# Patient Record
Sex: Male | Born: 1940 | Race: White | Hispanic: No | Marital: Married | Smoking: Former smoker
Health system: Southern US, Community
[De-identification: ages and names within clinical notes are randomized; demographics above are authoritative.]

## PROBLEM LIST (undated history)

## (undated) DIAGNOSIS — K222 Esophageal obstruction: Secondary | ICD-10-CM

## (undated) DIAGNOSIS — R7303 Prediabetes: Secondary | ICD-10-CM

## (undated) DIAGNOSIS — K449 Diaphragmatic hernia without obstruction or gangrene: Secondary | ICD-10-CM

## (undated) DIAGNOSIS — E039 Hypothyroidism, unspecified: Secondary | ICD-10-CM

## (undated) DIAGNOSIS — D649 Anemia, unspecified: Secondary | ICD-10-CM

## (undated) DIAGNOSIS — E559 Vitamin D deficiency, unspecified: Secondary | ICD-10-CM

## (undated) DIAGNOSIS — L97309 Non-pressure chronic ulcer of unspecified ankle with unspecified severity: Secondary | ICD-10-CM

## (undated) DIAGNOSIS — E538 Deficiency of other specified B group vitamins: Secondary | ICD-10-CM

## (undated) DIAGNOSIS — J449 Chronic obstructive pulmonary disease, unspecified: Secondary | ICD-10-CM

## (undated) DIAGNOSIS — M1990 Unspecified osteoarthritis, unspecified site: Secondary | ICD-10-CM

## (undated) DIAGNOSIS — K900 Celiac disease: Secondary | ICD-10-CM

## (undated) DIAGNOSIS — Z8719 Personal history of other diseases of the digestive system: Secondary | ICD-10-CM

## (undated) DIAGNOSIS — K219 Gastro-esophageal reflux disease without esophagitis: Secondary | ICD-10-CM

## (undated) DIAGNOSIS — R0602 Shortness of breath: Secondary | ICD-10-CM

## (undated) DIAGNOSIS — I10 Essential (primary) hypertension: Secondary | ICD-10-CM

## (undated) HISTORY — DX: Essential (primary) hypertension: I10

## (undated) HISTORY — DX: Prediabetes: R73.03

## (undated) HISTORY — PX: EYE SURGERY: SHX253

## (undated) HISTORY — DX: Anemia, unspecified: D64.9

## (undated) HISTORY — DX: Esophageal obstruction: K22.2

## (undated) HISTORY — DX: Diaphragmatic hernia without obstruction or gangrene: K44.9

## (undated) HISTORY — PX: APPENDECTOMY: SHX54

## (undated) HISTORY — DX: Unspecified osteoarthritis, unspecified site: M19.90

## (undated) HISTORY — DX: Deficiency of other specified B group vitamins: E53.8

## (undated) HISTORY — PX: BACK SURGERY: SHX140

## (undated) HISTORY — DX: Vitamin D deficiency, unspecified: E55.9

## (undated) HISTORY — PX: HIATAL HERNIA REPAIR: SHX195

## (undated) HISTORY — PX: TONSILLECTOMY: SUR1361

## (undated) SURGERY — Surgical Case
Anesthesia: *Unknown

---
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
## 1997-12-18 ENCOUNTER — Ambulatory Visit (HOSPITAL_COMMUNITY): Admission: RE | Admit: 1997-12-18 | Discharge: 1997-12-18 | Payer: Self-pay | Admitting: Internal Medicine

## 2002-01-21 ENCOUNTER — Encounter: Payer: Self-pay | Admitting: Orthopaedic Surgery

## 2002-01-21 ENCOUNTER — Encounter: Admission: RE | Admit: 2002-01-21 | Discharge: 2002-01-21 | Payer: Self-pay | Admitting: Orthopaedic Surgery

## 2002-01-24 ENCOUNTER — Encounter: Payer: Self-pay | Admitting: Orthopaedic Surgery

## 2002-01-29 ENCOUNTER — Encounter (INDEPENDENT_AMBULATORY_CARE_PROVIDER_SITE_OTHER): Payer: Self-pay | Admitting: *Deleted

## 2002-01-29 ENCOUNTER — Encounter: Payer: Self-pay | Admitting: Orthopaedic Surgery

## 2002-01-29 ENCOUNTER — Ambulatory Visit (HOSPITAL_COMMUNITY): Admission: RE | Admit: 2002-01-29 | Discharge: 2002-01-30 | Payer: Self-pay | Admitting: Orthopaedic Surgery

## 2003-11-11 ENCOUNTER — Ambulatory Visit (HOSPITAL_COMMUNITY): Admission: RE | Admit: 2003-11-11 | Discharge: 2003-11-11 | Payer: Self-pay | Admitting: Anesthesiology

## 2004-01-06 ENCOUNTER — Ambulatory Visit (HOSPITAL_COMMUNITY): Admission: RE | Admit: 2004-01-06 | Discharge: 2004-01-07 | Payer: Self-pay | Admitting: Orthopaedic Surgery

## 2004-01-06 ENCOUNTER — Encounter (INDEPENDENT_AMBULATORY_CARE_PROVIDER_SITE_OTHER): Payer: Self-pay | Admitting: *Deleted

## 2005-08-29 ENCOUNTER — Ambulatory Visit: Admission: RE | Admit: 2005-08-29 | Discharge: 2005-08-29 | Payer: Self-pay | Admitting: Orthopaedic Surgery

## 2006-02-09 ENCOUNTER — Ambulatory Visit (HOSPITAL_COMMUNITY): Admission: RE | Admit: 2006-02-09 | Discharge: 2006-02-09 | Payer: Self-pay | Admitting: Orthopaedic Surgery

## 2006-02-15 ENCOUNTER — Inpatient Hospital Stay (HOSPITAL_COMMUNITY): Admission: RE | Admit: 2006-02-15 | Discharge: 2006-02-16 | Payer: Self-pay | Admitting: Orthopaedic Surgery

## 2006-02-15 ENCOUNTER — Encounter (INDEPENDENT_AMBULATORY_CARE_PROVIDER_SITE_OTHER): Payer: Self-pay | Admitting: Specialist

## 2007-02-22 ENCOUNTER — Ambulatory Visit: Payer: Self-pay | Admitting: Vascular Surgery

## 2007-02-22 ENCOUNTER — Ambulatory Visit: Admission: RE | Admit: 2007-02-22 | Discharge: 2007-02-22 | Payer: Self-pay | Admitting: Orthopaedic Surgery

## 2007-02-22 ENCOUNTER — Encounter (INDEPENDENT_AMBULATORY_CARE_PROVIDER_SITE_OTHER): Payer: Self-pay | Admitting: Orthopaedic Surgery

## 2010-04-12 HISTORY — PX: JOINT REPLACEMENT: SHX530

## 2010-05-10 ENCOUNTER — Inpatient Hospital Stay (HOSPITAL_COMMUNITY): Admission: RE | Admit: 2010-05-10 | Discharge: 2010-05-13 | Payer: Self-pay | Admitting: Orthopaedic Surgery

## 2010-08-22 LAB — BASIC METABOLIC PANEL
BUN: 9 mg/dL (ref 6–23)
CO2: 29 mEq/L (ref 19–32)
CO2: 30 mEq/L (ref 19–32)
Calcium: 8 mg/dL — ABNORMAL LOW (ref 8.4–10.5)
Chloride: 95 mEq/L — ABNORMAL LOW (ref 96–112)
Chloride: 96 mEq/L (ref 96–112)
Creatinine, Ser: 0.95 mg/dL (ref 0.4–1.5)
Creatinine, Ser: 1.02 mg/dL (ref 0.4–1.5)
GFR calc Af Amer: >60 mL/min (ref >60)
GFR calc non Af Amer: >60 mL/min (ref >60)
Glucose, Bld: 128 mg/dL — ABNORMAL HIGH (ref 70–99)
Potassium: 2.8 mEq/L — ABNORMAL LOW (ref 3.5–5.1)
Potassium: 3.6 mEq/L (ref 3.5–5.1)
Sodium: 132 mEq/L — ABNORMAL LOW (ref 135–145)

## 2010-08-22 LAB — CBC
HCT: 26.4 % — ABNORMAL LOW (ref 39.0–52.0)
Hemoglobin: 8.6 g/dL — ABNORMAL LOW (ref 13.0–17.0)
MCHC: 34.5 g/dL (ref 30.0–36.0)
MCV: 87.1 fL (ref 78.0–100.0)
RDW: 15.4 % (ref 11.5–15.5)
RDW: 16 % — ABNORMAL HIGH (ref 11.5–15.5)
WBC: 5.5 10*3/uL (ref 4.0–10.5)
WBC: 5.8 10*3/uL (ref 4.0–10.5)

## 2010-08-22 LAB — PROTIME-INR: INR: 1.57 — ABNORMAL HIGH (ref 0.00–1.49)

## 2010-08-23 LAB — CBC
HCT: 34.2 % — ABNORMAL LOW (ref 39.0–52.0)
Hemoglobin: 11.8 g/dL — ABNORMAL LOW (ref 13.0–17.0)
Hemoglobin: 9.8 g/dL — ABNORMAL LOW (ref 13.0–17.0)
MCV: 88.4 fL (ref 78.0–100.0)
MCV: 89.4 fL (ref 78.0–100.0)
Platelets: 172 10*3/uL (ref 150–400)
RDW: 15.9 % — ABNORMAL HIGH (ref 11.5–15.5)
WBC: 6.1 10*3/uL (ref 4.0–10.5)

## 2010-08-23 LAB — BASIC METABOLIC PANEL
BUN: 8 mg/dL (ref 6–23)
CO2: 32 mEq/L (ref 19–32)
Calcium: 8.4 mg/dL (ref 8.4–10.5)
Calcium: 8.6 mg/dL (ref 8.4–10.5)
Chloride: 99 mEq/L (ref 96–112)
GFR calc Af Amer: >60 mL/min (ref >60)
GFR calc Af Amer: >60 mL/min (ref >60)
GFR calc non Af Amer: >60 mL/min (ref >60)
GFR calc non Af Amer: >60 mL/min (ref >60)
Glucose, Bld: 104 mg/dL — ABNORMAL HIGH (ref 70–99)
Glucose, Bld: 95 mg/dL (ref 70–99)
Potassium: 3.3 mEq/L — ABNORMAL LOW (ref 3.5–5.1)
Sodium: 138 mEq/L (ref 135–145)

## 2010-08-23 LAB — PROTIME-INR: Prothrombin Time: 15.6 seconds — ABNORMAL HIGH (ref 11.6–15.2)

## 2010-10-28 NOTE — H&P (Signed)
NAME:  [name] Fernandez, [name] Fernandez NO.:  [id number]   MEDICAL RECORD NO.:  [id number]                   PATIENT TYPE:  OIB   LOCATION:  NA                                   FACILITY:  [facility]   PHYSICIAN:  Rennis Harding, M.D.                     DATE OF BIRTH:  28-Oct-1940   DATE OF ADMISSION:  DATE OF DISCHARGE:                                HISTORY & PHYSICAL   CHIEF COMPLAINT:  Pain in my back.   HISTORY OF PRESENT ILLNESS:  This 70 year old white male was cutting his  grass at home when he had a coughing and sneezing spell. During one of these  spells, he had immediate pain into his upper mid back. This was quite  unrelenting and took his breath away.  A little later, he had a similar  episode with increased pain. Seen by Dr. Patrice Paradise who had high suspicion of  another compression fracture of his thoracic spine. He had a fracture of T10  done over the last year from a fall. A kyphoplasty procedure was performed  at that time. Since then, he has had continuing pain into his back from that  T10 fracture and now is being seen by Dr. Hardin Negus for pain control. X-rays  show wedging of a compression fracture of the T8 level. No retropulsion of  bone is seen on MRI.   PAST MEDICAL HISTORY:  The gentleman has been in relatively good health. Dr.  Melford Aase is his family physician and has cleared him for surgery.   ALLERGIES:  Allergy to SURGICAL TAPE.   CURRENT MEDICATIONS:  1. Nexium 40 mg one q.d.  2. OxyContin 40 mg q.8h.  3. Morphine 30 mg tablets every four hours p.r.n. breakthrough.   PAST SURGICAL HISTORY:  1. Bilateral herniorrhaphies in 1950.  2. Repeat hiatal hernia in 1996, 1998, 1980, and 1988 repair.  3. Kyphoplasty T10 January 29, 2002.   SOCIAL HISTORY:  The patient is married, retired. He has no intake of  alcohol or tobacco problems. He has two children.   FAMILY HISTORY:  Noncontributory.   REVIEW OF SYSTEMS:  CENTRAL NERVOUS SYSTEM:  No seizure  ___________  paralysis, numbness, or double vision. RESPIRATORY:  No productive cough. No  hemoptysis. No shortness of breath. CARDIOVASCULAR:  No chest pain. No  angina. No orthopnea. GASTROINTESTINAL:  No nausea, vomiting, melena, or  bloody stools. GENITOURINARY:  No discharge, dysuria, or hematuria.  MUSCULOSKELETAL:  Primarily in presence illness.   PHYSICAL EXAMINATION:  GENERAL:  Alert, cooperative, friendly 70 year old  white male who vital signs are:  Blood pressure 120/62, pulse 72,  respirations 12.  HEENT:  Normocephalic, atraumatic. PERRLA. EOM intact. Oropharynx clear.  CHEST:  Clear to auscultation. No rhonchi. No rales; however, he has pain  with inspiration.  HEART:  Regular rate and rhythm. No murmurs are heard.  ABDOMEN:  Soft, nontender,  liver and spleen not felt.  GENITOURINARY/RECTAL:  Not done, not pertinent to present illness.  EXTREMITIES:  No deformities and no sensory deficit.   ADMISSION DIAGNOSES:  1. Compression fracture, T8, acute.  2. TAPE ALLERGY.  3. Hiatal hernia.   PLAN:  The patient will undergo kyphoplasty of T8. Should have any medical  problems during this short hospital stay, we will certainly ask Dr. Melford Aase  to follow along with his during this patient's hospitalization.      Dooley L. Vanita Ingles.                 Rennis Harding, M.D.    DLU/MEDQ  D:  12/31/2003  T:  12/31/2003  Job:  [id number]   cc:   Unk Pinto, M.D.  [address] [place]  [place], [place] [address]  Fax: [phone]

## 2010-10-28 NOTE — Op Note (Signed)
NAME:  [name] Fernandez, [name] Fernandez                          ACCOUNT NO.:  [id number]   MEDICAL RECORD NO.:  [id number]                   PATIENT TYPE:  OIB   LOCATION:  5014                                 FACILITY:  [facility]   PHYSICIAN:  Rennis Harding, M.D.                     DATE OF BIRTH:  22-Dec-1940   DATE OF PROCEDURE:  01/06/2004  DATE OF DISCHARGE:                                 OPERATIVE REPORT   PREOPERATIVE DIAGNOSIS:  Thoracic 8 fracture.   POSTOPERATIVE DIAGNOSIS:   OPERATION/PROCEDURE:  1. Thoracic 8 kyphoplasty.  2. Deep bone biopsy, thoracic 8.  3. Fluoroscopic imaging used for kyphoplasty, thoracic 8.   SURGEON:  Rennis Harding, M.D.   ASSISTANT:  Colleen Mahar, P. A.   ANESTHESIA:  General endotracheal.   COMPLICATIONS:  None.   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  He has been in  pain management.  Recently he has developed worsening mid thoracic back  pain.  His pain management physician, Dr. Hardin Negus, order an MRI scan of the  thoracic spine, which demonstrated a pathologic fracture at T8.  Because of  his severe pain unresponsive to all conservative care he has elected to  undergo biopsy and kyphoplasty at T8 in hopes of improving his symptoms and  obtaining tissue for definitive diagnosis.   DESCRIPTION OF OPERATION:  The patient was properly identified in the  holding area and was brought to the operating room.  He underwent general  endotracheal anesthesia without difficulty.  _______ prophylactic IV in two  pockets.  He was carefully turned prone onto the [facility] table.  Bolsters  were placed under his hips and pelvis to elicit postural reduction of the  kyphosis.  Back was prepped and draped in the usual sterile fashion.  AP and  lateral fluoroscopic images were obtained of the T8 vertebral body.  Jamshidi needle was used to cannulate the left T8 pedicle with a small stab  incision.  This was done under live AP and lateral fluoroscopy.  A guidewire  was placed through the Jamshidi needle and a working cannula established  into the T8 vertebral body.  Bone biopsy was obtained and sent to pathology.  We then placed the intervertebral bone tamp through the working cannula.  The balloon was inflated up to 3 mL.  The pressure was 250 psi and ________.  The balloon tamp was removed.   We then pushed a partially hard methyl methacrylate mix through the working  cannula into the void created by the balloon tamp.  We used a total of 3 mL  of cement.  We then removed the working cannula.  Final AP and lateral  images were saved.  A single nylon suture was used to approximate the skin  edges.  Sterile dressing applied.   The patient was  turned supine, extubated without difficulty and transferred  to the recovery room in stable condition.  He was moving his upper and lower  extremities.                                               Rennis Harding, M.D.    MC/MEDQ  D:  01/06/2004  T:  01/07/2004  Job:  [id number]

## 2010-10-28 NOTE — Op Note (Signed)
NAME:  [name] Fernandez, [name] Fernandez NO.:  [id number]   MEDICAL RECORD NO.:  [id number]          PATIENT TYPE:  INP   LOCATION:  5041                         FACILITY:  [facility]   PHYSICIAN:  Rennis Harding, M.D.        DATE OF BIRTH:  1940-07-23   DATE OF PROCEDURE:  02/15/2006  DATE OF DISCHARGE:                                 OPERATIVE REPORT   DIAGNOSIS:  T11 compression fracture.   PROCEDURES:  1. T11 kyphoplasty using fluoroscopy.  2. Bone biopsy, T11 vertebral body.   SURGEON:  Rennis Harding, MD   ASSISTANT:  None.   ANESTHESIA:  General endotracheal.   ESTIMATED BLOOD LOSS:  None.   COMPLICATIONS:  None.   INDICATIONS:  The patient is a pleasant 70 year old male with severe  thoracolumbar back pain.  He has had an MRI scan, which shows a subacute  compression fracture of T11.  He has a history of previous compression  fractures, treated with kyphoplasty.  He has chronic pain and is managed on  narcotics.  Because of the acute severe nature of his pain, which has not  responded to progressive narcotics and routine conservative measures  including bracing, he now presents for kyphoplasty and biopsy to rule out  pathologic fracture.  Risk and benefits were reviewed.   PROCEDURE:  The patient was identified in the holding area after informed  consent and taken to the operating room.  He underwent general endotracheal  anesthesia without difficulty, given prophylactic IV antibiotics.  Carefully  turned prone with bolsters placed under his hips and chest to bring about  postural reduction of the kyphosis.  The back was prepped and draped in the  usual sterile fashion.  Biplanar fluoroscopy was brought into the field,  true AP and lateral views of the T11 vertebral body obtained.  A Jamshidi  needle was used to cannulate the left T11 pedicle.  A guidewire was placed  through the Jamshidi needle and a working cannula established into the bone.  We then took a bone biopsy and  sent this to pathology.  Next we inserted an  intervertebral bone tamp into the vertebral body.  The tamp was inflated to  6 mL.  The pressure remained around 100 PSI.  We observed a partial  reduction of the vertebral endplates.  We then removed the bone tamp and  pushed partially-hardened methyl methacrylate cement into the vertebral body  under live biplanar fluoroscopy.  The cement fill was stopped as the fill  moved posteriorly.  We used a total of 5 mL of cement.  Final AP and lateral  images were saved.  The working  cannula was removed.  A simple nylon suture was used on the stab incision.  Marcaine 0.5% with epinephrine 3 mL injected for postoperative analgesia.  Sterile dressing applied.  The patient was turned supine, extubated without  difficulty, transferred to recovery in stable condition able to move his  upper and lower extremities.      Rennis Harding, M.D.  Electronically Signed     MC/MEDQ  D:  02/15/2006  T:  02/16/2006  Job:  [id number]

## 2010-10-28 NOTE — Op Note (Signed)
NAME:  SHRIYAN, ARAKAWA                          ACCOUNT NO.:  [id number]   MEDICAL RECORD NO.:  [id number]                   PATIENT TYPE:  OIB   LOCATION:  2899                                 FACILITY:  [address]   PHYSICIAN:  Starling Manns, M.D.                  DATE OF BIRTH:  03/10/1941   DATE OF PROCEDURE:  01/29/2002  DATE OF DISCHARGE:                                 OPERATIVE REPORT   PREOPERATIVE DIAGNOSIS:  T10 compression fracture and chronic pain.   PROCEDURES:  1. Closed reduction of T10 fracture.  2. Deep bone biopsy, T10 vertebral body.  3. Thoracic percutaneous vertebroplasty, T10.  4. Radiologic interpretation of fluoroscopic images.   SURGEON:  Starling Manns, M.D.   ASSISTANT:  Karenann Cai, P.A.   COMPLICATIONS:  None.   ANESTHESIA:  General endotracheal.   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  He  has had a long and appropriate course of conservative therapy, including  multiple braces, pain management, muscle relaxers, and physical therapy.  Plain radiographs show a wedge of a compression deformity at T10 with local  kyphosis and loss of anterior vertebral body height.  He has had a recent  MRI scan, which showed some continued edema within the T10 body.  The risks,  benefits, and alternatives of kyphoplasty for stabilization and pain relief  and possibly reduction of the deformity were discussed extensively with the  patient, and he elected to proceed.   DESCRIPTION OF PROCEDURE:  The patient was properly identified in the  holding area and taken to the operating room.  He underwent general  endotracheal anesthesia without difficulty.  He was given prophylactic IV  antibiotics.  He was turned prone with bolsters placed horizontally across  his chest and at the level of the anterior iliac crest.  Pillows were placed  under the patient's legs to hyperextend  his hips and help reduce the  deformity.  Fluoroscopy confirmed that the patient was well-positioned and  there was partial reduction of the kyphosis.  At this point the back was  prepped and draped in the usual sterile fashion.  Biplanar fluoroscopy was  brought in and draped.  After obtaining true AP and lateral images of the  T10 vertebral body, a Jamshidi needle was utilized to cannulate the pedicle  bilaterally.  This was done under constant AP and lateral fluoroscopic  images.  We then established our working cannula into the pedicles.  At this  point a bone biopsy trocar was placed through the working cannula, and a  core of bone was biopsied and sent to the lab.  We then placed  intervertebral bone tamps through the working cannulas and inflated the tamp  on the right to a total volume of 3 cc and a pressure of 250  PSI.  On the  left we were only able to inflate the bone tamp to 1.5 cc, and at this  volume we reached a pressure of 350.  We then mixed our cement with barium,  removed the balloon tamps, and allowed the cement to partially harden before  pushing it through the working cannula into the space created with the bone  tamps.  We used live AP and lateral and fluoroscopic images to confirm good  position of the cement.  We then removed the working cannulas.  The wound  was irrigated and closed with simple #1 nylon sutures.  A sterile dressing  was applied.  The patient was then turned supine, extubated without  difficulty, able to move his upper and lower extremities, and he was  transferred to the recovery room in stable condition.                                               Starling Manns, M.D.    MWC/MEDQ  D:  01/29/2002  T:  02/01/2002  Job:  [id number]

## 2011-01-27 ENCOUNTER — Ambulatory Visit (HOSPITAL_COMMUNITY)
Admission: RE | Admit: 2011-01-27 | Discharge: 2011-01-27 | Disposition: A | Discharge status: Home / Self Care | Payer: Worker's Compensation | Source: Ambulatory Visit | Attending: Orthopedic Surgery | Admitting: Orthopedic Surgery

## 2011-01-27 DIAGNOSIS — M79609 Pain in unspecified limb: Secondary | ICD-10-CM | POA: Insufficient documentation

## 2011-01-27 DIAGNOSIS — M7989 Other specified soft tissue disorders: Secondary | ICD-10-CM | POA: Insufficient documentation

## 2011-07-20 ENCOUNTER — Other Ambulatory Visit (HOSPITAL_COMMUNITY): Payer: Self-pay | Admitting: Orthopaedic Surgery

## 2011-07-20 DIAGNOSIS — M25561 Pain in right knee: Secondary | ICD-10-CM

## 2011-07-26 ENCOUNTER — Encounter (HOSPITAL_COMMUNITY)
Admission: RE | Admit: 2011-07-26 | Discharge: 2011-07-26 | Disposition: A | Discharge status: Home / Self Care | Payer: Worker's Compensation | Source: Ambulatory Visit | Attending: Orthopaedic Surgery | Admitting: Orthopaedic Surgery

## 2011-07-26 DIAGNOSIS — R948 Abnormal results of function studies of other organs and systems: Secondary | ICD-10-CM | POA: Insufficient documentation

## 2011-07-26 DIAGNOSIS — Z1389 Encounter for screening for other disorder: Secondary | ICD-10-CM | POA: Insufficient documentation

## 2011-07-26 DIAGNOSIS — M25561 Pain in right knee: Secondary | ICD-10-CM

## 2011-07-26 MED ORDER — TECHNETIUM TC 99M MEDRONATE IV KIT
25.0000 | PACK | Freq: Once | INTRAVENOUS | Status: AC | PRN
  Administered 2011-07-26: 27 via INTRAVENOUS

## 2011-11-09 ENCOUNTER — Encounter (HOSPITAL_COMMUNITY): Payer: Self-pay | Admitting: Pharmacist

## 2011-11-14 ENCOUNTER — Encounter (HOSPITAL_COMMUNITY)
Admission: RE | Admit: 2011-11-14 | Discharge: 2011-11-14 | Disposition: A | Discharge status: Home / Self Care | Payer: Worker's Compensation | Source: Ambulatory Visit | Attending: Orthopedic Surgery | Admitting: Orthopedic Surgery

## 2011-11-14 ENCOUNTER — Encounter (HOSPITAL_COMMUNITY): Payer: Self-pay

## 2011-11-14 HISTORY — DX: Shortness of breath: R06.02

## 2011-11-14 HISTORY — DX: Hypothyroidism, unspecified: E03.9

## 2011-11-14 HISTORY — DX: Personal history of other diseases of the digestive system: Z87.19

## 2011-11-14 LAB — DIFFERENTIAL
Basophils Absolute: 0 10*3/uL (ref 0.0–0.1)
Lymphocytes Relative: 40 % (ref 12–46)
Lymphs Abs: 2.4 10*3/uL (ref 0.7–4.0)
Monocytes Absolute: 0.5 10*3/uL (ref 0.1–1.0)
Neutro Abs: 3 10*3/uL (ref 1.7–7.7)

## 2011-11-14 LAB — CBC
HCT: 32.7 % — ABNORMAL LOW (ref 39.0–52.0)
RBC: 3.96 MIL/uL — ABNORMAL LOW (ref 4.22–5.81)
RDW: 18.9 % — ABNORMAL HIGH (ref 11.5–15.5)
WBC: 6.1 10*3/uL (ref 4.0–10.5)

## 2011-11-14 LAB — URINALYSIS, ROUTINE W REFLEX MICROSCOPIC
Bilirubin Urine: NEGATIVE
Hgb urine dipstick: NEGATIVE
Ketones, ur: NEGATIVE mg/dL
Specific Gravity, Urine: 1.013 (ref 1.005–1.030)
Urobilinogen, UA: 0.2 mg/dL (ref 0.0–1.0)

## 2011-11-14 LAB — BASIC METABOLIC PANEL
CO2: 33 mEq/L — ABNORMAL HIGH (ref 19–32)
Chloride: 97 mEq/L (ref 96–112)
Creatinine, Ser: 1.11 mg/dL (ref 0.50–1.35)
GFR calc Af Amer: 76 mL/min — ABNORMAL LOW (ref >90)
Potassium: 4.2 mEq/L (ref 3.5–5.1)
Sodium: 137 mEq/L (ref 135–145)

## 2011-11-14 LAB — APTT: aPTT: 31 seconds (ref 24–37)

## 2011-11-14 LAB — PROTIME-INR: Prothrombin Time: 14.1 seconds (ref 11.6–15.2)

## 2011-11-14 LAB — SURGICAL PCR SCREEN: Staphylococcus aureus: NEGATIVE

## 2011-11-14 NOTE — Patient Instructions (Signed)
[place]  11/14/2011   Your procedure is scheduled on:  Monday 11/20/2011 at 1240pm  Report to [facility] at 1015 AM.  Call this number if you have problems the morning of surgery: [phone]   Remember:   Do not eat food:After Midnight.  May have clear liquids:until Midnight .  Marland Kitchen  Take these medicines the morning of surgery with A SIP OF WATER: Prilosec, Synthroid,Oxycodone if needed   Do not wear jewelry,   Do not wear lotions, or .powders,   Do not shave 48 hours prior to surgery. Men may shave face and neck.  Do not bring valuables to the hospital.  Contacts, dentures or bridgework may not be worn into surgery.  Leave suitcase in the car. After surgery it may be brought to your room.  For patients admitted to the hospital, checkout time is 11:00 AM the day of discharge.       Special Instructions: CHG Shower Use Special Wash: 1/2 bottle night before surgery and 1/2 bottle morning of surgery.   Please read over the following fact sheets that you were given: MRSA Information, Sleep apnea sheet, Incentive Spirometry sheet, Blood fact sheet              Any questions please call me Charmaine Downs.Corinne Goucher,RN,BSN at [phone]

## 2011-11-15 NOTE — Progress Notes (Signed)
H&P performed 11/15/11 Dictation # [id number]

## 2011-11-16 NOTE — H&P (Signed)
NAME:  [name] Fernandez, [name] Fernandez NO.:  MEDICAL RECORD NO.:  [id number]  LOCATION:                                 FACILITY:  PHYSICIAN:  Judith Part. Jennika Ringgold, P.A.DATE OF BIRTH:  04-28-1941  DATE OF ADMISSION: DATE OF DISCHARGE:                             HISTORY & PHYSICAL   DATE OF SURGERY:  November 20, 2011.  ADMITTING DIAGNOSIS:  Loose total knee arthroplasty, right knee.  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  After failure of conservative treatment and labs that do not indicate any infection the patient is now scheduled for revision total knee arthroplasty of his right knee.  The surgery, risks, benefits and aftercare were discussed in detail with the patient, questions invited and answered.  Surgery to go ahead as scheduled.  Note that he is a candidate for trans-cinnamic acid and will receive that as well as dexamethasone preop.  He plans on going home after the surgery.  He is given his home medications of aspirin, iron, Robaxin MiraLAX and Colace.  Note that his medical doctor is Dr. Unk Pinto and his chronic pain physician is Dr. Nicholaus Bloom for chronic back pain.  PAST MEDICAL HISTORY:  Drug allergies none.  CURRENT MEDICATIONS:  Oxycodone 30 mg 1 q. 4-6h. p.r.n. pain, Prilosec 20 mg 1 daily, furosemide 40 mg 1 daily, levothyroxine 75 mcg 1 every other day and Androgel hormone cream 2 mL once a day.  SERIOUS MEDICAL ILLNESSES:  Chronic pain syndrome, reflux, hiatal hernia, hypothyroidism and chronic low back pain.  PREVIOUS SURGERIES:  Right total knee arthroplasty; back and neck surgery; hiatal hernia surgery x4.  FAMILY HISTORY:  Positive for cancer.  SOCIAL HISTORY:  Patient is married.  He is retired, he worked.  He lives at home.  He does not smoke or drink.  He again plans on  going home after surgery.  REVIEW OF SYSTEMS:  CENTRAL NERVOUS SYSTEM:  Negative for headache, blurred vision, or dizziness. PULMONARY:  Negative for shortness breath, PND, orthopnea. CARDIOVASCULAR:  Negative for chest pain, palpitation. GI:  Negative for ulcers, hepatitis. GU:  Negative for urinary tract difficulty. MUSCULOSKELETAL:  Positive as in HPI.  PHYSICAL EXAMINATION:  VITAL SIGNS:  Blood pressure 143/73, pulse 76 and regular, respirations 14.  HEENT:  Head normocephalic.  Nose patent. Ears patent.  Pupils equal, round, reactive to light.  Throat without injection. NECK:  Supple without adenopathy.  Carotids 2+ without bruit. CHEST:  Clear to auscultation.  No rales or rhonchi.  Respirations 14. HEART:  Regular rate and rhythm at 76 beats per minute without murmur. ABDOMEN:  Soft, active bowel sounds, no masses or organomegaly. NEUROLOGIC:  Patient alert, oriented to time, place and person.  Cranial nerves 2-12 grossly intact. EXTREMITIES:  Shows the right knee to have some moderate soft tissue swelling.  He has full extension, further flexion to 120 degrees.  Mild medial lateral instability.  Sensation and circulation are intact.  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  PLAN:  Revision total knee arthroplasty on the right.     Judith Part. Amado Coe.     SJC/MEDQ  D:  11/15/2011  T:  11/16/2011  Job:  [id number]

## 2011-11-20 ENCOUNTER — Encounter (HOSPITAL_COMMUNITY): Payer: Self-pay | Admitting: Anesthesiology

## 2011-11-20 ENCOUNTER — Inpatient Hospital Stay (HOSPITAL_COMMUNITY)
Admission: RE | Admit: 2011-11-20 | Discharge: 2011-11-22 | DRG: 468 | Disposition: A | Discharge status: Home Health | Payer: Worker's Compensation | Source: Ambulatory Visit | Attending: Orthopedic Surgery | Admitting: Orthopedic Surgery

## 2011-11-20 ENCOUNTER — Encounter (HOSPITAL_COMMUNITY): Payer: Self-pay | Admitting: *Deleted

## 2011-11-20 ENCOUNTER — Encounter (HOSPITAL_COMMUNITY)
Admission: RE | Disposition: A | Discharge status: Home Health | Payer: Self-pay | Source: Ambulatory Visit | Attending: Orthopedic Surgery

## 2011-11-20 ENCOUNTER — Ambulatory Visit (HOSPITAL_COMMUNITY): Payer: Worker's Compensation | Admitting: Anesthesiology

## 2011-11-20 DIAGNOSIS — E039 Hypothyroidism, unspecified: Secondary | ICD-10-CM | POA: Diagnosis present

## 2011-11-20 DIAGNOSIS — G8929 Other chronic pain: Secondary | ICD-10-CM | POA: Diagnosis present

## 2011-11-20 DIAGNOSIS — T84039A Mechanical loosening of unspecified internal prosthetic joint, initial encounter: Principal | ICD-10-CM | POA: Diagnosis present

## 2011-11-20 DIAGNOSIS — K219 Gastro-esophageal reflux disease without esophagitis: Secondary | ICD-10-CM | POA: Diagnosis present

## 2011-11-20 DIAGNOSIS — Y831 Surgical operation with implant of artificial internal device as the cause of abnormal reaction of the patient, or of later complication, without mention of misadventure at the time of the procedure: Secondary | ICD-10-CM | POA: Diagnosis present

## 2011-11-20 DIAGNOSIS — M545 Low back pain, unspecified: Secondary | ICD-10-CM | POA: Diagnosis present

## 2011-11-20 DIAGNOSIS — Z96659 Presence of unspecified artificial knee joint: Secondary | ICD-10-CM

## 2011-11-20 HISTORY — PX: TOTAL KNEE REVISION: SHX996

## 2011-11-20 LAB — TYPE AND SCREEN: Antibody Screen: NEGATIVE

## 2011-11-20 SURGERY — TOTAL KNEE REVISION
Anesthesia: General | Site: Knee | Laterality: Right | Wound class: Clean

## 2011-11-20 MED ORDER — LACTATED RINGERS IV SOLN
INTRAVENOUS | Status: DC
  Administered 2011-11-20: 1000 mL via INTRAVENOUS
  Administered 2011-11-20 (×2): via INTRAVENOUS

## 2011-11-20 MED ORDER — METHOCARBAMOL 500 MG PO TABS
500.0000 mg | ORAL_TABLET | Freq: Four times a day (QID) | ORAL | Status: DC | PRN
  Administered 2011-11-21 – 2011-11-22 (×3): 500 mg via ORAL
  Filled 2011-11-20 (×3): qty 1

## 2011-11-20 MED ORDER — ONDANSETRON HCL 4 MG/2ML IJ SOLN: 4.0000 mg | Freq: Four times a day (QID) | INTRAMUSCULAR | Status: DC | PRN

## 2011-11-20 MED ORDER — ALUM & MAG HYDROXIDE-SIMETH 200-200-20 MG/5ML PO SUSP
30.0000 mL | ORAL | Status: DC | PRN
  Administered 2011-11-21 (×2): 30 mL via ORAL
  Filled 2011-11-20 (×2): qty 30

## 2011-11-20 MED ORDER — FUROSEMIDE 40 MG PO TABS
40.0000 mg | ORAL_TABLET | Freq: Every day | ORAL | Status: DC
  Administered 2011-11-20 – 2011-11-22 (×3): 40 mg via ORAL
  Filled 2011-11-20 (×3): qty 1

## 2011-11-20 MED ORDER — DOCUSATE SODIUM 100 MG PO CAPS
100.0000 mg | ORAL_CAPSULE | Freq: Two times a day (BID) | ORAL | Status: DC
  Administered 2011-11-20 – 2011-11-22 (×4): 100 mg via ORAL

## 2011-11-20 MED ORDER — HYDROMORPHONE HCL PF 1 MG/ML IJ SOLN
INTRAMUSCULAR | Status: AC
  Filled 2011-11-20: qty 1

## 2011-11-20 MED ORDER — HYDROMORPHONE HCL PF 1 MG/ML IJ SOLN
0.5000 mg | INTRAMUSCULAR | Status: DC | PRN
  Administered 2011-11-20 (×2): 2 mg via INTRAVENOUS
  Administered 2011-11-21: 1 mg via INTRAVENOUS
  Filled 2011-11-20: qty 2
  Filled 2011-11-20: qty 1
  Filled 2011-11-20: qty 2

## 2011-11-20 MED ORDER — MENTHOL 3 MG MT LOZG
1.0000 | LOZENGE | OROMUCOSAL | Status: DC | PRN
  Filled 2011-11-20: qty 9

## 2011-11-20 MED ORDER — FERROUS SULFATE 325 (65 FE) MG PO TABS
325.0000 mg | ORAL_TABLET | Freq: Three times a day (TID) | ORAL | Status: DC
  Administered 2011-11-21 – 2011-11-22 (×5): 325 mg via ORAL
  Filled 2011-11-20 (×8): qty 1

## 2011-11-20 MED ORDER — FLEET ENEMA 7-19 GM/118ML RE ENEM: 1.0000 | ENEMA | Freq: Once | RECTAL | Status: AC | PRN

## 2011-11-20 MED ORDER — BUPIVACAINE-EPINEPHRINE PF 0.25-1:200000 % IJ SOLN
INTRAMUSCULAR | Status: AC
  Filled 2011-11-20: qty 60

## 2011-11-20 MED ORDER — CEFAZOLIN SODIUM-DEXTROSE 2-3 GM-% IV SOLR
INTRAVENOUS | Status: AC
  Filled 2011-11-20: qty 50

## 2011-11-20 MED ORDER — SODIUM CHLORIDE 0.9 % IV SOLN
INTRAVENOUS | Status: AC
  Administered 2011-11-20 – 2011-11-21 (×2): via INTRAVENOUS
  Filled 2011-11-20 (×4): qty 1000

## 2011-11-20 MED ORDER — HYDROMORPHONE HCL PF 1 MG/ML IJ SOLN
INTRAMUSCULAR | Status: DC | PRN
  Administered 2011-11-20 (×4): 0.5 mg via INTRAVENOUS

## 2011-11-20 MED ORDER — FENTANYL CITRATE 0.05 MG/ML IJ SOLN
INTRAMUSCULAR | Status: AC
  Filled 2011-11-20: qty 2

## 2011-11-20 MED ORDER — CEFAZOLIN SODIUM-DEXTROSE 2-3 GM-% IV SOLR
2.0000 g | INTRAVENOUS | Status: AC
  Administered 2011-11-20: 2 g via INTRAVENOUS

## 2011-11-20 MED ORDER — ROPIVACAINE HCL 5 MG/ML IJ SOLN
INTRAMUSCULAR | Status: DC | PRN
  Administered 2011-11-20: 30 mL

## 2011-11-20 MED ORDER — ONDANSETRON HCL 4 MG PO TABS: 4.0000 mg | ORAL_TABLET | Freq: Four times a day (QID) | ORAL | Status: DC | PRN

## 2011-11-20 MED ORDER — ONDANSETRON HCL 4 MG/2ML IJ SOLN
INTRAMUSCULAR | Status: DC | PRN
  Administered 2011-11-20: 4 mg via INTRAVENOUS

## 2011-11-20 MED ORDER — OXYCODONE HCL 5 MG PO TABS
20.0000 mg | ORAL_TABLET | ORAL | Status: DC
  Administered 2011-11-20 – 2011-11-22 (×12): 20 mg via ORAL
  Filled 2011-11-20 (×12): qty 4

## 2011-11-20 MED ORDER — BUPIVACAINE-EPINEPHRINE PF 0.25-1:200000 % IJ SOLN
INTRAMUSCULAR | Status: AC
  Filled 2011-11-20: qty 30

## 2011-11-20 MED ORDER — ZOLPIDEM TARTRATE 5 MG PO TABS: 5.0000 mg | ORAL_TABLET | Freq: Every evening | ORAL | Status: DC | PRN

## 2011-11-20 MED ORDER — NEOSTIGMINE METHYLSULFATE 1 MG/ML IJ SOLN
INTRAMUSCULAR | Status: DC | PRN
  Administered 2011-11-20: 3 mg via INTRAVENOUS

## 2011-11-20 MED ORDER — DIPHENHYDRAMINE HCL 25 MG PO CAPS: 25.0000 mg | ORAL_CAPSULE | Freq: Four times a day (QID) | ORAL | Status: DC | PRN

## 2011-11-20 MED ORDER — ROPIVACAINE HCL 5 MG/ML IJ SOLN
INTRAMUSCULAR | Status: AC
  Filled 2011-11-20: qty 30

## 2011-11-20 MED ORDER — METOCLOPRAMIDE HCL 5 MG/ML IJ SOLN: 5.0000 mg | Freq: Three times a day (TID) | INTRAMUSCULAR | Status: DC | PRN

## 2011-11-20 MED ORDER — METHOCARBAMOL 100 MG/ML IJ SOLN
500.0000 mg | Freq: Four times a day (QID) | INTRAVENOUS | Status: DC | PRN
  Administered 2011-11-20 – 2011-11-21 (×2): 500 mg via INTRAVENOUS
  Filled 2011-11-20 (×2): qty 5

## 2011-11-20 MED ORDER — MIDAZOLAM HCL 5 MG/5ML IJ SOLN
INTRAMUSCULAR | Status: DC | PRN
  Administered 2011-11-20 (×2): 1 mg via INTRAVENOUS

## 2011-11-20 MED ORDER — PROPOFOL 10 MG/ML IV BOLUS
INTRAVENOUS | Status: DC | PRN
  Administered 2011-11-20 (×2): 10 mg via INTRAVENOUS
  Administered 2011-11-20: 170 mg via INTRAVENOUS
  Administered 2011-11-20: 10 mg via INTRAVENOUS

## 2011-11-20 MED ORDER — KETOROLAC TROMETHAMINE 15 MG/ML IJ SOLN
15.0000 mg | Freq: Four times a day (QID) | INTRAMUSCULAR | Status: AC
  Administered 2011-11-20 – 2011-11-21 (×4): 15 mg via INTRAVENOUS
  Filled 2011-11-20 (×5): qty 1

## 2011-11-20 MED ORDER — LEVOTHYROXINE SODIUM 50 MCG PO TABS
50.0000 ug | ORAL_TABLET | Freq: Every day | ORAL | Status: DC
  Filled 2011-11-20 (×2): qty 1

## 2011-11-20 MED ORDER — TRANEXAMIC ACID 100 MG/ML IV SOLN
15.0000 mg/kg | Freq: Once | INTRAVENOUS | Status: AC
  Administered 2011-11-20: 1200 mg via INTRAVENOUS
  Filled 2011-11-20: qty 12

## 2011-11-20 MED ORDER — ROCURONIUM BROMIDE 100 MG/10ML IV SOLN
INTRAVENOUS | Status: DC | PRN
  Administered 2011-11-20: 50 mg via INTRAVENOUS

## 2011-11-20 MED ORDER — SODIUM CHLORIDE 0.9 % IR SOLN
Status: DC | PRN
  Administered 2011-11-20: 3000 mL

## 2011-11-20 MED ORDER — PANTOPRAZOLE SODIUM 40 MG PO TBEC
40.0000 mg | DELAYED_RELEASE_TABLET | Freq: Every day | ORAL | Status: DC
  Administered 2011-11-21: 40 mg via ORAL
  Filled 2011-11-20 (×2): qty 1

## 2011-11-20 MED ORDER — PHENOL 1.4 % MT LIQD: 1.0000 | OROMUCOSAL | Status: DC | PRN

## 2011-11-20 MED ORDER — DEXAMETHASONE SODIUM PHOSPHATE 10 MG/ML IJ SOLN
10.0000 mg | Freq: Once | INTRAMUSCULAR | Status: AC
  Administered 2011-11-21: 10 mg via INTRAVENOUS
  Filled 2011-11-20: qty 1

## 2011-11-20 MED ORDER — RIVAROXABAN 10 MG PO TABS
10.0000 mg | ORAL_TABLET | ORAL | Status: DC
  Administered 2011-11-21 – 2011-11-22 (×2): 10 mg via ORAL
  Filled 2011-11-20 (×4): qty 1

## 2011-11-20 MED ORDER — KETAMINE HCL 10 MG/ML IJ SOLN
INTRAMUSCULAR | Status: DC | PRN
  Administered 2011-11-20: 20 mg via INTRAVENOUS
  Administered 2011-11-20 (×2): 10 mg via INTRAVENOUS

## 2011-11-20 MED ORDER — KETOROLAC TROMETHAMINE 30 MG/ML IJ SOLN
INTRAMUSCULAR | Status: AC
  Filled 2011-11-20: qty 1

## 2011-11-20 MED ORDER — ACETAMINOPHEN 10 MG/ML IV SOLN
1000.0000 mg | Freq: Four times a day (QID) | INTRAVENOUS | Status: AC
  Administered 2011-11-20 – 2011-11-21 (×4): 1000 mg via INTRAVENOUS
  Filled 2011-11-20 (×6): qty 100

## 2011-11-20 MED ORDER — OXYCODONE HCL 15 MG PO TB12
30.0000 mg | ORAL_TABLET | Freq: Two times a day (BID) | ORAL | Status: DC
  Administered 2011-11-20 – 2011-11-22 (×4): 30 mg via ORAL
  Filled 2011-11-20 (×4): qty 2

## 2011-11-20 MED ORDER — BISACODYL 5 MG PO TBEC: 5.0000 mg | DELAYED_RELEASE_TABLET | Freq: Every day | ORAL | Status: DC | PRN

## 2011-11-20 MED ORDER — ACETAMINOPHEN 10 MG/ML IV SOLN
INTRAVENOUS | Status: DC | PRN
  Administered 2011-11-20: 1000 mg via INTRAVENOUS

## 2011-11-20 MED ORDER — PROMETHAZINE HCL 25 MG/ML IJ SOLN: 6.2500 mg | INTRAMUSCULAR | Status: DC | PRN

## 2011-11-20 MED ORDER — POLYETHYLENE GLYCOL 3350 17 G PO PACK
17.0000 g | PACK | Freq: Two times a day (BID) | ORAL | Status: DC
  Administered 2011-11-20 – 2011-11-22 (×3): 17 g via ORAL

## 2011-11-20 MED ORDER — CEFAZOLIN SODIUM-DEXTROSE 2-3 GM-% IV SOLR
2.0000 g | Freq: Four times a day (QID) | INTRAVENOUS | Status: AC
  Administered 2011-11-20 (×2): 2 g via INTRAVENOUS
  Filled 2011-11-20 (×2): qty 50

## 2011-11-20 MED ORDER — ACETAMINOPHEN 10 MG/ML IV SOLN
INTRAVENOUS | Status: AC
  Filled 2011-11-20: qty 100

## 2011-11-20 MED ORDER — HYDROMORPHONE HCL PF 1 MG/ML IJ SOLN
0.2500 mg | INTRAMUSCULAR | Status: DC | PRN
  Administered 2011-11-20 (×4): 0.5 mg via INTRAVENOUS

## 2011-11-20 MED ORDER — FENTANYL CITRATE 0.05 MG/ML IJ SOLN
25.0000 ug | INTRAMUSCULAR | Status: DC | PRN
  Administered 2011-11-20 (×2): 25 ug via INTRAVENOUS

## 2011-11-20 MED ORDER — LIDOCAINE HCL (CARDIAC) 20 MG/ML IV SOLN
INTRAVENOUS | Status: DC | PRN
  Administered 2011-11-20: 75 mg via INTRAVENOUS

## 2011-11-20 MED ORDER — GLYCOPYRROLATE 0.2 MG/ML IJ SOLN
INTRAMUSCULAR | Status: DC | PRN
  Administered 2011-11-20: 0.4 mg via INTRAVENOUS

## 2011-11-20 MED ORDER — 0.9 % SODIUM CHLORIDE (POUR BTL) OPTIME
TOPICAL | Status: DC | PRN
  Administered 2011-11-20: 1000 mL

## 2011-11-20 MED ORDER — METOCLOPRAMIDE HCL 10 MG PO TABS: 5.0000 mg | ORAL_TABLET | Freq: Three times a day (TID) | ORAL | Status: DC | PRN

## 2011-11-20 MED ORDER — FENTANYL CITRATE 0.05 MG/ML IJ SOLN
INTRAMUSCULAR | Status: DC | PRN
  Administered 2011-11-20: 50 ug via INTRAVENOUS
  Administered 2011-11-20: 100 ug via INTRAVENOUS
  Administered 2011-11-20 (×6): 50 ug via INTRAVENOUS

## 2011-11-20 SURGICAL SUPPLY — 85 items
ADAPTER BOLT FEMORAL +2/-2 (Knees) ×1 IMPLANT
ADH SKN CLS APL DERMABOND .7 (GAUZE/BANDAGES/DRESSINGS) ×1
ADPR FEM +2/-2 OFST BOLT (Knees) ×1 IMPLANT
ADPR FEM 5D STRL KN PFC SGM (Orthopedic Implant) ×1 IMPLANT
AUG FEM SZ5 4 CMB POST STRL LF (Orthopedic Implant) ×2 IMPLANT
AUG TIB SZ5 5 REV STP WDG STRL (Knees) ×1 IMPLANT
AUGMENT FMRL POST PFC 4MM SZ5 (Orthopedic Implant) IMPLANT
BAG SPEC THK2 15X12 ZIP CLS (MISCELLANEOUS) ×1
BAG ZIPLOCK 12X15 (MISCELLANEOUS) ×2 IMPLANT
BANDAGE ELASTIC 6 VELCRO ST LF (GAUZE/BANDAGES/DRESSINGS) ×2 IMPLANT
BANDAGE ESMARK 6X9 LF (GAUZE/BANDAGES/DRESSINGS) ×1 IMPLANT
BIT DRILL 2.8X128 (BIT) ×1 IMPLANT
BLADE SAW SGTL 13.0X1.19X90.0M (BLADE) ×2 IMPLANT
BLADE SAW SGTL 81X20 HD (BLADE) ×2 IMPLANT
BNDG CMPR 9X6 STRL LF SNTH (GAUZE/BANDAGES/DRESSINGS) ×1
BNDG ESMARK 6X9 LF (GAUZE/BANDAGES/DRESSINGS) ×2
BONE CEMENT GENTAMICIN (Cement) ×6 IMPLANT
BOWL SMART MIX CTS (DISPOSABLE) ×1 IMPLANT
CEMENT BONE GENTAMICIN 40 (Cement) IMPLANT
CEMENT RESTRICTOR DEPUY SZ 4 (Cement) ×1 IMPLANT
CLOTH BEACON ORANGE TIMEOUT ST (SAFETY) ×2 IMPLANT
CUFF TOURN SGL QUICK 34 (TOURNIQUET CUFF) ×2
CUFF TRNQT CYL 34X4X40X1 (TOURNIQUET CUFF) ×1 IMPLANT
DERMABOND ADVANCED (GAUZE/BANDAGES/DRESSINGS) ×1
DERMABOND ADVANCED .7 DNX12 (GAUZE/BANDAGES/DRESSINGS) ×1 IMPLANT
DRAPE EXTREMITY T 121X128X90 (DRAPE) ×2 IMPLANT
DRAPE POUCH INSTRU U-SHP 10X18 (DRAPES) ×2 IMPLANT
DRAPE U-SHAPE 47X51 STRL (DRAPES) ×2 IMPLANT
DRSG ADAPTIC 3X8 NADH LF (GAUZE/BANDAGES/DRESSINGS) ×2 IMPLANT
DRSG AQUACEL AG ADV 3.5X10 (GAUZE/BANDAGES/DRESSINGS) ×2 IMPLANT
DRSG AQUACEL AG ADV 3.5X14 (GAUZE/BANDAGES/DRESSINGS) ×1 IMPLANT
DRSG PAD ABDOMINAL 8X10 ST (GAUZE/BANDAGES/DRESSINGS) ×2 IMPLANT
DRSG TEGADERM 4X4.75 (GAUZE/BANDAGES/DRESSINGS) ×2 IMPLANT
DURAPREP 26ML APPLICATOR (WOUND CARE) ×2 IMPLANT
ELECT REM PT RETURN 9FT ADLT (ELECTROSURGICAL) ×2
ELECTRODE REM PT RTRN 9FT ADLT (ELECTROSURGICAL) ×1 IMPLANT
EVACUATOR 1/8 PVC DRAIN (DRAIN) ×2 IMPLANT
FACESHIELD LNG OPTICON STERILE (SAFETY) ×10 IMPLANT
FEM POST AUG PFC 4MM SZ5 (Orthopedic Implant) ×4 IMPLANT
FEM TC3 PFC SIGMA SZ5 (Orthopedic Implant) ×2 IMPLANT
FEMORAL ADAPTER (Orthopedic Implant) ×1 IMPLANT
FEMORAL TC3 PFC SIGMA SZ5 (Orthopedic Implant) IMPLANT
GAUZE SPONGE 2X2 8PLY STRL LF (GAUZE/BANDAGES/DRESSINGS) ×1 IMPLANT
GLOVE BIOGEL PI IND STRL 7.5 (GLOVE) ×1 IMPLANT
GLOVE BIOGEL PI IND STRL 8 (GLOVE) ×2 IMPLANT
GLOVE BIOGEL PI INDICATOR 7.5 (GLOVE) ×1
GLOVE BIOGEL PI INDICATOR 8 (GLOVE) ×2
GLOVE ORTHO TXT STRL SZ7.5 (GLOVE) ×4 IMPLANT
GLOVE SURG ORTHO 8.0 STRL STRW (GLOVE) ×2 IMPLANT
GOWN BRE IMP PREV XXLGXLNG (GOWN DISPOSABLE) ×4 IMPLANT
GOWN STRL NON-REIN LRG LVL3 (GOWN DISPOSABLE) ×3 IMPLANT
HANDPIECE INTERPULSE COAX TIP (DISPOSABLE) ×2
IMMOBILIZER KNEE 20 (SOFTGOODS) ×2
IMMOBILIZER KNEE 20 THIGH 36 (SOFTGOODS) IMPLANT
KIT BASIN OR (CUSTOM PROCEDURE TRAY) ×2 IMPLANT
MANIFOLD NEPTUNE II (INSTRUMENTS) ×2 IMPLANT
NDL SAFETY ECLIPSE 18X1.5 (NEEDLE) ×1 IMPLANT
NEEDLE HYPO 18GX1.5 SHARP (NEEDLE) ×2
NS IRRIG 1000ML POUR BTL (IV SOLUTION) ×2 IMPLANT
PACK TOTAL JOINT (CUSTOM PROCEDURE TRAY) ×2 IMPLANT
PADDING CAST COTTON 6X4 STRL (CAST SUPPLIES) ×3 IMPLANT
PLATE ROT INSERT 12.5MM (Plate) ×1 IMPLANT
POSITIONER SURGICAL ARM (MISCELLANEOUS) ×2 IMPLANT
RESTRICTOR CEMENT SZ 5 C-STEM (Cement) ×2 IMPLANT
SET HNDPC FAN SPRY TIP SCT (DISPOSABLE) ×1 IMPLANT
SET PAD KNEE POSITIONER (MISCELLANEOUS) ×2 IMPLANT
SPONGE GAUZE 2X2 STER 10/PKG (GAUZE/BANDAGES/DRESSINGS) ×1
SPONGE GAUZE 4X4 12PLY (GAUZE/BANDAGES/DRESSINGS) ×3 IMPLANT
SPONGE LAP 18X18 X RAY DECT (DISPOSABLE) ×2 IMPLANT
STAPLER VISISTAT 35W (STAPLE) IMPLANT
STEM TIBIA PFC 13X30MM (Stem) ×2 IMPLANT
SUCTION FRAZIER 12FR DISP (SUCTIONS) ×2 IMPLANT
SUT VIC AB 1 CT1 36 (SUTURE) ×6 IMPLANT
SUT VIC AB 2-0 CT1 27 (SUTURE) ×6
SUT VIC AB 2-0 CT1 TAPERPNT 27 (SUTURE) ×3 IMPLANT
SUT VLOC 180 0 24IN GS25 (SUTURE) ×1 IMPLANT
SYR 50ML LL SCALE MARK (SYRINGE) ×2 IMPLANT
TOWEL OR 17X26 10 PK STRL BLUE (TOWEL DISPOSABLE) ×4 IMPLANT
TOWER CARTRIDGE SMART MIX (DISPOSABLE) ×2 IMPLANT
TRAY FOLEY CATH 14FRSI W/METER (CATHETERS) ×2 IMPLANT
TRAY SLEEVE CEM ML (Knees) ×1 IMPLANT
TRAY TIB SZ 5 REVISION (Knees) ×1 IMPLANT
WATER STERILE IRR 1500ML POUR (IV SOLUTION) ×2 IMPLANT
WEDGE SZ 5 5MM (Knees) ×1 IMPLANT
WRAP KNEE MAXI GEL POST OP (GAUZE/BANDAGES/DRESSINGS) ×2 IMPLANT

## 2011-11-20 NOTE — Brief Op Note (Signed)
11/20/2011  [facility]  MRN:  [id number] CSN: [id number]  2:49 PM  PATIENT:  [name] Fernandez  71 y.o. male  PRE-OPERATIVE DIAGNOSIS:  Aseptic Failure Right Total Knee  POST-OPERATIVE DIAGNOSIS:  Aseptic Failure Right Total Knee  PROCEDURE:  Procedure(s) (LRB): RIGHT TOTAL KNEE REVISION (Right)  SURGEON:  Surgeon(s) and Role:    * Mauri Pole, MD - Primary  PHYSICIAN ASSISTANT: Danae Orleans, PA-C   ANESTHESIA:   regional (FNB) and general  EBL:  Total I/O In: 2000 [I.V.:2000] Out: 450 [Urine:250; Blood:200]  BLOOD ADMINISTERED:none  DRAINS: (1 Medium) Hemovact drain(s) in the right knee with  Suction Open   LOCAL MEDICATIONS USED:  NONE  SPECIMEN:  No Specimen  DISPOSITION OF SPECIMEN:  N/A  COUNTS:  YES  TOURNIQUET:   Total Tourniquet Time Documented: Thigh (Right) - 83 minutes  DICTATION: .Other Dictation: Dictation Number [id number]  PLAN OF CARE: Admit to inpatient   PATIENT DISPOSITION:  PACU - hemodynamically stable.   Delay start of Pharmacological VTE agent (>24hrs) due to surgical blood loss or risk of bleeding: no

## 2011-11-20 NOTE — Anesthesia Preprocedure Evaluation (Addendum)
Anesthesia Evaluation  Patient identified by MRN, date of birth, ID band Patient awake    Reviewed: Allergy & Precautions, H&P , NPO status , Patient's Chart, lab work & pertinent test results  Airway Mallampati: II TM Distance: >3 FB Neck ROM: Full    Dental No notable dental hx.    Pulmonary shortness of breath,  breath sounds clear to auscultation  Pulmonary exam normal       Cardiovascular negative cardio ROS  Rhythm:Regular Rate:Normal     Neuro/Psych negative neurological ROS  negative psych ROS   GI/Hepatic Neg liver ROS, hiatal hernia,   Endo/Other  Hypothyroidism   Renal/GU negative Renal ROS  negative genitourinary   Musculoskeletal negative musculoskeletal ROS (+)   Abdominal   Peds negative pediatric ROS (+)  Hematology negative hematology ROS (+)   Anesthesia Other Findings   Reproductive/Obstetrics negative OB ROS                           Anesthesia Physical Anesthesia Plan  ASA: II  Anesthesia Plan: General   Post-op Pain Management:    Induction: Intravenous  Airway Management Planned:   Additional Equipment:   Intra-op Plan:   Post-operative Plan:   Informed Consent: I have reviewed the patients History and Physical, chart, labs and discussed the procedure including the risks, benefits and alternatives for the proposed anesthesia with the patient or authorized representative who has indicated his/her understanding and acceptance.   Dental advisory given  Plan Discussed with: CRNA  Anesthesia Plan Comments: (Discussed risks/benefits of general with femoral nerve block versus spinal. Patient prefers general with FNB which he had for original knee arthroplasty.Discussed risks of femoral nerve block including failure, bleeding, infection, nerve damage.  Femoral nerve block does not usually prevent all pain. Specifically, it treats the anterior, but often not the  posterior knee. Questions answered.  Patient consents to block. )       Anesthesia Quick Evaluation

## 2011-11-20 NOTE — Interval H&P Note (Signed)
History and Physical Interval Note:  11/20/2011 10:50 AM  [name] Fernandez  has presented today for surgery, with the diagnosis of Aseptic Failure Right Total Knee  The various methods of treatment have been discussed with the patient and family. After consideration of risks, benefits and other options for treatment, the patient has consented to  Procedure(s) (LRB):  RIGHT  TOTAL KNEE REVISION (Right) as a surgical intervention .  The patients' history has been reviewed, patient examined, no change in status, stable for surgery.  I have reviewed the patients' chart and labs.  Questions were answered to the patient's satisfaction.     Mauri Pole

## 2011-11-20 NOTE — Transfer of Care (Signed)
Immediate Anesthesia Transfer of Care Note  Patient: [name] Fernandez  Procedure(s) Performed: Procedure(s) (LRB): TOTAL KNEE REVISION (Right)  Patient Location: PACU  Anesthesia Type: GA combined with regional for post-op pain  Level of Consciousness: awake, alert , oriented and patient cooperative  Airway & Oxygen Therapy: Patient Spontanous Breathing and Patient connected to face mask oxygen  Post-op Assessment: Report given to PACU RN, Post -op Vital signs reviewed and stable and Patient moving all extremities  Post vital signs: Reviewed and stable  Complications: No apparent anesthesia complications

## 2011-11-20 NOTE — Anesthesia Procedure Notes (Signed)
Anesthesia Regional Block:  Femoral nerve block  Pre-Anesthetic Checklist: ,, timeout performed, Correct Patient, Correct Site, Correct Laterality, Correct Procedure, Correct Position, site marked, Risks and benefits discussed,  Surgical consent,  Pre-op evaluation,  At surgeon's request and post-op pain management  Laterality: Right and Lower  Prep: chloraprep       Needles:  Injection technique: Single-shot  Needle Type: Stimulator Needle - 80      Needle Gauge: 22 and 22 G    Additional Needles:  Procedures: ultrasound guided and nerve stimulator Femoral nerve block  Nerve Stimulator or Paresthesia:  Response: quadriceps, 0.6 mA,   Additional Responses:   Narrative:   Performed by: Personally  Anesthesiologist: Paxton Binns  Additional Notes: No pain on injection. No increased resistance to injection. Motor intact immediately after block. Loss of quadriceps strength at 20 minutes.

## 2011-11-21 LAB — CBC
Hemoglobin: 8.1 g/dL — ABNORMAL LOW (ref 13.0–17.0)
MCH: 26.6 pg (ref 26.0–34.0)
MCHC: 32.5 g/dL (ref 30.0–36.0)
MCV: 81.9 fL (ref 78.0–100.0)
Platelets: 357 10*3/uL (ref 150–400)

## 2011-11-21 LAB — BASIC METABOLIC PANEL
Calcium: 8.2 mg/dL — ABNORMAL LOW (ref 8.4–10.5)
Creatinine, Ser: 1.19 mg/dL (ref 0.50–1.35)
GFR calc non Af Amer: 60 mL/min — ABNORMAL LOW (ref >90)
Glucose, Bld: 109 mg/dL — ABNORMAL HIGH (ref 70–99)
Sodium: 135 mEq/L (ref 135–145)

## 2011-11-21 MED ORDER — LEVOTHYROXINE SODIUM 75 MCG PO TABS
75.0000 ug | ORAL_TABLET | Freq: Every day | ORAL | Status: DC
  Filled 2011-11-21: qty 1

## 2011-11-21 MED ORDER — POLYETHYLENE GLYCOL 3350 17 G PO PACK: 17.0000 g | PACK | Freq: Two times a day (BID) | ORAL | Status: AC

## 2011-11-21 MED ORDER — LEVOTHYROXINE SODIUM 75 MCG PO TABS
75.0000 ug | ORAL_TABLET | Freq: Every day | ORAL | Status: DC
  Administered 2011-11-21 – 2011-11-22 (×2): 75 ug via ORAL
  Filled 2011-11-21 (×4): qty 1

## 2011-11-21 MED ORDER — DSS 100 MG PO CAPS: 100.0000 mg | ORAL_CAPSULE | Freq: Two times a day (BID) | ORAL | Status: AC

## 2011-11-21 MED ORDER — ASPIRIN EC 325 MG PO TBEC: 325.0000 mg | DELAYED_RELEASE_TABLET | Freq: Two times a day (BID) | ORAL | Status: AC

## 2011-11-21 MED ORDER — METHOCARBAMOL 500 MG PO TABS: 500.0000 mg | ORAL_TABLET | Freq: Four times a day (QID) | ORAL | Status: AC | PRN

## 2011-11-21 MED ORDER — OMEPRAZOLE 20 MG PO CPDR
20.0000 mg | DELAYED_RELEASE_CAPSULE | Freq: Every day | ORAL | Status: DC
  Administered 2011-11-21 – 2011-11-22 (×2): 20 mg via ORAL
  Filled 2011-11-21 (×2): qty 1

## 2011-11-21 MED ORDER — FERROUS SULFATE 325 (65 FE) MG PO TABS: 325.0000 mg | ORAL_TABLET | Freq: Three times a day (TID) | ORAL | Status: DC

## 2011-11-21 MED ORDER — DIPHENHYDRAMINE HCL 25 MG PO CAPS: 25.0000 mg | ORAL_CAPSULE | Freq: Four times a day (QID) | ORAL | Status: DC | PRN

## 2011-11-21 NOTE — Discharge Summary (Signed)
Physician Discharge Summary  Patient ID: [name] Fernandez MRN: [id number] DOB/AGE: [birth year] 71 y.o.  Admit date: 11/20/2011 Discharge date: 11/22/2011   Procedures:  Procedure(s) (LRB): TOTAL KNEE REVISION (Right)  Attending Physician:  Dr. 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 After failure of conservative treatment and labs that do not indicate any infection the patient is now scheduled for revision total knee arthroplasty of his right knee. The surgery, risks, benefits and aftercare were discussed in detail with the patient, questions invited and answered. Surgery to go ahead as scheduled. Note that he is a candidate for tranexamic acid and will receive that as well as dexamethasone preop. He plans on going home after the surgery. He is given his home medications of  aspirin, iron, Robaxin MiraLAX and Colace. Note that his medical doctor is Dr. Unk Pinto and his chronic pain physician is Dr. Nicholaus Bloom for chronic back pain.    PCP: No primary provider on file.   Discharged Condition: good  Hospital Course:  Patient underwent the above stated procedure on 11/20/2011. Patient tolerated the procedure well and brought to the recovery room in good condition and subsequently to the floor.  POD #1 BP: 105/59 ; Pulse: 64 ; Temp: 98.2 F (36.8 C) ; Resp: 16  Pt's foley was removed, as well as the hemovac drain removed. IV was changed to a saline lock. Patient reports pain as mild, pain well controlled. No events throughout the night.  Neurovascular intact, dorsiflexion/plantar flexion intact, incision: dressing C/D/I, no  cellulitis present and compartment soft.   LABS  Basename  11/21/11 0415   HGB  8.1  HCT  24.9   POD #2  BP: 113/61 ; Pulse: 75 ; Temp: 97.7 F (36.5 C) ; Resp: 16 Patient reports pain as mild, well controlled with medication. No events throughout the night. Asymptomatic with expected blood loss anemia. Ready for discharge home.  Neurovascular intact, dorsiflexion/plantar flexion intact, incision: dressing C/D/I, no cellulitis present and compartment soft.   LABS  Basename  11/22/11 0415   HGB  7.7  HCT  23.8    Discharge Exam: General appearance: alert, cooperative and no distress Extremities: Homans sign is negative, no sign of DVT, no edema, redness or tenderness in the calves or thighs and no ulcers, gangrene or trophic changes  Disposition:  Home with follow up in 2 weeks  Follow-up Information    Follow up with OLIN,Thera Basden D in 2 weeks.   Contact information:   [facility] [address] [place] [phone]          Discharge Orders    Future Orders Please Complete By Expires   Diet - low sodium heart healthy      Call MD / Call 911      Comments:   If you experience chest pain or shortness of breath, CALL 911 and be transported to the hospital emergency room.  If you develope a fever above 101 F, pus (white drainage) or increased drainage or redness at the wound, or calf pain, call your surgeon's office.   Discharge instructions      Comments:  Maintain surgical dressing for 8 days, then replace with gauze and tape. Keep the area dry and clean until follow up. Follow up in 2 weeks at [facility]. Call with any questions or concerns.     Constipation Prevention      Comments:   Drink plenty of fluids.  Prune juice may be helpful.  You may use a stool softener, such as Colace (over the counter) 100 mg twice a day.  Use MiraLax (over the counter) for constipation as needed.   Increase activity  slowly as tolerated      Driving restrictions      Comments:   No driving for 4 weeks   TED hose      Comments:   Use stockings (TED hose) for 2 weeks on both leg(s).  You may remove them at night for sleeping.   Change dressing      Comments:   Maintain surgical dressing for 8 days, then change the dressing daily with sterile 4 x 4 inch gauze dressing and tape. Keep the area dry and clean.      Current Discharge Medication List    START taking these medications   Details  aspirin EC 325 MG tablet Take 1 tablet (325 mg total) by mouth 2 (two) times daily. X 4 weeks Qty: 60 tablet, Refills: 0    diphenhydrAMINE (BENADRYL) 25 mg capsule Take 1 capsule (25 mg total) by mouth every 6 (six) hours as needed for itching, allergies or sleep.    docusate sodium 100 MG CAPS Take 100 mg by mouth 2 (two) times daily.    ferrous sulfate 325 (65 FE) MG tablet Take 1 tablet (325 mg total) by mouth 3 (three) times daily after meals.    methocarbamol (ROBAXIN) 500 MG tablet Take 1 tablet (500 mg total) by mouth every 6 (six) hours as needed (muscle spasms).    oxyCODONE (OXYCONTIN) 30 MG TB12 Take 1 tablet (30 mg total) by mouth every 12 (twelve) hours. Qty: 60 tablet, Refills: 0    polyethylene glycol (MIRALAX / GLYCOLAX) packet Take 17 g by mouth 2 (two) times daily.      CONTINUE these medications which have CHANGED   Details  oxyCODONE 10 MG TABS Take 1-2 tablets (10-20 mg total) by mouth every 4 (four) hours as needed for pain. Qty: 100 tablet, Refills: 0      CONTINUE these medications which have NOT CHANGED   Details  Cholecalciferol 5000 UNITS capsule Take 10,000 Units by mouth daily.    furosemide (LASIX) 40 MG tablet Take 40 mg by mouth daily.    Kelp TABS Take 1 tablet by mouth daily.    levothyroxine (SYNTHROID, LEVOTHROID) 75 MCG tablet Take 75 mcg by mouth daily before breakfast.    MILK THISTLE PO Take 1 capsule by mouth daily. 233m each.    Multiple Vitamin  (MULITIVITAMIN WITH MINERALS) TABS Take 1 tablet by mouth daily.    NONFORMULARY OR COMPOUNDED ITEM Apply 2 each topically daily. Testosterone cream 10%. Applies 272mdaily.    omeprazole (PRILOSEC) 20 MG capsule Take 20 mg by mouth daily.    !! OVER THE COUNTER MEDICATION Take 1 tablet by mouth daily. Mega Magnesium -  Multiple magnesium ingredients.-(magnesium from amino acids chelate,citrate and malate 40428g, malic acid 22768g    !! OVER THE COUNTER MEDICATION Take 1 tablet by mouth daily. "Skeletal Strength" herbal supplement    Red Yeast Rice 600 MG CAPS Take 1 capsule by mouth  daily.    sennosides-docusate sodium (SENOKOT-S) 8.6-50 MG tablet Take 1 tablet by mouth at bedtime. Takes 4-8 per day depending on symptoms of constipation     !! - Potential duplicate medications found. Please discuss with provider.       Signed:  [place] Pugh. Tuesday Terlecki   PAC  11/22/2011, 8:26 AM

## 2011-11-21 NOTE — Evaluation (Signed)
Physical Therapy Evaluation Patient Details Name: [name] Fernandez MRN: [id number] DOB: 08/10/1940 Today's Date: 11/21/2011 Time: 2103-1281 PT Time Calculation (min): 32 min  PT Assessment / Plan / Recommendation Clinical Impression  Pt with R TKR revision presents with decreased R LE strength/ROM and limitations in functional mobility    PT Assessment  Patient needs continued PT services    Follow Up Recommendations  Home health PT    Barriers to Discharge        lEquipment Recommendations  None recommended by PT    Recommendations for Other Services OT consult   Frequency 7X/week    Precautions / Restrictions Precautions Precautions: Knee Required Braces or Orthoses: Knee Immobilizer - Right Knee Immobilizer - Right: Discontinue once straight leg raise with < 10 degree lag Restrictions Weight Bearing Restrictions: No Other Position/Activity Restrictions: WBAT   Pertinent Vitals/Pain 5/10; premedicated, cold packs provided     Mobility  Bed Mobility Bed Mobility: Supine to Sit Supine to Sit: 4: Min assist;With rails Details for Bed Mobility Assistance: cues for use of L LE to self assist Transfers Transfers: Sit to Stand;Stand to Sit Sit to Stand: 4: Min assist Stand to Sit: 4: Min assist Details for Transfer Assistance: cues for LE management and use of UEs to self assist Ambulation/Gait Ambulation/Gait Assistance: 4: Min Wellsite geologist (Feet): 23 Feet Assistive device: Rolling walker Ambulation/Gait Assistance Details: cues for sequence, posture and position from RW Gait Pattern: Step-to pattern    Exercises Total Joint Exercises Ankle Circles/Pumps: AROM;10 reps;Supine;Both Quad Sets: AROM;10 reps;Supine;Both Heel Slides: AAROM;10 reps;Right;Supine Straight Leg Raises: AAROM;Right;10 reps;Supine   PT Diagnosis: Difficulty walking  PT Problem List: Decreased strength;Decreased range of motion;Decreased activity tolerance;Decreased  mobility;Decreased knowledge of use of DME;Pain PT Treatment Interventions: DME instruction;Gait training;Stair training;Functional mobility training;Therapeutic activities;Therapeutic exercise;Patient/family education   PT Goals Acute Rehab PT Goals PT Goal Formulation: With patient Time For Goal Achievement: 11/27/11 Potential to Achieve Goals: Good Pt will go Supine/Side to Sit: with supervision PT Goal: Supine/Side to Sit - Progress: Goal set today Pt will go Sit to Supine/Side: with supervision PT Goal: Sit to Supine/Side - Progress: Goal set today Pt will go Sit to Stand: with supervision PT Goal: Sit to Stand - Progress: Goal set today Pt will go Stand to Sit: with supervision PT Goal: Stand to Sit - Progress: Goal set today Pt will Ambulate: 51 - 150 feet;with supervision;with rolling walker PT Goal: Ambulate - Progress: Goal set today Pt will Go Up / Down Stairs: 1-2 stairs;with min assist;with rolling walker PT Goal: Up/Down Stairs - Progress: Goal set today  Visit Information  Last PT Received On: 11/21/11 Assistance Needed: +1    Subjective Data  Subjective: I had a block in my leg so I can't lift it Patient Stated Goal: Resume previous lifestyle with decreased pain   Prior Functioning  Home Living Lives With: Spouse Available Help at Discharge: Family Type of Home: House Home Access: Ramped entrance;Stairs to enter Technical brewer of Steps: 1 Entrance Stairs-Rails: None Home Layout: One level Home Adaptive Equipment: Walker - rolling Prior Function Level of Independence: Independent Able to Take Stairs?: Yes Driving: Yes Communication Communication: No difficulties    Cognition  Overall Cognitive Status: Appears within functional limits for tasks assessed/performed Arousal/Alertness: Awake/alert Orientation Level: Appears intact for tasks assessed Behavior During Session: [facility] for tasks performed    Extremity/Trunk Assessment Right Upper  Extremity Assessment RUE ROM/Strength/Tone: [facility] for tasks assessed Left Upper Extremity Assessment LUE ROM/Strength/Tone: [facility]  for tasks assessed Right Lower Extremity Assessment RLE ROM/Strength/Tone: Deficits RLE ROM/Strength/Tone Deficits: -10 - 40 AAROM at knee; 2/5 quad strength Left Lower Extremity Assessment LLE ROM/Strength/Tone: WFL for tasks assessed   Balance    End of Session PT - End of Session Equipment Utilized During Treatment: Right knee immobilizer Activity Tolerance: Patient tolerated treatment well Patient left: in chair;with call bell/phone within reach Nurse Communication: Mobility status   [name] 11/21/2011, 12:27 PM

## 2011-11-21 NOTE — Op Note (Signed)
NAMEJAISON, PETRAGLIA NO.:  [id number]  MEDICAL RECORD NO.:  [id number]  LOCATION:  4431                         FACILITY:  [facility]  PHYSICIAN:  Pietro Cassis. Alvan Dame, M.D.  DATE OF BIRTH:  03-08-1941  DATE OF PROCEDURE:  11/20/2011 DATE OF DISCHARGE:                              OPERATIVE REPORT   PREOPERATIVE DIAGNOSIS:  Persistently painful right total knee replacement in 2011.  Concern for loosening based on workup with no signs of infection.  POSTOPERATIVE DIAGNOSIS:  Persistently painful right total knee replacement in 2011.  Concern for loosening based on workup with no signs of infection.  PROCEDURE:  Revision right total knee replacement utilizing DePuy rotating platform, revision component size 5 TC3 femoral component with a 13 x 30 mm cemented stem, a 5 degree bolt with a +2 adapter and 4 mm posterior medial and lateral augments.  The tibia side was a size 5 MBT revision tray utilizing a size 29 mm cemented sleeve, a size 5 mm medial step wedge and a 13 x 30 mm cemented stem.  SURGEON:  Pietro Cassis. Alvan Dame, M.D.  ASSISTANT:  Danae Orleans, PAC.  Please note that Mr. Guinevere Scarlet was present for the entirety of the case, critical for management of the extremity retractors and general facilitation of the case and primary wound closure.  ANESTHESIA:  Preoperative regional femoral nerve block plus a general anesthetic.  DRAINS:  One medium Hemovac.  SPECIMENS:  None, as the joint fluid was clear.  TOURNIQUET TIME:  82 minutes at 250 mmHg.  BLOOD LOSS:  Minimal, probably less  than 100 mL after tourniquet was let down.  COMPLICATION:  None apparent.  INDICATION FOR PROCEDURE:  Mr. Shimabukuro is a 71 year old gentleman with a history of right total knee replacement approximately 2 years ago.  He was seen and evaluated for a second opinion for persistent pain and swelling in his knee.  Workup including aspiration was negative for infection.  Bone scan was  positive for a concern for loosening.  Given the persistence of his pain and that findings and workup were negative for infection he wished at this point to proceed with surgery with an underlying diagnosis of aseptic failure of knee replacement.  Cannot explain to him the source of his pain at this point, however given his persistent pain, he wished to proceed.  Risks of recurrent loosening, infection, DVT, component failure and need for future visits were all discussed and reviewed.  Consent was obtained for benefit of pain relief.  PROCEDURE:  The patient was brought to the operative theater.  Adequate anesthesia, preoperative antibiotics with Ancef were administered.  The patient was positioned supine with the right thigh tourniquet placed.  The right lower extremity was then prepped and draped in a sterile fashion.  The right foot was placed in a DeMayo leg holder. Time-out was performed identifying the patient, planned procedure, and extremity.  The leg was exsanguinated, tourniquet elevated to 250 mmHg.  His prior incision was utilized and a portion of it excised and extended.  Soft tissue plane was created, a median arthrotomy then made encountering clear synovial fluid.  Following initial exposure of the medial, lateral gutters  and suprapatellar pouch area, retractors were placed and attention was first directed to the tibia.  The proximal tibia was exposed with a proximal medial peel.  I then used an osteotome and removed the old polyethylene insert.  A small oscillating ACL type saw blade was utilized to undermine the cement component interface.  I then used an osteotome and was able to elevate the tibial component without much difficulty.  I kept it in place as we now attended to the femur. The medial and lateral aspects of the femur were debrided of synovium and soft tissue.  The same ACL saw blade was used to undermine this cement bone interface again.  Once this was  done medially and laterally with simultaneous osteotome impaction of the distal aspect of the component, removed the femoral component without difficulty and with minimal bone loss at all.  At this point, I removed the remaining cement off the distal femur, which did not require much.  At this point, the tibia was subluxated anteriorly. With adequate exposure, I was able to elevate the tibial component and remove it without difficulty. At this point, we removed all the cement off the proximal tibia including down into the canal.  I reamed both the femur and the tibia to 15 mm.  The tibial component previously placed had a little bit of varus.  I thus decided to remove a little bit of bone off the medial proximal tibia, and used a 5 mm augment.  I confirmed the orientation of this using an alignment rod.  I broached for a 29 mm cemented sleeve.  A trial MBT revision tray was then placed, a 5 mm step wedge and the 29 sleeve.  This was oriented perpendicular to the tibia.  At this point, I sized the femur to be a size 5, an intramedullary rod was passed the distal cut, recognized that the previous component was not of good extension,  there was minimal bone removed, but at least reoriented the position for the femoral component.  The size 5 rotation block was then pinned into position with a +2 bolt, set based on the orientation of the proximal tibial cut and tray.  The anterior-posterior and chamfer cuts were then made  with minimal bone required to be removed.  I decided at this point I would use +4 augments medially and laterally posteriorly in order to keep the femur more along the anterior shaft of the femur as opposed to the anterior lines.  A final box cut was made for TC3 component to help with cement fixation.  At this point, trial reduction was now carried out with a 5 TC3 femur with 13 x 30 stem with a +2 adapter and 5 degree bolt.  The tibial tray was already inserted, the  keel was placed and trial reduction with a 12.5 insert.  Finally, the knee came to full extension, was stable from extension to flexion with good tension on the ligaments medially, which were stable.  Given all these findings, the trial component was removed.  We placed cement restrictors, size 5 in both the proximal tibia and distal femur. I irrigated the wound in and out with approximately 2 L normal saline solution and cement was mixed.  We mixed 3 batches of gentamicin impregnated cement and final components were cemented into place.  The final components were then opened and confirmed and configured on the back table as were the trials.  Again, a size 5 MBT revision tray with a 5  mm  medial step augment and a 13 x 30 stem with a 29 cemented sleeve.  The femoral component was a size 5 TC3 femoral component with a +2 adapter and 5 degree bolt, 13 x 30 cemented stem, and 4 mm posterior medial and lateral augments.  The final components were cemented into place.  The knee was brought to extension again with a 12.5 insert.  Extruded cement was removed.  Then the side of the patella was stable after evaluating it grossly.  Once the cement had fully cured, excessive cement was removed throughout the knee.  The final 12.5 insert, posterior stabilized, was chosen based on the intact medial and lateral ligaments.  The final component was placed into the empty MBT tray and the knee was re-irrigated with normal saline solution.  The tourniquet had been let down at 82 minutes.  A medium Hemovac drain was placed deep.  The extensor mechanism was then reapproximated with the knee in flexion using 1 Vicryl.  The remainder of the wound was closed with 2-0 Vicryl, and staples on the skin.  The skin was cleaned and dressed sterilely using a bulky sterile wrap.  He was then extubated and brought to the recovery room in stable condition tolerating the procedure well.     Pietro Cassis Alvan Dame,  M.D.     MDO/MEDQ  D:  11/20/2011  T:  11/21/2011  Job:  [id number]

## 2011-11-21 NOTE — Progress Notes (Signed)
Physical Therapy Treatment Patient Details Name: [name] Fernandez MRN: [id number] DOB: 1940/12/10 Today's Date: 11/21/2011 Time: 5885-0277 PT Time Calculation (min): 23 min  PT Assessment / Plan / Recommendation Comments on Treatment Session  Pt motivated but limited by pain.  RN aware and providing Muscle Relaxer.  Ice packs provided.    Follow Up Recommendations  Home health PT    Barriers to Discharge        Equipment Recommendations  None recommended by PT    Recommendations for Other Services OT consult  Frequency 7X/week   Plan Discharge plan remains appropriate    Precautions / Restrictions Precautions Precautions: Knee Required Braces or Orthoses: Knee Immobilizer - Right Knee Immobilizer - Right: Discontinue once straight leg raise with < 10 degree lag Restrictions Weight Bearing Restrictions: No Other Position/Activity Restrictions: WBAT   Pertinent Vitals/Pain 8/10    Mobility  Bed Mobility Bed Mobility: Supine to Sit;Sit to Supine Supine to Sit: 4: Min assist Sit to Supine: 4: Min assist Details for Bed Mobility Assistance: cues for use of L LE to self assist Transfers Transfers: Sit to Stand;Stand to Sit Sit to Stand: 4: Min assist Stand to Sit: 4: Min assist Details for Transfer Assistance: cues for LE management and use of UEs to self assist Ambulation/Gait Ambulation/Gait Assistance: 4: Min assist Ambulation Distance (Feet): 48 Feet Assistive device: Rolling walker Ambulation/Gait Assistance Details: cues for stride length, posture and position from RW : multiple standing rests to complete task 2* discomfort Gait Pattern: Step-to pattern    Exercises     PT Diagnosis:    PT Problem List:   PT Treatment Interventions:     PT Goals Acute Rehab PT Goals PT Goal Formulation: With patient Time For Goal Achievement: 11/27/11 Potential to Achieve Goals: Good Pt will go Supine/Side to Sit: with supervision PT Goal: Supine/Side to Sit - Progress:  Progressing toward goal Pt will go Sit to Supine/Side: with supervision PT Goal: Sit to Supine/Side - Progress: Progressing toward goal Pt will go Sit to Stand: with supervision PT Goal: Sit to Stand - Progress: Progressing toward goal Pt will go Stand to Sit: with supervision PT Goal: Stand to Sit - Progress: Progressing toward goal Pt will Ambulate: 51 - 150 feet;with supervision;with rolling walker PT Goal: Ambulate - Progress: Progressing toward goal Pt will Go Up / Down Stairs: 1-2 stairs;with min assist;with rolling walker PT Goal: Up/Down Stairs - Progress: Goal set today  Visit Information  Last PT Received On: 11/21/11 Assistance Needed: +1    Subjective Data  Subjective: I had my pain medicine but it sure hurts a lot to walk on it Patient Stated Goal: Resume previous lifestyle with decreased pain   Cognition  Overall Cognitive Status: Appears within functional limits for tasks assessed/performed Arousal/Alertness: Awake/alert Orientation Level: Appears intact for tasks assessed Behavior During Session: [facility] for tasks performed    Balance     End of Session PT - End of Session Equipment Utilized During Treatment: Right knee immobilizer Activity Tolerance: Patient limited by pain Patient left: in bed;with call bell/phone within reach;with family/visitor present Nurse Communication: Mobility status    [name] Fernandez 11/21/2011, 3:48 PM

## 2011-11-21 NOTE — Progress Notes (Signed)
  Subjective: 1 Day Post-Op Procedure(s) (LRB): TOTAL KNEE REVISION (Right)   Patient reports pain as mild, pain well controlled. No events throughout the night.   Objective:   VITALS:   Filed Vitals:   11/21/11 0556  BP: 105/59  Pulse: 64  Temp: 98.2 F (36.8 C)  Resp: 16    Neurovascular intact Dorsiflexion/Plantar flexion intact Incision: dressing C/D/I No cellulitis present Compartment soft  LABS  Basename 11/21/11 0415  HGB 8.1*  HCT 24.9*  WBC 8.8  PLT 357     Basename 11/21/11 0415  NA 135  K 3.3*  BUN 16  CREATININE 1.19  GLUCOSE 109*     Assessment/Plan: 1 Day Post-Op Procedure(s) (LRB): TOTAL KNEE REVISION (Right)   HV drain d/c'ed Foley cath d/c'ed Advance diet Up with therapy D/C IV fluids Discharge home with home health when ready, possibly [place]. Punam Broussard   PAC  11/21/2011, 9:50 AM

## 2011-11-22 ENCOUNTER — Encounter (HOSPITAL_COMMUNITY): Payer: Self-pay | Admitting: Orthopedic Surgery

## 2011-11-22 LAB — CBC
MCH: 26.3 pg (ref 26.0–34.0)
MCV: 81.2 fL (ref 78.0–100.0)
Platelets: 331 10*3/uL (ref 150–400)
RBC: 2.93 MIL/uL — ABNORMAL LOW (ref 4.22–5.81)
RDW: 19 % — ABNORMAL HIGH (ref 11.5–15.5)

## 2011-11-22 LAB — BASIC METABOLIC PANEL
CO2: 28 mEq/L (ref 19–32)
Calcium: 8.4 mg/dL (ref 8.4–10.5)
Creatinine, Ser: 0.97 mg/dL (ref 0.50–1.35)
GFR calc Af Amer: >90 mL/min (ref >90)
GFR calc non Af Amer: 82 mL/min — ABNORMAL LOW (ref >90)
Sodium: 133 mEq/L — ABNORMAL LOW (ref 135–145)

## 2011-11-22 MED ORDER — OXYCODONE HCL 10 MG PO TABS: 10.0000 mg | ORAL_TABLET | ORAL | Status: AC | PRN

## 2011-11-22 MED ORDER — OXYCODONE HCL 30 MG PO TB12: 30.0000 mg | ORAL_TABLET | Freq: Two times a day (BID) | ORAL | Status: DC

## 2011-11-22 NOTE — Progress Notes (Signed)
Subjective: 2 Days Post-Op Procedure(s) (LRB): TOTAL KNEE REVISION (Right)   Patient reports pain as mild, well controlled with medication. No events throughout the night. Asymptomatic with lower H&H. Ready for discharge home.   Objective:   VITALS:   Filed Vitals:   11/22/11 0559  BP: 113/61  Pulse: 75  Temp: 97.7 F (36.5 C)  Resp: 16    Neurovascular intact Dorsiflexion/Plantar flexion intact Incision: dressing C/D/I No cellulitis present Compartment soft  LABS  Basename 11/22/11 0415 11/21/11 0415  HGB 7.7* 8.1*  HCT 23.8* 24.9*  WBC 9.8 8.8  PLT 331 357     Basename 11/22/11 0415 11/21/11 0415  NA 133* 135  K 3.5 3.3*  BUN 13 16  CREATININE 0.97 1.19  GLUCOSE 150* 109*     Assessment/Plan: 2 Days Post-Op Procedure(s) (LRB): TOTAL KNEE REVISION (Right)   Up with therapy Discharge home with home health Follow up in 2 weeks at [facility].  Follow-up Information    Follow up with OLIN,Yutaka Holberg D in 2 weeks.   Contact information:   [facility] [address], [address] Zelie Asbill   PAC  11/22/2011, 8:06 AM

## 2011-11-22 NOTE — Care Management Note (Signed)
    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 Plans are for patient to return to his home in [place] where  spouse will be caregiver .Pt already has rw and states CPM was delivered to his home prior to his admission. Will need HHPT and 3n1   Anticipated DC Date:  11/21/2011   Anticipated DC Plan:  [place]  In-house referral  NA      DC Planning Services  CM consult      [facility] Choice  HOME HEALTH  DURABLE MEDICAL EQUIPMENT   Choice offered to / List presented to:  C-1 Patient   DME arranged  3-N-1      DME agency  OTHER - SEE NOTE     HH arranged  HH-2 PT      [place] agency  [facility]   Status of service:  Completed, signed off Medicare Important Message given?  NO (If response is "NO", the following Medicare IM given date fields will be blank) Date Medicare IM given:   Date Additional Medicare IM given:    Discharge Disposition:  [name]  Per UR Regulation:  Reviewed for med. necessity/level of care/duration of stay  If discussed at [place] of Stay Meetings, dates discussed:    Comments:  11/22/2011 [place] [phone] Plans are for discharge today with [facility] services in place. [facility] notified. Patient state CPM has already been deliverd to his home. Was ordered prior to admission. Pt is awaiting delivery of 3n1 at home. HHpt will start tomorrow. tct latoya at [facility] regarding when delivery of 3n1 anticipated to patient's home.   11/21/2011 [place] BSN CCM [phone] Tct to Worker's Comp vendor  for [facility] and DME-talked to [employer] who is requesting [facility] orders for HHpt, DME-3N1, face sheet, op note, H&P, PT notes. INfo faxed to [phone] with confirmation. Was advised by Lendell Caprice that [facility] services have already been set up with [facility] and will start day after patient return to home

## 2011-11-22 NOTE — Evaluation (Signed)
Occupational Therapy Evaluation Patient Details Name: [name] Fernandez MRN: [id number] DOB: [birth year] Today's Date: 11/22/2011 Time: 8338-2505 OT Time Calculation (min): 28 min  OT Assessment / Plan / Recommendation Clinical Impression  Pt currently limited by increased pain (8/10) but will have assist at discharge. Initially he didnt feel he needed to practice ADL but then states wife is anxious about pt coming home and explained benefit of practicing tasks her to increase independence and confidence. Pt agreeable to practice with OT. Feel pt will progress with functional transfers with PT. All education completed. willl sign off for OT.    OT Assessment  Patient does not need any further OT services    Follow Up Recommendations  No OT follow up    Barriers to Discharge      Equipment Recommendations  3 in 1 bedside comode;None recommended by PT    Recommendations for Other Services    Frequency       Precautions / Restrictions Precautions Precautions: Knee Required Braces or Orthoses: Knee Immobilizer - Right Knee Immobilizer - Right: Discontinue once straight leg raise with < 10 degree lag;Other (comment) (performed SLR independently) Restrictions Weight Bearing Restrictions: No        ADL  Eating/Feeding: Simulated;Independent Where Assessed - Eating/Feeding: Bed level Grooming: Simulated;Wash/dry hands;Set up Where Assessed - Grooming: Unsupported sitting Upper Body Bathing: Simulated;Chest;Right arm;Left arm;Abdomen;Set up Where Assessed - Upper Body Bathing: Unsupported sitting Lower Body Bathing: Simulated;Minimal assistance Where Assessed - Lower Body Bathing: Supported sit to stand Upper Body Dressing: Simulated;Set up Where Assessed - Upper Body Dressing: Unsupported sitting Lower Body Dressing: Simulated;Moderate assistance Where Assessed - Lower Body Dressing: Sopported sit to stand Toilet Transfer: Performed;Min guard Toilet Transfer Method: Other (comment)  (ambulating and min verbal cues) Toilet Transfer Equipment: Raised toilet seat with arms (or 3-in-1 over toilet) Toileting - Clothing Manipulation and Hygiene: Simulated;Min guard Where Assessed - Camera operator Manipulation and Hygiene: Standing Tub/Shower Transfer: Simulated;Min guard Tub/Shower Transfer Method: Other (comment) (step back over ledge simulated) Equipment Used: Rolling walker ADL Comments: Pt tried to let go of RW before fully backing up to 3in1. Explained need to use RW untill all the way up to the surface he will be sitting on and then let go of RW. Also explained to use 3in1 armrests not grab bar as he will be using 3in1 at home and doesnt have grab bar at home. Pt states it was difficult to put socks on even after last surgery up until this surgery. Showed adn demonstrated sock aid and coverage as an option if needed.     OT Diagnosis:    OT Problem List:   OT Treatment Interventions:     OT Goals    Visit Information  Last OT Received On: 11/22/11 Assistance Needed: +1    Subjective Data  Subjective: I can practice. (referring to ADL) Patient Stated Goal: decrease pain   Prior Functioning  Home Living Lives With: Spouse Available Help at Discharge: Family Type of Home: House Home Access: Ramped entrance;Stairs to enter Technical brewer of Steps: 1 Entrance Stairs-Rails: None Home Layout: One level Bathroom Shower/Tub: Multimedia programmer: Standard Home Adaptive Equipment: Environmental consultant - rolling Prior Function Level of Independence: Independent Able to Take Stairs?: Yes Driving: Yes Communication Communication: No difficulties    Cognition  Overall Cognitive Status: Appears within functional limits for tasks assessed/performed Arousal/Alertness: Awake/alert Orientation Level: Appears intact for tasks assessed Behavior During Session: [facility] for tasks performed    Extremity/Trunk Assessment Right  Upper Extremity Assessment RUE  ROM/Strength/Tone: Within functional levels Left Upper Extremity Assessment LUE ROM/Strength/Tone: Within functional levels   Mobility Bed Mobility Bed Mobility: Supine to Sit Supine to Sit: 5: Supervision;HOB elevated Transfers Transfers: Stand to Sit;Sit to Stand Sit to Stand: 4: Min guard;With upper extremity assist;From bed;From chair/3-in-1 Stand to Sit: 4: Min guard;With upper extremity assist;To chair/3-in-1 Details for Transfer Assistance: min verbal cues for hand placement   Exercise    Balance    End of Session OT - End of Session Activity Tolerance: Patient tolerated treatment well Patient left: in chair;with call bell/phone within reach   Jules Schick [phone] 11/22/2011, 12:42 PM

## 2011-11-22 NOTE — Anesthesia Postprocedure Evaluation (Signed)
  Anesthesia Post-op Note  Patient: [name] Fernandez  Procedure(s) Performed: Procedure(s) (LRB): TOTAL KNEE REVISION (Right)  Patient Location: PACU  Anesthesia Type: GA combined with regional for post-op pain  Level of Consciousness: awake and alert   Airway and Oxygen Therapy: Patient Spontanous Breathing  Post-op Pain: mild  Post-op Assessment: Post-op Vital signs reviewed, Patient's Cardiovascular Status Stable, Respiratory Function Stable, Patent Airway and No signs of Nausea or vomiting  Post-op Vital Signs: stable  Complications: No apparent anesthesia complications

## 2012-04-24 ENCOUNTER — Encounter: Payer: Self-pay | Admitting: Internal Medicine

## 2012-04-29 ENCOUNTER — Encounter: Payer: Self-pay | Admitting: Internal Medicine

## 2012-04-30 ENCOUNTER — Ambulatory Visit (INDEPENDENT_AMBULATORY_CARE_PROVIDER_SITE_OTHER): Payer: Medicare Other | Admitting: Internal Medicine

## 2012-04-30 ENCOUNTER — Encounter: Payer: Self-pay | Admitting: Internal Medicine

## 2012-04-30 ENCOUNTER — Ambulatory Visit: Payer: Self-pay | Admitting: Internal Medicine

## 2012-04-30 VITALS — BP 124/58 | HR 71 | Ht 66.5 in | Wt 174.0 lb

## 2012-04-30 DIAGNOSIS — K219 Gastro-esophageal reflux disease without esophagitis: Secondary | ICD-10-CM | POA: Insufficient documentation

## 2012-04-30 DIAGNOSIS — E039 Hypothyroidism, unspecified: Secondary | ICD-10-CM | POA: Insufficient documentation

## 2012-04-30 DIAGNOSIS — Z9889 Other specified postprocedural states: Secondary | ICD-10-CM

## 2012-04-30 DIAGNOSIS — Z09 Encounter for follow-up examination after completed treatment for conditions other than malignant neoplasm: Secondary | ICD-10-CM

## 2012-04-30 DIAGNOSIS — Z1211 Encounter for screening for malignant neoplasm of colon: Secondary | ICD-10-CM

## 2012-04-30 DIAGNOSIS — D509 Iron deficiency anemia, unspecified: Secondary | ICD-10-CM

## 2012-04-30 MED ORDER — PEG-KCL-NACL-NASULF-NA ASC-C 100 G PO SOLR: 1.0000 | Freq: Once | ORAL | Status: DC

## 2012-04-30 MED ORDER — FAMOTIDINE 20 MG PO TABS: 20.0000 mg | ORAL_TABLET | Freq: Two times a day (BID) | ORAL | Status: DC

## 2012-04-30 MED ORDER — OMEPRAZOLE 20 MG PO CPDR: 20.0000 mg | DELAYED_RELEASE_CAPSULE | Freq: Every day | ORAL | Status: DC

## 2012-04-30 NOTE — Progress Notes (Signed)
SUBJECTIVE: HPI [name] Fernandez is a 71 year old male with a past medical history of GERD status post Nissen fundoplication and repair of Nissen fundoplication [id number], hypothyroidism who seen in consultation at the request of Dr. Melford Aase for evaluation of anemia. The patient is alone today. Unfortunately, we do not have the recent laboratory data in clinic notes from his PCP, but there is report of iron deficiency anemia. The patient reports he was told his blood counts were low and needed investigation. He does have heartburn and burning epigastric discomfort when not taking Prilosec. He is currently taking Prilosec 20 mg and for the most part his heartburn is controlled. Here estimates 1-2 days a week he has breakthrough heartburn at night. He uses TUMS for this.  He denies dysphagia and odynophagia. No rectal bleeding, hematochezia or melena. He denies abdominal pain, nausea and vomiting. No fever or chills. Bowel movements are normal, but he is using Senokot 4-8 tablets per day.  He denies diarrhea.  He has never had a colonoscopy.  Review of Systems  As per history of present illness, otherwise negative   Past Medical History  Diagnosis Date  . Hypothyroidism   . Shortness of breath     from oxycodone at times  . H/O hiatal hernia   . Hiatal hernia   . Esophageal stricture     Current Outpatient Prescriptions  Medication Sig Dispense Refill  . Cholecalciferol 5000 UNITS capsule Take 10,000 Units by mouth daily.      . furosemide (LASIX) 40 MG tablet Take 40 mg by mouth daily.      Marland Kitchen levothyroxine (SYNTHROID, LEVOTHROID) 75 MCG tablet Take 75 mcg by mouth daily before breakfast.      . MILK THISTLE PO Take 1 capsule by mouth daily. 253m each.      . Multiple Vitamin (MULITIVITAMIN WITH MINERALS) TABS Take 1 tablet by mouth daily.      . NONFORMULARY OR COMPOUNDED ITEM Apply 2 each topically daily. Testosterone cream 10%. Applies 235mdaily.      . Marland KitchenVER THE COUNTER MEDICATION Take 1  tablet by mouth daily. Mega Magnesium -  Multiple magnesium ingredients.-(magnesium from amino acids chelate,citrate and malate 40485g, malic acid 22462g      . OVER THE COUNTER MEDICATION Take 1 tablet by mouth daily. "Skeletal Strength" herbal supplement      . OxyCODONE HCl ER 30 MG T12A Take 1 capsule by mouth every 4 (four) hours.      . Red Yeast Rice 600 MG CAPS Take 2 capsules by mouth daily.       . sennosides-docusate sodium (SENOKOT-S) 8.6-50 MG tablet Take 1 tablet by mouth at bedtime. Takes 4-8 per day depending on symptoms of constipation      . [DISCONTINUED] omeprazole (PRILOSEC) 20 MG capsule Take 20 mg by mouth daily.      . diphenhydrAMINE (BENADRYL) 25 mg capsule Take 1 capsule (25 mg total) by mouth every 6 (six) hours as needed for itching, allergies or sleep.      . famotidine (PEPCID) 20 MG tablet Take 1 tablet (20 mg total) by mouth 2 (two) times daily.  30 tablet  1  . omeprazole (PRILOSEC) 20 MG capsule Take 1 capsule (20 mg total) by mouth daily.  90 capsule  3  . peg 3350 powder (MOVIPREP) 100 G SOLR Take 1 kit (100 g total) by mouth once.  1 kit  0    No Known Allergies  Family History  Problem Relation  Age of Onset  . Esophageal cancer Father     History  Substance Use Topics  . Smoking status: Former Smoker -- 1.0 packs/day for 20 years    Types: Cigarettes  . Smokeless tobacco: Never Used  . Alcohol Use: No    OBJECTIVE: BP 124/58  Pulse 71  Ht 5' 6.5" (1.689 m)  Wt 174 lb (78.926 kg)  BMI 27.66 kg/m2 Constitutional: Well-developed and well-nourished. No distress. HEENT: Normocephalic and atraumatic. Oropharynx is clear and moist. No oropharyngeal exudate. Conjunctivae are normal. No scleral icterus. Neck: Neck supple. Trachea midline. Cardiovascular: Normal rate, regular rhythm and intact distal pulses. No M/R/G Pulmonary/chest: Effort normal and breath sounds normal. No wheezing, rales or rhonchi. Abdominal: Soft, nontender, nondistended.  Well-healed abdominal scars Bowel sounds active throughout.  No hepatosplenomegaly. Extremities: no clubbing, cyanosis, or edema Neurological: Alert and oriented to person place and time. Skin: Skin is warm and dry. No rashes noted. Psychiatric: Normal mood and affect. Behavior is normal.  Labs  Outside lab data waiting to be received  ASSESSMENT AND PLAN: 71 year old male with a past medical history of GERD status post Nissen fundoplication and repair of Nissen fundoplication [id number], hypothyroidism who seen in consultation at the request of Dr. Melford Aase for evaluation of anemia  1.  Anemia, possible iron deficiency -- given the patient's likely iron deficiency anemia I recommended EGD and colonoscopy. He has never had colonoscopy and this will suffice for colorectal cancer screening as well. He may require iron replacement, but I would like to review his iron studies and perform the endoscopies first. We discussed the test including the risks and benefits and he is agreeable to proceed.  2.  GERD -- the patient is having breakthrough symptoms several days per week, and I have recommended the addition of famotidine 20 mg each bedtime when necessary breakthrough heartburn. If this is not helpful we will likely increase his omeprazole to 40 mg daily. For now he is given a refill of omeprazole 20 mg daily #90 with 3 refills

## 2012-04-30 NOTE — Patient Instructions (Addendum)
You have been scheduled for a colonoscopy with propofol. Please follow written instructions given to you at your visit today.  Please pick up your prep kit at the pharmacy within the next 1-3 days. If you use inhalers (even only as needed) or a CPAP machine, please bring them with you on the day of your procedure.   We have sent the following medications to your pharmacy for you to pick up at your convenience: Omeprazole 49m take pepcid 20 mg for breakthrough heartburn only

## 2012-05-21 ENCOUNTER — Encounter: Payer: Self-pay | Admitting: Internal Medicine

## 2012-05-24 ENCOUNTER — Ambulatory Visit: Payer: Self-pay | Admitting: Internal Medicine

## 2012-05-24 ENCOUNTER — Telehealth: Payer: Self-pay | Admitting: Internal Medicine

## 2012-05-24 NOTE — Telephone Encounter (Signed)
Pt wanted to know if he could take his pain medication (oxycodone) the day of his procedure. Discussed with pt that he could take his medications that morning as per his instructions. Pt verbalized understanding.

## 2012-05-27 ENCOUNTER — Encounter: Payer: Self-pay | Admitting: Internal Medicine

## 2012-05-27 ENCOUNTER — Ambulatory Visit (AMBULATORY_SURGERY_CENTER): Payer: Medicare Other | Admitting: Internal Medicine

## 2012-05-27 VITALS — BP 121/71 | HR 73 | Resp 21

## 2012-05-27 DIAGNOSIS — D509 Iron deficiency anemia, unspecified: Secondary | ICD-10-CM

## 2012-05-27 DIAGNOSIS — Z538 Procedure and treatment not carried out for other reasons: Secondary | ICD-10-CM

## 2012-05-27 DIAGNOSIS — E611 Iron deficiency: Secondary | ICD-10-CM

## 2012-05-27 DIAGNOSIS — Z1211 Encounter for screening for malignant neoplasm of colon: Secondary | ICD-10-CM

## 2012-05-27 DIAGNOSIS — K6389 Other specified diseases of intestine: Secondary | ICD-10-CM

## 2012-05-27 DIAGNOSIS — K219 Gastro-esophageal reflux disease without esophagitis: Secondary | ICD-10-CM

## 2012-05-27 MED ORDER — SODIUM CHLORIDE 0.9 % IV SOLN: 500.0000 mL | INTRAVENOUS | Status: DC

## 2012-05-27 NOTE — Op Note (Signed)
[place]  [address]. [place], [address]   ENDOSCOPY PROCEDURE REPORT  PATIENT: [name] Fernandez, [name] Fernandez  MR#: [id number] BIRTHDATE: 04/11/1941 , 64  yrs. old GENDER: Male ENDOSCOPIST: Jerene Bears, MD REFERRED BY:  Unk Pinto PROCEDURE DATE:  05/27/2012 PROCEDURE:  EGD w/ biopsy ASA CLASS:     Class III INDICATIONS:  history of GERD.   Iron deficiency anemia. MEDICATIONS: MAC sedation, administered by CRNA, Propofol (Diprivan) 230 mg IV, and Robinul 0.2 mg IV TOPICAL ANESTHETIC: Cetacaine Spray  DESCRIPTION OF PROCEDURE: After the risks benefits and alternatives of the procedure were thoroughly explained, informed consent was obtained.  The LB GIF-H180 [id number] endoscope was introduced through the mouth and advanced to the second portion of the duodenum. Without limitations.  The instrument was slowly withdrawn as the mucosa was fully examined.    ESOPHAGUS: The esophagus was tortuous.   The esophagus was otherwise normal.  STOMACH: Evidence of prior fundoplication was found in the cardia characterized as healthy in appearance.   The mucosa of the stomach appeared normal.  DUODENUM: Mucosal changes suspicious for celiac disease were seen in the duodenal bulb and 2nd part of the duodenum characterized by decreased folds, scalloping and flattening.  Multiple random biopsies were performed using cold forceps. Retroflexed views revealed as above.     The scope was then withdrawn from the patient and the procedure completed.  COMPLICATIONS: There were no complications. ENDOSCOPIC IMPRESSION: 1.   The esophagus was tortuous in the distal esophagus 2.   The esophagus was otherwise normal. 3.   Evidence of prior fundoplication was found in the cardia 4.   The mucosa of the stomach appeared normal 5.   Mucosal changes suspicious for celiac disease were seen in the duodenal bulb and 2nd part of the duodenum; multiple random biopsies  RECOMMENDATIONS: 1.   Await pathology results 2.  Continue current medications  eSigned:  Jerene Bears, MD 05/27/2012 3:58 PM       IH:[id number] Melford Aase, MD and The Patient

## 2012-05-27 NOTE — Op Note (Signed)
[place]  [address] [place], [address]   COLONOSCOPY PROCEDURE REPORT  PATIENT: [name] Fernandez, [name] Fernandez  MR#: [id number] BIRTHDATE: 30-May-1941 , 71  yrs. old GENDER: Male ENDOSCOPIST: Jerene Bears, MD REFERRED [id number], Gwyndolyn Saxon PROCEDURE DATE:  05/27/2012 PROCEDURE:   Colonoscopy, incomplete ASA CLASS:   Class III INDICATIONS:average risk screening and Iron Deficiency Anemia. MEDICATIONS: MAC sedation, administered by CRNA and Propofol (Diprivan) 80 mg IV  DESCRIPTION OF PROCEDURE:   After the risks benefits and alternatives of the procedure were thoroughly explained, informed consent was obtained.  A digital rectal exam revealed no rectal mass.   The LB CF-H180AL [id number] and LB PCF-H180AL [id number] endoscope was introduced through the anus and advanced to the sigmoid colon. No adverse events experienced.   The quality of the prep was poor, using MoviPrep  The instrument was then slowly withdrawn as the colon was fully examined.     COLON FINDINGS: A significant amount of stool was present in the rectum and sigmoid colon preventing adequate screening.  Procedure terminated with scope only reaching the distal sigmoid. Retroflexion was not performed.        The scope was withdrawn and the procedure completed.  COMPLICATIONS: There were no complications.  ENDOSCOPIC IMPRESSION: Significant amount of stool was present in the rectum and sigmoid colon  RECOMMENDATIONS: Schedule repeat colonoscopy with 2 day bowel preparation   eSigned:  Jerene Bears, MD 05/27/2012 4:01 PM   cc: Unk Pinto, MD and The Patient

## 2012-05-27 NOTE — Patient Instructions (Signed)
Biopsies taken today. Continue current medications. Call us back to schedule repeat colonoscopy. Call us with any questions or concerns. Thank you!  YOU HAD AN ENDOSCOPIC PROCEDURE TODAY AT [facility]: Refer to the procedure report that was given to you for any specific questions about what was found during the examination.  If the procedure report does not answer your questions, please call your gastroenterologist to clarify.  If you requested that your care partner not be given the details of your procedure findings, then the procedure report has been included in a sealed envelope for you to review at your convenience later.  YOU SHOULD EXPECT: Some feelings of bloating in the abdomen. Passage of more gas than usual.  Walking can help get rid of the air that was put into your GI tract during the procedure and reduce the bloating. If you had a lower endoscopy (such as a colonoscopy or flexible sigmoidoscopy) you may notice spotting of blood in your stool or on the toilet paper. If you underwent a bowel prep for your procedure, then you may not have a normal bowel movement for a few days.  DIET: Your first meal following the procedure should be a light meal and then it is ok to progress to your normal diet.  A half-sandwich or bowl of soup is an example of a good first meal.  Heavy or fried foods are harder to digest and may make you feel nauseous or bloated.  Likewise meals heavy in dairy and vegetables can cause extra gas to form and this can also increase the bloating.  Drink plenty of fluids but you should avoid alcoholic beverages for 24 hours.  ACTIVITY: Your care partner should take you home directly after the procedure.  You should plan to take it easy, moving slowly for the rest of the day.  You can resume normal activity the day after the procedure however you should NOT DRIVE or use heavy machinery for 24 hours (because of the sedation medicines used during the test).     SYMPTOMS TO REPORT IMMEDIATELY: A gastroenterologist can be reached at any hour.  During normal business hours, 8:30 AM to 5:00 PM Monday through Friday, call [phone].  After hours and on weekends, please call the GI answering service at [phone] who will take a message and have the physician on call contact you.   Following lower endoscopy (colonoscopy or flexible sigmoidoscopy):  Excessive amounts of blood in the stool  Significant tenderness or worsening of abdominal pains  Swelling of the abdomen that is new, acute  Fever of 100F or higher  Following upper endoscopy (EGD)  Vomiting of blood or coffee ground material  New chest pain or pain under the shoulder blades  Painful or persistently difficult swallowing  New shortness of breath  Fever of 100F or higher  Black, tarry-looking stools  FOLLOW UP: If any biopsies were taken you will be contacted by phone or by letter within the next 1-3 weeks.  Call your gastroenterologist if you have not heard about the biopsies in 3 weeks.  Our staff will call the home number listed on your records the next business day following your procedure to check on you and address any questions or concerns that you may have at that time regarding the information given to you following your procedure. This is a courtesy call and so if there is no answer at the home number and we have not heard from you through the emergency  physician on call, we will assume that you have returned to your regular daily activities without incident.  SIGNATURES/CONFIDENTIALITY: You and/or your care partner have signed paperwork which will be entered into your electronic medical record.  These signatures attest to the fact that that the information above on your After Visit Summary has been reviewed and is understood.  Full responsibility of the confidentiality of this discharge information lies with you and/or your care-partner.

## 2012-05-27 NOTE — Progress Notes (Signed)
Patient did not want me to make his repeat colonoscopy at this time. Wife states she will have to talk him into it and they will call us back for that appointment. Encouraged patient to make this very important appointment.

## 2012-05-27 NOTE — Progress Notes (Signed)
Called to room to assist during endoscopic procedure.  Patient ID and intended procedure confirmed with present staff. Received instructions for my participation in the procedure from the performing physician. ewm 

## 2012-05-27 NOTE — Progress Notes (Signed)
Patient did not experience any of the following events: a burn prior to discharge; a fall within the facility; wrong site/side/patient/procedure/implant event; or a hospital transfer or hospital admission upon discharge from the facility. (G8907) Patient did not have preoperative order for IV antibiotic SSI prophylaxis. (G8918)  

## 2012-05-28 ENCOUNTER — Telehealth: Payer: Self-pay

## 2012-05-28 NOTE — Telephone Encounter (Signed)
  Follow up Call-  Call back number 05/27/2012  Post procedure Call Back phone  # [phone]  Permission to leave phone message Yes     Patient questions:  Do you have a fever, pain , or abdominal swelling? no Pain Score  0 *  Have you tolerated food without any problems? yes  Have you been able to return to your normal activities? yes  Do you have any questions about your discharge instructions: Diet   no Medications  no Follow up visit  no  Do you have questions or concerns about your Care? no  Actions: * If pain score is 4 or above: No action needed, pain <4.  Per the pt he was hoarse from the upper endoscopy, but no pain.  I advised him to call back if sx do not improve. Maw

## 2012-06-04 ENCOUNTER — Other Ambulatory Visit: Payer: Self-pay | Admitting: Internal Medicine

## 2012-06-04 DIAGNOSIS — K900 Celiac disease: Secondary | ICD-10-CM

## 2012-06-13 ENCOUNTER — Encounter: Payer: Self-pay | Admitting: Internal Medicine

## 2012-06-18 ENCOUNTER — Ambulatory Visit (INDEPENDENT_AMBULATORY_CARE_PROVIDER_SITE_OTHER): Payer: Medicare Other | Admitting: Internal Medicine

## 2012-06-18 ENCOUNTER — Other Ambulatory Visit (INDEPENDENT_AMBULATORY_CARE_PROVIDER_SITE_OTHER): Payer: Medicare Other

## 2012-06-18 ENCOUNTER — Encounter: Payer: Self-pay | Admitting: Internal Medicine

## 2012-06-18 VITALS — BP 118/62 | HR 74 | Ht 66.0 in | Wt 175.4 lb

## 2012-06-18 DIAGNOSIS — Z1211 Encounter for screening for malignant neoplasm of colon: Secondary | ICD-10-CM

## 2012-06-18 DIAGNOSIS — E611 Iron deficiency: Secondary | ICD-10-CM

## 2012-06-18 DIAGNOSIS — K900 Celiac disease: Secondary | ICD-10-CM

## 2012-06-18 DIAGNOSIS — D509 Iron deficiency anemia, unspecified: Secondary | ICD-10-CM

## 2012-06-18 LAB — CBC
HCT: 30 % — ABNORMAL LOW (ref 39.0–52.0)
MCV: 77.4 fl — ABNORMAL LOW (ref 78.0–100.0)
Platelets: 418 10*3/uL — ABNORMAL HIGH (ref 150.0–400.0)
RBC: 3.87 Mil/uL — ABNORMAL LOW (ref 4.22–5.81)

## 2012-06-18 NOTE — Patient Instructions (Addendum)
Your physician has requested that you go to the basement for  lab work before leaving today.  Follow up with Dr. Hilarie Fredrickson in 3 months

## 2012-06-19 ENCOUNTER — Encounter: Payer: Self-pay | Admitting: Internal Medicine

## 2012-06-19 ENCOUNTER — Telehealth: Payer: Self-pay | Admitting: *Deleted

## 2012-06-19 ENCOUNTER — Other Ambulatory Visit: Payer: Self-pay | Admitting: *Deleted

## 2012-06-19 DIAGNOSIS — D509 Iron deficiency anemia, unspecified: Secondary | ICD-10-CM

## 2012-06-19 DIAGNOSIS — K900 Celiac disease: Secondary | ICD-10-CM | POA: Insufficient documentation

## 2012-06-19 LAB — CERULOPLASMIN: Ceruloplasmin: 24 mg/dL (ref 20–60)

## 2012-06-19 NOTE — Telephone Encounter (Signed)
Per Dr Hilarie Fredrickson, schedule pt for IV Iron. Informed pt of 1st infusion on 06/25/12 at 10:30am at [facility]. Gave pt instructions and directions.

## 2012-06-19 NOTE — Progress Notes (Signed)
Patient ID: [name] Fernandez, male   DOB: 1940/10/23, 72 y.o.   MRN: [id number]  SUBJECTIVE: HPI [name] Fernandez is a 72 year old male with a past medical history of GERD status post Nissen fundoplication and repair of Nissen fundoplication [id number], hypothyroidism, and iron deficiency anemia who seen in followup. The patient was initially seen in November 2013 and the decision was made to proceed with EGD and colonoscopy. EGD was performed on 05/27/2012 and revealed tortuous distal esophagus with evidence of prior fundoplication, normal-appearing stomach, and mucosal changes in the small bowel suspicious for celiac disease including decreased fold, scalloping and flattening of the duodenal mucosa. Pathology showed variable to near total villous blunting with intraepithelial lymphocytes consistent with celiac disease.  Colonoscopy was attempted on the same day about aborted due to poor preparation.  He returns today for followup. He is otherwise feeling well. He denies abdominal pain, nausea, vomiting, bloating. Bowel habits have remained normal and he is still using Senokot to help keep his bowels regular. No rectal bleeding or melena no fevers or chills. No rashes. He does have itching bilateral lower calves.  No new joint pains continues to be troubled by his right knee previously replaced. He does continue to report fatigue and decreased stamina  Review of Systems  As per history of present illness, otherwise negative   Past Medical History  Diagnosis Date  . Hypothyroidism   . Shortness of breath     from oxycodone at times  . H/O hiatal hernia   . Hiatal hernia   . Esophageal stricture     Current Outpatient Prescriptions  Medication Sig Dispense Refill  . Cholecalciferol 5000 UNITS capsule Take 10,000 Units by mouth daily.      . cyanocobalamin 1000 MCG tablet Take 100 mcg by mouth daily.      . furosemide (LASIX) 40 MG tablet Take 40 mg by mouth daily.      Marland Kitchen levothyroxine (SYNTHROID,  LEVOTHROID) 75 MCG tablet Take 75 mcg by mouth daily before breakfast.      . MILK THISTLE PO Take 1 capsule by mouth daily. 295m each.      . Multiple Vitamin (MULITIVITAMIN WITH MINERALS) TABS Take 1 tablet by mouth daily.      . NONFORMULARY OR COMPOUNDED ITEM Apply 2 each topically daily. Testosterone cream 10%. Applies 234mdaily.      . Marland Kitchenmeprazole (PRILOSEC) 20 MG capsule Take 1 capsule (20 mg total) by mouth daily.  90 capsule  3  . OVER THE COUNTER MEDICATION Take 1 tablet by mouth daily. Mega Magnesium -  Multiple magnesium ingredients.-(magnesium from amino acids chelate,citrate and malate 40726g, malic acid 22203g      . OVER THE COUNTER MEDICATION Take 1 tablet by mouth daily. "Skeletal Strength" herbal supplement      . OxyCODONE HCl ER 30 MG T12A Take 1 capsule by mouth every 4 (four) hours.      . Red Yeast Rice 600 MG CAPS Take 2 capsules by mouth daily.       . sennosides-docusate sodium (SENOKOT-S) 8.6-50 MG tablet Take 1 tablet by mouth at bedtime. Takes 4-8 per day depending on symptoms of constipation      . diphenhydrAMINE (BENADRYL) 25 mg capsule Take 1 capsule (25 mg total) by mouth every 6 (six) hours as needed for itching, allergies or sleep.        No Known Allergies  Family History  Problem Relation Age of Onset  . Esophageal cancer Father  History  Substance Use Topics  . Smoking status: Former Smoker -- 1.0 packs/day for 20 years    Types: Cigarettes    Quit date: 06/27/1980  . Smokeless tobacco: Never Used  . Alcohol Use: No    OBJECTIVE: BP 118/62  Pulse 74  Ht 5' 6"  (1.676 m)  Wt 175 lb 6.4 oz (79.561 kg)  BMI 28.31 kg/m2 Constitutional: Well-developed and well-nourished. No distress.  HEENT: Normocephalic and atraumatic.  No scleral icterus.  Neurological: Alert and oriented to person place and time.  Skin: Skin is warm and dry. No rashes noted.  Psychiatric: Normal mood and affect. Behavior is normal.  Labs and Imaging -- Celiac panel  pending along with other Vitamin testing, repeat iron studies pending.  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  Celiac disease/iron deficiency anemia -- celiac disease help explain his iron deficiency anemia and we have spent a great deal of time discussing celiac disease and implications of this disease. He has some printed out material from the [facility] website and we discussed the importance of a strict gluten-free diet in order for his small bowel to heal. He remains skeptical that he will be able to adhere to a gluten-free diet. He also points out that he has very few GI complaints at this time, making it more difficult to make a major lifestyle/dietary change.  I've recommended a nutrition consult but he declines for now despite the insistence of his wife. I have written down the http://[employer]/ website and encouraged him to read more. I would like to check a tissue transglutaminase antibody today, along with other vitamin and mineral testing and repeat iron stores.  I have recommended IV iron for replacement and he is agreeable to this.  2.  Colon cancer screening -- he had a incomplete colonoscopy due to poor prep in is due for colon cancer screening. We discussed rescheduling this today, and he says he cannot do this right now. He understands my recommendation that this test to be done, and we can discuss this more at followup  I will see him back in 3 months time

## 2012-06-20 ENCOUNTER — Other Ambulatory Visit (INDEPENDENT_AMBULATORY_CARE_PROVIDER_SITE_OTHER): Payer: Medicare Other

## 2012-06-20 ENCOUNTER — Telehealth: Payer: Self-pay | Admitting: Gastroenterology

## 2012-06-20 DIAGNOSIS — K900 Celiac disease: Secondary | ICD-10-CM

## 2012-06-20 NOTE — Telephone Encounter (Signed)
Spoke to pt. Told him for some reason the lab did not draw his Celiac panel that Dr. Hilarie Fredrickson ordered, asked him if he could come in to get them drawn, he said it would be no problem he will stop up within the hour.

## 2012-06-21 LAB — VITAMIN A: Vitamin A (Retinoic Acid): 43 ug/dL (ref 38–98)

## 2012-06-21 LAB — VITAMIN E: Vitamin E (Alpha Tocopherol): 10.2 mg/L (ref 5.7–19.9)

## 2012-06-21 LAB — TISSUE TRANSGLUTAMINASE, IGA: Tissue Transglutaminase Ab, IgA: 105 U/mL — ABNORMAL HIGH (ref <20)

## 2012-06-21 LAB — CAROTENE, SERUM: Carotene, Total-Serum: 8 ug/dL (ref 4–51)

## 2012-06-25 ENCOUNTER — Encounter (HOSPITAL_COMMUNITY)
Admission: RE | Admit: 2012-06-25 | Discharge: 2012-06-25 | Disposition: A | Discharge status: Home / Self Care | Payer: Medicare Other | Source: Ambulatory Visit | Attending: Internal Medicine | Admitting: Internal Medicine

## 2012-06-25 ENCOUNTER — Telehealth: Payer: Self-pay | Admitting: *Deleted

## 2012-06-25 ENCOUNTER — Encounter (HOSPITAL_COMMUNITY): Payer: Self-pay

## 2012-06-25 VITALS — BP 132/64 | HR 68 | Temp 97.1°F | Resp 18 | Ht 66.5 in | Wt 175.4 lb

## 2012-06-25 DIAGNOSIS — D509 Iron deficiency anemia, unspecified: Secondary | ICD-10-CM

## 2012-06-25 HISTORY — DX: Celiac disease: K90.0

## 2012-06-25 MED ORDER — DIPHENHYDRAMINE HCL 25 MG PO TABS
25.0000 mg | ORAL_TABLET | Freq: Once | ORAL | Status: AC
  Administered 2012-06-25: 25 mg via ORAL
  Filled 2012-06-25: qty 1

## 2012-06-25 MED ORDER — FERUMOXYTOL INJECTION 510 MG/17 ML
510.0000 mg | Freq: Once | INTRAVENOUS | Status: AC
  Administered 2012-06-25: 510 mg via INTRAVENOUS
  Filled 2012-06-25: qty 17

## 2012-06-25 MED ORDER — DIPHENHYDRAMINE HCL 25 MG PO CAPS
ORAL_CAPSULE | ORAL | Status: AC
  Administered 2012-06-25: 25 mg via ORAL
  Filled 2012-06-25: qty 1

## 2012-06-25 MED ORDER — SODIUM CHLORIDE 0.9 % IV SOLN
INTRAVENOUS | Status: AC
  Administered 2012-06-25: 11:00:00 via INTRAVENOUS

## 2012-06-25 NOTE — Telephone Encounter (Signed)
Phoned pt to check on his hoarseness. Pt sounded "froggy" to me since I spoke with him yesterday. E stated he was doing great except his voice remains hoarse. He denies any problems with breathing, throat closure, lips swelling and trouble swallowing. He took a Benadryl when he left Short Stay and it finally kicked in. He states he's just lying around, dozing.  Instructed pt to call 911 for any of the problems listed above, day or night. I will call him in the am. Pt stated understanding.

## 2012-06-25 NOTE — Progress Notes (Addendum)
Pt her efor 1st Feraheme infusion. At the end of the 30 minute observation period post infusion pt states "I am a little hoarse in my voice" and my face feels flushed denies any trouble swallowing or tightness of throat. VSS

## 2012-06-25 NOTE — Telephone Encounter (Signed)
Premedication with Benadryl will likely help if he is willing to undergo the second dose

## 2012-06-25 NOTE — Progress Notes (Addendum)
After another 15 minutes pt states the "hoarseness and flushed feeling of face was going away" . Pt denies any other c/o or unusual feelings. As pt was walking out of the department he says he still is having some hoarseness but no other problems.Reported this to Caren Griffins RN at Dr Garth Schlatter office and pt is to take 25 mg of Benadryl at home and call office if any other problems. Pt lives "20 minutes away" so orders received for po 25 mg Benadryl prior to discharge

## 2012-06-25 NOTE — Telephone Encounter (Signed)
Marcie Bal, RN in [place] [facility], reports pt had his 1st dose of IV Iron, Feraheme this am and did well. Patients are kept for 30 minutes post infusion and as his IV was removed, he c/o hoarseness and facial flushing, flushing was not evident to [place]. Marcie Bal kept him a little llonger while she call us. Vital Signs were stable, no facial flushing, no difficulty swallowing. She called back and asked if it's OK to give pt a dose of Benadryl to take with him and I stated yes. Pt states he takes Benadryl at home because one of his meds causes him to itch. Do you want to pre treat the pt with Benadryl prior to the 2nd dose of Feraheme next week? Thanks.

## 2012-06-26 NOTE — Telephone Encounter (Signed)
I would not re-dose with feraheme due this reaction. Other option IN-FED is reasonable.  Would premedicate. Thanks

## 2012-06-26 NOTE — Telephone Encounter (Signed)
This am, pt remains hoarse and states he feels as though he has a hangover from the Benadryl. He states his eyes feel scratchy and his lips were swollen this am although, they have gone down now. He has no problems swallowing and his throat is not sore, in fact he is drinking coffee and eating GLUTEN FREE cookies as we spoke. Mentioned he could have another type of IRON and we can pre medicate him. Please advise. Thanks.

## 2012-06-26 NOTE — Telephone Encounter (Signed)
Spoke with pt about scheduling an Iron Infusion and he is a little "Gun Shy" at the moment and wants to wait. Suggested we wait a while, recheck his iron levels and then decide. I will call in a month or 2 and recheck his levels if OK with Dr Hilarie Fredrickson. Pt stated understanding. Dr Hilarie Fredrickson, Ok to recheck iron studies in 2 months? Thanks.

## 2012-06-27 NOTE — Telephone Encounter (Signed)
Yes, okay to recheck iron studies

## 2012-07-02 ENCOUNTER — Encounter (HOSPITAL_COMMUNITY): Payer: Self-pay

## 2012-08-12 ENCOUNTER — Telehealth: Payer: Self-pay | Admitting: *Deleted

## 2012-08-12 DIAGNOSIS — D509 Iron deficiency anemia, unspecified: Secondary | ICD-10-CM

## 2012-08-12 DIAGNOSIS — K900 Celiac disease: Secondary | ICD-10-CM

## 2012-08-12 NOTE — Telephone Encounter (Signed)
Message copied by Lance Morin on Mon Aug 12, 2012  8:22 AM ------      Message from: Lance Morin      Created: Thu Jun 27, 2012 10:33 AM       Recheck iron studies after 1 infusion of feraheme ------

## 2012-08-26 NOTE — Telephone Encounter (Signed)
Informed pt of the need to repeat his Iron studies. Pt reminded me he only got 1 dose of the Feraheme d/t a reaction. He stated it took him a long time to get over the reaction; his lips cracked and he doesn't think he will do the IV Infed. Pt will come in for repeat labs.

## 2012-09-04 ENCOUNTER — Telehealth: Payer: Self-pay | Admitting: *Deleted

## 2012-09-04 ENCOUNTER — Other Ambulatory Visit (INDEPENDENT_AMBULATORY_CARE_PROVIDER_SITE_OTHER): Payer: Medicare Other

## 2012-09-04 DIAGNOSIS — D509 Iron deficiency anemia, unspecified: Secondary | ICD-10-CM

## 2012-09-04 DIAGNOSIS — K900 Celiac disease: Secondary | ICD-10-CM

## 2012-09-04 LAB — FOLATE: Folate: 14.5 ng/mL (ref >5.9)

## 2012-09-04 LAB — IBC PANEL
Iron: 31 ug/dL — ABNORMAL LOW (ref 42–165)
Transferrin: 305.3 mg/dL (ref 212.0–360.0)

## 2012-09-04 NOTE — Telephone Encounter (Signed)
Agree.  Await lab testing today including iron studies. If still iron deficient we'll refer to hematology for iron infusion Ultimately he needs strict gluten-free diet for small bowel mucosal healing which is the most likely source of his iron deficiency He also needs colonoscopy repeated due to poor prep

## 2012-09-04 NOTE — Telephone Encounter (Signed)
Pt came in to lobby after having his labs done. He reports he's very tired, has no energy. He asked about the all day iron - INFED- and he is interested, but his wife is very reluctant and afraid for him to have it. Informed him Dr Hilarie Fredrickson will never order IV Iron again since his reaction. He will refer him to a hematologist and they will administer and probably pre medicate him. Pt states he will see what the labs show, but he may want a referral.

## 2012-09-05 LAB — CBC WITH DIFFERENTIAL/PLATELET
Lymphocytes Relative: 36 % (ref 12.0–46.0)
MCHC: 31.8 g/dL (ref 30.0–36.0)
MCV: 84.3 fl (ref 78.0–100.0)
Neutrophils Relative %: 50 % (ref 43.0–77.0)
RBC: 4.18 Mil/uL — ABNORMAL LOW (ref 4.22–5.81)
RDW: 26.4 % — ABNORMAL HIGH (ref 11.5–14.6)

## 2012-09-12 ENCOUNTER — Telehealth: Payer: Self-pay | Admitting: Internal Medicine

## 2012-09-12 ENCOUNTER — Telehealth: Payer: Self-pay | Admitting: *Deleted

## 2012-09-12 DIAGNOSIS — D649 Anemia, unspecified: Secondary | ICD-10-CM

## 2012-09-12 DIAGNOSIS — K900 Celiac disease: Secondary | ICD-10-CM

## 2012-09-12 NOTE — Telephone Encounter (Signed)
Left pt vm in ref to np appt.

## 2012-09-12 NOTE — Telephone Encounter (Signed)
Hematology referral for hopefully IV Infed infusion. Pt had a reaction to Feraheme in Medical Day. Pt has Celiac Disease, but is so weak he is willing to thy the Infed. He did have improvement in HGB and less fatigue with the Feraheme. Spoke with pt and informed him of Dr Vena Rua findings and recommendations. He would like to try the [facility] hoping he could feel better and we discussed Celiac Disease and absorption problems. He is willing to follow the diet more intensely; wants to feel better. Pt also wants to make sure this will be pain for by his insurance and I informed him most of the time visits and infusions are pre approved or certified.

## 2012-09-13 ENCOUNTER — Telehealth: Payer: Self-pay | Admitting: Internal Medicine

## 2012-09-13 NOTE — Telephone Encounter (Signed)
C/D 09/13/12 for appt. 10/15/12

## 2012-09-13 NOTE — Telephone Encounter (Signed)
S/W PT IN REF TO NP APPT. ON 10/15/12@1 :71 REFERRING DR Dellia Cloud MAILED NP PACKET

## 2012-09-25 ENCOUNTER — Telehealth: Payer: Self-pay | Admitting: Internal Medicine

## 2012-09-25 NOTE — Telephone Encounter (Signed)
Pt called back questioning his upcoming hematology visit; he is concerned about having to see another md about the same problem. Explained a hematologist  Works on iron problems and the [facility] infusion nurses are used to giving IB Iron to pts and are better equipped for reactions and doctors are across the hall from the infusion station. Also, the pharmacist is in the area also. I do not want to pressure him into having the Iron, but the one dose of Feraheme improved his H&H and pt stated he did see improvement in his fatigue. He needs to have a knee revision and we discussed the blood loss with knee surgery and he will probably do better after the surgery as far as his strength. Pt wants to make sure insurance will cover the infusion. He will call and I suggested he ask about INFED or Iron Dextran; pt stated understanding.

## 2012-10-15 ENCOUNTER — Encounter: Payer: Self-pay | Admitting: Internal Medicine

## 2012-10-15 ENCOUNTER — Other Ambulatory Visit (HOSPITAL_BASED_OUTPATIENT_CLINIC_OR_DEPARTMENT_OTHER): Payer: Medicare Other | Admitting: Lab

## 2012-10-15 ENCOUNTER — Ambulatory Visit: Payer: Medicare Other

## 2012-10-15 ENCOUNTER — Telehealth: Payer: Self-pay | Admitting: Internal Medicine

## 2012-10-15 ENCOUNTER — Ambulatory Visit (HOSPITAL_BASED_OUTPATIENT_CLINIC_OR_DEPARTMENT_OTHER): Payer: Medicare Other | Admitting: Internal Medicine

## 2012-10-15 VITALS — BP 158/71 | HR 72 | Temp 98.1°F | Resp 17 | Ht 66.0 in | Wt 174.4 lb

## 2012-10-15 DIAGNOSIS — K900 Celiac disease: Secondary | ICD-10-CM

## 2012-10-15 DIAGNOSIS — D539 Nutritional anemia, unspecified: Secondary | ICD-10-CM

## 2012-10-15 LAB — CBC & DIFF AND RETIC
Basophils Absolute: 0 10*3/uL (ref 0.0–0.1)
EOS%: 2.4 % (ref 0.0–7.0)
HCT: 35.5 % — ABNORMAL LOW (ref 38.4–49.9)
HGB: 11.5 g/dL — ABNORMAL LOW (ref 13.0–17.1)
MCH: 27.5 pg (ref 27.2–33.4)
MCV: 84.9 fL (ref 79.3–98.0)
MONO%: 7.2 % (ref 0.0–14.0)
NEUT%: 48.7 % (ref 39.0–75.0)
Platelets: 363 10*3/uL (ref 140–400)

## 2012-10-15 LAB — COMPREHENSIVE METABOLIC PANEL (CC13)
ALT: 43 U/L (ref 0–55)
AST: 45 U/L — ABNORMAL HIGH (ref 5–34)
Alkaline Phosphatase: 110 U/L (ref 40–150)
Creatinine: 1.2 mg/dL (ref 0.7–1.3)
Total Bilirubin: 0.65 mg/dL (ref 0.20–1.20)

## 2012-10-15 NOTE — Progress Notes (Signed)
[phone]   [phone]  CONSULT NOTE  REFERRING PHYSICIAN: Dr. Ulice Dash Pyrtle  REASON FOR CONSULTATION:  72 years old white male with Iron deficiency anemia  HPI [name] Fernandez is a 72 y.o. male was past medical history significant for hiatal hernia, esophageal stricture, celiac disease as well as chronic back pain status post surgery. The patient has been complaining of increasing fatigue and weakness over the last 2 years. He was seen by his primary care physician Dr. Melford Aase and CBC on 11/21/2011 showed low hemoglobin of 8.1 and hematocrit 24.9%. The patient was referred to Dr. Hilarie Fredrickson for gastrointestinal evaluation. He had upper endoscopy as well as colonoscopy on 05/27/2012 which showed mucosal changes suspicious for celiac disease in the duodenal bulb and second portion of the duodenum. I study on 06/18/2012 showed iron level was low at 23 with iron saturation of 4.7%, ferritin level was 9.3. IgA was elevated at 474 tissue transglutaminase antibody, IgA was elevated at 105.0, consistent with celiac disease. The patient was given one dose of Feraheme infusion by Dr. Hilarie Fredrickson at the short stay at [facility]. This was complicated by lip swelling as well as hoarseness of his voice and blisters in his mouth for almost a month after the infusion. The second dose of this treatment was discontinued at that time. He has improvement in his hemoglobin and hematocrit after the iron infusion. On 09/04/2012 his hemoglobin was 11.1 hematocrit 35.2%.  He was referred to me today for further evaluation and recommendation regarding iron infusion again with a different formulation. The patient is feeling fine today except for mild fatigue and shortness breath with exertion. 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 He also has chronic back pain and currently on treatment with oxycodone and followed  by the pain clinic. Has a history of weight loss of around 30 pounds over the last few months. Family history significant for her father who died from esophageal cancer at age 30 and a mother who died at age 59 during childbirth. The patient is married and has 2 daughters. He is currently retired Customer service manager. He has a history of smoking but quit in 1984. He has no history of alcohol drug abuse. @SFHPI @  Past Medical History  Diagnosis Date  . Hypothyroidism   . Shortness of breath     from oxycodone at times  . H/O hiatal hernia   . Hiatal hernia   . Esophageal stricture   . Celiac disease     diagnoses 06/24/12    Past Surgical History  Procedure Laterality Date  . 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 Back surgery      3 diff surgeries for vertebrea broken  . Tonsillectomy      as child  . Eye surgery      bilateral cataract surgery  . Hiatal hernia repair    . Hernia repair  1950's    bilateral groin as child  . Total knee revision  11/20/2011    Procedure: TOTAL KNEE REVISION;  Surgeon: Mauri Pole, MD;  Location: [facility];  Service: Orthopedics;  Laterality: Right;  Right Total Knee Revision    Family History  Problem Relation Age of Onset  . Esophageal cancer Father     Social History History  Substance Use Topics  . Smoking status: Former Smoker -- 1.00 packs/day for 20 years  Types: Cigarettes    Quit date: 06/27/1980  . Smokeless tobacco: Never Used  . Alcohol Use: No    Allergies  Allergen Reactions  . Feraheme (Ferumoxytol) Other (See Comments)    Swelling of lips and tongue, could not talk 30 minutes after the infusion.    Current Outpatient Prescriptions  Medication Sig Dispense Refill  . Cholecalciferol 5000 UNITS capsule Take 10,000 Units by mouth daily.      . cyanocobalamin 1000 MCG tablet Take 100 mcg by mouth daily.      . diphenhydrAMINE (BENADRYL) 25 mg capsule Take 1 capsule (25 mg total) by mouth every 6 (six) hours  as needed for itching, allergies or sleep.      . furosemide (LASIX) 40 MG tablet Take 40 mg by mouth daily.      Marland Kitchen levothyroxine (SYNTHROID, LEVOTHROID) 75 MCG tablet Take 75 mcg by mouth daily before breakfast.      . MILK THISTLE PO Take 1 capsule by mouth daily. 223m each.      . Multiple Vitamin (MULITIVITAMIN WITH MINERALS) TABS Take 1 tablet by mouth daily.      . NONFORMULARY OR COMPOUNDED ITEM Apply 2 each topically daily. Testosterone cream 10%. Applies 258mdaily.      . Marland Kitchenmeprazole (PRILOSEC) 20 MG capsule Take 1 capsule (20 mg total) by mouth daily.  90 capsule  3  . OVER THE COUNTER MEDICATION Take 1 tablet by mouth daily. Mega Magnesium -  Multiple magnesium ingredients.-(magnesium from amino acids chelate,citrate and malate 40875g, malic acid 22643g      . OVER THE COUNTER MEDICATION Take 1 tablet by mouth daily. "Skeletal Strength" herbal supplement      . OxyCODONE HCl ER 30 MG T12A Take 1 capsule by mouth every 4 (four) hours.      . Red Yeast Rice 600 MG CAPS Take 2 capsules by mouth daily.       . sennosides-docusate sodium (SENOKOT-S) 8.6-50 MG tablet Take 1 tablet by mouth at bedtime. Takes 4-8 per day depending on symptoms of constipation       No current facility-administered medications for this visit.    Review of Systems  A comprehensive review of systems was negative except for: Constitutional: positive for fatigue and weight loss Respiratory: positive for dyspnea on exertion Musculoskeletal: positive for back pain and bone pain  Physical Exam  RAPIR:JJOAChealthy, no distress, well nourished and well developed SKIN: skin color, texture, turgor are normal HEAD: Normocephalic, No masses, lesions, tenderness or abnormalities EYES: normal EARS: External ears normal OROPHARYNX:no exudate and no erythema  NECK: supple, no adenopathy LYMPH:  no palpable lymphadenopathy, no hepatosplenomegaly LUNGS: clear to auscultation  HEART: regular rate & rhythm and no  murmurs ABDOMEN:abdomen soft, non-tender, normal bowel sounds and no masses or organomegaly BACK: Back symmetric, no curvature. EXTREMITIES:no joint deformities, effusion, or inflammation, no edema, no skin discoloration  NEURO: alert & oriented x 3 with fluent speech, no focal motor/sensory deficits  PERFORMANCE STATUS: ECOG 1  LABORATORY DATA: Lab Results  Component Value Date   WBC 5.9 10/15/2012   HGB 11.5* 10/15/2012   HCT 35.5* 10/15/2012   MCV 84.9 10/15/2012   PLT 363 10/15/2012      Chemistry      Component Value Date/Time   NA 133* 11/22/2011 0415   K 3.5 11/22/2011 0415   CL 97 11/22/2011 0415   CO2 28 11/22/2011 0415   BUN 13 11/22/2011 0415   CREATININE 0.97 11/22/2011  8346      Component Value Date/Time   CALCIUM 8.4 11/22/2011 0415       RADIOGRAPHIC STUDIES: No results found.  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 The patient has improvement in his hemoglobin and hematocrit after 1 dose of Feraheme infusion but unfortunately he has allergic reaction to this infusion.   PLAN: I have a lengthy discussion with the patient today about his condition and treatment options. He has mild anemia at this point but unfortunately he is symptomatic from his anemia. I discussed his condition with several of my partner as well as the [facility] pharmacists. The recommendation is to consider the patient for intravenous iron dextran infusion. He would be tried on a test dose first on 10/18/2012 and as directed we will proceed with the full dose of the infusion the following week. I discussed this recommendation with the patient and he understands that there is a high risk further infusion reaction especially with his previous experience with Feraheme infusion. He would like to proceed with treatment as planned. If the patient is able to tolerate the treatment as planned, I would  see him back for followup visit in 2 months with repeat CBC and iron study. He was advised to call immediately if he has any concerning symptoms in the interval. All questions were answered. The patient knows to call the clinic with any problems, questions or concerns. We can certainly see the patient much sooner if necessary.  Thank you so much for allowing me to participate in the care of [name] Fernandez. I will continue to follow up the patient with you and assist in his care.  I spent 30 minutes counseling the patient face to face. The total time spent in the appointment was 50 minutes.   Herby Amick K. 10/15/2012, 2:15 PM

## 2012-10-15 NOTE — Progress Notes (Signed)
Checked in new pt with no financial concerns. °

## 2012-10-15 NOTE — Patient Instructions (Signed)
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 We discussed treatment with iron dextran. We will start with a test dose this week and if tolerated we'll proceed with the full dose next week. Followup visit in 2 months with repeat iron study

## 2012-10-16 ENCOUNTER — Telehealth: Payer: Self-pay | Admitting: Internal Medicine

## 2012-10-16 NOTE — Telephone Encounter (Signed)
s.w. pt and advised on July 15 appt...pt ok and awae

## 2012-10-16 NOTE — Telephone Encounter (Signed)
pt called This pt called today and advised that he did not wish to have the iron on Friday.  The pt was still concerned about the possible rxn he may have to the tx.  Would you like for me to cancel? I emailed Dr. Julien Nordmann this info.

## 2012-10-17 ENCOUNTER — Telehealth: Payer: Self-pay | Admitting: Internal Medicine

## 2012-10-17 LAB — SPEP & IFE WITH QIG
Albumin ELP: 50.9 % — ABNORMAL LOW (ref 55.8–66.1)
Alpha-1-Globulin: 7.1 % — ABNORMAL HIGH (ref 2.9–4.9)
Beta 2: 5.4 % (ref 3.2–6.5)
IgM, Serum: 53 mg/dL (ref 41–251)
Total Protein, Serum Electrophoresis: 7.4 g/dL (ref 6.0–8.3)

## 2012-10-17 LAB — FERRITIN: Ferritin: 15 ng/mL — ABNORMAL LOW (ref 22–322)

## 2012-10-17 LAB — FOLATE: Folate: 8.9 ng/mL

## 2012-10-17 LAB — IRON AND TIBC
TIBC: 420 ug/dL (ref 215–435)
UIBC: 364 ug/dL (ref 125–400)

## 2012-10-17 NOTE — Telephone Encounter (Signed)
This pt called to day and advised that he ddid not wish to have the iron on Friday he was concerned with the possible rxn he may have to the tx...per Dr. Julien Nordmann cancel

## 2012-10-18 ENCOUNTER — Ambulatory Visit: Payer: Self-pay

## 2012-12-20 ENCOUNTER — Telehealth: Payer: Self-pay | Admitting: Internal Medicine

## 2012-12-20 NOTE — Telephone Encounter (Signed)
pt called and advised that he did not want to come back and to cancel his appts.Marland KitchenMarland KitchenMarland KitchenDone

## 2012-12-24 ENCOUNTER — Ambulatory Visit: Payer: Self-pay | Admitting: Internal Medicine

## 2012-12-24 ENCOUNTER — Other Ambulatory Visit: Payer: Self-pay | Admitting: Lab

## 2013-01-14 ENCOUNTER — Ambulatory Visit (INDEPENDENT_AMBULATORY_CARE_PROVIDER_SITE_OTHER): Payer: Medicare Other | Admitting: Emergency Medicine

## 2013-01-14 ENCOUNTER — Ambulatory Visit: Payer: Medicare Other

## 2013-01-14 VITALS — BP 108/58 | HR 63 | Temp 97.9°F | Resp 18 | Ht 65.5 in | Wt 173.0 lb

## 2013-01-14 DIAGNOSIS — M5432 Sciatica, left side: Secondary | ICD-10-CM

## 2013-01-14 DIAGNOSIS — M5430 Sciatica, unspecified side: Secondary | ICD-10-CM

## 2013-01-14 DIAGNOSIS — M79672 Pain in left foot: Secondary | ICD-10-CM

## 2013-01-14 MED ORDER — MELOXICAM 15 MG PO TABS: 15.0000 mg | ORAL_TABLET | Freq: Every day | ORAL | Status: DC

## 2013-01-14 NOTE — Patient Instructions (Addendum)
Radicular Pain Radicular pain in either the arm or leg is usually from a bulging or herniated disk in the spine. A piece of the herniated disk may press against the nerves as the nerves exit the spine. This causes pain which is felt at the tips of the nerves down the arm or leg. Other causes of radicular pain may include:  Fractures.  Heart disease.  Cancer.  An abnormal and usually degenerative state of the nervous system or nerves (neuropathy). Diagnosis may require CT or MRI scanning to determine the primary cause.  Nerves that start at the neck (nerve roots) may cause radicular pain in the outer shoulder and arm. It can spread down to the thumb and fingers. The symptoms vary depending on which nerve root has been affected. In most cases radicular pain improves with conservative treatment. Neck problems may require physical therapy, a neck collar, or cervical traction. Treatment may take many weeks, and surgery may be considered if the symptoms do not improve.  Conservative treatment is also recommended for sciatica. Sciatica causes pain to radiate from the lower back or buttock area down the leg into the foot. Often there is a history of back problems. Most patients with sciatica are better after 2 to 4 weeks of rest and other supportive care. Short term bed rest can reduce the disk pressure considerably. Sitting, however, is not a good position since this increases the pressure on the disk. You should avoid bending, lifting, and all other activities which make the problem worse. Traction can be used in severe cases. Surgery is usually reserved for patients who do not improve within the first months of treatment. Only take over-the-counter or prescription medicines for pain, discomfort, or fever as directed by your caregiver. Narcotics and muscle relaxants may help by relieving more severe pain and spasm and by providing mild sedation. Cold or massage can give significant relief. Spinal manipulation  is not recommended. It can increase the degree of disc protrusion. Epidural steroid injections are often effective treatment for radicular pain. These injections deliver medicine to the spinal nerve in the space between the protective covering of the spinal cord and back bones (vertebrae). Your caregiver can give you more information about steroid injections. These injections are most effective when given within two weeks of the onset of pain.  You should see your caregiver for follow up care as recommended. A program for neck and back injury rehabilitation with stretching and strengthening exercises is an important part of management.  SEEK IMMEDIATE MEDICAL CARE IF:  You develop increased pain, weakness, or numbness in your arm or leg.  You develop difficulty with bladder or bowel control.  You develop abdominal pain. Document Released: 07/06/2004 Document Revised: 08/21/2011 Document Reviewed: 09/21/2008 [facility] Patient Information 2014 [place], [place].

## 2013-01-14 NOTE — Progress Notes (Signed)
[facility] [address] [place] [address] [phone]  Date:  01/14/2013   Name:  [name] Fernandez   DOB:  1940-11-14   MRN:  [id number]  PCP:  Alesia Richards, MD    Chief Complaint: Foot Pain   History of Present Illness:  [name] Fernandez is a 72 y.o. very pleasant male patient who presents with the following:  Three week history of throbbing burning pain in left heel.  No history of injury or overuse.  Has history of back injury and surgery.  Pain is constant and not related to activity and not relieved by anything.  No relief with pain medication.  No improvement with over the counter medications or other home remedies. Denies other complaint or health concern today.   Patient Active Problem List   Diagnosis Date Noted  . Celiac disease 06/19/2012  . GERD (gastroesophageal reflux disease) 04/30/2012  . 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 Hypothyroidism 04/30/2012  . 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 Hypothyroidism   . Shortness of breath     from oxycodone at times  . H/O hiatal hernia   . Hiatal hernia   . Esophageal stricture   . Celiac disease     diagnoses 06/24/12    Past Surgical History  Procedure Laterality Date  . 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 Back surgery      3 diff surgeries for vertebrea broken  . Tonsillectomy      as child  . Eye surgery      bilateral cataract surgery  . Hiatal hernia repair    . Hernia repair  1950's    bilateral groin as child  . 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 Smoking status: Former Smoker -- 1.00 packs/day for 20 years    Types: Cigarettes    Quit date: 06/27/1980  . Smokeless tobacco: Never  Used  . Alcohol Use: No    Family History  Problem Relation Age of Onset  . Esophageal cancer Father     Allergies  Allergen Reactions  . Feraheme (Ferumoxytol) Other (See Comments)    Swelling of lips and tongue, could not talk 30 minutes after the infusion.    Medication list has been reviewed and updated.  Current Outpatient Prescriptions on File Prior to Visit  Medication Sig Dispense Refill  . Cholecalciferol 5000 UNITS capsule Take 5,000 Units by mouth daily.       . cyanocobalamin 1000 MCG tablet Take 100 mcg by mouth daily.      . furosemide (LASIX) 40 MG tablet Take 40 mg by mouth daily.      . IODINE, KELP, PO Take 1 tablet by mouth daily.      . Multiple Vitamin (MULITIVITAMIN WITH MINERALS) TABS Take 1 tablet by mouth daily.      Marland Kitchen omeprazole (PRILOSEC) 20 MG capsule Take 1 capsule (20 mg total) by mouth daily.  90 capsule  3  . OVER THE COUNTER MEDICATION Take 1 tablet by mouth daily. "Skeletal Strength" herbal supplement      . Oxycodone HCl 20 MG TABS Take by mouth every 4 (four) hours as needed.      Marland Kitchen  sennosides-docusate sodium (SENOKOT-S) 8.6-50 MG tablet Take 1 tablet by mouth at bedtime. Takes 4-8 per day depending on symptoms of constipation      . vitamin C (ASCORBIC ACID) 500 MG tablet Take 500 mg by mouth daily.      . vitamin E 400 UNIT capsule Take 400 Units by mouth daily.      . Zinc 30 MG CAPS Take 1 capsule by mouth daily.      . NONFORMULARY OR COMPOUNDED ITEM Apply 2 each topically daily. Testosterone cream 10%. Applies 39m daily.      . Red Yeast Rice 600 MG CAPS Take 2 capsules by mouth daily.        No current facility-administered medications on file prior to visit.    Review of Systems:  As per HPI, otherwise negative.    Physical Examination: Filed Vitals:   01/14/13 1448  BP: 108/58  Pulse: 63  Temp: 97.9 F (36.6 C)  Resp: 18   Filed Vitals:   01/14/13 1448  Height: 5' 5.5" (1.664 m)  Weight: 173 lb (78.472 kg)   Body  mass index is 28.34 kg/(m^2). Ideal Body Weight: Weight in (lb) to have BMI = 25: 152.2   GEN: WDWN, NAD, Non-toxic, Alert & Oriented x 3 HEENT: Atraumatic, Normocephalic.  Ears and Nose: No external deformity. EXTR: No clubbing/cyanosis/edema.  No tenderness or ecchymosis heel. NEURO: antalgic assisted gait.  PSYCH: Normally interactive. Conversant. Not depressed or anxious appearing.  Calm demeanor.  BACK:  Not tender.  SLR positive on left.   Assessment and Plan: Sciatic neuritis MRI mobic  Signed,  JEllison Carwin MD  [facility] reading (PRIMARY) by  Dr. AOuida Sills  Heel negative.

## 2013-01-21 ENCOUNTER — Telehealth: Payer: Self-pay

## 2013-01-21 MED ORDER — LORAZEPAM 1 MG PO TABS: 2.0000 mg | ORAL_TABLET | Freq: Once | ORAL | Status: DC

## 2013-01-21 NOTE — Telephone Encounter (Signed)
Pended please advise.  

## 2013-01-21 NOTE — Telephone Encounter (Signed)
Please call him in ativan 2 mg to take 30 minutes prior to scan.  Someone must drive him home

## 2013-01-21 NOTE — Telephone Encounter (Signed)
Phoned in for him. Patient advised

## 2013-01-21 NOTE — Telephone Encounter (Signed)
PT STATES HE IS TO HAVE AN MRI DONE ON Friday, BUT NEED TO HAVE A SEDATIVE SINCE IT IS IN A SMALL SPACE PLEASE CALL [phone]    [facility] ON [address]

## 2013-01-24 ENCOUNTER — Ambulatory Visit
Admission: RE | Admit: 2013-01-24 | Discharge: 2013-01-24 | Disposition: A | Discharge status: Home / Self Care | Payer: Medicare Other | Source: Ambulatory Visit | Attending: Emergency Medicine | Admitting: Emergency Medicine

## 2013-01-24 DIAGNOSIS — M5432 Sciatica, left side: Secondary | ICD-10-CM

## 2013-01-27 ENCOUNTER — Telehealth: Payer: Self-pay

## 2013-01-27 NOTE — Telephone Encounter (Signed)
Patient was calling to see if the results were in for his mri please call him at [phone]

## 2013-01-28 NOTE — Telephone Encounter (Signed)
Called pt, advised his MRI results. Since it was negative what is the next step? He is still in a lot of pain. Please advise

## 2013-01-28 NOTE — Telephone Encounter (Signed)
Needs to come back in

## 2013-01-29 NOTE — Telephone Encounter (Signed)
Spoke with male she asked to call back.

## 2013-01-30 NOTE — Telephone Encounter (Signed)
Spoke with pt, advised to RTC. He will come in soon.

## 2013-05-14 ENCOUNTER — Other Ambulatory Visit: Payer: Self-pay | Admitting: Internal Medicine

## 2013-05-19 ENCOUNTER — Encounter: Payer: Self-pay | Admitting: Internal Medicine

## 2013-05-19 DIAGNOSIS — I10 Essential (primary) hypertension: Secondary | ICD-10-CM | POA: Insufficient documentation

## 2013-05-19 DIAGNOSIS — R7309 Other abnormal glucose: Secondary | ICD-10-CM | POA: Insufficient documentation

## 2013-05-19 DIAGNOSIS — E538 Deficiency of other specified B group vitamins: Secondary | ICD-10-CM | POA: Insufficient documentation

## 2013-05-19 DIAGNOSIS — E559 Vitamin D deficiency, unspecified: Secondary | ICD-10-CM | POA: Insufficient documentation

## 2013-05-19 DIAGNOSIS — M1990 Unspecified osteoarthritis, unspecified site: Secondary | ICD-10-CM | POA: Insufficient documentation

## 2013-05-20 ENCOUNTER — Encounter: Payer: Self-pay | Admitting: Physician Assistant

## 2013-05-20 ENCOUNTER — Ambulatory Visit (INDEPENDENT_AMBULATORY_CARE_PROVIDER_SITE_OTHER): Payer: Medicare Other | Admitting: Physician Assistant

## 2013-05-20 VITALS — BP 128/62 | HR 68 | Temp 98.3°F | Resp 16 | Ht 66.0 in | Wt 181.0 lb

## 2013-05-20 DIAGNOSIS — E538 Deficiency of other specified B group vitamins: Secondary | ICD-10-CM

## 2013-05-20 DIAGNOSIS — E785 Hyperlipidemia, unspecified: Secondary | ICD-10-CM

## 2013-05-20 DIAGNOSIS — Z79899 Other long term (current) drug therapy: Secondary | ICD-10-CM

## 2013-05-20 DIAGNOSIS — R7303 Prediabetes: Secondary | ICD-10-CM

## 2013-05-20 DIAGNOSIS — E039 Hypothyroidism, unspecified: Secondary | ICD-10-CM

## 2013-05-20 DIAGNOSIS — D649 Anemia, unspecified: Secondary | ICD-10-CM

## 2013-05-20 DIAGNOSIS — E559 Vitamin D deficiency, unspecified: Secondary | ICD-10-CM

## 2013-05-20 DIAGNOSIS — Z0000 Encounter for general adult medical examination without abnormal findings: Secondary | ICD-10-CM

## 2013-05-20 DIAGNOSIS — Z125 Encounter for screening for malignant neoplasm of prostate: Secondary | ICD-10-CM

## 2013-05-20 DIAGNOSIS — I10 Essential (primary) hypertension: Secondary | ICD-10-CM

## 2013-05-20 LAB — BASIC METABOLIC PANEL WITH GFR
BUN: 10 mg/dL (ref 6–23)
CO2: 32 mEq/L (ref 19–32)
Calcium: 9.3 mg/dL (ref 8.4–10.5)
GFR, Est African American: 83 mL/min
Glucose, Bld: 95 mg/dL (ref 70–99)
Potassium: 4.4 mEq/L (ref 3.5–5.3)

## 2013-05-20 LAB — MAGNESIUM: Magnesium: 2 mg/dL (ref 1.5–2.5)

## 2013-05-20 LAB — CBC WITH DIFFERENTIAL/PLATELET
Basophils Relative: 0 % (ref 0–1)
Eosinophils Absolute: 0.2 10*3/uL (ref 0.0–0.7)
HCT: 34.7 % — ABNORMAL LOW (ref 39.0–52.0)
Hemoglobin: 11.4 g/dL — ABNORMAL LOW (ref 13.0–17.0)
Lymphs Abs: 2.5 10*3/uL (ref 0.7–4.0)
MCH: 28.4 pg (ref 26.0–34.0)
MCHC: 32.9 g/dL (ref 30.0–36.0)
Monocytes Absolute: 0.8 10*3/uL (ref 0.1–1.0)
Monocytes Relative: 12 % (ref 3–12)

## 2013-05-20 LAB — LIPID PANEL
LDL Cholesterol: 110 mg/dL — ABNORMAL HIGH (ref 0–99)
Triglycerides: 126 mg/dL (ref <150)
VLDL: 25 mg/dL (ref 0–40)

## 2013-05-20 LAB — IRON AND TIBC
Iron: 36 ug/dL — ABNORMAL LOW (ref 42–165)
TIBC: 396 ug/dL (ref 215–435)
UIBC: 360 ug/dL (ref 125–400)

## 2013-05-20 LAB — HEPATIC FUNCTION PANEL
ALT: 22 U/L (ref 0–53)
AST: 29 U/L (ref 0–37)
Indirect Bilirubin: 0.6 mg/dL (ref 0.0–0.9)
Total Protein: 7.4 g/dL (ref 6.0–8.3)

## 2013-05-20 LAB — URIC ACID: Uric Acid, Serum: 6.8 mg/dL (ref 4.0–7.8)

## 2013-05-20 LAB — HEMOGLOBIN A1C
Hgb A1c MFr Bld: 6.4 % — ABNORMAL HIGH (ref <5.7)
Mean Plasma Glucose: 137 mg/dL — ABNORMAL HIGH (ref <117)

## 2013-05-20 LAB — TSH: TSH: 5.715 u[IU]/mL — ABNORMAL HIGH (ref 0.350–4.500)

## 2013-05-20 LAB — VITAMIN B12: Vitamin B-12: 1982 pg/mL — ABNORMAL HIGH (ref 211–911)

## 2013-05-20 MED ORDER — METOLAZONE 5 MG PO TABS: ORAL_TABLET | ORAL | Status: DC

## 2013-05-20 MED ORDER — CLOTRIMAZOLE-BETAMETHASONE 1-0.05 % EX CREA: 1.0000 "application " | TOPICAL_CREAM | Freq: Two times a day (BID) | CUTANEOUS | Status: DC

## 2013-05-20 MED ORDER — PREDNISONE 20 MG PO TABS: ORAL_TABLET | ORAL | Status: DC

## 2013-05-20 NOTE — Progress Notes (Signed)
Complete Physical HPI Patient presents for complete physical.   Patient's blood pressure has been controlled at home. Patient denies chest pain, shortness of breath, dizziness. BP: 128/62 mmHg  He has bilateral leg swelling, with itching. He tried to decrease his oxycodone to decrease the swelling however this did not help and he states that now he is itching all over the place, denies rash.  Patient's cholesterol is diet controlled. They are on RYRE and denies myalgias. The patient's cholesterol last visit was LDL was 130, HDL was 31.  The patient has been working on diet and exercise for prediabetes, denies changes in vision, polys.. Last A1C in office was 6.1. Patient has recently been seen by Dr. Rogue Jury for iron def and had an iron IV on Jan 2014, he also recently had a colonoscopy and EGD by Dr. Hilarie Fredrickson that shows celiac disease. He continues to have fatigue and states that he does not feel much better. I have explained the disease process of celiac and it is a systemic inflammatory response.  Patient complains of right knee pain, he has had it replaced twice. He saw his ortho doctor, Dr. Alvan Dame, in Oct and he states that he can not do anymore for him. He states that it is very swollen and tight in his right knee. He sees his pain doctor for his back.   Current Medications:  Current Outpatient Prescriptions on File Prior to Visit  Medication Sig Dispense Refill  . Cholecalciferol 5000 UNITS capsule Take 5,000 Units by mouth daily.       . cyanocobalamin 1000 MCG tablet Take 100 mcg by mouth daily.      . furosemide (LASIX) 40 MG tablet Take 40 mg by mouth daily.      . IODINE, KELP, PO Take 1 tablet by mouth daily.      Marland Kitchen MAGNESIUM PO Take by mouth.      . NONFORMULARY OR COMPOUNDED ITEM Apply 2 each topically daily. Testosterone cream 10%. Applies 20m daily.      .Marland Kitchenomeprazole (PRILOSEC) 20 MG capsule Take 1 capsule (20 mg total) by mouth daily.  90 capsule  3  . OVER THE COUNTER MEDICATION  Take 1 tablet by mouth daily. "Skeletal Strength" herbal supplement      . Oxycodone HCl 20 MG TABS Take by mouth every 4 (four) hours as needed.      . Red Yeast Rice 600 MG CAPS Take 2 capsules by mouth daily.       . sennosides-docusate sodium (SENOKOT-S) 8.6-50 MG tablet Take 1 tablet by mouth at bedtime. Takes 4-8 per day depending on symptoms of constipation      . vitamin E 400 UNIT capsule Take 400 Units by mouth daily.       No current facility-administered medications on file prior to visit.   Health Maintenance:  Tetanus: 2012 Pneumovax: 2009 Flu vaccine: 04/2013 Zostavax: declines Colonoscopy: 05/2012  Dr. PHilarie FredricksonEGD: 05/2012 Allergies:  Allergies  Allergen Reactions  . Feraheme [Ferumoxytol] Other (See Comments)    Swelling of lips and tongue, could not talk 30 minutes after the infusion.  . Naprosyn [Naproxen]    Medical History:  Past Medical History  Diagnosis Date  . Hypothyroidism   . Shortness of breath     from oxycodone at times  . H/O hiatal hernia   . Hiatal hernia   . Esophageal stricture   . Celiac disease     diagnoses 06/24/12  . Hypertension   . Anemia   .  Prediabetes   . Vitamin D deficiency   . Arthritis   . Vitamin B12 deficiency    Surgical History:  Past Surgical History  Procedure Laterality Date  . 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 Back surgery      3 diff surgeries for vertebrea broken  . Tonsillectomy      as child  . Eye surgery      bilateral cataract surgery  . Hiatal hernia repair    . Hernia repair  1950's    bilateral groin as child  . Total knee revision  11/20/2011    Procedure: TOTAL KNEE REVISION;  Surgeon: Mauri Pole, MD;  Location: [facility];  Service: Orthopedics;  Laterality: Right;  Right Total Knee Revision   Family History:  Family History  Problem Relation Age of Onset  . Esophageal cancer Father    Social History:  History   Social History  . Marital Status: Married     Spouse Name: N/A    Number of Children: 2  . Years of Education: N/A   Occupational History  . Retired [employer]   Social History Main Topics  . Smoking status: Former Smoker -- 1.00 packs/day for 20 years    Types: Cigarettes    Quit date: 06/27/1980  . Smokeless tobacco: Never Used  . Alcohol Use: No  . Drug Use: No  . Sexual Activity: Not on file   Other Topics Concern  . Not on file   Social History Narrative  . No narrative on file   ROS Constitutional: Denies weight loss/gain, headaches, insomnia, fatigue, night sweats, and change in appetite. Eyes: DEE normal Oct 2014 Denies redness, blurred vision, diplopia, discharge, itchy, watery eyes.  ENT: Denies discharge, congestion, post nasal drip, sore throat, earache, hearing loss, dental pain, Tinnitus, Vertigo, Sinus pain, snoring.  Cardio:+ edema Denies chest pain, palpitations, irregular heartbeat, dyspnea, diaphoresis, orthopnea, PND, claudication Respiratory: denies cough, dyspnea, pleurisy, hoarseness, wheezing.  Gastrointestinal: Denies dysphagia, heartburn, pain, cramps, nausea, vomiting, bloating, diarrhea, constipation, hematemesis, melena, hematochezia, hemorrhoids Genitourinary: + hesitancy and Denies dysuria, frequency, urgency, nocturia, hesitancy, discharge, hematuria, flank pain Musculoskeletal: Denies arthralgia, myalgia, stiffness, Jt. Swelling, pain, Skin: + rash and dryness around his mouth and corners for 2-3 months. And + pruritis Denies hives,  acne, eczema, changing in skin lesion Neuro: +  paresthesia, Denies Weakness, tremor, incoordination, spasms,pain Psychiatric: Denies confusion, memory loss, sensory loss Endocrine: + paresthesia,Denies change in weight, skin, hair change, nocturia, and  Diabetic Denies Polys, visual blurring, hyper /hypo glycemic episodes.  Heme/Lymph: Denies Excessive bleeding, bruising, enlarged lymph nodes  Physical Exam: Estimated body mass index is 29.23  kg/(m^2) as calculated from the following:   Height as of this encounter: 5' 6"  (1.676 m).   Weight as of this encounter: 181 lb (82.101 kg). Filed Vitals:   05/20/13 1005  BP: 128/62  Pulse: 68  Temp: 98.3 F (36.8 C)  Resp: 16   General Appearance: Well nourished, in no apparent distress. Eyes: PERRLA, EOMs, conjunctiva no swelling or erythema, normal fundi and vessels. Sinuses: No Frontal/maxillary tenderness ENT/Mouth: Ext aud canals clear, normal light reflex with TMs without erythema, bulging.  Good dentition. No erythema, swelling, or exudate on post pharynx. Tonsils not swollen or erythematous. Hearing normal.  Neck: Supple, thyroid normal. No bruits Respiratory: Respiratory effort normal, BS equal bilaterally without rales, rhonchi, wheezing or stridor. Cardio: Heart sounds normal, regular rate and rhythm without murmurs, rubs or  gallops. Peripheral pulses brisk and equal bilaterally, with 2-3+ edema bilateral legs  Chest: symmetric, with normal excursions and percussion. Breasts: Symmetric, without lumps, nipple discharge, retractions, or fibrocystic changes.  Abdomen: Flat, soft, with bowl sounds. Non tender, no guarding, rebound, hernias, masses, or organomegaly. .  Lymphatics: Non tender without lymphadenopathy.  Genitourinary: defer Musculoskeletal: His right knee is 2 x the size of his left knee, he has swelling, mild warmth. He has decrease ROM of his leg, and his gait is affected by his back and right knee.  Skin: Warm, dry without rashes, lesions, ecchymosis.  Neuro: Cranial nerves intact, reflexes equal bilaterally. Normal muscle tone, no cerebellar symptoms. Decreased sensation Psych: Awake and oriented X 3, normal affect, Insight and Judgment appropriate.   EKG: defer had in the hospital  Assessment and Plan: Hypothyroidism- check TSH  Hiatal hernia- continue to monitor  Esophageal stricture- continue to monitor  Celiac disease- discussed disease and gluten free  diet  Hypertension- continue diet/exercise  Anemia- check levels and send to Dr. Inda Merlin   Prediabetes- check A1C  Vitamin D deficiency- Check vitamin d  Arthritis- continue to see Pain managment  Vitamin B12 deficiency- check level  Edema- continue lasix and try zaroxolyn 39m QOD and follow up in one month Pruitiis- likely secondary to the pain medications- try prednisone which may help his knee/back as well- he was warned about his sugars.   CVicie Mutters10:29 AM

## 2013-05-20 NOTE — Patient Instructions (Signed)
Varicose Veins Varicose veins are veins that have become enlarged and twisted. CAUSES This condition is the result of valves in the veins not working properly. Valves in the veins help return blood from the leg to the heart. When your calf muscles squeeze, the blood moves up your leg then the valves close and this continues until the blood gets back to your heart.  If these valves are damaged, blood flows backwards and backs up into the veins in the leg near the skin OR if your are sitting/standing for a long time without using your calf muscles the blood will back up into the veins in your legs. This causes the veins to become larger. People who are on their feet a lot, sit a lot without walking (like on a plane, at a desk, or in a car), who are pregnant, or who are overweight are more likely to develop varicose veins. SYMPTOMS   Bulging, twisted-appearing, bluish veins, most commonly found on the legs.  Leg pain or a feeling of heaviness. These symptoms may be worse at the end of the day.  Leg swelling.  Skin color changes. DIAGNOSIS  Varicose veins can usually be diagnosed with an exam of your legs by your caregiver. He or she may recommend an ultrasound of your leg veins. TREATMENT  Most varicose veins can be treated at home.However, other treatments are available for people who have persistent symptoms or who want to treat the cosmetic appearance of the varicose veins. But this is only cosmetic and they will return if not properly treated. These include:  Laser treatment of very small varicose veins.  Medicine that is shot (injected) into the vein. This medicine hardens the walls of the vein and closes off the vein. This treatment is called sclerotherapy. Afterwards, you may need to wear clothing or bandages that apply pressure.  Surgery. HOME CARE INSTRUCTIONS   Do not stand or sit in one position for long periods of time. Do not sit with your legs crossed. Rest with your legs raised  during the day.  Your legs have to be higher than your heart so that gravity will force the valves to open, so please really elevate your legs.   Wear elastic stockings or support hose. Do not wear other tight, encircling garments around the legs, pelvis, or waist.  [employer]  has a wide variety of well priced compression stockings. [address], [place] [phone]  Walk as much as possible to increase blood flow.  Raise the foot of your bed at night with 2-inch blocks.  If you get a cut in the skin over the vein and the vein bleeds, lie down with your leg raised and press on it with a clean cloth until the bleeding stops. Then place a bandage (dressing) on the cut. See your caregiver if it continues to bleed or needs stitches. SEEK MEDICAL CARE IF:   The skin around your ankle starts to break down.  You have pain, redness, tenderness, or hard swelling developing in your leg over a vein.  You are uncomfortable due to leg pain. Document Released: 03/08/2005 Document Revised: 08/21/2011 Document Reviewed: 07/25/2010 [facility] Patient Information 2014 [place].

## 2013-05-21 LAB — URINALYSIS, ROUTINE W REFLEX MICROSCOPIC
Bilirubin Urine: NEGATIVE
Glucose, UA: NEGATIVE mg/dL
Hgb urine dipstick: NEGATIVE
Ketones, ur: NEGATIVE mg/dL
Protein, ur: NEGATIVE mg/dL
Urobilinogen, UA: 0.2 mg/dL (ref 0.0–1.0)

## 2013-05-21 LAB — PSA: PSA: 0.91 ng/mL (ref <=4.00)

## 2013-05-21 LAB — INSULIN, FASTING: Insulin fasting, serum: 12 u[IU]/mL (ref 3–28)

## 2013-05-21 MED ORDER — LEVOTHYROXINE SODIUM 50 MCG PO TABS: 50.0000 ug | ORAL_TABLET | Freq: Every day | ORAL | Status: DC

## 2013-05-21 NOTE — Addendum Note (Signed)
Addended by: Vicie Mutters R on: 05/21/2013 08:21 AM   Modules accepted: Orders

## 2013-06-09 ENCOUNTER — Other Ambulatory Visit: Payer: Self-pay | Admitting: Physician Assistant

## 2013-06-09 DIAGNOSIS — D649 Anemia, unspecified: Secondary | ICD-10-CM

## 2013-06-09 DIAGNOSIS — E039 Hypothyroidism, unspecified: Secondary | ICD-10-CM

## 2013-06-09 DIAGNOSIS — I10 Essential (primary) hypertension: Secondary | ICD-10-CM

## 2013-06-10 ENCOUNTER — Other Ambulatory Visit: Payer: Medicare Other

## 2013-06-10 DIAGNOSIS — I10 Essential (primary) hypertension: Secondary | ICD-10-CM

## 2013-06-10 DIAGNOSIS — D649 Anemia, unspecified: Secondary | ICD-10-CM

## 2013-06-10 DIAGNOSIS — E039 Hypothyroidism, unspecified: Secondary | ICD-10-CM

## 2013-06-10 LAB — CBC WITH DIFFERENTIAL/PLATELET
Eosinophils Absolute: 0.1 10*3/uL (ref 0.0–0.7)
Lymphocytes Relative: 26 % (ref 12–46)
Lymphs Abs: 2.1 10*3/uL (ref 0.7–4.0)
MCH: 29.3 pg (ref 26.0–34.0)
Neutrophils Relative %: 67 % (ref 43–77)
Platelets: 341 10*3/uL (ref 150–400)

## 2013-06-10 LAB — TSH: TSH: 4.218 u[IU]/mL (ref 0.350–4.500)

## 2013-06-10 LAB — BASIC METABOLIC PANEL WITH GFR
CO2: 31 mEq/L (ref 19–32)
Calcium: 9.3 mg/dL (ref 8.4–10.5)
GFR, Est Non African American: 67 mL/min
Sodium: 137 mEq/L (ref 135–145)

## 2013-07-16 ENCOUNTER — Other Ambulatory Visit: Payer: Medicare Other

## 2013-07-16 ENCOUNTER — Other Ambulatory Visit: Payer: Self-pay | Admitting: Physician Assistant

## 2013-07-16 DIAGNOSIS — E039 Hypothyroidism, unspecified: Secondary | ICD-10-CM

## 2013-07-16 LAB — TSH: TSH: 3.987 u[IU]/mL (ref 0.350–4.500)

## 2013-07-17 MED ORDER — LEVOTHYROXINE SODIUM 75 MCG PO TABS
75.0000 ug | ORAL_TABLET | Freq: Every day | ORAL | Status: DC
Start: 2013-07-17 — End: 2013-08-20

## 2013-08-01 ENCOUNTER — Encounter (HOSPITAL_COMMUNITY): Payer: Self-pay | Admitting: Emergency Medicine

## 2013-08-01 ENCOUNTER — Emergency Department (HOSPITAL_COMMUNITY)
Admission: EM | Admit: 2013-08-01 | Discharge: 2013-08-01 | Disposition: A | Discharge status: Home / Self Care | Payer: Medicare Other | Attending: Emergency Medicine | Admitting: Emergency Medicine

## 2013-08-01 DIAGNOSIS — Z87891 Personal history of nicotine dependence: Secondary | ICD-10-CM | POA: Insufficient documentation

## 2013-08-01 DIAGNOSIS — E039 Hypothyroidism, unspecified: Secondary | ICD-10-CM | POA: Insufficient documentation

## 2013-08-01 DIAGNOSIS — T381X5A Adverse effect of thyroid hormones and substitutes, initial encounter: Secondary | ICD-10-CM | POA: Insufficient documentation

## 2013-08-01 DIAGNOSIS — R209 Unspecified disturbances of skin sensation: Secondary | ICD-10-CM | POA: Insufficient documentation

## 2013-08-01 DIAGNOSIS — R221 Localized swelling, mass and lump, neck: Principal | ICD-10-CM

## 2013-08-01 DIAGNOSIS — Z8739 Personal history of other diseases of the musculoskeletal system and connective tissue: Secondary | ICD-10-CM | POA: Insufficient documentation

## 2013-08-01 DIAGNOSIS — I10 Essential (primary) hypertension: Secondary | ICD-10-CM | POA: Insufficient documentation

## 2013-08-01 DIAGNOSIS — R220 Localized swelling, mass and lump, head: Secondary | ICD-10-CM | POA: Insufficient documentation

## 2013-08-01 DIAGNOSIS — Z862 Personal history of diseases of the blood and blood-forming organs and certain disorders involving the immune mechanism: Secondary | ICD-10-CM | POA: Insufficient documentation

## 2013-08-01 DIAGNOSIS — Z79899 Other long term (current) drug therapy: Secondary | ICD-10-CM | POA: Insufficient documentation

## 2013-08-01 DIAGNOSIS — Z8719 Personal history of other diseases of the digestive system: Secondary | ICD-10-CM | POA: Insufficient documentation

## 2013-08-01 MED ORDER — DIPHENHYDRAMINE HCL 25 MG PO CAPS
25.0000 mg | ORAL_CAPSULE | Freq: Once | ORAL | Status: AC
  Administered 2013-08-01: 25 mg via ORAL
  Filled 2013-08-01: qty 1

## 2013-08-01 NOTE — ED Notes (Signed)
PT comfortable with d/c and f/u instructions. No prescriptions.

## 2013-08-01 NOTE — ED Notes (Signed)
Pt was taking 50 mcg levothyroxine and had tingling in his lips and had his dose increased to 75 mcg about a week ago. Has been having swelling to his lips but this morning it was worse. His MD didn't want him to take any benadryl just come in and be seen.

## 2013-08-01 NOTE — Discharge Instructions (Signed)
Take benadryl 25 mg every 6 hours as need.  Benadryl causes drowsiness, no driving for the next 4 hours or when taking benadryl.  Contact your doctor in the next day to discuss current medication, and whether or not they want to make any change in your thyroid medication.  Return to ER if worse, increase lip or throat swelling, trouble breathing or swallowing, other concern.       Angioedema Angioedema is a sudden swelling of tissues, often of the skin. It can occur on the face or genitals or in the abdomen or other body parts. The swelling usually develops over a short period and gets better in 24 to 48 hours. It often begins during the night and is found when the person wakes up. The person may also get red, itchy patches of skin (hives). Angioedema can be dangerous if it involves swelling of the air passages.  Depending on the cause, episodes of angioedema may only happen once, come back in unpredictable patterns, or repeat for several years and then gradually fade away.  CAUSES  Angioedema can be caused by an allergic reaction to various triggers. It can also result from nonallergic causes, including reactions to drugs, immune system disorders, viral infections, or an abnormal gene that is passed to you from your parents (hereditary). For some people with angioedema, the cause is unknown.  Some things that can trigger angioedema include:   Foods.   Medicines, such as ACE inhibitors, ARBs, nonsteroidal anti-inflammatory agents, or estrogen.   Latex.   Animal saliva.   Insect stings.   Dyes used in X-rays.   Mild injury.   Dental work.  Surgery.  Stress.   Sudden changes in temperature.   Exercise. SIGNS AND SYMPTOMS   Swelling of the skin.  Hives. If these are present, there is also intense itching.  Redness in the affected area.   Pain in the affected area.  Swollen lips or tongue.  Breathing problems. This may happen if the air passages  swell.  Wheezing. If internal organs are involved, there may be:   Nausea.   Abdominal pain.   Vomiting.   Difficulty swallowing.   Difficulty passing urine. DIAGNOSIS   Your health care provider will examine the affected area and take a medical and family history.  Various tests may be done to help determine the cause. Tests may include:  Allergy skin tests to see if the problem is an allergic reaction.   Blood tests to check for hereditary angioedema.   Tests to check for underlying diseases that could cause the condition.   A review of your medicines, including over the counter medicines, may be done. TREATMENT  Treatment will depend on the cause of the angioedema. Possible treatments include:   Removal of anything that triggered the condition (such as stopping certain medicines).   Medicines to treat symptoms or prevent attacks. Medicines given may include:   Antihistamines.   Epinephrine injection.   Steroids.   Hospitalization may be required for severe attacks. If the air passages are affected, it can be an emergency. Tubes may need to be placed to keep the airway open. HOME CARE INSTRUCTIONS   Only take over-the-counter or prescription medicines as directed by your health care provider.  If you were given medicines for emergency allergy treatment, always carry them with you.  Wear a medical bracelet as directed by your health care provider.   Avoid known triggers. SEEK MEDICAL CARE IF:   You have repeat attacks of  angioedema.   Your attacks are more frequent or more severe despite preventive measures.   You have hereditary angioedema and are considering having children. It is important to discuss the risks of passing the condition on to your children with your health care provider. SEEK IMMEDIATE MEDICAL CARE IF:   You have severe swelling of the mouth, tongue, or lips.  You have difficulty breathing.   You have difficulty  swallowing.   You faint. MAKE SURE YOU:  Understand these instructions.  Will watch your condition.  Will get help right away if you are not doing well or get worse. Document Released: 08/07/2001 Document Revised: 03/19/2013 Document Reviewed: 01/20/2013 [facility] Patient Information 2014 [place], [place].

## 2013-08-01 NOTE — ED Notes (Signed)
Dr. Steinl at bedside 

## 2013-08-01 NOTE — ED Provider Notes (Signed)
CSN: [id number]     Arrival date & time 08/01/13  1403 History   First MD Initiated Contact with Patient 08/01/13 1628     Chief Complaint  Patient presents with  . Allergic Reaction     (Consider location/radiation/quality/duration/timing/severity/associated sxs/prior Treatment) Patient is a 73 y.o. male presenting with allergic reaction. The history is provided by the patient.  Allergic Reaction Presenting symptoms: no rash   pt c/o feeling as though lips were swollen earlier today, and notes mild burning sensation to lips in past few weeks. States he thinks it is due to taking his synthroid which he takes at approximately mn each night. States he will notice lip swelling in morning that resolves through day. Pt also states he increased his synthroid dose approx 1 week ago, and feels that is what made it worse earlier today. No trouble breathing or swallowing. No rash/itching/hives. States otherwise health feels at baseline. No other recent new meds. No change in home or personal products. No change in diet or new foods. No recent febrile/viral illness.  Denies any bp med use.   Past Medical History  Diagnosis Date  . Hypothyroidism   . Shortness of breath     from oxycodone at times  . H/O hiatal hernia   . Hiatal hernia   . Esophageal stricture   . Celiac disease     diagnoses 06/24/12  . Hypertension   . Anemia   . Prediabetes   . Vitamin D deficiency   . Arthritis   . Vitamin B12 deficiency    Past Surgical History  Procedure Laterality Date  . 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 Back surgery      3 diff surgeries for vertebrea broken  . Tonsillectomy      as child  . Eye surgery      bilateral cataract surgery  . Hiatal hernia repair    . Hernia repair  1950's    bilateral groin as child  . 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 Esophageal cancer Father    History  Substance Use Topics  . Smoking status: Former Smoker -- 1.00 packs/day for 20 years    Types: Cigarettes    Quit date: 06/27/1980  . Smokeless tobacco: Never Used  . Alcohol Use: No    Review of Systems  Constitutional: Negative for fever.  HENT: Negative for sore throat.   Eyes: Negative for redness.  Respiratory: Negative for shortness of breath.   Cardiovascular: Negative for chest pain.  Gastrointestinal: Negative for abdominal pain.  Genitourinary: Negative for flank pain.  Musculoskeletal: Negative for myalgias.  Skin: Negative for rash.  Neurological: Negative for headaches.  Hematological: Does not bruise/bleed easily.  Psychiatric/Behavioral: Negative for confusion.      Allergies  Feraheme and Naprosyn  Home Medications   Current Outpatient Rx  Name  Route  Sig  Dispense  Refill  . levothyroxine (SYNTHROID, LEVOTHROID) 75 MCG tablet   Oral   Take 1 tablet (75 mcg total) by mouth daily before breakfast.   90 tablet   1   . metolazone (ZAROXOLYN) 5 MG tablet   Oral   Take 5 mg by mouth daily.         Marland Kitchen omeprazole (  PRILOSEC) 20 MG capsule   Oral   Take 1 capsule (20 mg total) by mouth daily.   90 capsule   3   . Oxycodone HCl 20 MG TABS   Oral   Take 20 mg by mouth every 4 (four) hours.          . Testosterone 10 MG/ACT (2%) GEL   Transdermal   Place 2 mg onto the skin daily.          BP 143/74  Pulse 67  Temp(Src) 98.1 F (36.7 C) (Oral)  Resp 18  Wt 175 lb (79.379 kg)  SpO2 98% Physical Exam  Nursing note and vitals reviewed. Constitutional: He is oriented to person, place, and time. He appears well-developed and well-nourished. No distress.  HENT:  Nose: Nose normal.  Mouth/Throat: Oropharynx is clear and moist.  No apparent lip or throat swelling.   Eyes: Conjunctivae are normal. Pupils are equal, round, and  reactive to light. No scleral icterus.  Neck: Neck supple. No tracheal deviation present.  Cardiovascular: Normal rate.   Pulmonary/Chest: Effort normal and breath sounds normal. No accessory muscle usage. No respiratory distress.  Musculoskeletal: Normal range of motion. He exhibits no edema and no tenderness.  Lymphadenopathy:    He has no cervical adenopathy.  Neurological: He is alert and oriented to person, place, and time.  Skin: Skin is warm and dry. No rash noted. He is not diaphoretic.  Psychiatric: He has a normal mood and affect.    ED Course  Procedures (including critical care time)   MDM  Pt states he wanted to take benadryl at home, but pcp office just told him to go to ED.  Benadryl 25 mg po.  Reviewed nursing notes and prior charts for additional history.   Currently no visible lip or throat swelling. No trouble breathing or swallowing.   ?possibility mild angioedema type rxn earlier.  Will have f/u w pcp in next day for recheck, discuss meds, possible med adjustment/alternate rx.   Pt appears stable for d/c.     Mirna Mires, MD 08/01/13 [id number]

## 2013-08-10 ENCOUNTER — Other Ambulatory Visit: Payer: Self-pay | Admitting: Internal Medicine

## 2013-08-20 ENCOUNTER — Encounter: Payer: Self-pay | Admitting: Physician Assistant

## 2013-08-20 ENCOUNTER — Ambulatory Visit (INDEPENDENT_AMBULATORY_CARE_PROVIDER_SITE_OTHER): Payer: Medicare Other | Admitting: Physician Assistant

## 2013-08-20 VITALS — BP 130/72 | HR 68 | Temp 98.2°F | Resp 16 | Ht 66.0 in | Wt 171.0 lb

## 2013-08-20 DIAGNOSIS — Z1331 Encounter for screening for depression: Secondary | ICD-10-CM

## 2013-08-20 DIAGNOSIS — E039 Hypothyroidism, unspecified: Secondary | ICD-10-CM

## 2013-08-20 DIAGNOSIS — E559 Vitamin D deficiency, unspecified: Secondary | ICD-10-CM

## 2013-08-20 DIAGNOSIS — E785 Hyperlipidemia, unspecified: Secondary | ICD-10-CM | POA: Insufficient documentation

## 2013-08-20 DIAGNOSIS — D649 Anemia, unspecified: Secondary | ICD-10-CM

## 2013-08-20 DIAGNOSIS — Z79899 Other long term (current) drug therapy: Secondary | ICD-10-CM

## 2013-08-20 DIAGNOSIS — R7303 Prediabetes: Secondary | ICD-10-CM

## 2013-08-20 DIAGNOSIS — I10 Essential (primary) hypertension: Secondary | ICD-10-CM

## 2013-08-20 DIAGNOSIS — Z0000 Encounter for general adult medical examination without abnormal findings: Secondary | ICD-10-CM

## 2013-08-20 DIAGNOSIS — E538 Deficiency of other specified B group vitamins: Secondary | ICD-10-CM

## 2013-08-20 LAB — CBC WITH DIFFERENTIAL/PLATELET
Basophils Absolute: 0 10*3/uL (ref 0.0–0.1)
Basophils Relative: 0 % (ref 0–1)
EOS ABS: 0.2 10*3/uL (ref 0.0–0.7)
EOS PCT: 3 % (ref 0–5)
HEMATOCRIT: 37.1 % — AB (ref 39.0–52.0)
Hemoglobin: 12 g/dL — ABNORMAL LOW (ref 13.0–17.0)
LYMPHS ABS: 2.2 10*3/uL (ref 0.7–4.0)
LYMPHS PCT: 29 % (ref 12–46)
MCH: 26.4 pg (ref 26.0–34.0)
MCHC: 32.3 g/dL (ref 30.0–36.0)
MCV: 81.5 fL (ref 78.0–100.0)
MONO ABS: 0.8 10*3/uL (ref 0.1–1.0)
Monocytes Relative: 10 % (ref 3–12)
Neutro Abs: 4.5 10*3/uL (ref 1.7–7.7)
Neutrophils Relative %: 58 % (ref 43–77)
Platelets: 451 10*3/uL — ABNORMAL HIGH (ref 150–400)
RBC: 4.55 MIL/uL (ref 4.22–5.81)
RDW: 17.8 % — ABNORMAL HIGH (ref 11.5–15.5)
WBC: 7.7 10*3/uL (ref 4.0–10.5)

## 2013-08-20 LAB — BASIC METABOLIC PANEL WITH GFR
BUN: 11 mg/dL (ref 6–23)
CHLORIDE: 97 meq/L (ref 96–112)
CO2: 31 meq/L (ref 19–32)
CREATININE: 1.11 mg/dL (ref 0.50–1.35)
Calcium: 9.4 mg/dL (ref 8.4–10.5)
GFR, Est African American: 76 mL/min
GFR, Est Non African American: 66 mL/min
GLUCOSE: 93 mg/dL (ref 70–99)
Potassium: 4.2 mEq/L (ref 3.5–5.3)
Sodium: 137 mEq/L (ref 135–145)

## 2013-08-20 LAB — MAGNESIUM: MAGNESIUM: 1.9 mg/dL (ref 1.5–2.5)

## 2013-08-20 LAB — HEPATIC FUNCTION PANEL
ALBUMIN: 4.2 g/dL (ref 3.5–5.2)
ALK PHOS: 76 U/L (ref 39–117)
ALT: 28 U/L (ref 0–53)
AST: 32 U/L (ref 0–37)
BILIRUBIN TOTAL: 0.6 mg/dL (ref 0.2–1.2)
Bilirubin, Direct: 0.1 mg/dL (ref 0.0–0.3)
Indirect Bilirubin: 0.5 mg/dL (ref 0.2–1.2)
TOTAL PROTEIN: 7.3 g/dL (ref 6.0–8.3)

## 2013-08-20 LAB — LIPID PANEL
CHOL/HDL RATIO: 6.2 ratio
Cholesterol: 187 mg/dL (ref 0–200)
HDL: 30 mg/dL — ABNORMAL LOW (ref >39)
LDL Cholesterol: 136 mg/dL — ABNORMAL HIGH (ref 0–99)
Triglycerides: 106 mg/dL (ref <150)
VLDL: 21 mg/dL (ref 0–40)

## 2013-08-20 LAB — HEMOGLOBIN A1C
HEMOGLOBIN A1C: 6.4 % — AB (ref <5.7)
MEAN PLASMA GLUCOSE: 137 mg/dL — AB (ref <117)

## 2013-08-20 NOTE — Patient Instructions (Signed)
Preventative Care for Adults, Male       REGULAR HEALTH EXAMS:  A routine yearly physical is a good way to check in with your primary care provider about your health and preventive screening. It is also an opportunity to share updates about your health and any concerns you have, and receive a thorough all-over exam.   Most health insurance companies pay for at least some preventative services.  Check with your health plan for specific coverages.  WHAT PREVENTATIVE SERVICES DO MEN NEED?  Adult men should have their weight and blood pressure checked regularly.   Men age 35 and older should have their cholesterol levels checked regularly.  Beginning at age 50 and continuing to age 75, men should be screened for colorectal cancer.  Certain people should may need continued testing until age 85.  Other cancer screening may include exams for testicular and prostate cancer.  Updating vaccinations is part of preventative care.  Vaccinations help protect against diseases such as the flu.  Lab tests are generally done as part of preventative care to screen for anemia and blood disorders, to screen for problems with the kidneys and liver, to screen for bladder problems, to check blood sugar, and to check your cholesterol level.  Preventative services generally include counseling about diet, exercise, avoiding tobacco, drugs, excessive alcohol consumption, and sexually transmitted infections.    GENERAL RECOMMENDATIONS FOR GOOD HEALTH:  Healthy diet:  Eat a variety of foods, including fruit, vegetables, animal or vegetable protein, such as meat, fish, chicken, and eggs, or beans, lentils, tofu, and grains, such as rice.  Drink plenty of water daily.  Decrease saturated fat in the diet, avoid lots of red meat, processed foods, sweets, fast foods, and fried foods.  Exercise:  Aerobic exercise helps maintain good heart health. At least 30-40 minutes of moderate-intensity exercise is recommended.  For example, a brisk walk that increases your heart rate and breathing. This should be done on most days of the week.   Find a type of exercise or a variety of exercises that you enjoy so that it becomes a part of your daily life.  Examples are running, walking, swimming, water aerobics, and biking.  For motivation and support, explore group exercise such as aerobic class, spin class, Zumba, Yoga,or  martial arts, etc.    Set exercise goals for yourself, such as a certain weight goal, walk or run in a race such as a 5k walk/run.  Speak to your primary care provider about exercise goals.  Disease prevention:  If you smoke or chew tobacco, find out from your caregiver how to quit. It can literally save your life, no matter how long you have been a tobacco user. If you do not use tobacco, never begin.   Maintain a healthy diet and normal weight. Increased weight leads to problems with blood pressure and diabetes.   The Body Mass Index or BMI is a way of measuring how much of your body is fat. Having a BMI above 27 increases the risk of heart disease, diabetes, hypertension, stroke and other problems related to obesity. Your caregiver can help determine your BMI and based on it develop an exercise and dietary program to help you achieve or maintain this important measurement at a healthful level.  High blood pressure causes heart and blood vessel problems.  Persistent high blood pressure should be treated with medicine if weight loss and exercise do not work.   Fat and cholesterol leaves deposits in your arteries   that can block them. This causes heart disease and vessel disease elsewhere in your body.  If your cholesterol is found to be high, or if you have heart disease or certain other medical conditions, then you may need to have your cholesterol monitored frequently and be treated with medication.   Ask if you should have a stress test if your history suggests this. A stress test is a test done on  a treadmill that looks for heart disease. This test can find disease prior to there being a problem.  Avoid drinking alcohol in excess (more than two drinks per day).  Avoid use of street drugs. Do not share needles with anyone. Ask for professional help if you need assistance or instructions on stopping the use of alcohol, cigarettes, and/or drugs.  Brush your teeth twice a day with fluoride toothpaste, and floss once a day. Good oral hygiene prevents tooth decay and gum disease. The problems can be painful, unattractive, and can cause other health problems. Visit your dentist for a routine oral and dental check up and preventive care every 6-12 months.   Look at your skin regularly.  Use a mirror to look at your back. Notify your caregivers of changes in moles, especially if there are changes in shapes, colors, a size larger than a pencil eraser, an irregular border, or development of new moles.  Safety:  Use seatbelts 100% of the time, whether driving or as a passenger.  Use safety devices such as hearing protection if you work in environments with loud noise or significant background noise.  Use safety glasses when doing any work that could send debris in to the eyes.  Use a helmet if you ride a bike or motorcycle.  Use appropriate safety gear for contact sports.  Talk to your caregiver about gun safety.  Use sunscreen with a SPF (or skin protection factor) of 15 or greater.  Lighter skinned people are at a greater risk of skin cancer. Don't forget to also wear sunglasses in order to protect your eyes from too much damaging sunlight. Damaging sunlight can accelerate cataract formation.   Practice safe sex. Use condoms. Condoms are used for birth control and to help reduce the spread of sexually transmitted infections (or STIs).  Some of the STIs are gonorrhea (the clap), chlamydia, syphilis, trichomonas, herpes, HPV (human papilloma virus) and HIV (human immunodeficiency virus) which causes AIDS.  The herpes, HIV and HPV are viral illnesses that have no cure. These can result in disability, cancer and death.   Keep carbon monoxide and smoke detectors in your home functioning at all times. Change the batteries every 6 months or use a model that plugs into the wall.   Vaccinations:  Stay up to date with your tetanus shots and other required immunizations. You should have a booster for tetanus every 10 years. Be sure to get your flu shot every year, since 5%-20% of the [place] population comes down with the flu. The flu vaccine changes each year, so being vaccinated once is not enough. Get your shot in the fall, before the flu season peaks.   Other vaccines to consider:  Pneumococcal vaccine to protect against certain types of pneumonia.  This is normally recommended for adults age 65 or older.  However, adults younger than 73 years old with certain underlying conditions such as diabetes, heart or lung disease should also receive the vaccine.  Shingles vaccine to protect against Varicella Zoster if you are older than age 60, or younger   than 73 years old with certain underlying illness.  Hepatitis A vaccine to protect against a form of infection of the liver by a virus acquired from food.  Hepatitis B vaccine to protect against a form of infection of the liver by a virus acquired from blood or body fluids, particularly if you work in health care.  If you plan to travel internationally, check with your local health department for specific vaccination recommendations.  Cancer Screening:  Most routine colon cancer screening begins at the age of 50. On a yearly basis, doctors may provide special easy to use take-home tests to check for hidden blood in the stool. Sigmoidoscopy or colonoscopy can detect the earliest forms of colon cancer and is life saving. These tests use a small camera at the end of a tube to directly examine the colon. Speak to your caregiver about this at age 50, when routine  screening begins (and is repeated every 5 years unless early forms of pre-cancerous polyps or small growths are found).   At the age of 50 men usually start screening for prostate cancer every year. Screening may begin at a younger age for those with higher risk. Those at higher risk include African-Americans or having a family history of prostate cancer. There are two types of tests for prostate cancer:   Prostate-specific antigen (PSA) testing. Recent studies raise questions about prostate cancer using PSA and you should discuss this with your caregiver.   Digital rectal exam (in which your doctor's lubricated and gloved finger feels for enlargement of the prostate through the anus).   Screening for testicular cancer.  Do a monthly exam of your testicles. Gently roll each testicle between your thumb and fingers, feeling for any abnormal lumps. The best time to do this is after a hot shower or bath when the tissues are looser. Notify your caregivers of any lumps, tenderness or changes in size or shape immediately.     

## 2013-08-20 NOTE — Progress Notes (Signed)
Subjective:  [name] Fernandez is a 73 y.o. male who presents for Medicare Annual Wellness Visit and 3 month follow up for HTN, hyperlipidemia, prediabetes, and vitamin D Def.  Date of last medicare wellness visit was is unknown.  His blood pressure has been controlled at home, today their BP is BP: 130/72 mmHg He does not workout due to back pain and right knee pain from 2nd replacement  He denies chest pain, shortness of breath, dizziness.  He is not on cholesterol medication and denies myalgias. His cholesterol is not at goal. The cholesterol last visit was:   Lab Results  Component Value Date   CHOL 162 05/20/2013   HDL 27* 05/20/2013   LDLCALC 110* 05/20/2013   TRIG 126 05/20/2013   CHOLHDL 6.0 05/20/2013   He has been working on diet and exercise for prediabetes, and denies foot ulcerations, paresthesia of the feet, polydipsia and polyuria. Last A1C in the office was:  Lab Results  Component Value Date   HGBA1C 6.4* 05/20/2013   Patient is on Vitamin D supplement.   He has edema, was on lasix 70m and take zaroxyln 51mQD, and he has compression stockings/copper socks.  He continues to have fatigue, he was unable to take the thyroid pill due to lip swelling and went to ER, had a bad experience there.  He is on sublingual B12 but does not know if it is as good as the shots, which he once had.   Lab Results  Component Value Date   TSH 3.987 07/16/2013    Names of Other Physician/Practitioners you currently use: 1. [facility] here for primary care 2. Dr. ThLindalou Hoseeye doctor, last visit 06/2013 3. *Dr. RoMelven Sartoriusdentist, last visit 04/2012 4. [facility] 5. Dr. PyHilarie Fredrickson Medication Review: Current Outpatient Prescriptions on File Prior to Visit  Medication Sig Dispense Refill  . furosemide (LASIX) 40 MG tablet TAKE 1 TABLET BY MOUTH 1-2 TIMES DAILY FOR FLUID  90 tablet  2  . metolazone (ZAROXOLYN) 5 MG tablet Take 5 mg by mouth daily.       . Marland Kitchenmeprazole (PRILOSEC) 20 MG capsule Take 1 capsule (20 mg total) by mouth daily.  90 capsule  3  . Oxycodone HCl 20 MG TABS Take 20 mg by mouth every 4 (four) hours.       . Testosterone 10 MG/ACT (2%) GEL Place 2 mg onto the skin daily.       No current facility-administered medications on file prior to visit.    Current Problems (verified) Patient Active Problem List   Diagnosis Date Noted  . Hypertension   . Anemia   . Prediabetes   . Vitamin D deficiency   . Arthritis   . Vitamin B12 deficiency   . Celiac disease 06/19/2012  . GERD (gastroesophageal reflux disease) 04/30/2012  . 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 Hypothyroidism 04/30/2012  . 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 DT 01/12/2011  . Influenza-Unspecified 04/30/2013  . Pneumococcal-Unspecified 05/20/2008    Screening Tests Health Maintenance  Topic Date Due  . Tetanus/tdap  12/[birth year]/1959  . Zostavax  12/24/2000  . Influenza Vaccine  01/10/2014  . Colonoscopy  05/27/2022  . Pneumococcal Polysaccharide Vaccine Age 761nd Over  Completed    Preventative care: Tetanus: 2012  Pneumovax: 2009  Flu vaccine: 04/2013  Zostavax: declines  Colonoscopy: 05/2012 Dr. PyHilarie Fredrickson  EGD: 05/2012   History reviewed: allergies, current medications, past family history, past medical history, past social history, past surgical history and problem list   Risk Factors: Tobacco History  Smoking status  . Former Smoker -- 1.00 packs/day for 20 years  . Types: Cigarettes  . Quit date: 06/27/1980  Smokeless tobacco  . Never Used   He does not smoke.  Patient is a former smoker. Are there smokers in your home (other than you)?  No  Alcohol Current alcohol use: none  Caffeine Current caffeine use: coffee 1-2 /day  Exercise Current exercise habits: Exercise is limited by orthopedic condition(s): back and knee.   Current exercise: none  Nutrition/Diet Current diet: in general, a "healthy" diet    Cardiac risk factors: advanced age (older than 61 for men, 26 for women), dyslipidemia, family history of premature cardiovascular disease, hypertension, male gender and sedentary lifestyle.  Depression Screen Nurse depression screen reviewed.  (Note: if answer to either of the following is "Yes", a more complete depression screening is indicated)   Q1: Over the past two weeks, have you felt down, depressed or hopeless? No  Q2: Over the past two weeks, have you felt little interest or pleasure in doing things? No  Have you lost interest or pleasure in daily life? No  Do you often feel hopeless? No  Do you cry easily over simple problems? No  Activities of Daily Living Nurse ADLs screen reviewed.  In your present state of health, do you have any difficulty performing the following activities?:  Driving? No Managing money?  No Feeding yourself? No Getting from bed to chair? No Climbing a flight of stairs? Yes Preparing food and eating?: No Bathing or showering? No Getting dressed: No Getting to the toilet? No Using the toilet:No Moving around from place to place: Yes In the past year have you fallen or had a near fall?:No   Are you sexually active?  No  Do you have more than one partner?  No  Vision Difficulties: No  Hearing Difficulties: No Do you often ask people to speak up or repeat themselves? No Do you experience ringing or noises in your ears? Yes Do you have difficulty understanding soft or whispered voices? No  Cognition  Do you feel that you have a problem with memory? No  Do you often misplace items? No  Do you feel safe at home?  Yes  Advanced directives Does patient have a [place]? Yes Does patient have a Living Will? Yes   Objective:     Vision and hearing screens reviewed.   Blood pressure 130/72, pulse 68, temperature 98.2 F (36.8 C),  resp. rate 16, height 5' 6"  (1.676 m), weight 171 lb (77.565 kg). Body mass index is 27.61 kg/(m^2).  General appearance: alert, no distress, WD/WN, male Cognitive Testing  Alert? Yes  Normal Appearance?Yes  Oriented to person? Yes  Place? Yes   Time? Yes  Recall of three objects?  Yes  Can perform simple calculations? Yes  Displays appropriate judgment?Yes  Can read the correct time from a watch face?Yes  HEENT: normocephalic, sclerae anicteric, TMs pearly, nares patent, no discharge or erythema, pharynx normal Oral cavity: MMM, no lesions Neck: supple, no lymphadenopathy, no thyromegaly, no masses Heart: RRR, normal S1, S2, no murmurs Lungs: CTA bilaterally, no wheezes, rhonchi, or rales Abdomen: +bs, soft, non tender, non distended, no masses, no hepatomegaly, no splenomegaly Musculoskeletal: nontender, no swelling, no obvious deformity Extremities: no edema, no cyanosis,  no clubbing Pulses: 2+ symmetric, upper and lower extremities, normal cap refill Neurological: alert, oriented x 3, CN2-12 intact, strength normal upper extremities and lower extremities, sensation normal throughout, DTRs 2+ throughout, no cerebellar signs, gait normal Psychiatric: normal affect, behavior normal, pleasant   Assessment:   1. Hypertension at goal With EDEMA- on several diuretics, will check BMP, potassium, Mag - CBC with Differential - BASIC METABOLIC PANEL WITH GFR - Hepatic function panel  2. Vitamin B12 deficiency On sublingual, if not at goal will go back to shots - Vitamin B12  3. Hypothyroidism Allergy to levothyroxine, will check level and pending that will discuss if want medication, he does have fatigue.  - TSH  4. Prediabetes Discussed general issues about diabetes pathophysiology and management., Educational material distributed., Suggested low cholesterol diet., Encouraged aerobic exercise., Discussed foot care., Reminded to get yearly retinal exam. Patient only predm but will  monitor closely - Hemoglobin A1c - Insulin, fasting  5. Anemia Check CBC  6. Vitamin D deficiency - Vit D  25 hydroxy (rtn osteoporosis monitoring)  7. Other and unspecified hyperlipidemia - Lipid panel  8. Encounter for long-term (current) use of other medications - Magnesium  9. Screening for depression Negative   Plan:   During the course of the visit the patient was educated and counseled about appropriate screening and preventive services including:    Screening electrocardiogram  Colorectal cancer screening  Diabetes screening  Glaucoma screening  Nutrition counseling   Shingles vaccine  Screening recommendations, referrals: Vaccinations: Tdap vaccine not done  Influenza vaccine not done Pneumococcal vaccine not done Shingles vaccine yes, will see if cheaper at pharmacy Hep B vaccine not done  Nutrition assessed and recommended  Colonoscopy yes when due Recommended yearly ophthalmology/optometry visit for glaucoma screening and checkup Recommended yearly dental visit for hygiene and checkup Advanced directives - no- has done  Conditions/risks identified: BMI: Discussed weight loss, diet, and increase physical activity.  Increase physical activity: AHA recommends 150 minutes of physical activity a week.  Medications reviewed Prediabetes is at goal, ACE/ARB therapy: No, Reason not on Ace Inhibitor/ARB therapy:  prediabetes, will continue to monitor Urinary Incontinence is not an issue: discussed non pharmacology and pharmacology options.  Fall risk: mod-high- discussed PT, home fall assessment, medications, especially since on several narcotics for pain.    Medicare Attestation I have personally reviewed: The patient's medical and social history Their use of alcohol, tobacco or illicit drugs Their current medications and supplements The patient's functional ability including ADLs,fall risks, home safety risks, cognitive, and hearing and visual  impairment Diet and physical activities Evidence for depression or mood disorders  The patient's weight, height, BMI, and visual acuity have been recorded in the chart.  I have made referrals, counseling, and provided education to the patient based on review of the above and I have provided the patient with a written personalized care plan for preventive services.     Vicie Mutters, PA-C   08/20/2013

## 2013-08-21 ENCOUNTER — Encounter: Payer: Self-pay | Admitting: Physician Assistant

## 2013-08-21 ENCOUNTER — Ambulatory Visit: Payer: Self-pay | Admitting: Physician Assistant

## 2013-08-21 LAB — INSULIN, FASTING: Insulin fasting, serum: 11 u[IU]/mL (ref 3–28)

## 2013-08-21 LAB — TSH: TSH: 4.46 u[IU]/mL (ref 0.350–4.500)

## 2013-08-21 LAB — VITAMIN D 25 HYDROXY (VIT D DEFICIENCY, FRACTURES): Vit D, 25-Hydroxy: 54 ng/mL (ref 30–89)

## 2013-08-21 LAB — VITAMIN B12: VITAMIN B 12: 1994 pg/mL — AB (ref 211–911)

## 2013-09-11 ENCOUNTER — Other Ambulatory Visit: Payer: Self-pay | Admitting: Physician Assistant

## 2013-11-21 ENCOUNTER — Ambulatory Visit: Payer: Self-pay | Admitting: Physician Assistant

## 2013-12-08 ENCOUNTER — Ambulatory Visit (INDEPENDENT_AMBULATORY_CARE_PROVIDER_SITE_OTHER): Payer: Medicare Other | Admitting: Physician Assistant

## 2013-12-08 ENCOUNTER — Encounter: Payer: Self-pay | Admitting: Physician Assistant

## 2013-12-08 ENCOUNTER — Other Ambulatory Visit: Payer: Self-pay | Admitting: Physician Assistant

## 2013-12-08 VITALS — BP 118/60 | HR 60 | Temp 99.0°F | Resp 16 | Wt 181.0 lb

## 2013-12-08 DIAGNOSIS — I10 Essential (primary) hypertension: Secondary | ICD-10-CM

## 2013-12-08 DIAGNOSIS — Z79899 Other long term (current) drug therapy: Secondary | ICD-10-CM

## 2013-12-08 DIAGNOSIS — E039 Hypothyroidism, unspecified: Secondary | ICD-10-CM

## 2013-12-08 DIAGNOSIS — R7303 Prediabetes: Secondary | ICD-10-CM

## 2013-12-08 DIAGNOSIS — E559 Vitamin D deficiency, unspecified: Secondary | ICD-10-CM

## 2013-12-08 DIAGNOSIS — E785 Hyperlipidemia, unspecified: Secondary | ICD-10-CM

## 2013-12-08 DIAGNOSIS — E538 Deficiency of other specified B group vitamins: Secondary | ICD-10-CM

## 2013-12-08 DIAGNOSIS — R7309 Other abnormal glucose: Secondary | ICD-10-CM

## 2013-12-08 LAB — CBC WITH DIFFERENTIAL/PLATELET
BASOS PCT: 1 % (ref 0–1)
Basophils Absolute: 0.1 10*3/uL (ref 0.0–0.1)
EOS ABS: 0.2 10*3/uL (ref 0.0–0.7)
Eosinophils Relative: 4 % (ref 0–5)
HEMATOCRIT: 33 % — AB (ref 39.0–52.0)
Hemoglobin: 11.1 g/dL — ABNORMAL LOW (ref 13.0–17.0)
Lymphocytes Relative: 36 % (ref 12–46)
Lymphs Abs: 2.2 10*3/uL (ref 0.7–4.0)
MCH: 28.6 pg (ref 26.0–34.0)
MCHC: 33.6 g/dL (ref 30.0–36.0)
MCV: 85.1 fL (ref 78.0–100.0)
MONO ABS: 0.7 10*3/uL (ref 0.1–1.0)
Monocytes Relative: 11 % (ref 3–12)
NEUTROS PCT: 48 % (ref 43–77)
Neutro Abs: 2.9 10*3/uL (ref 1.7–7.7)
Platelets: 401 10*3/uL — ABNORMAL HIGH (ref 150–400)
RBC: 3.88 MIL/uL — ABNORMAL LOW (ref 4.22–5.81)
RDW: 18.1 % — AB (ref 11.5–15.5)
WBC: 6 10*3/uL (ref 4.0–10.5)

## 2013-12-08 MED ORDER — ALPHA-LIPOIC ACID 200 MG PO TABS: ORAL_TABLET | ORAL | Status: DC

## 2013-12-08 MED ORDER — ROPINIROLE HCL 1 MG PO TABS: 1.0000 mg | ORAL_TABLET | Freq: Three times a day (TID) | ORAL | Status: DC

## 2013-12-08 NOTE — Patient Instructions (Addendum)
Start 1/2 of the requip 1-2 hours before bed for 3-5 days, then go up to 1 mg. You can also add 1/2-1 pill at lunch or at dinner. Please call if you have questions.   Restless Legs Syndrome Restless legs syndrome is a movement disorder. It may also be called a sensori-motor disorder.  CAUSES  No one knows what specifically causes restless legs syndrome, but it tends to run in families. It is also more common in people with low iron, in pregnancy, in people who need dialysis, and those with nerve damage (neuropathy).Some medications may make restless legs syndrome worse.Those medications include drugs to treat high blood pressure, some heart conditions, nausea, colds, allergies, and depression. SYMPTOMS Symptoms include uncomfortable sensations in the legs. These leg sensations are worse during periods of inactivity or rest. They are also worse while sitting or lying down. Individuals that have the disorder describe sensations in the legs that feel like:  Pulling.  Drawing.  Crawling.  Worming.  Boring.  Tingling.  Pins and needles.  Prickling.  Pain. The sensations are usually accompanied by an overwhelming urge to move the legs. Sudden muscle jerks may also occur. Movement provides temporary relief from the discomfort. In rare cases, the arms may also be affected. Symptoms may interfere with going to sleep (sleep onset insomnia). Restless legs syndrome may also be related to periodic limb movement disorder (PLMD). PLMD is another more common motor disorder. It also causes interrupted sleep. The symptoms from PLMD usually occur most often when you are awake. TREATMENT  Treatment for restless legs syndrome is symptomatic. This means that the symptoms are treated.   Massage and cold compresses may provide temporary relief.  Walk, stretch, or take a cold or hot bath.  Get regular exercise and a good night's sleep.  Avoid caffeine, alcohol, nicotine, and medications that can make it  worse.  Do activities that provide mental stimulation like discussions, needlework, and video games. These may be helpful if you are not able to walk or stretch. Some medications are effective in relieving the symptoms. However, many of these medications have side effects. Ask your caregiver about medications that may help your symptoms. Correcting iron deficiency may improve symptoms for some patients. Document Released: 05/19/2002 Document Revised: 08/21/2011 Document Reviewed: 08/25/2010 [facility] Patient Information 2015 [place], [place]. This information is not intended to replace advice given to you by your health care provider. Make sure you discuss any questions you have with your health care provider.  Ropinirole tablets What is this medicine?  It is also used for the treatment of Restless Legs Syndrome. This medicine may be used for other purposes; ask your health care provider or pharmacist if you have questions. COMMON BRAND NAME(S): Requip What should I tell my health care provider before I take this medicine? They need to know if you have any of these conditions: -dizzy or fainting spells -heart disease -high blood pressure -kidney disease -liver disease -low blood pressure -sleeping problems -an unusual or allergic reaction to ropinirole, other medicines, foods, dyes, or preservatives -pregnant or trying to get pregnant -breast-feeding How should I use this medicine? Take this medicine by mouth with a glass of water. Follow the directions on the prescription label. You can take it with or without food. If it upsets your stomach, take it with food. Take your doses at regular intervals. Do not take your medicine more often than directed. Do not stop taking this medicine except on your doctor's advice. Talk to your pediatrician  regarding the use of this medicine in children. Special care may be needed. Overdosage: If you think you have taken too much of this medicine contact a  poison control center or emergency room at once. NOTE: This medicine is only for you. Do not share this medicine with others. What if I miss a dose? If you miss a dose, take it as soon as you can. If it is almost time for your next dose, take only that dose. Do not take double or extra doses. What may interact with this medicine? -ciprofloxacin -male hormones, like estrogens and birth control pills -medicines for depression, anxiety, or psychotic disturbances -metoclopramide -mexiletine -norfloxacin -omeprazole This list may not describe all possible interactions. Give your health care provider a list of all the medicines, herbs, non-prescription drugs, or dietary supplements you use. Also tell them if you smoke, drink alcohol, or use illegal drugs. Some items may interact with your medicine. What should I watch for while using this medicine? Visit your doctor or health care professional for regular checks on your progress. It may be several weeks or months before you feel the full effect of this medicine. You may get drowsy or dizzy. Do not drive, use machinery, or do anything that needs mental alertness until you know how this drug affects you. Do not stand or sit up quickly, especially if you are an older patient. This reduces the risk of dizzy or fainting spells. Alcohol can increase possible dizziness. Avoid alcoholic drinks. If you find that you have sudden feelings of wanting to sleep during normal activities, like cooking, watching television, or while driving or riding in a car, you should contact your health care professional. Your mouth may get dry. Chewing sugarless gum or sucking hard candy, and drinking plenty of water may help. Contact your doctor if the problem does not go away or is severe. There have been reports of increased sexual urges or other strong urges such as gambling while taking some medicines for Parkinson's disease. If you experience any of these urges while taking  this medicine, you should report it to your health care provider as soon as possible. You should check your skin often for changes to moles and new growths while taking this medicine. Call your doctor if you notice any of these changes. What side effects may I notice from receiving this medicine? Side effects that you should report to your doctor or health care professional as soon as possible: -changes in vision -chest pain -confusion -falling asleep during normal activities like driving -fast, irregular heartbeat -feeling faint or lightheaded, falls -hallucination, loss of contact with reality -increase or decrease in blood pressure -joint or muscle pain -loss of bladder control -numbness, tingling, or prickly sensations -shortness of breath, troubled breathing, tightness in chest, or wheezing -suicidal thoughts or other mood changes -uncontrollable head, mouth, neck, arm, or leg movements -vomiting Side effects that usually do not require medical attention (report to your doctor or health care professional if they continue or are bothersome): -clumsiness, feeling unsteady, or dizziness, especially early in treatment -flushing -headache -increased sweating -nausea -tremor -yawning This list may not describe all possible side effects. Call your doctor for medical advice about side effects. You may report side effects to FDA at 1-800-FDA-1088. Where should I keep my medicine? Keep out of the reach of children. Store at room temperature between 20 and 25 degrees C (68 and 77 degrees F). Protect from light and moisture. Keep container tightly closed. Throw away any unused  medicine after the expiration date. NOTE: This sheet is a summary. It may not cover all possible information. If you have questions about this medicine, talk to your doctor, pharmacist, or health care provider.  2015, [employer]. (2008-09-15 20:56:15)     Bad carbs also include fruit juice, alcohol,  and sweet tea. These are empty calories that do not signal to your brain that you are full.   Please remember the good carbs are still carbs which convert into sugar. So please measure them out no more than 1/2-1 cup of rice, oatmeal, pasta, and beans.  Veggies are however free foods! Pile them on.   I like lean protein at every meal such as chicken, [place], pork chops, cottage cheese, etc. Just do not fry these meats and please center your meal around vegetable, the meats should be a side dish.   No all fruit is created equal. Please see the list below, the fruit at the bottom is higher in sugars than the fruit at the top

## 2013-12-08 NOTE — Progress Notes (Addendum)
Assessment and Plan:  Hypertension: Continue medication, monitor blood pressure at home. Continue DASH diet. Cholesterol: Continue diet and exercise. Check cholesterol.  Pre-diabetes-Continue diet and exercise. Check A1C Vitamin D Def- check level and continue medications.  ?RLS- will try requip, and will send labs/last two notes to Dr. Hardin Negus, pain management Edema- elevate legs TID, increase activity, increase water, decrease sodium intake.  Wear compression socks more routinely if available. Return to the office if no change with symptoms. Check labs.   Continue diet and meds as discussed. Further disposition pending results of labs.  HPI 73 y.o. male  presents for 3 month follow up with hypertension, hyperlipidemia, prediabetes and vitamin D. His blood pressure has been controlled at home, today their BP is BP: 118/60 mmHg He does not workout. He denies chest pain, shortness of breath, dizziness.  He is not on cholesterol medication and denies myalgias. His cholesterol is at goal. The cholesterol last visit was:   Lab Results  Component Value Date   CHOL 187 08/20/2013   HDL 30* 08/20/2013   LDLCALC 136* 08/20/2013   TRIG 106 08/20/2013   CHOLHDL 6.2 08/20/2013   He has been working on diet and exercise for prediabetes, and denies polydipsia and polyuria. Last A1C in the office was:  Lab Results  Component Value Date   HGBA1C 6.4* 08/20/2013   Patient is on Vitamin D supplement.   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 Hardin Negus for pain management but he has been reducing his oxycodone due to swelling. He complains of pain in his legs, worse at night and worse if air blows on it. Feels better if he is moving his legs. He has tried lyrica in the past.  He has bilateral dependent edema, he takes lasix daily and takes the metalozone PRN for swelling.   He had a recent left sided root canal and is still on Amoxicillin.     Current Medications:  Current Outpatient Prescriptions on File Prior to Visit  Medication Sig Dispense Refill  . Cholecalciferol (VITAMIN D PO) Take 5,000 Int'l Units by mouth daily.      . clotrimazole-betamethasone (LOTRISONE) cream APPLY 1 APPLICATION TOPICALLY 2 (TWO) TIMES DAILY.  15 g  2  . furosemide (LASIX) 40 MG tablet TAKE 1 TABLET BY MOUTH 1-2 TIMES DAILY FOR FLUID  90 tablet  2  . MAGNESIUM PO Take by mouth daily.      . metolazone (ZAROXOLYN) 5 MG tablet Take 5 mg by mouth daily.      Marland Kitchen MILK THISTLE PO Take 240 mg by mouth daily.      . Omega-3 Fatty Acids (OMEGA-3 FISH OIL PO) Take by mouth daily.      Marland Kitchen omeprazole (PRILOSEC) 20 MG capsule Take 1 capsule (20 mg total) by mouth daily.  90 capsule  3  . Oxycodone HCl 20 MG TABS Take 20 mg by mouth every 4 (four) hours.       . Red Yeast Rice 600 MG CAPS Take 600 mg by mouth 3 (three) times daily.      . Sennosides (SENOKOT PO) Take by mouth as needed.      . Testosterone 10 MG/ACT (2%) GEL Place 2 mg onto the skin daily.       No current facility-administered medications on file prior to visit.   Medical History:  Past Medical History  Diagnosis Date  . Hypothyroidism   . Shortness of breath     from oxycodone at times  . H/O hiatal hernia   . Hiatal hernia   . Esophageal stricture   . Celiac disease     diagnoses 06/24/12  . Hypertension   . Anemia   . Prediabetes   . Vitamin D deficiency   . Arthritis   . Vitamin B12 deficiency    Allergies:  Allergies  Allergen Reactions  . Feraheme [Ferumoxytol] Other (See Comments)    Swelling of lips and tongue, could not talk 30 minutes after the infusion.  . Naprosyn [Naproxen] Swelling    Unsure if naprosyn caused this reaction  . Levothyroxine     Lip swelling     Review of Systems: [X]  = complains of  [ ]  = denies  General: Fatigue [ ]  Fever [ ]  Chills [ ]  Weakness [ ]   Insomnia Valu.Nieves  ] Eyes: Redness [ ]  Blurred vision [ ]  Diplopia [ ]   ENT: Congestion [ ]  Sinus Pain [ ]  Post Nasal Drip [ ]  Sore Throat [ ]  Earache [ ]   Cardiac: Chest pain/pressure [ ]  SOB [ ]  Orthopnea [ ]   Palpitations [ ]   Paroxysmal nocturnal dyspnea[ ]  Claudication [ ]  Edema [ ]   Pulmonary: Cough [ ]  Wheezing[ ]   SOB [ ]   Snoring [ ]   GI: Nausea [ ]  Vomiting[ ]  Dysphagia[ ]  Heartburn[ ]  Abdominal pain Valu.Nieves ] Constipation [ ] ; Diarrhea [ ] ; BRBPR [ ]  Melena[ ]  GU: Hematuria[ ]  Dysuria [ ]  Nocturia[ ]  Urgency [ ]   Hesitancy [ ]  Discharge [ ]  Neuro: Headaches[ ]  Vertigo[ ]  Paresthesias[ ]  Spasm [ ]  Speech changes [ ]  Incoordination [ ]   Ortho: Arthritis [ ]  Joint pain [ ]  Muscle pain Valu.Nieves ] Joint swelling [ ]  Back Pain Valu.Nieves ] Skin:  Rash [ ]   Pruritis [ ]  Change in skin lesion [ ]   Psych: Depression[ ]  Anxiety[ ]  Confusion [ ]  Memory loss [ ]   Heme/Lypmh: Bleeding [ ]  Bruising [ ]  Enlarged lymph nodes [ ]   Endocrine: Visual blurring [ ]  Paresthesia [ ]  Polyuria [ ]  Polydypsea [ ]    Heat/cold intolerance [ ]  Hypoglycemia [ ]   Family history- Review and unchanged Social history- Review and unchanged Physical Exam: BP 118/60  Pulse 60  Temp(Src) 99 F (37.2 C)  Resp 16  Wt 181 lb (82.101 kg) Wt Readings from Last 3 Encounters:  12/08/13 181 lb (82.101 kg)  08/20/13 171 lb (77.565 kg)  08/01/13 175 lb (79.379 kg)   General Appearance: Well nourished, in no apparent distress. Eyes: PERRLA, EOMs, conjunctiva no swelling or erythema Sinuses: No Frontal/maxillary tenderness ENT/Mouth: Ext aud canals clear, cerumen impaction right ear with manual removal of ear wax, tolerated well, TMs without erythema, bulging. No erythema, swelling, or exudate on post pharynx.  Tonsils not swollen or erythematous. Hearing normal.  Neck: Supple, thyroid normal.  Respiratory: Respiratory effort normal, BS equal bilaterally without rales, rhonchi, wheezing or stridor.  Cardio: RRR with no MRGs. Brisk peripheral pulses with 2-3  + edema.  Abdomen: Soft, + BS.  Mild epigastric tenderness with very small ventral hernias, no guarding, rebound,masses. Lymphatics: Non tender without lymphadenopathy.  Musculoskeletal: Full ROM, 5/5 strength, normal gait.  Skin: Warm, dry without rashes, lesions, ecchymosis.  Neuro: Cranial nerves intact. Normal muscle tone, no cerebellar symptoms. Sensation intact.  Psych: Awake and oriented X 3, normal affect, Insight and Judgment appropriate.  Vicie Mutters 1:57 PM  Error: should be cosigned by Dr. Melford Aase

## 2013-12-08 NOTE — Progress Notes (Deleted)
Please find out why this needs  My signature

## 2013-12-09 ENCOUNTER — Other Ambulatory Visit: Payer: Self-pay | Admitting: Physician Assistant

## 2013-12-09 LAB — BASIC METABOLIC PANEL WITH GFR
BUN: 12 mg/dL (ref 6–23)
CO2: 29 mEq/L (ref 19–32)
Calcium: 8.7 mg/dL (ref 8.4–10.5)
Chloride: 101 mEq/L (ref 96–112)
Creat: 1.07 mg/dL (ref 0.50–1.35)
GFR, EST AFRICAN AMERICAN: 80 mL/min
GFR, EST NON AFRICAN AMERICAN: 69 mL/min
Glucose, Bld: 79 mg/dL (ref 70–99)
Potassium: 4.8 mEq/L (ref 3.5–5.3)
SODIUM: 138 meq/L (ref 135–145)

## 2013-12-09 LAB — IRON AND TIBC
%SAT: 10 % — ABNORMAL LOW (ref 20–55)
Iron: 39 ug/dL — ABNORMAL LOW (ref 42–165)
TIBC: 393 ug/dL (ref 215–435)
UIBC: 354 ug/dL (ref 125–400)

## 2013-12-09 LAB — LIPID PANEL
Cholesterol: 161 mg/dL (ref 0–200)
HDL: 24 mg/dL — ABNORMAL LOW (ref >39)
LDL Cholesterol: 116 mg/dL — ABNORMAL HIGH (ref 0–99)
TRIGLYCERIDES: 106 mg/dL (ref <150)
Total CHOL/HDL Ratio: 6.7 Ratio
VLDL: 21 mg/dL (ref 0–40)

## 2013-12-09 LAB — VITAMIN D 25 HYDROXY (VIT D DEFICIENCY, FRACTURES): Vit D, 25-Hydroxy: 74 ng/mL (ref 30–89)

## 2013-12-09 LAB — HEPATIC FUNCTION PANEL
ALBUMIN: 3.8 g/dL (ref 3.5–5.2)
ALT: 22 U/L (ref 0–53)
AST: 28 U/L (ref 0–37)
Alkaline Phosphatase: 84 U/L (ref 39–117)
Bilirubin, Direct: 0.1 mg/dL (ref 0.0–0.3)
Indirect Bilirubin: 0.4 mg/dL (ref 0.2–1.2)
Total Bilirubin: 0.5 mg/dL (ref 0.2–1.2)
Total Protein: 6.5 g/dL (ref 6.0–8.3)

## 2013-12-09 LAB — TSH: TSH: 4.787 u[IU]/mL — ABNORMAL HIGH (ref 0.350–4.500)

## 2013-12-09 LAB — HEMOGLOBIN A1C
HEMOGLOBIN A1C: 6.1 % — AB (ref <5.7)
Mean Plasma Glucose: 128 mg/dL — ABNORMAL HIGH (ref <117)

## 2013-12-09 LAB — MAGNESIUM: MAGNESIUM: 2.1 mg/dL (ref 1.5–2.5)

## 2013-12-09 LAB — FERRITIN: FERRITIN: 13 ng/mL — AB (ref 22–322)

## 2013-12-09 LAB — VITAMIN B12: Vitamin B-12: >2000 pg/mL — ABNORMAL HIGH (ref 211–911)

## 2013-12-31 ENCOUNTER — Other Ambulatory Visit: Payer: Self-pay | Admitting: Physician Assistant

## 2013-12-31 MED ORDER — TESTOSTERONE 10 MG/ACT (2%) TD GEL: TRANSDERMAL | Status: DC

## 2014-01-24 ENCOUNTER — Other Ambulatory Visit: Payer: Self-pay | Admitting: Physician Assistant

## 2014-02-23 ENCOUNTER — Other Ambulatory Visit: Payer: Self-pay | Admitting: *Deleted

## 2014-02-23 ENCOUNTER — Other Ambulatory Visit: Payer: Self-pay

## 2014-02-23 MED ORDER — FUROSEMIDE 40 MG PO TABS: 40.0000 mg | ORAL_TABLET | Freq: Every day | ORAL | Status: DC

## 2014-04-19 ENCOUNTER — Other Ambulatory Visit: Payer: Self-pay | Admitting: Physician Assistant

## 2014-05-19 ENCOUNTER — Other Ambulatory Visit: Payer: Self-pay | Admitting: Physician Assistant

## 2014-05-20 ENCOUNTER — Ambulatory Visit (INDEPENDENT_AMBULATORY_CARE_PROVIDER_SITE_OTHER): Payer: Medicare Other | Admitting: Physician Assistant

## 2014-05-20 VITALS — BP 140/82 | HR 64 | Temp 97.5°F | Resp 16 | Ht 66.0 in | Wt 179.0 lb

## 2014-05-20 DIAGNOSIS — K2100 Gastro-esophageal reflux disease with esophagitis, without bleeding: Secondary | ICD-10-CM

## 2014-05-20 DIAGNOSIS — Z125 Encounter for screening for malignant neoplasm of prostate: Secondary | ICD-10-CM

## 2014-05-20 DIAGNOSIS — E291 Testicular hypofunction: Secondary | ICD-10-CM

## 2014-05-20 DIAGNOSIS — Z23 Encounter for immunization: Secondary | ICD-10-CM

## 2014-05-20 DIAGNOSIS — D649 Anemia, unspecified: Secondary | ICD-10-CM

## 2014-05-20 DIAGNOSIS — M25579 Pain in unspecified ankle and joints of unspecified foot: Secondary | ICD-10-CM

## 2014-05-20 DIAGNOSIS — R6889 Other general symptoms and signs: Secondary | ICD-10-CM

## 2014-05-20 DIAGNOSIS — E039 Hypothyroidism, unspecified: Secondary | ICD-10-CM

## 2014-05-20 DIAGNOSIS — E559 Vitamin D deficiency, unspecified: Secondary | ICD-10-CM

## 2014-05-20 DIAGNOSIS — R7303 Prediabetes: Secondary | ICD-10-CM

## 2014-05-20 DIAGNOSIS — E538 Deficiency of other specified B group vitamins: Secondary | ICD-10-CM

## 2014-05-20 DIAGNOSIS — Z79899 Other long term (current) drug therapy: Secondary | ICD-10-CM

## 2014-05-20 DIAGNOSIS — Z0001 Encounter for general adult medical examination with abnormal findings: Secondary | ICD-10-CM

## 2014-05-20 DIAGNOSIS — M1990 Unspecified osteoarthritis, unspecified site: Secondary | ICD-10-CM

## 2014-05-20 DIAGNOSIS — K900 Celiac disease: Secondary | ICD-10-CM

## 2014-05-20 DIAGNOSIS — E785 Hyperlipidemia, unspecified: Secondary | ICD-10-CM

## 2014-05-20 DIAGNOSIS — R0602 Shortness of breath: Secondary | ICD-10-CM

## 2014-05-20 DIAGNOSIS — I10 Essential (primary) hypertension: Secondary | ICD-10-CM

## 2014-05-20 LAB — CBC WITH DIFFERENTIAL/PLATELET
BASOS PCT: 1 % (ref 0–1)
Basophils Absolute: 0.1 10*3/uL (ref 0.0–0.1)
EOS PCT: 4 % (ref 0–5)
Eosinophils Absolute: 0.3 10*3/uL (ref 0.0–0.7)
HCT: 35.9 % — ABNORMAL LOW (ref 39.0–52.0)
Hemoglobin: 12.1 g/dL — ABNORMAL LOW (ref 13.0–17.0)
Lymphocytes Relative: 35 % (ref 12–46)
Lymphs Abs: 2.4 10*3/uL (ref 0.7–4.0)
MCH: 28.8 pg (ref 26.0–34.0)
MCHC: 33.7 g/dL (ref 30.0–36.0)
MCV: 85.5 fL (ref 78.0–100.0)
MONO ABS: 0.6 10*3/uL (ref 0.1–1.0)
MONOS PCT: 9 % (ref 3–12)
MPV: 9.4 fL (ref 9.4–12.4)
NEUTROS ABS: 3.5 10*3/uL (ref 1.7–7.7)
Neutrophils Relative %: 51 % (ref 43–77)
Platelets: 435 10*3/uL — ABNORMAL HIGH (ref 150–400)
RBC: 4.2 MIL/uL — ABNORMAL LOW (ref 4.22–5.81)
RDW: 18.8 % — AB (ref 11.5–15.5)
WBC: 6.8 10*3/uL (ref 4.0–10.5)

## 2014-05-20 NOTE — Patient Instructions (Signed)
Wear compression stockings for the itching/swelling.  Will get prevnar next visit- new pneumonia shot  Can try the requip but if it does not help your legs STOP it, we can try something else if you would like and here is some info below on restless legs  Restless Legs Syndrome Restless legs syndrome is a movement disorder. It may also be called a sensorimotor disorder.  CAUSES  No one knows what specifically causes restless legs syndrome, but it tends to run in families. It is also more common in people with low iron, in pregnancy, in people who need dialysis, and those with nerve damage (neuropathy).Some medications may make restless legs syndrome worse.Those medications include drugs to treat high blood pressure, some heart conditions, nausea, colds, allergies, and depression. SYMPTOMS Symptoms include uncomfortable sensations in the legs. These leg sensations are worse during periods of inactivity or rest. They are also worse while sitting or lying down. Individuals that have the disorder describe sensations in the legs that feel like: 1. Pulling. 2. Drawing. 3. Crawling. 4. Worming. 5. Boring. 6. Tingling. 7. Pins and needles. 8. Prickling. 9. Pain. The sensations are usually accompanied by an overwhelming urge to move the legs. Sudden muscle jerks may also occur. Movement provides temporary relief from the discomfort. In rare cases, the arms may also be affected. Symptoms may interfere with going to sleep (sleep onset insomnia). Restless legs syndrome may also be related to periodic limb movement disorder (PLMD). PLMD is another more common motor disorder. It also causes interrupted sleep. The symptoms from PLMD usually occur most often when you are awake. TREATMENT  Treatment for restless legs syndrome is symptomatic. This means that the symptoms are treated.   Massage and cold compresses may provide temporary relief.  Walk, stretch, or take a cold or hot bath.  Get regular  exercise and a good night's sleep.  Avoid caffeine, alcohol, nicotine, and medications that can make it worse.  Do activities that provide mental stimulation like discussions, needlework, and video games. These may be helpful if you are not able to walk or stretch. Some medications are effective in relieving the symptoms. However, many of these medications have side effects. Ask your caregiver about medications that may help your symptoms. Correcting iron deficiency may improve symptoms for some patients. Document Released: 05/19/2002 Document Revised: 10/13/2013 Document Reviewed: 08/25/2010 [facility] Patient Information 2015 [place], [place]. This information is not intended to replace advice given to you by your health care provider. Make sure you discuss any questions you have with your health care provider.     Bad carbs also include fruit juice, alcohol, and sweet tea. These are empty calories that do not signal to your brain that you are full.   Please remember the good carbs are still carbs which convert into sugar. So please measure them out no more than 1/2-1 cup of rice, oatmeal, pasta, and beans  Veggies are however free foods! Pile them on.   Not all fruit is created equal. Please see the list below, the fruit at the bottom is higher in sugars than the fruit at the top. Please avoid all dried fruits.

## 2014-05-20 NOTE — Progress Notes (Addendum)
Complete Physical  Assessment and Plan: 1. Essential hypertension - continue medications, DASH diet, exercise and monitor at home. Call if greater than 130/80. - CBC with Differential - BASIC METABOLIC PANEL WITH GFR - Hepatic function panel - Urinalysis, Routine w reflex microscopic - Microalbumin / creatinine urine ratio - EKG 12-Lead - [place], RETROPERITNL ABD,  LTD  2. Gastroesophageal reflux disease with esophagitis Continue PPI/H2 blocker, diet discussed  3. Celiac disease Avoid gluten  4. Vitamin B12 deficiency Continue sublingual B12  5. Hypothyroidism, unspecified hypothyroidism type If still hypothyroidism, will try T3 medication, cytomel 50MCG sent in for him to start 1/2 daily.  Follow up 1 month - TSH  6. Prediabetes Discussed general issues about diabetes pathophysiology and management., Educational material distributed., Suggested low cholesterol diet., Encouraged aerobic exercise., Discussed foot care., Reminded to get yearly retinal exam. - Hemoglobin A1c - Insulin, fasting - HM DIABETES FOOT EXAM  7. Arthritis Continue Dr. Hardin Negus  8. Anemia, unspecified anemia type Check CBC  9. Vitamin D deficiency - Vit D  25 hydroxy (rtn osteoporosis monitoring)  10. Hyperlipidemia -continue medications, check lipids, decrease fatty foods, increase activity.  - Lipid panel  11. Medication management - Magnesium  12. Screening for prostate cancer - PSA  13. Shortness of breath - DG Chest 2 View; Future  14. Need for prophylactic vaccination and inoculation against influenza - Flu vaccine HIGH DOSE PF  15. Need for prevnar vaccine   16. Pain in joint, ankle and foot, unspecified laterality Continue follow up with Dr. Hardin Negus. - Uric acid  17. Hypogonadism in male - Testosterone  Discussed med's effects and SE's. Screening labs and tests as requested with regular follow-up as recommended. Will get shingles vaccine next time  HPI Patient presents  for a complete physical.    His blood pressure has been controlled at home, today their BP is BP: 140/82 mmHg He does not workout due to back pain and right knee pain. He denies chest pain, shortness of breath, dizziness.  He is not on cholesterol medication and denies myalgias. His cholesterol is not at goal. The cholesterol last visit was:   Lab Results  Component Value Date   CHOL 161 12/08/2013   HDL 24* 12/08/2013   LDLCALC 116* 12/08/2013   TRIG 106 12/08/2013   CHOLHDL 6.7 12/08/2013    He has been working on diet and exercise for prediabetes, and denies polydipsia, polyuria and visual disturbances. Last A1C in the office was:  Lab Results  Component Value Date   HGBA1C 6.1* 12/08/2013   Patient is on Vitamin D supplement.   Lab Results  Component Value Date   VD25OH 74 12/08/2013     Last PSA was: Lab Results  Component Value Date   PSA 0.91 05/20/2013   He has edema, on lasix 34m and takes zaroxyln 565m1/2 a pill QD, has compression stockings. He follows with Dr. PhHardin Negust the pain clinic, he has cut back on his pain medications from 30 to 107m times a day. He is not on thyroid medication due to tongue swelling and mouth sores.  Lab Results  Component Value Date   TSH 4.787* 12/08/2013  .  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    Current Medications:  Current Outpatient Prescriptions on File Prior to Visit  Medication Sig Dispense Refill  . Alpha-Lipoic Acid 200 MG TABS Once daily    . Cholecalciferol (VITAMIN D PO)  Take 5,000 Int'l Units by mouth daily.    . clotrimazole-betamethasone (LOTRISONE) cream APPLY 1 APPLICATION TOPICALLY 2 (TWO) TIMES DAILY. 15 g 2  . clotrimazole-betamethasone (LOTRISONE) cream APPLY 1 APPLICATION TOPICALLY 2 (TWO) TIMES DAILY. 15 g 2  . furosemide (LASIX) 40 MG tablet Take 1 tablet (40 mg total) by mouth daily. 90 tablet 0  . MAGNESIUM PO Take by mouth daily.    . metolazone  (ZAROXOLYN) 5 MG tablet TAKE 1/2 TO 1 TABLET BY MOUTH EVERY OTHER DAY AS DIRECTED 30 tablet 0  . MILK THISTLE PO Take 240 mg by mouth daily.    . Omega-3 Fatty Acids (OMEGA-3 FISH OIL PO) Take by mouth daily.    Marland Kitchen omeprazole (PRILOSEC) 20 MG capsule Take 1 capsule (20 mg total) by mouth daily. 90 capsule 3  . Oxycodone HCl 20 MG TABS Take 20 mg by mouth every 4 (four) hours.     . Red Yeast Rice 600 MG CAPS Take 600 mg by mouth 3 (three) times daily.    Marland Kitchen rOPINIRole (REQUIP) 1 MG tablet Take 1 tablet (1 mg total) by mouth 3 (three) times daily. 90 tablet 0  . Sennosides (SENOKOT PO) Take by mouth as needed.    . Testosterone 10 MG/ACT (2%) GEL Place 2 ML/mg topically each day 120 g 1   No current facility-administered medications on file prior to visit.   Health Maintenance:  Immunization History  Administered Date(s) Administered  . DT 01/12/2011  . Influenza-Unspecified 04/30/2013  . Pneumococcal-Unspecified 05/20/2008   Tetanus: 2012  Pneumovax: 2009  Prevnar 13: DUE Flu vaccine: 2015 today Zostavax: will getTODAY Colonoscopy: 05/2012 Dr. Hilarie Fredrickson  EGD: 05/2012 DEE Therman 04/03/2014  Allergies:  Allergies  Allergen Reactions  . Feraheme [Ferumoxytol] Other (See Comments)    Swelling of lips and tongue, could not talk 30 minutes after the infusion.  . Naprosyn [Naproxen] Swelling    Unsure if naprosyn caused this reaction  . Levothyroxine     Lip swelling   Medical History:  Past Medical History  Diagnosis Date  . Hypothyroidism   . Shortness of breath     from oxycodone at times  . H/O hiatal hernia   . Hiatal hernia   . Esophageal stricture   . Celiac disease     diagnoses 06/24/12  . Hypertension   . Anemia   . Prediabetes   . Vitamin D deficiency   . Arthritis   . Vitamin B12 deficiency    Surgical History:  Past Surgical History  Procedure Laterality Date  . 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 Back surgery      3 diff  surgeries for vertebrea broken  . Tonsillectomy      as child  . Eye surgery      bilateral cataract surgery  . Hiatal hernia repair    . Hernia repair  1950's    bilateral groin as child  . Total knee revision  11/20/2011    Procedure: TOTAL KNEE REVISION;  Surgeon: Mauri Pole, MD;  Location: [facility];  Service: Orthopedics;  Laterality: Right;  Right Total Knee Revision   Family History:  Family History  Problem Relation Age of Onset  . Esophageal cancer Father    Social History:   History  Substance Use Topics  . Smoking status: Former Smoker -- 1.00 packs/day for 20 years    Types: Cigarettes    Quit date: 06/27/1980  . Smokeless tobacco: Never  Used  . Alcohol Use: No    Review of Systems:  Review of Systems  Constitutional: Negative.   HENT: Positive for tinnitus. Negative for congestion, ear discharge, ear pain, hearing loss, nosebleeds and sore throat.   Eyes: Negative.   Respiratory: Positive for shortness of breath (with exertion, has had normal stress test, declines accompaniments. ). Negative for cough, hemoptysis, sputum production, wheezing and stridor.   Cardiovascular: Positive for leg swelling. Negative for chest pain, palpitations, orthopnea, claudication and PND.  Gastrointestinal: Positive for heartburn and constipation. Negative for nausea, vomiting, abdominal pain, diarrhea, blood in stool and melena.  Genitourinary: Negative for dysuria, urgency, frequency, hematuria and flank pain.  Musculoskeletal: Positive for myalgias, back pain and joint pain. Negative for falls and neck pain.  Skin: Positive for itching (bilateral legs, told from pain meds). Negative for rash.  Neurological: Positive for tingling (bilateral legs). Negative for dizziness, tremors, sensory change, speech change, focal weakness, seizures, loss of consciousness and headaches.  Endo/Heme/Allergies: Negative.   Psychiatric/Behavioral: Negative.     Physical Exam: Estimated body mass  index is 28.91 kg/(m^2) as calculated from the following:   Height as of this encounter: 5' 6"  (1.676 m).   Weight as of this encounter: 179 lb (81.194 kg). BP 140/82 mmHg  Pulse 64  Temp(Src) 97.5 F (36.4 C)  Resp 16  Ht 5' 6"  (1.676 m)  Wt 179 lb (81.194 kg)  BMI 28.91 kg/m2 General Appearance: Well nourished, in no apparent distress.  Eyes: PERRLA, EOMs, conjunctiva no swelling or erythema, normal fundi and vessels.  Sinuses: No Frontal/maxillary tenderness  ENT/Mouth: Ext aud canals clear after manual removal of cerumen from right ear, normal light reflex with TMs without erythema, bulging. Good dentition. No erythema, swelling, or exudate on post pharynx. Tonsils not swollen or erythematous. Hearing normal.  Neck: Supple, thyroid normal. No bruits  Respiratory: Respiratory effort normal, BS equal bilaterally without rales, rhonchi, wheezing or stridor.  Cardio: RRR without murmurs, rubs or gallops. Brisk peripheral pulses with 2+ edema.  Chest: symmetric, with normal excursions and percussion.  Abdomen: Soft, nontender, healing vertical scar with ventral hernia no guarding, rebound,  masses, or organomegaly. .  Lymphatics: Non tender without lymphadenopathy.  Genitourinary: defer Musculoskeletal: Full ROM all peripheral extremities,4/5 strength, and antalgic gait.  Skin: Warm, dry without rashes, lesions, ecchymosis. Right chest with birth mark Neuro: Cranial nerves intact, reflexes equal bilaterally. Normal muscle tone, no cerebellar symptoms. Sensation intact.  Psych: Awake and oriented X 3, normal affect, Insight and Judgment appropriate.   EKG: WNL no changes. AORTA SCAN: WNL  Vicie Mutters 10:21 AM [facility]

## 2014-05-21 LAB — LIPID PANEL
Cholesterol: 212 mg/dL — ABNORMAL HIGH (ref 0–200)
HDL: 38 mg/dL — ABNORMAL LOW (ref >39)
LDL CALC: 152 mg/dL — AB (ref 0–99)
Total CHOL/HDL Ratio: 5.6 Ratio
Triglycerides: 109 mg/dL (ref <150)
VLDL: 22 mg/dL (ref 0–40)

## 2014-05-21 LAB — URINALYSIS, ROUTINE W REFLEX MICROSCOPIC
Bilirubin Urine: NEGATIVE
Glucose, UA: NEGATIVE mg/dL
HGB URINE DIPSTICK: NEGATIVE
KETONES UR: NEGATIVE mg/dL
Leukocytes, UA: NEGATIVE
Nitrite: NEGATIVE
PROTEIN: NEGATIVE mg/dL
Specific Gravity, Urine: 1.008 (ref 1.005–1.030)
UROBILINOGEN UA: 0.2 mg/dL (ref 0.0–1.0)
pH: 7 (ref 5.0–8.0)

## 2014-05-21 LAB — HEMOGLOBIN A1C
Hgb A1c MFr Bld: 6.5 % — ABNORMAL HIGH (ref <5.7)
Mean Plasma Glucose: 140 mg/dL — ABNORMAL HIGH (ref <117)

## 2014-05-21 LAB — MICROALBUMIN / CREATININE URINE RATIO
CREATININE, URINE: 27.4 mg/dL
Microalb, Ur: <0.2 mg/dL (ref <2.0)

## 2014-05-21 LAB — BASIC METABOLIC PANEL WITH GFR
BUN: 16 mg/dL (ref 6–23)
CO2: 33 mEq/L — ABNORMAL HIGH (ref 19–32)
CREATININE: 0.97 mg/dL (ref 0.50–1.35)
Calcium: 10.2 mg/dL (ref 8.4–10.5)
Chloride: 97 mEq/L (ref 96–112)
GFR, EST AFRICAN AMERICAN: 89 mL/min
GFR, Est Non African American: 77 mL/min
GLUCOSE: 100 mg/dL — AB (ref 70–99)
POTASSIUM: 3.9 meq/L (ref 3.5–5.3)
Sodium: 140 mEq/L (ref 135–145)

## 2014-05-21 LAB — VITAMIN D 25 HYDROXY (VIT D DEFICIENCY, FRACTURES): Vit D, 25-Hydroxy: 51 ng/mL (ref 30–100)

## 2014-05-21 LAB — INSULIN, FASTING: INSULIN FASTING, SERUM: 4.7 u[IU]/mL (ref 2.0–19.6)

## 2014-05-21 LAB — MAGNESIUM: Magnesium: 2 mg/dL (ref 1.5–2.5)

## 2014-05-21 LAB — HEPATIC FUNCTION PANEL
ALT: 34 U/L (ref 0–53)
AST: 36 U/L (ref 0–37)
Albumin: 4.3 g/dL (ref 3.5–5.2)
Alkaline Phosphatase: 74 U/L (ref 39–117)
BILIRUBIN INDIRECT: 0.4 mg/dL (ref 0.2–1.2)
Bilirubin, Direct: 0.1 mg/dL (ref 0.0–0.3)
Total Bilirubin: 0.5 mg/dL (ref 0.2–1.2)
Total Protein: 7.9 g/dL (ref 6.0–8.3)

## 2014-05-21 LAB — PSA: PSA: 0.8 ng/mL (ref <=4.00)

## 2014-05-21 LAB — TSH: TSH: 5.345 u[IU]/mL — AB (ref 0.350–4.500)

## 2014-05-21 LAB — TESTOSTERONE: Testosterone: 236 ng/dL — ABNORMAL LOW (ref 300–890)

## 2014-05-21 LAB — URIC ACID: URIC ACID, SERUM: 7.8 mg/dL (ref 4.0–7.8)

## 2014-05-25 ENCOUNTER — Other Ambulatory Visit: Payer: Self-pay

## 2014-05-25 MED ORDER — LIOTHYRONINE SODIUM 50 MCG PO TABS: ORAL_TABLET | ORAL | Status: DC

## 2014-05-25 NOTE — Addendum Note (Signed)
Addended by: Vicie Mutters R on: 05/25/2014 12:00 PM   Modules accepted: Orders, SmartSet

## 2014-06-18 ENCOUNTER — Ambulatory Visit (INDEPENDENT_AMBULATORY_CARE_PROVIDER_SITE_OTHER): Payer: Medicare Other

## 2014-06-18 DIAGNOSIS — E039 Hypothyroidism, unspecified: Secondary | ICD-10-CM

## 2014-06-18 LAB — TSH: TSH: 0.147 u[IU]/mL — AB (ref 0.350–4.500)

## 2014-06-18 NOTE — Progress Notes (Signed)
Patient ID: [name] Fernandez, male   DOB: 04/29/1941, 74 y.o.   MRN: [id number] Patient presents for 1 month recheck TSH. Patient is taking Liothyronine 31mg 1 tablet daily. Patient states he has decreased energy. Denies his mouth breaking out.

## 2014-06-21 ENCOUNTER — Other Ambulatory Visit: Payer: Self-pay | Admitting: Physician Assistant

## 2014-06-21 ENCOUNTER — Telehealth: Payer: Self-pay | Admitting: Physician Assistant

## 2014-06-21 MED ORDER — LIOTHYRONINE SODIUM 50 MCG PO TABS: ORAL_TABLET | ORAL | Status: DC

## 2014-06-21 NOTE — Telephone Encounter (Signed)
Will make an appointment for patient to recheck TSH.

## 2014-06-22 ENCOUNTER — Other Ambulatory Visit: Payer: Self-pay | Admitting: Physician Assistant

## 2014-07-15 ENCOUNTER — Ambulatory Visit: Payer: Self-pay

## 2014-07-15 ENCOUNTER — Ambulatory Visit (INDEPENDENT_AMBULATORY_CARE_PROVIDER_SITE_OTHER): Payer: Medicare Other | Admitting: *Deleted

## 2014-07-15 DIAGNOSIS — Z23 Encounter for immunization: Secondary | ICD-10-CM

## 2014-07-28 ENCOUNTER — Other Ambulatory Visit: Payer: Self-pay | Admitting: Physician Assistant

## 2014-07-28 ENCOUNTER — Other Ambulatory Visit: Payer: Self-pay | Admitting: *Deleted

## 2014-07-28 MED ORDER — TESTOSTERONE 10 MG/ACT (2%) TD GEL: TRANSDERMAL | Status: DC

## 2014-08-06 ENCOUNTER — Encounter: Payer: Self-pay | Admitting: Internal Medicine

## 2014-08-06 ENCOUNTER — Ambulatory Visit (INDEPENDENT_AMBULATORY_CARE_PROVIDER_SITE_OTHER): Payer: Medicare Other | Admitting: Internal Medicine

## 2014-08-06 VITALS — BP 114/60 | HR 68 | Temp 98.8°F | Resp 16 | Ht 67.0 in | Wt 178.0 lb

## 2014-08-06 DIAGNOSIS — E039 Hypothyroidism, unspecified: Secondary | ICD-10-CM

## 2014-08-06 DIAGNOSIS — M5440 Lumbago with sciatica, unspecified side: Secondary | ICD-10-CM | POA: Insufficient documentation

## 2014-08-06 DIAGNOSIS — M5442 Lumbago with sciatica, left side: Secondary | ICD-10-CM

## 2014-08-06 NOTE — Patient Instructions (Signed)
   Try Lyrica 75 mg samples -   take 1 to 2 capsules at bedtime

## 2014-08-07 LAB — TSH: TSH: 1.683 u[IU]/mL (ref 0.350–4.500)

## 2014-08-09 NOTE — Progress Notes (Deleted)
Patient ID: [name] Fernandez, male   DOB: [birth year], 74 y.o.   MRN: [id number]

## 2014-08-09 NOTE — Progress Notes (Signed)
Subjective:    Patient ID: [name] Fernandez, male    DOB: [birth year], 74 y.o.   MRN: [id number]  HPI Very nice 74 yo MWM with Multiplre co-morbidities incl HTN, HLD, Hypothyroid, Vit's B12 & D def, GERD returns for recheck after thyroid med adjustment 6 weeks ago - Patient has ongoing disabling LBP and sciatica and if followed at a pain Mgmt clinic. Tennis Must does relate that he has significantly decreased his opiate dosage - Also does not recall ever being tried on Lyrica/Neurontin. Medication Sig  . Cholecalciferol (VITAMIN D PO) Take 5,000 Int'l Units by mouth daily.  . clotrimazole-betamethasone (LOTRISONE) cream APPLY 1 APPLICATION TOPICALLY 2 (TWO) TIMES DAILY.  . furosemide (LASIX) 40 MG tablet Take 1 tablet (40 mg total) by mouth daily.  Marland Kitchen liothyronine (CYTOMEL) 50 MCG tablet 1/2-1 pill daily for thyroid  . MAGNESIUM PO Take by mouth daily.  . metolazone (ZAROXOLYN) 5 MG tablet TAKE 1/2 TO 1 TABLET BY MOUTH EVERY OTHER DAY AS DIRECTED  . MILK THISTLE PO Take 240 mg by mouth daily.  . Omega-3 Fatty Acids (OMEGA-3 FISH OIL PO) Take by mouth daily.  Marland Kitchen omeprazole (PRILOSEC) 20 MG capsule Take 1 capsule (20 mg total) by mouth daily.  . Red Yeast Rice 600 MG CAPS Take 600 mg by mouth 3 (three) times daily.  Marland Kitchen rOPINIRole (REQUIP) 1 MG tablet Take 1 tablet (1 mg total) by mouth 3 (three) times daily.  . Sennosides (SENOKOT PO) Take by mouth as needed.  . Testosterone 10 MG/ACT (2%) GEL Place 2 ML/mg topically each day  . Alpha-Lipoic Acid 200 MG TABS Once daily  . clotrimazole-betamethasone (LOTRISONE) cream APPLY 1 APPLICATION TOPICALLY 2 (TWO) TIMES DAILY.  Marland Kitchen Oxycodone HCl 20 MG TABS Take 20 mg by mouth every 4 (four) hours.    Allergies  Allergen Reactions  . Feraheme [Ferumoxytol] Other (See Comments)    Swelling of lips and tongue, could not talk 30 minutes after the infusion.  . Naprosyn [Naproxen] Swelling    Unsure if naprosyn caused this reaction  . Levothyroxine     Lip swelling    Past Medical History  Diagnosis Date  . Hypothyroidism   . Shortness of breath     from oxycodone at times  . H/O hiatal hernia   . Hiatal hernia   . Esophageal stricture   . Celiac disease     diagnoses 06/24/12  . Hypertension   . Anemia   . Prediabetes   . Vitamin D deficiency   . Arthritis   . Vitamin B12 deficiency    Past Surgical History  Procedure Laterality Date  . 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 Back surgery      3 diff surgeries for vertebrea broken  . Tonsillectomy      as child  . Eye surgery      bilateral cataract surgery  . Hiatal hernia repair    . Hernia repair  1950's    bilateral groin as child  . Total knee revision  11/20/2011    Procedure: TOTAL KNEE REVISION;  Surgeon: Mauri Pole, MD;  Location: [facility];  Service: Orthopedics;  Laterality: Right;  Right Total Knee Revision   Review of Systems 10 pt review negative except as above.     Objective:   Physical Exam  BP 114/60   P 68  T 98.8 F (37.1 C)  R 16  Ht 5' 7"  ( Wt 178 lb  BMI 27.87  HEENT - Eac's patent. TM's Nl. EOM's full. PERRLA. NasoOroPharynx clear. Neck - supple. Nl Thyroid. Carotids 2+ & No bruits, nodes, JVD Chest - Clear equal BS w/o Rales, rhonchi, wheezes. Cor - Nl HS. RRR w/o sig MGR. PP 1(+). No edema. MS- FROM w/o deformities. Muscle power, tone and bulk Nl. Gait Nl. Neuro - No obvious Cr N abnormalities. Sensory, motor and Cerebellar functions appear Nl w/o focal abnormalities. Psyche - Mental status normal & appropriate.  No delusions, ideations or obvious mood abnormalities.    Assessment & Plan:   1. Left-sided low back pain with sciatica, sciatica laterality unspecified  - Sx's Lyrica to try for Chronic LBP  2. Hypothyroidism, unspecified hypothyroidism type  - TSH

## 2014-08-24 ENCOUNTER — Other Ambulatory Visit: Payer: Self-pay | Admitting: Physician Assistant

## 2014-09-11 ENCOUNTER — Telehealth: Payer: Self-pay | Admitting: *Deleted

## 2014-09-11 MED ORDER — PREGABALIN 50 MG PO CAPS: 50.0000 mg | ORAL_CAPSULE | Freq: Three times a day (TID) | ORAL | Status: DC

## 2014-09-11 NOTE — Telephone Encounter (Signed)
Patient called and left message stating he tried samples of Lyrica 75 mg and noticed positive improvement with symptoms.  However, patient is requesting a lower dose be called into [facility].  Per Dr. Melford Aase Lyrica 50 mg 1 TID #90 called into pharmacy for patient.

## 2014-11-14 ENCOUNTER — Other Ambulatory Visit: Payer: Self-pay | Admitting: Internal Medicine

## 2014-11-18 ENCOUNTER — Ambulatory Visit: Payer: Self-pay | Admitting: Physician Assistant

## 2015-02-08 ENCOUNTER — Other Ambulatory Visit: Payer: Self-pay | Admitting: Internal Medicine

## 2015-04-08 ENCOUNTER — Other Ambulatory Visit: Payer: Self-pay | Admitting: Physician Assistant

## 2015-04-08 MED ORDER — TESTOSTERONE 10 MG/ACT (2%) TD GEL: TRANSDERMAL | Status: DC

## 2015-04-08 NOTE — Progress Notes (Signed)
Rx called into [facility].

## 2015-05-07 ENCOUNTER — Other Ambulatory Visit: Payer: Self-pay | Admitting: Physician Assistant

## 2015-05-26 ENCOUNTER — Ambulatory Visit (INDEPENDENT_AMBULATORY_CARE_PROVIDER_SITE_OTHER): Payer: Medicare Other | Admitting: Internal Medicine

## 2015-05-26 ENCOUNTER — Encounter: Payer: Self-pay | Admitting: Internal Medicine

## 2015-05-26 VITALS — BP 134/80 | HR 72 | Temp 97.9°F | Resp 16 | Ht 66.25 in | Wt 179.8 lb

## 2015-05-26 DIAGNOSIS — E538 Deficiency of other specified B group vitamins: Secondary | ICD-10-CM | POA: Diagnosis not present

## 2015-05-26 DIAGNOSIS — Z1212 Encounter for screening for malignant neoplasm of rectum: Secondary | ICD-10-CM

## 2015-05-26 DIAGNOSIS — N320 Bladder-neck obstruction: Secondary | ICD-10-CM

## 2015-05-26 DIAGNOSIS — K219 Gastro-esophageal reflux disease without esophagitis: Secondary | ICD-10-CM | POA: Diagnosis not present

## 2015-05-26 DIAGNOSIS — E291 Testicular hypofunction: Secondary | ICD-10-CM

## 2015-05-26 DIAGNOSIS — Z23 Encounter for immunization: Secondary | ICD-10-CM

## 2015-05-26 DIAGNOSIS — M5136 Other intervertebral disc degeneration, lumbar region: Secondary | ICD-10-CM

## 2015-05-26 DIAGNOSIS — E785 Hyperlipidemia, unspecified: Secondary | ICD-10-CM | POA: Diagnosis not present

## 2015-05-26 DIAGNOSIS — I10 Essential (primary) hypertension: Secondary | ICD-10-CM | POA: Diagnosis not present

## 2015-05-26 DIAGNOSIS — G894 Chronic pain syndrome: Secondary | ICD-10-CM

## 2015-05-26 DIAGNOSIS — E559 Vitamin D deficiency, unspecified: Secondary | ICD-10-CM | POA: Diagnosis not present

## 2015-05-26 DIAGNOSIS — Z0001 Encounter for general adult medical examination with abnormal findings: Secondary | ICD-10-CM

## 2015-05-26 DIAGNOSIS — E039 Hypothyroidism, unspecified: Secondary | ICD-10-CM

## 2015-05-26 DIAGNOSIS — Z125 Encounter for screening for malignant neoplasm of prostate: Secondary | ICD-10-CM

## 2015-05-26 DIAGNOSIS — E119 Type 2 diabetes mellitus without complications: Secondary | ICD-10-CM | POA: Diagnosis not present

## 2015-05-26 DIAGNOSIS — Z0000 Encounter for general adult medical examination without abnormal findings: Secondary | ICD-10-CM

## 2015-05-26 DIAGNOSIS — R6889 Other general symptoms and signs: Secondary | ICD-10-CM

## 2015-05-26 DIAGNOSIS — E349 Endocrine disorder, unspecified: Secondary | ICD-10-CM

## 2015-05-26 DIAGNOSIS — Z79899 Other long term (current) drug therapy: Secondary | ICD-10-CM | POA: Diagnosis not present

## 2015-05-26 NOTE — Patient Instructions (Signed)
Recommend Adult Low Dose Aspirin or   coated  Aspirin 81 mg daily   To reduce risk of Colon Cancer 20 %,   Skin Cancer 26 % ,   Melanoma 46%   and   Pancreatic cancer 60%   ++++++++++++++++++++++++++++++++++++++++++++++++++++++  Vitamin D goal   is between 70-100.   Please make sure that you are taking your Vitamin D as directed.   It is very important as a natural anti-inflammatory   helping hair, skin, and nails, as well as reducing stroke and heart attack risk.   It helps your bones and helps with mood.  It also decreases numerous cancer risks so please take it as directed.   Low Vit D is associated with a 200-300% higher risk for CANCER   and 200-300% higher risk for HEART   ATTACK  &  STROKE.   .....................................Marland Kitchen  It is also associated with higher death rate at younger ages,   autoimmune diseases like Rheumatoid arthritis, Lupus, Multiple Sclerosis.     Also many other serious conditions, like depression, Alzheimer's  Dementia, infertility, muscle aches, fatigue, fibromyalgia - just to name a few.  ++++++++++++++++++++++++++++++++++++++++++++++++  Recommend the book "The END of DIETING" by Dr Excell Seltzer   & the book "The END of DIABETES " by Dr Excell Seltzer  At [email or web address] - get book & Audio CD's     Being diabetic has a  300% increased risk for heart attack, stroke, cancer, and alzheimer- type vascular dementia. It is very important that you work harder with diet by avoiding all foods that are white. Avoid white rice (brown & wild rice is OK), white potatoes (sweetpotatoes in moderation is OK), White bread or wheat bread or anything made out of white flour like bagels, donuts, rolls, buns, biscuits, cakes, pastries, cookies, pizza crust, and pasta (made from white flour & egg whites) - vegetarian pasta or spinach or wheat pasta is OK. Multigrain breads like Arnold's or Pepperidge [employer], or multigrain sandwich thins or flatbreads.  Diet,  exercise and weight loss can reverse and cure diabetes in the early stages.  Diet, exercise and weight loss is very important in the control and prevention of complications of diabetes which affects every system in your body, ie. Brain - dementia/stroke, eyes - glaucoma/blindness, heart - heart attack/heart failure, kidneys - dialysis, stomach - gastric paralysis, intestines - malabsorption, nerves - severe painful neuritis, circulation - gangrene & loss of a leg(s), and finally cancer and Alzheimers.    I recommend avoid fried & greasy foods,  sweets/candy, white rice (brown or wild rice or Quinoa is OK), white potatoes (sweet potatoes are OK) - anything made from white flour - bagels, doughnuts, rolls, buns, biscuits,white and wheat breads, pizza crust and traditional pasta made of white flour & egg white(vegetarian pasta or spinach or wheat pasta is OK).  Multi-grain bread is OK - like multi-grain flat bread or sandwich thins. Avoid alcohol in excess. Exercise is also important.    Eat all the vegetables you want - avoid meat, especially red meat and dairy - especially cheese.  Cheese is the most concentrated form of trans-fats which is the worst thing to clog up our arteries. Veggie cheese is OK which can be found in the fresh produce section at [employer] or [employer] or [employer]  ++++++++++++++++++++++++++++++++++++++++++++++++++ DASH Eating Plan  DASH stands for "Dietary Approaches to Stop Hypertension."   The DASH eating plan is a healthy eating plan that has been shown to reduce high  blood pressure (hypertension). Additional health benefits may include reducing the risk of type 2 diabetes mellitus, heart disease, and stroke. The DASH eating plan may also help with weight loss.  WHAT DO I NEED TO KNOW ABOUT THE DASH EATING PLAN? For the DASH eating plan, you will follow these general guidelines:  Choose foods with a percent daily value for sodium of less than 5% (as listed on the food  label).  Use salt-free seasonings or herbs instead of table salt or sea salt.  Check with your health care provider or pharmacist before using salt substitutes.  Eat lower-sodium products, often labeled as "lower sodium" or "no salt added."  Eat fresh foods.  Eat more vegetables, fruits, and low-fat dairy products.    Choose whole grains. Look for the word "whole" as the first word in the ingredient list.  Choose fish   Limit sweets, desserts, sugars, and sugary drinks.  Choose heart-healthy fats.  Eat veggie cheese   Eat more home-cooked food and less restaurant, buffet, and fast food.  Limit fried foods.  Cook foods using methods other than frying.  Limit canned vegetables. If you do use them, rinse them well to decrease the sodium.  When eating at a restaurant, ask that your food be prepared with less salt, or no salt if possible.                      WHAT FOODS CAN I EAT?  Seek help from a dietitian for individual calorie needs.  Grains Whole grain or whole wheat bread. Brown rice. Whole grain or whole wheat pasta. Quinoa, bulgur, and whole grain cereals. Low-sodium cereals. Corn or whole wheat flour tortillas. Whole grain cornbread. Whole grain crackers. Low-sodium crackers.  Vegetables Fresh or frozen vegetables (raw, steamed, roasted, or grilled). Low-sodium or reduced-sodium tomato and vegetable juices. Low-sodium or reduced-sodium tomato sauce and paste. Low-sodium or reduced-sodium canned vegetables.   Fruits All fresh, canned (in natural juice), or frozen fruits.  Protein Products  All fish and seafood.  Dried beans, peas, or lentils. Unsalted nuts and seeds. Unsalted canned beans.  Dairy Low-fat dairy products, such as skim or 1% milk, 2% or reduced-fat cheeses, low-fat ricotta or cottage cheese, or plain low-fat yogurt. Low-sodium or reduced-sodium cheeses.  Fats and Oils Tub margarines without trans fats. Light or reduced-fat mayonnaise and salad  dressings (reduced sodium). Avocado. Safflower, olive, or canola oils. Natural peanut or almond butter.  Other Unsalted popcorn and pretzels. The items listed above may not be a complete list of recommended foods or beverages. Contact your dietitian for more options.  +++++++++++++++++++++++++++++++++++++++++++  WHAT FOODS ARE NOT RECOMMENDED?  Grains/ White flour or wheat flour  White bread. White pasta. White rice. Refined cornbread. Bagels and croissants. Crackers that contain trans fat.  Vegetables  Creamed or fried vegetables. Vegetables in a . Regular canned vegetables. Regular canned tomato sauce and paste. Regular tomato and vegetable juices.  Fruits Dried fruits. Canned fruit in light or heavy syrup. Fruit juice.  Meat and Other Protein Products Meat in general. Fatty cuts of meat. Ribs, chicken wings, bacon, sausage, bologna, salami, chitterlings, fatback, hot dogs, bratwurst, and packaged luncheon meats.  Dairy Whole or 2% milk, cream, half-and-half, and cream cheese. Whole-fat or sweetened yogurt. Full-fat cheeses or blue cheese. Nondairy creamers and whipped toppings. Processed cheese, cheese spreads, or cheese curds.  Condiments Onion and garlic salt, seasoned salt, table salt, and sea salt. Canned and packaged gravies. Worcestershire sauce. Tartar sauce.  Barbecue sauce. Teriyaki sauce. Soy sauce, including reduced sodium. Steak sauce. Fish sauce. Oyster sauce. Cocktail sauce. Horseradish. Ketchup and mustard. Meat flavorings and tenderizers. Bouillon cubes. Hot sauce. Tabasco sauce. Marinades. Taco seasonings. Relishes.  Fats and Oils Butter, stick margarine, lard, shortening, ghee, and bacon fat. Coconut, palm kernel, or palm oils. Regular salad dressings.  Pickles and olives. Salted popcorn and pretzels. The items listed above may not be a complete list of foods and beverages to avoid.   Preventive Care for Adults  A healthy lifestyle and preventive care can  promote health and wellness. Preventive health guidelines for men include the following key practices:  A routine yearly physical is a good way to check with your health care provider about your health and preventative screening. It is a chance to share any concerns and updates on your health and to receive a thorough exam.  Visit your dentist for a routine exam and preventative care every 6 months. Brush your teeth twice a day and floss once a day. Good oral hygiene prevents tooth decay and gum disease.  The frequency of eye exams is based on your age, health, family medical history, use of contact lenses, and other factors. Follow your health care provider's recommendations for frequency of eye exams.  Eat a healthy diet. Foods such as vegetables, fruits, whole grains, low-fat dairy products, and lean protein foods contain the nutrients you need without too many calories. Decrease your intake of foods high in solid fats, added sugars, and salt. Eat the right amount of calories for you.Get information about a proper diet from your health care provider, if necessary.  Regular physical exercise is one of the most important things you can do for your health. Most adults should get at least 150 minutes of moderate-intensity exercise (any activity that increases your heart rate and causes you to sweat) each week. In addition, most adults need muscle-strengthening exercises on 2 or more days a week.  Maintain a healthy weight. The body mass index (BMI) is a screening tool to identify possible weight problems. It provides an estimate of body fat based on height and weight. Your health care provider can find your BMI and can help you achieve or maintain a healthy weight.For adults 20 years and older:  A BMI below 18.5 is considered underweight.  A BMI of 18.5 to 24.9 is normal.  A BMI of 25 to 29.9 is considered overweight.  A BMI of 30 and above is considered obese.  Maintain normal blood lipids  and cholesterol levels by exercising and minimizing your intake of saturated fat. Eat a balanced diet with plenty of fruit and vegetables. Blood tests for lipids and cholesterol should begin at age 44 and be repeated every 5 years. If your lipid or cholesterol levels are high, you are over 50, or you are at high risk for heart disease, you may need your cholesterol levels checked more frequently.Ongoing high lipid and cholesterol levels should be treated with medicines if diet and exercise are not working.  If you smoke, find out from your health care provider how to quit. If you do not use tobacco, do not start.  Lung cancer screening is recommended for adults aged 75-80 years who are at high risk for developing lung cancer because of a history of smoking. A yearly low-dose CT scan of the lungs is recommended for people who have at least a 30-pack-year history of smoking and are a current smoker or have quit within the past 15  years. A pack year of smoking is smoking an average of 1 pack of cigarettes a day for 1 year (for example: 1 pack a day for 30 years or 2 packs a day for 15 years). Yearly screening should continue until the smoker has stopped smoking for at least 15 years. Yearly screening should be stopped for people who develop a health problem that would prevent them from having lung cancer treatment.  If you choose to drink alcohol, do not have more than 2 drinks per day. One drink is considered to be 12 ounces (355 mL) of beer, 5 ounces (148 mL) of wine, or 1.5 ounces (44 mL) of liquor.  Avoid use of street drugs. Do not share needles with anyone. Ask for help if you need support or instructions about stopping the use of drugs.  High blood pressure causes heart disease and increases the risk of stroke. Your blood pressure should be checked at least every 1-2 years. Ongoing high blood pressure should be treated with medicines, if weight loss and exercise are not effective.  If you are 94-46  years old, ask your health care provider if you should take aspirin to prevent heart disease.  Diabetes screening involves taking a blood sample to check your fasting blood sugar level. Testing should be considered at a younger age or be carried out more frequently if you are overweight and have at least 1 risk factor for diabetes.  Colorectal cancer can be detected and often prevented. Most routine colorectal cancer screening begins at the age of 58 and continues through age 73. However, your health care provider may recommend screening at an earlier age if you have risk factors for colon cancer. On a yearly basis, your health care provider may provide home test kits to check for hidden blood in the stool. Use of a small camera at the end of a tube to directly examine the colon (sigmoidoscopy or colonoscopy) can detect the earliest forms of colorectal cancer. Talk to your health care provider about this at age 68, when routine screening begins. Direct exam of the colon should be repeated every 5-10 years through age 68, unless early forms of precancerous polyps or small growths are found.  Hepatitis C blood testing is recommended for all people born from [birth year] through 1965 and any individual with known risks for hepatitis C.  Screening for abdominal aortic aneurysm (AAA)  by ultrasound is recommended for people who have history of high blood pressure or who are current or former smokers.  Healthy men should  receive prostate-specific antigen (PSA) blood tests as part of routine cancer screening. Talk with your health care provider about prostate cancer screening.  Testicular cancer screening is  recommended for adult males. Screening includes self-exam, a health care provider exam, and other screening tests. Consult with your health care provider about any symptoms you have or any concerns you have about testicular cancer.  Use sunscreen. Apply sunscreen liberally and repeatedly throughout the day. You  should seek shade when your shadow is shorter than you. Protect yourself by wearing long sleeves, pants, a wide-brimmed hat, and sunglasses year round, whenever you are outdoors.  Once a month, do a whole-body skin exam, using a mirror to look at the skin on your back. Tell your health care provider about new moles, moles that have irregular borders, moles that are larger than a pencil eraser, or moles that have changed in shape or color.  Stay current with required vaccines (immunizations).  Influenza vaccine.  All adults should be immunized every year.  Tetanus, diphtheria, and acellular pertussis (Td, Tdap) vaccine. An adult who has not previously received Tdap or who does not know his vaccine status should receive 1 dose of Tdap. This initial dose should be followed by tetanus and diphtheria toxoids (Td) booster doses every 10 years. Adults with an unknown or incomplete history of completing a 3-dose immunization series with Td-containing vaccines should begin or complete a primary immunization series including a Tdap dose. Adults should receive a Td booster every 10 years.  Zoster vaccine. One dose is recommended for adults aged 21 years or older unless certain conditions are present.    PREVNAR - Pneumococcal 13-valent conjugate (PCV13) vaccine. When indicated, a person who is uncertain of his immunization history and has no record of immunization should receive the PCV13 vaccine. An adult aged 51 years or older who has certain medical conditions and has not been previously immunized should receive 1 dose of PCV13 vaccine. This PCV13 should be followed with a dose of pneumococcal polysaccharide (PPSV23) vaccine. The PPSV23 vaccine dose should be obtained at least 8 weeks after the dose of PCV13 vaccine. An adult aged 15 years or older who has certain medical conditions and previously received 1 or more doses of PPSV23 vaccine should receive 1 dose of PCV13. The PCV13 vaccine dose should be  obtained 1 or more years after the last PPSV23 vaccine dose.    PNEUMOVAX - Pneumococcal polysaccharide (PPSV23) vaccine. When PCV13 is also indicated, PCV13 should be obtained first. All adults aged 83 years and older should be immunized. An adult younger than age 85 years who has certain medical conditions should be immunized. Any person who resides in a nursing home or long-term care facility should be immunized. An adult smoker should be immunized. People with an immunocompromised condition and certain other conditions should receive both PCV13 and PPSV23 vaccines. People with human immunodeficiency virus (HIV) infection should be immunized as soon as possible after diagnosis. Immunization during chemotherapy or radiation therapy should be avoided. Routine use of PPSV23 vaccine is not recommended for American Indians, [place] Natives, or people younger than 65 years unless there are medical conditions that require PPSV23 vaccine. When indicated, people who have unknown immunization and have no record of immunization should receive PPSV23 vaccine. One-time revaccination 5 years after the first dose of PPSV23 is recommended for people aged 19-64 years who have chronic kidney failure, nephrotic syndrome, asplenia, or immunocompromised conditions. People who received 1-2 doses of PPSV23 before age 28 years should receive another dose of PPSV23 vaccine at age 65 years or later if at least 5 years have passed since the previous dose. Doses of PPSV23 are not needed for people immunized with PPSV23 at or after age 71 years.    Hepatitis A vaccine. Adults who wish to be protected from this disease, have certain high-risk conditions, work with hepatitis A-infected animals, work in hepatitis A research labs, or travel to or work in countries with a high rate of hepatitis A should be immunized. Adults who were previously unvaccinated and who anticipate close contact with an international adoptee during the first 60  days after arrival in the [place] from a country with a high rate of hepatitis A should be immunized.    Hepatitis B vaccine. Adults should be immunized if they wish to be protected from this disease, have certain high-risk conditions, may be exposed to blood or other infectious body fluids, are household contacts or sex partners  of hepatitis B positive people, are clients or workers in certain care facilities, or travel to or work in countries with a high rate of hepatitis B.   Preventive Service / Frequency   Ages 71 and over  Blood pressure check.  Lipid and cholesterol check.  Lung cancer screening. / Every year if you are aged 11-80 years and have a 30-pack-year history of smoking and currently smoke or have quit within the past 15 years. Yearly screening is stopped once you have quit smoking for at least 15 years or develop a health problem that would prevent you from having lung cancer treatment.  Fecal occult blood test (FOBT) of stool. You may not have to do this test if you get a colonoscopy every 10 years.  Flexible sigmoidoscopy** or colonoscopy.** / Every 5 years for a flexible sigmoidoscopy or every 10 years for a colonoscopy beginning at age 64 and continuing until age 83.  Hepatitis C blood test.** / For all people born from [birth year] through 1965 and any individual with known risks for hepatitis C.  Abdominal aortic aneurysm (AAA) screening./ Screening current or former smokers or have Hypertension.  Skin self-exam. / Monthly.  Influenza vaccine. / Every year.  Tetanus, diphtheria, and acellular pertussis (Tdap/Td) vaccine.** / 1 dose of Td every 10 years.   Zoster vaccine.** / 1 dose for adults aged 51 years or older.         Pneumococcal 13-valent conjugate (PCV13) vaccine.    Pneumococcal polysaccharide (PPSV23) vaccine.     Hepatitis A vaccine.** / Consult your health care provider.  Hepatitis B vaccine.** / Consult your health care  provider. Screening for abdominal aortic aneurysm (AAA)  by ultrasound is recommended for people who have history of high blood pressure or who are current or former smokers.

## 2015-05-26 NOTE — Progress Notes (Addendum)
Patient ID: RHYDIAN BALDI, male   DOB: Apr 17, 1941, 74 y.o.   MRN: [id number]  Medicare Annual Wellness Visit and  Comprehensive Evaluation & Examination   Assessment:   1. Essential hypertension  - Microalbumin / creatinine urine ratio - EKG 12-Lead - [place], RETROPERITNL ABD,  LTD - TSH  2. Hyperlipidemia  - Lipid panel - TSH  3. Prediabetes  - Hemoglobin A1c - Insulin, random  4. Vitamin D deficiency  - VITAMIN D 25 Hydroxy  5. Hypothyroidism   6. Gastroesophageal reflux disease   7. Celiac disease   8. Testosterone deficiency  - Testosterone  9. Vitamin B12 deficiency  - Vitamin B12  10. Medication management  - Urinalysis, Routine w reflex microscopic  - CBC with Differential/Platelet - BASIC METABOLIC PANEL WITH GFR - Hepatic function panel - Magnesium  11. DDD (degenerative disc disease), lumbar   12. Chronic pain syndrome   13. Screening for rectal cancer  - POC Hemoccult Bld/Stl   14. Prostate cancer screening  - PSA  15. Encounter for Medicare annual wellness exam   16. Encounter for general adult medical examination with abnormal findings   17. Need for prophylactic vaccination and inoculation against influenza  - Flu vaccine HIGH DOSE PF (Fluzone High dose)  Plan:   During the course of the visit the patient was educated and counseled about appropriate screening and preventive services including:    Pneumococcal vaccine   Influenza vaccine  Td vaccine  Screening electrocardiogram  Bone densitometry screening  Colorectal cancer screening  Diabetes screening  Glaucoma screening  Nutrition counseling   Advanced directives: requested  Screening recommendations, referrals: Vaccinations: Immunization History  Administered Date(s) Administered  . DT 01/12/2011  . Influenza, High Dose Seasonal PF 05/20/2014  . Influenza-Unspecified 04/30/2013  . Pneumococcal Conjugate-13 05/20/2014  . Pneumococcal-Unspecified  05/20/2008  . Zoster 07/15/2014  Hep B vaccine not indicated  Nutrition assessed and recommended  Colonoscopy 05/27/2012 Recommended yearly ophthalmology/optometry visit for glaucoma screening and checkup Recommended yearly dental visit for hygiene and checkup Advanced directives - Yes  Conditions/risks identified: BMI: Discussed weight loss, diet, and increase physical activity.  Increase physical activity: AHA recommends 150 minutes of physical activity a week.  Medications reviewed Diabetes is not at goal, ACE/ARB therapy: None Urinary Incontinence is not an issue: discussed non pharmacology and pharmacology options.  Fall risk: low- discussed PT, home fall assessment, medications.   Subjective:    DANZELL BIRKY  presents for [facility] Annual Wellness Visit and presents for a comprehensive evaluation, examination and management of multiple medical co-morbidities.  Date of last medicare wellness visit was 08/20/2013. This very nice 74 y.o. MWM presents also forfollow up with Hypertension, Hyperlipidemia, Pre-Diabetes and Vitamin D Deficiency. Other problems include chronic pain due to Lumbar DDD. Patient also has hx/o low B12 in the past. Patient has GERD which is controlled on current meds & diet.    Patient is treated for HTN since 2000 & BP has been controlled at home. Today's BP: 134/80 mmHg. Patient has had no complaints of any cardiac type chest pain, palpitations, dyspnea/orthopnea/PND, dizziness, claudication, or dependent edema.   Hyperlipidemia is not controlled with diet. Last Lipids were not at goal with  Chol 212, HDL 38, Trig 109 and elevated LDL 152.   Also, the patient has history of T2_NIDDM which he is attempting to control with diet and has had no symptoms of reactive hypoglycemia, diabetic polys, paresthesias or visual blurring.  Last A1c was 6.5% in Dec 2015.  Patient has been on thyroid replacement since 2011, but recently has stopped the Rx meds and is taking a  thyroid supplement that his wife procures at a nutrition store. In addition , patient has Low T & is on replacement. Further, the patient also has history of Vitamin D Deficiency of 26 in 2009 and supplements vitamin D without any suspected side-effects. Last vitamin D was 51 in Dec 2015.   Names of Other Physician/Practitioners you currently use: 1. [facility] here for primary care 2. Dr Corinna Capra, eye doctor, last visit 2016 & annually 3. Dr Jobe Gibbon, dentist, last visit this month and anticipating  full upper extraction with implants.   Patient Care Team: Unk Pinto, MD as PCP - General (Internal Medicine) Nicholaus Bloom, MD as Consulting Physician (Anesthesiology) Jerene Bears, MD as Consulting Physician (Gastroenterology)  Medication Review: Medication Sig  . Cholecalciferol (VITAMIN D PO) Take 5,000 Int'l Units by mouth daily.  . clotrimazole-betamethasone (LOTRISONE) cream APPLY 1 APPLICATION TOPICALLY 2 (TWO) TIMES DAILY.  . furosemide (LASIX) 40 MG tablet TAKE 1 TABLET (40 MG TOTAL) BY MOUTH DAILY.  Marland Kitchen liothyronine (CYTOMEL) 50 MCG tablet 1/2-1 pill daily for thyroid  . MAGNESIUM PO Take by mouth daily.  . metolazone (ZAROXOLYN) 5 MG tablet TAKE 1/2 TO 1 TABLET BY MOUTH EVERY OTHER DAY AS DIRECTED  . MILK THISTLE PO Take 240 mg by mouth daily.  . Omega-3 Fatty Acids (OMEGA-3 FISH OIL PO) Take by mouth daily.  Marland Kitchen omeprazole (PRILOSEC) 20 MG capsule Take 1 capsule (20 mg total) by mouth daily.  . Oxycodone HCl 10 MG TABS Take 10 mg by mouth. Takes every 4 hours  . pregabalin (LYRICA) 50 MG capsule Take 1 capsule (50 mg total) by mouth 3 (three) times daily.  . Red Yeast Rice 600 MG CAPS Take 600 mg by mouth 3 (three) times daily.  Marland Kitchen rOPINIRole (REQUIP) 1 MG tablet Take 1 tablet (1 mg total) by mouth 3 (three) times daily.  . Sennosides (SENOKOT PO) Take by mouth as needed.  . Testosterone 10 MG/ACT (2%) GEL Place 2 ML/mg topically  each day   Allergies  Allergen Reactions  . Feraheme [Ferumoxytol] Other (See Comments)    Swelling of lips and tongue, could not talk 30 minutes after the infusion.  . Naprosyn [Naproxen] Swelling    Unsure if naprosyn caused this reaction  . Levothyroxine     Lip swelling   Current Problems (verified) Patient Active Problem List   Diagnosis Date Noted  . Chronic pain syndrome 05/26/2015  . DDD (degenerative disc disease), lumbar 05/26/2015  . Medication management 05/26/2015  . 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 Lumbago with sciatica 08/06/2014  . Hyperlipidemia 08/20/2013  . Hypertension   . Prediabetes   . Vitamin D deficiency   . Arthritis   . Vitamin B12 deficiency   . Celiac disease 06/19/2012  . GERD (gastroesophageal reflux disease) 04/30/2012  . 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 Hypothyroidism 04/30/2012  . 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 INFLUENZA VACCINE  01/11/2015  . TETANUS/TDAP  01/11/2021  . COLONOSCOPY  05/27/2022  . ZOSTAVAX  Completed  . PNA vac Low Risk Adult  Completed   Immunization History  Administered Date(s) Administered  . DT 01/12/2011  . Influenza, High Dose Seasonal PF 05/20/2014, 05/26/2015  . Influenza-Unspecified 04/30/2013  . Pneumococcal Conjugate-13 05/20/2014  . Pneumococcal-Unspecified 05/20/2008  .  Zoster 07/15/2014   Preventative care: Last colonoscopy: 05/27/2012  Past Medical History  Diagnosis Date  . Hypothyroidism   . Shortness of breath     from oxycodone at times  . H/O hiatal hernia   . Hiatal hernia   . Esophageal stricture   . Celiac disease     diagnoses 06/24/12  . Hypertension   . Anemia   . Prediabetes   . Vitamin D deficiency   . Arthritis   . Vitamin B12 deficiency    Past Surgical History  Procedure Laterality Date  . 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 Back surgery       3 diff surgeries for vertebrea broken  . Tonsillectomy      as child  . Eye surgery      bilateral cataract surgery  . Hiatal hernia repair    . Hernia repair  1950's    bilateral groin as child  . Total knee revision  11/20/2011    Procedure: TOTAL KNEE REVISION;  Surgeon: Mauri Pole, MD;  Location: [facility];  Service: Orthopedics;  Laterality: Right;  Right Total Knee Revision   Risk Factors: Tobacco Social History  Substance Use Topics  . Smoking status: Former Smoker -- 1.00 packs/day for 20 years    Types: Cigarettes    Quit date: 06/27/1980  . Smokeless tobacco: Never Used  . Alcohol Use: No   He does not smoke.  Patient is a former smoker. Are there smokers in your home (other than you)?  No  Alcohol Current alcohol use: none  Caffeine Current caffeine use: coffee 2-3 cups /day  Exercise Current exercise: walks occasionally  Nutrition/Diet Current diet: in general, a "healthy" diet    Cardiac risk factors: advanced age (older than 85 for men, 39 for women), diabetes mellitus, dyslipidemia, hypertension, male gender, sedentary lifestyle and smoking/ tobacco exposure.  Depression Screen (Note: if answer to either of the following is "Yes", a more complete depression screening is indicated)   Q1: Over the past two weeks, have you felt down, depressed or hopeless? No  Q2: Over the past two weeks, have you felt little interest or pleasure in doing things? No  Have you lost interest or pleasure in daily life? No  Do you often feel hopeless? No  Do you cry easily over simple problems? No  Activities of Daily Living In your present state of health, do you have any difficulty performing the following activities?:  Driving? No Managing money?  No Feeding yourself? No Getting from bed to chair? No Climbing a flight of stairs? No Preparing food and eating?: No Bathing or showering? No Getting dressed: No Getting to the toilet? No Using the toilet:No Moving  around from place to place: No In the past year have you fallen or had a near fall?:No   Are you sexually active?  Yes  Do you have more than one partner?  No  Vision Difficulties: No  Hearing Difficulties: No Do you often ask people to speak up or repeat themselves? No Do you experience ringing or noises in your ears? No Do you have difficulty understanding soft or whispered voices? No  Cognition  Do you feel that you have a problem with memory?No  Do you often misplace items? No  Do you feel safe at home?  Yes  Advanced directives Does patient have a [place]? Yes Does patient have a Living Will? Yes  ROS: Constitutional:  Denies fever, chills, weight loss/gain, headaches, insomnia, fatigue, night sweats or change in appetite. Eyes: Denies redness, blurred vision, diplopia, discharge, itchy or watery eyes.  ENT: Denies discharge, congestion, post nasal drip, epistaxis, sore throat, earache, hearing loss, dental pain, Tinnitus, Vertigo, Sinus pain or snoring.  Cardio: Denies chest pain, palpitations, irregular heartbeat, syncope, dyspnea, diaphoresis, orthopnea, PND, claudication or edema Respiratory: denies cough, dyspnea, DOE, pleurisy, hoarseness, laryngitis or wheezing.  Gastrointestinal: Denies dysphagia, heartburn, reflux, water brash, pain, cramps, nausea, vomiting, bloating, diarrhea, constipation, hematemesis, melena, hematochezia, jaundice or hemorrhoids Genitourinary: Denies dysuria, frequency, urgency, nocturia, hesitancy, discharge, hematuria or flank pain Musculoskeletal: Has chronic LBP w/o sciatica and is followed by [facility]. Denies Falls/Fx's. Skin: Denies puritis, rash, hives, warts, acne, eczema or change in skin lesion Neuro: No weakness, tremor, incoordination, spasms, paresthesia or pain Psychiatric: Denies confusion, memory loss or sensory loss. Denies Depression. Endocrine: Denies change in weight, skin, hair change,  nocturia, and paresthesia, diabetic polys, visual blurring or hyper / hypo glycemic episodes.  Heme/Lymph: No excessive bleeding, bruising or enlarged lymph nodes.  Objective:     BP 134/80 mmHg  Pulse 72  Temp(Src) 97.9 F (36.6 C)  Resp 16  Ht 5' 6.25" (1.683 m)  Wt 179 lb 12.8 oz (81.557 kg)  BMI 28.79 kg/m2  General Appearance:  Alert  WD/WN, male  in no apparent distress. Eyes: PERRLA, EOMs nl, conjunctiva normal, normal fundi and vessels. Sinuses: No frontal/maxillary tenderness ENT/Mouth: EACs patent / TMs  nl. Nares clear without erythema, swelling, mucoid exudates. Oral hygiene is good. No erythema, swelling, or exudate. Tongue normal, non-obstructing. Tonsils not swollen or erythematous. Hearing normal.  Neck: Supple, thyroid normal. No bruits, nodes or JVD. Respiratory: Respiratory effort normal.  BS equal and clear bilateral without rales, rhonci, wheezing or stridor. Cardio: Heart sounds are normal with regular rate and rhythm and no murmurs, rubs or gallops. Peripheral pulses are normal and equal bilaterally without edema. No aortic or femoral bruits. Chest: symmetric with normal excursions and percussion.  Abdomen: Flat, soft with nl bowel sounds. Nontender, no guarding, rebound, hernias, masses, or organomegaly.  Lymphatics: Non tender without lymphadenopathy.  Genitourinary: No hernias.Testes nl. DRE - prostate nl for age - smooth & firm w/o nodules. Musculoskeletal: Full ROM all peripheral extremities, joint stability, 5/5 strength, and normal gait. Skin: Warm and dry without rashes, lesions, cyanosis, clubbing or  ecchymosis.  Neuro: Cranial nerves intact, reflexes equal bilaterally. Normal muscle tone, no cerebellar symptoms. Sensation intact to vibratory and monofilament testing to the toes bilaterally.  Pysch: Alert and oriented X 3 with normal affect, insight and judgment appropriate.   Cognitive Testing  Alert? Yes  Normal Appearance? Yes  Oriented to person?  Yes  Place? Yes   Time? Yes  Recall of three objects?  Yes  Can perform simple calculations? Yes  Displays appropriate judgment? Yes  Can read the correct time from a watch/clock? Yes  Medicare Attestation I have personally reviewed: The patient's medical and social history Their use of alcohol, tobacco or illicit drugs Their current medications and supplements The patient's functional ability including ADLs,fall risks, home safety risks, cognitive, and hearing and visual impairment Diet and physical activities Evidence for depression or mood disorders  The patient's weight, height, BMI, and visual acuity have been recorded in the chart.  I have made referrals, counseling, and provided education to the patient based on review of the above and I have provided the patient with a written personalized care  plan for preventive services.  Over 40 minutes of exam, counseling, chart review was performed.  Jawana Reagor DAVID, MD   05/26/2015

## 2015-05-27 LAB — HEPATIC FUNCTION PANEL
ALT: 48 U/L — ABNORMAL HIGH (ref 9–46)
AST: 46 U/L — ABNORMAL HIGH (ref 10–35)
Albumin: 4.2 g/dL (ref 3.6–5.1)
Alkaline Phosphatase: 60 U/L (ref 40–115)
Bilirubin, Direct: 0.2 mg/dL
Indirect Bilirubin: 0.7 mg/dL (ref 0.2–1.2)
Total Bilirubin: 0.9 mg/dL (ref 0.2–1.2)
Total Protein: 7.4 g/dL (ref 6.1–8.1)

## 2015-05-27 LAB — TSH: TSH: 5.62 u[IU]/mL — ABNORMAL HIGH (ref 0.350–4.500)

## 2015-05-27 LAB — URINALYSIS, ROUTINE W REFLEX MICROSCOPIC
Bilirubin Urine: NEGATIVE
Glucose, UA: NEGATIVE
Hgb urine dipstick: NEGATIVE
Ketones, ur: NEGATIVE
Leukocytes, UA: NEGATIVE
Nitrite: NEGATIVE
Protein, ur: NEGATIVE
Specific Gravity, Urine: 1.008 (ref 1.001–1.035)
pH: 7.5 (ref 5.0–8.0)

## 2015-05-27 LAB — CBC WITH DIFFERENTIAL/PLATELET
Basophils Absolute: 0 K/uL (ref 0.0–0.1)
Basophils Relative: 0 % (ref 0–1)
Eosinophils Absolute: 0.2 K/uL (ref 0.0–0.7)
Eosinophils Relative: 3 % (ref 0–5)
HCT: 36.6 % — ABNORMAL LOW (ref 39.0–52.0)
Hemoglobin: 11.4 g/dL — ABNORMAL LOW (ref 13.0–17.0)
Lymphocytes Relative: 40 % (ref 12–46)
Lymphs Abs: 3.1 K/uL (ref 0.7–4.0)
MCH: 26.6 pg (ref 26.0–34.0)
MCHC: 31.1 g/dL (ref 30.0–36.0)
MCV: 85.5 fL (ref 78.0–100.0)
MPV: 9.1 fL (ref 8.6–12.4)
Monocytes Absolute: 0.7 K/uL (ref 0.1–1.0)
Monocytes Relative: 9 % (ref 3–12)
Neutro Abs: 3.7 K/uL (ref 1.7–7.7)
Neutrophils Relative %: 48 % (ref 43–77)
Platelets: 440 K/uL — ABNORMAL HIGH (ref 150–400)
RBC: 4.28 MIL/uL (ref 4.22–5.81)
RDW: 16.7 % — ABNORMAL HIGH (ref 11.5–15.5)
WBC: 7.8 K/uL (ref 4.0–10.5)

## 2015-05-27 LAB — BASIC METABOLIC PANEL WITHOUT GFR
BUN: 15 mg/dL (ref 7–25)
CO2: 30 mmol/L (ref 20–31)
Calcium: 9.8 mg/dL (ref 8.6–10.3)
Chloride: 97 mmol/L — ABNORMAL LOW (ref 98–110)
Creat: 1.09 mg/dL (ref 0.70–1.18)
GFR, Est African American: 77 mL/min
GFR, Est Non African American: 67 mL/min
Glucose, Bld: 76 mg/dL (ref 65–99)
Potassium: 3.4 mmol/L — ABNORMAL LOW (ref 3.5–5.3)
Sodium: 139 mmol/L (ref 135–146)

## 2015-05-27 LAB — MAGNESIUM: Magnesium: 2 mg/dL (ref 1.5–2.5)

## 2015-05-27 LAB — MICROALBUMIN / CREATININE URINE RATIO
Creatinine, Urine: 28 mg/dL (ref 20–370)
Microalb, Ur: <0.2 mg/dL

## 2015-05-27 LAB — LIPID PANEL
Cholesterol: 185 mg/dL (ref 125–200)
HDL: 35 mg/dL — AB (ref >=40)
LDL CALC: 132 mg/dL — AB (ref <130)
TRIGLYCERIDES: 92 mg/dL (ref <150)
Total CHOL/HDL Ratio: 5.3 Ratio — ABNORMAL HIGH (ref <=5.0)
VLDL: 18 mg/dL (ref <30)

## 2015-05-27 LAB — VITAMIN D 25 HYDROXY (VIT D DEFICIENCY, FRACTURES): Vit D, 25-Hydroxy: 74 ng/mL (ref 30–100)

## 2015-05-27 LAB — INSULIN, RANDOM: Insulin: 5.8 u[IU]/mL (ref 2.0–19.6)

## 2015-05-27 LAB — HEMOGLOBIN A1C
Hgb A1c MFr Bld: 6.4 % — ABNORMAL HIGH
Mean Plasma Glucose: 137 mg/dL — ABNORMAL HIGH

## 2015-05-27 LAB — VITAMIN B12: Vitamin B-12: >2000 pg/mL — ABNORMAL HIGH (ref 211–911)

## 2015-05-27 LAB — PSA: PSA: 0.02 ng/mL

## 2015-05-27 LAB — TESTOSTERONE: Testosterone: 375 ng/dL (ref 300–890)

## 2015-07-06 DIAGNOSIS — H26053 Posterior subcapsular polar infantile and juvenile cataract, bilateral: Secondary | ICD-10-CM | POA: Diagnosis not present

## 2015-07-06 DIAGNOSIS — H35363 Drusen (degenerative) of macula, bilateral: Secondary | ICD-10-CM | POA: Diagnosis not present

## 2015-07-06 DIAGNOSIS — H524 Presbyopia: Secondary | ICD-10-CM | POA: Diagnosis not present

## 2015-07-06 DIAGNOSIS — H40023 Open angle with borderline findings, high risk, bilateral: Secondary | ICD-10-CM | POA: Diagnosis not present

## 2015-07-16 DIAGNOSIS — M546 Pain in thoracic spine: Secondary | ICD-10-CM | POA: Diagnosis not present

## 2015-07-16 DIAGNOSIS — G894 Chronic pain syndrome: Secondary | ICD-10-CM | POA: Diagnosis not present

## 2015-07-16 DIAGNOSIS — M40204 Unspecified kyphosis, thoracic region: Secondary | ICD-10-CM | POA: Diagnosis not present

## 2015-07-16 DIAGNOSIS — Z79891 Long term (current) use of opiate analgesic: Secondary | ICD-10-CM | POA: Diagnosis not present

## 2015-07-28 ENCOUNTER — Other Ambulatory Visit: Payer: Self-pay | Admitting: Internal Medicine

## 2015-07-28 ENCOUNTER — Other Ambulatory Visit: Payer: Self-pay | Admitting: Physician Assistant

## 2015-08-04 ENCOUNTER — Other Ambulatory Visit: Payer: Self-pay | Admitting: Internal Medicine

## 2015-08-26 ENCOUNTER — Other Ambulatory Visit: Payer: Self-pay | Admitting: Internal Medicine

## 2015-08-26 ENCOUNTER — Ambulatory Visit: Payer: Self-pay | Admitting: Internal Medicine

## 2015-09-10 DIAGNOSIS — G894 Chronic pain syndrome: Secondary | ICD-10-CM | POA: Diagnosis not present

## 2015-09-10 DIAGNOSIS — M546 Pain in thoracic spine: Secondary | ICD-10-CM | POA: Diagnosis not present

## 2015-09-10 DIAGNOSIS — Z79891 Long term (current) use of opiate analgesic: Secondary | ICD-10-CM | POA: Diagnosis not present

## 2015-09-10 DIAGNOSIS — M40204 Unspecified kyphosis, thoracic region: Secondary | ICD-10-CM | POA: Diagnosis not present

## 2015-09-21 ENCOUNTER — Other Ambulatory Visit: Payer: Self-pay | Admitting: Physician Assistant

## 2015-09-30 ENCOUNTER — Other Ambulatory Visit: Payer: Self-pay | Admitting: Physician Assistant

## 2015-10-31 ENCOUNTER — Other Ambulatory Visit: Payer: Self-pay | Admitting: Internal Medicine

## 2015-11-05 DIAGNOSIS — M40204 Unspecified kyphosis, thoracic region: Secondary | ICD-10-CM | POA: Diagnosis not present

## 2015-11-05 DIAGNOSIS — Z79891 Long term (current) use of opiate analgesic: Secondary | ICD-10-CM | POA: Diagnosis not present

## 2015-11-05 DIAGNOSIS — G894 Chronic pain syndrome: Secondary | ICD-10-CM | POA: Diagnosis not present

## 2015-11-05 DIAGNOSIS — M546 Pain in thoracic spine: Secondary | ICD-10-CM | POA: Diagnosis not present

## 2015-12-08 ENCOUNTER — Ambulatory Visit (INDEPENDENT_AMBULATORY_CARE_PROVIDER_SITE_OTHER): Payer: Medicare Other | Admitting: Physician Assistant

## 2015-12-08 ENCOUNTER — Ambulatory Visit: Payer: Self-pay | Admitting: Internal Medicine

## 2015-12-08 ENCOUNTER — Encounter: Payer: Self-pay | Admitting: Physician Assistant

## 2015-12-08 VITALS — BP 130/70 | HR 81 | Temp 97.9°F | Resp 14 | Ht 66.25 in | Wt 178.0 lb

## 2015-12-08 DIAGNOSIS — Z79899 Other long term (current) drug therapy: Secondary | ICD-10-CM | POA: Diagnosis not present

## 2015-12-08 DIAGNOSIS — E559 Vitamin D deficiency, unspecified: Secondary | ICD-10-CM | POA: Diagnosis not present

## 2015-12-08 DIAGNOSIS — E039 Hypothyroidism, unspecified: Secondary | ICD-10-CM | POA: Diagnosis not present

## 2015-12-08 DIAGNOSIS — R7309 Other abnormal glucose: Secondary | ICD-10-CM

## 2015-12-08 DIAGNOSIS — E785 Hyperlipidemia, unspecified: Secondary | ICD-10-CM | POA: Diagnosis not present

## 2015-12-08 DIAGNOSIS — N182 Chronic kidney disease, stage 2 (mild): Secondary | ICD-10-CM | POA: Insufficient documentation

## 2015-12-08 DIAGNOSIS — J449 Chronic obstructive pulmonary disease, unspecified: Secondary | ICD-10-CM | POA: Insufficient documentation

## 2015-12-08 DIAGNOSIS — I10 Essential (primary) hypertension: Secondary | ICD-10-CM

## 2015-12-08 DIAGNOSIS — G894 Chronic pain syndrome: Secondary | ICD-10-CM | POA: Diagnosis not present

## 2015-12-08 LAB — CBC WITH DIFFERENTIAL/PLATELET
Basophils Absolute: 66 cells/uL (ref 0–200)
Basophils Relative: 1 %
EOS PCT: 5 %
Eosinophils Absolute: 330 cells/uL (ref 15–500)
HEMATOCRIT: 34.4 % — AB (ref 38.5–50.0)
HEMOGLOBIN: 11.2 g/dL — AB (ref 13.2–17.1)
LYMPHS ABS: 2376 {cells}/uL (ref 850–3900)
Lymphocytes Relative: 36 %
MCH: 26.7 pg — ABNORMAL LOW (ref 27.0–33.0)
MCHC: 32.6 g/dL (ref 32.0–36.0)
MCV: 81.9 fL (ref 80.0–100.0)
MONO ABS: 462 {cells}/uL (ref 200–950)
MPV: 9.4 fL (ref 7.5–12.5)
Monocytes Relative: 7 %
NEUTROS ABS: 3366 {cells}/uL (ref 1500–7800)
Neutrophils Relative %: 51 %
Platelets: 440 10*3/uL — ABNORMAL HIGH (ref 140–400)
RBC: 4.2 MIL/uL (ref 4.20–5.80)
RDW: 18.1 % — ABNORMAL HIGH (ref 11.0–15.0)
WBC: 6.6 10*3/uL (ref 3.8–10.8)

## 2015-12-08 LAB — BASIC METABOLIC PANEL WITH GFR
BUN: 16 mg/dL (ref 7–25)
CO2: 30 mmol/L (ref 20–31)
Calcium: 9.5 mg/dL (ref 8.6–10.3)
Chloride: 99 mmol/L (ref 98–110)
Creat: 1.06 mg/dL (ref 0.70–1.18)
GFR, Est African American: 80 mL/min (ref >=60)
GFR, Est Non African American: 69 mL/min (ref >=60)
Glucose, Bld: 100 mg/dL — ABNORMAL HIGH (ref 65–99)
Potassium: 4.2 mmol/L (ref 3.5–5.3)
Sodium: 139 mmol/L (ref 135–146)

## 2015-12-08 LAB — HEPATIC FUNCTION PANEL
ALK PHOS: 65 U/L (ref 40–115)
ALT: 39 U/L (ref 9–46)
AST: 39 U/L — AB (ref 10–35)
Albumin: 4.2 g/dL (ref 3.6–5.1)
BILIRUBIN DIRECT: 0.1 mg/dL (ref <=0.2)
BILIRUBIN INDIRECT: 0.7 mg/dL (ref 0.2–1.2)
Total Bilirubin: 0.8 mg/dL (ref 0.2–1.2)
Total Protein: 7.2 g/dL (ref 6.1–8.1)

## 2015-12-08 LAB — LIPID PANEL
Cholesterol: 183 mg/dL (ref 125–200)
HDL: 33 mg/dL — ABNORMAL LOW (ref >=40)
LDL Cholesterol: 115 mg/dL (ref <130)
Total CHOL/HDL Ratio: 5.5 Ratio — ABNORMAL HIGH (ref <=5.0)
Triglycerides: 176 mg/dL — ABNORMAL HIGH (ref <150)
VLDL: 35 mg/dL — ABNORMAL HIGH (ref <30)

## 2015-12-08 LAB — MAGNESIUM: Magnesium: 1.9 mg/dL (ref 1.5–2.5)

## 2015-12-08 LAB — TSH: TSH: 4.11 mIU/L (ref 0.40–4.50)

## 2015-12-08 MED ORDER — TOPIRAMATE 50 MG PO TABS: 50.0000 mg | ORAL_TABLET | Freq: Two times a day (BID) | ORAL | Status: DC

## 2015-12-08 MED ORDER — TERBINAFINE HCL 1 % EX CREA: 1.0000 "application " | TOPICAL_CREAM | Freq: Every day | CUTANEOUS | Status: DC

## 2015-12-08 NOTE — Patient Instructions (Signed)
Going to start you on topamax, start on 1/2 pill for 3-5 nights, can increase to a whole pill for 1-2 weeks, and can go up to 38m at night (2 pills). Let me know how you are doing with this.   If this does not help or if you have side effects like forgetting, dizziness then we will try cymbalta 380m

## 2015-12-08 NOTE — Progress Notes (Signed)
Assessment and Plan:   1. Essential hypertension - continue medications, DASH diet, exercise and monitor at home. Call if greater than 130/80.  - CBC with Differential/Platelet - BASIC METABOLIC PANEL WITH GFR - Hepatic function panel  2. Abnormal glucose - Hemoglobin A1c  3. Hypothyroidism, unspecified hypothyroidism type Hypothyroidism-check TSH level, continue medications the same, reminded to take on an empty stomach 30-30mns before food.  - TSH  4. Hyperlipidemia -continue medications, check lipids, decrease fatty foods, increase activity.  - Lipid panel  5. COPD No exacerbation, monitor breathing  6. Vitamin D deficiency Continue supplement  7. 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 Chronic pain syndrome Has does a great job of decreasing medications on his own Will try topamax to see if this help decrease need of pain meds, if this does not help discussed trying cymbalta with the patient.  - topiramate (TOPAMAX) 50 MG tablet; Take 1 tablet (50 mg total) by mouth 2 (two) times daily.  Dispense: 60 tablet; Refill: 2   Continue diet and meds as discussed. Further disposition pending results of labs. Future Appointments Date Time Provider [place] 06/16/2016 9:00 AM WUnk Pinto MD [facility] None    HPI 75y.o. male  presents for 3 month follow up with hypertension, hyperlipidemia, prediabetes and vitamin D. His blood pressure has been controlled at home, today their BP is BP: 130/70 mmHg He does not workout. He denies chest pain, shortness of breath, dizziness.  He is not on cholesterol medication and denies myalgias. His cholesterol is at goal. The cholesterol last visit was:   Lab Results  Component Value Date   CHOL 185 05/26/2015   HDL 35* 05/26/2015   LDLCALC 132* 05/26/2015   TRIG 92 05/26/2015   CHOLHDL 5.3* 05/26/2015   He has been working on  diet and exercise for prediabetes, and denies polydipsia and polyuria. Last A1C in the office was:  Lab Results  Component Value Date   HGBA1C 6.4* 05/26/2015   Lab Results  Component Value Date   GFRNONAA 67 05/26/2015   Patient is on Vitamin D supplement.   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 PHardin Negusfor pain management but he has been reducing his oxycodone due to swelling, he is on 538mdaily.  He is on thyroid medication. His medication was changed last visit, 5051mdaily.   Lab Results  Component Value Date   TSH 5.620* 05/26/2015  .  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 He states that the testosterone helps with his energy, libido, muscle mass. Lab Results  Component Value Date   TESTOSTERONE 375 05/26/2015   BMI is Body mass index is 28.5 kg/(m^2)., he is working on diet and exercise. Wt Readings from Last 3 Encounters:  12/08/15 178 lb (80.74 kg)  05/26/15 179 lb 12.8 oz (81.557 kg)  08/06/14 178 lb (80.74 kg)    Current Medications:  Current Outpatient Prescriptions on File Prior to Visit  Medication Sig Dispense Refill  . Calcium Carbonate-Vit D-Min (CALCIUM 1200 PO) Take by mouth daily.    . Cholecalciferol (VITAMIN D PO) Take 5,000 Int'l Units by mouth daily.    . clotrimazole-betamethasone (LOTRISONE) cream APPLY TWICE A DAY 15 g 2  .  Cyanocobalamin (VITAMIN B 12 PO) Take 2,500 mcg by mouth 2 (two) times daily.    . furosemide (LASIX) 40 MG tablet TAKE 1 TABLET BY MOUTH EVERY DAY 90 tablet 0  . MAGNESIUM PO Take 500 mg by mouth 2 (two) times daily.     . metolazone (ZAROXOLYN) 5 MG tablet TAKE 1/2 TO 1 TABLET BY MOUTH EVERY OTHER DAY AS DIRECTED 90 tablet 1  . MILK THISTLE PO Take 240 mg by mouth daily.    . Omega-3 Fatty Acids (OMEGA-3 FISH OIL PO) Take by mouth daily.    Marland Kitchen  omeprazole (PRILOSEC) 20 MG capsule Take 1 capsule (20 mg total) by mouth daily. 90 capsule 3  . OVER THE COUNTER MEDICATION Thyroid complex 2 tabs daily.    Marland Kitchen OVER THE COUNTER MEDICATION Kelp with Iodine 1 daily    . OVER THE COUNTER MEDICATION Tumeric Curcumin 500 mg 2 times daily    . oxyCODONE (OXY IR/ROXICODONE) 5 MG immediate release tablet TAKE 1 TABLET EVERY 4 HOURS AS NEEDED  0  . Oxycodone HCl 10 MG TABS Take 10 mg by mouth. Reported on 12/08/2015    . Red Yeast Rice 600 MG CAPS Take 600 mg by mouth 3 (three) times daily.    . Sennosides (SENOKOT PO) Take by mouth as needed.    . tamsulosin (FLOMAX) 0.4 MG CAPS capsule Take 0.4 mg by mouth daily.  0  . Testosterone 10 MG/ACT (2%) GEL Place 2 ML/mg topically each day 120 g 1   No current facility-administered medications on file prior to visit.   Medical History:  Past Medical History  Diagnosis Date  . Hypothyroidism   . Shortness of breath     from oxycodone at times  . H/O hiatal hernia   . Hiatal hernia   . Esophageal stricture   . Celiac disease     diagnoses 06/24/12  . Hypertension   . Anemia   . Prediabetes   . Vitamin D deficiency   . Arthritis   . Vitamin B12 deficiency    Allergies:  Allergies  Allergen Reactions  . Feraheme [Ferumoxytol] Other (See Comments)    Swelling of lips and tongue, could not talk 30 minutes after the infusion.  . Naprosyn [Naproxen] Swelling    Unsure if naprosyn caused this reaction  . Levothyroxine     Lip swelling   Review of Systems  Constitutional: Negative.   HENT: Negative for congestion, ear discharge, ear pain, hearing loss, nosebleeds, sore throat and tinnitus.   Eyes: Negative.   Respiratory: Negative for cough, hemoptysis, sputum production, shortness of breath, wheezing and stridor.   Cardiovascular: Positive for leg swelling. Negative for chest pain, palpitations, orthopnea, claudication and PND.  Gastrointestinal: Positive for constipation. Negative for  heartburn, nausea, vomiting, abdominal pain, diarrhea, blood in stool and melena.  Genitourinary: Negative for dysuria, urgency, frequency, hematuria and flank pain.  Musculoskeletal: Positive for myalgias, back pain and joint pain. Negative for falls and neck pain.  Skin: Positive for itching. Negative for rash.  Neurological: Negative for dizziness, tingling, tremors, sensory change, speech change, focal weakness, seizures, loss of consciousness and headaches.  Endo/Heme/Allergies: Negative.   Psychiatric/Behavioral: Negative.      Family history- Review and unchanged Social history- Review and unchanged Physical Exam: BP 130/70 mmHg  Pulse 81  Temp(Src) 97.9 F (36.6 C) (Temporal)  Resp 14  Ht 5' 6.25" (1.683 m)  Wt 178 lb (80.74 kg)  BMI 28.50 kg/m2  SpO2 95% Wt  Readings from Last 3 Encounters:  12/08/15 178 lb (80.74 kg)  05/26/15 179 lb 12.8 oz (81.557 kg)  08/06/14 178 lb (80.74 kg)   General Appearance: Well nourished, in no apparent distress. Eyes: PERRLA, EOMs, conjunctiva no swelling or erythema Sinuses: No Frontal/maxillary tenderness ENT/Mouth: Ext aud canals clear, cerumen impaction right ear with manual removal of ear wax, tolerated well, TMs without erythema, bulging. No erythema, swelling, or exudate on post pharynx.  Tonsils not swollen or erythematous. Hearing normal.  Neck: Supple, thyroid normal.  Respiratory: Respiratory effort normal, BS equal bilaterally without rales, rhonchi, wheezing or stridor.  Cardio: RRR with no MRGs. Brisk peripheral pulses with 1+ edema.  Abdomen: Soft, + BS.  Mild epigastric tenderness with very small ventral hernias, no guarding, rebound,masses. Lymphatics: Non tender without lymphadenopathy.  Musculoskeletal: Full ROM, 5/5 strength, normal gait.  Skin: Warm, dry without rashes, lesions, ecchymosis.  Neuro: Cranial nerves intact. Normal muscle tone, no cerebellar symptoms. Sensation intact.  Psych: Awake and oriented X 3,  normal affect, Insight and Judgment appropriate.    Vicie Mutters 11:20 AM  Error: should be cosigned by Dr. Melford Aase

## 2015-12-09 LAB — HEMOGLOBIN A1C
HEMOGLOBIN A1C: 6.3 % — AB (ref <5.7)
Mean Plasma Glucose: 134 mg/dL

## 2015-12-16 ENCOUNTER — Encounter: Payer: Self-pay | Admitting: Physician Assistant

## 2015-12-31 DIAGNOSIS — G894 Chronic pain syndrome: Secondary | ICD-10-CM | POA: Diagnosis not present

## 2015-12-31 DIAGNOSIS — Z79891 Long term (current) use of opiate analgesic: Secondary | ICD-10-CM | POA: Diagnosis not present

## 2015-12-31 DIAGNOSIS — M546 Pain in thoracic spine: Secondary | ICD-10-CM | POA: Diagnosis not present

## 2015-12-31 DIAGNOSIS — M40204 Unspecified kyphosis, thoracic region: Secondary | ICD-10-CM | POA: Diagnosis not present

## 2016-01-07 ENCOUNTER — Other Ambulatory Visit: Payer: Self-pay | Admitting: Physician Assistant

## 2016-01-28 ENCOUNTER — Other Ambulatory Visit: Payer: Self-pay | Admitting: Physician Assistant

## 2016-02-11 ENCOUNTER — Other Ambulatory Visit: Payer: Self-pay | Admitting: Physician Assistant

## 2016-02-25 DIAGNOSIS — Z79891 Long term (current) use of opiate analgesic: Secondary | ICD-10-CM | POA: Diagnosis not present

## 2016-02-25 DIAGNOSIS — G894 Chronic pain syndrome: Secondary | ICD-10-CM | POA: Diagnosis not present

## 2016-02-25 DIAGNOSIS — M546 Pain in thoracic spine: Secondary | ICD-10-CM | POA: Diagnosis not present

## 2016-02-25 DIAGNOSIS — M40204 Unspecified kyphosis, thoracic region: Secondary | ICD-10-CM | POA: Diagnosis not present

## 2016-03-29 DIAGNOSIS — Z23 Encounter for immunization: Secondary | ICD-10-CM | POA: Diagnosis not present

## 2016-04-20 DIAGNOSIS — M150 Primary generalized (osteo)arthritis: Secondary | ICD-10-CM | POA: Diagnosis not present

## 2016-04-20 DIAGNOSIS — M546 Pain in thoracic spine: Secondary | ICD-10-CM | POA: Diagnosis not present

## 2016-04-20 DIAGNOSIS — M40204 Unspecified kyphosis, thoracic region: Secondary | ICD-10-CM | POA: Diagnosis not present

## 2016-04-20 DIAGNOSIS — Z79891 Long term (current) use of opiate analgesic: Secondary | ICD-10-CM | POA: Diagnosis not present

## 2016-04-20 DIAGNOSIS — L97922 Non-pressure chronic ulcer of unspecified part of left lower leg with fat layer exposed: Secondary | ICD-10-CM | POA: Diagnosis not present

## 2016-04-20 DIAGNOSIS — M25561 Pain in right knee: Secondary | ICD-10-CM | POA: Diagnosis not present

## 2016-04-21 DIAGNOSIS — M40204 Unspecified kyphosis, thoracic region: Secondary | ICD-10-CM | POA: Diagnosis not present

## 2016-04-21 DIAGNOSIS — G894 Chronic pain syndrome: Secondary | ICD-10-CM | POA: Diagnosis not present

## 2016-04-21 DIAGNOSIS — M546 Pain in thoracic spine: Secondary | ICD-10-CM | POA: Diagnosis not present

## 2016-04-21 DIAGNOSIS — Z79891 Long term (current) use of opiate analgesic: Secondary | ICD-10-CM | POA: Diagnosis not present

## 2016-04-23 ENCOUNTER — Other Ambulatory Visit: Payer: Self-pay | Admitting: Internal Medicine

## 2016-04-23 DIAGNOSIS — I10 Essential (primary) hypertension: Secondary | ICD-10-CM

## 2016-06-14 ENCOUNTER — Other Ambulatory Visit: Payer: Self-pay | Admitting: Physician Assistant

## 2016-06-16 ENCOUNTER — Encounter: Payer: Self-pay | Admitting: Internal Medicine

## 2016-06-16 ENCOUNTER — Ambulatory Visit (INDEPENDENT_AMBULATORY_CARE_PROVIDER_SITE_OTHER): Payer: Medicare Other | Admitting: Internal Medicine

## 2016-06-16 VITALS — BP 134/72 | HR 76 | Temp 97.7°F | Resp 16 | Ht 66.0 in | Wt 181.8 lb

## 2016-06-16 DIAGNOSIS — I10 Essential (primary) hypertension: Secondary | ICD-10-CM | POA: Diagnosis not present

## 2016-06-16 DIAGNOSIS — Z136 Encounter for screening for cardiovascular disorders: Secondary | ICD-10-CM | POA: Diagnosis not present

## 2016-06-16 DIAGNOSIS — J449 Chronic obstructive pulmonary disease, unspecified: Secondary | ICD-10-CM

## 2016-06-16 DIAGNOSIS — N182 Chronic kidney disease, stage 2 (mild): Secondary | ICD-10-CM

## 2016-06-16 DIAGNOSIS — Z1212 Encounter for screening for malignant neoplasm of rectum: Secondary | ICD-10-CM

## 2016-06-16 DIAGNOSIS — E119 Type 2 diabetes mellitus without complications: Secondary | ICD-10-CM

## 2016-06-16 DIAGNOSIS — Z125 Encounter for screening for malignant neoplasm of prostate: Secondary | ICD-10-CM | POA: Diagnosis not present

## 2016-06-16 DIAGNOSIS — N320 Bladder-neck obstruction: Secondary | ICD-10-CM | POA: Diagnosis not present

## 2016-06-16 DIAGNOSIS — E782 Mixed hyperlipidemia: Secondary | ICD-10-CM

## 2016-06-16 DIAGNOSIS — E349 Endocrine disorder, unspecified: Secondary | ICD-10-CM

## 2016-06-16 DIAGNOSIS — E559 Vitamin D deficiency, unspecified: Secondary | ICD-10-CM

## 2016-06-16 DIAGNOSIS — Z79899 Other long term (current) drug therapy: Secondary | ICD-10-CM | POA: Diagnosis not present

## 2016-06-16 DIAGNOSIS — K219 Gastro-esophageal reflux disease without esophagitis: Secondary | ICD-10-CM

## 2016-06-16 DIAGNOSIS — G894 Chronic pain syndrome: Secondary | ICD-10-CM

## 2016-06-16 LAB — TSH: TSH: 9.63 m[IU]/L — AB (ref 0.40–4.50)

## 2016-06-16 LAB — MICROALBUMIN / CREATININE URINE RATIO
Creatinine, Urine: 39 mg/dL (ref 20–370)
MICROALB UR: 0.2 mg/dL
MICROALB/CREAT RATIO: 5 ug/mg{creat} (ref <30)

## 2016-06-16 LAB — CBC WITH DIFFERENTIAL/PLATELET
BASOS PCT: 1 %
Basophils Absolute: 68 cells/uL (ref 0–200)
EOS ABS: 272 {cells}/uL (ref 15–500)
Eosinophils Relative: 4 %
HEMATOCRIT: 35.9 % — AB (ref 38.5–50.0)
HEMOGLOBIN: 11.4 g/dL — AB (ref 13.2–17.1)
LYMPHS ABS: 2924 {cells}/uL (ref 850–3900)
LYMPHS PCT: 43 %
MCH: 26.5 pg — ABNORMAL LOW (ref 27.0–33.0)
MCHC: 31.8 g/dL — ABNORMAL LOW (ref 32.0–36.0)
MCV: 83.3 fL (ref 80.0–100.0)
MONO ABS: 544 {cells}/uL (ref 200–950)
MPV: 9.3 fL (ref 7.5–12.5)
Monocytes Relative: 8 %
Neutro Abs: 2992 cells/uL (ref 1500–7800)
Neutrophils Relative %: 44 %
Platelets: 494 10*3/uL — ABNORMAL HIGH (ref 140–400)
RBC: 4.31 MIL/uL (ref 4.20–5.80)
RDW: 17.8 % — ABNORMAL HIGH (ref 11.0–15.0)
WBC: 6.8 10*3/uL (ref 3.8–10.8)

## 2016-06-16 LAB — BASIC METABOLIC PANEL WITH GFR
BUN: 19 mg/dL (ref 7–25)
CHLORIDE: 95 mmol/L — AB (ref 98–110)
CO2: 32 mmol/L — AB (ref 20–31)
Calcium: 10 mg/dL (ref 8.6–10.3)
Creat: 1.16 mg/dL (ref 0.70–1.18)
GFR, EST AFRICAN AMERICAN: 71 mL/min (ref >=60)
GFR, EST NON AFRICAN AMERICAN: 61 mL/min (ref >=60)
GLUCOSE: 104 mg/dL — AB (ref 65–99)
POTASSIUM: 3.9 mmol/L (ref 3.5–5.3)
Sodium: 135 mmol/L (ref 135–146)

## 2016-06-16 LAB — URINALYSIS, ROUTINE W REFLEX MICROSCOPIC
BILIRUBIN URINE: NEGATIVE
GLUCOSE, UA: NEGATIVE
Hgb urine dipstick: NEGATIVE
Ketones, ur: NEGATIVE
LEUKOCYTES UA: NEGATIVE
Nitrite: NEGATIVE
PROTEIN: NEGATIVE
Specific Gravity, Urine: 1.008 (ref 1.001–1.035)
pH: 7 (ref 5.0–8.0)

## 2016-06-16 LAB — HEPATIC FUNCTION PANEL
ALBUMIN: 4.4 g/dL (ref 3.6–5.1)
ALK PHOS: 57 U/L (ref 40–115)
ALT: 35 U/L (ref 9–46)
AST: 38 U/L — AB (ref 10–35)
BILIRUBIN INDIRECT: 0.7 mg/dL (ref 0.2–1.2)
Bilirubin, Direct: 0.1 mg/dL (ref <=0.2)
TOTAL PROTEIN: 7.8 g/dL (ref 6.1–8.1)
Total Bilirubin: 0.8 mg/dL (ref 0.2–1.2)

## 2016-06-16 LAB — PSA: PSA: 0.5 ng/mL (ref <=4.0)

## 2016-06-16 LAB — HEMOGLOBIN A1C
HEMOGLOBIN A1C: 6 % — AB (ref <5.7)
Mean Plasma Glucose: 126 mg/dL

## 2016-06-16 LAB — LIPID PANEL
CHOL/HDL RATIO: 5.8 ratio — AB (ref <5.0)
Cholesterol: 186 mg/dL (ref <200)
HDL: 32 mg/dL — ABNORMAL LOW (ref >40)
LDL CALC: 123 mg/dL — AB (ref <100)
Triglycerides: 153 mg/dL — ABNORMAL HIGH (ref <150)
VLDL: 31 mg/dL — AB (ref <30)

## 2016-06-16 LAB — MAGNESIUM: Magnesium: 2.1 mg/dL (ref 1.5–2.5)

## 2016-06-16 LAB — TESTOSTERONE: Testosterone: 336 ng/dL (ref 250–827)

## 2016-06-16 NOTE — Progress Notes (Signed)
[facility]   Unk Pinto, M.D.    Uvaldo Bristle. Silverio Lay, P.A.-C      Starlyn Skeans, P.A.-C  [facility]                [address] [place], [place] [address] Telephone [phone] Telefax [phone] Comprehensive Evaluation & Examination     This very nice 76 y.o.  MWMpresents for a  comprehensive evaluation and management of multiple medical co-morbidities.  Patient has been followed for HTN, T2_NIDDM, Hyperlipidemia and Vitamin D Deficiency.      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 Patient also has hx/o low B12 in the past. Patient has GERD which is quiescent on current meds & diet.      HTN predates circa 2000. Patient's BP has been controlled at home.  Today's BP is at goal - 134/72. Patient denies any cardiac symptoms as chest pain, palpitations, shortness of breath, dizziness, but patient does have hx/o chronic ankle swelling - improved since he has tapered his opioid dosing.     Patient's hyperlipidemia is not controlled with diet and supplements (RYRE). Patient denies myalgias or other medication SE's. Last lipids were not at goal: Lab Results  Component Value Date   CHOL 186 06/16/2016   HDL 32 (L) 06/16/2016   LDLCALC 123 (H) 06/16/2016   TRIG 153 (H) 06/16/2016   CHOLHDL 5.8 (H) 06/16/2016      Patient has T2_NIDDM  with A1c 6.5% in Dec 2015 and he is attempting control with diet and patient denies reactive hypoglycemic symptoms, visual blurring, diabetic polys or paresthesias. Last A1c was not at goal: Lab Results  Component Value Date   HGBA1C 6.3 (H) 12/08/2015       In 2011, he has started on Thyroid replacement. Patient also has low Testosterone and finally, patient has history of Vitamin D Deficiency in 2009 of "26"  and last vitamin D  was at goal: Lab Results  Component Value Date   VD25OH 74 05/26/2015   Current Outpatient Prescriptions on File Prior to Visit  Medication Sig  . CALCIUM 1200  Take by mouth daily.  Marland Kitchen VITAMIN D  Take 5,000 Int'l Units by mouth daily.  Marland Kitchen VITAMIN B 12 tab Take 2,500 mcg by mouth 2 (two) times daily.  . Furosemide 40 MG  TAKE 1 TABLET BY MOUTH EVERY DAY  . MAGNESIUM PO Take 500 mg by mouth 2 (two) times daily.   . metolazone  5 MG  Take 1/2 to 1 tablet daily or as directed for fluid & swelling  . MILK THISTLE  Take 240 mg by mouth daily.  . Omega-3  FISH OIL  Take by mouth daily.  . Omeprazole 20 MG  Take 1 capsule (20 mg total) by mouth daily.  . Thyroid complex  2 tabs daily.  Marland Kitchen Kelp with Iodine  1 daily  . Tumeric Curcumin 500 mg  2 times daily  . oxyCODONE  IR 5 MG  TAKE 1 TABLET EVERY 4 HOURS AS NEEDED  . Oxycodone HCl 10 MG TABS Take 10 mg by mouth. Reported on 12/08/2015  . Red Yeast Rice 600 MG CAPS Take 600 mg by mouth 3 (three) times daily.  Donavan Burnet  Take by mouth  as needed.  . tamsulosin  0.4 MG CAPS Take 0.4 mg by mouth daily.  Marland Kitchen LAMISIL 1 % cream Apply 1 application topically daily. For 4 weeks  . Testosterone 10 MG/ (2%) GEL Place 2 ML/mg topically each day  . topiramate  50 MG tablet Take 1 tablet (50 mg total) by mouth 2 (two) times daily.   Allergies  Allergen Reactions  . Feraheme [Ferumoxytol] Other (See Comments)    Swelling of lips and tongue, could not talk 30 minutes after the infusion.  . Naprosyn [Naproxen] Swelling    Unsure if naprosyn caused this reaction  . Levothyroxine     Lip swelling   Past Medical History:  Diagnosis Date  . Anemia   . Arthritis   . Celiac disease    diagnoses 06/24/12  . Esophageal stricture   . H/O hiatal hernia   . Hiatal hernia   . Hypertension   . Hypothyroidism   . Prediabetes   . Shortness of breath    from oxycodone at times  . Vitamin B12 deficiency   . Vitamin D deficiency    Health Maintenance  Topic  Date Due  . OPHTHALMOLOGY EXAM  12/25/1950  . FOOT EXAM  05/21/2015  . URINE MICROALBUMIN  05/25/2016  . HEMOGLOBIN A1C  06/08/2016  . TETANUS/TDAP  01/11/2021  . COLONOSCOPY  05/27/2022  . INFLUENZA VACCINE  Completed  . ZOSTAVAX  Completed  . PNA vac Low Risk Adult  Completed   Immunization History  Administered Date(s) Administered  . DT 01/12/2011  . Influenza, High Dose Seasonal PF 05/20/2014, 05/26/2015  . Influenza-Unspecified 04/30/2013, 03/29/2016  . Pneumococcal Conjugate-13 05/20/2014  . Pneumococcal Polysaccharide-23 03/29/2016  . Pneumococcal-Unspecified 05/20/2008  . Zoster 07/15/2014   Past Surgical History:  Procedure Laterality Date  . BACK SURGERY     3 diff surgeries for vertebrea broken  . EYE SURGERY     bilateral cataract surgery  . HERNIA REPAIR  1950's   bilateral groin as child  . HIATAL HERNIA REPAIR    . JOINT REPLACEMENT  04/2010   right knee replacement  . TONSILLECTOMY     as child  . TOTAL KNEE REVISION  11/20/2011   Procedure: TOTAL KNEE REVISION;  Surgeon: Mauri Pole, MD;  Location: [facility];  Service: Orthopedics;  Laterality: Right;  Right Total Knee Revision   Family History  Problem Relation Age of Onset  . Esophageal cancer Father    Social History   Social History  . Marital status: Married    Spouse name: N/A  . Number of children: 2  . Years of education: N/A   Occupational History  . Retired Civil Service [employer]   Social History Main Topics  . Smoking status: Former Smoker    Packs/day: 1.00    Years: 20.00    Types: Cigarettes    Quit date: 06/27/1980  . Smokeless tobacco: Never Used  . Alcohol use No  . Drug use: No    ROS Constitutional: Denies fever, chills, weight loss/gain, headaches, insomnia,  night sweats or change in appetite. Does c/o fatigue. Eyes: Denies redness, blurred vision, diplopia, discharge, itchy or watery eyes.  ENT: Denies discharge, congestion, post nasal drip, epistaxis, sore  throat, earache, hearing loss, dental pain, Tinnitus, Vertigo, Sinus pain or snoring.  Cardio: Denies chest pain, palpitations, irregular heartbeat, syncope, dyspnea, diaphoresis, orthopnea, PND, claudication or edema Respiratory: denies cough, dyspnea, DOE, pleurisy, hoarseness, laryngitis or wheezing.  Gastrointestinal: Denies dysphagia, heartburn, reflux, water  brash, pain, cramps, nausea, vomiting, bloating, diarrhea, constipation, hematemesis, melena, hematochezia, jaundice or hemorrhoids Genitourinary: Denies dysuria, frequency, urgency, hesitancy, discharge, hematuria or flank pain. Has nocturia x 3 and slow stream.  Musculoskeletal:  Denies Falls. Skin: Denies puritis, rash, hives, warts, acne, eczema or change in skin lesion Neuro: No weakness, tremor, incoordination, spasms, paresthesia or pain Psychiatric: Denies confusion, memory loss or sensory loss. Denies Depression. Endocrine: Denies change in weight, skin, hair change, nocturia, and paresthesia, diabetic polys, visual blurring or hyper / hypo glycemic episodes.  Heme/Lymph: No excessive bleeding, bruising or enlarged lymph nodes.  Physical Exam  BP 134/72   Pulse 76   Temp 97.7 F (36.5 C)   Resp 16   Ht 5' 6"  (1.676 m)   Wt 181 lb 12.8 oz (82.5 kg)   BMI 29.34 kg/m   General Appearance: Well nourished, in no apparent distress.  Eyes: PERRLA, EOMs, conjunctiva no swelling or erythema, normal fundi and vessels. Sinuses: No frontal/maxillary tenderness ENT/Mouth: EACs patent / TMs  nl. Nares clear without erythema, swelling, mucoid exudates. Oral hygiene is good. No erythema, swelling, or exudate. Tongue normal, non-obstructing. Tonsils not swollen or erythematous. Hearing normal.  Neck: Supple, thyroid normal. No bruits, nodes or JVD. Respiratory: Barrel chested with increased AP diameter. Respiratory effort normal.  BS distant, equal and clear bilateral without rales, rhonci, wheezing or stridor. Cardio: Heart sounds  are normal with regular rate and rhythm and no murmurs, rubs or gallops. Peripheral pulses are normal and equal bilaterally with 1(+) ankle edema. No aortic or femoral bruits. Chest: symmetric with normal excursions and percussion.  Abdomen: Soft, with Nl bowel sounds. Nontender, no guarding, rebound, hernias, masses, or organomegaly.  Lymphatics: Non tender without lymphadenopathy.  Genitourinary: No hernias.Testes nl. DRE - prostate nl for age - smooth & firm w/o nodules. Musculoskeletal: Full ROM all peripheral extremities, joint stability, 5/5 strength, and normal gait. Skin: Warm and dry without rashes, lesions, cyanosis, clubbing or  ecchymosis.  Neuro: Cranial nerves intact, reflexes equal bilaterally. Normal muscle tone, no cerebellar symptoms. Sensation intact to touch, Vibratory and Monofilament testing to the toes bilaterally.  Pysch: Alert and oriented X 3 with normal affect, insight and judgment appropriate.   Assessment and Plan  1. Essential hypertension  - Microalbumin / creatinine urine ratio - EKG 12-Lead - [place], RETROPERITNL ABD,  LTD - Urinalysis, Routine w reflex microscopic - CBC with Differential/Platelet - BASIC METABOLIC PANEL WITH GFR - TSH  2. Mixed hyperlipidemia  - EKG 12-Lead - [place], RETROPERITNL ABD,  LTD - Hepatic function panel - Lipid panel - TSH  3. Diabetes mellitus without complication ([place])  - EKG 12-Lead - [place], RETROPERITNL ABD,  LTD - Hemoglobin A1c - Insulin, random  4. Vitamin D deficiency  - VITAMIN D 25 Hydroxy   5. 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 Chronic obstructive pulmonary disease  ([place])   8. Chronic pain syndrome   9. Testosterone deficiency  - Testosterone  10. Screening for rectal cancer  - POC Hemoccult Bld/Stl   11. Prostate cancer screening  - PSA  12. Screening for ischemic heart disease  - EKG 12-Lead  13. Screening for AAA (aortic  abdominal aneurysm)  - [place], RETROPERITNL ABD,  LTD   14. Medication management  - Urinalysis, Routine w reflex microscopic - CBC with Differential/Platelet - BASIC METABOLIC PANEL WITH GFR - Hepatic function panel - Magnesium  15. Bladder neck obstruction  -  PSA       Continue prudent diet as discussed, weight control, BP monitoring, regular exercise, and medications as discussed.  Discussed med effects and SE's. Routine screening labs and tests as requested with regular follow-up as recommended. Over 40 minutes of exam, counseling, chart review and high complex critical decision making was performed

## 2016-06-16 NOTE — Patient Instructions (Signed)
Preventive Care for Adults A healthy lifestyle and preventive care can promote health and wellness. Preventive health guidelines for men include the following key practices:  A routine yearly physical is a good way to check with your health care provider about your health and preventative screening. It is a chance to share any concerns and updates on your health and to receive a thorough exam.  Visit your dentist for a routine exam and preventative care every 6 months. Brush your teeth twice a day and floss once a day. Good oral hygiene prevents tooth decay and gum disease.  The frequency of eye exams is based on your age, health, family medical history, use of contact lenses, and other factors. Follow your health care provider's recommendations for frequency of eye exams.  Eat a healthy diet. Foods such as vegetables, fruits, whole grains, low-fat dairy products, and lean protein foods contain the nutrients you need without too many calories. Decrease your intake of foods high in solid fats, added sugars, and salt. Eat the right amount of calories for you.Get information about a proper diet from your health care provider, if necessary.  Regular physical exercise is one of the most important things you can do for your health. Most adults should get at least 150 minutes of moderate-intensity exercise (any activity that increases your heart rate and causes you to sweat) each week. In addition, most adults need muscle-strengthening exercises on 2 or more days a week.  Maintain a healthy weight. The body mass index (BMI) is a screening tool to identify possible weight problems. It provides an estimate of body fat based on height and weight. Your health care provider can find your BMI and can help you achieve or maintain a healthy weight.For adults 20 years and older:  A BMI below 18.5 is considered underweight.  A BMI of 18.5 to 24.9 is normal.  A BMI of 25 to 29.9 is considered overweight.  A BMI  of 30 and above is considered obese.  Maintain normal blood lipids and cholesterol levels by exercising and minimizing your intake of saturated fat. Eat a balanced diet with plenty of fruit and vegetables. Blood tests for lipids and cholesterol should begin at age 50 and be repeated every 5 years. If your lipid or cholesterol levels are high, you are over 50, or you are at high risk for heart disease, you may need your cholesterol levels checked more frequently.Ongoing high lipid and cholesterol levels should be treated with medicines if diet and exercise are not working.  If you smoke, find out from your health care provider how to quit. If you do not use tobacco, do not start.  Lung cancer screening is recommended for adults aged 73-80 years who are at high risk for developing lung cancer because of a history of smoking. A yearly low-dose CT scan of the lungs is recommended for people who have at least a 30-pack-year history of smoking and are a current smoker or have quit within the past 15 years. A pack year of smoking is smoking an average of 1 pack of cigarettes a day for 1 year (for example: 1 pack a day for 30 years or 2 packs a day for 15 years). Yearly screening should continue until the smoker has stopped smoking for at least 15 years. Yearly screening should be stopped for people who develop a health problem that would prevent them from having lung cancer treatment.  If you choose to drink alcohol, do not have more than  2 drinks per day. One drink is considered to be 12 ounces (355 mL) of beer, 5 ounces (148 mL) of wine, or 1.5 ounces (44 mL) of liquor.  Avoid use of street drugs. Do not share needles with anyone. Ask for help if you need support or instructions about stopping the use of drugs.  High blood pressure causes heart disease and increases the risk of stroke. Your blood pressure should be checked at least every 1-2 years. Ongoing high blood pressure should be treated with  medicines, if weight loss and exercise are not effective.  If you are 45-79 years old, ask your health care provider if you should take aspirin to prevent heart disease.  Diabetes screening involves taking a blood sample to check your fasting blood sugar level. This should be done once every 3 years, after age 45, if you are within normal weight and without risk factors for diabetes. Testing should be considered at a younger age or be carried out more frequently if you are overweight and have at least 1 risk factor for diabetes.  Colorectal cancer can be detected and often prevented. Most routine colorectal cancer screening begins at the age of 50 and continues through age 75. However, your health care provider may recommend screening at an earlier age if you have risk factors for colon cancer. On a yearly basis, your health care provider may provide home test kits to check for hidden blood in the stool. Use of a small camera at the end of a tube to directly examine the colon (sigmoidoscopy or colonoscopy) can detect the earliest forms of colorectal cancer. Talk to your health care provider about this at age 50, when routine screening begins. Direct exam of the colon should be repeated every 5-10 years through age 75, unless early forms of precancerous polyps or small growths are found.  People who are at an increased risk for hepatitis B should be screened for this virus. You are considered at high risk for hepatitis B if:  You were born in a country where hepatitis B occurs often. Talk with your health care provider about which countries are considered high risk.  Your parents were born in a high-risk country and you have not received a shot to protect against hepatitis B (hepatitis B vaccine).  You have HIV or AIDS.  You use needles to inject street drugs.  You live with, or have sex with, someone who has hepatitis B.  You are a man who has sex with other men (MSM).  You get hemodialysis  treatment.  You take certain medicines for conditions such as cancer, organ transplantation, and autoimmune conditions.  Hepatitis C blood testing is recommended for all people born from 1945 through 1965 and any individual with known risks for hepatitis C.  Practice safe sex. Use condoms and avoid high-risk sexual practices to reduce the spread of sexually transmitted infections (STIs). STIs include gonorrhea, chlamydia, syphilis, trichomonas, herpes, HPV, and human immunodeficiency virus (HIV). Herpes, HIV, and HPV are viral illnesses that have no cure. They can result in disability, cancer, and death.  If you are at risk of being infected with HIV, it is recommended that you take a prescription medicine daily to prevent HIV infection. This is called preexposure prophylaxis (PrEP). You are considered at risk if:  You are a man who has sex with other men (MSM) and have other risk factors.  You are a heterosexual man, are sexually active, and are at increased risk for HIV infection.    You take drugs by injection.  You are sexually active with a partner who has HIV.  Talk with your health care provider about whether you are at high risk of being infected with HIV. If you choose to begin PrEP, you should first be tested for HIV. You should then be tested every 3 months for as long as you are taking PrEP.  A one-time screening for abdominal aortic aneurysm (AAA) and surgical repair of large AAAs by ultrasound are recommended for men ages 32 to 67 years who are current or former smokers.  Healthy men should no longer receive prostate-specific antigen (PSA) blood tests as part of routine cancer screening. Talk with your health care provider about prostate cancer screening.  Testicular cancer screening is not recommended for adult males who have no symptoms. Screening includes self-exam, a health care provider exam, and other screening tests. Consult with your health care provider about any symptoms  you have or any concerns you have about testicular cancer.  Use sunscreen. Apply sunscreen liberally and repeatedly throughout the day. You should seek shade when your shadow is shorter than you. Protect yourself by wearing long sleeves, pants, a wide-brimmed hat, and sunglasses year round, whenever you are outdoors.  Once a month, do a whole-body skin exam, using a mirror to look at the skin on your back. Tell your health care provider about new moles, moles that have irregular borders, moles that are larger than a pencil eraser, or moles that have changed in shape or color.  Stay current with required vaccines (immunizations).  Influenza vaccine. All adults should be immunized every year.  Tetanus, diphtheria, and acellular pertussis (Td, Tdap) vaccine. An adult who has not previously received Tdap or who does not know his vaccine status should receive 1 dose of Tdap. This initial dose should be followed by tetanus and diphtheria toxoids (Td) booster doses every 10 years. Adults with an unknown or incomplete history of completing a 3-dose immunization series with Td-containing vaccines should begin or complete a primary immunization series including a Tdap dose. Adults should receive a Td booster every 10 years.  Varicella vaccine. An adult without evidence of immunity to varicella should receive 2 doses or a second dose if he has previously received 1 dose.  Human papillomavirus (HPV) vaccine. Males aged 68-21 years who have not received the vaccine previously should receive the 3-dose series. Males aged 22-26 years may be immunized. Immunization is recommended through the age of 6 years for any male who has sex with males and did not get any or all doses earlier. Immunization is recommended for any person with an immunocompromised condition through the age of 49 years if he did not get any or all doses earlier. During the 3-dose series, the second dose should be obtained 4-8 weeks after the first  dose. The third dose should be obtained 24 weeks after the first dose and 16 weeks after the second dose.  Zoster vaccine. One dose is recommended for adults aged 50 years or older unless certain conditions are present.  Measles, mumps, and rubella (MMR) vaccine. Adults born before [birth year] generally are considered immune to measles and mumps. Adults born in [birth year] or later should have 1 or more doses of MMR vaccine unless there is a contraindication to the vaccine or there is laboratory evidence of immunity to each of the three diseases. A routine second dose of MMR vaccine should be obtained at least 28 days after the first dose for students attending postsecondary  schools, health care workers, or international travelers. People who received inactivated measles vaccine or an unknown type of measles vaccine during 1963-1967 should receive 2 doses of MMR vaccine. People who received inactivated mumps vaccine or an unknown type of mumps vaccine before 1979 and are at high risk for mumps infection should consider immunization with 2 doses of MMR vaccine. Unvaccinated health care workers born before 1957 who lack laboratory evidence of measles, mumps, or rubella immunity or laboratory confirmation of disease should consider measles and mumps immunization with 2 doses of MMR vaccine or rubella immunization with 1 dose of MMR vaccine.  Pneumococcal 13-valent conjugate (PCV13) vaccine. When indicated, a person who is uncertain of his immunization history and has no record of immunization should receive the PCV13 vaccine. An adult aged 19 years or older who has certain medical conditions and has not been previously immunized should receive 1 dose of PCV13 vaccine. This PCV13 should be followed with a dose of pneumococcal polysaccharide (PPSV23) vaccine. The PPSV23 vaccine dose should be obtained at least 8 weeks after the dose of PCV13 vaccine. An adult aged 19 years or older who has certain medical conditions and  previously received 1 or more doses of PPSV23 vaccine should receive 1 dose of PCV13. The PCV13 vaccine dose should be obtained 1 or more years after the last PPSV23 vaccine dose.  Pneumococcal polysaccharide (PPSV23) vaccine. When PCV13 is also indicated, PCV13 should be obtained first. All adults aged 65 years and older should be immunized. An adult younger than age 65 years who has certain medical conditions should be immunized. Any person who resides in a nursing home or long-term care facility should be immunized. An adult smoker should be immunized. People with an immunocompromised condition and certain other conditions should receive both PCV13 and PPSV23 vaccines. People with human immunodeficiency virus (HIV) infection should be immunized as soon as possible after diagnosis. Immunization during chemotherapy or radiation therapy should be avoided. Routine use of PPSV23 vaccine is not recommended for American Indians, [place] Natives, or people younger than 65 years unless there are medical conditions that require PPSV23 vaccine. When indicated, people who have unknown immunization and have no record of immunization should receive PPSV23 vaccine. One-time revaccination 5 years after the first dose of PPSV23 is recommended for people aged 19-64 years who have chronic kidney failure, nephrotic syndrome, asplenia, or immunocompromised conditions. People who received 1-2 doses of PPSV23 before age 65 years should receive another dose of PPSV23 vaccine at age 65 years or later if at least 5 years have passed since the previous dose. Doses of PPSV23 are not needed for people immunized with PPSV23 at or after age 65 years.  Meningococcal vaccine. Adults with asplenia or persistent complement component deficiencies should receive 2 doses of quadrivalent meningococcal conjugate (MenACWY-D) vaccine. The doses should be obtained at least 2 months apart. Microbiologists working with certain meningococcal bacteria,  military recruits, people at risk during an outbreak, and people who travel to or live in countries with a high rate of meningitis should be immunized. A first-year college student up through age 21 years who is living in a residence hall should receive a dose if he did not receive a dose on or after his 16th birthday. Adults who have certain high-risk conditions should receive one or more doses of vaccine.  Hepatitis A vaccine. Adults who wish to be protected from this disease, have certain high-risk conditions, work with hepatitis A-infected animals, work in hepatitis A research labs, or   travel to or work in countries with a high rate of hepatitis A should be immunized. Adults who were previously unvaccinated and who anticipate close contact with an international adoptee during the first 60 days after arrival in the [place] from a country with a high rate of hepatitis A should be immunized.  Hepatitis B vaccine. Adults should be immunized if they wish to be protected from this disease, have certain high-risk conditions, may be exposed to blood or other infectious body fluids, are household contacts or sex partners of hepatitis B positive people, are clients or workers in certain care facilities, or travel to or work in countries with a high rate of hepatitis B.  Haemophilus influenzae type b (Hib) vaccine. A previously unvaccinated person with asplenia or sickle cell disease or having a scheduled splenectomy should receive 1 dose of Hib vaccine. Regardless of previous immunization, a recipient of a hematopoietic stem cell transplant should receive a 3-dose series 6-12 months after his successful transplant. Hib vaccine is not recommended for adults with HIV infection. Preventive Service / Frequency Ages 52 to 17  Blood pressure check.** / Every 1 to 2 years.  Lipid and cholesterol check.** / Every 5 years beginning at age 69.  Hepatitis C blood test.** / For any individual with known risks for  hepatitis C.  Skin self-exam. / Monthly.  Influenza vaccine. / Every year.  Tetanus, diphtheria, and acellular pertussis (Tdap, Td) vaccine.** / Consult your health care provider. 1 dose of Td every 10 years.  Varicella vaccine.** / Consult your health care provider.  HPV vaccine. / 3 doses over 6 months, if 72 or younger.  Measles, mumps, rubella (MMR) vaccine.** / You need at least 1 dose of MMR if you were born in 1957 or later. You may also need a second dose.  Pneumococcal 13-valent conjugate (PCV13) vaccine.** / Consult your health care provider.  Pneumococcal polysaccharide (PPSV23) vaccine.** / 1 to 2 doses if you smoke cigarettes or if you have certain conditions.  Meningococcal vaccine.** / 1 dose if you are age 35 to 60 years and a Market researcher living in a residence hall, or have one of several medical conditions. You may also need additional booster doses.  Hepatitis A vaccine.** / Consult your health care provider.  Hepatitis B vaccine.** / Consult your health care provider.  Haemophilus influenzae type b (Hib) vaccine.** / Consult your health care provider. Ages 35 to 8  Blood pressure check.** / Every 1 to 2 years.  Lipid and cholesterol check.** / Every 5 years beginning at age 57.  Lung cancer screening. / Every year if you are aged 44-80 years and have a 30-pack-year history of smoking and currently smoke or have quit within the past 15 years. Yearly screening is stopped once you have quit smoking for at least 15 years or develop a health problem that would prevent you from having lung cancer treatment.  Fecal occult blood test (FOBT) of stool. / Every year beginning at age 55 and continuing until age 73. You may not have to do this test if you get a colonoscopy every 10 years.  Flexible sigmoidoscopy** or colonoscopy.** / Every 5 years for a flexible sigmoidoscopy or every 10 years for a colonoscopy beginning at age 28 and continuing until age  1.  Hepatitis C blood test.** / For all people born from [birth year] through 1965 and any individual with known risks for hepatitis C.  Skin self-exam. / Monthly.  Influenza vaccine. / Every  year.  Tetanus, diphtheria, and acellular pertussis (Tdap/Td) vaccine.** / Consult your health care provider. 1 dose of Td every 10 years.  Varicella vaccine.** / Consult your health care provider.  Zoster vaccine.** / 1 dose for adults aged 60 years or older.  Measles, mumps, rubella (MMR) vaccine.** / You need at least 1 dose of MMR if you were born in 1957 or later. You may also need a second dose.  Pneumococcal 13-valent conjugate (PCV13) vaccine.** / Consult your health care provider.  Pneumococcal polysaccharide (PPSV23) vaccine.** / 1 to 2 doses if you smoke cigarettes or if you have certain conditions.  Meningococcal vaccine.** / Consult your health care provider.  Hepatitis A vaccine.** / Consult your health care provider.  Hepatitis B vaccine.** / Consult your health care provider.  Haemophilus influenzae type b (Hib) vaccine.** / Consult your health care provider. Ages 65 and over  Blood pressure check.** / Every 1 to 2 years.  Lipid and cholesterol check.**/ Every 5 years beginning at age 20.  Lung cancer screening. / Every year if you are aged 55-80 years and have a 30-pack-year history of smoking and currently smoke or have quit within the past 15 years. Yearly screening is stopped once you have quit smoking for at least 15 years or develop a health problem that would prevent you from having lung cancer treatment.  Fecal occult blood test (FOBT) of stool. / Every year beginning at age 50 and continuing until age 75. You may not have to do this test if you get a colonoscopy every 10 years.  Flexible sigmoidoscopy** or colonoscopy.** / Every 5 years for a flexible sigmoidoscopy or every 10 years for a colonoscopy beginning at age 50 and continuing until age 75.  Hepatitis C blood  test.** / For all people born from 1945 through 1965 and any individual with known risks for hepatitis C.  Abdominal aortic aneurysm (AAA) screening./ Screening current or former smokers or have Hypertension.  Skin self-exam. / Monthly.  Influenza vaccine. / Every year.  Tetanus, diphtheria, and acellular pertussis (Tdap/Td) vaccine.** / 1 dose of Td every 10 years.  Varicella vaccine.** / Consult your health care provider.  Zoster vaccine.** / 1 dose for adults aged 60 years or older.  Pneumococcal 13-valent conjugate (PCV13) vaccine.** / Consult your health care provider.  Pneumococcal polysaccharide (PPSV23) vaccine.** / 1 dose for all adults aged 65 years and older.  Meningococcal vaccine.** / Consult your health care provider.  Hepatitis A vaccine.** / Consult your health care provider.  Hepatitis B vaccine.** / Consult your health care provider.  Haemophilus influenzae type b (Hib) vaccine.** / Consult your health care provider.  Health Maintenance A healthy lifestyle and preventative care can promote health and wellness. Maintain regular health, dental, and eye exams. Eat a healthy diet. Foods like vegetables, fruits, whole grains, low-fat dairy products, and lean protein foods contain the nutrients you need and are low in calories. Decrease your intake of foods high in solid fats, added sugars, and salt. Get information about a proper diet from your health care provider, if necessary. Regular physical exercise is one of the most important things you can do for your health. Most adults should get at least 150 minutes of moderate-intensity exercise (any activity that increases your heart rate and causes you to sweat) each week. In addition, most adults need muscle-strengthening exercises on 2 or more days a week.  Maintain a healthy weight. The body mass index (BMI) is a screening tool to identify   possible weight problems. It provides an estimate of body fat based on height and  weight. Your health care provider can find your BMI and can help you achieve or maintain a healthy weight. For males 20 years and older: A BMI below 18.5 is considered underweight. A BMI of 18.5 to 24.9 is normal. A BMI of 25 to 29.9 is considered overweight. A BMI of 30 and above is considered obese. Maintain normal blood lipids and cholesterol by exercising and minimizing your intake of saturated fat. Eat a balanced diet with plenty of fruits and vegetables. Blood tests for lipids and cholesterol should begin at age 20 and be repeated every 5 years. If your lipid or cholesterol levels are high, you are over age 50, or you are at high risk for heart disease, you may need your cholesterol levels checked more frequently.Ongoing high lipid and cholesterol levels should be treated with medicines if diet and exercise are not working. If you smoke, find out from your health care provider how to quit. If you do not use tobacco, do not start. Lung cancer screening is recommended for adults aged 55-80 years who are at high risk for developing lung cancer because of a history of smoking. A yearly low-dose CT scan of the lungs is recommended for people who have at least a 30-pack-year history of smoking and are current smokers or have quit within the past 15 years. A pack year of smoking is smoking an average of 1 pack of cigarettes a day for 1 year (for example, a 30-pack-year history of smoking could mean smoking 1 pack a day for 30 years or 2 packs a day for 15 years). Yearly screening should continue until the smoker has stopped smoking for at least 15 years. Yearly screening should be stopped for people who develop a health problem that would prevent them from having lung cancer treatment. If you choose to drink alcohol, do not have more than 2 drinks per day. One drink is considered to be 12 oz (360 mL) of beer, 5 oz (150 mL) of wine, or 1.5 oz (45 mL) of liquor. Avoid the use of street drugs. Do not share  needles with anyone. Ask for help if you need support or instructions about stopping the use of drugs. High blood pressure causes heart disease and increases the risk of stroke. Blood pressure should be checked at least every 1-2 years. Ongoing high blood pressure should be treated with medicines if weight loss and exercise are not effective. If you are 45-79 years old, ask your health care provider if you should take aspirin to prevent heart disease. Diabetes screening involves taking a blood sample to check your fasting blood sugar level. This should be done once every 3 years after age 45 if you are at a normal weight and without risk factors for diabetes. Testing should be considered at a younger age or be carried out more frequently if you are overweight and have at least 1 risk factor for diabetes. Colorectal cancer can be detected and often prevented. Most routine colorectal cancer screening begins at the age of 50 and continues through age 75. However, your health care provider may recommend screening at an earlier age if you have risk factors for colon cancer. On a yearly basis, your health care provider may provide home test kits to check for hidden blood in the stool. A small camera at the end of a tube may be used to directly examine the colon (sigmoidoscopy or colonoscopy)   to detect the earliest forms of colorectal cancer. Talk to your health care provider about this at age 50 when routine screening begins. A direct exam of the colon should be repeated every 5-10 years through age 75, unless early forms of precancerous polyps or small growths are found. People who are at an increased risk for hepatitis B should be screened for this virus. You are considered at high risk for hepatitis B if: You were born in a country where hepatitis B occurs often. Talk with your health care provider about which countries are considered high risk. Your parents were born in a high-risk country and you have not  received a shot to protect against hepatitis B (hepatitis B vaccine). You have HIV or AIDS. You use needles to inject street drugs. You live with, or have sex with, someone who has hepatitis B. You are a man who has sex with other men (MSM). You get hemodialysis treatment. You take certain medicines for conditions like cancer, organ transplantation, and autoimmune conditions. Hepatitis C blood testing is recommended for all people born from 1945 through 1965 and any individual with known risk factors for hepatitis C. Healthy men should no longer receive prostate-specific antigen (PSA) blood tests as part of routine cancer screening. Talk to your health care provider about prostate cancer screening. Testicular cancer screening is not recommended for adolescents or adult males who have no symptoms. Screening includes self-exam, a health care provider exam, and other screening tests. Consult with your health care provider about any symptoms you have or any concerns you have about testicular cancer. Practice safe sex. Use condoms and avoid high-risk sexual practices to reduce the spread of sexually transmitted infections (STIs). You should be screened for STIs, including gonorrhea and chlamydia if: You are sexually active and are younger than 24 years. You are older than 24 years, and your health care provider tells you that you are at risk for this type of infection. Your sexual activity has changed since you were last screened, and you are at an increased risk for chlamydia or gonorrhea. Ask your health care provider if you are at risk. If you are at risk of being infected with HIV, it is recommended that you take a prescription medicine daily to prevent HIV infection. This is called pre-exposure prophylaxis (PrEP). You are considered at risk if: You are a man who has sex with other men (MSM). You are a heterosexual man who is sexually active with multiple partners. You take drugs by injection. You  are sexually active with a partner who has HIV. Talk with your health care provider about whether you are at high risk of being infected with HIV. If you choose to begin PrEP, you should first be tested for HIV. You should then be tested every 3 months for as long as you are taking PrEP. Use sunscreen. Apply sunscreen liberally and repeatedly throughout the day. You should seek shade when your shadow is shorter than you. Protect yourself by wearing long sleeves, pants, a wide-brimmed hat, and sunglasses year round whenever you are outdoors. Tell your health care provider of new moles or changes in moles, especially if there is a change in shape or color. Also, tell your health care provider if a mole is larger than the size of a pencil eraser. Stay current with your vaccines (immunizations).   

## 2016-06-17 ENCOUNTER — Other Ambulatory Visit: Payer: Self-pay | Admitting: Internal Medicine

## 2016-06-17 DIAGNOSIS — E039 Hypothyroidism, unspecified: Secondary | ICD-10-CM

## 2016-06-17 LAB — INSULIN, RANDOM: Insulin: 13.1 u[IU]/mL (ref 2.0–19.6)

## 2016-06-17 LAB — VITAMIN D 25 HYDROXY (VIT D DEFICIENCY, FRACTURES): Vit D, 25-Hydroxy: 72 ng/mL (ref 30–100)

## 2016-06-23 DIAGNOSIS — Z79891 Long term (current) use of opiate analgesic: Secondary | ICD-10-CM | POA: Diagnosis not present

## 2016-06-23 DIAGNOSIS — G894 Chronic pain syndrome: Secondary | ICD-10-CM | POA: Diagnosis not present

## 2016-06-23 DIAGNOSIS — M40204 Unspecified kyphosis, thoracic region: Secondary | ICD-10-CM | POA: Diagnosis not present

## 2016-06-23 DIAGNOSIS — M546 Pain in thoracic spine: Secondary | ICD-10-CM | POA: Diagnosis not present

## 2016-07-10 ENCOUNTER — Other Ambulatory Visit: Payer: Self-pay | Admitting: Physician Assistant

## 2016-07-10 MED ORDER — TESTOSTERONE 10 MG/ACT (2%) TD GEL: TRANSDERMAL | 1 refills | Status: DC

## 2016-07-10 NOTE — Progress Notes (Signed)
Testosterone was called into pharmacy

## 2016-07-11 DIAGNOSIS — H26053 Posterior subcapsular polar infantile and juvenile cataract, bilateral: Secondary | ICD-10-CM | POA: Diagnosis not present

## 2016-07-11 DIAGNOSIS — H5203 Hypermetropia, bilateral: Secondary | ICD-10-CM | POA: Diagnosis not present

## 2016-07-11 DIAGNOSIS — H35363 Drusen (degenerative) of macula, bilateral: Secondary | ICD-10-CM | POA: Diagnosis not present

## 2016-07-11 LAB — HM DIABETES EYE EXAM

## 2016-08-18 DIAGNOSIS — Z79891 Long term (current) use of opiate analgesic: Secondary | ICD-10-CM | POA: Diagnosis not present

## 2016-08-18 DIAGNOSIS — M546 Pain in thoracic spine: Secondary | ICD-10-CM | POA: Diagnosis not present

## 2016-08-18 DIAGNOSIS — G894 Chronic pain syndrome: Secondary | ICD-10-CM | POA: Diagnosis not present

## 2016-08-18 DIAGNOSIS — M40204 Unspecified kyphosis, thoracic region: Secondary | ICD-10-CM | POA: Diagnosis not present

## 2016-09-06 ENCOUNTER — Encounter (HOSPITAL_BASED_OUTPATIENT_CLINIC_OR_DEPARTMENT_OTHER): Payer: Medicare Other | Attending: Surgery

## 2016-09-06 DIAGNOSIS — I890 Lymphedema, not elsewhere classified: Secondary | ICD-10-CM | POA: Diagnosis not present

## 2016-09-06 DIAGNOSIS — I872 Venous insufficiency (chronic) (peripheral): Secondary | ICD-10-CM | POA: Diagnosis not present

## 2016-09-06 DIAGNOSIS — Z96651 Presence of right artificial knee joint: Secondary | ICD-10-CM | POA: Insufficient documentation

## 2016-09-06 DIAGNOSIS — L97321 Non-pressure chronic ulcer of left ankle limited to breakdown of skin: Secondary | ICD-10-CM | POA: Diagnosis not present

## 2016-09-06 DIAGNOSIS — Z87891 Personal history of nicotine dependence: Secondary | ICD-10-CM | POA: Diagnosis not present

## 2016-09-06 DIAGNOSIS — I10 Essential (primary) hypertension: Secondary | ICD-10-CM | POA: Insufficient documentation

## 2016-09-06 DIAGNOSIS — E119 Type 2 diabetes mellitus without complications: Secondary | ICD-10-CM | POA: Diagnosis not present

## 2016-09-12 ENCOUNTER — Ambulatory Visit (HOSPITAL_COMMUNITY)
Admission: RE | Admit: 2016-09-12 | Discharge: 2016-09-12 | Disposition: A | Discharge status: Home / Self Care | Payer: Medicare Other | Source: Ambulatory Visit | Attending: Vascular Surgery | Admitting: Vascular Surgery

## 2016-09-12 ENCOUNTER — Other Ambulatory Visit: Payer: Self-pay | Admitting: Surgery

## 2016-09-12 DIAGNOSIS — L97321 Non-pressure chronic ulcer of left ankle limited to breakdown of skin: Secondary | ICD-10-CM | POA: Diagnosis not present

## 2016-09-13 ENCOUNTER — Encounter (HOSPITAL_BASED_OUTPATIENT_CLINIC_OR_DEPARTMENT_OTHER): Payer: Medicare Other | Attending: Surgery

## 2016-09-13 ENCOUNTER — Telehealth: Payer: Self-pay | Admitting: Vascular Surgery

## 2016-09-13 DIAGNOSIS — L97322 Non-pressure chronic ulcer of left ankle with fat layer exposed: Secondary | ICD-10-CM | POA: Diagnosis not present

## 2016-09-13 DIAGNOSIS — I872 Venous insufficiency (chronic) (peripheral): Secondary | ICD-10-CM | POA: Diagnosis not present

## 2016-09-13 DIAGNOSIS — E11622 Type 2 diabetes mellitus with other skin ulcer: Secondary | ICD-10-CM | POA: Insufficient documentation

## 2016-09-13 DIAGNOSIS — I10 Essential (primary) hypertension: Secondary | ICD-10-CM | POA: Diagnosis not present

## 2016-09-13 DIAGNOSIS — Z87891 Personal history of nicotine dependence: Secondary | ICD-10-CM | POA: Diagnosis not present

## 2016-09-13 DIAGNOSIS — I890 Lymphedema, not elsewhere classified: Secondary | ICD-10-CM | POA: Diagnosis not present

## 2016-09-13 NOTE — Telephone Encounter (Signed)
Per Dr.Early's note on vascular imaging report from 09/12/16 he indicated this patient needs a consult new patient appointment. I scheduled this patient to see Dr.Early on 10/10/16 at 3:15pm. I left a VM message for the patient and also mailed new patient paperwork. awt

## 2016-09-20 DIAGNOSIS — Z87891 Personal history of nicotine dependence: Secondary | ICD-10-CM | POA: Diagnosis not present

## 2016-09-20 DIAGNOSIS — L97322 Non-pressure chronic ulcer of left ankle with fat layer exposed: Secondary | ICD-10-CM | POA: Diagnosis not present

## 2016-09-20 DIAGNOSIS — E11622 Type 2 diabetes mellitus with other skin ulcer: Secondary | ICD-10-CM | POA: Diagnosis not present

## 2016-09-20 DIAGNOSIS — I10 Essential (primary) hypertension: Secondary | ICD-10-CM | POA: Diagnosis not present

## 2016-09-20 DIAGNOSIS — I890 Lymphedema, not elsewhere classified: Secondary | ICD-10-CM | POA: Diagnosis not present

## 2016-09-20 DIAGNOSIS — I872 Venous insufficiency (chronic) (peripheral): Secondary | ICD-10-CM | POA: Diagnosis not present

## 2016-09-24 NOTE — Progress Notes (Signed)
MEDICARE ANNUAL WELLNESS VISIT AND CPE  Assessment:    Essential hypertension - continue medications, DASH diet, exercise and monitor at home. Call if greater than 130/80.  -     CBC with Differential/Platelet -     Hepatic function panel -     BASIC METABOLIC PANEL WITH GFR  Chronic obstructive pulmonary disease, unspecified COPD type ([place]) Avoid triggers, symptoms stable at this time  Gastroesophageal reflux disease, esophagitis presence not specified Continue PPI/H2 blocker, diet discussed  Celiac disease Continue GI followup -     Iron and TIBC -     Ferritin  Hypothyroidism, unspecified type Has been on OTC medication that is not working, has been having  -     TSH  DDD (degenerative disc disease), lumbar RICE, NSAIDS, exercises given, if not better get xray and PT referral or ortho referral.   Arthritis RICE, NSAIDS, exercises given, if not better get xray and PT referral or ortho referral.   CKD (chronic kidney disease) stage 2, GFR 60-89 ml/min Increase fluids, avoid NSAIDS, monitor sugars, will monitor -     Hepatic function panel  Abnormal glucose Discussed general issues about diabetes pathophysiology and management., Educational material distributed., Suggested low cholesterol diet., Encouraged aerobic exercise., Discussed foot care., Reminded to get yearly retinal exam. -     Hemoglobin A1c  Vitamin D deficiency Continue supplement  Vitamin B12 deficiency Check B12, wants to go back to shots  Mixed hyperlipidemia -continue medications, check lipids, decrease fatty foods, increase activity.  -     Lipid panel  Left-sided low back pain with sciatica, sciatica laterality unspecified, unspecified chronicity Continue pain managament  Chronic pain syndrome Continue pain managament  Medication management -     Magnesium  Testosterone deficiency Hypogonadism- continue replacement therapy, check testosterone levels as needed.   Venous ulcer of  ankle, left ([place]) Has follow up with Early, continue wound care  Encounter for Medicare annual wellness exam  Over 40 minutes of exam, counseling, chart review and critical decision making was performed  Future Appointments Date Time Provider [place]  10/10/2016 3:15 PM Rosetta Posner, MD [facility]  12/27/2016 2:30 PM Unk Pinto, MD [facility] None  07/18/2017 9:00 AM Unk Pinto, MD [facility] None     Plan:   During the course of the visit the patient was educated and counseled about appropriate screening and preventive services including:    Pneumococcal vaccine  Prevnar 13   Influenza vaccine  Td vaccine  Screening electrocardiogram  Bone densitometry screening  Colorectal cancer screening  Diabetes screening  Glaucoma screening  Nutrition counseling   Advanced directives: requested   Subjective:  [name] Fernandez is a 76 y.o. male who presents for Medicare Annual Wellness Visit and follow up   His blood pressure has been controlled at home, today their BP is BP: 138/66  He does not workout due to pain/swelling in his legs. He denies chest pain, shortness of breath, dizziness.  He is not on cholesterol medication and denies myalgias. His cholesterol is at goal. The cholesterol last visit was:   Lab Results  Component Value Date   CHOL 186 06/16/2016   HDL 32 (L) 06/16/2016   LDLCALC 123 (H) 06/16/2016   TRIG 153 (H) 06/16/2016   CHOLHDL 5.8 (H) 06/16/2016    He has been working on diet and exercise for prediabetes, and denies paresthesia of the feet, polydipsia, polyuria and visual disturbances. Last A1C in the office was:  Lab Results  Component Value Date   HGBA1C 6.0 (H) 06/16/2016   Patient is on Vitamin D supplement.   Lab Results  Component Value Date   VD25OH 72 06/16/2016     BMI is Body mass index is 30.96 kg/m., he is working on diet and exercise. 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 Hardin Negus for pain management but he has been reducing his oxycodone due to swelling, he is on 60m daily. Has appointment to see Dr. EDonnetta Hutching Has had medial left nonhealing ulcer, has been getting wraps for swelling that is doing better. Following with wound center. He is on thyroid medication. His medication was changed last visit, was taken off LTyrosine x 3 months .   Lab Results  Component Value Date   TSH 9.63 (H) 06/16/2016  .  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 He states that the testosterone helps with his energy, libido, muscle mass. Lab Results  Component Value Date   TESTOSTERONE 336 06/16/2016     Medication Review: Current Outpatient Prescriptions on File Prior to Visit  Medication Sig Dispense Refill  . Calcium Carbonate-Vit D-Min (CALCIUM 1200 PO) Take by mouth daily.    . Cholecalciferol (VITAMIN D PO) Take 5,000 Int'l Units by mouth daily.    . Cyanocobalamin (VITAMIN B 12 PO) Take 2,500 mcg by mouth 2 (two) times daily.    .Marland KitchenFLUZONE HIGH-DOSE 0.5 ML SUSY TO BE ADMINISTERED BY PHARMACIST FOR IMMUNIZATION  0  . furosemide (LASIX) 40 MG tablet TAKE 1 TABLET BY MOUTH EVERY DAY 90 tablet 0  . MAGNESIUM PO Take 500 mg by mouth 2 (two) times daily.     . metolazone (ZAROXOLYN) 5 MG tablet Take 1/2 to 1 tablet daily or as directed for fluid & swelling 90 tablet 1  . MILK THISTLE PO Take 240 mg by mouth daily.    . Omega-3 Fatty Acids (OMEGA-3 FISH OIL PO) Take by mouth daily.    .Marland Kitchenomeprazole (PRILOSEC) 20 MG capsule Take 1 capsule (20 mg total) by mouth daily. 90 capsule 3  . OVER THE COUNTER MEDICATION Thyroid complex 2 tabs daily.    .Marland KitchenOVER THE COUNTER MEDICATION Kelp with Iodine 1 daily    . OVER THE COUNTER MEDICATION Tumeric Curcumin 500 mg 2 times  daily    . oxyCODONE (OXY IR/ROXICODONE) 5 MG immediate release tablet TAKE 1 TABLET EVERY 4 HOURS AS NEEDED  0  . PNEUMOVAX 23 25 MCG/0.5ML injection TO BE ADMINISTERED BY PHARMACIST FOR IMMUNIZATION  0  . Red Yeast Rice 600 MG CAPS Take 600 mg by mouth 3 (three) times daily.    . Sennosides (SENOKOT PO) Take by mouth as needed.    . terbinafine (LAMISIL) 1 % cream Apply 1 application topically daily. For 4 weeks 42 g 3  . Testosterone 10 MG/ACT (2%) GEL Place 2 ML/mg topically each day 120 g 1   No current facility-administered medications on file prior to visit.     Allergies: Allergies  Allergen Reactions  . Feraheme [Ferumoxytol] Other (See Comments)    Swelling of lips and tongue, could not talk 30 minutes after the infusion.  . Naprosyn [Naproxen] Swelling    Unsure if naprosyn caused this  reaction  . Levothyroxine     Lip swelling    Current Problems (verified) Patient Active Problem List   Diagnosis Date Noted  . CKD (chronic kidney disease) stage 2, 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/2015  . COPD (chronic obstructive pulmonary disease) ([place]) 12/08/2015  . Chronic pain syndrome 05/26/2015  . DDD (degenerative disc disease), lumbar 05/26/2015  . Medication management 05/26/2015  . 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 Lumbago with sciatica 08/06/2014  . Hyperlipidemia 08/20/2013  . Hypertension   . Abnormal glucose   . Vitamin D deficiency   . Arthritis   . Vitamin B12 deficiency   . Celiac disease 06/19/2012  . GERD (gastroesophageal reflux disease) 04/30/2012  . 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 Hypothyroidism 04/30/2012  . 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 DT 01/12/2011  . Influenza, High Dose Seasonal PF 05/20/2014, 05/26/2015  . Influenza-Unspecified 04/30/2013, 03/29/2016  . Pneumococcal Conjugate-13 05/20/2014  . Pneumococcal Polysaccharide-23  03/29/2016  . Pneumococcal-Unspecified 05/20/2008  . Zoster 07/15/2014   Tetanus: 2012  Pneumovax: 2017 Prevnar 13: 2015 Flu vaccine: 2017 Zostavax: 2016  Colonoscopy: 05/2012 Dr. Hilarie Fredrickson  EGD: 05/2012  Names of Other Physician/Practitioners you currently use: 1. [facility] here for primary care 2. Lindalou Hose, eye doctor, last visit 07/2016 Patient Care Team: Unk Pinto, MD as PCP - General (Internal Medicine) Nicholaus Bloom, MD as Consulting Physician (Anesthesiology) Jerene Bears, MD as Consulting Physician (Gastroenterology)  Surgical:  has a past surgical history that includes Joint replacement (04/2010); Back surgery; Tonsillectomy; Eye surgery; Hiatal hernia repair; Hernia repair (1950's); and Total knee revision (11/20/2011). Family His family history includes Esophageal cancer in his father. Social history  He reports that he quit smoking about 36 years ago. His smoking use included Cigarettes. He has a 20.00 pack-year smoking history. He has never used smokeless tobacco. He reports that he does not drink alcohol or use drugs.  MEDICARE WELLNESS OBJECTIVES: Physical activity: Current Exercise Habits: The patient does not participate in regular exercise at present, Exercise limited by: orthopedic condition(s) Cardiac risk factors: Cardiac Risk Factors include: advanced age (>32mn, >>67women);dyslipidemia;hypertension;male gender;sedentary lifestyle;obesity (BMI >30kg/m2) Depression/mood screen:   [facility]  Decreased Interest 0  Down, Depressed, Hopeless 0  PHQ - 2 Score 0  Altered sleeping -  Tired, decreased energy -  Change in appetite -  Feeling bad or failure about yourself  -  Trouble concentrating -  Moving slowly or fidgety/restless -  Suicidal thoughts -  PHQ-9 Score -    ADLs:  In your present state of health, do you have any difficulty performing the following activities: 09/25/2016  06/16/2016  Hearing? N N  Vision? N N  Difficulty concentrating or making decisions? N N  Walking or climbing stairs? N N  Dressing or bathing? N N  Doing errands, shopping? N N  Preparing Food and eating ? N -  Using the Toilet? N -  In the past six months, have you accidently leaked urine? N -  Do you have problems with loss of bowel control? N -  Managing your Medications? N -  Managing your Finances? N -  Housekeeping or managing your Housekeeping? N -  Some recent data might be hidden    Cognitive Testing  Alert? Yes  Normal Appearance?Yes  Oriented to person? Yes  Place? Yes   Time? Yes  Recall of three objects?  2/3  Can perform simple calculations? Yes  Displays appropriate judgment?Yes  Can read the correct time from a watch face?Yes  EOL planning: Does Patient Have a Medical Advance Directive?: Yes Type of Advance Directive: Healthcare Power of Attorney, Living will Does patient want to make changes to medical advance directive?: No - Patient declined  Review of Systems  Constitutional: Negative.   HENT: Negative for congestion, ear discharge, ear pain, hearing loss, nosebleeds, sore throat and tinnitus.   Eyes: Negative.   Respiratory: Negative for cough, hemoptysis, sputum production, shortness of breath, wheezing and stridor.   Cardiovascular: Positive for leg swelling. Negative for chest pain, palpitations, orthopnea, claudication and PND.  Gastrointestinal: Negative for abdominal pain, blood in stool, constipation, diarrhea, heartburn, melena, nausea and vomiting.  Genitourinary: Negative for dysuria, flank pain, frequency, hematuria and urgency.  Musculoskeletal: Positive for back pain, joint pain and myalgias. Negative for falls and neck pain.  Skin: Negative for itching and rash.  Neurological: Negative for dizziness, tingling, tremors, sensory change, speech change, focal weakness, seizures, loss of consciousness and headaches.  Endo/Heme/Allergies: Negative.    Psychiatric/Behavioral: Negative.      Objective:     Today's Vitals   09/25/16 1430  BP: 138/66  Pulse: 66  Resp: 14  Temp: 98.1 F (36.7 C)  SpO2: 98%  Weight: 191 lb 12.8 oz (87 kg)  Height: 5' 6"  (1.676 m)  PainSc: 4   PainLoc: Leg   Body mass index is 30.96 kg/m.  General appearance: alert, no distress, WD/WN, male HEENT: normocephalic, sclerae anicteric, TMs pearly, nares patent, no discharge or erythema, pharynx normal Oral cavity: MMM, no lesions Neck: supple, no lymphadenopathy, no thyromegaly, no masses Heart: RRR, normal S1, S2, no murmurs Lungs: CTA bilaterally, no wheezes, rhonchi, or rales Abdomen: +bs, soft, non tender, non distended, no masses, no hepatomegaly, no splenomegaly Musculoskeletal: nontender, no swelling, no obvious deformity Extremities: no edema, no cyanosis, no clubbing Pulses: 2+ symmetric, upper and lower extremities, normal cap refill Neurological: alert, oriented x 3, CN2-12 intact, strength normal upper extremities and lower extremities, sensation not tested due to legs being wrapped, DTRs 2+ throughout, no cerebellar signs, gait Normal and Antalgic Psychiatric: normal affect, behavior normal, pleasant   Medicare Attestation I have personally reviewed: The patient's medical and social history Their use of alcohol, tobacco or illicit drugs Their current medications and supplements The patient's functional ability including ADLs,fall risks, home safety risks, cognitive, and hearing and visual impairment Diet and physical activities Evidence for depression or mood disorders  The patient's weight, height, BMI, and visual acuity have been recorded in the chart.  I have made referrals, counseling, and provided education to the patient based on review of the above and I have provided the patient with a written personalized care plan for preventive services.     Vicie Mutters, PA-C   09/25/2016

## 2016-09-25 ENCOUNTER — Encounter: Payer: Self-pay | Admitting: Physician Assistant

## 2016-09-25 ENCOUNTER — Ambulatory Visit (INDEPENDENT_AMBULATORY_CARE_PROVIDER_SITE_OTHER): Payer: Medicare Other | Admitting: Physician Assistant

## 2016-09-25 VITALS — BP 138/66 | HR 66 | Temp 98.1°F | Resp 14 | Ht 66.0 in | Wt 191.8 lb

## 2016-09-25 DIAGNOSIS — M1990 Unspecified osteoarthritis, unspecified site: Secondary | ICD-10-CM | POA: Diagnosis not present

## 2016-09-25 DIAGNOSIS — K900 Celiac disease: Secondary | ICD-10-CM

## 2016-09-25 DIAGNOSIS — E782 Mixed hyperlipidemia: Secondary | ICD-10-CM | POA: Diagnosis not present

## 2016-09-25 DIAGNOSIS — K219 Gastro-esophageal reflux disease without esophagitis: Secondary | ICD-10-CM | POA: Diagnosis not present

## 2016-09-25 DIAGNOSIS — I10 Essential (primary) hypertension: Secondary | ICD-10-CM | POA: Diagnosis not present

## 2016-09-25 DIAGNOSIS — Z0001 Encounter for general adult medical examination with abnormal findings: Secondary | ICD-10-CM

## 2016-09-25 DIAGNOSIS — I83023 Varicose veins of left lower extremity with ulcer of ankle: Secondary | ICD-10-CM

## 2016-09-25 DIAGNOSIS — N182 Chronic kidney disease, stage 2 (mild): Secondary | ICD-10-CM

## 2016-09-25 DIAGNOSIS — G894 Chronic pain syndrome: Secondary | ICD-10-CM

## 2016-09-25 DIAGNOSIS — R7309 Other abnormal glucose: Secondary | ICD-10-CM

## 2016-09-25 DIAGNOSIS — Z79899 Other long term (current) drug therapy: Secondary | ICD-10-CM | POA: Diagnosis not present

## 2016-09-25 DIAGNOSIS — M5136 Other intervertebral disc degeneration, lumbar region: Secondary | ICD-10-CM | POA: Diagnosis not present

## 2016-09-25 DIAGNOSIS — E538 Deficiency of other specified B group vitamins: Secondary | ICD-10-CM

## 2016-09-25 DIAGNOSIS — L97329 Non-pressure chronic ulcer of left ankle with unspecified severity: Secondary | ICD-10-CM

## 2016-09-25 DIAGNOSIS — E039 Hypothyroidism, unspecified: Secondary | ICD-10-CM | POA: Diagnosis not present

## 2016-09-25 DIAGNOSIS — E559 Vitamin D deficiency, unspecified: Secondary | ICD-10-CM | POA: Diagnosis not present

## 2016-09-25 DIAGNOSIS — J449 Chronic obstructive pulmonary disease, unspecified: Secondary | ICD-10-CM | POA: Diagnosis not present

## 2016-09-25 DIAGNOSIS — M5442 Lumbago with sciatica, left side: Secondary | ICD-10-CM

## 2016-09-25 DIAGNOSIS — R6889 Other general symptoms and signs: Secondary | ICD-10-CM

## 2016-09-25 DIAGNOSIS — E349 Endocrine disorder, unspecified: Secondary | ICD-10-CM

## 2016-09-25 DIAGNOSIS — Z0000 Encounter for general adult medical examination without abnormal findings: Secondary | ICD-10-CM

## 2016-09-25 LAB — CBC WITH DIFFERENTIAL/PLATELET
BASOS ABS: 67 {cells}/uL (ref 0–200)
Basophils Relative: 1 %
EOS ABS: 268 {cells}/uL (ref 15–500)
Eosinophils Relative: 4 %
HEMATOCRIT: 34.2 % — AB (ref 38.5–50.0)
Hemoglobin: 10.7 g/dL — ABNORMAL LOW (ref 13.2–17.1)
Lymphocytes Relative: 37 %
Lymphs Abs: 2479 cells/uL (ref 850–3900)
MCH: 26 pg — AB (ref 27.0–33.0)
MCHC: 31.3 g/dL — ABNORMAL LOW (ref 32.0–36.0)
MCV: 83.2 fL (ref 80.0–100.0)
MONOS PCT: 9 %
MPV: 9.5 fL (ref 7.5–12.5)
Monocytes Absolute: 603 cells/uL (ref 200–950)
NEUTROS ABS: 3283 {cells}/uL (ref 1500–7800)
Neutrophils Relative %: 49 %
PLATELETS: 424 10*3/uL — AB (ref 140–400)
RBC: 4.11 MIL/uL — ABNORMAL LOW (ref 4.20–5.80)
RDW: 19 % — ABNORMAL HIGH (ref 11.0–15.0)
WBC: 6.7 10*3/uL (ref 3.8–10.8)

## 2016-09-25 LAB — HEPATIC FUNCTION PANEL
ALBUMIN: 4 g/dL (ref 3.6–5.1)
ALK PHOS: 58 U/L (ref 40–115)
ALT: 26 U/L (ref 9–46)
AST: 28 U/L (ref 10–35)
Bilirubin, Direct: 0.2 mg/dL (ref <=0.2)
Indirect Bilirubin: 0.7 mg/dL (ref 0.2–1.2)
TOTAL PROTEIN: 7.4 g/dL (ref 6.1–8.1)
Total Bilirubin: 0.9 mg/dL (ref 0.2–1.2)

## 2016-09-25 LAB — BASIC METABOLIC PANEL WITH GFR
BUN: 13 mg/dL (ref 7–25)
CALCIUM: 9.5 mg/dL (ref 8.6–10.3)
CO2: 28 mmol/L (ref 20–31)
CREATININE: 0.96 mg/dL (ref 0.70–1.18)
Chloride: 103 mmol/L (ref 98–110)
GFR, EST AFRICAN AMERICAN: 89 mL/min (ref >=60)
GFR, Est Non African American: 77 mL/min (ref >=60)
Glucose, Bld: 86 mg/dL (ref 65–99)
POTASSIUM: 4.7 mmol/L (ref 3.5–5.3)
Sodium: 141 mmol/L (ref 135–146)

## 2016-09-25 LAB — LIPID PANEL
CHOLESTEROL: 203 mg/dL — AB (ref <200)
HDL: 27 mg/dL — AB (ref >40)
LDL CALC: 140 mg/dL — AB (ref <100)
TRIGLYCERIDES: 179 mg/dL — AB (ref <150)
Total CHOL/HDL Ratio: 7.5 Ratio — ABNORMAL HIGH (ref <5.0)
VLDL: 36 mg/dL — ABNORMAL HIGH (ref <30)

## 2016-09-25 LAB — IRON AND TIBC
%SAT: 13 % — AB (ref 15–60)
IRON: 60 ug/dL (ref 50–180)
TIBC: 463 ug/dL — ABNORMAL HIGH (ref 250–425)
UIBC: 403 ug/dL — AB (ref 125–400)

## 2016-09-25 LAB — TSH: TSH: 3.81 m[IU]/L (ref 0.40–4.50)

## 2016-09-25 NOTE — Patient Instructions (Signed)
Fatigue Fatigue is feeling tired all of the time, a lack of energy, or a lack of motivation. Occasional or mild fatigue is often a normal response to activity or life in general. However, long-lasting (chronic) or extreme fatigue may indicate an underlying medical condition. Follow these instructions at home: Watch your fatigue for any changes. The following actions may help to lessen any discomfort you are feeling:  Talk to your health care provider about how much sleep you need each night. Try to get the required amount every night.  Take medicines only as directed by your health care provider.  Eat a healthy and nutritious diet. Ask your health care provider if you need help changing your diet.  Drink enough fluid to keep your urine clear or pale yellow.  Practice ways of relaxing, such as yoga, meditation, massage therapy, or acupuncture.  Exercise regularly.  Change situations that cause you stress. Try to keep your work and personal routine reasonable.  Do not abuse illegal drugs.  Limit alcohol intake to no more than 1 drink per day for nonpregnant women and 2 drinks per day for men. One drink equals 12 ounces of beer, 5 ounces of wine, or 1 ounces of hard liquor.  Take a multivitamin, if directed by your health care provider. Contact a health care provider if:  Your fatigue does not get better.  You have a fever.  You have unintentional weight loss or gain.  You have headaches.  You have difficulty:  Falling asleep.  Sleeping throughout the night.  You feel angry, guilty, anxious, or sad.  You are unable to have a bowel movement (constipation).  You skin is dry.  Your legs or another part of your body is swollen. Get help right away if:  You feel confused.  Your vision is blurry.  You feel faint or pass out.  You have a severe headache.  You have severe abdominal, pelvic, or back pain.  You have chest pain, shortness of breath, or an irregular or  fast heartbeat.  You are unable to urinate or you urinate less than normal.  You develop abnormal bleeding, such as bleeding from the rectum, vagina, nose, lungs, or nipples.  You vomit blood.  You have thoughts about harming yourself or committing suicide.  You are worried that you might harm someone else. This information is not intended to replace advice given to you by your health care provider. Make sure you discuss any questions you have with your health care provider. Document Released: 03/26/2007 Document Revised: 11/04/2015 Document Reviewed: 09/30/2013 Elsevier Interactive Patient Education  2017 [employer].

## 2016-09-26 LAB — FERRITIN: FERRITIN: 13 ng/mL — AB (ref 20–380)

## 2016-09-26 LAB — MAGNESIUM: MAGNESIUM: 2 mg/dL (ref 1.5–2.5)

## 2016-09-26 LAB — HEMOGLOBIN A1C
Hgb A1c MFr Bld: 6 % — ABNORMAL HIGH (ref <5.7)
Mean Plasma Glucose: 126 mg/dL

## 2016-09-26 LAB — VITAMIN B12: Vitamin B-12: 1209 pg/mL — ABNORMAL HIGH (ref 200–1100)

## 2016-09-27 DIAGNOSIS — I872 Venous insufficiency (chronic) (peripheral): Secondary | ICD-10-CM | POA: Diagnosis not present

## 2016-09-27 DIAGNOSIS — I10 Essential (primary) hypertension: Secondary | ICD-10-CM | POA: Diagnosis not present

## 2016-09-27 DIAGNOSIS — L97322 Non-pressure chronic ulcer of left ankle with fat layer exposed: Secondary | ICD-10-CM | POA: Diagnosis not present

## 2016-09-27 DIAGNOSIS — Z87891 Personal history of nicotine dependence: Secondary | ICD-10-CM | POA: Diagnosis not present

## 2016-09-27 DIAGNOSIS — E11622 Type 2 diabetes mellitus with other skin ulcer: Secondary | ICD-10-CM | POA: Diagnosis not present

## 2016-09-27 DIAGNOSIS — I890 Lymphedema, not elsewhere classified: Secondary | ICD-10-CM | POA: Diagnosis not present

## 2016-10-03 ENCOUNTER — Encounter: Payer: Self-pay | Admitting: Vascular Surgery

## 2016-10-03 DIAGNOSIS — L97322 Non-pressure chronic ulcer of left ankle with fat layer exposed: Secondary | ICD-10-CM | POA: Diagnosis not present

## 2016-10-03 DIAGNOSIS — E11622 Type 2 diabetes mellitus with other skin ulcer: Secondary | ICD-10-CM | POA: Diagnosis not present

## 2016-10-03 DIAGNOSIS — L97321 Non-pressure chronic ulcer of left ankle limited to breakdown of skin: Secondary | ICD-10-CM | POA: Diagnosis not present

## 2016-10-03 DIAGNOSIS — I10 Essential (primary) hypertension: Secondary | ICD-10-CM | POA: Diagnosis not present

## 2016-10-03 DIAGNOSIS — I872 Venous insufficiency (chronic) (peripheral): Secondary | ICD-10-CM | POA: Diagnosis not present

## 2016-10-03 DIAGNOSIS — Z87891 Personal history of nicotine dependence: Secondary | ICD-10-CM | POA: Diagnosis not present

## 2016-10-03 DIAGNOSIS — I890 Lymphedema, not elsewhere classified: Secondary | ICD-10-CM | POA: Diagnosis not present

## 2016-10-10 ENCOUNTER — Ambulatory Visit (INDEPENDENT_AMBULATORY_CARE_PROVIDER_SITE_OTHER): Payer: Medicare Other | Admitting: Vascular Surgery

## 2016-10-10 ENCOUNTER — Encounter: Payer: Self-pay | Admitting: Vascular Surgery

## 2016-10-10 VITALS — BP 120/70 | HR 72 | Temp 98.6°F | Resp 98 | Ht 66.0 in | Wt 188.0 lb

## 2016-10-10 DIAGNOSIS — L97329 Non-pressure chronic ulcer of left ankle with unspecified severity: Secondary | ICD-10-CM

## 2016-10-10 DIAGNOSIS — I83023 Varicose veins of left lower extremity with ulcer of ankle: Secondary | ICD-10-CM

## 2016-10-10 NOTE — Progress Notes (Signed)
Vascular and Vein Specialist of [place]  Patient name: [name] Fernandez MRN: [id number] DOB: 28-Feb-1941 Sex: male  REASON FOR CONSULT: Venous ulceration left leg  HPI: [name] Fernandez is a 76 y.o. 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 He is a very pleasant gentleman who said the venous ulcer for approximate 6 months. He has had intensive treatment at the wound center for several months and has had very slow healing of this. He had an outpatient vascular lab in our office showing substantial venous hypertension that may be correctable to allow healing of this. He does not have any history of arterial insufficiency. No history of DVT and no history of bleeding from his veins. 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  Past Medical History:  Diagnosis Date  . Anemia   . Arthritis   . Celiac disease    diagnoses 06/24/12  . Esophageal stricture   . H/O hiatal hernia   . Hiatal hernia   . Hypertension   . Hypothyroidism   . Prediabetes   . Shortness of breath    from oxycodone at times  . Vitamin B12 deficiency   . Vitamin D deficiency     Family History  Problem Relation Age of Onset  . Esophageal cancer Father     SOCIAL HISTORY: Social History   Social History  . Marital status: Married    Spouse name: N/A  . Number of children: 2  . Years of education: N/A   Occupational History  . Retired [employer] Service [employer]   Social History Main Topics  . Smoking status: Former Smoker    Packs/day: 1.00    Years: 20.00    Types: Cigarettes    Quit date: 06/27/1980  . Smokeless tobacco: Never Used  . Alcohol use No  . Drug use: No  . Sexual activity: Not on file   Other Topics Concern  . Not on file   Social History Narrative  . No narrative on file    Allergies  Allergen Reactions  . Feraheme [Ferumoxytol] Other (See Comments)    Swelling of lips and  tongue, could not talk 30 minutes after the infusion.  . Naprosyn [Naproxen] Swelling    Unsure if naprosyn caused this reaction  . Latex   . Levothyroxine     Lip swelling    Current Outpatient Prescriptions  Medication Sig Dispense Refill  . Calcium Carbonate-Vit D-Min (CALCIUM 1200 PO) Take by mouth daily.    . Cholecalciferol (VITAMIN D PO) Take 5,000 Int'l Units by mouth daily.    . Cyanocobalamin (VITAMIN B 12 PO) Take 2,500 mcg by mouth 2 (two) times daily.    Marland Kitchen FLUZONE HIGH-DOSE 0.5 ML SUSY TO BE ADMINISTERED BY PHARMACIST FOR IMMUNIZATION  0  . furosemide (LASIX) 40 MG tablet TAKE 1 TABLET BY MOUTH EVERY DAY 90 tablet 0  . MAGNESIUM PO Take 500 mg by mouth 2 (two) times daily.     . metolazone (ZAROXOLYN) 5 MG tablet Take 1/2 to 1 tablet daily or as directed for fluid & swelling 90 tablet 1  . MILK THISTLE PO Take 240 mg by mouth daily.    . Omega-3 Fatty Acids (OMEGA-3 FISH OIL PO) Take by mouth daily.    Marland Kitchen omeprazole (PRILOSEC) 20 MG capsule Take 1 capsule (20 mg total) by mouth daily. 90 capsule 3  . OVER THE COUNTER MEDICATION Thyroid  complex 2 tabs daily.    Marland Kitchen OVER THE COUNTER MEDICATION Kelp with Iodine 1 daily    . OVER THE COUNTER MEDICATION Tumeric Curcumin 500 mg 2 times daily    . oxyCODONE (OXY IR/ROXICODONE) 5 MG immediate release tablet TAKE 1 TABLET EVERY 4 HOURS AS NEEDED  0  . PNEUMOVAX 23 25 MCG/0.5ML injection TO BE ADMINISTERED BY PHARMACIST FOR IMMUNIZATION  0  . Red Yeast Rice 600 MG CAPS Take 600 mg by mouth 3 (three) times daily.    . Sennosides (SENOKOT PO) Take by mouth as needed.    . terbinafine (LAMISIL) 1 % cream Apply 1 application topically daily. For 4 weeks 42 g 3  . Testosterone 10 MG/ACT (2%) GEL Place 2 ML/mg topically each day 120 g 1   No current facility-administered medications for this visit.     REVIEW OF SYSTEMS:  [X]  denotes positive finding, [ ]  denotes negative finding Cardiac  Comments:  Chest pain or chest pressure:      Shortness of breath upon exertion:    Short of breath when lying flat:    Irregular heart rhythm:        Vascular    Pain in calf, thigh, or hip brought on by ambulation:    Pain in feet at night that wakes you up from your sleep:     Blood clot in your veins:    Leg swelling:  x       Pulmonary    Oxygen at home:    Productive cough:     Wheezing:         Neurologic    Sudden weakness in arms or legs:     Sudden numbness in arms or legs:     Sudden onset of difficulty speaking or slurred speech:    Temporary loss of vision in one eye:     Problems with dizziness:         Gastrointestinal    Blood in stool:     Vomited blood:         Genitourinary    Burning when urinating:     Blood in urine:        Psychiatric    Major depression:         Hematologic    Bleeding problems:    Problems with blood clotting too easily:        Skin    Rashes or ulcers: x       Constitutional    Fever or chills:      PHYSICAL EXAM: Vitals:   10/10/16 1507  BP: 120/70  Pulse: 72  Resp: (!) 98  Temp: 98.6 F (37 C)  TempSrc: Oral  Weight: 188 lb (85.3 kg)  Height: 5' 6"  (1.676 m)    GENERAL: The patient is a well-nourished male, in no acute distress. The vital signs are documented above. CARDIOVASCULAR: 2+ radial and 2+ dorsalis pedis pulses bilaterally PULMONARY: There is good air exchange  ABDOMEN: Soft and non-tender  MUSCULOSKELETAL: There are no major deformities or cyanosis. NEUROLOGIC: No focal weakness or paresthesias are detected. SKIN: Marked changes of venous hypertension around his ankle only left with a 3 cm venous ulcer below the level of the medial malleolus. PSYCHIATRIC: The patient has a normal affect.  DATA:  Extensive superficial and deep venous reflux in the left leg with marked dilatation of his great and small saphenous vein with reflux on the left  MEDICAL ISSUES: Had long discussion with  the patient and his wife present. Have recommended staged  laser ablation of his great and then small saphenous vein for reduction of his venous hypertension. I did image his right leg with SonoSite he does have dilatation of the saphenous vein and the right as well. He does have some varicosities which are not bothering him and no skin changes I would recommend observation only of this. He understands and wished to proceed as soon as possible. We'll continue local wound care at the wound center until his wound is completely healed   Rosetta Posner, MD [facility] Vascular and Vein Specialists of [facility] [phone] Pager [phone]

## 2016-10-11 ENCOUNTER — Other Ambulatory Visit: Payer: Self-pay | Admitting: *Deleted

## 2016-10-11 ENCOUNTER — Encounter (HOSPITAL_BASED_OUTPATIENT_CLINIC_OR_DEPARTMENT_OTHER): Payer: Medicare Other | Attending: Surgery

## 2016-10-11 DIAGNOSIS — M545 Low back pain: Secondary | ICD-10-CM | POA: Insufficient documentation

## 2016-10-11 DIAGNOSIS — I872 Venous insufficiency (chronic) (peripheral): Secondary | ICD-10-CM | POA: Diagnosis not present

## 2016-10-11 DIAGNOSIS — L97322 Non-pressure chronic ulcer of left ankle with fat layer exposed: Secondary | ICD-10-CM | POA: Diagnosis not present

## 2016-10-11 DIAGNOSIS — G8929 Other chronic pain: Secondary | ICD-10-CM | POA: Diagnosis not present

## 2016-10-11 DIAGNOSIS — Z87891 Personal history of nicotine dependence: Secondary | ICD-10-CM | POA: Insufficient documentation

## 2016-10-11 DIAGNOSIS — E11622 Type 2 diabetes mellitus with other skin ulcer: Secondary | ICD-10-CM | POA: Insufficient documentation

## 2016-10-11 DIAGNOSIS — L97309 Non-pressure chronic ulcer of unspecified ankle with unspecified severity: Principal | ICD-10-CM

## 2016-10-11 DIAGNOSIS — L97329 Non-pressure chronic ulcer of left ankle with unspecified severity: Secondary | ICD-10-CM

## 2016-10-11 DIAGNOSIS — I10 Essential (primary) hypertension: Secondary | ICD-10-CM | POA: Insufficient documentation

## 2016-10-11 DIAGNOSIS — I890 Lymphedema, not elsewhere classified: Secondary | ICD-10-CM | POA: Insufficient documentation

## 2016-10-11 DIAGNOSIS — I83023 Varicose veins of left lower extremity with ulcer of ankle: Secondary | ICD-10-CM

## 2016-10-11 DIAGNOSIS — E785 Hyperlipidemia, unspecified: Secondary | ICD-10-CM | POA: Insufficient documentation

## 2016-10-13 DIAGNOSIS — L97922 Non-pressure chronic ulcer of unspecified part of left lower leg with fat layer exposed: Secondary | ICD-10-CM | POA: Diagnosis not present

## 2016-10-13 DIAGNOSIS — M40204 Unspecified kyphosis, thoracic region: Secondary | ICD-10-CM | POA: Diagnosis not present

## 2016-10-13 DIAGNOSIS — G894 Chronic pain syndrome: Secondary | ICD-10-CM | POA: Diagnosis not present

## 2016-10-13 DIAGNOSIS — Z79891 Long term (current) use of opiate analgesic: Secondary | ICD-10-CM | POA: Diagnosis not present

## 2016-10-13 DIAGNOSIS — M546 Pain in thoracic spine: Secondary | ICD-10-CM | POA: Diagnosis not present

## 2016-10-16 ENCOUNTER — Other Ambulatory Visit: Payer: Self-pay | Admitting: Internal Medicine

## 2016-10-18 DIAGNOSIS — I87312 Chronic venous hypertension (idiopathic) with ulcer of left lower extremity: Secondary | ICD-10-CM | POA: Diagnosis not present

## 2016-10-18 DIAGNOSIS — E785 Hyperlipidemia, unspecified: Secondary | ICD-10-CM | POA: Diagnosis not present

## 2016-10-18 DIAGNOSIS — L97321 Non-pressure chronic ulcer of left ankle limited to breakdown of skin: Secondary | ICD-10-CM | POA: Diagnosis not present

## 2016-10-18 DIAGNOSIS — E11622 Type 2 diabetes mellitus with other skin ulcer: Secondary | ICD-10-CM | POA: Diagnosis not present

## 2016-10-18 DIAGNOSIS — L97322 Non-pressure chronic ulcer of left ankle with fat layer exposed: Secondary | ICD-10-CM | POA: Diagnosis not present

## 2016-10-18 DIAGNOSIS — Z87891 Personal history of nicotine dependence: Secondary | ICD-10-CM | POA: Diagnosis not present

## 2016-10-18 DIAGNOSIS — I890 Lymphedema, not elsewhere classified: Secondary | ICD-10-CM | POA: Diagnosis not present

## 2016-10-18 DIAGNOSIS — I872 Venous insufficiency (chronic) (peripheral): Secondary | ICD-10-CM | POA: Diagnosis not present

## 2016-10-25 DIAGNOSIS — L97322 Non-pressure chronic ulcer of left ankle with fat layer exposed: Secondary | ICD-10-CM | POA: Diagnosis not present

## 2016-10-25 DIAGNOSIS — I872 Venous insufficiency (chronic) (peripheral): Secondary | ICD-10-CM | POA: Diagnosis not present

## 2016-10-25 DIAGNOSIS — Z87891 Personal history of nicotine dependence: Secondary | ICD-10-CM | POA: Diagnosis not present

## 2016-10-25 DIAGNOSIS — E785 Hyperlipidemia, unspecified: Secondary | ICD-10-CM | POA: Diagnosis not present

## 2016-10-25 DIAGNOSIS — E11622 Type 2 diabetes mellitus with other skin ulcer: Secondary | ICD-10-CM | POA: Diagnosis not present

## 2016-10-25 DIAGNOSIS — I890 Lymphedema, not elsewhere classified: Secondary | ICD-10-CM | POA: Diagnosis not present

## 2016-10-27 ENCOUNTER — Encounter: Payer: Self-pay | Admitting: Vascular Surgery

## 2016-11-01 DIAGNOSIS — E785 Hyperlipidemia, unspecified: Secondary | ICD-10-CM | POA: Diagnosis not present

## 2016-11-01 DIAGNOSIS — I890 Lymphedema, not elsewhere classified: Secondary | ICD-10-CM | POA: Diagnosis not present

## 2016-11-01 DIAGNOSIS — L97321 Non-pressure chronic ulcer of left ankle limited to breakdown of skin: Secondary | ICD-10-CM | POA: Diagnosis not present

## 2016-11-01 DIAGNOSIS — Z87891 Personal history of nicotine dependence: Secondary | ICD-10-CM | POA: Diagnosis not present

## 2016-11-01 DIAGNOSIS — L97322 Non-pressure chronic ulcer of left ankle with fat layer exposed: Secondary | ICD-10-CM | POA: Diagnosis not present

## 2016-11-01 DIAGNOSIS — I872 Venous insufficiency (chronic) (peripheral): Secondary | ICD-10-CM | POA: Diagnosis not present

## 2016-11-01 DIAGNOSIS — E11622 Type 2 diabetes mellitus with other skin ulcer: Secondary | ICD-10-CM | POA: Diagnosis not present

## 2016-11-02 ENCOUNTER — Encounter: Payer: Self-pay | Admitting: Vascular Surgery

## 2016-11-02 ENCOUNTER — Ambulatory Visit (INDEPENDENT_AMBULATORY_CARE_PROVIDER_SITE_OTHER): Payer: Medicare Other | Admitting: Vascular Surgery

## 2016-11-02 VITALS — BP 131/76 | HR 72 | Temp 97.6°F | Resp 16 | Ht 66.5 in | Wt 175.0 lb

## 2016-11-02 DIAGNOSIS — L97329 Non-pressure chronic ulcer of left ankle with unspecified severity: Secondary | ICD-10-CM

## 2016-11-02 DIAGNOSIS — I868 Varicose veins of other specified sites: Secondary | ICD-10-CM

## 2016-11-02 DIAGNOSIS — I83023 Varicose veins of left lower extremity with ulcer of ankle: Secondary | ICD-10-CM | POA: Diagnosis not present

## 2016-11-02 NOTE — Progress Notes (Signed)
     Laser Ablation Procedure    Date: 11/02/2016   [name] Fernandez DOB:Dec 20, 1940  Consent signed: Yes    Surgeon:  Dr. Sherren Mocha Lachanda Buczek  Procedure: Laser Ablation: left Greater Saphenous Vein  BP 131/76   Pulse 72   Temp 97.6 F (36.4 C)   Resp 16   Ht 5' 6.5" (1.689 m)   Wt 175 lb (79.4 kg)   SpO2 100%   BMI 27.82 kg/m   Tumescent Anesthesia: 480 cc 0.9% NaCl with 50 cc Lidocaine HCL with 1% Epi and 15 cc 8.4% NaHCO3  Local Anesthesia: 3 cc Lidocaine HCL and NaHCO3 (ratio 2:1)  15 watts continuous mode        Total energy: 2610   Total time: 2:54    Patient tolerated procedure well  Notes:   Description of Procedure:  After marking the course of the secondary varicosities, the patient was placed on the operating table in the supine position, and the left leg was prepped and draped in sterile fashion.   Local anesthetic was administered and under ultrasound guidance the saphenous vein was accessed with a micro needle and guide wire; then the mirco puncture sheath was placed.  A guide wire was inserted saphenofemoral junction , followed by a 5 french sheath.  The position of the sheath and then the laser fiber below the junction was confirmed using the ultrasound.  Tumescent anesthesia was administered along the course of the saphenous vein using ultrasound guidance. The patient was placed in Trendelenburg position and protective laser glasses were placed on patient and staff, and the laser was fired at 15 watts continuous mode advancing 1-51m/second for a total of 2610 joules.     Steri strips were applied to the stab wounds and ABD pads and thigh high compression stockings were applied.  Ace wrap bandages were applied over the phlebectomy sites and at the top of the saphenofemoral junction. Blood loss was less than 15 cc.  The patient ambulated out of the operating room having tolerated the procedure well.  Uneventful ablation from mid calf to just below saphenofemoral  junction. Will return next week for ultrasound follow-up. Then will undergo small saphenous ablation in several weeks

## 2016-11-03 ENCOUNTER — Encounter: Payer: Self-pay | Admitting: Vascular Surgery

## 2016-11-08 DIAGNOSIS — E11622 Type 2 diabetes mellitus with other skin ulcer: Secondary | ICD-10-CM | POA: Diagnosis not present

## 2016-11-08 DIAGNOSIS — I872 Venous insufficiency (chronic) (peripheral): Secondary | ICD-10-CM | POA: Diagnosis not present

## 2016-11-08 DIAGNOSIS — Z87891 Personal history of nicotine dependence: Secondary | ICD-10-CM | POA: Diagnosis not present

## 2016-11-08 DIAGNOSIS — L97322 Non-pressure chronic ulcer of left ankle with fat layer exposed: Secondary | ICD-10-CM | POA: Diagnosis not present

## 2016-11-08 DIAGNOSIS — I890 Lymphedema, not elsewhere classified: Secondary | ICD-10-CM | POA: Diagnosis not present

## 2016-11-08 DIAGNOSIS — L97321 Non-pressure chronic ulcer of left ankle limited to breakdown of skin: Secondary | ICD-10-CM | POA: Diagnosis not present

## 2016-11-08 DIAGNOSIS — E785 Hyperlipidemia, unspecified: Secondary | ICD-10-CM | POA: Diagnosis not present

## 2016-11-09 ENCOUNTER — Encounter: Payer: Self-pay | Admitting: Vascular Surgery

## 2016-11-09 ENCOUNTER — Ambulatory Visit (INDEPENDENT_AMBULATORY_CARE_PROVIDER_SITE_OTHER): Payer: Medicare Other | Admitting: Vascular Surgery

## 2016-11-09 ENCOUNTER — Ambulatory Visit (HOSPITAL_COMMUNITY)
Admission: RE | Admit: 2016-11-09 | Discharge: 2016-11-09 | Disposition: A | Discharge status: Home / Self Care | Payer: Medicare Other | Source: Ambulatory Visit | Attending: Vascular Surgery | Admitting: Vascular Surgery

## 2016-11-09 VITALS — BP 135/73 | HR 61 | Temp 97.5°F | Resp 20 | Ht 66.5 in | Wt 182.9 lb

## 2016-11-09 DIAGNOSIS — L97309 Non-pressure chronic ulcer of unspecified ankle with unspecified severity: Secondary | ICD-10-CM | POA: Diagnosis not present

## 2016-11-09 DIAGNOSIS — L97329 Non-pressure chronic ulcer of left ankle with unspecified severity: Secondary | ICD-10-CM | POA: Diagnosis not present

## 2016-11-09 DIAGNOSIS — I83023 Varicose veins of left lower extremity with ulcer of ankle: Secondary | ICD-10-CM

## 2016-11-09 DIAGNOSIS — I824Z2 Acute embolism and thrombosis of unspecified deep veins of left distal lower extremity: Secondary | ICD-10-CM | POA: Diagnosis not present

## 2016-11-09 NOTE — Progress Notes (Signed)
Vascular and Vein Specialist of [place]  Patient name: [name] Fernandez MRN: [id number] DOB: [birth year] Sex: male  REASON FOR VISIT: One-week follow-up left great saphenous vein ablation  HPI: [name] Fernandez is a 76 y.o. male here for follow-up. He reports that he had no discomfort associated with the ablation site. I spent this is unusual and using there is some erythema but fortunately has had no discomfort. Interestingly he reports that after the tumescent anesthesia for the ablation he had relief of the discomfort that he had in his medial ankle ulcer. After the lidocaine effect had dissipated the pain returned in his ankle.  Past Medical History:  Diagnosis Date  . Anemia   . Arthritis   . Celiac disease    diagnoses 06/24/12  . Esophageal stricture   . H/O hiatal hernia   . Hiatal hernia   . Hypertension   . Hypothyroidism   . Prediabetes   . Shortness of breath    from oxycodone at times  . Vitamin B12 deficiency   . Vitamin D deficiency     Family History  Problem Relation Age of Onset  . Esophageal cancer Father     SOCIAL HISTORY: Social History  Substance Use Topics  . Smoking status: Former Smoker    Packs/day: 1.00    Years: 20.00    Types: Cigarettes    Quit date: 06/27/1980  . Smokeless tobacco: Never Used  . Alcohol use No    Allergies  Allergen Reactions  . Feraheme [Ferumoxytol] Other (See Comments)    Swelling of lips and tongue, could not talk 30 minutes after the infusion.  . Naprosyn [Naproxen] Swelling    Unsure if naprosyn caused this reaction  . Latex   . Levothyroxine     Lip swelling    Current Outpatient Prescriptions  Medication Sig Dispense Refill  . Calcium Carbonate-Vit D-Min (CALCIUM 1200 PO) Take by mouth daily.    . Cholecalciferol (VITAMIN D PO) Take 5,000 Int'l Units by mouth daily.    . Cyanocobalamin (VITAMIN B 12 PO) Take 2,500 mcg by mouth 2 (two) times daily.    Marland Kitchen FLUZONE  HIGH-DOSE 0.5 ML SUSY TO BE ADMINISTERED BY PHARMACIST FOR IMMUNIZATION  0  . furosemide (LASIX) 40 MG tablet TAKE 1 TABLET BY MOUTH EVERY DAY 90 tablet 0  . MAGNESIUM PO Take 500 mg by mouth 2 (two) times daily.     . metolazone (ZAROXOLYN) 5 MG tablet Take 1/2 to 1 tablet daily or as directed for fluid & swelling 90 tablet 1  . MILK THISTLE PO Take 240 mg by mouth daily.    . Omega-3 Fatty Acids (OMEGA-3 FISH OIL PO) Take by mouth daily.    Marland Kitchen omeprazole (PRILOSEC) 20 MG capsule Take 1 capsule (20 mg total) by mouth daily. 90 capsule 3  . OVER THE COUNTER MEDICATION Thyroid complex 2 tabs daily.    Marland Kitchen OVER THE COUNTER MEDICATION Kelp with Iodine 1 daily    . OVER THE COUNTER MEDICATION Tumeric Curcumin 500 mg 2 times daily    . oxyCODONE (OXY IR/ROXICODONE) 5 MG immediate release tablet TAKE 1 TABLET EVERY 4 HOURS AS NEEDED  0  . PNEUMOVAX 23 25 MCG/0.5ML injection TO BE ADMINISTERED BY PHARMACIST FOR IMMUNIZATION  0  . Red Yeast Rice 600 MG CAPS Take 600 mg by mouth 3 (three) times daily.    . Sennosides (SENOKOT PO) Take by mouth as needed.    . terbinafine (LAMISIL) 1 %  cream Apply 1 application topically daily. For 4 weeks 42 g 3  . Testosterone 10 MG/ACT (2%) GEL Place 2 ML/mg topically each day 120 g 1   No current facility-administered medications for this visit.     REVIEW OF SYSTEMS:  [X]  denotes positive finding, [ ]  denotes negative finding Cardiac  Comments:  Chest pain or chest pressure:    Shortness of breath upon exertion:    Short of breath when lying flat:    Irregular heart rhythm:        Vascular    Pain in calf, thigh, or hip brought on by ambulation:    Pain in feet at night that wakes you up from your sleep:     Blood clot in your veins:    Leg swelling:  x         PHYSICAL EXAM: Vitals:   11/09/16 1011  BP: 135/73  Pulse: 61  Resp: 20  Temp: 97.5 F (36.4 C)  TempSrc: Oral  SpO2: 98%  Weight: 182 lb 14.4 oz (83 kg)  Height: 5' 6.5" (1.689 m)     GENERAL: The patient is a well-nourished male, in no acute distress. The vital signs are documented above. PULMONARY: There is good air exchange  MUSCULOSKELETAL: There are no major deformities or cyanosis. NEUROLOGIC: No focal weakness or paresthesias are detected. SKIN: There are no ulcers or rashes noted.He does have an ulceration over the medial left heel and the dressing was not removed PSYCHIATRIC: The patient has a normal affect.  DATA:  Duplex shows closure of his left great saphenous vein from the mid calf to 1-1/2 cm from the saphenofemoral junction  MEDICAL ISSUES: Excellent Phil Michels result from great saphenous vein ablation. He does have dilatation and reflux in his small saphenous as well. Is scheduled for a left small saphenous vein ablation in one week.    Rosetta Posner, MD FACS Vascular and Vein Specialists of [facility] Tel [phone] Pager [phone]

## 2016-11-15 ENCOUNTER — Encounter (HOSPITAL_BASED_OUTPATIENT_CLINIC_OR_DEPARTMENT_OTHER): Payer: Medicare Other | Attending: Surgery

## 2016-11-15 DIAGNOSIS — Z87891 Personal history of nicotine dependence: Secondary | ICD-10-CM | POA: Diagnosis not present

## 2016-11-15 DIAGNOSIS — E11622 Type 2 diabetes mellitus with other skin ulcer: Secondary | ICD-10-CM | POA: Diagnosis not present

## 2016-11-15 DIAGNOSIS — I872 Venous insufficiency (chronic) (peripheral): Secondary | ICD-10-CM | POA: Insufficient documentation

## 2016-11-15 DIAGNOSIS — I890 Lymphedema, not elsewhere classified: Secondary | ICD-10-CM | POA: Diagnosis not present

## 2016-11-15 DIAGNOSIS — L97321 Non-pressure chronic ulcer of left ankle limited to breakdown of skin: Secondary | ICD-10-CM | POA: Insufficient documentation

## 2016-11-16 ENCOUNTER — Encounter: Payer: Self-pay | Admitting: Vascular Surgery

## 2016-11-20 ENCOUNTER — Encounter: Payer: Self-pay | Admitting: Vascular Surgery

## 2016-11-22 DIAGNOSIS — Z87891 Personal history of nicotine dependence: Secondary | ICD-10-CM | POA: Diagnosis not present

## 2016-11-22 DIAGNOSIS — E11622 Type 2 diabetes mellitus with other skin ulcer: Secondary | ICD-10-CM | POA: Diagnosis not present

## 2016-11-22 DIAGNOSIS — L97321 Non-pressure chronic ulcer of left ankle limited to breakdown of skin: Secondary | ICD-10-CM | POA: Diagnosis not present

## 2016-11-22 DIAGNOSIS — I890 Lymphedema, not elsewhere classified: Secondary | ICD-10-CM | POA: Diagnosis not present

## 2016-11-22 DIAGNOSIS — I872 Venous insufficiency (chronic) (peripheral): Secondary | ICD-10-CM | POA: Diagnosis not present

## 2016-11-23 ENCOUNTER — Ambulatory Visit (INDEPENDENT_AMBULATORY_CARE_PROVIDER_SITE_OTHER): Payer: Medicare Other | Admitting: Vascular Surgery

## 2016-11-23 ENCOUNTER — Encounter: Payer: Self-pay | Admitting: Vascular Surgery

## 2016-11-23 ENCOUNTER — Other Ambulatory Visit: Payer: Medicare Other | Admitting: Vascular Surgery

## 2016-11-23 VITALS — BP 133/85 | HR 67 | Temp 97.4°F | Resp 16 | Ht 66.5 in | Wt 175.0 lb

## 2016-11-23 DIAGNOSIS — I83023 Varicose veins of left lower extremity with ulcer of ankle: Secondary | ICD-10-CM

## 2016-11-23 DIAGNOSIS — L97329 Non-pressure chronic ulcer of left ankle with unspecified severity: Secondary | ICD-10-CM | POA: Diagnosis not present

## 2016-11-23 NOTE — Progress Notes (Signed)
     Laser Ablation Procedure    Date: 11/23/2016   [name] DOB:[birth year]  Consent signed: Yes    Surgeon:  Dr. Sherren Mocha Early  Procedure: Laser Ablation: left Small Saphenous Vein  BP 133/85   Pulse 67   Temp 97.4 F (36.3 C)   Resp 16   Ht 5' 6.5" (1.689 m)   Wt 175 lb (79.4 kg)   SpO2 98%   BMI 27.82 kg/m   Tumescent Anesthesia: 350 cc 0.9% NaCl with 50 cc Lidocaine HCL with 1% Epi and 15 cc 8.4% NaHCO3  Local Anesthesia: 1 cc Lidocaine HCL and NaHCO3 (ratio 2:1)  15 watts continuous mode        Total energy: 1278   Total time: 1:25    Patient tolerated procedure well  Notes:   Description of Procedure:  After marking the course of the secondary varicosities, the patient was placed on the operating table in the prone position, and the left leg was prepped and draped in sterile fashion.   Local anesthetic was administered and under ultrasound guidance the saphenous vein was accessed with a micro needle and guide wire; then the mirco puncture sheath was placed.  A guide wire was inserted saphenopopliteal junction , followed by a 5 french sheath.  The position of the sheath and then the laser fiber below the junction was confirmed using the ultrasound.  Tumescent anesthesia was administered along the course of the saphenous vein using ultrasound guidance. The patient was placed in Trendelenburg position and protective laser glasses were placed on patient and staff, and the laser was fired at 15 watts continuous mode advancing 1-62m/second for a total of 1278 joules.     Steri strips were applied to the stab wounds and ABD pads and thigh high compression stockings were applied.  Ace wrap bandages were applied over the phlebectomy sites and at the top of the saphenopopliteal junction. Blood loss was less than 15 cc.  The patient ambulated out of the operating room having tolerated the procedure well.  Uneventful ablation of small saphenous vein from mid calf to distal  thigh. Will be seen again in one week with follow-up duplex

## 2016-11-27 ENCOUNTER — Encounter: Payer: Self-pay | Admitting: Vascular Surgery

## 2016-11-30 ENCOUNTER — Encounter: Payer: Self-pay | Admitting: Vascular Surgery

## 2016-11-30 ENCOUNTER — Ambulatory Visit (INDEPENDENT_AMBULATORY_CARE_PROVIDER_SITE_OTHER): Payer: Medicare Other | Admitting: Vascular Surgery

## 2016-11-30 ENCOUNTER — Ambulatory Visit (HOSPITAL_COMMUNITY)
Admission: RE | Admit: 2016-11-30 | Discharge: 2016-11-30 | Disposition: A | Discharge status: Home / Self Care | Payer: Medicare Other | Source: Ambulatory Visit | Attending: Vascular Surgery | Admitting: Vascular Surgery

## 2016-11-30 VITALS — BP 144/89 | HR 61 | Temp 98.3°F | Resp 16 | Ht 66.5 in | Wt 175.0 lb

## 2016-11-30 DIAGNOSIS — L97329 Non-pressure chronic ulcer of left ankle with unspecified severity: Secondary | ICD-10-CM | POA: Insufficient documentation

## 2016-11-30 DIAGNOSIS — I83023 Varicose veins of left lower extremity with ulcer of ankle: Secondary | ICD-10-CM

## 2016-11-30 DIAGNOSIS — L97309 Non-pressure chronic ulcer of unspecified ankle with unspecified severity: Secondary | ICD-10-CM | POA: Diagnosis not present

## 2016-11-30 NOTE — Progress Notes (Signed)
Vitals:   11/30/16 1014  BP: (!) 146/70  Pulse: 60  Resp: 16  Temp: 98.3 F (36.8 C)  SpO2: 97%  Weight: 175 lb (79.4 kg)  Height: 5' 6.5" (1.689 m)

## 2016-11-30 NOTE — Progress Notes (Signed)
Vascular and Vein Specialist of [place]  Patient name: [name] Fernandez MRN: [id number] DOB: 01-10-1941 Sex: male  REASON FOR VISIT: Follow-up staged left great and left small saphenous vein ablation  HPI: [name] Fernandez is a 76 y.o. male here today for one-week follow-up of ablation of his left small saphenous vein. Underwent left great saphenous vein ablation on 11/02/2016. He has been extremely compliant with his compression garment and continues to do well. He is still having discomfort on the medial aspect of his left ankle ulcer. Does have persistent swelling in his left leg as well.  Past Medical History:  Diagnosis Date  . Anemia   . Arthritis   . Celiac disease    diagnoses 06/24/12  . Esophageal stricture   . H/O hiatal hernia   . Hiatal hernia   . Hypertension   . Hypothyroidism   . Prediabetes   . Shortness of breath    from oxycodone at times  . Vitamin B12 deficiency   . Vitamin D deficiency     Family History  Problem Relation Age of Onset  . Esophageal cancer Father     SOCIAL HISTORY: Social History  Substance Use Topics  . Smoking status: Former Smoker    Packs/day: 1.00    Years: 20.00    Types: Cigarettes    Quit date: 06/27/1980  . Smokeless tobacco: Never Used  . Alcohol use No    Allergies  Allergen Reactions  . Feraheme [Ferumoxytol] Other (See Comments)    Swelling of lips and tongue, could not talk 30 minutes after the infusion.  . Naprosyn [Naproxen] Swelling    Unsure if naprosyn caused this reaction  . Latex   . Levothyroxine     Lip swelling    Current Outpatient Prescriptions  Medication Sig Dispense Refill  . Calcium Carbonate-Vit D-Min (CALCIUM 1200 PO) Take by mouth daily.    . Cholecalciferol (VITAMIN D PO) Take 5,000 Int'l Units by mouth daily.    . Cyanocobalamin (VITAMIN B 12 PO) Take 2,500 mcg by mouth 2 (two) times daily.    . furosemide (LASIX) 40 MG tablet TAKE 1 TABLET BY MOUTH  EVERY DAY 90 tablet 0  . MAGNESIUM PO Take 500 mg by mouth 2 (two) times daily.     . metolazone (ZAROXOLYN) 5 MG tablet Take 1/2 to 1 tablet daily or as directed for fluid & swelling 90 tablet 1  . MILK THISTLE PO Take 240 mg by mouth daily.    . Omega-3 Fatty Acids (OMEGA-3 FISH OIL PO) Take by mouth daily.    Marland Kitchen omeprazole (PRILOSEC) 20 MG capsule Take 1 capsule (20 mg total) by mouth daily. 90 capsule 3  . OVER THE COUNTER MEDICATION Thyroid complex 2 tabs daily.    Marland Kitchen OVER THE COUNTER MEDICATION Kelp with Iodine 1 daily    . OVER THE COUNTER MEDICATION Tumeric Curcumin 500 mg 2 times daily    . oxyCODONE (OXY IR/ROXICODONE) 5 MG immediate release tablet TAKE 1 TABLET EVERY 4 HOURS AS NEEDED  0  . Red Yeast Rice 600 MG CAPS Take 600 mg by mouth 3 (three) times daily.    . Sennosides (SENOKOT PO) Take by mouth as needed.    . terbinafine (LAMISIL) 1 % cream Apply 1 application topically daily. For 4 weeks 42 g 3  . Testosterone 10 MG/ACT (2%) GEL Place 2 ML/mg topically each day 120 g 1  . FLUZONE HIGH-DOSE 0.5 ML SUSY TO BE ADMINISTERED  BY PHARMACIST FOR IMMUNIZATION  0  . PNEUMOVAX 23 25 MCG/0.5ML injection TO BE ADMINISTERED BY PHARMACIST FOR IMMUNIZATION  0   No current facility-administered medications for this visit.     REVIEW OF SYSTEMS:  [X]  denotes positive finding, [ ]  denotes negative finding Cardiac  Comments:  Chest pain or chest pressure:    Shortness of breath upon exertion:    Short of breath when lying flat:    Irregular heart rhythm:        Vascular    Pain in calf, thigh, or hip brought on by ambulation:    Pain in feet at night that wakes you up from your sleep:     Blood clot in your veins:    Leg swelling:  x         PHYSICAL EXAM: Vitals:   11/30/16 1014 11/30/16 1015  BP: (!) 146/70 (!) 144/89  Pulse: 60 61  Resp: 16   Temp: 98.3 F (36.8 C)   SpO2: 97%   Weight: 175 lb (79.4 kg)   Height: 5' 6.5" (1.689 m)     GENERAL: The patient is a  well-nourished male, in no acute distress. The vital signs are documented above. CARDIOVASCULAR: Mild bruising and no evidence of thrombophlebitis in his left calf PULMONARY: There is good air exchange  MUSCULOSKELETAL: There are no major deformities or cyanosis. NEUROLOGIC: No focal weakness or paresthesias are detected. SKIN: There are no ulcers or rashes noted. PSYCHIATRIC: The patient has a normal affect.  DATA:  Duplex shows closure of his small saphenous vein to within 1/2 cm of his saphenous popliteal junction. No DVT  MEDICAL ISSUES: Stable follow-up after closure of his great and small saphenous vein for correction of the superficial venous hypertension continue his follow-up in the wound center. I will continue with elevation and compression and see [place] again on as-needed basis    Rosetta Posner, MD [facility] Vascular and Vein Specialists of [facility] Tel [phone] Pager [phone]

## 2016-12-06 DIAGNOSIS — E11622 Type 2 diabetes mellitus with other skin ulcer: Secondary | ICD-10-CM | POA: Diagnosis not present

## 2016-12-06 DIAGNOSIS — Z87891 Personal history of nicotine dependence: Secondary | ICD-10-CM | POA: Diagnosis not present

## 2016-12-06 DIAGNOSIS — L97321 Non-pressure chronic ulcer of left ankle limited to breakdown of skin: Secondary | ICD-10-CM | POA: Diagnosis not present

## 2016-12-06 DIAGNOSIS — I872 Venous insufficiency (chronic) (peripheral): Secondary | ICD-10-CM | POA: Diagnosis not present

## 2016-12-06 DIAGNOSIS — I890 Lymphedema, not elsewhere classified: Secondary | ICD-10-CM | POA: Diagnosis not present

## 2016-12-08 DIAGNOSIS — M40204 Unspecified kyphosis, thoracic region: Secondary | ICD-10-CM | POA: Diagnosis not present

## 2016-12-08 DIAGNOSIS — M546 Pain in thoracic spine: Secondary | ICD-10-CM | POA: Diagnosis not present

## 2016-12-08 DIAGNOSIS — L97922 Non-pressure chronic ulcer of unspecified part of left lower leg with fat layer exposed: Secondary | ICD-10-CM | POA: Diagnosis not present

## 2016-12-08 DIAGNOSIS — G894 Chronic pain syndrome: Secondary | ICD-10-CM | POA: Diagnosis not present

## 2016-12-19 ENCOUNTER — Encounter (HOSPITAL_BASED_OUTPATIENT_CLINIC_OR_DEPARTMENT_OTHER): Payer: Medicare Other | Attending: Surgery

## 2016-12-19 DIAGNOSIS — Z09 Encounter for follow-up examination after completed treatment for conditions other than malignant neoplasm: Secondary | ICD-10-CM | POA: Diagnosis not present

## 2016-12-19 DIAGNOSIS — Z872 Personal history of diseases of the skin and subcutaneous tissue: Secondary | ICD-10-CM | POA: Insufficient documentation

## 2016-12-19 DIAGNOSIS — E119 Type 2 diabetes mellitus without complications: Secondary | ICD-10-CM | POA: Diagnosis not present

## 2016-12-19 DIAGNOSIS — L97329 Non-pressure chronic ulcer of left ankle with unspecified severity: Secondary | ICD-10-CM | POA: Diagnosis not present

## 2016-12-26 NOTE — Progress Notes (Deleted)
Assessment and Plan:   Essential hypertension - continue medications, DASH diet, exercise and monitor at home. Call if greater than 130/80.  - CBC with Differential/Platelet - BASIC METABOLIC PANEL WITH GFR - Hepatic function panel  Abnormal glucose - Hemoglobin A1c  Hypothyroidism, unspecified hypothyroidism type Hypothyroidism-check TSH level, continue medications the same, reminded to take on an empty stomach 30-33mns before food.  - TSH  Hyperlipidemia -continue medications, check lipids, decrease fatty foods, increase activity.  - Lipid panel   COPD No exacerbation, monitor breathing   Medication management - Magnesium  Chronic pain syndrome Has does a great job of decreasing medications on his own Will try topamax to see if this help decrease need of pain meds, if this does not help discussed trying cymbalta with the patient.  - topiramate (TOPAMAX) 50 MG tablet; Take 1 tablet (50 mg total) by mouth 2 (two) times daily.  Dispense: 60 tablet; Refill: 2   Continue diet and meds as discussed. Further disposition pending results of labs. Future Appointments Date Time Provider [place] 12/27/2016 2:30 PM CVicie Mutters PA-C [facility] None  07/18/2017 9:00 AM MUnk Pinto MD [facility] None    HPI 76y.o. male  presents for 3 month follow up with hypertension, hyperlipidemia, prediabetes and vitamin D. 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 PHardin Negusfor pain management but he has been reducing his oxycodone due to swelling, he is on 549mdaily. . Marland Kitchene denies chest pain, shortness of breath, dizziness.  He is not on cholesterol medication and denies myalgias. His cholesterol is at goal. The cholesterol last visit was:   Lab Results  Component Value Date   CHOL 203 (H) 09/25/2016   HDL 27 (L)  09/25/2016   LDLCALC 140 (H) 09/25/2016   TRIG 179 (H) 09/25/2016   CHOLHDL 7.5 (H) 09/25/2016   He has been working on diet and exercise for prediabetes, and denies polydipsia and polyuria. Last A1C in the office was:  Lab Results  Component Value Date   HGBA1C 6.0 (H) 09/25/2016   Lab Results  Component Value Date   GFRNONAA 77 09/25/2016   Patient is on Vitamin D supplement.   Lab Results  Component Value Date   VD25OH 72 06/16/2016   He is on thyroid medication. His medication was changed last visit, 5015mdaily.   Lab Results  Component Value Date   TSH 3.81 09/25/2016  .  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 He states that the testosterone helps with his energy, libido, muscle mass. Lab Results  Component Value Date   TESTOSTERONE 336 06/16/2016   BMI is There is no height or weight on file to calculate BMI., he is working on diet and exercise. Wt Readings from Last 3 Encounters:  11/30/16 175 lb (79.4 kg)  11/23/16 175 lb (79.4 kg)  11/09/16 182 lb 14.4 oz (83 kg)    Current Medications:  Current Outpatient Prescriptions on File Prior to Visit  Medication Sig  . Calcium Carbonate-Vit D-Min (CALCIUM 1200 PO) Take by mouth daily.  . Cholecalciferol (VITAMIN D PO) Take 5,000 Int'l Units by mouth daily.  . Cyanocobalamin (VITAMIN B 12 PO) Take 2,500 mcg by mouth 2 (two) times daily.  . FMarland KitchenUZONE HIGH-DOSE 0.5 ML SUSY TO BE ADMINISTERED BY PHARMACIST FOR  IMMUNIZATION  . furosemide (LASIX) 40 MG tablet TAKE 1 TABLET BY MOUTH EVERY DAY  . MAGNESIUM PO Take 500 mg by mouth 2 (two) times daily.   . metolazone (ZAROXOLYN) 5 MG tablet Take 1/2 to 1 tablet daily or as directed for fluid & swelling  . MILK THISTLE PO Take 240 mg by mouth daily.  . Omega-3 Fatty Acids (OMEGA-3 FISH OIL PO) Take by mouth daily.  Marland Kitchen omeprazole (PRILOSEC) 20 MG capsule Take 1 capsule (20 mg total) by mouth daily.  Marland Kitchen OVER THE COUNTER MEDICATION Thyroid  complex 2 tabs daily.  Marland Kitchen OVER THE COUNTER MEDICATION Kelp with Iodine 1 daily  . OVER THE COUNTER MEDICATION Tumeric Curcumin 500 mg 2 times daily  . oxyCODONE (OXY IR/ROXICODONE) 5 MG immediate release tablet TAKE 1 TABLET EVERY 4 HOURS AS NEEDED  . PNEUMOVAX 23 25 MCG/0.5ML injection TO BE ADMINISTERED BY PHARMACIST FOR IMMUNIZATION  . Red Yeast Rice 600 MG CAPS Take 600 mg by mouth 3 (three) times daily.  . Sennosides (SENOKOT PO) Take by mouth as needed.  . terbinafine (LAMISIL) 1 % cream Apply 1 application topically daily. For 4 weeks  . Testosterone 10 MG/ACT (2%) GEL Place 2 ML/mg topically each day   No current facility-administered medications on file prior to visit.    Medical History:  Past Medical History:  Diagnosis Date  . Anemia   . Arthritis   . Celiac disease    diagnoses 06/24/12  . Esophageal stricture   . H/O hiatal hernia   . Hiatal hernia   . Hypertension   . Hypothyroidism   . Prediabetes   . Shortness of breath    from oxycodone at times  . Vitamin B12 deficiency   . Vitamin D deficiency    Allergies:  Allergies  Allergen Reactions  . Feraheme [Ferumoxytol] Other (See Comments)    Swelling of lips and tongue, could not talk 30 minutes after the infusion.  . Naprosyn [Naproxen] Swelling    Unsure if naprosyn caused this reaction  . Latex   . Levothyroxine     Lip swelling   Review of Systems  Constitutional: Negative.   HENT: Negative for congestion, ear discharge, ear pain, hearing loss, nosebleeds, sore throat and tinnitus.   Eyes: Negative.   Respiratory: Negative for cough, hemoptysis, sputum production, shortness of breath, wheezing and stridor.   Cardiovascular: Positive for leg swelling. Negative for chest pain, palpitations, orthopnea, claudication and PND.  Gastrointestinal: Positive for constipation. Negative for abdominal pain, blood in stool, diarrhea, heartburn, melena, nausea and vomiting.  Genitourinary: Negative for dysuria, flank  pain, frequency, hematuria and urgency.  Musculoskeletal: Positive for back pain, joint pain and myalgias. Negative for falls and neck pain.  Skin: Positive for itching. Negative for rash.  Neurological: Negative for dizziness, tingling, tremors, sensory change, speech change, focal weakness, seizures, loss of consciousness and headaches.  Endo/Heme/Allergies: Negative.   Psychiatric/Behavioral: Negative.      Family history- Review and unchanged Social history- Review and unchanged Physical Exam: There were no vitals taken for this visit. Wt Readings from Last 3 Encounters:  11/30/16 175 lb (79.4 kg)  11/23/16 175 lb (79.4 kg)  11/09/16 182 lb 14.4 oz (83 kg)   General Appearance: Well nourished, in no apparent distress. Eyes: PERRLA, EOMs, conjunctiva no swelling or erythema Sinuses: No Frontal/maxillary tenderness ENT/Mouth: Ext aud canals clear, cerumen impaction right ear with manual removal of ear wax, tolerated well, TMs without erythema, bulging. No erythema, swelling, or  exudate on post pharynx.  Tonsils not swollen or erythematous. Hearing normal.  Neck: Supple, thyroid normal.  Respiratory: Respiratory effort normal, BS equal bilaterally without rales, rhonchi, wheezing or stridor.  Cardio: RRR with no MRGs. Brisk peripheral pulses with 1+ edema.  Abdomen: Soft, + BS.  Mild epigastric tenderness with very small ventral hernias, no guarding, rebound,masses. Lymphatics: Non tender without lymphadenopathy.  Musculoskeletal: Full ROM, 5/5 strength, normal gait.  Skin: Warm, dry without rashes, lesions, ecchymosis.  Neuro: Cranial nerves intact. Normal muscle tone, no cerebellar symptoms. Sensation intact.  Psych: Awake and oriented X 3, normal affect, Insight and Judgment appropriate.    Vicie Mutters 9:38 AM  Error: should be cosigned by Dr. Melford Aase

## 2016-12-27 ENCOUNTER — Ambulatory Visit: Payer: Self-pay | Admitting: Physician Assistant

## 2016-12-27 ENCOUNTER — Ambulatory Visit: Payer: Self-pay | Admitting: Internal Medicine

## 2016-12-30 ENCOUNTER — Other Ambulatory Visit: Payer: Self-pay | Admitting: Internal Medicine

## 2017-02-02 DIAGNOSIS — M546 Pain in thoracic spine: Secondary | ICD-10-CM | POA: Diagnosis not present

## 2017-02-02 DIAGNOSIS — G894 Chronic pain syndrome: Secondary | ICD-10-CM | POA: Diagnosis not present

## 2017-02-02 DIAGNOSIS — L97922 Non-pressure chronic ulcer of unspecified part of left lower leg with fat layer exposed: Secondary | ICD-10-CM | POA: Diagnosis not present

## 2017-02-02 DIAGNOSIS — M40204 Unspecified kyphosis, thoracic region: Secondary | ICD-10-CM | POA: Diagnosis not present

## 2017-03-02 DIAGNOSIS — Z23 Encounter for immunization: Secondary | ICD-10-CM | POA: Diagnosis not present

## 2017-03-30 ENCOUNTER — Other Ambulatory Visit: Payer: Self-pay | Admitting: Physician Assistant

## 2017-04-10 DIAGNOSIS — M40204 Unspecified kyphosis, thoracic region: Secondary | ICD-10-CM | POA: Diagnosis not present

## 2017-04-10 DIAGNOSIS — M546 Pain in thoracic spine: Secondary | ICD-10-CM | POA: Diagnosis not present

## 2017-04-10 DIAGNOSIS — G894 Chronic pain syndrome: Secondary | ICD-10-CM | POA: Diagnosis not present

## 2017-04-10 DIAGNOSIS — Z79891 Long term (current) use of opiate analgesic: Secondary | ICD-10-CM | POA: Diagnosis not present

## 2017-04-11 ENCOUNTER — Encounter: Payer: Self-pay | Admitting: Internal Medicine

## 2017-06-07 DIAGNOSIS — M546 Pain in thoracic spine: Secondary | ICD-10-CM | POA: Diagnosis not present

## 2017-06-07 DIAGNOSIS — Z79891 Long term (current) use of opiate analgesic: Secondary | ICD-10-CM | POA: Diagnosis not present

## 2017-06-07 DIAGNOSIS — G894 Chronic pain syndrome: Secondary | ICD-10-CM | POA: Diagnosis not present

## 2017-06-07 DIAGNOSIS — M40204 Unspecified kyphosis, thoracic region: Secondary | ICD-10-CM | POA: Diagnosis not present

## 2017-07-18 ENCOUNTER — Encounter: Payer: Self-pay | Admitting: Internal Medicine

## 2017-07-24 DIAGNOSIS — H40023 Open angle with borderline findings, high risk, bilateral: Secondary | ICD-10-CM | POA: Diagnosis not present

## 2017-07-24 DIAGNOSIS — H35363 Drusen (degenerative) of macula, bilateral: Secondary | ICD-10-CM | POA: Diagnosis not present

## 2017-07-31 ENCOUNTER — Encounter: Payer: Self-pay | Admitting: Internal Medicine

## 2017-07-31 ENCOUNTER — Ambulatory Visit (INDEPENDENT_AMBULATORY_CARE_PROVIDER_SITE_OTHER): Payer: Medicare Other | Admitting: Internal Medicine

## 2017-07-31 VITALS — BP 138/86 | HR 68 | Temp 97.3°F | Resp 16 | Ht 66.0 in | Wt 186.0 lb

## 2017-07-31 DIAGNOSIS — E349 Endocrine disorder, unspecified: Secondary | ICD-10-CM

## 2017-07-31 DIAGNOSIS — Z79899 Other long term (current) drug therapy: Secondary | ICD-10-CM | POA: Diagnosis not present

## 2017-07-31 DIAGNOSIS — E538 Deficiency of other specified B group vitamins: Secondary | ICD-10-CM | POA: Diagnosis not present

## 2017-07-31 DIAGNOSIS — E039 Hypothyroidism, unspecified: Secondary | ICD-10-CM

## 2017-07-31 DIAGNOSIS — Z136 Encounter for screening for cardiovascular disorders: Secondary | ICD-10-CM

## 2017-07-31 DIAGNOSIS — R7309 Other abnormal glucose: Secondary | ICD-10-CM | POA: Diagnosis not present

## 2017-07-31 DIAGNOSIS — I10 Essential (primary) hypertension: Secondary | ICD-10-CM | POA: Diagnosis not present

## 2017-07-31 DIAGNOSIS — D519 Vitamin B12 deficiency anemia, unspecified: Secondary | ICD-10-CM

## 2017-07-31 DIAGNOSIS — Z87891 Personal history of nicotine dependence: Secondary | ICD-10-CM | POA: Diagnosis not present

## 2017-07-31 DIAGNOSIS — Z1212 Encounter for screening for malignant neoplasm of rectum: Secondary | ICD-10-CM

## 2017-07-31 DIAGNOSIS — N401 Enlarged prostate with lower urinary tract symptoms: Secondary | ICD-10-CM

## 2017-07-31 DIAGNOSIS — Z1211 Encounter for screening for malignant neoplasm of colon: Secondary | ICD-10-CM

## 2017-07-31 DIAGNOSIS — E559 Vitamin D deficiency, unspecified: Secondary | ICD-10-CM

## 2017-07-31 DIAGNOSIS — E782 Mixed hyperlipidemia: Secondary | ICD-10-CM

## 2017-07-31 NOTE — Patient Instructions (Signed)

## 2017-07-31 NOTE — Progress Notes (Signed)
[facility]   Unk Pinto, M.D.     Uvaldo Bristle. Silverio Lay, P.A.-C Liane Comber, [place]                [address] [place] [address] Telephone [phone] Telefax [phone] Annual  Screening/Preventative Visit  & Comprehensive Evaluation & Examination     This very nice 77 y.o. MWM presents for a Screening/Preventative Visit & comprehensive evaluation and management of multiple medical co-morbidities.  Patient has been followed for HTN, T2_NIDDM  Prediabetes, Hyperlipidemia and Vitamin D Deficiency.     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 Patient has GERD which is controlled on current meds &diet. Patient also has hx/o low B12 levels in the past.      HTN predates since 2000. Patient's BP has been controlled at home.  Today's BP was initially borderline elevated and rechecked at goal - 138/86. Patient denies any cardiac symptoms as chest pain, palpitations, shortness of breath, dizziness or ankle swelling.     Patient's hyperlipidemia is controlled with diet and medications. Patient denies myalgias or other medication SE's. 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  Patient denies reactive hypoglycemic symptoms, visual blurring, diabetic polys or paresthesias. Last A1c was still not at goal: Lab Results  Component Value Date   HGBA1C 6.2 (H) 07/31/2017       Patient has been on OTC Thyroid replacement since 2011. He also is on Testosterone replacement. Finally, patient has history of Vitamin D Deficiency ("26"/2009)  and last vitamin D was  still not at goal (70-100): Lab Results  Component Value Date   VD25OH 53 07/31/2017   Current Outpatient Medications on File Prior to Visit  Medication Sig  . Calcium Carbonate-Vit D-Min (CALCIUM 1200 PO) Take by mouth daily.  . Cholecalciferol (VITAMIN D PO) Take 5,000 Int'l Units by mouth daily.  . Cyanocobalamin (VITAMIN B 12 PO) Take 2,500 mcg by mouth 2 (two) times daily.  Marland Kitchen MAGNESIUM PO Take 500 mg by mouth 2 (two) times daily.   . metolazone (ZAROXOLYN) 5 MG tablet Take 1/2 to 1 tablet daily or as directed for fluid & swelling  . MILK THISTLE PO Take 240 mg by mouth daily.  . Omega-3 Fatty Acids (OMEGA-3 FISH OIL PO) Take by mouth daily.  Marland Kitchen omeprazole (PRILOSEC) 20 MG capsule Take 1 capsule (20 mg total) by mouth daily.  Marland Kitchen OVER THE COUNTER MEDICATION Thyroid complex 2 tabs daily.  Marland Kitchen OVER THE COUNTER MEDICATION Kelp with Iodine 1 daily  . OVER THE COUNTER MEDICATION Tumeric Curcumin 500 mg 2 times daily  . OVER THE COUNTER MEDICATION Chlor oxygen red blood builder 500 mg 2 capsules daily.  Marland Kitchen oxyCODONE (OXY IR/ROXICODONE) 5 MG immediate release tablet TAKE 1 TABLET EVERY 4 HOURS AS NEEDED  . Red Yeast Rice 600 MG CAPS Take 600 mg by mouth 3 (three) times daily.  . Sennosides (SENOKOT PO) Take by mouth as needed.  . terbinafine (  LAMISIL) 1 % cream Apply 1 application topically daily. For 4 weeks  . Testosterone 10 MG/ACT (2%) GEL Place 2 ML/mg topically each day   No current facility-administered medications on file prior to visit.    Allergies  Allergen Reactions  . Feraheme [Ferumoxytol] Other (See Comments)    Swelling of lips and tongue, could not talk 30 minutes after the infusion.  . Naprosyn [Naproxen] Swelling    Unsure if naprosyn caused this reaction  . Latex   . Levothyroxine     Lip swelling   Past Medical History:  Diagnosis Date  . Anemia   . Arthritis   . Celiac disease    diagnoses 06/24/12  . Esophageal stricture   . H/O hiatal hernia   . Hiatal  hernia   . Hypertension   . Hypothyroidism   . Prediabetes   . Shortness of breath    from oxycodone at times  . Vitamin B12 deficiency   . Vitamin D deficiency    Health Maintenance  Topic Date Due  . FOOT EXAM  06/16/2017  . OPHTHALMOLOGY EXAM  07/11/2017  . HEMOGLOBIN A1C  01/28/2018  . URINE MICROALBUMIN  07/31/2018  . TETANUS/TDAP  01/11/2021  . INFLUENZA VACCINE  Completed  . PNA vac Low Risk Adult  Completed   Immunization History  Administered Date(s) Administered  . DT 01/12/2011  . Influenza, High Dose Seasonal PF 05/20/2014, 05/26/2015  . Influenza-Unspecified 04/30/2013, 03/29/2016, 03/02/2017  . Pneumococcal Conjugate-13 05/20/2014  . Pneumococcal Polysaccharide-23 03/29/2016  . Pneumococcal-Unspecified 05/20/2008  . Zoster 07/15/2014   Last Colon -  Past Surgical History:  Procedure Laterality Date  . BACK SURGERY     3 diff surgeries for vertebrea broken  . EYE SURGERY     bilateral cataract surgery  . HERNIA REPAIR  1950's   bilateral groin as child  . HIATAL HERNIA REPAIR    . JOINT REPLACEMENT  04/2010   right knee replacement  . TONSILLECTOMY     as child  . TOTAL KNEE REVISION  11/20/2011   Procedure: TOTAL KNEE REVISION;  Surgeon: Mauri Pole, MD;  Location: [facility];  Service: Orthopedics;  Laterality: Right;  Right Total Knee Revision   Family History  Problem Relation Age of Onset  . Esophageal cancer Father    Social History   Socioeconomic History  . Marital status: Married    Spouse name: Not on file  . Number of children: 2  . Years of education: Not on file  . Highest education level: Not on file  Occupational History  . Occupation: Retired    Fish farm manager: [employer]  Tobacco Use  . Smoking status: Former Smoker    Packs/day: 1.00    Years: 20.00    Pack years: 20.00    Types: Cigarettes    Last attempt to quit: 06/27/1980    Years since quitting: 37.1  . Smokeless tobacco: Never Used  Substance and Sexual  Activity  . Alcohol use: No  . Drug use: No  . Sexual activity: No    ROS Constitutional: Denies fever, chills, weight loss/gain, headaches, insomnia,  night sweats or change in appetite. Does c/o fatigue. Eyes: Denies redness, blurred vision, diplopia, discharge, itchy or watery eyes.  ENT: Denies discharge, congestion, post nasal drip, epistaxis, sore throat, earache, hearing loss, dental pain, Tinnitus, Vertigo, Sinus pain or snoring.  Cardio: Denies chest pain, palpitations, irregular heartbeat, syncope, dyspnea, diaphoresis, orthopnea, PND, claudication or edema Respiratory: denies cough, dyspnea,  DOE, pleurisy, hoarseness, laryngitis or wheezing.  Gastrointestinal: Denies dysphagia, heartburn, reflux, water brash, pain, cramps, nausea, vomiting, bloating, diarrhea, constipation, hematemesis, melena, hematochezia, jaundice or hemorrhoids Genitourinary: Denies dysuria, discharge, hematuria or flank pain. He does endorse  frequency, urgency, nocturia x 2-3 and hesitancy. Musculoskeletal: Denies arthralgia, myalgia, stiffness, Jt. Swelling, pain, limp or strain/sprain. Denies Falls. Skin: Denies puritis, rash, hives, warts, acne, eczema or change in skin lesion Neuro: No weakness, tremor, incoordination, spasms, paresthesia or pain Psychiatric: Denies confusion, memory loss or sensory loss. Denies Depression. Endocrine: Denies change in weight, skin, hair change, nocturia, and paresthesia, diabetic polys, visual blurring or hyper / hypo glycemic episodes.  Heme/Lymph: No excessive bleeding, bruising or enlarged lymph nodes.  Physical Exam  BP 138/86   Pulse 68   Temp (!) 97.3 F (36.3 C)   Resp 16   Ht 5' 6"  (1.676 m)   Wt 186 lb (84.4 kg)   BMI 30.02 kg/m   General Appearance: Well nourished and well groomed and in no apparent distress.  Eyes: PERRLA, EOMs, conjunctiva no swelling or erythema, normal fundi and vessels. Sinuses: No frontal/maxillary tenderness ENT/Mouth: EACs  patent / TMs  nl. Nares clear without erythema, swelling, mucoid exudates. Oral hygiene is good. No erythema, swelling, or exudate. Tongue normal, non-obstructing. Tonsils not swollen or erythematous. Hearing normal.  Neck: Supple, thyroid not palpable. No bruits, nodes or JVD. Respiratory: Respiratory effort normal.  BS equal and clear bilateral without rales, rhonci, wheezing or stridor. Cardio: Heart sounds are normal with regular rate and rhythm and no murmurs, rubs or gallops. Peripheral pulses are normal and equal bilaterally without edema. No aortic or femoral bruits. Chest: symmetric with normal excursions and percussion.  Abdomen: Soft, with Nl bowel sounds. Nontender, no guarding, rebound, hernias, masses, or organomegaly.  Lymphatics: Non tender without lymphadenopathy.  Genitourinary: No hernias.Testes nl. DRE - prostate nl for age - smooth & firm w/o nodules. Musculoskeletal: Full ROM all peripheral extremities, joint stability, 5/5 strength, and normal gait. Skin: Warm and dry without rashes, lesions, cyanosis, clubbing or  ecchymosis.  Neuro: Cranial nerves intact, reflexes equal bilaterally. Normal muscle tone, no cerebellar symptoms. Sensation intact to touch, vibratory and Monofilament to the toes bilaterally. Pysch: Alert and oriented X 3 with normal affect, insight and judgment appropriate.   Assessment and Plan  1. Essential hypertension  - EKG 12-Lead - [place], RETROPERITNL ABD,  LTD - Urinalysis, Routine w reflex microscopic - Microalbumin / creatinine urine ratio - BASIC METABOLIC PANEL WITH GFR - Magnesium - TSH - CBC with Differential/Platelet  2. Hyperlipidemia, mixed  - EKG 12-Lead - [place], RETROPERITNL ABD,  LTD - Hepatic function panel - Lipid panel - TSH  3. Abnormal glucose  - EKG 12-Lead - [place], RETROPERITNL ABD,  LTD - Hemoglobin A1c - Insulin, random  4. Vitamin D deficiency  - VITAMIN D 25 Hydroxy   5. Hypothyroidism, unspecified type  -  TSH  6. Vitamin B12 deficiency  - Vitamin B12  7. Testosterone deficiency  - Testosterone  8. Benign localized prostatic hyperplasia with lower urinary tract symptoms (LUTS)  - PSA  9. Screening for colorectal cancer  - POC Hemoccult Bld/Stl  10. Screening for ischemic heart disease  - EKG 12-Lead  11. Former smoker  - EKG 12-Lead - [place], RETROPERITNL ABD,  LTD  12. Screening for AAA (aortic abdominal aneurysm)  - EKG 12-Lead - [place], RETROPERITNL ABD,  LTD  13. Medication management  - Urinalysis, Routine w reflex microscopic - Microalbumin /  creatinine urine ratio - BASIC METABOLIC PANEL WITH GFR - Hepatic function panel - Magnesium - Lipid panel - TSH - Hemoglobin A1c - Insulin, random - VITAMIN D 25 Hydroxy - Vitamin B12 - Testosterone - CBC with Differential/Platelet  14. Anemia due to vitamin B12 deficiency  - Vitamin B12 - CBC with Differential/Platelet         Patient was counseled in prudent diet, weight control to achieve/maintain BMI less than 25, BP monitoring, regular exercise and medications as discussed.  Discussed med effects and SE's. Routine screening labs and tests as requested with regular follow-up as recommended. Over 40 minutes of exam, counseling, chart review and high complex critical decision making was performed

## 2017-08-01 ENCOUNTER — Other Ambulatory Visit: Payer: Self-pay | Admitting: Internal Medicine

## 2017-08-01 DIAGNOSIS — E782 Mixed hyperlipidemia: Secondary | ICD-10-CM

## 2017-08-01 LAB — URINALYSIS, ROUTINE W REFLEX MICROSCOPIC
Bilirubin Urine: NEGATIVE
Glucose, UA: NEGATIVE
HGB URINE DIPSTICK: NEGATIVE
KETONES UR: NEGATIVE
Leukocytes, UA: NEGATIVE
NITRITE: NEGATIVE
PH: 8 (ref 5.0–8.0)
Protein, ur: NEGATIVE
Specific Gravity, Urine: 1.01 (ref 1.001–1.03)

## 2017-08-01 LAB — CBC WITH DIFFERENTIAL/PLATELET
BASOS ABS: 66 {cells}/uL (ref 0–200)
BASOS PCT: 0.8 %
EOS ABS: 271 {cells}/uL (ref 15–500)
Eosinophils Relative: 3.3 %
HCT: 33.6 % — ABNORMAL LOW (ref 38.5–50.0)
HEMOGLOBIN: 11 g/dL — AB (ref 13.2–17.1)
Lymphs Abs: 2394 cells/uL (ref 850–3900)
MCH: 27 pg (ref 27.0–33.0)
MCHC: 32.7 g/dL (ref 32.0–36.0)
MCV: 82.6 fL (ref 80.0–100.0)
MONOS PCT: 9.6 %
MPV: 10.3 fL (ref 7.5–12.5)
Neutro Abs: 4682 cells/uL (ref 1500–7800)
Neutrophils Relative %: 57.1 %
PLATELETS: 369 10*3/uL (ref 140–400)
RBC: 4.07 10*6/uL — ABNORMAL LOW (ref 4.20–5.80)
RDW: 17.1 % — ABNORMAL HIGH (ref 11.0–15.0)
TOTAL LYMPHOCYTE: 29.2 %
WBC: 8.2 10*3/uL (ref 3.8–10.8)
WBCMIX: 787 {cells}/uL (ref 200–950)

## 2017-08-01 LAB — LIPID PANEL
CHOL/HDL RATIO: 5.7 (calc) — AB (ref <5.0)
Cholesterol: 194 mg/dL (ref <200)
HDL: 34 mg/dL — AB (ref >40)
LDL CHOLESTEROL (CALC): 136 mg/dL — AB
NON-HDL CHOLESTEROL (CALC): 160 mg/dL — AB (ref <130)
TRIGLYCERIDES: 122 mg/dL (ref <150)

## 2017-08-01 LAB — BASIC METABOLIC PANEL WITH GFR
BUN: 10 mg/dL (ref 7–25)
CO2: 27 mmol/L (ref 20–32)
CREATININE: 0.97 mg/dL (ref 0.70–1.18)
Calcium: 9.6 mg/dL (ref 8.6–10.3)
Chloride: 103 mmol/L (ref 98–110)
GFR, EST NON AFRICAN AMERICAN: 76 mL/min/{1.73_m2} (ref >=60)
GFR, Est African American: 88 mL/min/{1.73_m2} (ref >=60)
Glucose, Bld: 102 mg/dL — ABNORMAL HIGH (ref 65–99)
Potassium: 4.3 mmol/L (ref 3.5–5.3)
SODIUM: 139 mmol/L (ref 135–146)

## 2017-08-01 LAB — HEPATIC FUNCTION PANEL
AG RATIO: 1.3 (calc) (ref 1.0–2.5)
ALBUMIN MSPROF: 4.3 g/dL (ref 3.6–5.1)
ALT: 29 U/L (ref 9–46)
AST: 46 U/L — ABNORMAL HIGH (ref 10–35)
Alkaline phosphatase (APISO): 77 U/L (ref 40–115)
BILIRUBIN TOTAL: 0.9 mg/dL (ref 0.2–1.2)
Bilirubin, Direct: 0.2 mg/dL (ref 0.0–0.2)
GLOBULIN: 3.2 g/dL (ref 1.9–3.7)
Indirect Bilirubin: 0.7 mg/dL (calc) (ref 0.2–1.2)
Total Protein: 7.5 g/dL (ref 6.1–8.1)

## 2017-08-01 LAB — MAGNESIUM: Magnesium: 2.1 mg/dL (ref 1.5–2.5)

## 2017-08-01 LAB — HEMOGLOBIN A1C
Hgb A1c MFr Bld: 6.2 % of total Hgb — ABNORMAL HIGH (ref <5.7)
Mean Plasma Glucose: 131 (calc)
eAG (mmol/L): 7.3 (calc)

## 2017-08-01 LAB — MICROALBUMIN / CREATININE URINE RATIO
Creatinine, Urine: 59 mg/dL (ref 20–320)
MICROALB UR: 0.7 mg/dL
Microalb Creat Ratio: 12 mcg/mg creat (ref <30)

## 2017-08-01 LAB — VITAMIN B12: Vitamin B-12: 957 pg/mL (ref 200–1100)

## 2017-08-01 LAB — TSH: TSH: 3.05 mIU/L (ref 0.40–4.50)

## 2017-08-01 LAB — VITAMIN D 25 HYDROXY (VIT D DEFICIENCY, FRACTURES): Vit D, 25-Hydroxy: 53 ng/mL (ref 30–100)

## 2017-08-01 LAB — PSA: PSA: 0.9 ng/mL (ref <=4.0)

## 2017-08-01 LAB — INSULIN, RANDOM: Insulin: 7.5 u[IU]/mL (ref 2.0–19.6)

## 2017-08-01 LAB — TESTOSTERONE: TESTOSTERONE: 417 ng/dL (ref 250–827)

## 2017-08-01 MED ORDER — ROSUVASTATIN CALCIUM 40 MG PO TABS: ORAL_TABLET | ORAL | 5 refills | Status: DC

## 2017-08-05 ENCOUNTER — Encounter: Payer: Self-pay | Admitting: Internal Medicine

## 2017-08-07 DIAGNOSIS — M40204 Unspecified kyphosis, thoracic region: Secondary | ICD-10-CM | POA: Diagnosis not present

## 2017-08-07 DIAGNOSIS — G894 Chronic pain syndrome: Secondary | ICD-10-CM | POA: Diagnosis not present

## 2017-08-07 DIAGNOSIS — Z79891 Long term (current) use of opiate analgesic: Secondary | ICD-10-CM | POA: Diagnosis not present

## 2017-08-07 DIAGNOSIS — M546 Pain in thoracic spine: Secondary | ICD-10-CM | POA: Diagnosis not present

## 2017-10-02 DIAGNOSIS — M40204 Unspecified kyphosis, thoracic region: Secondary | ICD-10-CM | POA: Diagnosis not present

## 2017-10-02 DIAGNOSIS — Z79891 Long term (current) use of opiate analgesic: Secondary | ICD-10-CM | POA: Diagnosis not present

## 2017-10-02 DIAGNOSIS — M546 Pain in thoracic spine: Secondary | ICD-10-CM | POA: Diagnosis not present

## 2017-10-02 DIAGNOSIS — G894 Chronic pain syndrome: Secondary | ICD-10-CM | POA: Diagnosis not present

## 2017-10-13 DIAGNOSIS — K219 Gastro-esophageal reflux disease without esophagitis: Secondary | ICD-10-CM | POA: Diagnosis not present

## 2017-10-13 DIAGNOSIS — R0602 Shortness of breath: Secondary | ICD-10-CM | POA: Diagnosis not present

## 2017-10-13 DIAGNOSIS — J22 Unspecified acute lower respiratory infection: Secondary | ICD-10-CM | POA: Diagnosis not present

## 2017-11-12 ENCOUNTER — Other Ambulatory Visit: Payer: Self-pay

## 2017-11-12 DIAGNOSIS — E349 Endocrine disorder, unspecified: Secondary | ICD-10-CM

## 2017-11-12 MED ORDER — TESTOSTERONE 10 MG/ACT (2%) TD GEL: TRANSDERMAL | 1 refills | Status: DC

## 2017-11-12 NOTE — Telephone Encounter (Signed)
Testosterone refill request. PMP checked, last filled on 03/02/17. Last office visit on 07/31/17. Next office visit on 11/14/17. In que for your signature.

## 2017-11-13 ENCOUNTER — Telehealth: Payer: Self-pay

## 2017-11-13 NOTE — Telephone Encounter (Signed)
Pharmacy needs new rx written for patient's testosterone. Rx written on script pad and faxed to pharmacy. Copy to be scanned into patient's chart for future ordering.

## 2017-11-14 ENCOUNTER — Ambulatory Visit: Payer: Self-pay | Admitting: Adult Health

## 2017-11-30 DIAGNOSIS — M546 Pain in thoracic spine: Secondary | ICD-10-CM | POA: Diagnosis not present

## 2017-11-30 DIAGNOSIS — Z79891 Long term (current) use of opiate analgesic: Secondary | ICD-10-CM | POA: Diagnosis not present

## 2017-11-30 DIAGNOSIS — M40204 Unspecified kyphosis, thoracic region: Secondary | ICD-10-CM | POA: Diagnosis not present

## 2017-11-30 DIAGNOSIS — G894 Chronic pain syndrome: Secondary | ICD-10-CM | POA: Diagnosis not present

## 2018-01-18 ENCOUNTER — Encounter (HOSPITAL_BASED_OUTPATIENT_CLINIC_OR_DEPARTMENT_OTHER): Payer: Medicare Other | Attending: Internal Medicine

## 2018-01-18 DIAGNOSIS — E11622 Type 2 diabetes mellitus with other skin ulcer: Secondary | ICD-10-CM | POA: Diagnosis not present

## 2018-01-18 DIAGNOSIS — I872 Venous insufficiency (chronic) (peripheral): Secondary | ICD-10-CM | POA: Diagnosis not present

## 2018-01-18 DIAGNOSIS — I890 Lymphedema, not elsewhere classified: Secondary | ICD-10-CM | POA: Insufficient documentation

## 2018-01-18 DIAGNOSIS — L97322 Non-pressure chronic ulcer of left ankle with fat layer exposed: Secondary | ICD-10-CM | POA: Insufficient documentation

## 2018-01-18 DIAGNOSIS — Z87891 Personal history of nicotine dependence: Secondary | ICD-10-CM | POA: Diagnosis not present

## 2018-01-18 DIAGNOSIS — I10 Essential (primary) hypertension: Secondary | ICD-10-CM | POA: Insufficient documentation

## 2018-01-29 DIAGNOSIS — M546 Pain in thoracic spine: Secondary | ICD-10-CM | POA: Diagnosis not present

## 2018-01-29 DIAGNOSIS — G894 Chronic pain syndrome: Secondary | ICD-10-CM | POA: Diagnosis not present

## 2018-01-29 DIAGNOSIS — M40204 Unspecified kyphosis, thoracic region: Secondary | ICD-10-CM | POA: Diagnosis not present

## 2018-01-29 DIAGNOSIS — Z79891 Long term (current) use of opiate analgesic: Secondary | ICD-10-CM | POA: Diagnosis not present

## 2018-01-30 DIAGNOSIS — I10 Essential (primary) hypertension: Secondary | ICD-10-CM | POA: Diagnosis not present

## 2018-01-30 DIAGNOSIS — I890 Lymphedema, not elsewhere classified: Secondary | ICD-10-CM | POA: Diagnosis not present

## 2018-01-30 DIAGNOSIS — L97322 Non-pressure chronic ulcer of left ankle with fat layer exposed: Secondary | ICD-10-CM | POA: Diagnosis not present

## 2018-01-30 DIAGNOSIS — Z87891 Personal history of nicotine dependence: Secondary | ICD-10-CM | POA: Diagnosis not present

## 2018-01-30 DIAGNOSIS — E11622 Type 2 diabetes mellitus with other skin ulcer: Secondary | ICD-10-CM | POA: Diagnosis not present

## 2018-01-30 DIAGNOSIS — I872 Venous insufficiency (chronic) (peripheral): Secondary | ICD-10-CM | POA: Diagnosis not present

## 2018-02-05 ENCOUNTER — Encounter (HOSPITAL_BASED_OUTPATIENT_CLINIC_OR_DEPARTMENT_OTHER): Payer: Medicare Other

## 2018-02-13 ENCOUNTER — Encounter (HOSPITAL_BASED_OUTPATIENT_CLINIC_OR_DEPARTMENT_OTHER): Payer: Medicare Other | Attending: Physician Assistant

## 2018-02-13 DIAGNOSIS — L97322 Non-pressure chronic ulcer of left ankle with fat layer exposed: Secondary | ICD-10-CM | POA: Insufficient documentation

## 2018-02-13 DIAGNOSIS — I10 Essential (primary) hypertension: Secondary | ICD-10-CM | POA: Diagnosis not present

## 2018-02-13 DIAGNOSIS — E11622 Type 2 diabetes mellitus with other skin ulcer: Secondary | ICD-10-CM | POA: Diagnosis not present

## 2018-02-13 DIAGNOSIS — I87312 Chronic venous hypertension (idiopathic) with ulcer of left lower extremity: Secondary | ICD-10-CM | POA: Diagnosis not present

## 2018-02-19 ENCOUNTER — Ambulatory Visit: Payer: Self-pay | Admitting: Internal Medicine

## 2018-02-27 DIAGNOSIS — I10 Essential (primary) hypertension: Secondary | ICD-10-CM | POA: Diagnosis not present

## 2018-02-27 DIAGNOSIS — I872 Venous insufficiency (chronic) (peripheral): Secondary | ICD-10-CM | POA: Diagnosis not present

## 2018-02-27 DIAGNOSIS — E11622 Type 2 diabetes mellitus with other skin ulcer: Secondary | ICD-10-CM | POA: Diagnosis not present

## 2018-02-27 DIAGNOSIS — L97322 Non-pressure chronic ulcer of left ankle with fat layer exposed: Secondary | ICD-10-CM | POA: Diagnosis not present

## 2018-03-06 DIAGNOSIS — I87312 Chronic venous hypertension (idiopathic) with ulcer of left lower extremity: Secondary | ICD-10-CM | POA: Diagnosis not present

## 2018-03-06 DIAGNOSIS — L97322 Non-pressure chronic ulcer of left ankle with fat layer exposed: Secondary | ICD-10-CM | POA: Diagnosis not present

## 2018-03-06 DIAGNOSIS — I10 Essential (primary) hypertension: Secondary | ICD-10-CM | POA: Diagnosis not present

## 2018-03-06 DIAGNOSIS — E11622 Type 2 diabetes mellitus with other skin ulcer: Secondary | ICD-10-CM | POA: Diagnosis not present

## 2018-03-13 ENCOUNTER — Encounter (HOSPITAL_BASED_OUTPATIENT_CLINIC_OR_DEPARTMENT_OTHER): Payer: Medicare Other | Attending: Physician Assistant

## 2018-03-13 DIAGNOSIS — I872 Venous insufficiency (chronic) (peripheral): Secondary | ICD-10-CM | POA: Insufficient documentation

## 2018-03-13 DIAGNOSIS — E11622 Type 2 diabetes mellitus with other skin ulcer: Secondary | ICD-10-CM | POA: Insufficient documentation

## 2018-03-13 DIAGNOSIS — I10 Essential (primary) hypertension: Secondary | ICD-10-CM | POA: Insufficient documentation

## 2018-03-13 DIAGNOSIS — L97322 Non-pressure chronic ulcer of left ankle with fat layer exposed: Secondary | ICD-10-CM | POA: Insufficient documentation

## 2018-03-13 DIAGNOSIS — Z87891 Personal history of nicotine dependence: Secondary | ICD-10-CM | POA: Insufficient documentation

## 2018-03-20 DIAGNOSIS — Z23 Encounter for immunization: Secondary | ICD-10-CM | POA: Diagnosis not present

## 2018-03-27 DIAGNOSIS — Z87891 Personal history of nicotine dependence: Secondary | ICD-10-CM | POA: Diagnosis not present

## 2018-03-27 DIAGNOSIS — I10 Essential (primary) hypertension: Secondary | ICD-10-CM | POA: Diagnosis not present

## 2018-03-27 DIAGNOSIS — E11622 Type 2 diabetes mellitus with other skin ulcer: Secondary | ICD-10-CM | POA: Diagnosis not present

## 2018-03-27 DIAGNOSIS — L97322 Non-pressure chronic ulcer of left ankle with fat layer exposed: Secondary | ICD-10-CM | POA: Diagnosis not present

## 2018-03-27 DIAGNOSIS — I872 Venous insufficiency (chronic) (peripheral): Secondary | ICD-10-CM | POA: Diagnosis not present

## 2018-04-10 DIAGNOSIS — I872 Venous insufficiency (chronic) (peripheral): Secondary | ICD-10-CM | POA: Diagnosis not present

## 2018-04-10 DIAGNOSIS — I10 Essential (primary) hypertension: Secondary | ICD-10-CM | POA: Diagnosis not present

## 2018-04-10 DIAGNOSIS — Z87891 Personal history of nicotine dependence: Secondary | ICD-10-CM | POA: Diagnosis not present

## 2018-04-10 DIAGNOSIS — E11622 Type 2 diabetes mellitus with other skin ulcer: Secondary | ICD-10-CM | POA: Diagnosis not present

## 2018-04-10 DIAGNOSIS — L97322 Non-pressure chronic ulcer of left ankle with fat layer exposed: Secondary | ICD-10-CM | POA: Diagnosis not present

## 2018-04-18 DIAGNOSIS — Z79891 Long term (current) use of opiate analgesic: Secondary | ICD-10-CM | POA: Diagnosis not present

## 2018-04-18 DIAGNOSIS — M546 Pain in thoracic spine: Secondary | ICD-10-CM | POA: Diagnosis not present

## 2018-04-18 DIAGNOSIS — M40204 Unspecified kyphosis, thoracic region: Secondary | ICD-10-CM | POA: Diagnosis not present

## 2018-04-18 DIAGNOSIS — G894 Chronic pain syndrome: Secondary | ICD-10-CM | POA: Diagnosis not present

## 2018-04-24 ENCOUNTER — Encounter (HOSPITAL_BASED_OUTPATIENT_CLINIC_OR_DEPARTMENT_OTHER): Payer: Medicare Other | Attending: Physician Assistant

## 2018-04-24 DIAGNOSIS — Z87891 Personal history of nicotine dependence: Secondary | ICD-10-CM | POA: Insufficient documentation

## 2018-04-24 DIAGNOSIS — L97329 Non-pressure chronic ulcer of left ankle with unspecified severity: Secondary | ICD-10-CM | POA: Diagnosis not present

## 2018-04-24 DIAGNOSIS — Z96659 Presence of unspecified artificial knee joint: Secondary | ICD-10-CM | POA: Diagnosis not present

## 2018-04-24 DIAGNOSIS — I10 Essential (primary) hypertension: Secondary | ICD-10-CM | POA: Insufficient documentation

## 2018-04-24 DIAGNOSIS — Z09 Encounter for follow-up examination after completed treatment for conditions other than malignant neoplasm: Secondary | ICD-10-CM | POA: Insufficient documentation

## 2018-04-24 DIAGNOSIS — I872 Venous insufficiency (chronic) (peripheral): Secondary | ICD-10-CM | POA: Insufficient documentation

## 2018-04-24 DIAGNOSIS — E119 Type 2 diabetes mellitus without complications: Secondary | ICD-10-CM | POA: Insufficient documentation

## 2018-04-24 DIAGNOSIS — Z8631 Personal history of diabetic foot ulcer: Secondary | ICD-10-CM | POA: Diagnosis not present

## 2018-06-13 DIAGNOSIS — Z79891 Long term (current) use of opiate analgesic: Secondary | ICD-10-CM | POA: Diagnosis not present

## 2018-06-13 DIAGNOSIS — M546 Pain in thoracic spine: Secondary | ICD-10-CM | POA: Diagnosis not present

## 2018-06-13 DIAGNOSIS — G894 Chronic pain syndrome: Secondary | ICD-10-CM | POA: Diagnosis not present

## 2018-06-13 DIAGNOSIS — M40204 Unspecified kyphosis, thoracic region: Secondary | ICD-10-CM | POA: Diagnosis not present

## 2018-08-13 DIAGNOSIS — M40204 Unspecified kyphosis, thoracic region: Secondary | ICD-10-CM | POA: Diagnosis not present

## 2018-08-13 DIAGNOSIS — M546 Pain in thoracic spine: Secondary | ICD-10-CM | POA: Diagnosis not present

## 2018-08-13 DIAGNOSIS — G894 Chronic pain syndrome: Secondary | ICD-10-CM | POA: Diagnosis not present

## 2018-08-13 DIAGNOSIS — Z79891 Long term (current) use of opiate analgesic: Secondary | ICD-10-CM | POA: Diagnosis not present

## 2018-09-04 ENCOUNTER — Encounter: Payer: Self-pay | Admitting: Internal Medicine

## 2018-10-08 DIAGNOSIS — M546 Pain in thoracic spine: Secondary | ICD-10-CM | POA: Diagnosis not present

## 2018-10-08 DIAGNOSIS — Z79891 Long term (current) use of opiate analgesic: Secondary | ICD-10-CM | POA: Diagnosis not present

## 2018-10-08 DIAGNOSIS — M40204 Unspecified kyphosis, thoracic region: Secondary | ICD-10-CM | POA: Diagnosis not present

## 2018-10-08 DIAGNOSIS — G894 Chronic pain syndrome: Secondary | ICD-10-CM | POA: Diagnosis not present

## 2018-11-28 ENCOUNTER — Encounter: Payer: Self-pay | Admitting: Internal Medicine

## 2018-12-03 DIAGNOSIS — M546 Pain in thoracic spine: Secondary | ICD-10-CM | POA: Diagnosis not present

## 2018-12-03 DIAGNOSIS — Z79891 Long term (current) use of opiate analgesic: Secondary | ICD-10-CM | POA: Diagnosis not present

## 2018-12-03 DIAGNOSIS — G894 Chronic pain syndrome: Secondary | ICD-10-CM | POA: Diagnosis not present

## 2018-12-03 DIAGNOSIS — M40204 Unspecified kyphosis, thoracic region: Secondary | ICD-10-CM | POA: Diagnosis not present

## 2019-01-01 ENCOUNTER — Telehealth: Payer: Self-pay

## 2019-01-01 DIAGNOSIS — E349 Endocrine disorder, unspecified: Secondary | ICD-10-CM

## 2019-01-01 NOTE — Telephone Encounter (Signed)
Testosterone refill request. Pharmacy needs new rx written for patient's testosterone. Rx written on script pad and faxed to pharmacy. Copy to be scanned into patient's chart for future ordering.

## 2019-02-04 DIAGNOSIS — M40204 Unspecified kyphosis, thoracic region: Secondary | ICD-10-CM | POA: Diagnosis not present

## 2019-02-04 DIAGNOSIS — M546 Pain in thoracic spine: Secondary | ICD-10-CM | POA: Diagnosis not present

## 2019-02-04 DIAGNOSIS — G894 Chronic pain syndrome: Secondary | ICD-10-CM | POA: Diagnosis not present

## 2019-02-04 DIAGNOSIS — Z79891 Long term (current) use of opiate analgesic: Secondary | ICD-10-CM | POA: Diagnosis not present

## 2019-02-14 DIAGNOSIS — Z23 Encounter for immunization: Secondary | ICD-10-CM | POA: Diagnosis not present

## 2019-02-18 ENCOUNTER — Encounter: Payer: Medicare Other | Admitting: Physician Assistant

## 2019-03-10 DIAGNOSIS — H40023 Open angle with borderline findings, high risk, bilateral: Secondary | ICD-10-CM | POA: Diagnosis not present

## 2019-03-10 DIAGNOSIS — H35363 Drusen (degenerative) of macula, bilateral: Secondary | ICD-10-CM | POA: Diagnosis not present

## 2019-03-10 NOTE — Progress Notes (Signed)
MEDICARE ANNUAL WELLNESS VISIT AND CPE  Assessment:    Essential hypertension - continue medications, DASH diet, exercise and monitor at home. Call if greater than 130/80.  - add on lasix 40 mg to take as needed -     CBC with Differential/Platelet -     Hepatic function panel -     BASIC METABOLIC PANEL WITH GFR  Chronic obstructive pulmonary disease, unspecified COPD type ([place]) Avoid triggers, symptoms stable at this time Get CXR, last one 15 years ago  Gastroesophageal reflux disease, esophagitis presence not specified Continue PPI/H2 blocker, diet discussed  Celiac disease Continue GI followup, diet controlled  Hypothyroidism, unspecified type Has been on OTC medication that is not working, has been having  -     TSH  DDD (degenerative disc disease), lumbar Continue pain management  Arthritis Continue pain management   CKD (chronic kidney disease) stage 2, GFR 60-89 ml/min Increase fluids, avoid NSAIDS, monitor sugars, will monitor -     Hepatic function panel  Abnormal glucose Discussed general issues about diabetes pathophysiology and management., Educational material distributed., Suggested low cholesterol diet., Encouraged aerobic exercise., Discussed foot care., Reminded to get yearly retinal exam. -     Hemoglobin A1c  Vitamin D deficiency Continue supplement  Vitamin B12 deficiency Check B12 next OV  Mixed hyperlipidemia -continue medications, check lipids, decrease fatty foods, increase activity.  -     Lipid panel  Left-sided low back pain with sciatica, sciatica laterality unspecified, unspecified chronicity Continue pain managament  Chronic pain syndrome Continue pain managament  Medication management -     Magnesium  Testosterone deficiency  check testosterone levels as needed.   Venous insuff.  Follow up Dr. Donnetta Hutching as needed Wear compression socks and elevate.  Will add on low dose lasix to take as needed, follow up 3  months  Encounter for Medicare annual wellness exam 1 year  Over 40 minutes of exam, counseling, chart review and critical decision making was performed  Future Appointments  Date Time Provider [place]  03/11/2020  3:00 PM Vicie Mutters, PA-C [id number] None     Plan:   During the course of the visit the patient was educated and counseled about appropriate screening and preventive services including:    Pneumococcal vaccine  Prevnar 13   Influenza vaccine  Td vaccine  Screening electrocardiogram  Bone densitometry screening  Colorectal cancer screening  Diabetes screening  Glaucoma screening  Nutrition counseling   Advanced directives: requested   Subjective:  [name] Fernandez is a 78 y.o. male who presents for Medicare Annual Wellness Visit and follow up   His blood pressure has been controlled at home, today their BP is BP: 122/80  He does not workout due to pain/swelling in his legs. He denies chest pain, shortness of breath, dizziness.  He is not on cholesterol medication and denies myalgias. His cholesterol is at goal. The cholesterol last visit was:   Lab Results  Component Value Date   CHOL 194 07/31/2017   HDL 34 (L) 07/31/2017   LDLCALC 136 (H) 07/31/2017   TRIG 122 07/31/2017   CHOLHDL 5.7 (H) 07/31/2017    He has been working on diet and exercise for prediabetes, and denies paresthesia of the feet, polydipsia, polyuria and visual disturbances. Last A1C in the office was:  Lab Results  Component Value Date   HGBA1C 6.2 (H) 07/31/2017   Patient is on Vitamin D supplement.   Lab Results  Component Value Date   VD25OH  53 07/31/2017     BMI is Body mass index is 30.41 kg/m., he is working on diet and exercise. Will have some SOB with exertion, unchanged, no cough or wheezing with it. No CP or other accompaniments with it.  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 Hardin Negus for pain management, on oxycodone 5 mg 1-3 x a day.   He has had venous ulcer left medial leg, following with wound center, putting medihoney and providine. Use compression socks but occ have to not wear them due to itching/irritation.  He was on zaroxyln at one point but since he has cut back on his oxycodone    Lab Results  Component Value Date   GFRNONAA 76 07/31/2017   He is on OTC thyroid medication. His medication was changed last visit. Lab Results  Component Value Date   TSH 3.05 07/31/2017  .  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 He states that the testosterone helps with his energy, libido, muscle mass. Lab Results  Component Value Date   TESTOSTERONE 417 07/31/2017     Medication Review: Current Outpatient Medications on File Prior to Visit  Medication Sig Dispense Refill  . Calcium Carbonate-Vit D-Min (CALCIUM 1200 PO) Take by mouth daily.    . Cholecalciferol (VITAMIN D PO) Take 5,000 Int'l Units by mouth daily.    . Cyanocobalamin (VITAMIN B 12 PO) Take 2,500 mcg by mouth 2 (two) times daily.    Marland Kitchen MAGNESIUM PO Take 500 mg by mouth 2 (two) times daily.     . metolazone (ZAROXOLYN) 5 MG tablet Take 1/2 to 1 tablet daily or as directed for fluid & swelling 90 tablet 1  . MILK THISTLE PO Take 240 mg by mouth daily.    . Omega-3 Fatty Acids (OMEGA-3 FISH OIL PO) Take by mouth daily.    Marland Kitchen omeprazole (PRILOSEC) 20 MG capsule Take 1 capsule (20 mg total) by mouth daily. 90 capsule 3  . OVER THE COUNTER MEDICATION Thyroid complex 2 tabs daily.    Marland Kitchen OVER THE COUNTER MEDICATION Kelp with Iodine 1 daily    . OVER THE COUNTER MEDICATION Tumeric Curcumin 500 mg 2 times daily    . OVER THE COUNTER MEDICATION Chlor oxygen red blood builder 500 mg 2 capsules daily.    Marland Kitchen oxyCODONE (OXY  IR/ROXICODONE) 5 MG immediate release tablet TAKE 1 TABLET EVERY 4 HOURS AS NEEDED  0  . Red Yeast Rice 600 MG CAPS Take 600 mg by mouth 3 (three) times daily.    . rosuvastatin (CRESTOR) 40 MG tablet Take 1/2 to 1 tablet daily or as directed for Cholesterol 30 tablet 5  . Sennosides (SENOKOT PO) Take by mouth as needed.    . terbinafine (LAMISIL) 1 % cream Apply 1 application topically daily. For 4 weeks 42 g 3  . Testosterone 10 MG/ACT (2%) GEL Place 2 ML/mg topically each day 120 g 1   No current facility-administered medications on file prior to visit.     Allergies: Allergies  Allergen Reactions  . Feraheme [Ferumoxytol] Other (See Comments)    Swelling of lips and tongue, could not talk 30 minutes after the infusion.  . Naprosyn [Naproxen] Swelling  Unsure if naprosyn caused this reaction  . Latex   . Levothyroxine     Lip swelling    Current Problems (verified) Patient Active Problem List   Diagnosis Date Noted  . CKD (chronic kidney disease) stage 2, 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/2015  . COPD (chronic obstructive pulmonary disease) ([place]) 12/08/2015  . Chronic pain syndrome 05/26/2015  . DDD (degenerative disc disease), lumbar 05/26/2015  . Medication management 05/26/2015  . 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 Lumbago with sciatica 08/06/2014  . Hyperlipidemia 08/20/2013  . Hypertension   . Abnormal glucose   . Vitamin D deficiency   . Arthritis   . Vitamin B12 deficiency   . Celiac disease 06/19/2012  . GERD (gastroesophageal reflux disease) 04/30/2012  . 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 Hypothyroidism 04/30/2012  . 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 DT 01/12/2011  . Fluad Quad(high Dose 65+) 02/14/2019  . Influenza, High Dose Seasonal PF 05/20/2014, 05/26/2015, 03/20/2018  . Influenza,inj,Quad PF,6+ Mos 03/20/2018  . Influenza-Unspecified  04/30/2013, 03/29/2016, 03/02/2017  . Pneumococcal Conjugate-13 05/20/2014  . Pneumococcal Polysaccharide-23 03/29/2016  . Pneumococcal-Unspecified 05/20/2008  . Zoster 07/15/2014   Tetanus: 2012  Pneumovax: 2017 Prevnar 13: 2015 Flu vaccine: 2020 Zostavax: 2016  Colonoscopy: 05/2012 Dr. Hilarie Fredrickson  EGD: 05/2012  Names of Other Physician/Practitioners you currently use: 1. [facility] here for primary care 2. Lindalou Hose, eye doctor, last visit 07/2016 Patient Care Team: Unk Pinto, MD as PCP - General (Internal Medicine) Nicholaus Bloom, MD as Consulting Physician (Anesthesiology) Hilarie Fredrickson, Lajuan Lines, MD as Consulting Physician (Gastroenterology)  Surgical:  has a past surgical history that includes Joint replacement (04/2010); Back surgery; Tonsillectomy; Eye surgery; Hiatal hernia repair; Hernia repair (1950's); and Total knee revision (11/20/2011). Family His family history includes Esophageal cancer in his father. Social history  He reports that he quit smoking about 38 years ago. His smoking use included cigarettes. He has a 20.00 pack-year smoking history. He has never used smokeless tobacco. He reports that he does not drink alcohol or use drugs.  MEDICARE WELLNESS OBJECTIVES: Physical activity:   Cardiac risk factors:   Depression/mood screen:   Depression screen [facility] 2/9 08/05/2017  Decreased Interest 0  Down, Depressed, Hopeless 0  PHQ - 2 Score 0  Altered sleeping -  Tired, decreased energy -  Change in appetite -  Feeling bad or failure about yourself  -  Trouble concentrating -  Moving slowly or fidgety/restless -  Suicidal thoughts -  PHQ-9 Score -    ADLs:  No flowsheet data found.  Cognitive Testing  Alert? Yes  Normal Appearance?Yes  Oriented to person? Yes  Place? Yes   Time? Yes  Recall of three objects?  2/3  Can perform simple calculations? Yes  Displays appropriate judgment?Yes  Can read the correct time from  a watch face?Yes  EOL planning: Does Patient Have a Medical Advance Directive?: Yes Type of Advance Directive: Healthcare Power of Attorney, Living will Copy of [place] in Chart?: No - copy requested  Review of Systems  Constitutional: Negative.   HENT: Negative for congestion, ear discharge, ear pain, hearing loss, nosebleeds, sore throat and tinnitus.   Eyes: Negative.   Respiratory: Negative for cough, hemoptysis, sputum production, shortness of breath, wheezing and stridor.   Cardiovascular: Positive for leg swelling. Negative for chest pain, palpitations, orthopnea, claudication and PND.  Gastrointestinal: Negative for abdominal  pain, blood in stool, constipation, diarrhea, heartburn, melena, nausea and vomiting.  Genitourinary: Negative for dysuria, flank pain, frequency, hematuria and urgency.  Musculoskeletal: Positive for back pain, joint pain and myalgias. Negative for falls and neck pain.  Skin: Negative for itching and rash.  Neurological: Negative for dizziness, tingling, tremors, sensory change, speech change, focal weakness, seizures, loss of consciousness and headaches.  Endo/Heme/Allergies: Negative.   Psychiatric/Behavioral: Negative.      Objective:     Today's Vitals   03/12/19 1352  BP: 122/80  Pulse: 94  Temp: (!) 97.5 F (36.4 C)  SpO2: 99%  Weight: 188 lb 6.4 oz (85.5 kg)  Height: 5' 6"  (1.676 m)  PainSc: 4   PainLoc: Knee   Body mass index is 30.41 kg/m.  General appearance: alert, no distress, WD/WN, male HEENT: normocephalic, sclerae anicteric, TMs pearly, nares patent, no discharge or erythema, pharynx normal Oral cavity: MMM, no lesions Neck: supple, no lymphadenopathy, no thyromegaly, no masses Heart: RRR, normal S1, S2, no murmurs Lungs: CTA bilaterally, no wheezes, rhonchi, or rales Abdomen: +bs, soft, non tender, non distended, no masses, no hepatomegaly, no splenomegaly Musculoskeletal: nontender, no swelling, no  obvious deformity Extremities: 2+ edema, left lower medial leg with erythema, no visible ulcer or break down, no cyanosis, no clubbing Pulses: 2+ symmetric, upper and lower extremities, normal cap refill Neurological: alert, oriented x 3, CN2-12 intact, strength normal upper extremities and lower extremities,  DTRs 2+ throughout, no cerebellar signs, gait Normal and Antalgic Psychiatric: normal affect, behavior normal, pleasant   Medicare Attestation I have personally reviewed: The patient's medical and social history Their use of alcohol, tobacco or illicit drugs Their current medications and supplements The patient's functional ability including ADLs,fall risks, home safety risks, cognitive, and hearing and visual impairment Diet and physical activities Evidence for depression or mood disorders  The patient's weight, height, BMI, and visual acuity have been recorded in the chart.  I have made referrals, counseling, and provided education to the patient based on review of the above and I have provided the patient with a written personalized care plan for preventive services.     Vicie Mutters, PA-C   03/12/2019

## 2019-03-12 ENCOUNTER — Ambulatory Visit (INDEPENDENT_AMBULATORY_CARE_PROVIDER_SITE_OTHER): Payer: Medicare Other | Admitting: Physician Assistant

## 2019-03-12 ENCOUNTER — Other Ambulatory Visit: Payer: Self-pay

## 2019-03-12 ENCOUNTER — Encounter: Payer: Self-pay | Admitting: Physician Assistant

## 2019-03-12 VITALS — BP 122/80 | HR 94 | Temp 97.5°F | Ht 66.0 in | Wt 188.4 lb

## 2019-03-12 DIAGNOSIS — M5136 Other intervertebral disc degeneration, lumbar region: Secondary | ICD-10-CM

## 2019-03-12 DIAGNOSIS — E538 Deficiency of other specified B group vitamins: Secondary | ICD-10-CM | POA: Diagnosis not present

## 2019-03-12 DIAGNOSIS — M51369 Other intervertebral disc degeneration, lumbar region without mention of lumbar back pain or lower extremity pain: Secondary | ICD-10-CM

## 2019-03-12 DIAGNOSIS — Z0001 Encounter for general adult medical examination with abnormal findings: Secondary | ICD-10-CM

## 2019-03-12 DIAGNOSIS — Z9889 Other specified postprocedural states: Secondary | ICD-10-CM | POA: Diagnosis not present

## 2019-03-12 DIAGNOSIS — N182 Chronic kidney disease, stage 2 (mild): Secondary | ICD-10-CM

## 2019-03-12 DIAGNOSIS — E559 Vitamin D deficiency, unspecified: Secondary | ICD-10-CM

## 2019-03-12 DIAGNOSIS — E039 Hypothyroidism, unspecified: Secondary | ICD-10-CM

## 2019-03-12 DIAGNOSIS — Z79899 Other long term (current) drug therapy: Secondary | ICD-10-CM | POA: Diagnosis not present

## 2019-03-12 DIAGNOSIS — R6889 Other general symptoms and signs: Secondary | ICD-10-CM

## 2019-03-12 DIAGNOSIS — D649 Anemia, unspecified: Secondary | ICD-10-CM

## 2019-03-12 DIAGNOSIS — E782 Mixed hyperlipidemia: Secondary | ICD-10-CM

## 2019-03-12 DIAGNOSIS — G894 Chronic pain syndrome: Secondary | ICD-10-CM | POA: Diagnosis not present

## 2019-03-12 DIAGNOSIS — M1990 Unspecified osteoarthritis, unspecified site: Secondary | ICD-10-CM

## 2019-03-12 DIAGNOSIS — Z96659 Presence of unspecified artificial knee joint: Secondary | ICD-10-CM

## 2019-03-12 DIAGNOSIS — E349 Endocrine disorder, unspecified: Secondary | ICD-10-CM

## 2019-03-12 DIAGNOSIS — K900 Celiac disease: Secondary | ICD-10-CM

## 2019-03-12 DIAGNOSIS — I872 Venous insufficiency (chronic) (peripheral): Secondary | ICD-10-CM

## 2019-03-12 DIAGNOSIS — R7309 Other abnormal glucose: Secondary | ICD-10-CM | POA: Diagnosis not present

## 2019-03-12 DIAGNOSIS — I10 Essential (primary) hypertension: Secondary | ICD-10-CM

## 2019-03-12 DIAGNOSIS — J449 Chronic obstructive pulmonary disease, unspecified: Secondary | ICD-10-CM

## 2019-03-12 DIAGNOSIS — R609 Edema, unspecified: Secondary | ICD-10-CM

## 2019-03-12 DIAGNOSIS — Z0000 Encounter for general adult medical examination without abnormal findings: Secondary | ICD-10-CM

## 2019-03-12 MED ORDER — CLOTRIMAZOLE-BETAMETHASONE 1-0.05 % EX CREA: 1.0000 "application " | TOPICAL_CREAM | Freq: Two times a day (BID) | CUTANEOUS | 2 refills | Status: DC

## 2019-03-12 MED ORDER — FUROSEMIDE 40 MG PO TABS: 40.0000 mg | ORAL_TABLET | Freq: Every day | ORAL | 1 refills | Status: DC

## 2019-03-12 NOTE — Patient Instructions (Addendum)
I'm going to give you Lasix- can take once a day AS needed for swelling.  Can try daily to see how you do or taking AS NEEDED This is a fluid pill that makes you pee more often, take it in the morning  If can make you pee more for about 6 hours If can deplete your magnesium and potassium so if you get leg cramps let me know and we may need to replace this Monitor your weight and blood pressure while on it If you get dizzy while on it stop the medication and call me Any questions or concerns stop the medication and call the office.   Make sure none of the supplements you are on have biotin in it.  It can cause false numbers with your thyroid.   Use a dropper or use a cap to put peroxide, olive oil,mineral oil or canola oil in the effected ear- 2-3 times a week. Let it soak for 20-30 min then you can take a shower or use a baby bulb with warm water to wash out the ear wax.  Can buy debrox wax removal kit over the counter.  Do not use Qtips  INFORMATION ABOUT YOUR XRAY  Can walk into [address] for an Insurance account manager. They will have the order and take you back. You do not any paper work, I should get the result back today or tomorrow. This order is good for a year.  Can call [phone] to schedule an appointment if you wish.      VENOUS INSUFFICIENCY Our lower leg venous system is not the most reliable, the heart does NOT pump fluid up, there is a valve system.  The muscles of the leg squeeze and the blood moves up and a valve opens and close, then they squeeze, blood moves up and valves open and closes keeping the blood moving towards the heart.  Lots can go wrong with this valve system.  If someone is sitting or standing without movement, everyone will get swelling.  THINGS TO DO:  Do not stand or sit in one position for long periods of time. Do not sit with your legs crossed. Rest with your legs raised during the day.  Your legs have to be higher than your heart so that gravity  will force the valves to open, so please really elevate your legs.   Wear elastic stockings or support hose. Do not wear other tight, encircling garments around the legs, pelvis, or waist.  [employer]  has a wide variety of well priced compression stockings. [place], [place] [id number] [id number] [place] has a good cheap selection, I like the socks, they are not as hard to get on  Walk as much as possible to increase blood flow.  Raise the foot of your bed at night with 2-inch blocks.  SEEK MEDICAL CARE IF:   The skin around your ankle starts to break down.  You have pain, redness, tenderness, or hard swelling developing in your leg over a vein.  You are uncomfortable due to leg pain.  If you ever have shortness of breath with exertion or chest pain go to the ER.  Here is some information to help you keep your heart healthy: Move it! - Aim for 30 mins of activity every day. Take it slowly at first. Talk to [place] before starting any new exercise program.   Lose it.  -Body Mass Index (BMI) can indicate if you need  to lose weight. A healthy range is 18.5-24.9. For a BMI calculator, go to [employer]  Waist Management -Excess abdominal fat is a risk factor for heart disease, diabetes, asthma, stroke and more. Ideal waist circumference is less than 35" for women and less than 40" for men.   Eat Right -focus on fruits, vegetables, whole grains, and meals you make yourself. Avoid foods with trans fat and high sugar/sodium content.   Snooze or Snore? - Loud snoring can be a sign of sleep apnea, a significant risk factor for high blood pressure, heart attach, stroke, and heart arrhythmias.  Kick the habit -Quit Smoking! Avoid second hand smoke. A single cigarette raises your blood pressure for 20 mins and increases the risk of heart attack and stroke for the next 24 hours.   Are Aspirin and Supplements right for you? -Add ENTERIC COATED low dose 81 mg  Aspirin daily OR can do every other day if you have easy bruising to protect your heart and head. As well as to reduce risk of Colon Cancer by 20 %, Skin Cancer by 26 % , Melanoma by 46% and Pancreatic cancer by 60%  Say "No to Stress -There may be little you can do about problems that cause stress. However, techniques such as long walks, meditation, and exercise can help you manage it.   Start Now! - Make changes one at a time and set reasonable goals to increase your likelihood of success.    Ask insurance and pharmacy about shingrix - new vaccine   Can go to [email or web address] for more information  Shingrix Vaccination  Two vaccines are licensed and recommended to prevent shingles in the [place] Zoster vaccine live (ZVL, Zostavax) has been in use since 2006. Recombinant zoster vaccine (RZV, Shingrix), has been in use since 2017 and is recommended by ACIP as the preferred shingles vaccine.  What Everyone Should Know about Shingles Vaccine (Shingrix) One of the Recommended Vaccines by Disease Shingles vaccination is the only way to protect against shingles and postherpetic neuralgia (PHN), the most common complication from shingles. CDC recommends that healthy adults 50 years and older get two doses of the shingles vaccine called Shingrix (recombinant zoster vaccine), separated by 2 to 6 months, to prevent shingles and the complications from the disease. Your doctor or pharmacist can give you Shingrix as a shot in your upper arm. Shingrix provides strong protection against shingles and PHN. Two doses of Shingrix is more than 90% effective at preventing shingles and PHN. Protection stays above 85% for at least the first four years after you get vaccinated. Shingrix is the preferred vaccine, over Zostavax (zoster vaccine live), a shingles vaccine in use since 2006. Zostavax may still be used to prevent shingles in healthy adults 60 years and older.  For example, you could use Zostavax if a person is allergic to Shingrix, prefers Zostavax, or requests immediate vaccination and Shingrix is unavailable. Who Should Get Shingrix? Healthy adults 50 years and older should get two doses of Shingrix, separated by 2 to 6 months. You should get Shingrix even if in the past you . had shingles  . received Zostavax  . are not sure if you had chickenpox There is no maximum age for getting Shingrix. If you had shingles in the past, you can get Shingrix to help prevent future occurrences of the disease. There is no specific length of time that you need to wait after having shingles before you can receive Shingrix, but generally you should make sure the  shingles rash has gone away before getting vaccinated. You can get Shingrix whether or not you remember having had chickenpox in the past. Studies show that more than 99% of Americans 40 years and older have had chickenpox, even if they don't remember having the disease. Chickenpox and shingles are related because they are caused by the same virus (varicella zoster virus). After a person recovers from chickenpox, the virus stays dormant (inactive) in the body. It can reactivate years later and cause shingles. If you had Zostavax in the recent past, you should wait at least eight weeks before getting Shingrix. Talk to your healthcare provider to determine the best time to get Shingrix. Shingrix is available in [employer] and pharmacies. To find doctor's offices or pharmacies near you that offer the vaccine, visit [employer] Vaccine FinderExternal. If you have questions about Shingrix, talk with your healthcare provider. Vaccine for Those 66 Years and Older  Shingrix reduces the risk of shingles and PHN by more than 90% in people 44 and older. CDC recommends the vaccine for healthy adults 55 and older.  Who Should Not Get Shingrix? You should not get Shingrix if you: . have ever had a severe allergic reaction  to any component of the vaccine or after a dose of Shingrix  . tested negative for immunity to varicella zoster virus. If you test negative, you should get chickenpox vaccine.  . currently have shingles  . currently are pregnant or breastfeeding. Women who are pregnant or breastfeeding should wait to get Shingrix.  Marland Kitchen receive specific antiviral drugs (acyclovir, famciclovir, or valacyclovir) 24 hours before vaccination (avoid use of these antiviral drugs for 14 days after vaccination)- zoster vaccine live only If you have a minor acute (starts suddenly) illness, such as a cold, you may get Shingrix. But if you have a moderate or severe acute illness, you should usually wait until you recover before getting the vaccine. This includes anyone with a temperature of 101.66F or higher. The side effects of the Shingrix are temporary, and usually last 2 to 3 days. While you may experience pain for a few days after getting Shingrix, the pain will be less severe than having shingles and the complications from the disease. How Well Does Shingrix Work? Two doses of Shingrix provides strong protection against shingles and postherpetic neuralgia (PHN), the most common complication of shingles. . In adults 66 to 79 years old who got two doses, Shingrix was 97% effective in preventing shingles; among adults 70 years and older, Shingrix was 91% effective.  . In adults 37 to 78 years old who got two doses, Shingrix was 91% effective in preventing PHN; among adults 70 years and older, Shingrix was 89% effective. Shingrix protection remained high (more than 85%) in people 70 years and older throughout the four years following vaccination. Since your risk of shingles and PHN increases as you get older, it is important to have strong protection against shingles in your older years. Top of Page  What Are the Possible Side Effects of Shingrix? Studies show that Shingrix is safe. The vaccine helps your body create a strong  defense against shingles. As a result, you are likely to have temporary side effects from getting the shots. The side effects may affect your ability to do normal daily activities for 2 to 3 days. Most people got a sore arm with mild or moderate pain after getting Shingrix, and some also had redness and swelling where they got the shot. Some people felt tired, had  muscle pain, a headache, shivering, fever, stomach pain, or nausea. About 1 out of 6 people who got Shingrix experienced side effects that prevented them from doing regular activities. Symptoms went away on their own in about 2 to 3 days. Side effects were more common in younger people. You might have a reaction to the first or second dose of Shingrix, or both doses. If you experience side effects, you may choose to take over-the-counter pain medicine such as ibuprofen or acetaminophen. If you experience side effects from Shingrix, you should report them to the Vaccine Adverse Event Reporting System (VAERS). Your doctor might file this report, or you can do it yourself through the VAERS websiteExternal, or by calling [phone]. If you have any questions about side effects from Shingrix, talk with your doctor. The shingles vaccine does not contain thimerosal (a preservative containing mercury). Top of Page  When Should I See a Doctor Because of the Side Effects I Experience From Shingrix? In clinical trials, Shingrix was not associated with serious adverse events. In fact, serious side effects from vaccines are extremely rare. For example, for every 1 million doses of a vaccine given, only one or two people may have a severe allergic reaction. Signs of an allergic reaction happen within minutes or hours after vaccination and include hives, swelling of the face and throat, difficulty breathing, a fast heartbeat, dizziness, or weakness. If you experience these or any other life-threatening symptoms, see a doctor right away. Shingrix causes a  strong response in your immune system, so it may produce short-term side effects more intense than you are used to from other vaccines. These side effects can be uncomfortable, but they are expected and usually go away on their own in 2 or 3 days. Top of Page  How Can I Pay For Shingrix? There are several ways shingles vaccine may be paid for: Medicare . Medicare Part D plans cover the shingles vaccine, but there may be a cost to you depending on your plan. There may be a copay for the vaccine, or you may need to pay in full then get reimbursed for a certain amount.  . Medicare Part B does not cover the shingles vaccine. Medicaid . Medicaid may or may not cover the vaccine. Contact your insurer to find out. Private health insurance . Many private health insurance plans will cover the vaccine, but there may be a cost to you depending on your plan. Contact your insurer to find out. Vaccine assistance programs . Some pharmaceutical companies provide vaccines to eligible adults who cannot afford them. You may want to check with the vaccine manufacturer, [employer], about Shingrix. If you do not currently have health insurance, learn more about affordable health coverage optionsExternal. To find doctor's offices or pharmacies near you that offer the vaccine, visit HealthMap Vaccine FinderExternal.

## 2019-03-13 DIAGNOSIS — D649 Anemia, unspecified: Secondary | ICD-10-CM | POA: Insufficient documentation

## 2019-03-13 LAB — COMPLETE METABOLIC PANEL WITH GFR
AG Ratio: 1.4 (calc) (ref 1.0–2.5)
ALT: 19 U/L (ref 9–46)
AST: 22 U/L (ref 10–35)
Albumin: 4.4 g/dL (ref 3.6–5.1)
Alkaline phosphatase (APISO): 73 U/L (ref 35–144)
BUN: 10 mg/dL (ref 7–25)
CO2: 30 mmol/L (ref 20–32)
Calcium: 9.8 mg/dL (ref 8.6–10.3)
Chloride: 101 mmol/L (ref 98–110)
Creat: 1.04 mg/dL (ref 0.70–1.18)
GFR, Est African American: 79 mL/min/{1.73_m2} (ref >=60)
GFR, Est Non African American: 68 mL/min/{1.73_m2} (ref >=60)
Globulin: 3.2 g/dL (calc) (ref 1.9–3.7)
Glucose, Bld: 96 mg/dL (ref 65–99)
Potassium: 4.5 mmol/L (ref 3.5–5.3)
Sodium: 137 mmol/L (ref 135–146)
Total Bilirubin: 1 mg/dL (ref 0.2–1.2)
Total Protein: 7.6 g/dL (ref 6.1–8.1)

## 2019-03-13 LAB — CBC WITH DIFFERENTIAL/PLATELET
Absolute Monocytes: 618 cells/uL (ref 200–950)
Basophils Absolute: 50 cells/uL (ref 0–200)
Basophils Relative: 0.7 %
Eosinophils Absolute: 270 cells/uL (ref 15–500)
Eosinophils Relative: 3.8 %
HCT: 33.5 % — ABNORMAL LOW (ref 38.5–50.0)
Hemoglobin: 10.5 g/dL — ABNORMAL LOW (ref 13.2–17.1)
Lymphs Abs: 1853 cells/uL (ref 850–3900)
MCH: 26.6 pg — ABNORMAL LOW (ref 27.0–33.0)
MCHC: 31.3 g/dL — ABNORMAL LOW (ref 32.0–36.0)
MCV: 85 fL (ref 80.0–100.0)
MPV: 11 fL (ref 7.5–12.5)
Monocytes Relative: 8.7 %
Neutro Abs: 4310 cells/uL (ref 1500–7800)
Neutrophils Relative %: 60.7 %
Platelets: 437 10*3/uL — ABNORMAL HIGH (ref 140–400)
RBC: 3.94 10*6/uL — ABNORMAL LOW (ref 4.20–5.80)
RDW: 16.1 % — ABNORMAL HIGH (ref 11.0–15.0)
Total Lymphocyte: 26.1 %
WBC: 7.1 10*3/uL (ref 3.8–10.8)

## 2019-03-13 LAB — HEMOGLOBIN A1C
Hgb A1c MFr Bld: 6.1 % of total Hgb — ABNORMAL HIGH (ref <5.7)
Mean Plasma Glucose: 128 (calc)
eAG (mmol/L): 7.1 (calc)

## 2019-03-13 LAB — TSH: TSH: 4.54 mIU/L — ABNORMAL HIGH (ref 0.40–4.50)

## 2019-03-13 LAB — IRON,TIBC AND FERRITIN PANEL
%SAT: 9 % (calc) — ABNORMAL LOW (ref 20–48)
Ferritin: 11 ng/mL — ABNORMAL LOW (ref 24–380)
Iron: 39 ug/dL — ABNORMAL LOW (ref 50–180)
TIBC: 455 mcg/dL (calc) — ABNORMAL HIGH (ref 250–425)

## 2019-03-13 LAB — TEST AUTHORIZATION

## 2019-03-13 LAB — LIPID PANEL
Cholesterol: 162 mg/dL (ref <200)
HDL: 29 mg/dL — ABNORMAL LOW (ref >=40)
LDL Cholesterol (Calc): 112 mg/dL (calc) — ABNORMAL HIGH
Non-HDL Cholesterol (Calc): 133 mg/dL (calc) — ABNORMAL HIGH (ref <130)
Total CHOL/HDL Ratio: 5.6 (calc) — ABNORMAL HIGH (ref <5.0)
Triglycerides: 99 mg/dL (ref <150)

## 2019-03-13 LAB — MAGNESIUM: Magnesium: 2.2 mg/dL (ref 1.5–2.5)

## 2019-03-13 LAB — VITAMIN D 25 HYDROXY (VIT D DEFICIENCY, FRACTURES): Vit D, 25-Hydroxy: 40 ng/mL (ref 30–100)

## 2019-03-13 NOTE — Addendum Note (Signed)
Addended by: Vicie Mutters R on: 03/13/2019 08:13 AM   Modules accepted: Orders

## 2019-03-19 ENCOUNTER — Ambulatory Visit
Admission: RE | Admit: 2019-03-19 | Discharge: 2019-03-19 | Disposition: A | Discharge status: Home / Self Care | Payer: Medicare Other | Source: Ambulatory Visit | Attending: Physician Assistant | Admitting: Physician Assistant

## 2019-03-19 DIAGNOSIS — R0602 Shortness of breath: Secondary | ICD-10-CM | POA: Diagnosis not present

## 2019-03-19 DIAGNOSIS — J449 Chronic obstructive pulmonary disease, unspecified: Secondary | ICD-10-CM

## 2019-03-20 ENCOUNTER — Other Ambulatory Visit: Payer: Self-pay | Admitting: Physician Assistant

## 2019-03-20 DIAGNOSIS — Z87891 Personal history of nicotine dependence: Secondary | ICD-10-CM

## 2019-03-20 DIAGNOSIS — R0609 Other forms of dyspnea: Secondary | ICD-10-CM

## 2019-03-20 DIAGNOSIS — R9389 Abnormal findings on diagnostic imaging of other specified body structures: Secondary | ICD-10-CM

## 2019-03-20 DIAGNOSIS — J449 Chronic obstructive pulmonary disease, unspecified: Secondary | ICD-10-CM

## 2019-03-26 ENCOUNTER — Telehealth: Payer: Self-pay | Admitting: *Deleted

## 2019-03-26 NOTE — Telephone Encounter (Signed)
Patient called and states he does not want to go for the CT of the chest on 03/28/2019. He will discuss with Vicie Mutters PA, at his 04/17/2019 appointment.

## 2019-03-28 ENCOUNTER — Inpatient Hospital Stay: Admission: RE | Admit: 2019-03-28 | Payer: Medicare Other | Source: Ambulatory Visit

## 2019-04-01 DIAGNOSIS — M546 Pain in thoracic spine: Secondary | ICD-10-CM | POA: Diagnosis not present

## 2019-04-01 DIAGNOSIS — Z79891 Long term (current) use of opiate analgesic: Secondary | ICD-10-CM | POA: Diagnosis not present

## 2019-04-01 DIAGNOSIS — M40204 Unspecified kyphosis, thoracic region: Secondary | ICD-10-CM | POA: Diagnosis not present

## 2019-04-01 DIAGNOSIS — G894 Chronic pain syndrome: Secondary | ICD-10-CM | POA: Diagnosis not present

## 2019-04-07 ENCOUNTER — Other Ambulatory Visit (INDEPENDENT_AMBULATORY_CARE_PROVIDER_SITE_OTHER): Payer: Medicare Other

## 2019-04-07 DIAGNOSIS — Z1211 Encounter for screening for malignant neoplasm of colon: Secondary | ICD-10-CM

## 2019-04-07 LAB — POC HEMOCCULT BLD/STL (HOME/3-CARD/SCREEN)
Card #2 Fecal Occult Blod, POC: NEGATIVE
Card #3 Fecal Occult Blood, POC: NEGATIVE
Fecal Occult Blood, POC: NEGATIVE

## 2019-04-15 DIAGNOSIS — H26492 Other secondary cataract, left eye: Secondary | ICD-10-CM | POA: Diagnosis not present

## 2019-04-15 DIAGNOSIS — H18413 Arcus senilis, bilateral: Secondary | ICD-10-CM | POA: Diagnosis not present

## 2019-04-15 DIAGNOSIS — Z961 Presence of intraocular lens: Secondary | ICD-10-CM | POA: Diagnosis not present

## 2019-04-15 DIAGNOSIS — H35372 Puckering of macula, left eye: Secondary | ICD-10-CM | POA: Diagnosis not present

## 2019-04-16 NOTE — Progress Notes (Signed)
Assessment and Plan:  Anemia, unspecified type Get back on iron supplement -     CBC with Diff -     Iron,Total/Total Iron Binding Cap -     Ferritin  CKD (chronic kidney disease) stage 2, GFR 60-89 ml/min -     COMPLETE METABOLIC PANEL WITH GFR - added lasix, will recheck kidney functino  Vitamin B12 deficiency Get back on B12  Abnormal chest x-ray -     DG Chest 2 View; Future Will repeat CXR since was abnormal, declined CT scan due to cost however if still abnormal will get CT scan  Hypothyroidism, unspecified type -     TSH - get back on supplement  Future Appointments  Date Time Provider [place]  03/11/2020  3:00 PM Vicie Mutters, PA-C [id number] None    HPI 78 y.o.male with history of CKD stage 2, celiac disease presents for anemia, CKD with recent addition of lasix, abnormal thyroid and abnormal chest xray.  He admits that he had been off all of his supplements for 1-2 months  He is on lasix 1/2 pill a day for swelling and BP, doing well.  Lab Results  Component Value Date   GFRNONAA 68 03/12/2019   Last visit he was found to have iron def anemia.  Last colonoscopy: 2013, he is not on metformin, he is on PPI, patient has CKD and celiac diet controlled.  He had a negative hemoccult  Lab Results  Component Value Date   VITAMINB12 957 07/31/2017   Lab Results  Component Value Date   IRON 39 (L) 03/12/2019   TIBC 455 (H) 03/12/2019   FERRITIN 11 (L) 03/12/2019   Lab Results  Component Value Date   WBC 7.1 03/12/2019   HGB 10.5 (L) 03/12/2019   HCT 33.5 (L) 03/12/2019   MCV 85.0 03/12/2019   PLT 437 (H) 03/12/2019   He is on thyroid supplement from over the counter.  Lab Results  Component Value Date   TSH 4.54 (H) 03/12/2019   Chest xray was done due to cough, remote smoking history, showed abnormality that could be rib shadow. Will repeat CXR over next month.  IMPRESSION: 1. No radiographic evidence of acute cardiopulmonary  disease. 2. New nodular density projecting over the lower lateral right hemithorax seen only on a single view. This may be a confluence of rib shadows, however, follow-up standing PA and lateral chest radiograph is recommended in 1-2 months to ensure the stability or resolution of this finding.  Patient Active Problem List   Diagnosis Date Noted  . Anemia 03/13/2019  . Chronic venous insufficiency 03/12/2019  . CKD (chronic kidney disease) stage 2, 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/2015  . COPD (chronic obstructive pulmonary disease) ([place]) 12/08/2015  . Chronic pain syndrome 05/26/2015  . DDD (degenerative disc disease), lumbar 05/26/2015  . Medication management 05/26/2015  . 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 Lumbago with sciatica 08/06/2014  . Hyperlipidemia 08/20/2013  . Hypertension   . Abnormal glucose   . Vitamin D deficiency   . Arthritis   . Vitamin B12 deficiency   . Celiac disease 06/19/2012  . GERD (gastroesophageal reflux disease) 04/30/2012  . 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 Hypothyroidism 04/30/2012  . 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  furosemide (LASIX) 40 MG tablet, Take 1  tablet (40 mg total) by mouth daily. .  rosuvastatin (CRESTOR) 40 MG tablet, Take 1/2 to 1 tablet daily or as directed for Cholesterol   Current Outpatient Medications (Analgesics):  .  oxyCODONE (OXY IR/ROXICODONE) 5 MG immediate release tablet, TAKE 1 TABLET EVERY 4 HOURS AS NEEDED  Current Outpatient Medications (Hematological):  Marland Kitchen  Cyanocobalamin (VITAMIN B 12 PO), Take 2,500 mcg by mouth 2 (two) times daily.  Current Outpatient Medications (Other):  Marland Kitchen  Calcium Carbonate-Vit D-Min (CALCIUM 1200 PO), Take by mouth daily. .  Cholecalciferol (VITAMIN D PO), Take  5,000 Int'l Units by mouth daily. .  clotrimazole-betamethasone (LOTRISONE) cream, Apply 1 application topically 2 (two) times daily. Marland Kitchen  MAGNESIUM PO, Take 500 mg by mouth 2 (two) times daily.  Marland Kitchen  MILK THISTLE PO, Take 240 mg by mouth daily. .  Omega-3 Fatty Acids (OMEGA-3 FISH OIL PO), Take by mouth daily. Marland Kitchen  omeprazole (PRILOSEC) 20 MG capsule, Take 1 capsule (20 mg total) by mouth daily. Marland Kitchen  OVER THE COUNTER MEDICATION, Thyroid complex 2 tabs daily. Marland Kitchen  OVER THE COUNTER MEDICATION, Kelp with Iodine 1 daily .  OVER THE COUNTER MEDICATION, Tumeric Curcumin 500 mg 2 times daily .  OVER THE COUNTER MEDICATION, Chlor oxygen red blood builder 500 mg 2 capsules daily. .  Red Yeast Rice 600 MG CAPS, Take 600 mg by mouth 3 (three) times daily. .  Sennosides (SENOKOT PO), Take by mouth as needed. .  terbinafine (LAMISIL) 1 % cream, Apply 1 application topically daily. For 4 weeks  Allergies  Allergen Reactions  . Feraheme [Ferumoxytol] Other (See Comments)    Swelling of lips and tongue, could not talk 30 minutes after the infusion.  . Naprosyn [Naproxen] Swelling    Unsure if naprosyn caused this reaction  . Latex   . Levothyroxine     Lip swelling    ROS: all negative except above.   Physical Exam: Filed Weights   04/17/19 1535  Weight: 188 lb (85.3 kg)   BP 138/72   Pulse 88   Temp 98.7 F (37.1 C)   Wt 188 lb (85.3 kg)   SpO2 97%   BMI 30.34 kg/m  General Appearance: Well nourished, in no apparent distress. Eyes: PERRLA, EOMs, conjunctiva no swelling or erythema Sinuses: No Frontal/maxillary tenderness ENT/Mouth: Ext aud canals clear, TMs without erythema, bulging. No erythema, swelling, or exudate on post pharynx.  Tonsils not swollen or erythematous. Hearing normal.  Neck: Supple, thyroid normal.  Respiratory: Respiratory effort normal, BS equal bilaterally without rales, rhonchi, wheezing or stridor.  Cardio: RRR with no MRGs. Brisk peripheral pulses without edema.   Abdomen: Soft, + BS.  Non tender, no guarding, rebound, hernias, masses. Lymphatics: Non tender without lymphadenopathy.  Musculoskeletal: Full ROM, 5/5 strength, normal gait.  Skin: Warm, dry without rashes, lesions, ecchymosis.  Neuro: Cranial nerves intact. Normal muscle tone, no cerebellar symptoms. Sensation intact.  Psych: Awake and oriented X 3, normal affect, Insight and Judgment appropriate.     Vicie Mutters, PA-C 3:50 PM [facility]

## 2019-04-17 ENCOUNTER — Encounter: Payer: Self-pay | Admitting: Physician Assistant

## 2019-04-17 ENCOUNTER — Ambulatory Visit (INDEPENDENT_AMBULATORY_CARE_PROVIDER_SITE_OTHER): Payer: Medicare Other | Admitting: Physician Assistant

## 2019-04-17 ENCOUNTER — Other Ambulatory Visit: Payer: Self-pay

## 2019-04-17 VITALS — BP 138/72 | HR 88 | Temp 98.7°F | Wt 188.0 lb

## 2019-04-17 DIAGNOSIS — R9389 Abnormal findings on diagnostic imaging of other specified body structures: Secondary | ICD-10-CM

## 2019-04-17 DIAGNOSIS — E039 Hypothyroidism, unspecified: Secondary | ICD-10-CM

## 2019-04-17 DIAGNOSIS — N182 Chronic kidney disease, stage 2 (mild): Secondary | ICD-10-CM | POA: Diagnosis not present

## 2019-04-17 DIAGNOSIS — E538 Deficiency of other specified B group vitamins: Secondary | ICD-10-CM

## 2019-04-17 DIAGNOSIS — D649 Anemia, unspecified: Secondary | ICD-10-CM | POA: Diagnosis not present

## 2019-04-17 NOTE — Patient Instructions (Addendum)
INFORMATION ABOUT YOUR XRAY  Please repeat the chest in next month.  Can walk into [address] for an Insurance account manager. They will have the order and take you back. You do not any paper work, I should get the result back today or tomorrow. This order is good for a year.  Can call [phone] to schedule an appointment if you wish.   Please get back on the blood builder/iron supplement   I'm going to give you Lasix This is a fluid pill that makes you pee more often, take it in the morning  If can make you pee more for about 6 hours If can deplete your magnesium and potassium so if you get leg cramps let me know and we may need to replace this Monitor your weight and blood pressure while on it If you get dizzy while on it stop the medication and call me Any questions or concerns stop the medication and call the office.    Iron Deficiency Anemia, Adult Iron deficiency anemia is a condition in which the concentration of red blood cells or hemoglobin in the blood is below normal because of too little iron. Hemoglobin is a substance in red blood cells that carries oxygen to the body's tissues. When the concentration of red blood cells or hemoglobin is too low, not enough oxygen reaches these tissues. Iron deficiency anemia is usually long-lasting (chronic) and it develops over time. It may or may not cause symptoms. It is a common type of anemia. What are the causes? This condition may be caused by:  Not enough iron in the diet.  Blood loss caused by bleeding in the intestine.  Blood loss from a gastrointestinal condition like Crohn disease.  Frequent blood draws, such as from blood donation.  Abnormal absorption in the gut.  Heavy menstrual periods in women.  Cancers of the gastrointestinal system, such as colon cancer. What are the signs or symptoms? Symptoms of this condition may include:  Fatigue.  Headache.  Pale skin, lips, and nail beds.  Poor  appetite.  Weakness.  Shortness of breath.  Dizziness.  Cold hands and feet.  Fast or irregular heartbeat.  Irritability. This is more common in severe anemia.  Rapid breathing. This is more common in severe anemia. Mild anemia may not cause any symptoms. How is this diagnosed? This condition is diagnosed based on:  Your medical history.  A physical exam.  Blood tests. You may have additional tests to find the underlying cause of your anemia, such as:  Testing for blood in the stool (fecal occult blood test).  A procedure to see inside your colon and rectum (colonoscopy).  A procedure to see inside your esophagus and stomach (endoscopy).  A test in which cells are removed from bone marrow (bone marrow aspiration) or fluid is removed from the bone marrow to be examined (biopsy). This is rarely needed. How is this treated? This condition is treated by correcting the cause of your iron deficiency. Treatment may involve:  Adding iron-rich foods to your diet.  Taking iron supplements. If you are pregnant or breastfeeding, you may need to take extra iron because your normal diet usually does not provide the amount of iron that you need.  Increasing vitamin C intake. Vitamin C helps your body absorb iron. Your health care provider may recommend that you take iron supplements along with a glass of orange juice or a vitamin C supplement.  Medicines to make heavy menstrual flow lighter.  Surgery. You  may need repeat blood tests to determine whether treatment is working. Depending on the underlying cause, the anemia should be corrected within 2 months of starting treatment. If the treatment does not seem to be working, you may need more testing. Follow these instructions at home: Medicines  Take over-the-counter and prescription medicines only as told by your health care provider. This includes iron supplements and vitamins.  If you cannot tolerate taking iron supplements by  mouth, talk with your health care provider about taking them through a vein (intravenously) or an injection into a muscle.  For the best iron absorption, you should take iron supplements when your stomach is empty. If you cannot tolerate them on an empty stomach, you may need to take them with food.  Do not drink milk or take antacids at the same time as your iron supplements. Milk and antacids may interfere with iron absorption.  Iron supplements can cause constipation. To prevent constipation, include fiber in your diet as told by your health care provider. A stool softener may also be recommended. Eating and drinking   Talk with your health care provider before changing your diet. He or she may recommend that you eat foods that contain a lot of iron, such as: ? Liver. ? Low-fat (lean) beef. ? Breads and cereals that have iron added to them (are fortified). ? Eggs. ? Dried fruit. ? Dark green, leafy vegetables.  To help your body use the iron from iron-rich foods, eat those foods at the same time as fresh fruits and vegetables that are high in vitamin C. Foods that are high in vitamin C include: ? Oranges. ? Peppers. ? Tomatoes. ? Mangoes.  Drinkenoughfluid to keep your urine clear or pale yellow. General instructions  Return to your normal activities as told by your health care provider. Ask your health care provider what activities are safe for you.  Practice good hygiene. Anemia can make you more prone to illness and infection.  Keep all follow-up visits as told by your health care provider. This is important. Contact a health care provider if:  You feel nauseous or you vomit.  You feel weak.  You have unexplained sweating.  You develop symptoms of constipation, such as: ? Having fewer than three bowel movements a week. ? Straining to have a bowel movement. ? Having stools that are hard, dry, or larger than normal. ? Feeling full or bloated. ? Pain in the lower  abdomen. ? Not feeling relief after having a bowel movement. Get help right away if:  You faint. If this happens, do not drive yourself to the hospital. Call your local emergency services (911 in the [place]  You have chest pain.  You have shortness of breath that: ? Is severe. ? Gets worse with physical activity.  You have a rapid heartbeat.  You become light-headed when getting up from a sitting or lying down position. This information is not intended to replace advice given to you by your health care provider. Make sure you discuss any questions you have with your health care provider. Document Released: 05/26/2000 Document Revised: 05/11/2017 Document Reviewed: 02/16/2016 Elsevier Patient Education  2020 [employer].

## 2019-04-18 LAB — COMPLETE METABOLIC PANEL WITH GFR
AG Ratio: 1.4 (calc) (ref 1.0–2.5)
ALT: 20 U/L (ref 9–46)
AST: 23 U/L (ref 10–35)
Albumin: 4.1 g/dL (ref 3.6–5.1)
Alkaline phosphatase (APISO): 72 U/L (ref 35–144)
BUN: 14 mg/dL (ref 7–25)
CO2: 28 mmol/L (ref 20–32)
Calcium: 9.5 mg/dL (ref 8.6–10.3)
Chloride: 105 mmol/L (ref 98–110)
Creat: 1.07 mg/dL (ref 0.70–1.18)
GFR, Est African American: 77 mL/min/{1.73_m2} (ref >=60)
GFR, Est Non African American: 66 mL/min/{1.73_m2} (ref >=60)
Globulin: 3 g/dL (calc) (ref 1.9–3.7)
Glucose, Bld: 58 mg/dL — ABNORMAL LOW (ref 65–99)
Potassium: 4.8 mmol/L (ref 3.5–5.3)
Sodium: 140 mmol/L (ref 135–146)
Total Bilirubin: 0.8 mg/dL (ref 0.2–1.2)
Total Protein: 7.1 g/dL (ref 6.1–8.1)

## 2019-04-18 LAB — CBC WITH DIFFERENTIAL/PLATELET
Absolute Monocytes: 582 cells/uL (ref 200–950)
Basophils Absolute: 71 cells/uL (ref 0–200)
Basophils Relative: 1 %
Eosinophils Absolute: 327 cells/uL (ref 15–500)
Eosinophils Relative: 4.6 %
HCT: 33.6 % — ABNORMAL LOW (ref 38.5–50.0)
Hemoglobin: 10.6 g/dL — ABNORMAL LOW (ref 13.2–17.1)
Lymphs Abs: 2087 cells/uL (ref 850–3900)
MCH: 26.7 pg — ABNORMAL LOW (ref 27.0–33.0)
MCHC: 31.5 g/dL — ABNORMAL LOW (ref 32.0–36.0)
MCV: 84.6 fL (ref 80.0–100.0)
MPV: 10.6 fL (ref 7.5–12.5)
Monocytes Relative: 8.2 %
Neutro Abs: 4033 cells/uL (ref 1500–7800)
Neutrophils Relative %: 56.8 %
Platelets: 429 10*3/uL — ABNORMAL HIGH (ref 140–400)
RBC: 3.97 10*6/uL — ABNORMAL LOW (ref 4.20–5.80)
RDW: 16.4 % — ABNORMAL HIGH (ref 11.0–15.0)
Total Lymphocyte: 29.4 %
WBC: 7.1 10*3/uL (ref 3.8–10.8)

## 2019-04-18 LAB — IRON, TOTAL/TOTAL IRON BINDING CAP
%SAT: 7 % (calc) — ABNORMAL LOW (ref 20–48)
Iron: 28 ug/dL — ABNORMAL LOW (ref 50–180)
TIBC: 414 mcg/dL (calc) (ref 250–425)

## 2019-04-18 LAB — TSH: TSH: 4.82 mIU/L — ABNORMAL HIGH (ref 0.40–4.50)

## 2019-04-18 LAB — FERRITIN: Ferritin: 9 ng/mL — ABNORMAL LOW (ref 24–380)

## 2019-04-29 ENCOUNTER — Ambulatory Visit
Admission: RE | Admit: 2019-04-29 | Discharge: 2019-04-29 | Disposition: A | Discharge status: Home / Self Care | Payer: Medicare Other | Source: Ambulatory Visit | Attending: Physician Assistant | Admitting: Physician Assistant

## 2019-04-29 DIAGNOSIS — R05 Cough: Secondary | ICD-10-CM | POA: Diagnosis not present

## 2019-04-29 DIAGNOSIS — R9389 Abnormal findings on diagnostic imaging of other specified body structures: Secondary | ICD-10-CM

## 2019-04-30 ENCOUNTER — Other Ambulatory Visit: Payer: Self-pay | Admitting: Physician Assistant

## 2019-04-30 DIAGNOSIS — R0609 Other forms of dyspnea: Secondary | ICD-10-CM

## 2019-04-30 DIAGNOSIS — Z87891 Personal history of nicotine dependence: Secondary | ICD-10-CM

## 2019-04-30 DIAGNOSIS — R9389 Abnormal findings on diagnostic imaging of other specified body structures: Secondary | ICD-10-CM

## 2019-04-30 DIAGNOSIS — J449 Chronic obstructive pulmonary disease, unspecified: Secondary | ICD-10-CM

## 2019-05-15 ENCOUNTER — Ambulatory Visit (INDEPENDENT_AMBULATORY_CARE_PROVIDER_SITE_OTHER): Payer: Medicare Other | Admitting: Physician Assistant

## 2019-05-15 ENCOUNTER — Other Ambulatory Visit: Payer: Self-pay

## 2019-05-15 VITALS — BP 140/80 | HR 82 | Temp 97.7°F | Resp 14 | Wt 191.0 lb

## 2019-05-15 DIAGNOSIS — E039 Hypothyroidism, unspecified: Secondary | ICD-10-CM | POA: Diagnosis not present

## 2019-05-15 DIAGNOSIS — L309 Dermatitis, unspecified: Secondary | ICD-10-CM

## 2019-05-15 DIAGNOSIS — I83023 Varicose veins of left lower extremity with ulcer of ankle: Secondary | ICD-10-CM | POA: Diagnosis not present

## 2019-05-15 DIAGNOSIS — I872 Venous insufficiency (chronic) (peripheral): Secondary | ICD-10-CM | POA: Diagnosis not present

## 2019-05-15 DIAGNOSIS — D649 Anemia, unspecified: Secondary | ICD-10-CM | POA: Diagnosis not present

## 2019-05-15 DIAGNOSIS — L97329 Non-pressure chronic ulcer of left ankle with unspecified severity: Secondary | ICD-10-CM | POA: Diagnosis not present

## 2019-05-15 MED ORDER — HYDROXYZINE HCL 25 MG PO TABS: 25.0000 mg | ORAL_TABLET | Freq: Three times a day (TID) | ORAL | 0 refills | Status: DC | PRN

## 2019-05-15 MED ORDER — TRIAMTERENE-HCTZ 37.5-25 MG PO TABS: 1.0000 | ORAL_TABLET | Freq: Every day | ORAL | 11 refills | Status: DC

## 2019-05-15 MED ORDER — DEXAMETHASONE SODIUM PHOSPHATE 100 MG/10ML IJ SOLN: 10.0000 mg | Freq: Once | INTRAMUSCULAR | Status: DC

## 2019-05-15 MED ORDER — DOXYCYCLINE HYCLATE 100 MG PO CAPS: ORAL_CAPSULE | ORAL | 0 refills | Status: DC

## 2019-05-15 NOTE — Patient Instructions (Addendum)
Make sure iron builder has iron in it if not get on  Iron 65-325  with vitamin C 540m and probiotic  Elevate that leg  Will get ultrasound and please follow up with wound center about your leg  Can take atarax 1/2-1 up to 3 x a day for itching  Can get doxy for that ankle to cover antibiotics  Stop the lasix, can start on the new fluid pill once a day in the morning  The patient was advised to call immediately if he has any concerning symptoms in the interval. The patient voices understanding of current treatment options and is in agreement with the current care plan.The patient knows to call the clinic with any problems, questions or concerns or go to the ER if any further progression of symptoms.    Wound care to do every day . Take off old bandage . Rinse off leg thoroughly in shower, wipe off any excess thick yellow discharge until you see a clean wound with some gauze or soft washcloth . Mix up betadine slurry: Combine sugar and betadine in a container that you can seal and keep; mix ingredients to create a paste the consistency of peanut butter . Apply betadine and sugar mixture to wound to cover all edges, apply non-adherent dressing pads, then an absorbent dressing (gauze or ABD) to catch any excess leaking, then wrap with ace, gauze wrapping and or tape to hold dressing in place.  . Remember betadine will stain clothing and sheets - make sure to cover/protect any furniture or linens - you can put down absorbent chucks pad or trash bags  Venous Ulcer A venous ulcer is a shallow sore on your lower leg that is caused by poor circulation in your veins. This condition used to be called stasis ulcer. Venous ulcer is the most common type of lower leg ulcer. You may have venous ulcers on one leg or on both legs. The area where this condition most commonly develops is around the ankles. A venous ulcer may last for a long time (chronic ulcer) or it may return repeatedly (recurrent ulcer).  What are the causes? A venous ulcer may be caused by any condition that causes poor blood flow in your legs. Veins have valves that help return blood to the heart. If these valves do not work properly:  Blood can flow backward and pool in the lower legs.  Blood can then leak out of your veins, which can irritate your skin.  Irritation can cause a break in the skin, which becomes a venous ulcer. What increases the risk? You are more likely to develop this condition if you:  Are 652years of age or older.  Are male.  Are overweight.  Are not active.  Have had a leg ulcer in the past.  Have varicose veins.  Have clots in your lower leg veins (deep vein thrombosis).  Have inflammation of your leg veins (phlebitis).  Have recently had a pregnancy.  Use products that contain nicotine or tobacco. What are the signs or symptoms? The main symptom of this condition is an open sore near your ankle. Other symptoms may include:  Swelling.  Thickening of the skin.  Fluid leaking from the ulcer.  Bleeding.  Itching.  Pain and swelling that gets worse when you stand up and feels better when you raise your leg.  Blotchy skin.  Darkening of the skin. How is this diagnosed? Your health care provider may suspect a venous ulcer based on your  medical history and your risk factors. He or she may:  Do a physical exam.  Do other tests, such as: ? Measuring blood pressure in your arms and legs. ? Using sound waves (ultrasound) to measure blood flow in your leg veins. How is this treated? This condition may be treated by:  Keeping your leg raised (elevated).  Wearing a type of bandage or stocking to compress the veins of your leg (compression therapy).  Taking medicines to improve blood flow.  Taking antibiotic medicines to treat infection.  Cleaning your ulcer and removing any dead tissue from the wound (debridement).  Placing various types of medicated bandages  (dressings) or medicated wraps on your ulcer.  Surgery to close the wound using a piece of skin taken from another area of your body (graft). This is only done for wounds that are deep or hard to heal. You may need to try several different types of treatment to get your venous ulcer to heal. Healing may take a long time. Follow these instructions at home: Medicines  Take or apply over-the-counter and prescription medicines only as told by your health care provider.  If you were prescribed an antibiotic medicine, take it as told by your health care provider. Do not stop using the antibiotic even if you start to feel better.  Ask your health care provider if you should take aspirin before long trips. Wound care  Follow instructions from your health care provider about how to take care of your wound. Make sure you: ? Wash your hands with soap and water before and after you change your bandage (dressing). If soap and water are not available, use hand sanitizer. ? Change your dressing as told by your health care provider. ? If you had a skin graft, leave stitches (sutures) in place. These may need to stay in place for 2 weeks or longer. ? Ask when you should remove your dressing. If your dressing is dry and sticks to your leg when you try to remove it, moisten or wet the dressing with saline solution or water so that the dressing can be removed without harming your skin or wound tissue.  When you are able to remove your dressing, check your wound every day for signs of infection. Have a caregiver do this for you if you are not able to do it yourself. Check for: ? More redness, swelling, or pain. ? More fluid or blood. ? Warmth. ? Pus or a bad smell. Activity  Avoid sitting for a long time without moving. Get up to take short walks every 1-2 hours. This is important to improve blood flow in your legs. Ask for help if you feel weak or unsteady.  Ask your health care provider what level of  activity is safe for you.  Rest with your legs raised (elevated) during the day. If possible, elevate your legs above the level of your heart for 30 minutes, 3-4 times a day, or as told by your health care provider.  Do not sit with your legs crossed. General instructions   Wear elastic stockings, compression stockings, or support hose as told by your health care provider.  Raise the foot of your bed as told by your health care provider.  Do not use any products that contain nicotine or tobacco, such as cigarettes, e-cigarettes, and chewing tobacco. If you need help quitting, ask your health care provider.  Keep all follow-up visits as told by your health care provider. This is important. Contact a health  care provider if:  Your ulcer is getting larger or is not healing.  Your pain gets worse. Get help right away if you have:  More redness, swelling, or pain around your ulcer.  More fluid or blood coming from your ulcer.  Warmth in the area around your ulcer.  Pus or a bad smell coming from your ulcer.  A fever. Summary  A venous ulcer is a shallow sore on your lower leg that is caused by poor circulation in your veins.  Follow instructions from your health care provider about how to take care of your wound.  Check your wound every day for signs of infection.  Take over-the-counter and prescription medicines only as told by your health care provider.  Keep all follow-up visits as told by your health care provider. This is important. This information is not intended to replace advice given to you by your health care provider. Make sure you discuss any questions you have with your health care provider. Document Released: 02/21/2001 Document Revised: 01/24/2018 Document Reviewed: 01/24/2018 Elsevier Patient Education  [place].   Rash, Adult A rash is a change in the color of your skin. A rash can also change the way your skin feels. There are many different  conditions and factors that can cause a rash. Some rashes may disappear after a few days, but some may last for a few weeks. Common causes of rashes include:  Viral infections, such as: ? Colds. ? Measles. ? Hand, foot, and mouth disease.  Bacterial infections, such as: ? Scarlet fever. ? Impetigo.  Fungal infections, such as Candida.  Allergic reactions to food, medicines, or skin care products. Follow these instructions at home: The goal of treatment is to stop the itching and keep the rash from spreading. Pay attention to any changes in your symptoms. Follow these instructions to help with your condition: Medicine Take or apply over-the-counter and prescription medicines only as told by your health care provider. These may include:  Corticosteroid creams to treat red or swollen skin.  Anti-itch lotions.  Oral allergy medicines (antihistamines).  Oral corticosteroids for severe symptoms.  Skin care  Apply cool compresses to the affected areas.  Do not scratch or rub your skin.  Avoid covering the rash. Make sure the rash is exposed to air as much as possible. Managing itching and discomfort  Avoid hot showers or baths, which can make itching worse. A cold shower may help.  Try taking a bath with: ? Epsom salts. Follow manufacturer instructions on the packaging. You can get these at your local pharmacy or grocery store. ? Baking soda. Pour a small amount into the bath as told by your health care provider. ? Colloidal oatmeal. Follow manufacturer instructions on the packaging. You can get this at your local pharmacy or grocery store.  Try applying baking soda paste to your skin. Stir water into baking soda until it reaches a paste-like consistency.  Try applying calamine lotion. This is an over-the-counter lotion that helps to relieve itchiness.  Keep cool and out of the sun. Sweating and being hot can make itching worse. General instructions   Rest as needed.   Drink enough fluid to keep your urine pale yellow.  Wear loose-fitting clothing.  Avoid scented soaps, detergents, and perfumes. Use gentle soaps, detergents, perfumes, and other cosmetic products.  Avoid any substance that causes your rash. Keep a journal to help track what causes your rash. Write down: ? What you eat. ? What cosmetic products  you use. ? What you drink. ? What you wear. This includes jewelry.  Keep all follow-up visits as told by your health care provider. This is important. Contact a health care provider if:  You sweat at night.  You lose weight.  You urinate more than normal.  You urinate less than normal, or you notice that your urine is a darker color than usual.  You feel weak.  You vomit.  Your skin or the whites of your eyes look yellow (jaundice).  Your skin: ? Tingles. ? Is numb.  Your rash: ? Does not go away after several days. ? Gets worse.  You are: ? Unusually thirsty. ? More tired than normal.  You have: ? New symptoms. ? Pain in your abdomen. ? A fever. ? Diarrhea. Get help right away if you:  Have a fever and your symptoms suddenly get worse.  Develop confusion.  Have a severe headache or a stiff neck.  Have severe joint pains or stiffness.  Have a seizure.  Develop a rash that covers all or most of your body. The rash may or may not be painful.  Develop blisters that: ? Are on top of the rash. ? Grow larger or grow together. ? Are painful. ? Are inside your nose or mouth.  Develop a rash that: ? Looks like purple pinprick-sized spots all over your body. ? Has a "bull's eye" or looks like a target. ? Is not related to sun exposure, is red and painful, and causes your skin to peel. Summary  A rash is a change in the color of your skin. Some rashes disappear after a few days, but some may last for a few weeks.  The goal of treatment is to stop the itching and keep the rash from spreading.  Take or apply  over-the-counter and prescription medicines only as told by your health care provider.  Contact a health care provider if you have new or worsening symptoms.  Keep all follow-up visits as told by your health care provider. This is important. This information is not intended to replace advice given to you by your health care provider. Make sure you discuss any questions you have with your health care provider. Document Released: 05/19/2002 Document Revised: 09/20/2018 Document Reviewed: 12/31/2017 Elsevier Patient Education  2020 [employer].

## 2019-05-15 NOTE — Progress Notes (Signed)
Subjective:    Patient ID: [name] Fernandez, male    DOB: 05-14-1941, 78 y.o.   MRN: [id number]  HPI 78 y.o. WM presents with rash.   He has had a rash all over his body x 2 weeks, took amoxicillin that he had for dental procedure x 10 days that did not help. States it is very puritic, can not sleep at night due to itching.   Denies specific  food, skin care product, detergent, soap, or other environmental triggers have been identified. DID ADD LASIX AND IRON PILLS LAST VISIT.  She did not experience concomitant cardiopulmonary or GI symptoms.  She has no specific nasal symptom complaints today.  States his ulcer on this left leg that he was following wound care for has gotten worse, has not seen them for a year.   His BP is slightly up today but he has a history of white coat syndrome, BP at home is checked at least once a week and BP is 130/70  Blood pressure 140/80, pulse 82, temperature 97.7 F (36.5 C), resp. rate 14, weight 191 lb (86.6 kg).  Medications  Current Outpatient Medications (Endocrine & Metabolic):  Marland Kitchen  Testosterone 10 MG/ACT (2%) GEL, Place 2 ML/mg topically each day  Current Facility-Administered Medications (Endocrine & Metabolic):  .  dexamethasone (DECADRON) injection 10 mg  Current Outpatient Medications (Cardiovascular):  .  rosuvastatin (CRESTOR) 40 MG tablet, Take 1/2 to 1 tablet daily or as directed for Cholesterol .  triamterene-hydrochlorothiazide (MAXZIDE-25) 37.5-25 MG tablet, Take 1 tablet by mouth daily.     Current Outpatient Medications (Analgesics):  .  oxyCODONE (OXY IR/ROXICODONE) 5 MG immediate release tablet, TAKE 1 TABLET EVERY 4 HOURS AS NEEDED   Current Outpatient Medications (Hematological):  Marland Kitchen  Cyanocobalamin (VITAMIN B 12 PO), Take 2,500 mcg by mouth 2 (two) times daily.   Current Outpatient Medications (Other):  Marland Kitchen  Calcium Carbonate-Vit D-Min (CALCIUM 1200 PO), Take by mouth daily. .  Cholecalciferol (VITAMIN D PO), Take  5,000 Int'l Units by mouth daily. .  clotrimazole-betamethasone (LOTRISONE) cream, Apply 1 application topically 2 (two) times daily. Marland Kitchen  doxycycline (VIBRAMYCIN) 100 MG capsule, Take 1 capsule twice daily with food .  hydrOXYzine (ATARAX/VISTARIL) 25 MG tablet, Take 1 tablet (25 mg total) by mouth 3 (three) times daily as needed for itching. Marland Kitchen  MAGNESIUM PO, Take 500 mg by mouth 2 (two) times daily.  Marland Kitchen  MILK THISTLE PO, Take 240 mg by mouth daily. .  Omega-3 Fatty Acids (OMEGA-3 FISH OIL PO), Take by mouth daily. Marland Kitchen  omeprazole (PRILOSEC) 20 MG capsule, Take 1 capsule (20 mg total) by mouth daily. Marland Kitchen  OVER THE COUNTER MEDICATION, Thyroid complex 2 tabs daily. Marland Kitchen  OVER THE COUNTER MEDICATION, Kelp with Iodine 1 daily .  OVER THE COUNTER MEDICATION, Tumeric Curcumin 500 mg 2 times daily .  OVER THE COUNTER MEDICATION, Chlor oxygen red blood builder 500 mg 2 capsules daily. .  Red Yeast Rice 600 MG CAPS, Take 600 mg by mouth 3 (three) times daily. .  Sennosides (SENOKOT PO), Take by mouth as needed. .  terbinafine (LAMISIL) 1 % cream, Apply 1 application topically daily. For 4 weeks   Problem list He has S/P right TK revision; GERD (gastroesophageal reflux disease); 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 tube) procedure; Hypothyroidism; Celiac disease; Hypertension; Abnormal glucose; Vitamin D deficiency; Arthritis; Vitamin B12 deficiency; Hyperlipidemia; Lumbago with sciatica; Chronic pain syndrome; DDD (degenerative disc disease), lumbar; Medication management; Testosterone deficiency; CKD (chronic kidney  disease) stage 2, GFR 60-89 ml/min; COPD (chronic obstructive pulmonary disease) ([place]); Chronic venous insufficiency; and Anemia on their problem list.  Review of Systems     Objective:   Physical Exam  Macular papular sparse rash along chest, back, arms, and legs, that ulcerates with scratching, no warmth, no discharge. Sparing hands and feet.  Left medial ankle with 1 ulcer,  2 cm  ulcer at [facility] with thin yellow exudate surrounded by 10cmx5 cm warm, erythematous area. 2-3+ edema, blanchable toes with good cap refill.             Assessment & Plan:  Diagnoses and all orders for this visit:  Anemia, unspecified type -     CBC with Diff -     COMPLETE METABOLIC PANEL WITH GFR Make sure the "blood builder" has iron in it  Chronic venous insufficiency -     triamterene-hydrochlorothiazide (MAXZIDE-25) 37.5-25 MG tablet; Take 1 tablet by mouth daily. -     LE VENOUS; Future Elevate leg, will stop lasix and start maxzide-? dermatitis is from lasix- it is a new medication  Venous ulcer of ankle, left (HCC) -     LE VENOUS; Future -     doxycycline (VIBRAMYCIN) 100 MG capsule; Take 1 capsule twice daily with food Repeat US, elevate leg, may need unna boot, follow up with wound care ASAP  Dermatitis -     CBC with Diff -     COMPLETE METABOLIC PANEL WITH GFR -     triamterene-hydrochlorothiazide (MAXZIDE-25) 37.5-25 MG tablet; Take 1 tablet by mouth daily. -     dexamethasone (DECADRON) injection 10 mg -     hydrOXYzine (ATARAX/VISTARIL) 25 MG tablet; Take 1 tablet (25 mg total) by mouth 3 (three) times daily as needed for itching. -? From lasix/medication, check CBC, kidney, liver - if not better will refer to Derm  Hypothyroidism, unspecified type -     TSH   The patient was advised to call immediately if he has any concerning symptoms in the interval. The patient voices understanding of current treatment options and is in agreement with the current care plan.The patient knows to call the clinic with any problems, questions or concerns or go to the ER if any further progression of symptoms.

## 2019-05-16 ENCOUNTER — Ambulatory Visit (HOSPITAL_COMMUNITY)
Admission: RE | Admit: 2019-05-16 | Discharge: 2019-05-16 | Disposition: A | Discharge status: Home / Self Care | Payer: Medicare Other | Source: Ambulatory Visit | Attending: Physician Assistant | Admitting: Physician Assistant

## 2019-05-16 ENCOUNTER — Telehealth (HOSPITAL_COMMUNITY): Payer: Self-pay | Admitting: *Deleted

## 2019-05-16 DIAGNOSIS — I83023 Varicose veins of left lower extremity with ulcer of ankle: Secondary | ICD-10-CM | POA: Insufficient documentation

## 2019-05-16 DIAGNOSIS — I872 Venous insufficiency (chronic) (peripheral): Secondary | ICD-10-CM | POA: Insufficient documentation

## 2019-05-16 DIAGNOSIS — L97329 Non-pressure chronic ulcer of left ankle with unspecified severity: Secondary | ICD-10-CM | POA: Insufficient documentation

## 2019-05-16 LAB — COMPLETE METABOLIC PANEL WITH GFR
AG Ratio: 1.4 (calc) (ref 1.0–2.5)
ALT: 15 U/L (ref 9–46)
AST: 20 U/L (ref 10–35)
Albumin: 4.1 g/dL (ref 3.6–5.1)
Alkaline phosphatase (APISO): 77 U/L (ref 35–144)
BUN: 14 mg/dL (ref 7–25)
CO2: 26 mmol/L (ref 20–32)
Calcium: 9.1 mg/dL (ref 8.6–10.3)
Chloride: 105 mmol/L (ref 98–110)
Creat: 1.02 mg/dL (ref 0.70–1.18)
GFR, Est African American: 81 mL/min/{1.73_m2} (ref >=60)
GFR, Est Non African American: 70 mL/min/{1.73_m2} (ref >=60)
Globulin: 2.9 g/dL (calc) (ref 1.9–3.7)
Glucose, Bld: 90 mg/dL (ref 65–99)
Potassium: 4.5 mmol/L (ref 3.5–5.3)
Sodium: 140 mmol/L (ref 135–146)
Total Bilirubin: 0.9 mg/dL (ref 0.2–1.2)
Total Protein: 7 g/dL (ref 6.1–8.1)

## 2019-05-16 LAB — CBC WITH DIFFERENTIAL/PLATELET
Absolute Monocytes: 720 cells/uL (ref 200–950)
Basophils Absolute: 67 cells/uL (ref 0–200)
Basophils Relative: 0.7 %
Eosinophils Absolute: 480 cells/uL (ref 15–500)
Eosinophils Relative: 5 %
HCT: 31.6 % — ABNORMAL LOW (ref 38.5–50.0)
Hemoglobin: 10 g/dL — ABNORMAL LOW (ref 13.2–17.1)
Lymphs Abs: 2237 cells/uL (ref 850–3900)
MCH: 26.5 pg — ABNORMAL LOW (ref 27.0–33.0)
MCHC: 31.6 g/dL — ABNORMAL LOW (ref 32.0–36.0)
MCV: 83.6 fL (ref 80.0–100.0)
MPV: 10.6 fL (ref 7.5–12.5)
Monocytes Relative: 7.5 %
Neutro Abs: 6096 cells/uL (ref 1500–7800)
Neutrophils Relative %: 63.5 %
Platelets: 482 10*3/uL — ABNORMAL HIGH (ref 140–400)
RBC: 3.78 10*6/uL — ABNORMAL LOW (ref 4.20–5.80)
RDW: 16.6 % — ABNORMAL HIGH (ref 11.0–15.0)
Total Lymphocyte: 23.3 %
WBC: 9.6 10*3/uL (ref 3.8–10.8)

## 2019-05-16 LAB — TSH: TSH: 5.04 mIU/L — ABNORMAL HIGH (ref 0.40–4.50)

## 2019-05-16 NOTE — Telephone Encounter (Signed)
Left VM asking for call back to scheduled exam.

## 2019-06-24 ENCOUNTER — Other Ambulatory Visit: Payer: Self-pay | Admitting: Physician Assistant

## 2019-07-23 NOTE — Progress Notes (Signed)
Assessment and Plan: Anemia, unspecified type -     Iron,Total/Total Iron Binding Cap -     Vitamin B12  CKD (chronic kidney disease) stage 2, GFR 60-89 ml/min -     COMPLETE METABOLIC PANEL WITH GFR Increase fluids, avoid NSAIDS, monitor sugars, will monitor  Chronic obstructive pulmonary disease, unspecified COPD type (HCC) No triggers, well controlled symptoms, cont to monitor  Medication management -     CBC with Differential/Platelet -     COMPLETE METABOLIC PANEL WITH GFR -     Magnesium  Mixed hyperlipidemia -     Lipid panel check lipids decrease fatty foods increase activity.   Essential hypertension -     TSH - continue medications, DASH diet, exercise and monitor at home. Call if greater than 130/80.   Abnormal glucose -     Hemoglobin A1c  Vitamin D deficiency -     VITAMIN D 25 Hydroxy (Vit-D Deficiency, Fractures)  Vitamin B12 deficiency -     Vitamin B12  Celiac disease Suggest doing gluten free diet  Hypothyroidism, unspecified type -     TSH - doing OTC medication, may need RX medication  Rash -     dexamethasone (DECADRON) injection 10 mg -     triamcinolone cream (KENALOG) 0.5 %; Apply 1 application topically 2 (two) times daily. - suggest doing gluten free diet - if not better will refer to Derm     Future Appointments  Date Time Provider [facility]  03/11/2020  3:00 PM Vicie Mutters, PA-C [facility] None    HPI 79 y.o.male with history of CKD stage 2, celiac disease presents for anemia, CKD follow up.   He had a rash, so the lasix was stopped and he states he continues to have a rash.  He is on maxide pill as needed a day for swelling and BP, doing well.  Lab Results  Component Value Date   GFRNONAA 80 07/24/2019   Last visit he was found to have iron def anemia.  Last colonoscopy: 2013, he is not on metformin, he is on PPI, patient has CKD and celiac diet controlled.  He had a negative hemoccult  Lab Results    Component Value Date   VITAMINB12 532 07/24/2019   Lab Results  Component Value Date   IRON 36 (L) 07/24/2019   TIBC 431 (H) 07/24/2019   FERRITIN 9 (L) 04/17/2019   Lab Results  Component Value Date   WBC 10.2 07/24/2019   HGB 10.4 (L) 07/24/2019   HCT 33.5 (L) 07/24/2019   MCV 84.8 07/24/2019   PLT 398 07/24/2019   He is on thyroid supplement from over the counter.  Lab Results  Component Value Date   TSH 6.11 (H) 07/24/2019   Chest xray was done due to cough, remote smoking history, showed abnormality that could be rib shadow. Will repeat CXR over next month- has repeated CXR? IMPRESSION: 1. No radiographic evidence of acute cardiopulmonary disease. 2. New nodular density projecting over the lower lateral right hemithorax seen only on a single view. This may be a confluence of rib shadows, however, follow-up standing PA and lateral chest radiograph is recommended in 1-2 months to ensure the stability or resolution of this finding.  Patient Active Problem List   Diagnosis Date Noted  . Anemia 03/13/2019  . Chronic venous insufficiency 03/12/2019  . CKD (chronic kidney disease) stage 2, 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/2015  . COPD (chronic obstructive pulmonary disease) ([place]) 12/08/2015  . Chronic pain  syndrome 05/26/2015  . DDD (degenerative disc disease), lumbar 05/26/2015  . Medication management 05/26/2015  . 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 Lumbago with sciatica 08/06/2014  . Hyperlipidemia 08/20/2013  . Hypertension   . Abnormal glucose   . Vitamin D deficiency   . Arthritis   . Vitamin B12 deficiency   . Celiac disease 06/19/2012  . GERD (gastroesophageal reflux disease) 04/30/2012  . 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 Hypothyroidism 04/30/2012  . 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     Current Outpatient Medications (Endocrine & Metabolic):  Marland Kitchen  Testosterone 10 MG/ACT (2%) GEL, Place 2 ML/mg topically each  day  Current Facility-Administered Medications (Endocrine & Metabolic):  .  dexamethasone (DECADRON) injection 10 mg  Current Outpatient Medications (Cardiovascular):  .  rosuvastatin (CRESTOR) 40 MG tablet, Take 1/2 to 1 tablet daily or as directed for Cholesterol .  triamterene-hydrochlorothiazide (MAXZIDE-25) 37.5-25 MG tablet, Take 1 tablet by mouth daily.     Current Outpatient Medications (Analgesics):  .  oxyCODONE (OXY IR/ROXICODONE) 5 MG immediate release tablet, TAKE 1 TABLET EVERY 4 HOURS AS NEEDED   Current Outpatient Medications (Hematological):  Marland Kitchen  Cyanocobalamin (VITAMIN B 12 PO), Take 2,500 mcg by mouth 2 (two) times daily.   Current Outpatient Medications (Other):  Marland Kitchen  Calcium Carbonate-Vit D-Min (CALCIUM 1200 PO), Take by mouth daily. .  Cholecalciferol (VITAMIN D PO), Take 5,000 Int'l Units by mouth daily. .  clotrimazole-betamethasone (LOTRISONE) cream, Apply 1 application topically 2 (two) times daily. .  hydrOXYzine (ATARAX/VISTARIL) 25 MG tablet, Take 1 tablet (25 mg total) by mouth 3 (three) times daily as needed for itching. Marland Kitchen  MAGNESIUM PO, Take 500 mg by mouth 2 (two) times daily.  Marland Kitchen  MILK THISTLE PO, Take 240 mg by mouth daily. .  Omega-3 Fatty Acids (OMEGA-3 FISH OIL PO), Take by mouth daily. Marland Kitchen  omeprazole (PRILOSEC) 20 MG capsule, Take 1 capsule (20 mg total) by mouth daily. Marland Kitchen  OVER THE COUNTER MEDICATION, Thyroid complex 2 tabs daily. Marland Kitchen  OVER THE COUNTER MEDICATION, Kelp with Iodine 1 daily .  OVER THE COUNTER MEDICATION, Tumeric Curcumin 500 mg 2 times daily .  OVER THE COUNTER MEDICATION, Chlor oxygen red blood builder 500 mg 2 capsules daily. .  Red Yeast Rice 600 MG CAPS, Take 600 mg by mouth 3 (three) times daily. .  Sennosides (SENOKOT PO), Take by mouth as needed. .  Wound Dressings (MEDIHONEY WOUND/BURN DRESSING) GEL, APPLY TO WOUND DAILY .  triamcinolone cream (KENALOG) 0.5 %, Apply 1 application topically 2 (two) times daily.   Allergies   Allergen Reactions  . Feraheme [Ferumoxytol] Other (See Comments)    Swelling of lips and tongue, could not talk 30 minutes after the infusion.  . Naprosyn [Naproxen] Swelling    Unsure if naprosyn caused this reaction  . Latex   . Levothyroxine     Lip swelling  . Doxycycline Nausea Only    ROS: all negative except above.   Physical Exam: Filed Weights   07/24/19 1441  Weight: 191 lb (86.6 kg)   BP (!) 142/76   Pulse 68   Temp 97.6 F (36.4 C)   Wt 191 lb (86.6 kg)   SpO2 98%   BMI 30.83 kg/m  General Appearance: Well nourished, in no apparent distress. Eyes: PERRLA, EOMs, conjunctiva no swelling or erythema Sinuses: No Frontal/maxillary tenderness ENT/Mouth: Ext aud canals clear, TMs without erythema, bulging. No erythema, swelling, or exudate on post pharynx.  Tonsils  not swollen or erythematous. Hearing normal.  Neck: Supple, thyroid normal.  Respiratory: Respiratory effort normal, BS equal bilaterally without rales, rhonchi, wheezing or stridor.  Cardio: RRR with no MRGs. Brisk peripheral pulses without edema.  Abdomen: Soft, + BS.  Non tender, no guarding, rebound, hernias, masses. Lymphatics: Non tender without lymphadenopathy.  Musculoskeletal: Full ROM, 5/5 strength, normal gait.  Skin: + papular rash along legs and arms.  Neuro: Cranial nerves intact. Normal muscle tone, no cerebellar symptoms. Sensation intact.  Psych: Awake and oriented X 3, normal affect, Insight and Judgment appropriate.         Vicie Mutters, PA-C 1:43 PM [facility]

## 2019-07-24 ENCOUNTER — Other Ambulatory Visit: Payer: Self-pay

## 2019-07-24 ENCOUNTER — Ambulatory Visit (INDEPENDENT_AMBULATORY_CARE_PROVIDER_SITE_OTHER): Payer: Medicare Other | Admitting: Physician Assistant

## 2019-07-24 ENCOUNTER — Encounter: Payer: Self-pay | Admitting: Physician Assistant

## 2019-07-24 VITALS — BP 142/76 | HR 68 | Temp 97.6°F | Wt 191.0 lb

## 2019-07-24 DIAGNOSIS — N182 Chronic kidney disease, stage 2 (mild): Secondary | ICD-10-CM

## 2019-07-24 DIAGNOSIS — I10 Essential (primary) hypertension: Secondary | ICD-10-CM

## 2019-07-24 DIAGNOSIS — R21 Rash and other nonspecific skin eruption: Secondary | ICD-10-CM

## 2019-07-24 DIAGNOSIS — K900 Celiac disease: Secondary | ICD-10-CM

## 2019-07-24 DIAGNOSIS — D649 Anemia, unspecified: Secondary | ICD-10-CM | POA: Diagnosis not present

## 2019-07-24 DIAGNOSIS — E782 Mixed hyperlipidemia: Secondary | ICD-10-CM

## 2019-07-24 DIAGNOSIS — Z79899 Other long term (current) drug therapy: Secondary | ICD-10-CM

## 2019-07-24 DIAGNOSIS — J449 Chronic obstructive pulmonary disease, unspecified: Secondary | ICD-10-CM | POA: Diagnosis not present

## 2019-07-24 DIAGNOSIS — E039 Hypothyroidism, unspecified: Secondary | ICD-10-CM

## 2019-07-24 DIAGNOSIS — R7309 Other abnormal glucose: Secondary | ICD-10-CM

## 2019-07-24 DIAGNOSIS — E559 Vitamin D deficiency, unspecified: Secondary | ICD-10-CM

## 2019-07-24 DIAGNOSIS — E538 Deficiency of other specified B group vitamins: Secondary | ICD-10-CM

## 2019-07-24 MED ORDER — DEXAMETHASONE SODIUM PHOSPHATE 100 MG/10ML IJ SOLN: 10.0000 mg | Freq: Once | INTRAMUSCULAR | Status: DC

## 2019-07-24 MED ORDER — TRIAMCINOLONE ACETONIDE 0.5 % EX CREA: 1.0000 "application " | TOPICAL_CREAM | Freq: Two times a day (BID) | CUTANEOUS | 2 refills | Status: DC

## 2019-07-24 NOTE — Patient Instructions (Addendum)
INFORMATION ABOUT YOUR XRAY  Can walk into [address] for an Insurance account manager. They will have the order and take you back. You do not any paper work, I should get the result back today or tomorrow. This order is good for a year.  Can call [phone] to schedule an appointment if you wish.   If you are still having an issue we will send you to Derm   Rash, Adult A rash is a change in the color of your skin. A rash can also change the way your skin feels. There are many different conditions and factors that can cause a rash. Some rashes may disappear after a few days, but some may last for a few weeks. Common causes of rashes include:  Viral infections, such as: ? Colds. ? Measles. ? Hand, foot, and mouth disease.  Bacterial infections, such as: ? Scarlet fever. ? Impetigo.  Fungal infections, such as Candida.  Allergic reactions to food, medicines, or skin care products. Follow these instructions at home: The goal of treatment is to stop the itching and keep the rash from spreading. Pay attention to any changes in your symptoms. Follow these instructions to help with your condition: Medicine Take or apply over-the-counter and prescription medicines only as told by your health care provider. These may include:  Corticosteroid creams to treat red or swollen skin.  Anti-itch lotions.  Oral allergy medicines (antihistamines).  Oral corticosteroids for severe symptoms.  Skin care  Apply cool compresses to the affected areas.  Do not scratch or rub your skin.  Avoid covering the rash. Make sure the rash is exposed to air as much as possible. Managing itching and discomfort  Avoid hot showers or baths, which can make itching worse. A cold shower may help.  Try taking a bath with: ? Epsom salts. Follow manufacturer instructions on the packaging. You can get these at your local pharmacy or grocery store. ? Baking soda. Pour a small amount into the bath as told by your  health care provider. ? Colloidal oatmeal. Follow manufacturer instructions on the packaging. You can get this at your local pharmacy or grocery store.  Try applying baking soda paste to your skin. Stir water into baking soda until it reaches a paste-like consistency.  Try applying calamine lotion. This is an over-the-counter lotion that helps to relieve itchiness.  Keep cool and out of the sun. Sweating and being hot can make itching worse. General instructions   Rest as needed.  Drink enough fluid to keep your urine pale yellow.  Wear loose-fitting clothing.  Avoid scented soaps, detergents, and perfumes. Use gentle soaps, detergents, perfumes, and other cosmetic products.  Avoid any substance that causes your rash. Keep a journal to help track what causes your rash. Write down: ? What you eat. ? What cosmetic products you use. ? What you drink. ? What you wear. This includes jewelry.  Keep all follow-up visits as told by your health care provider. This is important. Contact a health care provider if:  You sweat at night.  You lose weight.  You urinate more than normal.  You urinate less than normal, or you notice that your urine is a darker color than usual.  You feel weak.  You vomit.  Your skin or the whites of your eyes look yellow (jaundice).  Your skin: ? Tingles. ? Is numb.  Your rash: ? Does not go away after several days. ? Gets worse.  You are: ? Unusually thirsty. ?  More tired than normal.  You have: ? New symptoms. ? Pain in your abdomen. ? A fever. ? Diarrhea. Get help right away if you:  Have a fever and your symptoms suddenly get worse.  Develop confusion.  Have a severe headache or a stiff neck.  Have severe joint pains or stiffness.  Have a seizure.  Develop a rash that covers all or most of your body. The rash may or may not be painful.  Develop blisters that: ? Are on top of the rash. ? Grow larger or grow  together. ? Are painful. ? Are inside your nose or mouth.  Develop a rash that: ? Looks like purple pinprick-sized spots all over your body. ? Has a "bull's eye" or looks like a target. ? Is not related to sun exposure, is red and painful, and causes your skin to peel. Summary  A rash is a change in the color of your skin. Some rashes disappear after a few days, but some may last for a few weeks.  The goal of treatment is to stop the itching and keep the rash from spreading.  Take or apply over-the-counter and prescription medicines only as told by your health care provider.  Contact a health care provider if you have new or worsening symptoms.  Keep all follow-up visits as told by your health care provider. This is important. This information is not intended to replace advice given to you by your health care provider. Make sure you discuss any questions you have with your health care provider. Document Revised: 09/20/2018 Document Reviewed: 12/31/2017 Elsevier Patient Education  [place].

## 2019-07-25 LAB — LIPID PANEL
Cholesterol: 153 mg/dL (ref <200)
HDL: 31 mg/dL — ABNORMAL LOW (ref >=40)
LDL Cholesterol (Calc): 101 mg/dL (calc) — ABNORMAL HIGH
Non-HDL Cholesterol (Calc): 122 mg/dL (calc) (ref <130)
Total CHOL/HDL Ratio: 4.9 (calc) (ref <5.0)
Triglycerides: 109 mg/dL (ref <150)

## 2019-07-25 LAB — HEMOGLOBIN A1C
Hgb A1c MFr Bld: 6.1 % of total Hgb — ABNORMAL HIGH (ref <5.7)
Mean Plasma Glucose: 128 (calc)
eAG (mmol/L): 7.1 (calc)

## 2019-07-25 LAB — COMPLETE METABOLIC PANEL WITH GFR
AG Ratio: 1.3 (calc) (ref 1.0–2.5)
ALT: 15 U/L (ref 9–46)
AST: 22 U/L (ref 10–35)
Albumin: 4.1 g/dL (ref 3.6–5.1)
Alkaline phosphatase (APISO): 75 U/L (ref 35–144)
BUN: 12 mg/dL (ref 7–25)
CO2: 30 mmol/L (ref 20–32)
Calcium: 9.4 mg/dL (ref 8.6–10.3)
Chloride: 104 mmol/L (ref 98–110)
Creat: 0.91 mg/dL (ref 0.70–1.18)
GFR, Est African American: 93 mL/min/{1.73_m2} (ref >=60)
GFR, Est Non African American: 80 mL/min/{1.73_m2} (ref >=60)
Globulin: 3.2 g/dL (calc) (ref 1.9–3.7)
Glucose, Bld: 86 mg/dL (ref 65–99)
Potassium: 4.5 mmol/L (ref 3.5–5.3)
Sodium: 140 mmol/L (ref 135–146)
Total Bilirubin: 1 mg/dL (ref 0.2–1.2)
Total Protein: 7.3 g/dL (ref 6.1–8.1)

## 2019-07-25 LAB — CBC WITH DIFFERENTIAL/PLATELET
Absolute Monocytes: 632 cells/uL (ref 200–950)
Basophils Absolute: 102 cells/uL (ref 0–200)
Basophils Relative: 1 %
Eosinophils Absolute: 796 cells/uL — ABNORMAL HIGH (ref 15–500)
Eosinophils Relative: 7.8 %
HCT: 33.5 % — ABNORMAL LOW (ref 38.5–50.0)
Hemoglobin: 10.4 g/dL — ABNORMAL LOW (ref 13.2–17.1)
Lymphs Abs: 2377 cells/uL (ref 850–3900)
MCH: 26.3 pg — ABNORMAL LOW (ref 27.0–33.0)
MCHC: 31 g/dL — ABNORMAL LOW (ref 32.0–36.0)
MCV: 84.8 fL (ref 80.0–100.0)
MPV: 10.2 fL (ref 7.5–12.5)
Monocytes Relative: 6.2 %
Neutro Abs: 6293 cells/uL (ref 1500–7800)
Neutrophils Relative %: 61.7 %
Platelets: 398 10*3/uL (ref 140–400)
RBC: 3.95 10*6/uL — ABNORMAL LOW (ref 4.20–5.80)
RDW: 16.9 % — ABNORMAL HIGH (ref 11.0–15.0)
Total Lymphocyte: 23.3 %
WBC: 10.2 10*3/uL (ref 3.8–10.8)

## 2019-07-25 LAB — MAGNESIUM: Magnesium: 1.9 mg/dL (ref 1.5–2.5)

## 2019-07-25 LAB — VITAMIN B12: Vitamin B-12: 532 pg/mL (ref 200–1100)

## 2019-07-25 LAB — VITAMIN D 25 HYDROXY (VIT D DEFICIENCY, FRACTURES): Vit D, 25-Hydroxy: 35 ng/mL (ref 30–100)

## 2019-07-25 LAB — IRON, TOTAL/TOTAL IRON BINDING CAP
%SAT: 8 % (calc) — ABNORMAL LOW (ref 20–48)
Iron: 36 ug/dL — ABNORMAL LOW (ref 50–180)
TIBC: 431 mcg/dL (calc) — ABNORMAL HIGH (ref 250–425)

## 2019-07-25 LAB — TSH: TSH: 6.11 mIU/L — ABNORMAL HIGH (ref 0.40–4.50)

## 2019-08-22 ENCOUNTER — Ambulatory Visit
Admission: RE | Admit: 2019-08-22 | Discharge: 2019-08-22 | Disposition: A | Discharge status: Home / Self Care | Payer: Medicare Other | Source: Ambulatory Visit | Attending: Physician Assistant | Admitting: Physician Assistant

## 2019-08-22 DIAGNOSIS — R0609 Other forms of dyspnea: Secondary | ICD-10-CM

## 2019-08-22 DIAGNOSIS — R9389 Abnormal findings on diagnostic imaging of other specified body structures: Secondary | ICD-10-CM

## 2019-08-22 DIAGNOSIS — Z87891 Personal history of nicotine dependence: Secondary | ICD-10-CM

## 2019-08-22 DIAGNOSIS — J449 Chronic obstructive pulmonary disease, unspecified: Secondary | ICD-10-CM

## 2019-09-16 ENCOUNTER — Other Ambulatory Visit: Payer: Self-pay | Admitting: Physician Assistant

## 2019-09-16 DIAGNOSIS — L309 Dermatitis, unspecified: Secondary | ICD-10-CM

## 2019-09-17 ENCOUNTER — Other Ambulatory Visit: Payer: Self-pay | Admitting: Physician Assistant

## 2019-12-11 ENCOUNTER — Other Ambulatory Visit: Payer: Self-pay | Admitting: Physician Assistant

## 2019-12-11 DIAGNOSIS — E349 Endocrine disorder, unspecified: Secondary | ICD-10-CM

## 2019-12-11 MED ORDER — TESTOSTERONE 10 MG/ACT (2%) TD GEL: TRANSDERMAL | 1 refills | Status: DC

## 2019-12-11 NOTE — Progress Notes (Signed)
Future Appointments  Date Time Provider [place]  03/11/2020  3:00 PM Vicie Mutters, PA-C [id number] None

## 2020-03-10 NOTE — Progress Notes (Signed)
MEDICARE ANNUAL WELLNESS VISIT AND CPE  Assessment:    Essential hypertension - continue medications, DASH diet, exercise and monitor at home. Call if greater than 130/80.  - add on lasix 40 mg to take as needed -     CBC with Differential/Platelet -     Hepatic function panel -     BASIC METABOLIC PANEL WITH GFR  Chronic obstructive pulmonary disease, unspecified COPD type ([place]) Avoid triggers, symptoms stable at this time  Gastroesophageal reflux disease, esophagitis presence not specified Continue PPI/H2 blocker, diet discussed  Celiac disease Continue GI followup, diet controlled  Hypothyroidism, unspecified type Has been on OTC medication that is not working, has been having  -     TSH  DDD (degenerative disc disease), lumbar Continue pain management  Arthritis Continue pain management   CKD (chronic kidney disease) stage 2, GFR 60-89 ml/min Increase fluids, avoid NSAIDS, monitor sugars, will monitor -     Hepatic function panel  Abnormal glucose Discussed general issues about diabetes pathophysiology and management., Educational material distributed., Suggested low cholesterol diet., Encouraged aerobic exercise., Discussed foot care., Reminded to get yearly retinal exam. -     Hemoglobin A1c  Vitamin D deficiency Continue supplement  Vitamin B12 deficiency Check B12 next OV  Mixed hyperlipidemia -continue medications, check lipids, decrease fatty foods, increase activity.  -     Lipid panel  Left-sided low back pain with sciatica, sciatica laterality unspecified, unspecified chronicity Continue pain managament  Chronic pain syndrome Continue pain managament  Medication management -     Magnesium  Testosterone deficiency  check testosterone levels as needed.   Venous insuff.  Follow up Dr. Donnetta Hutching as needed Wear compression socks and elevate.  Will add on low dose lasix to take as needed, follow up 3 months  Encounter for Medicare annual wellness  exam 1 year  Over 40 minutes of exam, counseling, chart review and critical decision making was performed  Future Appointments  Date Time Provider [place]  03/14/2021  3:00 PM Garnet Sierras, NP [facility] None     Plan:   During the course of the visit the patient was educated and counseled about appropriate screening and preventive services including:    Pneumococcal vaccine  Prevnar 13   Influenza vaccine  Td vaccine  Screening electrocardiogram  Bone densitometry screening  Colorectal cancer screening  Diabetes screening  Glaucoma screening  Nutrition counseling   Advanced directives: requested   Subjective:  [name] Fernandez is a 79 y.o. male who presents for Medicare Annual Wellness Visit and follow up   His blood pressure has been controlled at home, today their BP is BP: (!) 148/84  He does not workout due to pain/swelling in his legs. He denies chest pain, shortness of breath, dizziness.  He is not on cholesterol medication, was suppose to be on crestor but stopped it and he is on RYRE. His cholesterol is at goal. The cholesterol last visit was:   Lab Results  Component Value Date   CHOL 153 07/24/2019   HDL 31 (L) 07/24/2019   LDLCALC 101 (H) 07/24/2019   TRIG 109 07/24/2019   CHOLHDL 4.9 07/24/2019    He has been working on diet and exercise for prediabetes, and denies paresthesia of the feet, polydipsia, polyuria and visual disturbances. Last A1C in the office was:  Lab Results  Component Value Date   HGBA1C 6.1 (H) 07/24/2019   Patient is on Vitamin D supplement.   Lab Results  Component Value  Date   VD25OH 35 07/24/2019     BMI is Body mass index is 29.6 kg/m., he is working on diet and exercise. Will have some SOB with exertion, unchanged, no cough or wheezing with it. No CP or other accompaniments with it.  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 Hardin Negus for pain management, on oxycodone 5 mg 1-3 x a day.   He has had venous ulcer left medial leg, following with wound center, putting medihoney and providine. Use compression socks but occ have to not wear them due to itching/irritation.  He was on zaroxyln at one point but since he has cut back on his oxycodone    Lab Results  Component Value Date   GFRNONAA 94 07/24/2019   He is on OTC thyroid medication complex. His medication was changed last visit. Lab Results  Component Value Date   TSH 6.11 (H) 07/24/2019  .  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 He states that the testosterone helps with his energy, libido, muscle mass. Lab Results  Component Value Date   TESTOSTERONE 417 07/31/2017     Medication Review:  Current Outpatient Medications (Endocrine & Metabolic):  Marland Kitchen  Testosterone 10 MG/ACT (2%) GEL, Place 2 ML/mg topically each day  Current Outpatient Medications (Cardiovascular):  .  triamterene-hydrochlorothiazide (MAXZIDE-25) 37.5-25 MG tablet, Take 1 tablet by mouth daily.   Current Outpatient Medications (Analgesics):  .  oxyCODONE (OXY IR/ROXICODONE) 5 MG immediate release tablet, TAKE 1 TABLET EVERY 4 HOURS AS NEEDED  Current Outpatient Medications (Hematological):  Marland Kitchen  Cyanocobalamin (VITAMIN B 12 PO), Take 2,500 mcg by mouth 2 (two) times daily.  Current Outpatient Medications (Other):  Marland Kitchen  Calcium Carbonate-Vit D-Min (CALCIUM 1200 PO), Take by mouth daily. .  Cholecalciferol (VITAMIN D PO), Take 5,000 Int'l Units by mouth daily. .  clotrimazole-betamethasone (LOTRISONE) cream, APPLY TO AFFECTED AREA TWICE A DAY .  hydrOXYzine (ATARAX/VISTARIL) 25 MG tablet, TAKE 1 TABLET (25 MG TOTAL) BY MOUTH 3 (THREE) TIMES DAILY AS NEEDED FOR ITCHING. .  MAGNESIUM PO, Take 500 mg by mouth 2 (two)  times daily.  Marland Kitchen  MILK THISTLE PO, Take 240 mg by mouth daily. .  Omega-3 Fatty Acids (OMEGA-3 FISH OIL PO), Take by mouth daily. Marland Kitchen  omeprazole (PRILOSEC) 20 MG capsule, Take 1 capsule (20 mg total) by mouth daily. Marland Kitchen  OVER THE COUNTER MEDICATION, Thyroid complex 2 tabs daily. Marland Kitchen  OVER THE COUNTER MEDICATION, Kelp with Iodine 1 daily .  OVER THE COUNTER MEDICATION, Tumeric Curcumin 500 mg 2 times daily .  OVER THE COUNTER MEDICATION, Chlor oxygen red blood builder 500 mg 2 capsules daily. .  Red Yeast Rice 600 MG CAPS, Take 600 mg by mouth 3 (three) times daily. .  Sennosides (SENOKOT PO), Take by mouth as needed. .  triamcinolone cream (KENALOG) 0.5 %, Apply 1 application topically 2 (two) times daily. .  Wound Dressings (MEDIHONEY WOUND/BURN DRESSING) GEL, APPLY TO WOUND DAILY  Allergies: Allergies  Allergen Reactions  . Feraheme [Ferumoxytol] Other (See Comments)    Swelling of lips and tongue, could not talk 30 minutes after the infusion.  . Naprosyn [Naproxen] Swelling    Unsure if naprosyn caused this reaction  . Latex   .  Levothyroxine     Lip swelling  . Doxycycline Nausea Only    Current Problems (verified) Patient Active Problem List   Diagnosis Date Noted  . Anemia 03/13/2019  . Chronic venous insufficiency 03/12/2019  . CKD (chronic kidney disease) stage 2, 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/2015  . COPD (chronic obstructive pulmonary disease) ([place]) 12/08/2015  . Chronic pain syndrome 05/26/2015  . DDD (degenerative disc disease), lumbar 05/26/2015  . Medication management 05/26/2015  . 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 Lumbago with sciatica 08/06/2014  . Hyperlipidemia 08/20/2013  . Hypertension   . Abnormal glucose   . Vitamin D deficiency   . Arthritis   . Vitamin B12 deficiency   . Celiac disease 06/19/2012  . GERD (gastroesophageal reflux disease) 04/30/2012  . 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 Hypothyroidism 04/30/2012   . 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 DT (Pediatric) 01/12/2011  . Fluad Quad(high Dose 65+) 02/14/2019  . Influenza, High Dose Seasonal PF 05/20/2014, 05/26/2015, 03/20/2018  . Influenza,inj,Quad PF,6+ Mos 03/20/2018  . Influenza-Unspecified 04/30/2013, 03/29/2016, 03/02/2017  . Pneumococcal Conjugate-13 05/20/2014  . Pneumococcal Polysaccharide-23 03/29/2016  . Pneumococcal-Unspecified 05/20/2008  . Zoster 07/15/2014   Health Maintenance  Topic Date Due  . Hepatitis C Screening  Never done  . COVID-19 Vaccine (1) Never done  . FOOT EXAM  06/16/2017  . OPHTHALMOLOGY EXAM  07/11/2017  . URINE MICROALBUMIN  07/31/2018  . INFLUENZA VACCINE  01/11/2020  . HEMOGLOBIN A1C  01/21/2020  . TETANUS/TDAP  01/11/2021  . PNA vac Low Risk Adult  Completed    Tetanus: 2012  Pneumovax: 2017 Prevnar 13: 2015 Flu vaccine: 2020 Zostavax: 2016 COVID declines  Colonoscopy: 05/2012 Dr. Hilarie Fredrickson  Last one EGD: 05/2012  Names of Other Physician/Practitioners you currently use: 1. [facility] here for primary care 2. Lindalou Hose, eye doctor, last visit 07/2016 Patient Care Team: Unk Pinto, MD as PCP - General (Internal Medicine) Nicholaus Bloom, MD as Consulting Physician (Anesthesiology) Hilarie Fredrickson, Lajuan Lines, MD as Consulting Physician (Gastroenterology)  Surgical:  has a past surgical history that includes Joint replacement (04/2010); Back surgery; Tonsillectomy; Eye surgery; Hiatal hernia repair; Hernia repair (1950's); and Total knee revision (11/20/2011). Family His family history includes Esophageal cancer in his father. Social history  He reports that he quit smoking about 39 years ago. His smoking use included cigarettes. He has a 20.00 pack-year smoking history. He has never used smokeless tobacco. He reports that he does not drink alcohol and does not use  drugs.  MEDICARE WELLNESS OBJECTIVES: Physical activity: Current Exercise Habits: The patient does not participate in regular exercise at present Cardiac risk factors: Cardiac Risk Factors include: advanced age (>78mn, >>37women);dyslipidemia;hypertension;male gender;sedentary lifestyle;obesity (BMI >30kg/m2) Depression/mood screen:   [facility] 03/11/2020  Decreased Interest 0  Down, Depressed, Hopeless 0  PHQ - 2 Score 0  Altered sleeping -  Tired, decreased energy -  Change in appetite -  Feeling bad or failure about yourself  -  Trouble concentrating -  Moving slowly or fidgety/restless -  Suicidal thoughts -  PHQ-9 Score -    ADLs:  In your present state of health, do you have any difficulty performing the following activities: 03/11/2020 03/12/2019  Hearing? N N  Vision? N N  Difficulty concentrating or making decisions? Y N  Walking or climbing stairs? N Y  Dressing or bathing? N N  Doing errands,  shopping? N N  Some recent data might be hidden    Cognitive Testing  Alert? Yes  Normal Appearance?Yes  Oriented to person? Yes  Place? Yes   Time? Yes  Recall of three objects?  2/3  Can perform simple calculations? Yes  Displays appropriate judgment?Yes  Can read the correct time from a watch face?Yes  EOL planning: Does Patient Have a Medical Advance Directive?: Yes Type of Advance Directive: Healthcare Power of Attorney, Living will Does patient want to make changes to medical advance directive?: No - Patient declined  Review of Systems  Constitutional: Negative.   HENT: Negative for congestion, ear discharge, ear pain, hearing loss, nosebleeds, sore throat and tinnitus.   Eyes: Negative.   Respiratory: Negative for cough, hemoptysis, sputum production, shortness of breath, wheezing and stridor.   Cardiovascular: Positive for leg swelling. Negative for chest pain, palpitations, orthopnea, claudication and PND.  Gastrointestinal: Negative for abdominal  pain, blood in stool, constipation, diarrhea, heartburn, melena, nausea and vomiting.  Genitourinary: Negative for dysuria, flank pain, frequency, hematuria and urgency.  Musculoskeletal: Positive for back pain, joint pain and myalgias. Negative for falls and neck pain.  Skin: Negative for itching and rash.  Neurological: Negative for dizziness, tingling, tremors, sensory change, speech change, focal weakness, seizures, loss of consciousness and headaches.  Endo/Heme/Allergies: Negative.   Psychiatric/Behavioral: Negative.      Objective:     Today's Vitals   03/11/20 1500  BP: (!) 148/84  Pulse: 72  Resp: 16  Temp: (!) 97 F (36.1 C)  SpO2: 96%  Weight: 182 lb (82.6 kg)  Height: 5' 5.75" (1.67 m)   Body mass index is 29.6 kg/m.  General appearance: alert, no distress, WD/WN, male HEENT: normocephalic, sclerae anicteric, TMs pearly, nares patent, no discharge or erythema, pharynx normal Oral cavity: MMM, no lesions Neck: supple, no lymphadenopathy, no thyromegaly, no masses Heart: RRR, normal S1, S2, no murmurs Lungs: CTA bilaterally, no wheezes, rhonchi, or rales Abdomen: +bs, soft, non tender, non distended, no masses, no hepatomegaly, no splenomegaly Musculoskeletal: nontender, no swelling, no obvious deformity Extremities: 2+ edema, left lower medial leg with erythema, no visible ulcer or break down, no cyanosis, no clubbing Pulses: 2+ symmetric, upper and lower extremities, normal cap refill Neurological: alert, oriented x 3, CN2-12 intact, strength normal upper extremities and lower extremities,  DTRs 2+ throughout, no cerebellar signs, gait Normal and Antalgic Psychiatric: normal affect, behavior normal, pleasant   Medicare Attestation I have personally reviewed: The patient's medical and social history Their use of alcohol, tobacco or illicit drugs Their current medications and supplements The patient's functional ability including ADLs,fall risks, home safety  risks, cognitive, and hearing and visual impairment Diet and physical activities Evidence for depression or mood disorders  The patient's weight, height, BMI, and visual acuity have been recorded in the chart.  I have made referrals, counseling, and provided education to the patient based on review of the above and I have provided the patient with a written personalized care plan for preventive services.     Vicie Mutters, PA-C   03/11/2020

## 2020-03-11 ENCOUNTER — Ambulatory Visit (INDEPENDENT_AMBULATORY_CARE_PROVIDER_SITE_OTHER): Payer: Medicare Other | Admitting: Physician Assistant

## 2020-03-11 ENCOUNTER — Other Ambulatory Visit: Payer: Self-pay

## 2020-03-11 VITALS — BP 148/84 | HR 72 | Temp 97.0°F | Resp 16 | Ht 65.75 in | Wt 182.0 lb

## 2020-03-11 DIAGNOSIS — E349 Endocrine disorder, unspecified: Secondary | ICD-10-CM

## 2020-03-11 DIAGNOSIS — D649 Anemia, unspecified: Secondary | ICD-10-CM

## 2020-03-11 DIAGNOSIS — Z79899 Other long term (current) drug therapy: Secondary | ICD-10-CM

## 2020-03-11 DIAGNOSIS — Z0000 Encounter for general adult medical examination without abnormal findings: Secondary | ICD-10-CM

## 2020-03-11 DIAGNOSIS — G894 Chronic pain syndrome: Secondary | ICD-10-CM

## 2020-03-11 DIAGNOSIS — E039 Hypothyroidism, unspecified: Secondary | ICD-10-CM | POA: Diagnosis not present

## 2020-03-11 DIAGNOSIS — K219 Gastro-esophageal reflux disease without esophagitis: Secondary | ICD-10-CM

## 2020-03-11 DIAGNOSIS — K900 Celiac disease: Secondary | ICD-10-CM

## 2020-03-11 DIAGNOSIS — M5442 Lumbago with sciatica, left side: Secondary | ICD-10-CM

## 2020-03-11 DIAGNOSIS — Z136 Encounter for screening for cardiovascular disorders: Secondary | ICD-10-CM

## 2020-03-11 DIAGNOSIS — Z0001 Encounter for general adult medical examination with abnormal findings: Secondary | ICD-10-CM

## 2020-03-11 DIAGNOSIS — I872 Venous insufficiency (chronic) (peripheral): Secondary | ICD-10-CM

## 2020-03-11 DIAGNOSIS — R7309 Other abnormal glucose: Secondary | ICD-10-CM

## 2020-03-11 DIAGNOSIS — E559 Vitamin D deficiency, unspecified: Secondary | ICD-10-CM

## 2020-03-11 DIAGNOSIS — I10 Essential (primary) hypertension: Secondary | ICD-10-CM

## 2020-03-11 DIAGNOSIS — J449 Chronic obstructive pulmonary disease, unspecified: Secondary | ICD-10-CM

## 2020-03-11 DIAGNOSIS — E538 Deficiency of other specified B group vitamins: Secondary | ICD-10-CM

## 2020-03-11 DIAGNOSIS — R6889 Other general symptoms and signs: Secondary | ICD-10-CM | POA: Diagnosis not present

## 2020-03-11 DIAGNOSIS — N182 Chronic kidney disease, stage 2 (mild): Secondary | ICD-10-CM

## 2020-03-11 DIAGNOSIS — E782 Mixed hyperlipidemia: Secondary | ICD-10-CM

## 2020-03-12 LAB — CBC WITH DIFFERENTIAL/PLATELET
Absolute Monocytes: 783 cells/uL (ref 200–950)
Basophils Absolute: 77 cells/uL (ref 0–200)
Basophils Relative: 0.9 %
Eosinophils Absolute: 370 cells/uL (ref 15–500)
Eosinophils Relative: 4.3 %
HCT: 38.2 % — ABNORMAL LOW (ref 38.5–50.0)
Hemoglobin: 13 g/dL — ABNORMAL LOW (ref 13.2–17.1)
Lymphs Abs: 2331 cells/uL (ref 850–3900)
MCH: 31.1 pg (ref 27.0–33.0)
MCHC: 34 g/dL (ref 32.0–36.0)
MCV: 91.4 fL (ref 80.0–100.0)
MPV: 11.1 fL (ref 7.5–12.5)
Monocytes Relative: 9.1 %
Neutro Abs: 5040 cells/uL (ref 1500–7800)
Neutrophils Relative %: 58.6 %
Platelets: 360 10*3/uL (ref 140–400)
RBC: 4.18 10*6/uL — ABNORMAL LOW (ref 4.20–5.80)
RDW: 15.3 % — ABNORMAL HIGH (ref 11.0–15.0)
Total Lymphocyte: 27.1 %
WBC: 8.6 10*3/uL (ref 3.8–10.8)

## 2020-03-12 LAB — COMPLETE METABOLIC PANEL WITH GFR
AG Ratio: 1.3 (calc) (ref 1.0–2.5)
ALT: 22 U/L (ref 9–46)
AST: 28 U/L (ref 10–35)
Albumin: 4.3 g/dL (ref 3.6–5.1)
Alkaline phosphatase (APISO): 76 U/L (ref 35–144)
BUN: 11 mg/dL (ref 7–25)
CO2: 27 mmol/L (ref 20–32)
Calcium: 9.4 mg/dL (ref 8.6–10.3)
Chloride: 101 mmol/L (ref 98–110)
Creat: 0.91 mg/dL (ref 0.70–1.18)
GFR, Est African American: 93 mL/min/{1.73_m2} (ref >=60)
GFR, Est Non African American: 80 mL/min/{1.73_m2} (ref >=60)
Globulin: 3.2 g/dL (calc) (ref 1.9–3.7)
Glucose, Bld: 117 mg/dL — ABNORMAL HIGH (ref 65–99)
Potassium: 4.7 mmol/L (ref 3.5–5.3)
Sodium: 136 mmol/L (ref 135–146)
Total Bilirubin: 0.9 mg/dL (ref 0.2–1.2)
Total Protein: 7.5 g/dL (ref 6.1–8.1)

## 2020-03-12 LAB — URINALYSIS, ROUTINE W REFLEX MICROSCOPIC
Bilirubin Urine: NEGATIVE
Glucose, UA: NEGATIVE
Hgb urine dipstick: NEGATIVE
Ketones, ur: NEGATIVE
Leukocytes,Ua: NEGATIVE
Nitrite: NEGATIVE
Protein, ur: NEGATIVE
Specific Gravity, Urine: 1.012 (ref 1.001–1.03)
pH: 6 (ref 5.0–8.0)

## 2020-03-12 LAB — HEMOGLOBIN A1C
Hgb A1c MFr Bld: 6 % of total Hgb — ABNORMAL HIGH (ref <5.7)
Mean Plasma Glucose: 126 (calc)
eAG (mmol/L): 7 (calc)

## 2020-03-12 LAB — LIPID PANEL
Cholesterol: 160 mg/dL (ref <200)
HDL: 35 mg/dL — ABNORMAL LOW (ref >=40)
LDL Cholesterol (Calc): 100 mg/dL (calc) — ABNORMAL HIGH
Non-HDL Cholesterol (Calc): 125 mg/dL (calc) (ref <130)
Total CHOL/HDL Ratio: 4.6 (calc) (ref <5.0)
Triglycerides: 152 mg/dL — ABNORMAL HIGH (ref <150)

## 2020-03-12 LAB — TESTOSTERONE: Testosterone: 256 ng/dL (ref 250–827)

## 2020-03-12 LAB — MICROALBUMIN / CREATININE URINE RATIO
Creatinine, Urine: 75 mg/dL (ref 20–320)
Microalb Creat Ratio: 33 mcg/mg creat — ABNORMAL HIGH (ref <30)
Microalb, Ur: 2.5 mg/dL

## 2020-03-12 LAB — MAGNESIUM: Magnesium: 2 mg/dL (ref 1.5–2.5)

## 2020-03-12 LAB — TSH: TSH: 2.57 mIU/L (ref 0.40–4.50)

## 2020-03-17 ENCOUNTER — Telehealth: Payer: Self-pay | Admitting: *Deleted

## 2020-03-17 NOTE — Telephone Encounter (Signed)
Per Dr Melford Aase, no new RX for Hydroxychloroquine. The patient would have to have rheumatoid arthritis to receive the medication.

## 2020-05-19 ENCOUNTER — Other Ambulatory Visit: Payer: Self-pay | Admitting: Internal Medicine

## 2020-05-19 DIAGNOSIS — E349 Endocrine disorder, unspecified: Secondary | ICD-10-CM

## 2020-05-19 MED ORDER — TESTOSTERONE 10 MG/ACT (2%) TD GEL: TRANSDERMAL | 1 refills | Status: DC

## 2020-06-17 ENCOUNTER — Encounter: Payer: Self-pay | Admitting: Adult Health Nurse Practitioner

## 2020-06-17 ENCOUNTER — Other Ambulatory Visit: Payer: Self-pay

## 2020-06-17 ENCOUNTER — Ambulatory Visit (INDEPENDENT_AMBULATORY_CARE_PROVIDER_SITE_OTHER): Payer: Medicare Other | Admitting: Adult Health Nurse Practitioner

## 2020-06-17 VITALS — BP 124/76 | HR 71 | Temp 97.9°F | Ht 65.0 in | Wt 187.0 lb

## 2020-06-17 DIAGNOSIS — E039 Hypothyroidism, unspecified: Secondary | ICD-10-CM | POA: Diagnosis not present

## 2020-06-17 DIAGNOSIS — N182 Chronic kidney disease, stage 2 (mild): Secondary | ICD-10-CM | POA: Diagnosis not present

## 2020-06-17 DIAGNOSIS — R7309 Other abnormal glucose: Secondary | ICD-10-CM

## 2020-06-17 DIAGNOSIS — M51369 Other intervertebral disc degeneration, lumbar region without mention of lumbar back pain or lower extremity pain: Secondary | ICD-10-CM

## 2020-06-17 DIAGNOSIS — M25511 Pain in right shoulder: Secondary | ICD-10-CM

## 2020-06-17 DIAGNOSIS — K900 Celiac disease: Secondary | ICD-10-CM | POA: Diagnosis not present

## 2020-06-17 DIAGNOSIS — K219 Gastro-esophageal reflux disease without esophagitis: Secondary | ICD-10-CM | POA: Diagnosis not present

## 2020-06-17 DIAGNOSIS — M5442 Lumbago with sciatica, left side: Secondary | ICD-10-CM

## 2020-06-17 DIAGNOSIS — Z23 Encounter for immunization: Secondary | ICD-10-CM | POA: Diagnosis not present

## 2020-06-17 DIAGNOSIS — J449 Chronic obstructive pulmonary disease, unspecified: Secondary | ICD-10-CM

## 2020-06-17 DIAGNOSIS — M5136 Other intervertebral disc degeneration, lumbar region: Secondary | ICD-10-CM

## 2020-06-17 DIAGNOSIS — Z79899 Other long term (current) drug therapy: Secondary | ICD-10-CM

## 2020-06-17 DIAGNOSIS — G894 Chronic pain syndrome: Secondary | ICD-10-CM

## 2020-06-17 DIAGNOSIS — E538 Deficiency of other specified B group vitamins: Secondary | ICD-10-CM

## 2020-06-17 DIAGNOSIS — I10 Essential (primary) hypertension: Secondary | ICD-10-CM | POA: Diagnosis not present

## 2020-06-17 DIAGNOSIS — E559 Vitamin D deficiency, unspecified: Secondary | ICD-10-CM | POA: Diagnosis not present

## 2020-06-17 DIAGNOSIS — E782 Mixed hyperlipidemia: Secondary | ICD-10-CM

## 2020-06-17 DIAGNOSIS — M1990 Unspecified osteoarthritis, unspecified site: Secondary | ICD-10-CM

## 2020-06-17 MED ORDER — DEXAMETHASONE 4 MG PO TABS: ORAL_TABLET | ORAL | 1 refills | Status: DC

## 2020-06-17 NOTE — Progress Notes (Addendum)
FOLLOW UP 3 MONTHS  Assessment:    Essential hypertension - continue medications, DASH diet, exercise and monitor at home. Call if greater than 130/80.  - add on lasix 40 mg to take as needed -     CBC with Differential/Platelet -     Hepatic function panel -     BASIC METABOLIC PANEL WITH GFR  Hypothyroidism, unspecified type Has been on OTC medication that is not working, has been having  -     TSH  Mixed hyperlipidemia -continue medications, check lipids, decrease fatty foods, increase activity.  -     Lipid panel  Acute right shoulder pain RX Dexamethasone Discussed medication and side effects Discussed referral, imaging? If no improvement.  Chronic obstructive pulmonary disease, unspecified COPD type ([place]) Avoid triggers, symptoms stable at this time  Gastroesophageal reflux disease, esophagitis presence not specified Continue PPI/H2 blocker, diet discussed  Celiac disease Continue GI followup, diet controlled  DDD (degenerative disc disease), lumbar Continue pain management  Arthritis Continue pain management   CKD (chronic kidney disease) stage 2, GFR 60-89 ml/min Increase fluids, avoid NSAIDS, monitor sugars, will monitor -     Hepatic function panel  Abnormal glucose Discussed general issues about diabetes pathophysiology and management., Educational material distributed., Suggested low cholesterol diet., Encouraged aerobic exercise., Discussed foot care., Reminded to get yearly retinal exam. -     Hemoglobin A1c  Vitamin D deficiency Continue supplement  Vitamin B12 deficiency Check B12 next OV  DDD Left-sided low back pain with sciatica, sciatica laterality unspecified, unspecified chronicity Continue pain managament  Chronic pain syndrome Continue pain managament  Medication management -     Magnesium   Over 30 minutes of face to face interview, exam, counseling, chart review and critical decision making was performed  Future  Appointments  Date Time Provider [place]  03/14/2021  3:00 PM Garnet Sierras, NP [facility] None      Subjective:  [name] Fernandez is a 80 y.o. male who presents for Medicare Annual Wellness Visit and follow up   He has been having some left shoulder pain that is a 4 at rest and 8-10 pain with use. He reports he has not tried any home therapies for this.  He follow with pain management for chronic pain of low back. His blood pressure has been controlled at home, today their BP is BP: 124/76  He does not workout due to pain/swelling in his legs. He denies chest pain, shortness of breath, dizziness.  He is not on cholesterol medication, was suppose to be on crestor but stopped it and he is on RYRE. His cholesterol is at goal. The cholesterol last visit was:   Lab Results  Component Value Date   CHOL 160 03/11/2020   HDL 35 (L) 03/11/2020   LDLCALC 100 (H) 03/11/2020   TRIG 152 (H) 03/11/2020   CHOLHDL 4.6 03/11/2020    He has been working on diet and exercise for prediabetes, and denies paresthesia of the feet, polydipsia, polyuria and visual disturbances. Last A1C in the office was:  Lab Results  Component Value Date   HGBA1C 6.0 (H) 03/11/2020   Patient is on Vitamin D supplement.   Lab Results  Component Value Date   VD25OH 35 07/24/2019     BMI is Body mass index is 31.12 kg/m., he is working on diet and exercise. Will have some SOB with exertion, unchanged, no cough or wheezing with it. No CP or other accompaniments with it.  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 Hardin Negus for pain management, on oxycodone 5 mg 1-3 x a day.   He has had venous ulcer left medial leg, following with wound center, putting medihoney and providine. Use compression socks but occ  have to not wear them due to itching/irritation.  He was on zaroxyln at one point but since he has cut back on his oxycodone    Lab Results  Component Value Date   GFRNONAA 80 03/11/2020   He is on OTC thyroid medication complex. His medication was changed last visit. Lab Results  Component Value Date   TSH 2.57 03/11/2020  .  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 He states that the testosterone helps with his energy, libido, muscle mass. Lab Results  Component Value Date   TESTOSTERONE 256 03/11/2020     Medication Review:  Current Outpatient Medications (Endocrine & Metabolic):  Marland Kitchen  Testosterone 10 MG/ACT (2%) GEL, Place 2 ML/mg topically each day  Current Outpatient Medications (Cardiovascular):  .  triamterene-hydrochlorothiazide (MAXZIDE-25) 37.5-25 MG tablet, Take 1 tablet by mouth daily.   Current Outpatient Medications (Analgesics):  .  oxyCODONE (OXY IR/ROXICODONE) 5 MG immediate release tablet, TAKE 1 TABLET EVERY 4 HOURS AS NEEDED  Current Outpatient Medications (Hematological):  Marland Kitchen  Cyanocobalamin (VITAMIN B 12 PO), Take 2,500 mcg by mouth 2 (two) times daily.  Current Outpatient Medications (Other):  Marland Kitchen  Calcium Carbonate-Vit D-Min (CALCIUM 1200 PO), Take by mouth daily. .  Cholecalciferol (VITAMIN D PO), Take 5,000 Int'l Units by mouth daily. .  clotrimazole-betamethasone (LOTRISONE) cream, APPLY TO AFFECTED AREA TWICE A DAY .  hydrOXYzine (ATARAX/VISTARIL) 25 MG tablet, TAKE 1 TABLET (25 MG TOTAL) BY MOUTH 3 (THREE) TIMES DAILY AS NEEDED FOR ITCHING. .  MAGNESIUM PO, Take 500 mg by mouth 2 (two) times daily.  Marland Kitchen  MILK THISTLE PO, Take 240 mg by mouth daily. .  Omega-3 Fatty Acids (OMEGA-3 FISH OIL PO), Take by mouth daily. Marland Kitchen  omeprazole (PRILOSEC) 20 MG capsule, Take 1 capsule (20 mg total) by mouth daily. Marland Kitchen  OVER THE COUNTER MEDICATION, Thyroid complex 2 tabs daily. Marland Kitchen  OVER THE COUNTER MEDICATION, Kelp with Iodine 1  daily .  OVER THE COUNTER MEDICATION, Tumeric Curcumin 500 mg 2 times daily .  OVER THE COUNTER MEDICATION, Chlor oxygen red blood builder 500 mg 2 capsules daily. .  Red Yeast Rice 600 MG CAPS, Take 600 mg by mouth 3 (three) times daily. .  Sennosides (SENOKOT PO), Take by mouth as needed. .  triamcinolone cream (KENALOG) 0.5 %, Apply 1 application topically 2 (two) times daily. .  Wound Dressings (MEDIHONEY WOUND/BURN DRESSING) GEL, APPLY TO WOUND DAILY  Allergies: Allergies  Allergen Reactions  . Feraheme [Ferumoxytol] Other (See Comments)    Swelling of lips and tongue, could not talk 30 minutes after the infusion.  . Naprosyn [Naproxen] Swelling    Unsure if naprosyn caused this reaction  . Latex   . Levothyroxine     Lip swelling  . Doxycycline Nausea Only    Current Problems (verified) Patient Active Problem List   Diagnosis Date Noted  . Anemia 03/13/2019  . Chronic venous insufficiency 03/12/2019  . CKD (chronic kidney disease) stage 2, GFR  60-89 ml/min 12/08/2015  . COPD (chronic obstructive pulmonary disease) ([place]) 12/08/2015  . Chronic pain syndrome 05/26/2015  . DDD (degenerative disc disease), lumbar 05/26/2015  . Medication management 05/26/2015  . 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 Lumbago with sciatica 08/06/2014  . Hyperlipidemia 08/20/2013  . Hypertension   . Abnormal glucose   . Vitamin D deficiency   . Arthritis   . Vitamin B12 deficiency   . Celiac disease 06/19/2012  . GERD (gastroesophageal reflux disease) 04/30/2012  . 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 Hypothyroidism 04/30/2012  . 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 DT (Pediatric) 01/12/2011  . Fluad Quad(high Dose 65+) 02/14/2019  . Influenza, High Dose Seasonal PF 05/20/2014, 05/26/2015, 03/20/2018  . Influenza,inj,Quad PF,6+ Mos 03/20/2018  . Influenza-Unspecified  04/30/2013, 03/29/2016, 03/02/2017  . Pneumococcal Conjugate-13 05/20/2014  . Pneumococcal Polysaccharide-23 03/29/2016  . Pneumococcal-Unspecified 05/20/2008  . Zoster 07/15/2014   Health Maintenance  Topic Date Due  . Hepatitis C Screening  Never done  . COVID-19 Vaccine (1) Never done  . FOOT EXAM  06/16/2017  . OPHTHALMOLOGY EXAM  07/11/2017  . INFLUENZA VACCINE  01/11/2020  . HEMOGLOBIN A1C  09/08/2020  . TETANUS/TDAP  01/11/2021  . URINE MICROALBUMIN  03/11/2021  . PNA vac Low Risk Adult  Completed    Tetanus: 2012  Pneumovax: 2017 Prevnar 13: 2015 Flu vaccine: 2020 Zostavax: 2016 COVID declines  Colonoscopy: 05/2012 Dr. Hilarie Fredrickson  Last one EGD: 05/2012  Names of Other Physician/Practitioners you currently use: 1. [facility] here for primary care 2. Lindalou Hose, eye doctor, last visit 07/2016 Patient Care Team: Unk Pinto, MD as PCP - General (Internal Medicine) Nicholaus Bloom, MD as Consulting Physician (Anesthesiology) Pyrtle, Lajuan Lines, MD as Consulting Physician (Gastroenterology)    Review of Systems  Constitutional: Negative for chills, diaphoresis, fever, malaise/fatigue and weight loss.  HENT: Negative for congestion, ear discharge, ear pain, hearing loss, nosebleeds, sore throat and tinnitus.   Eyes: Negative.   Respiratory: Negative for cough, hemoptysis, sputum production, shortness of breath, wheezing and stridor.   Cardiovascular: Negative for chest pain, palpitations, orthopnea, claudication, leg swelling and PND.  Gastrointestinal: Negative for abdominal pain, blood in stool, constipation, diarrhea, heartburn, melena, nausea and vomiting.  Genitourinary: Negative for dysuria, flank pain, frequency, hematuria and urgency.  Musculoskeletal: Positive for back pain, joint pain and myalgias. Negative for falls and neck pain.  Skin: Negative for itching and rash.  Neurological: Negative for dizziness, tingling,  tremors, sensory change, speech change, focal weakness, seizures, loss of consciousness and headaches.  Endo/Heme/Allergies: Negative.   Psychiatric/Behavioral: Negative.      Objective:     Today's Vitals   06/17/20 1556  BP: 124/76  Pulse: 71  Temp: 97.9 F (36.6 C)  SpO2: 97%  Weight: 187 lb (84.8 kg)  Height: 5' 5"  (1.651 m)   Body mass index is 31.12 kg/m.  General appearance: alert, no distress, WD/WN, male HEENT: normocephalic, sclerae anicteric, TMs pearly, nares patent, no discharge or erythema, pharynx normal Oral cavity: MMM, no lesions Neck: supple, no lymphadenopathy, no thyromegaly, no masses Heart: RRR, normal S1, S2, no murmurs Lungs: CTA bilaterally, no wheezes, rhonchi, or rales Abdomen: +bs, soft, non tender, non distended, no masses, no hepatomegaly, no splenomegaly Musculoskeletal: nontender, no swelling, no obvious deformity. Left should decreased ROM, crepitus noted, no point tenderness. Extremities: 2+ edema, left lower medial leg with erythema, no visible  ulcer or break down, no cyanosis, no clubbing Pulses: 2+ symmetric, upper and lower extremities, normal cap refill Neurological: alert, oriented x 3, CN2-12 intact, strength normal upper extremities and lower extremities,  DTRs 2+ throughout, no cerebellar signs, gait Normal and Antalgic Psychiatric: normal affect, behavior normal, pleasant   Medicare Attestation I have personally reviewed: The patient's medical and social history Their use of alcohol, tobacco or illicit drugs Their current medications and supplements The patient's functional ability including ADLs,fall risks, home safety risks, cognitive, and hearing and visual impairment Diet and physical activities Evidence for depression or mood disorders  The patient's weight, height, BMI, and visual acuity have been recorded in the chart.  I have made referrals, counseling, and provided education to the patient based on review of the above  and I have provided the patient with a written personalized care plan for preventive services.     Garnet Sierras, NP   06/17/2020

## 2020-06-18 LAB — CBC WITH DIFFERENTIAL/PLATELET
Absolute Monocytes: 703 cells/uL (ref 200–950)
Basophils Absolute: 47 cells/uL (ref 0–200)
Basophils Relative: 0.6 %
Eosinophils Absolute: 284 cells/uL (ref 15–500)
Eosinophils Relative: 3.6 %
HCT: 37.8 % — ABNORMAL LOW (ref 38.5–50.0)
Hemoglobin: 12.9 g/dL — ABNORMAL LOW (ref 13.2–17.1)
Lymphs Abs: 2062 cells/uL (ref 850–3900)
MCH: 31.6 pg (ref 27.0–33.0)
MCHC: 34.1 g/dL (ref 32.0–36.0)
MCV: 92.6 fL (ref 80.0–100.0)
MPV: 10.2 fL (ref 7.5–12.5)
Monocytes Relative: 8.9 %
Neutro Abs: 4803 cells/uL (ref 1500–7800)
Neutrophils Relative %: 60.8 %
Platelets: 373 10*3/uL (ref 140–400)
RBC: 4.08 10*6/uL — ABNORMAL LOW (ref 4.20–5.80)
RDW: 14.1 % (ref 11.0–15.0)
Total Lymphocyte: 26.1 %
WBC: 7.9 10*3/uL (ref 3.8–10.8)

## 2020-06-18 LAB — COMPLETE METABOLIC PANEL WITH GFR
AG Ratio: 1.4 (calc) (ref 1.0–2.5)
ALT: 22 U/L (ref 9–46)
AST: 26 U/L (ref 10–35)
Albumin: 4.2 g/dL (ref 3.6–5.1)
Alkaline phosphatase (APISO): 61 U/L (ref 35–144)
BUN: 15 mg/dL (ref 7–25)
CO2: 31 mmol/L (ref 20–32)
Calcium: 9.7 mg/dL (ref 8.6–10.3)
Chloride: 99 mmol/L (ref 98–110)
Creat: 1.09 mg/dL (ref 0.70–1.18)
GFR, Est African American: 74 mL/min/{1.73_m2} (ref >=60)
GFR, Est Non African American: 64 mL/min/{1.73_m2} (ref >=60)
Globulin: 2.9 g/dL (calc) (ref 1.9–3.7)
Glucose, Bld: 106 mg/dL — ABNORMAL HIGH (ref 65–99)
Potassium: 5.3 mmol/L (ref 3.5–5.3)
Sodium: 136 mmol/L (ref 135–146)
Total Bilirubin: 1.1 mg/dL (ref 0.2–1.2)
Total Protein: 7.1 g/dL (ref 6.1–8.1)

## 2020-06-18 LAB — TSH: TSH: 3.62 mIU/L (ref 0.40–4.50)

## 2020-06-18 LAB — LIPID PANEL
Cholesterol: 161 mg/dL (ref <200)
HDL: 29 mg/dL — ABNORMAL LOW (ref >=40)
LDL Cholesterol (Calc): 108 mg/dL (calc) — ABNORMAL HIGH
Non-HDL Cholesterol (Calc): 132 mg/dL (calc) — ABNORMAL HIGH (ref <130)
Total CHOL/HDL Ratio: 5.6 (calc) — ABNORMAL HIGH (ref <5.0)
Triglycerides: 128 mg/dL (ref <150)

## 2020-08-11 ENCOUNTER — Other Ambulatory Visit: Payer: Self-pay | Admitting: *Deleted

## 2020-08-11 DIAGNOSIS — R21 Rash and other nonspecific skin eruption: Secondary | ICD-10-CM

## 2020-08-11 MED ORDER — TRIAMCINOLONE ACETONIDE 0.5 % EX CREA: 1.0000 "application " | TOPICAL_CREAM | Freq: Two times a day (BID) | CUTANEOUS | 2 refills | Status: DC

## 2020-08-20 DIAGNOSIS — M25512 Pain in left shoulder: Secondary | ICD-10-CM | POA: Diagnosis not present

## 2020-09-14 DIAGNOSIS — H6983 Other specified disorders of Eustachian tube, bilateral: Secondary | ICD-10-CM | POA: Diagnosis not present

## 2020-09-14 DIAGNOSIS — H9193 Unspecified hearing loss, bilateral: Secondary | ICD-10-CM | POA: Diagnosis not present

## 2020-09-14 DIAGNOSIS — H9313 Tinnitus, bilateral: Secondary | ICD-10-CM | POA: Diagnosis not present

## 2020-09-14 DIAGNOSIS — J302 Other seasonal allergic rhinitis: Secondary | ICD-10-CM | POA: Diagnosis not present

## 2020-09-28 ENCOUNTER — Encounter: Payer: Self-pay | Admitting: Internal Medicine

## 2020-09-28 ENCOUNTER — Other Ambulatory Visit: Payer: Self-pay

## 2020-09-28 ENCOUNTER — Ambulatory Visit (INDEPENDENT_AMBULATORY_CARE_PROVIDER_SITE_OTHER): Payer: Medicare Other | Admitting: Internal Medicine

## 2020-09-28 VITALS — BP 130/62 | HR 85 | Temp 97.7°F | Resp 16 | Ht 66.3 in | Wt 189.6 lb

## 2020-09-28 DIAGNOSIS — K219 Gastro-esophageal reflux disease without esophagitis: Secondary | ICD-10-CM | POA: Diagnosis not present

## 2020-09-28 DIAGNOSIS — E785 Hyperlipidemia, unspecified: Secondary | ICD-10-CM

## 2020-09-28 DIAGNOSIS — E349 Endocrine disorder, unspecified: Secondary | ICD-10-CM | POA: Diagnosis not present

## 2020-09-28 DIAGNOSIS — Z79899 Other long term (current) drug therapy: Secondary | ICD-10-CM

## 2020-09-28 DIAGNOSIS — Z125 Encounter for screening for malignant neoplasm of prostate: Secondary | ICD-10-CM | POA: Diagnosis not present

## 2020-09-28 DIAGNOSIS — E538 Deficiency of other specified B group vitamins: Secondary | ICD-10-CM

## 2020-09-28 DIAGNOSIS — E782 Mixed hyperlipidemia: Secondary | ICD-10-CM | POA: Diagnosis not present

## 2020-09-28 DIAGNOSIS — R7309 Other abnormal glucose: Secondary | ICD-10-CM | POA: Diagnosis not present

## 2020-09-28 DIAGNOSIS — I10 Essential (primary) hypertension: Secondary | ICD-10-CM | POA: Diagnosis not present

## 2020-09-28 DIAGNOSIS — E559 Vitamin D deficiency, unspecified: Secondary | ICD-10-CM

## 2020-09-28 DIAGNOSIS — N182 Chronic kidney disease, stage 2 (mild): Secondary | ICD-10-CM | POA: Diagnosis not present

## 2020-09-28 DIAGNOSIS — E1122 Type 2 diabetes mellitus with diabetic chronic kidney disease: Secondary | ICD-10-CM

## 2020-09-28 DIAGNOSIS — N138 Other obstructive and reflux uropathy: Secondary | ICD-10-CM | POA: Diagnosis not present

## 2020-09-28 DIAGNOSIS — M5136 Other intervertebral disc degeneration, lumbar region: Secondary | ICD-10-CM

## 2020-09-28 DIAGNOSIS — G894 Chronic pain syndrome: Secondary | ICD-10-CM

## 2020-09-28 DIAGNOSIS — E1169 Type 2 diabetes mellitus with other specified complication: Secondary | ICD-10-CM

## 2020-09-28 DIAGNOSIS — N401 Enlarged prostate with lower urinary tract symptoms: Secondary | ICD-10-CM | POA: Diagnosis not present

## 2020-09-28 NOTE — Patient Instructions (Signed)

## 2020-09-28 NOTE — Progress Notes (Signed)
/        History of Present Illness:       This very nice 80 y.o. MWM presents for 6 month follow up with HTN, HLD, Pre-Diabetes and Vitamin D Deficiency.    Patient has been on chronic opioids by Dr Nicholaus Bloom for a chronic pain syndrome due to Lumbar DDD w/ Lt sciatica &  Thoracic Compression fx's. He has tapered his Oxycodone down to BID and is amenable to trying Gabapentin as he would like to get off the Opioids completely.        Patient is treated for HTN  (2000) & BP has been controlled at home. Today's BP is at goal -  130/62. Patient has had no complaints of any cardiac type chest pain, palpitations, dyspnea Vertell Limber /PND, dizziness, claudication, or dependent edema.       Hyperlipidemia is controlled with diet & meds. Patient denies myalgias or other med SE's. Last Lipids were not at goal:  Lab Results  Component Value Date   CHOL 161 06/17/2020   HDL 29 (L) 06/17/2020   LDLCALC 108 (H) 06/17/2020   TRIG 128 06/17/2020   CHOLHDL 5.6 (H) 06/17/2020     Also, the patient has history of T2_NIDDM  (A1c 6.5%/2015) w/CKD2 (GFR 64) and has been attempting control with diet. He has had no symptoms of reactive hypoglycemia, diabetic polys, paresthesias or visual blurring.  Last A1c was not at goal:      Lab Results  Component Value Date   HGBA1C 6.0 (H) 03/11/2020           Patient has been on OTC Thyroid replacement since 2011. He also is on Testosterone replacement.        Further, the patient also has history of Vitamin D Deficiency  ("26"/2009)  and supplements vitamin D without any suspected side-effects. Last vitamin D was still low:  Lab Results  Component Value Date   VD25OH 35 07/24/2019    Current Outpatient Medications on File Prior to Visit  Medication Sig  . CALCIUM 1200 P Take daily.  Marland Kitchen VITAMIN D 5,000 Units  Take  daily.  . clotrimazole-betamethasone  cream APPLY TO AFFECTED AREA TWICE A DAY  . \VITAMIN B 12\ Take\ 2 (\times daily.  . hydrOXYzine  \ 25 MG tablet 2,500 mcg  TAKE 1 TABLET  3  TIMES DAILY AS NEEDED   . MAGNESIUM PO500 mg Take  2  times daily.   Marland Kitchen MILK THISTLE PO 240 mg  Take daily.  . Omega-3 \FISH OIL \ Take daily.  Marland Kitchen omeprazole \20 MG capsule Take 1 capsule \daily.  . Thyroid complex  2 tabs daily.  Marland Kitchen Kelp with Iodine  1 daily  . Tumeric Curcumin  500 mg 2 times daily  . Chlor oxygen red blood builder 500 mg  2 capsules daily.  Marland Kitchen oxyCODONE   5 MG immediate release tablet TAKE 1 TABLET EVERY 4 HRS AS NEEDED  . Red Yeast Rice 600 MG CAPS Take  3  times daily.  Donavan Burnet Take as needed.  . Testosterone 10 MG/ACT (2%) GEL Place 2 ML/mg topically each day  . triamcinolone cream 0.5 % Apply 1 application topically 2 (two) times daily.  Marland Kitchen triamterene-hctz 37.5-25 MG tablet Take 1 tablet  daily.  . Wound Dressings (MEDIHONEY WOUND/BURN DRESSING) GEL APPLY TO WOUND DAILY     Allergies  Allergen Reactions  . Feraheme [Ferumoxytol] Other (See Comments)    Swelling of lips and tongue, could  not talk 30 minutes after the infusion.  . Naprosyn [Naproxen] Swelling    Unsure if naprosyn caused this reaction  . Latex   . Levothyroxine     Lip swelling  . Doxycycline Nausea Only    PMHx:   Past Medical History:  Diagnosis Date  . Anemia   . Arthritis   . Celiac disease    diagnoses 06/24/12  . Esophageal stricture   . H/O hiatal hernia   . Hiatal hernia   . Hypertension   . Hypothyroidism   . Prediabetes   . Shortness of breath    from oxycodone at times  . Vitamin B12 deficiency   . Vitamin D deficiency     Immunization History  Administered Date(s) Administered  . DT (Pediatric) 01/12/2011  . Fluad Quad(high Dose 65+) 02/14/2019  . Influenza, High Dose Seasonal PF 05/20/2014, 05/26/2015, 03/20/2018  . Influenza,inj,Quad PF,6+ Mos 03/20/2018  . Influenza-Unspecified 04/30/2013, 03/29/2016, 03/02/2017  . Pneumococcal Conjugate-13 05/20/2014  . Pneumococcal Polysaccharide-23 03/29/2016  .  Pneumococcal-Unspecified 05/20/2008  . Zoster 07/15/2014    Past Surgical History:  Procedure Laterality Date  . BACK SURGERY     3 diff surgeries for vertebrea broken  . EYE SURGERY     bilateral cataract surgery  . HERNIA REPAIR  1950's   bilateral groin as child  . HIATAL HERNIA REPAIR    . JOINT REPLACEMENT  04/2010   right knee replacement  . TONSILLECTOMY     as child  . TOTAL KNEE REVISION  11/20/2011   Procedure: TOTAL KNEE REVISION;  Surgeon: Mauri Pole, MD;  Location: [facility];  Service: Orthopedics;  Laterality: Right;  Right Total Knee Revision    FHx:    Reviewed / unchanged  SHx:    Reviewed / unchanged   Systems Review:  Constitutional: Denies fever, chills, wt changes, headaches, insomnia, fatigue, night sweats, change in appetite. Eyes: Denies redness, blurred vision, diplopia, discharge, itchy, watery eyes.  ENT: Denies discharge, congestion, post nasal drip, epistaxis, sore throat, earache, hearing loss, dental pain, tinnitus, vertigo, sinus pain, snoring.  CV: Denies chest pain, palpitations, irregular heartbeat, syncope, dyspnea, diaphoresis, orthopnea, PND, claudication or edema. Respiratory: denies cough, dyspnea, DOE, pleurisy, hoarseness, laryngitis, wheezing.  Gastrointestinal: Denies dysphagia, odynophagia, heartburn, reflux, water brash, abdominal pain or cramps, nausea, vomiting, bloating, diarrhea, constipation, hematemesis, melena, hematochezia  or hemorrhoids. Genitourinary: Denies dysuria, frequency, urgency, nocturia, hesitancy, discharge, hematuria or flank pain. Musculoskeletal: Denies arthralgias, myalgias, stiffness, jt. swelling, pain, limping or strain/sprain.  Skin: Denies pruritus, rash, hives, warts, acne, eczema or change in skin lesion(s). Neuro: No weakness, tremor, incoordination, spasms, paresthesia or pain. Psychiatric: Denies confusion, memory loss or sensory loss. Endo: Denies change in weight, skin or hair change.   Heme/Lymph: No excessive bleeding, bruising or enlarged lymph nodes.  Physical Exam  BP 130/62   Pulse 85   Temp 97.7 F (36.5 C) (Temporal)   Resp 16   Ht 5' 6.3" (1.684 m)   Wt 189 lb 9.6 oz (86 kg)   SpO2 99%   BMI 30.33 kg/m   Appears  well nourished, well groomed  and in no distress.  Eyes: PERRLA, EOMs, conjunctiva no swelling or erythema. Sinuses: No frontal/maxillary tenderness ENT/Mouth: EAC's clear, TM's nl w/o erythema, bulging. Nares clear w/o erythema, swelling, exudates. Oropharynx clear without erythema or exudates. Oral hygiene is good. Tongue normal, non obstructing. Hearing intact.  Neck: Supple. Thyroid not palpable. Car 2+/2+ without bruits, nodes or JVD. Chest: Respirations nl  with BS clear & equal w/o rales, rhonchi, wheezing or stridor.  Cor: Heart sounds normal w/ regular rate and rhythm without sig. murmurs, gallops, clicks or rubs. Peripheral pulses normal and equal  without edema.  Abdomen: Soft & bowel sounds normal. Non-tender w/o guarding, rebound, hernias, masses or organomegaly.  Lymphatics: Unremarkable.  Musculoskeletal: Full ROM all peripheral extremities, joint stability, 5/5 strength and normal gait.  Skin: Warm, dry without exposed rashes, lesions or ecchymosis apparent.  Neuro: Cranial nerves intact, reflexes equal bilaterally. Sensory-motor testing grossly intact. Tendon reflexes grossly intact.  Pysch: Alert & oriented x 3.  Insight and judgement nl & appropriate. No ideations.  Assessment and Plan:  1. Essential hypertension  - Continue medication, monitor blood pressure at home.  - Continue DASH diet.  Reminder to go to the ER if any CP,  SOB, nausea, dizziness, severe HA, changes vision/speech.  - CBC with Differential/Platelet - COMPLETE METABOLIC PANEL WITH GFR - Magnesium - TSH  2. Hyperlipidemia associated with type 2 diabetes mellitus ([place])  - Continue diet/meds, exercise,& lifestyle modifications.  - Continue monitor  periodic cholesterol/liver & renal functions   - Lipid panel - TSH  3. Type 2 diabetes mellitus with stage 2 chronic kidney  disease, without long-term current use of insulin ([facility])  - Continue diet, exercise  - Lifestyle modifications.  - Monitor appropriate labs  - Hemoglobin A1c - Insulin, random  4. Vitamin D deficiency  - Continue supplementation.  - VITAMIN D 25 Hydroxy   5. Vitamin B12 deficiency  - Vitamin B12 - CBC with Differential/Platelet  6. Gastroesophageal reflux disease - CBC with Differential/Platelet  7. Chronic pain syndrome  - Try  Rx Gabapentin 600 mg - Take 1/2 -1 tab tid   8. DDD (degenerative disc disease), lumbar   9. Testosterone deficiency  - Testosterone  10. Medication management  - Vitamin B12 - Testosterone - CBC with Differential/Platelet - COMPLETE METABOLIC PANEL WITH GFR - Magnesium - Lipid panel - TSH - Hemoglobin A1c - Insulin, random - VITAMIN D 25 Hydroxyl          Discussed  regular exercise, BP monitoring, weight control to achieve/maintain BMI less than 25 and discussed med and SE's. Recommended labs to assess and monitor clinical status with further disposition pending results of labs.  I discussed the assessment and treatment plan with the patient. The patient was provided an opportunity to ask questions and all were answered. The patient agreed with the plan and demonstrated an understanding of the instructions.  I provided over 30 minutes of exam, counseling, chart review and  complex critical decision making.         The patient was advised to call back or seek an in-person evaluation if the symptoms worsen or if the condition fails to improve as anticipated.   [name], MD

## 2020-09-29 ENCOUNTER — Other Ambulatory Visit: Payer: Self-pay | Admitting: Internal Medicine

## 2020-09-29 LAB — COMPLETE METABOLIC PANEL WITH GFR
AG Ratio: 1.3 (calc) (ref 1.0–2.5)
ALT: 20 U/L (ref 9–46)
AST: 24 U/L (ref 10–35)
Albumin: 4.3 g/dL (ref 3.6–5.1)
Alkaline phosphatase (APISO): 76 U/L (ref 35–144)
BUN: 13 mg/dL (ref 7–25)
CO2: 27 mmol/L (ref 20–32)
Calcium: 9.9 mg/dL (ref 8.6–10.3)
Chloride: 100 mmol/L (ref 98–110)
Creat: 1.08 mg/dL (ref 0.70–1.18)
GFR, Est African American: 75 mL/min/{1.73_m2} (ref >=60)
GFR, Est Non African American: 65 mL/min/{1.73_m2} (ref >=60)
Globulin: 3.2 g/dL (calc) (ref 1.9–3.7)
Glucose, Bld: 129 mg/dL — ABNORMAL HIGH (ref 65–99)
Potassium: 4.6 mmol/L (ref 3.5–5.3)
Sodium: 137 mmol/L (ref 135–146)
Total Bilirubin: 1 mg/dL (ref 0.2–1.2)
Total Protein: 7.5 g/dL (ref 6.1–8.1)

## 2020-09-29 LAB — CBC WITH DIFFERENTIAL/PLATELET
Absolute Monocytes: 687 cells/uL (ref 200–950)
Basophils Absolute: 52 cells/uL (ref 0–200)
Basophils Relative: 0.6 %
Eosinophils Absolute: 131 cells/uL (ref 15–500)
Eosinophils Relative: 1.5 %
HCT: 40.6 % (ref 38.5–50.0)
Hemoglobin: 13.5 g/dL (ref 13.2–17.1)
Lymphs Abs: 2271 cells/uL (ref 850–3900)
MCH: 30.8 pg (ref 27.0–33.0)
MCHC: 33.3 g/dL (ref 32.0–36.0)
MCV: 92.5 fL (ref 80.0–100.0)
MPV: 10.7 fL (ref 7.5–12.5)
Monocytes Relative: 7.9 %
Neutro Abs: 5559 cells/uL (ref 1500–7800)
Neutrophils Relative %: 63.9 %
Platelets: 379 10*3/uL (ref 140–400)
RBC: 4.39 10*6/uL (ref 4.20–5.80)
RDW: 14.3 % (ref 11.0–15.0)
Total Lymphocyte: 26.1 %
WBC: 8.7 10*3/uL (ref 3.8–10.8)

## 2020-09-29 LAB — LIPID PANEL
Cholesterol: 197 mg/dL (ref <200)
HDL: 33 mg/dL — ABNORMAL LOW (ref >=40)
LDL Cholesterol (Calc): 133 mg/dL (calc) — ABNORMAL HIGH
Non-HDL Cholesterol (Calc): 164 mg/dL (calc) — ABNORMAL HIGH (ref <130)
Total CHOL/HDL Ratio: 6 (calc) — ABNORMAL HIGH (ref <5.0)
Triglycerides: 179 mg/dL — ABNORMAL HIGH (ref <150)

## 2020-09-29 LAB — TSH: TSH: 3.08 mIU/L (ref 0.40–4.50)

## 2020-09-29 LAB — INSULIN, RANDOM: Insulin: 43.4 u[IU]/mL — ABNORMAL HIGH

## 2020-09-29 LAB — TESTOSTERONE: Testosterone: 193 ng/dL — ABNORMAL LOW (ref 250–827)

## 2020-09-29 LAB — VITAMIN D 25 HYDROXY (VIT D DEFICIENCY, FRACTURES): Vit D, 25-Hydroxy: 44 ng/mL (ref 30–100)

## 2020-09-29 LAB — HEMOGLOBIN A1C
Hgb A1c MFr Bld: 6 % of total Hgb — ABNORMAL HIGH (ref <5.7)
Mean Plasma Glucose: 126 mg/dL
eAG (mmol/L): 7 mmol/L

## 2020-09-29 LAB — VITAMIN B12: Vitamin B-12: 453 pg/mL (ref 200–1100)

## 2020-09-29 LAB — MAGNESIUM: Magnesium: 1.8 mg/dL (ref 1.5–2.5)

## 2020-09-29 MED ORDER — GABAPENTIN 600 MG PO TABS: ORAL_TABLET | ORAL | 1 refills | Status: DC

## 2020-09-29 NOTE — Progress Notes (Signed)
============================================================ - Test results slightly outside the reference range are not unusual. If there is anything important, I will review this with you,  otherwise it is considered normal test values.  If you have further questions,  please do not hesitate to contact me at the office or via My Chart.  ============================================================ ============================================================  -  Vitamin B12 =   453    Very Low End of Normal  (Ideal or Goal Vit B12 is between 450 - 1,100)   - So suggest that you restart your Vitamin B12 tabs 2,500 mcg 2 x /week    Low Vit B12 may be associated with Anemia , Fatigue,   Peripheral Neuropathy, Dementia, "Brain Fog", & Depression  - Recommend take a sub-lingual form of Vitamin B12 tablet   1,000 to 5,000 mcg tab that you dissolve under your tongue /Daily   - Can get Baron Sane - best price at [employer] or on [employer] ============================================================ ============================================================  -  Testosterone = 193 is very low       (goal or Ideal is between 450-850)   - Recommend take Zinc 50 mg tablet Daily                                                          to Help raise Testosterone level Naturally   (Probably would  get much better levels of testosterone by taking                     a 1 ml or 1 cc  shot every week of Depo-Testosterone   - would probably cost about $2.50  /week from your [facility] &                                                Nurse can teach you how to give your own shot.   ============================================================ ============================================================  -  Total Chol = 193 - slightly elevated       ( Ideal or Goal is less than 180 )   - But the Bad /Dangerous LDL Chol = 133 - very bad       (Ideal or Goal is less than 70  !  )   - Recommend throw  away the Red yeast Rice  &  I'll send in a Rx for   Rosuvastatin  (Crestor) to keep you from having a Hear Attack , Stroke or                                                                          developing Vascular Dementia .  - & Please schedule an Office Visit in about 3 months to Recheck Cholesterol ============================================================ ============================================================  -  A1c = 6.0% - still Blood sugar and A1c are elevated in the borderline and  early or pre-diabetes range which has the same   300% increased risk for heart attack, stroke,  cancer and   Alzheimer- type Vascular Dementia as full blown  Diabetes.   But the good news is that diet, exercise with  weight loss can cure the early diabetes at this point.  - So . . . Marland Kitchen   - Avoid Sweets, Candy & White Stuff   - White Rice, White [place], White Flour  - Breads &  Pasta ============================================================ ============================================================  -  Vitamin D = 44 - Very Low   - Vitamin D goal is between 70-100  !   - Please INCREASE your Vitamin D 5,000 unit capsule to                                                              2 capsules = 10,000 units EVERY DAY    - It is very important as a natural anti-inflammatory and helping the  immune system protect against viral infections, like the Covid-19    helping hair, skin, and nails, as well as reducing stroke and  heart attack risk.   - It helps your bones and helps with mood.  - It also decreases numerous cancer risks so please  take it as directed.   - Low Vit D is associated with a 200-300% higher risk for  CANCER   and 200-300% higher risk for HEART   ATTACK  &  STROKE.    - It is also associated with higher death rate at younger ages,   autoimmune diseases like Rheumatoid arthritis, Lupus,  Multiple Sclerosis.     - Also many other serious  conditions, like depression, Alzheimer's  Dementia, infertility, muscle aches, fatigue, fibromyalgia   - just to name a few. ============================================================ ============================================================  -  All Else - CBC - Kidneys - Electrolytes - Liver - Magnesium & Thyroid    - all  Normal / OK ============================================================ ============================================================

## 2020-09-29 NOTE — Addendum Note (Signed)
Addended by: Unk Pinto on: 09/29/2020 10:34 AM   Modules accepted: Orders

## 2020-10-20 DIAGNOSIS — M40204 Unspecified kyphosis, thoracic region: Secondary | ICD-10-CM | POA: Diagnosis not present

## 2020-10-20 DIAGNOSIS — Z79891 Long term (current) use of opiate analgesic: Secondary | ICD-10-CM | POA: Diagnosis not present

## 2020-10-20 DIAGNOSIS — M546 Pain in thoracic spine: Secondary | ICD-10-CM | POA: Diagnosis not present

## 2020-10-20 DIAGNOSIS — G894 Chronic pain syndrome: Secondary | ICD-10-CM | POA: Diagnosis not present

## 2020-12-10 ENCOUNTER — Other Ambulatory Visit: Payer: Self-pay | Admitting: Internal Medicine

## 2020-12-20 ENCOUNTER — Other Ambulatory Visit: Payer: Self-pay | Admitting: Internal Medicine

## 2020-12-20 MED ORDER — AZITHROMYCIN 250 MG PO TABS: ORAL_TABLET | ORAL | 1 refills | Status: DC

## 2020-12-20 MED ORDER — BENZONATATE 200 MG PO CAPS: ORAL_CAPSULE | ORAL | 1 refills | Status: DC

## 2021-02-23 ENCOUNTER — Other Ambulatory Visit: Payer: Self-pay | Admitting: Internal Medicine

## 2021-02-23 DIAGNOSIS — E349 Endocrine disorder, unspecified: Secondary | ICD-10-CM

## 2021-02-23 MED ORDER — TESTOSTERONE 10 MG/ACT (2%) TD GEL: TRANSDERMAL | 1 refills | Status: DC

## 2021-02-24 ENCOUNTER — Other Ambulatory Visit: Payer: Self-pay | Admitting: Internal Medicine

## 2021-02-24 DIAGNOSIS — E349 Endocrine disorder, unspecified: Secondary | ICD-10-CM

## 2021-02-24 MED ORDER — TESTOSTERONE 10 MG/ACT (2%) TD GEL: TRANSDERMAL | 1 refills | Status: DC

## 2021-02-24 MED ORDER — TESTOSTERONE 50 MG/5GM (1%) TD GEL: 5.0000 g | Freq: Every day | TRANSDERMAL | 1 refills | Status: DC

## 2021-02-25 ENCOUNTER — Other Ambulatory Visit: Payer: Self-pay | Admitting: Internal Medicine

## 2021-03-01 ENCOUNTER — Telehealth: Payer: Self-pay

## 2021-03-01 NOTE — Telephone Encounter (Signed)
Prior auth for Testosterone 50 MG/5GM approved through 03/01/22

## 2021-03-14 ENCOUNTER — Encounter: Payer: Medicare Other | Admitting: Adult Health Nurse Practitioner

## 2021-03-16 ENCOUNTER — Ambulatory Visit (INDEPENDENT_AMBULATORY_CARE_PROVIDER_SITE_OTHER): Payer: Medicare Other | Admitting: Internal Medicine

## 2021-03-16 ENCOUNTER — Other Ambulatory Visit: Payer: Self-pay

## 2021-03-16 VITALS — BP 140/80 | HR 56 | Temp 97.8°F | Resp 16 | Ht 66.3 in | Wt 186.0 lb

## 2021-03-16 DIAGNOSIS — Z8249 Family history of ischemic heart disease and other diseases of the circulatory system: Secondary | ICD-10-CM | POA: Diagnosis not present

## 2021-03-16 DIAGNOSIS — E1169 Type 2 diabetes mellitus with other specified complication: Secondary | ICD-10-CM

## 2021-03-16 DIAGNOSIS — I872 Venous insufficiency (chronic) (peripheral): Secondary | ICD-10-CM

## 2021-03-16 DIAGNOSIS — Z125 Encounter for screening for malignant neoplasm of prostate: Secondary | ICD-10-CM | POA: Diagnosis not present

## 2021-03-16 DIAGNOSIS — Z87891 Personal history of nicotine dependence: Secondary | ICD-10-CM

## 2021-03-16 DIAGNOSIS — N182 Chronic kidney disease, stage 2 (mild): Secondary | ICD-10-CM

## 2021-03-16 DIAGNOSIS — Z23 Encounter for immunization: Secondary | ICD-10-CM | POA: Diagnosis not present

## 2021-03-16 DIAGNOSIS — K219 Gastro-esophageal reflux disease without esophagitis: Secondary | ICD-10-CM

## 2021-03-16 DIAGNOSIS — Z79899 Other long term (current) drug therapy: Secondary | ICD-10-CM | POA: Diagnosis not present

## 2021-03-16 DIAGNOSIS — E1122 Type 2 diabetes mellitus with diabetic chronic kidney disease: Secondary | ICD-10-CM

## 2021-03-16 DIAGNOSIS — R7309 Other abnormal glucose: Secondary | ICD-10-CM

## 2021-03-16 DIAGNOSIS — Z1211 Encounter for screening for malignant neoplasm of colon: Secondary | ICD-10-CM

## 2021-03-16 DIAGNOSIS — E039 Hypothyroidism, unspecified: Secondary | ICD-10-CM | POA: Diagnosis not present

## 2021-03-16 DIAGNOSIS — Z136 Encounter for screening for cardiovascular disorders: Secondary | ICD-10-CM | POA: Diagnosis not present

## 2021-03-16 DIAGNOSIS — I10 Essential (primary) hypertension: Secondary | ICD-10-CM | POA: Diagnosis not present

## 2021-03-16 DIAGNOSIS — E785 Hyperlipidemia, unspecified: Secondary | ICD-10-CM | POA: Diagnosis not present

## 2021-03-16 DIAGNOSIS — N138 Other obstructive and reflux uropathy: Secondary | ICD-10-CM

## 2021-03-16 DIAGNOSIS — N401 Enlarged prostate with lower urinary tract symptoms: Secondary | ICD-10-CM | POA: Diagnosis not present

## 2021-03-16 DIAGNOSIS — M5136 Other intervertebral disc degeneration, lumbar region: Secondary | ICD-10-CM

## 2021-03-16 DIAGNOSIS — J449 Chronic obstructive pulmonary disease, unspecified: Secondary | ICD-10-CM

## 2021-03-16 DIAGNOSIS — G894 Chronic pain syndrome: Secondary | ICD-10-CM

## 2021-03-16 NOTE — Patient Instructions (Signed)

## 2021-03-16 NOTE — Progress Notes (Signed)
Comprehensive Evaluation & Examination  Future Appointments  Date Time Provider [place]  03/16/2021  4:00 PM Unk Pinto, MD [facility] None            This very nice 80 y.o. MWM presents for a comprehensive evaluation and management of multiple medical co-morbidities.  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         HTN predates circa 2000. Patient's BP has been controlled at home.  Today's BP is at goal -  140/80. Patient denies any cardiac symptoms as chest pain, palpitations, shortness of breath, dizziness or ankle swelling.       Patient's hyperlipidemia is  not controlled with diet and he is reticent to take meds for Cholesterol. Last lipids were not at goal:  Lab Results  Component Value Date   CHOL 197 09/28/2020   HDL 33 (L) 09/28/2020   LDLCALC 133 (H) 09/28/2020   TRIG 179 (H) 09/28/2020   CHOLHDL 6.0 (H) 09/28/2020         Patient has diet controlled T2_NIDDM  (A1c 6.5%/2015) and patient denies reactive hypoglycemic symptoms, visual blurring, diabetic polys or paresthesias. Last A1c was not at goal:   Lab Results  Component Value Date   HGBA1C 6.0 (H) 09/28/2020          Finally, patient has history of Vitamin D Deficiency  ("26"/2009) and last vitamin D was low & he has stopped his supplements:   Lab Results  Component Value Date   VD25OH 44 09/28/2020     Current Outpatient Medications on File Prior to Visit  Medication Sig   Red Yeast Rice 600 MG CAPS PRN   Sennosides (SENOKOT PO) Take as needed.   triamterene-hctz (MAXZIDE-25) 37.5-25  Take 1 tablet daily.      Allergies  Allergen Reactions   Feraheme [Ferumoxytol] Other (See Comments)    Swelling of  lips and tongue, could not talk 30 minutes after the infusion.   Naprosyn [Naproxen] Swelling    Unsure if naprosyn caused this reaction   Latex    Levothyroxine     Lip swelling   Doxycycline Nausea Only     Past Medical History:  Diagnosis Date   Anemia    Arthritis    Celiac disease    diagnoses 06/24/12   Esophageal stricture    H/O hiatal hernia    Hiatal hernia    Hypertension    Hypothyroidism    Prediabetes    Shortness of breath    from oxycodone at times   Vitamin B12 deficiency    Vitamin D deficiency      Health Maintenance  Topic Date Due   COVID-19 Vaccine (1) Never done   Zoster Vaccines- Shingrix (1 of 2) Never done   FOOT EXAM  06/16/2017   OPHTHALMOLOGY EXAM  07/11/2017   INFLUENZA VACCINE  01/10/2021   TETANUS/TDAP  01/11/2021   URINE MICROALBUMIN  03/11/2021   HEMOGLOBIN A1C  03/30/2021   HPV VACCINES  Aged [employer]  History  Administered Date(s) Administered   DT 01/12/2011   Fluad Quad(high Dose  02/14/2019   Influenza, High Dose 05/20/2014, 05/26/2015, 03/20/2018   Influenza,inj,Quad  03/20/2018   Influenza  04/30/2013, 03/29/2016, 03/02/2017   Pneumococcal -13 05/20/2014   Pneumococcal -23 03/29/2016   Pneumococcal-23 05/20/2008   Zoster, Live 07/15/2014    Last Colon - 05/27/2012 - Dr Hilarie Fredrickson - aged out.  Past Surgical History:  Procedure Laterality Date   BACK SURGERY     3 diff surgeries for vertebrea broken   EYE SURGERY     bilateral cataract surgery   HERNIA REPAIR  1950's   bilateral groin as child   HIATAL HERNIA REPAIR     JOINT REPLACEMENT  04/2010   right knee replacement   TONSILLECTOMY     as child   TOTAL KNEE REVISION  11/20/2011   Procedure: TOTAL KNEE REVISION;  Surgeon: Mauri Pole, MD;  Location: [facility];  Service: Orthopedics;  Laterality: Right;  Right Total Knee Revision     Family History  Problem Relation Age of Onset   Esophageal cancer Father      Social History   Tobacco  Use   Smoking status: Former    Packs/day: 1.00    Years: 20.00    Pack years: 20.00    Types: Cigarettes    Quit date: 06/27/1980    Years since quitting: 40.7   Smokeless tobacco: Never  Substance Use Topics   Alcohol use: No   Drug use: No      ROS Constitutional: Denies fever, chills, weight loss/gain, headaches, insomnia,  night sweats or change in appetite. Does c/o fatigue. Eyes: Denies redness, blurred vision, diplopia, discharge, itchy or watery eyes.  ENT: Denies discharge, congestion, post nasal drip, epistaxis, sore throat, earache, hearing loss, dental pain, Tinnitus, Vertigo, Sinus pain or snoring.  Cardio: Denies chest pain, palpitations, irregular heartbeat, syncope, dyspnea, diaphoresis, orthopnea, PND, claudication or edema Respiratory: denies cough, dyspnea, DOE, pleurisy, hoarseness, laryngitis or wheezing.  Gastrointestinal: Denies dysphagia, heartburn, reflux, water brash, pain, cramps, nausea, vomiting, bloating, diarrhea, constipation, hematemesis, melena, hematochezia, jaundice or hemorrhoids Genitourinary: Denies dysuria, frequency, urgency, nocturia, hesitancy, discharge, hematuria or flank pain Musculoskeletal: Denies arthralgia, myalgia, stiffness, Jt. Swelling, pain, limp or strain/sprain. Denies Falls. Skin: Denies puritis, rash, hives, warts, acne, eczema or change in skin lesion Neuro: No weakness, tremor, incoordination, spasms, paresthesia or pain Psychiatric: Denies confusion, memory loss or sensory loss. Denies Depression. Endocrine: Denies change in weight, skin, hair change, nocturia, and paresthesia, diabetic polys, visual blurring or hyper / hypo glycemic episodes.  Heme/Lymph: No excessive bleeding, bruising or enlarged lymph nodes.   Physical Exam  BP 140/80   Pulse (!) 56   Temp 97.8 F (36.6 C)   Resp 16   Ht 5' 6.3" (1.684 m)   Wt 186 lb (84.4 kg)   SpO2 96%   BMI 29.75 kg/m   General Appearance: Well nourished and well groomed  and in no apparent distress.  Eyes: PERRLA, EOMs, conjunctiva no swelling or erythema, normal fundi and vessels. Sinuses: No frontal/maxillary tenderness ENT/Mouth: EACs patent / TMs  nl. Nares clear without erythema, swelling, mucoid exudates. Oral hygiene is good. No erythema, swelling, or exudate. Tongue normal, non-obstructing. Tonsils not swollen or erythematous. Hearing normal.  Neck: Supple, thyroid not palpable. No bruits, nodes or JVD. Respiratory: Respiratory effort normal.  BS equal and clear bilateral without rales, rhonci, wheezing or stridor. Cardio: Heart sounds are normal with regular rate and  rhythm and no murmurs, rubs or gallops. Peripheral pulses are normal and equal bilaterally without edema. No aortic or femoral bruits. Chest: symmetric with normal excursions and percussion.  Abdomen: Soft, with Nl bowel sounds. Nontender, no guarding, rebound, hernias, masses, or organomegaly.  Lymphatics: Non tender without lymphadenopathy.  Musculoskeletal: Full ROM all peripheral extremities, joint stability, 5/5 strength, and normal gait. Skin: Warm and dry without rashes, lesions, cyanosis, clubbing or  ecchymosis.  Neuro: Cranial nerves intact, reflexes equal bilaterally. Normal muscle tone, no cerebellar symptoms. Sensation intact.  Pysch: Alert and oriented X 3 with normal affect, insight and judgment appropriate.   Assessment and Plan  1. Essential hypertension  - EKG 12-Lead - Urinalysis, Routine w reflex microscopic - Microalbumin / creatinine urine ratio - CBC with Differential/Platelet - COMPLETE METABOLIC PANEL WITH GFR - Magnesium - Hemoglobin A1c  2. Hyperlipidemia associated with type 2 diabetes mellitus (HCC)  - EKG 12-Lead - TSH  3. Type 2 diabetes mellitus with stage 2 chronic kidney disease, without long-term current use of insulin (HCC)  - EKG 12-Lead - HM DIABETES FOOT EXAM - LOW EXTREMITY NEUR EXAM DOCUM - PTH, intact and calcium - Hemoglobin  A1c - Insulin, random  4. Hypothyroidism, unspecified type  - TSH  5. Chronic obstructive pulmonary disease, unspecified COPD type ([place])   6. Gastroesophageal reflux disease without esophagitis  - CBC with Differential/Platelet  7. Chronic venous insufficiency   8. DDD (degenerative disc disease), lumbar   9. Chronic pain syndrome   10. Screening for colorectal cancer  - POC Hemoccult Bld/Stl   11. Screening for heart disease  - EKG 12-Lead  12. FHx: heart disease  - EKG 12-Lead  13. Former smoker  - EKG 12-Lead  14. Screening for AAA (aortic abdominal aneurysm)  - EKG 12-Lead  15. Medication management  - PTH, intact and calcium - CBC with Differential/Platelet - COMPLETE METABOLIC PANEL WITH GFR - Magnesium - TSH - Hemoglobin A1c - Insulin, random  16. 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 Need for influenza vaccination  - Flu vaccine HIGH DOSE PF (Fluzone High dose)          Patient was counseled in prudent diet, weight control to achieve/maintain BMI less than 25, BP monitoring, regular exercise and medications as discussed.  Discussed med effects and SE's. Routine screening labs and tests as requested with regular follow-up as recommended. Over 40 minutes of exam, counseling, chart review and high complex critical decision making was performed   [name], MD

## 2021-03-17 LAB — COMPLETE METABOLIC PANEL WITH GFR
AG Ratio: 1.5 (calc) (ref 1.0–2.5)
ALT: 20 U/L (ref 9–46)
AST: 23 U/L (ref 10–35)
Albumin: 4 g/dL (ref 3.6–5.1)
Alkaline phosphatase (APISO): 65 U/L (ref 35–144)
BUN: 15 mg/dL (ref 7–25)
CO2: 29 mmol/L (ref 20–32)
Calcium: 9.6 mg/dL (ref 8.6–10.3)
Chloride: 104 mmol/L (ref 98–110)
Creat: 0.99 mg/dL (ref 0.70–1.22)
Globulin: 2.6 g/dL (calc) (ref 1.9–3.7)
Glucose, Bld: 81 mg/dL (ref 65–139)
Potassium: 4.8 mmol/L (ref 3.5–5.3)
Sodium: 140 mmol/L (ref 135–146)
Total Bilirubin: 1 mg/dL (ref 0.2–1.2)
Total Protein: 6.6 g/dL (ref 6.1–8.1)
eGFR: 77 mL/min/{1.73_m2} (ref >=60)

## 2021-03-17 LAB — CBC WITH DIFFERENTIAL/PLATELET
Absolute Monocytes: 663 cells/uL (ref 200–950)
Basophils Absolute: 60 cells/uL (ref 0–200)
Basophils Relative: 0.9 %
Eosinophils Absolute: 302 cells/uL (ref 15–500)
Eosinophils Relative: 4.5 %
HCT: 37.7 % — ABNORMAL LOW (ref 38.5–50.0)
Hemoglobin: 12.7 g/dL — ABNORMAL LOW (ref 13.2–17.1)
Lymphs Abs: 2137 cells/uL (ref 850–3900)
MCH: 31.8 pg (ref 27.0–33.0)
MCHC: 33.7 g/dL (ref 32.0–36.0)
MCV: 94.3 fL (ref 80.0–100.0)
MPV: 10.3 fL (ref 7.5–12.5)
Monocytes Relative: 9.9 %
Neutro Abs: 3538 cells/uL (ref 1500–7800)
Neutrophils Relative %: 52.8 %
Platelets: 384 10*3/uL (ref 140–400)
RBC: 4 10*6/uL — ABNORMAL LOW (ref 4.20–5.80)
RDW: 14.7 % (ref 11.0–15.0)
Total Lymphocyte: 31.9 %
WBC: 6.7 10*3/uL (ref 3.8–10.8)

## 2021-03-17 LAB — URINALYSIS, ROUTINE W REFLEX MICROSCOPIC
Bilirubin Urine: NEGATIVE
Glucose, UA: NEGATIVE
Hgb urine dipstick: NEGATIVE
Ketones, ur: NEGATIVE
Leukocytes,Ua: NEGATIVE
Nitrite: NEGATIVE
Protein, ur: NEGATIVE
Specific Gravity, Urine: 1.012 (ref 1.001–1.035)
pH: 6.5 (ref 5.0–8.0)

## 2021-03-17 LAB — PTH, INTACT AND CALCIUM
Calcium: 9.6 mg/dL (ref 8.6–10.3)
PTH: 45 pg/mL (ref 16–77)

## 2021-03-17 LAB — PSA: PSA: 0.58 ng/mL (ref <=4.00)

## 2021-03-17 LAB — MICROALBUMIN / CREATININE URINE RATIO
Creatinine, Urine: 53 mg/dL (ref 20–320)
Microalb Creat Ratio: 15 mcg/mg creat (ref <30)
Microalb, Ur: 0.8 mg/dL

## 2021-03-17 LAB — TSH: TSH: 3.1 mIU/L (ref 0.40–4.50)

## 2021-03-17 LAB — HEMOGLOBIN A1C
Hgb A1c MFr Bld: 5.8 % of total Hgb — ABNORMAL HIGH (ref <5.7)
Mean Plasma Glucose: 120 mg/dL
eAG (mmol/L): 6.6 mmol/L

## 2021-03-17 LAB — INSULIN, RANDOM: Insulin: 16.2 u[IU]/mL

## 2021-03-17 LAB — MAGNESIUM: Magnesium: 1.9 mg/dL (ref 1.5–2.5)

## 2021-03-17 NOTE — Progress Notes (Signed)
============================================================ -   Test results slightly outside the reference range are not unusual. If there is anything important, I will review this with you,  otherwise it is considered normal test values.  If you have further questions,  please do not hesitate to contact me at the office or via My Chart.  ============================================================ ============================================================  -  PSA - Low - Great  ! ============================================================ ============================================================  -  PTH - is a hormone that regulates calcium balance & is Normal ! ============================================================ ============================================================  -  A1c better - down from 6.0% to now 5.8%          (  Ideal or Goal is less than 5.7%  !  ) ============================================================ ============================================================  -  All Else - CBC - Kidneys - U/A - Electrolytes - Liver - Magnesium & Thyroid    - all  Normal / OK ============================================================ ============================================================

## 2021-03-19 ENCOUNTER — Encounter: Payer: Self-pay | Admitting: Internal Medicine

## 2021-09-07 ENCOUNTER — Encounter: Payer: Self-pay | Admitting: Internal Medicine

## 2021-09-12 NOTE — Progress Notes (Signed)
?FOLLOW UP 3 MONTHS ? ?Assessment:  ? ? ?Essential hypertension ?- continue medications, DASH diet, exercise and monitor at home. Call if greater than 130/80.  ?- Go to the ER if any chest pain, shortness of breath, nausea, dizziness, severe HA, changes vision/speech  ?-     CBC with Differential/Platelet ?-     CMP ? ?Hypothyroidism, unspecified type ?Currently on no supplementation ?-     TSH ? ?Mixed hyperlipidemia ?-continue to decrease fatty foods, increase activity.  ?-     Lipid panel ? ?Chronic obstructive pulmonary disease, unspecified COPD type ([place]) ?Avoid triggers, symptoms stable at this time ? ?Gastroesophageal reflux disease, esophagitis presence not specified ?Continue PPI/H2 blocker, diet discussed ? ?Celiac disease ?Continue GI followup, diet controlled ? ?DDD (degenerative disc disease), lumbar ?Continue to monitor, off pain medication  ? ?Arthritis ?Continue to monitor, off pain medication ? ?CKD (chronic kidney disease) stage 2, GFR 60-89 ml/min ?Increase fluids, avoid NSAIDS, monitor sugars, will monitor ?-     CMP ? ?Abnormal glucose ?Discussed general issues about diabetes pathophysiology and management., Educational material distributed., Suggested low cholesterol diet., Encouraged aerobic exercise., Discussed foot care., Reminded to get yearly retinal exam. ?-     Hemoglobin A1c ? 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 ?Vitamin D deficiency ?Continue supplement ? ?Vitamin B12 deficiency ?Normal last OV- 453 ? ?Left-sided low back pain with sciatica, sciatica laterality unspecified, unspecified chronicity ?Off all pain medication and doing well, continue to monitor ? ?Chronic pain syndrome ?Off all pain medication and doing well, continue to monitor ? ?Medication  management ?Continued ? ? ?Over 30 minutes of face to face interview, exam, counseling, chart review and critical decision making was performed ? ?Future Appointments  ?Date Time Provider [place]  ?03/16/2022  3:00 PM Unk Pinto, MD [facility] None  ? ? ? ? ?Subjective:  ?[name] Fernandez is a 81 y.o. male who presents for Medicare Annual Wellness Visit and follow up  ?  ?He is off all his pain medication and back pain is controlled.  ? ?His blood pressure has been controlled at home,Currently on Maxzide 37.5/25 mg daily today their BP is BP: (!) 142/80  ?BP Readings from Last 3 Encounters:  ?09/14/21 (!) 142/80  ?03/16/21 140/80  ?09/28/20 130/62  ? ? ? He does not workout due to pain/swelling in his legs. He denies chest pain, shortness of breath, dizziness. ? He is not on cholesterol medication, was suppose to be on crestor but stopped it and he is on RYRE. His cholesterol is at goal. The cholesterol last visit was:   ?Lab Results  ?Component Value Date  ? CHOL 197 09/28/2020  ? HDL 33 (L) 09/28/2020  ? LDLCALC 133 (H) 09/28/2020  ? TRIG 179 (H) 09/28/2020  ? CHOLHDL 6.0 (H) 09/28/2020  ? ? He has been working on diet and exercise for prediabetes, and denies paresthesia of the feet, polydipsia, polyuria and visual disturbances. Last A1C in the office was:  ?Lab Results  ?Component Value Date  ? HGBA1C 5.8 (H) 03/16/2021  ? ?Patient is on Vitamin D supplement.   ?Lab Results  ?Component Value Date  ? VD25OH 44 09/28/2020  ?   ?BMI is Body mass index is 30.04 kg/m?., he is working on diet and exercise. Will have some  SOB with exertion, unchanged, no cough or wheezing with it. No CP or other accompaniments with it. He does do some walking.  ?Last CXR 2015 ?Wt Readings from Last 3 Encounters:  ?09/14/21 187 lb 12.8 oz (85.2 kg)  ?03/16/21 186 lb (84.4 kg)  ?09/28/20 189 lb 9.6 oz (86 kg)  ? ?  ?Lab Results  ?Component Value Date  ? GFRNONAA 65 09/28/2020  ? ?He is on OTC thyroid medication complex. His  medication was changed last visit. ?Lab Results  ?Component Value Date  ? TSH 3.10 03/16/2021  ?Marland Kitchen  ?He has a history of testosterone deficiency . He does take Zinc. He states that the testosterone helps with his energy, libido, muscle mass. ?Lab Results  ?Component Value Date  ? TESTOSTERONE 193 (L) 09/28/2020  ? ?He has persistent itching in his legs and uses Hydroxyzine.  It does help with symptoms ? ?Medication Review: ? ? ?Current Outpatient Medications (Cardiovascular):  ?  triamterene-hydrochlorothiazide (MAXZIDE-25) 37.5-25 MG tablet, Take 1 tablet by mouth daily. ? ? ? ? ?Current Outpatient Medications (Other):  ?  Ascorbic Acid (VITAMIN C) 1000 MG tablet, Take 1,000 mg by mouth daily. ?  Cholecalciferol (VITAMIN D PO), Take 5,000 Int'l Units by mouth daily. ?  COD LIVER OIL PO, Take by mouth. ?  omeprazole (PRILOSEC) 20 MG capsule, Take 1 capsule (20 mg total) by mouth daily. ?  Red Yeast Rice 600 MG CAPS, PRN ?  Sennosides (SENOKOT PO), Take by mouth as needed. ?  triamcinolone cream (KENALOG) 0.5 %, Apply 1 application topically 2 (two) times daily. ?  Zinc 50 MG TABS, Take by mouth. ?  hydrOXYzine (ATARAX) 25 MG tablet, Take 1 tablet (25 mg total) by mouth 3 (three) times daily as needed for itching. ? ?Allergies: ?Allergies  ?Allergen Reactions  ? Feraheme [Ferumoxytol] Other (See Comments)  ?  Swelling of lips and tongue, could not talk 30 minutes after the infusion.  ? Naprosyn [Naproxen] Swelling  ?  Unsure if naprosyn caused this reaction  ? Latex   ? Levothyroxine   ?  Lip swelling  ? Doxycycline Nausea Only  ? ? ?Current Problems (verified) ?Patient Active Problem List  ? Diagnosis Date Noted  ? Anemia 03/13/2019  ? Chronic venous insufficiency 03/12/2019  ? CKD (chronic kidney disease) stage 2, 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/2015  ? COPD (chronic obstructive pulmonary disease) ([place]) 12/08/2015  ? Chronic pain syndrome 05/26/2015  ? DDD (degenerative disc disease), lumbar 05/26/2015  ? Medication  management 05/26/2015  ? 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  ? Lumbago with sciatica 08/06/2014  ? Hyperlipidemia 08/20/2013  ? Hypertension   ? Abnormal glucose   ? Vitamin D deficiency   ? Arthritis   ? Vitamin B12 deficiency   ? Celiac disease 06/19/2012  ? GERD (gastroesophageal reflux disease) 04/30/2012  ? 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 tube) procedure 04/30/2012  ? Hypothyroidism 04/30/2012  ? 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 ? ?Screening Tests ?Immunization History  ?Administered Date(s) Administered  ? DT (Pediatric) 01/12/2011  ? Fluad Quad(high Dose 65+) 02/14/2019  ? Influenza, High Dose Seasonal PF 05/20/2014, 05/26/2015, 03/20/2018, 03/16/2021  ? Influenza,inj,Quad PF,6+ Mos 03/20/2018  ? Influenza-Unspecified 04/30/2013, 03/29/2016, 03/02/2017  ? Pneumococcal Conjugate-13 05/20/2014  ? Pneumococcal Polysaccharide-23 03/29/2016  ? Pneumococcal-Unspecified 05/20/2008  ? Zoster, Live 07/15/2014  ? ?Health Maintenance  ?Topic Date Due  ? COVID-19 Vaccine (1) Never done  ? Zoster Vaccines- Shingrix (1 of 2) Never done  ? OPHTHALMOLOGY EXAM  07/11/2017  ?  TETANUS/TDAP  01/11/2021  ? HEMOGLOBIN A1C  09/14/2021  ? INFLUENZA VACCINE  01/10/2022  ? FOOT EXAM  03/16/2022  ? URINE MICROALBUMIN  03/16/2022  ? Pneumonia Vaccine 81+ Years old  Completed  ? HPV VACCINES  Aged Out  ? ? ?Tetanus: 2012   ?Pneumovax: 2017  ?Prevnar 13: 2015 ?Flu vaccine: 2020 ?Zostavax: 2016 ?COVID declines ? ?Colonoscopy: 05/2012 Dr. Hilarie Fredrickson  Last one  ?EGD: 05/2012 ? ?Names of Other Physician/Practitioners you currently use: ?1. [facility] here for primary care ?2. Lindalou Hose, eye doctor, last visit 07/2016 ?Patient Care Team: ?Unk Pinto, MD as PCP - General (Internal Medicine) ?Nicholaus Bloom, MD as Consulting Physician (Anesthesiology) ?Pyrtle, Lajuan Lines, MD as Consulting Physician (Gastroenterology) ? ? ? ?Review of Systems  ?Constitutional:  Negative for chills, diaphoresis,  fever, malaise/fatigue and weight loss.  ?HENT:  Negative for congestion, ear discharge, ear pain, hearing loss, nosebleeds, sore throat and tinnitus.   ?Eyes: Negative.   ?Respiratory:  Negative for cough, hemoptysis, sputum pr

## 2021-09-14 ENCOUNTER — Encounter: Payer: Self-pay | Admitting: Internal Medicine

## 2021-09-14 ENCOUNTER — Ambulatory Visit (INDEPENDENT_AMBULATORY_CARE_PROVIDER_SITE_OTHER): Payer: Medicare HMO | Admitting: Nurse Practitioner

## 2021-09-14 ENCOUNTER — Encounter: Payer: Self-pay | Admitting: Nurse Practitioner

## 2021-09-14 VITALS — BP 142/80 | HR 70 | Temp 97.7°F | Wt 187.8 lb

## 2021-09-14 DIAGNOSIS — K900 Celiac disease: Secondary | ICD-10-CM | POA: Diagnosis not present

## 2021-09-14 DIAGNOSIS — E039 Hypothyroidism, unspecified: Secondary | ICD-10-CM

## 2021-09-14 DIAGNOSIS — J449 Chronic obstructive pulmonary disease, unspecified: Secondary | ICD-10-CM | POA: Diagnosis not present

## 2021-09-14 DIAGNOSIS — K219 Gastro-esophageal reflux disease without esophagitis: Secondary | ICD-10-CM | POA: Diagnosis not present

## 2021-09-14 DIAGNOSIS — R7309 Other abnormal glucose: Secondary | ICD-10-CM

## 2021-09-14 DIAGNOSIS — I872 Venous insufficiency (chronic) (peripheral): Secondary | ICD-10-CM

## 2021-09-14 DIAGNOSIS — Z79899 Other long term (current) drug therapy: Secondary | ICD-10-CM

## 2021-09-14 DIAGNOSIS — M5136 Other intervertebral disc degeneration, lumbar region: Secondary | ICD-10-CM

## 2021-09-14 DIAGNOSIS — E538 Deficiency of other specified B group vitamins: Secondary | ICD-10-CM

## 2021-09-14 DIAGNOSIS — E782 Mixed hyperlipidemia: Secondary | ICD-10-CM

## 2021-09-14 DIAGNOSIS — I10 Essential (primary) hypertension: Secondary | ICD-10-CM

## 2021-09-14 DIAGNOSIS — E559 Vitamin D deficiency, unspecified: Secondary | ICD-10-CM

## 2021-09-14 DIAGNOSIS — N182 Chronic kidney disease, stage 2 (mild): Secondary | ICD-10-CM | POA: Diagnosis not present

## 2021-09-14 DIAGNOSIS — L309 Dermatitis, unspecified: Secondary | ICD-10-CM

## 2021-09-14 DIAGNOSIS — G894 Chronic pain syndrome: Secondary | ICD-10-CM

## 2021-09-14 MED ORDER — TRIAMTERENE-HCTZ 37.5-25 MG PO TABS: 1.0000 | ORAL_TABLET | Freq: Every day | ORAL | 3 refills | Status: DC

## 2021-09-14 MED ORDER — HYDROXYZINE HCL 25 MG PO TABS: 25.0000 mg | ORAL_TABLET | Freq: Three times a day (TID) | ORAL | 0 refills | Status: DC | PRN

## 2021-09-14 NOTE — Patient Instructions (Signed)
Betadine Sugar ?- Regular white sugar mixed with Betadine(Iodine) make a paste like peanut butter thickness and apply to ulcerated areas daily until healed.  Cover with a dressing as can stain clothes ?

## 2021-09-15 LAB — COMPLETE METABOLIC PANEL WITH GFR
AG Ratio: 1.4 (calc) (ref 1.0–2.5)
ALT: 24 U/L (ref 9–46)
AST: 27 U/L (ref 10–35)
Albumin: 4.2 g/dL (ref 3.6–5.1)
Alkaline phosphatase (APISO): 79 U/L (ref 35–144)
BUN: 12 mg/dL (ref 7–25)
CO2: 30 mmol/L (ref 20–32)
Calcium: 10 mg/dL (ref 8.6–10.3)
Chloride: 96 mmol/L — ABNORMAL LOW (ref 98–110)
Creat: 0.97 mg/dL (ref 0.70–1.22)
Globulin: 3.1 g/dL (calc) (ref 1.9–3.7)
Glucose, Bld: 93 mg/dL (ref 65–99)
Potassium: 5.5 mmol/L — ABNORMAL HIGH (ref 3.5–5.3)
Sodium: 131 mmol/L — ABNORMAL LOW (ref 135–146)
Total Bilirubin: 1 mg/dL (ref 0.2–1.2)
Total Protein: 7.3 g/dL (ref 6.1–8.1)
eGFR: 79 mL/min/{1.73_m2} (ref >=60)

## 2021-09-15 LAB — CBC WITH DIFFERENTIAL/PLATELET
Absolute Monocytes: 611 cells/uL (ref 200–950)
Basophils Absolute: 71 cells/uL (ref 0–200)
Basophils Relative: 1 %
Eosinophils Absolute: 241 cells/uL (ref 15–500)
Eosinophils Relative: 3.4 %
HCT: 39.4 % (ref 38.5–50.0)
Hemoglobin: 13.6 g/dL (ref 13.2–17.1)
Lymphs Abs: 2272 cells/uL (ref 850–3900)
MCH: 33 pg (ref 27.0–33.0)
MCHC: 34.5 g/dL (ref 32.0–36.0)
MCV: 95.6 fL (ref 80.0–100.0)
MPV: 10.7 fL (ref 7.5–12.5)
Monocytes Relative: 8.6 %
Neutro Abs: 3905 cells/uL (ref 1500–7800)
Neutrophils Relative %: 55 %
Platelets: 362 10*3/uL (ref 140–400)
RBC: 4.12 10*6/uL — ABNORMAL LOW (ref 4.20–5.80)
RDW: 13.9 % (ref 11.0–15.0)
Total Lymphocyte: 32 %
WBC: 7.1 10*3/uL (ref 3.8–10.8)

## 2021-09-15 LAB — LIPID PANEL
Cholesterol: 175 mg/dL (ref <200)
HDL: 36 mg/dL — ABNORMAL LOW (ref >=40)
LDL Cholesterol (Calc): 116 mg/dL (calc) — ABNORMAL HIGH
Non-HDL Cholesterol (Calc): 139 mg/dL (calc) — ABNORMAL HIGH (ref <130)
Total CHOL/HDL Ratio: 4.9 (calc) (ref <5.0)
Triglycerides: 120 mg/dL (ref <150)

## 2021-09-15 LAB — TSH: TSH: 4.03 mIU/L (ref 0.40–4.50)

## 2021-09-15 LAB — HEMOGLOBIN A1C
Hgb A1c MFr Bld: 5.9 % of total Hgb — ABNORMAL HIGH (ref <5.7)
Mean Plasma Glucose: 123 mg/dL
eAG (mmol/L): 6.8 mmol/L

## 2021-09-27 NOTE — Progress Notes (Signed)
Left message to call back and get NV scheduled

## 2021-10-14 ENCOUNTER — Encounter: Payer: Self-pay | Admitting: Internal Medicine

## 2021-10-19 ENCOUNTER — Encounter (HOSPITAL_BASED_OUTPATIENT_CLINIC_OR_DEPARTMENT_OTHER): Payer: Medicare HMO | Attending: Physician Assistant | Admitting: Physician Assistant

## 2021-10-19 ENCOUNTER — Other Ambulatory Visit (HOSPITAL_COMMUNITY): Payer: Self-pay | Admitting: Physician Assistant

## 2021-10-19 DIAGNOSIS — E11621 Type 2 diabetes mellitus with foot ulcer: Secondary | ICD-10-CM | POA: Diagnosis not present

## 2021-10-19 DIAGNOSIS — E785 Hyperlipidemia, unspecified: Secondary | ICD-10-CM | POA: Insufficient documentation

## 2021-10-19 DIAGNOSIS — L958 Other vasculitis limited to the skin: Secondary | ICD-10-CM | POA: Insufficient documentation

## 2021-10-19 DIAGNOSIS — K449 Diaphragmatic hernia without obstruction or gangrene: Secondary | ICD-10-CM | POA: Insufficient documentation

## 2021-10-19 DIAGNOSIS — E559 Vitamin D deficiency, unspecified: Secondary | ICD-10-CM | POA: Insufficient documentation

## 2021-10-19 DIAGNOSIS — I10 Essential (primary) hypertension: Secondary | ICD-10-CM | POA: Insufficient documentation

## 2021-10-19 DIAGNOSIS — M5430 Sciatica, unspecified side: Secondary | ICD-10-CM | POA: Insufficient documentation

## 2021-10-19 DIAGNOSIS — R52 Pain, unspecified: Secondary | ICD-10-CM

## 2021-10-19 DIAGNOSIS — G8929 Other chronic pain: Secondary | ICD-10-CM | POA: Insufficient documentation

## 2021-10-19 DIAGNOSIS — L97522 Non-pressure chronic ulcer of other part of left foot with fat layer exposed: Secondary | ICD-10-CM | POA: Diagnosis not present

## 2021-10-19 DIAGNOSIS — E11622 Type 2 diabetes mellitus with other skin ulcer: Secondary | ICD-10-CM | POA: Diagnosis not present

## 2021-10-19 DIAGNOSIS — I890 Lymphedema, not elsewhere classified: Secondary | ICD-10-CM | POA: Insufficient documentation

## 2021-10-19 DIAGNOSIS — Z87891 Personal history of nicotine dependence: Secondary | ICD-10-CM | POA: Diagnosis not present

## 2021-10-19 DIAGNOSIS — I87333 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 extremity: Secondary | ICD-10-CM | POA: Insufficient documentation

## 2021-10-19 DIAGNOSIS — L97512 Non-pressure chronic ulcer of other part of right foot with fat layer exposed: Secondary | ICD-10-CM | POA: Insufficient documentation

## 2021-10-19 DIAGNOSIS — K222 Esophageal obstruction: Secondary | ICD-10-CM | POA: Insufficient documentation

## 2021-10-19 NOTE — Progress Notes (Addendum)
ELYAN, VANWIEREN ([id number]) ?Visit Report for 10/19/2021 ?Chief Complaint Document Details ?Patient Name: Date of Service: ?[name] Fernandez, [name] [name] 10/19/2021 8:00 A M ?Medical Record Number: [id number] ?Patient Account Number: [id number] ?Date of Birth/Sex: Treating RN: ?27-Jul-1940 (81 y.o. [name] Fernandez ?Primary Care Provider: [name] Other Clinician: ?Referring Provider: ?Treating Provider/Extender: [name] ?[name] ?Weeks in Treatment: 0 ?Information Obtained from: Patient ?Chief Complaint ?Bilateral foot ulcers ?Electronic Signature(s) ?Signed: 10/19/2021 8:50:11 AM By: [name] PA-C ?Entered By: [name] on 10/19/2021 08:50:10 ?-------------------------------------------------------------------------------- ?Debridement Details ?Patient Name: Date of Service: ?[name] Fernandez, [name] [name] 10/19/2021 8:00 A M ?Medical Record Number: [id number] ?Patient Account Number: [id number] ?Date of Birth/Sex: Treating RN: ?12-29-1940 (81 y.o. [name] Fernandez ?Primary Care Provider: [name] Other Clinician: ?Referring Provider: ?Treating Provider/Extender: [name] ?[name] ?Weeks in Treatment: 0 ?Debridement Performed for Assessment: Wound #6 Left,Lateral Foot ?Performed By: Physician [name], PA ?Debridement Type: Debridement ?Level of Consciousness (Pre-procedure): Awake and Alert ?Pre-procedure Verification/Time Out Yes - 08:59 ?Taken: ?Start Time: 09:00 ?Pain Control: ?Other : Benzocaine ?T Area Debrided (L x W): ?otal 6.2 (cm) x 4 (cm) = 24.8 (cm?) ?Tissue and other material debrided: Non-Viable, [place], [place] ?Level: Non-Viable Tissue ?Debridement Description: Selective/Open Wound ?Instrument: Curette ?Bleeding: Minimum ?Hemostasis Achieved: Pressure ?End Time: 09:06 ?Response to Treatment: Procedure was tolerated well ?Level of Consciousness (Post- Awake and Alert ?procedure): ?Post Debridement Measurements of Total Wound ?Length: (cm) 6.2 ?Width: (cm)  4 ?Depth: (cm) 0.2 ?Volume: (cm?) 3.896 ?Character of Wound/Ulcer Post Debridement: Stable ?Post Procedure Diagnosis ?Same as Pre-procedure ?Electronic Signature(s) ?Signed: 10/19/2021 6:03:33 PM By: [name] PA-C ?Signed: 10/19/2021 7:19:37 PM By: [name] ?Entered By: [name] on 10/19/2021 09:09:39 ?-------------------------------------------------------------------------------- ?Debridement Details ?Patient Name: ?Date of Service: ?[name] Fernandez, [name] [name] 10/19/2021 8:00 A M ?Medical Record Number: [id number] ?Patient Account Number: [id number] ?Date of Birth/Sex: ?Treating RN: ?August 14, 1940 (81 y.o. [name] Fernandez ?Primary Care Provider: [name] ?Other Clinician: ?Referring Provider: ?Treating Provider/Extender: [name] ?[name] ?Weeks in Treatment: 0 ?Debridement Performed for Assessment: Wound #7 Right,Lateral Foot ?Performed By: Physician [name], PA ?Debridement Type: Debridement ?Level of Consciousness (Pre-procedure): Awake and Alert ?Pre-procedure Verification/Time Out Yes - 08:59 ?Taken: ?Start Time: 09:06 ?Pain Control: ?Other : Benzocaine ?T Area Debrided (L x W): ?otal 0.3 (cm) x 0.4 (cm) = 0.12 (cm?) ?Tissue and other material debrided: Non-Viable, [place], [place] ?Level: Non-Viable Tissue ?Debridement Description: Selective/Open Wound ?Instrument: Curette ?Bleeding: Minimum ?Hemostasis Achieved: Pressure ?End Time: 09:08 ?Response to Treatment: Procedure was tolerated well ?Level of Consciousness (Post- Awake and Alert ?procedure): ?Post Debridement Measurements of Total Wound ?Length: (cm) 0.3 ?Width: (cm) 0.4 ?Depth: (cm) 0.3 ?Volume: (cm?) 0.028 ?Character of Wound/Ulcer Post Debridement: Stable ?Post Procedure Diagnosis ?Same as Pre-procedure ?Electronic Signature(s) ?Signed: 10/19/2021 6:03:33 PM By: [name] PA-C ?Signed: 10/19/2021 7:19:37 PM By: [name] ?Entered By: [name] on 10/19/2021  09:10:10 ?-------------------------------------------------------------------------------- ?HPI Details ?Patient Name: ?Date of Service: ?[name] Fernandez, [name] [name] 10/19/2021 8:00 A M ?Medical Record Number: [id number] ?Patient Account Number: [id number] ?Date of Birth/Sex: ?Treating RN: ?Sep 22, 1940 (81 y.o. [name] Fernandez ?Primary Care Provider: [name] ?Other Clinician: ?Referring Provider: ?Treating Provider/Extender: [name] ?[name] ?Weeks in Treatment: 0 ?History of Present Illness ?HPI Description: 81 year old gentleman known to have swelling of his left lower extremity with some ulceration along the medial ankle has been having these ?problems  for the last 3-4 months and does not know how it came on. No history of injury or no history of any infection in this area. He is known to have ?diabetes mellitus controlled with diet, hyperlipidemia, hypertension and chronic lumbar back pain with sciatica which is being treated by his PCP. Last ?hemoglobin A1c was 6.3 in June 2017. Last medical history is significant for esophageal stricture, hiatal hernia, vitamin D deficiency, status post back surgery ?3, hernia repair as a child for both groins, hiatal hernia repair, joint replacement and revision of a knee surgery on the right side. He has quit smoking in 1982. ?He has never had a Doppler study of his lower extremity except remotely when he had knee surgery they looked for a blood clot on the right side. ?09/13/2016 -- venous reflux study done on 09/12/2016 showed there is evidence of greater saphenous vein reflux in the left lower extremity and the small ?saphenous vein and the posterior thigh is not competent. 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 With these results he definitely needs a ?referral to the vascular surgeons ?09/20/2016 -- he has an appointment to see the vascular surgeons on May 1 ?10/03/2016 -- patient had a 4 layer compression on his left lower  extremity and had a lot of discomfort and pain. ?10/11/2016-- was seen by [name] -- review of his data he recommended staged laser ablation of his great and then small saphenous veins for reduction ?odd ?of his venous hypertension. He did examine his right leg with the SonoSite and he has dilatation of the saphenous vein on the right lower extremity too. Since ?there was no evidence of ulceration he recommended observation of the right lower extremity. He recommended to continue with local wound care at the wound ?center until his wound was completely healed. ?10/18/2016 -- the patient has not been very compliant with his compression, his diet and his diuretics and as a result his lymphedema has increased markedly ?10/25/2016 -- he has been compliant this week, has worn his 2 press compression wraps, taken his diuretics and is watching his salt intake. ?11/08/2016 -- he had his venous procedure done last week and details of this have been reported and reviewed in his electronic medical record. he had a laser ?ablation of his great saphenous vein and this was done from mid calf to just below the saphenofemoral junction. He would return next week for ultrasound ?follow-up. He will then undergo the small saphenous vein ablation in a few weeks. ?11/15/2016 -- his postprocedure visit showed good closure of his left great saphenous vein from the mid calf to 1-1/2 cm from the saphenofemoral junction. ?He had excellent early results from the ablation. The ablation of the left small saphenous vein was planned in a week ?12/06/2016 -- he had his small saphenous vein venous ablation a week before and the duplex showed closure of his small saphenous vein to within half ?centimeter office saphenous popliteal junction with no DVT and he was pleased with the results. He recommended continue with elevation and compression and ?to see them back on an as-needed basis ?12/19/16; the patient's wound is actually closed. Sometime  over the weekend he developed a small abrasion on the lower calf just above the original wound on ?the left medial malleolus although this is closed as well ?Readmission: ?01/18/18 on evaluation today patient presents after having last been seen in

## 2021-10-19 NOTE — Progress Notes (Signed)
[name] Fernandez, [name] Fernandez ([id number]) ?Visit Report for 10/19/2021 ?Allergy List Details ?Patient Name: Date of Service: ?[name] Fernandez, [name] [name] 10/19/2021 8:00 A M ?Medical Record Number: [id number] ?Patient Account Number: [id number] ?Date of Birth/Sex: Treating RN: ?Aug 28, 1940 (81 y.o. [name] Fernandez ?Primary Care [name] Fernandez: [name] Fernandez Other Clinician: ?Referring [name] Fernandez: ?Treating [name] Fernandez/Extender: [name] Fernandez ?[name] Fernandez ?Weeks in Treatment: 0 ?Allergies ?Active Allergies ?latex ?Reaction: blisters ?Severity: Severe ?Feraheme ?Naprosyn ?levothyroxine ?Allergy Notes ?Electronic Signature(s) ?Signed: 10/19/2021 7:19:37 PM By: [name] Fernandez ?Entered By: [name] Fernandez on 10/19/2021 08:06:55 ?-------------------------------------------------------------------------------- ?Arrival Information Details ?Patient Name: Date of Service: ?[name] Fernandez, [name] [name] 10/19/2021 8:00 A M ?Medical Record Number: [id number] ?Patient Account Number: [id number] ?Date of Birth/Sex: Treating RN: ?[birth year] (81 y.o. [name] Fernandez ?Primary Care [name] Fernandez: [name] Fernandez Other Clinician: ?Referring [name] Fernandez: ?Treating [name] Fernandez/Extender: [name] Fernandez ?[name] Fernandez ?Weeks in Treatment: 0 ?Visit Information ?Patient Arrived: Ambulatory ?Arrival Time: 08:00 ?Accompanied By: wife ?Transfer Assistance: None ?Patient Identification Verified: Yes ?Secondary Verification Process Completed: Yes ?Patient Requires Transmission-Based Precautions: No ?Patient Has Alerts: No ?History Since Last Visit ?Added or deleted any medications: No ?Any new allergies or adverse reactions: No ?Had a fall or experienced change in activities of daily living that may affect risk of falls: No ?Signs or symptoms of abuse/neglect since last visito No ?Hospitalized since last visit: No ?Implantable device outside of the clinic excluding cellular tissue based products placed in the center since last visit: No ?Has Dressing in Place as Prescribed: Yes ?Pain  Present Now: Yes ?Electronic Signature(s) ?Signed: 10/19/2021 7:19:37 PM By: [name] Fernandez ?Entered By: [name] Fernandez on 10/19/2021 08:04:00 ?-------------------------------------------------------------------------------- ?Clinic Level of Care Assessment Details ?Patient Name: Date of Service: ?[name] Fernandez, [name] [name] 10/19/2021 8:00 A M ?Medical Record Number: [id number] ?Patient Account Number: [id number] ?Date of Birth/Sex: Treating RN: ?1940-12-04 (81 y.o. [name] Fernandez ?Primary Care [name] Fernandez: [name] Fernandez Other Clinician: ?Referring [name] Fernandez: ?Treating [name] Fernandez/Extender: [name] Fernandez ?[name] Fernandez ?Weeks in Treatment: 0 ?Clinic Level of Care Assessment Items ?TOOL 1 Quantity Score ?X- 1 0 ?Use when EandM and Procedure is performed on INITIAL visit ?ASSESSMENTS - Nursing Assessment / Reassessment ?X- 1 20 ?General Physical Exam (combine w/ comprehensive assessment (listed just below) when performed on new pt. evals) ?X- 1 25 ?Comprehensive Assessment (HX, ROS, Risk Assessments, Wounds Hx, etc.) ?ASSESSMENTS - Wound and Skin Assessment / Reassessment ?[]  - 0 ?Dermatologic / Skin Assessment (not related to wound area) ?ASSESSMENTS - Ostomy and/or Continence Assessment and Care ?[]  - 0 ?Incontinence Assessment and Management ?[]  - 0 ?Ostomy Care Assessment and Management (repouching, etc.) ?PROCESS - Coordination of Care ?[]  - 0 ?Simple Patient / Family Education for ongoing care ?X- 1 20 ?Complex (extensive) Patient / Family Education for ongoing care ?X- 1 10 ?Staff obtains Consents, Records, T Results / Process Orders ?est ?X- 1 10 ?Staff telephones HHA, Nursing Homes / Clarify orders / etc ?[]  - 0 ?Routine Transfer to another Facility (non-emergent condition) ?[]  - 0 ?Routine Hospital Admission (non-emergent condition) ?[]  - 0 ?New Admissions / Biomedical engineer / Ordering NPWT Apligraf, etc. ?, ?[]  - 0 ?Emergency Hospital Admission (emergent condition) ?PROCESS - Special Needs ?[]  -  0 ?Pediatric / Minor Patient Management ?[]  - 0 ?Isolation Patient Management ?[]  - 0 ?Hearing / Language / Visual special needs ?[]  - 0 ?Assessment of Community assistance (transportation, Fernandez/C planning, etc.) ?[]  - 0 ?Additional assistance / Altered mentation ?[]  - 0 ?Support Surface(s) Assessment (bed, cushion, seat,  etc.) ?INTERVENTIONS - Miscellaneous ?[]  - 0 ?External ear exam ?[]  - 0 ?Patient Transfer (multiple staff / Civil Service fast streamer / Similar devices) ?[]  - 0 ?Simple Staple / Suture removal (25 or less) ?[]  - 0 ?Complex Staple / Suture removal (26 or more) ?[]  - 0 ?Hypo/Hyperglycemic Management (do not check if billed separately) ?X- 1 15 ?Ankle / Brachial Index (ABI) - do not check if billed separately ?Has the patient been seen at the hospital within the last three years: Yes ?Total Score: 100 ?Level Of Care: New/Established - Level 3 ?Electronic Signature(s) ?Signed: 10/19/2021 7:19:37 PM By: [name] Fernandez ?Entered By: [name] Fernandez on 10/19/2021 09:27:30 ?-------------------------------------------------------------------------------- ?Encounter Discharge Information Details ?Patient Name: ?Date of Service: ?[name] Fernandez, [name] [name] 10/19/2021 8:00 A M ?Medical Record Number: [id number] ?Patient Account Number: [id number] ?Date of Birth/Sex: ?Treating RN: ?06/26/1940 (81 y.o. [name] Fernandez ?Primary Care [name] Fernandez: [name] Fernandez ?Other Clinician: ?Referring [name]: ?Treating [name] Fernandez/Extender: [name] Fernandez ?[name] Fernandez ?Weeks in Treatment: 0 ?Encounter Discharge Information Items Post Procedure Vitals ?Discharge Condition: Stable ?Temperature (F): 98. ?Ambulatory Status: Ambulatory ?Pulse (bpm): 70 ?Discharge Destination: Home ?Respiratory Rate (breaths/min): 18 ?Transportation: Private Auto ?Blood Pressure (mmHg): 165/74 ?Accompanied By: wife ?Schedule Follow-up Appointment: Yes ?Clinical Summary of Care: Provided on 10/19/2021 ?Form Type Recipient ?Paper Patient Patient ?Electronic  Signature(s) ?Signed: 10/19/2021 7:19:37 PM By: [name] Fernandez ?Entered By: [name] Fernandez on 10/19/2021 09:40:25 ?-------------------------------------------------------------------------------- ?Lower Extremity Assessment Details ?Patient Name: ?Date of Service: ?[name] Fernandez, [name] [name] 10/19/2021 8:00 A M ?Medical Record Number: [id number] ?Patient Account Number: [id number] ?Date of Birth/Sex: ?Treating RN: ?April 30, 1941 (81 y.o. [name] Fernandez ?Primary Care Xandria Gallaga: [name] Fernandez ?Other Clinician: ?Referring Raechal Raben: ?Treating Hayato Guaman/Extender: [name] Fernandez ?[name] Fernandez ?Weeks in Treatment: 0 ?Edema Assessment ?Assessed: [Left: Yes] [Right: Yes] ?Edema: [Left: Yes] [Right: Yes] ?Calf ?Left: Right: ?Point of Measurement: 32 cm From Medial Instep 40 cm 42.4 cm ?Ankle ?Left: Right: ?Point of Measurement: 10 cm From Medial Instep 26 cm 28 cm ?Knee To Floor ?Left: Right: ?From Medial Instep 37 cm 37 cm ?Vascular Assessment ?Pulses: ?Dorsalis Pedis ?Palpable: [Left:Yes] [Right:Yes] ?Blood Pressure: ?Brachial: [Left:165] [Right:165] ?Ankle: ?[Left:Dorsalis Pedis: 190 1.15] ?[Right:Dorsalis Pedis: 168 1.02] ?Electronic Signature(s) ?Signed: 10/19/2021 7:19:37 PM By: [name] Fernandez ?Entered By: [name] Fernandez on 10/19/2021 08:23:43 ?-------------------------------------------------------------------------------- ?Multi Wound Chart Details ?Patient Name: ?Date of Service: ?[name] Fernandez, [name] [name] 10/19/2021 8:00 A M ?Medical Record Number: [id number] ?Patient Account Number: [id number] ?Date of Birth/Sex: ?Treating RN: ?January 18, 1941 (81 y.o. [name] Fernandez ?Primary Care Jassmine Vandruff: [name] Fernandez ?Other Clinician: ?Referring Aron Needles: ?Treating Monica Zahler/Extender: [name] Fernandez ?[name] Fernandez ?Weeks in Treatment: 0 ?Vital Signs ?Height(in): 66 ?Pulse(bpm): 70 ?Weight(lbs): 184 ?Blood Pressure(mmHg): 165/74 ?Body Mass Index(BMI): 29.7 ?Temperature(??F): 98.0 ?Respiratory Rate(breaths/min): 18 ?Photos:  [N/A:N/A] ?Left, Lateral Foot Right, Lateral Foot N/A ?Wound Location: ?Gradually Appeared Gradually Appeared N/A ?Wounding Event: ?Venous Leg Ulcer Venous Leg Ulcer N/A ?Primary Etiology: ?Anemia, Chronic Obstructive Anemia, Chronic O

## 2021-10-19 NOTE — Progress Notes (Signed)
[name] Fernandez, [name] Fernandez ([id number]) ?Visit Report for 10/19/2021 ?Abuse Risk Screen Details ?Patient Name: Date of Service: ?[name] Fernandez, [name] [name] Fernandez. 10/19/2021 8:00 A M ?Medical Record Number: [id number] ?Patient Account Number: [id number] ?Date of Birth/Sex: Treating RN: ?[birth year] Fernandez (81 y.o. [name] Fernandez ?Primary Care [name] Fernandez: [name] Fernandez Other Clinician: ?Referring [name] Fernandez: ?Treating [name] Fernandez/Extender: [name] Fernandez ?[name] Fernandez ?Weeks in Treatment: 0 ?Abuse Risk Screen Items ?Answer ?ABUSE RISK SCREEN: ?Has anyone close to you tried to hurt or harm you recentlyo No ?Do you feel uncomfortable with anyone in your familyo No ?Has anyone forced you do things that you didnt want to doo No ?Electronic Signature(s) ?Signed: 10/19/2021 7:19:37 PM By: [name] Fernandez ?Entered By: [name] Fernandez on 10/19/2021 08:12:19 ?-------------------------------------------------------------------------------- ?Activities of Daily Living Details ?Patient Name: Date of Service: ?[name] Fernandez, [name] [name] Fernandez. 10/19/2021 8:00 A M ?Medical Record Number: [id number] ?Patient Account Number: [id number] ?Date of Birth/Sex: Treating RN: ?Fern[birth year] (81 y.o. [name] Fernandez ?Primary Care [name] Fernandez: [name] Fernandez Other Clinician: ?Referring [name] Fernandez: ?Treating [name] Fernandez/Extender: [name] Fernandez ?[name] Fernandez ?Weeks in Treatment: 0 ?Activities of Daily Living Items ?Answer ?Activities of Daily Living (Please select one for each item) ?Hasty ?T Medications ?ake Completely Able ?Use T elephone Completely Able ?Care for Appearance Completely Able ?Use T oilet Completely Able ?Bath / Shower Completely Able ?Dress Self Completely Able ?Feed Self Completely Able ?Walk Completely Able ?Get In / Out Bed Completely Able ?Housework Completely Able ?Prepare Meals Completely Able ?Handle Money Completely Able ?Shop for Self Completely Able ?Electronic Signature(s) ?Signed: 10/19/2021 7:19:37 PM By: [name] Fernandez ?Entered By:  [name] Fernandez on 10/19/2021 08:12:44 ?-------------------------------------------------------------------------------- ?Education Screening Details ?Patient Name: ?Date of Service: ?[name] Fernandez, [name] [name] Fernandez. 10/19/2021 8:00 A M ?Medical Record Number: [id number] ?Patient Account Number: [id number] ?Date of Birth/Sex: ?Treating RN: ?[birth year] Fernandez (81 y.o. [name] Fernandez ?Primary Care [name] Fernandez: [name] Fernandez ?Other Clinician: ?Referring [name] Fernandez: ?Treating [name] Fernandez/Extender: [name] Fernandez ?[name] Fernandez ?Weeks in Treatment: 0 ?Primary Learner Assessed: Patient ?Learning Preferences/Education Level/Primary Language ?Learning Preference: Explanation, Demonstration, Printed Material ?Highest Education Level: High School ?Preferred Language: English ?Cognitive Barrier ?Language Barrier: No ?Translator Needed: No ?Memory Deficit: No ?Emotional Barrier: No ?Cultural/Religious Beliefs Affecting Medical Care: No ?Physical Barrier ?Impaired Vision: Yes Glasses ?Impaired Hearing: No ?Decreased Hand dexterity: No ?Knowledge/Comprehension ?Knowledge Level: High ?Comprehension Level: High ?Ability to understand written instructions: High ?Ability to understand verbal instructions: High ?Motivation ?Anxiety Level: Calm ?Cooperation: Cooperative ?Education Importance: Acknowledges Need ?Interest in Health Problems: Asks Questions ?Perception: Coherent ?Willingness to Engage in Self-Management High ?Activities: ?Readiness to Engage in Self-Management High ?Activities: ?Electronic Signature(s) ?Signed: 10/19/2021 7:19:37 PM By: [name] Fernandez ?Entered By: [name] Fernandez on 10/19/2021 08:13:20 ?-------------------------------------------------------------------------------- ?Fall Risk Assessment Details ?Patient Name: ?Date of Service: ?[name] Fernandez, [name] [name] Fernandez. 10/19/2021 8:00 A M ?Medical Record Number: [id number] ?Patient Account Number: [id number] ?Date of Birth/Sex: ?Treating RN: ?[name] Fernandez (81 y.o. [name] Fernandez ?Primary Care  [name] Fernandez: [name] Fernandez ?Other Clinician: ?Referring [name] Fernandez: ?Treating [name] Fernandez/Extender: [name] Fernandez ?[name] Fernandez ?Weeks in Treatment: 0 ?Fall Risk Assessment Items ?Have you had 2 or more falls in the last 12 monthso 0 No ?Have you had any fall that resulted in injury in the last 12 monthso 0 No ?FALLS RISK SCREEN ?History of falling - immediate or within 3 months 0 No ?Secondary diagnosis (Do you have 2 or more medical diagnoseso) 0 No ?Ambulatory aid ?None/bed rest/wheelchair/nurse 0 Yes ?Crutches/cane/walker 0 No ?Furniture 0 No ?Intravenous therapy  Access/Saline/Heparin Lock 0 No ?Gait/Transferring ?Normal/ bed rest/ wheelchair 0 Yes ?Weak (short steps with or without shuffle, stooped but able to lift head while walking, may seek 0 No ?support from furniture) ?Impaired (short steps with shuffle, may have difficulty arising from chair, head down, impaired 0 No ?balance) ?Mental Status ?Oriented to own ability 0 Yes ?Electronic Signature(s) ?Signed: 10/19/2021 7:19:37 PM By: [name] Fernandez ?Entered By: [name] Fernandez on 10/19/2021 08:13:31 ?-------------------------------------------------------------------------------- ?Foot Assessment Details ?Patient Name: ?Date of Service: ?[name] Fernandez, [name] [name] Fernandez. 10/19/2021 8:00 A M ?Medical Record Number: [id number] ?Patient Account Number: [id number] ?Date of Birth/Sex: ?Treating RN: ?[name] Fernandez (81 y.o. [name] Fernandez ?Primary Care [name] Fernandez: [name] Fernandez ?Other Clinician: ?Referring [name] Fernandez: ?Treating [name] Fernandez/Extender: [name] Fernandez ?[name] Fernandez ?Weeks in Treatment: 0 ?Foot Assessment Items ?Site Locations ?+ = Sensation present, - = Sensation absent, Fernandez = Callus, U = Ulcer ?R = Redness, W = Warmth, M = Maceration, PU = Pre-ulcerative lesion ?F = Fissure, S = Swelling, Fernandez = Dryness ?Assessment ?Right: Left: ?Other Deformity: No No ?Prior Foot Ulcer: No No ?Prior Amputation: No No ?Charcot Joint: No No ?Ambulatory Status: Ambulatory Without  Help ?Gait: Steady ?Electronic Signature(s) ?Signed: 10/19/2021 7:19:37 PM By: [name] Fernandez ?Entered By: [name] Fernandez on 10/19/2021 08:17:38 ?-------------------------------------------------------------------------------- ?Nutrition Risk Screening Details ?Patient Name: ?Date of Service: ?[name] Fernandez, [name] [name] Fernandez. 10/19/2021 8:00 A M ?Medical Record Number: [id number] ?Patient Account Number: [id number] ?Date of Birth/Sex: ?Treating RN: ?[birth year] Fernandez (81 y.o. [name] Fernandez ?Primary Care Anjolina Byrer: [name] Fernandez ?Other Clinician: ?Referring Reginald Weida: ?Treating Josalyn Dettmann/Extender: [name] Fernandez ?[name] Fernandez ?Weeks in Treatment: 0 ?Height (in): 66 ?Weight (lbs): 184 ?Body Mass Index (BMI): 29.7 ?Nutrition Risk Screening Items ?Score Screening ?NUTRITION RISK SCREEN: ?I have an illness or condition that made me change the kind and/or amount of food I eat 0 No ?I eat fewer than two meals per day 0 No ?I eat few fruits and vegetables, or milk products 0 No ?I have three or more drinks of beer, liquor or wine almost every day 0 No ?I have tooth or mouth problems that make it hard for me to eat 0 No ?I don't always have enough money to buy the food I need 0 No ?I eat alone most of the time 0 No ?I take three or more different prescribed or over-the-counter drugs a day 1 Yes ?Without wanting to, I have lost or gained 10 pounds in the last six months 0 No ?I am not always physically able to shop, cook and/or feed myself 0 No ?Nutrition Protocols ?Good Risk Protocol 0 No interventions needed ?Moderate Risk Protocol ?High Risk Proctocol ?Risk Level: Good Risk ?Score: 1 ?Electronic Signature(s) ?Signed: 10/19/2021 7:19:37 PM By: [name] Fernandez ?Entered By: [name] Fernandez on 10/19/2021 08:14:35 ?

## 2021-10-20 ENCOUNTER — Encounter: Payer: Self-pay | Admitting: Internal Medicine

## 2021-10-20 ENCOUNTER — Ambulatory Visit (HOSPITAL_COMMUNITY)
Admission: RE | Admit: 2021-10-20 | Discharge: 2021-10-20 | Disposition: A | Discharge status: Home / Self Care | Payer: Medicare HMO | Source: Ambulatory Visit | Attending: Physician Assistant | Admitting: Physician Assistant

## 2021-10-20 DIAGNOSIS — R52 Pain, unspecified: Secondary | ICD-10-CM | POA: Insufficient documentation

## 2021-10-21 ENCOUNTER — Encounter (HOSPITAL_BASED_OUTPATIENT_CLINIC_OR_DEPARTMENT_OTHER): Payer: PRIVATE HEALTH INSURANCE | Admitting: Internal Medicine

## 2021-10-24 ENCOUNTER — Other Ambulatory Visit: Payer: Self-pay | Admitting: Internal Medicine

## 2021-10-24 ENCOUNTER — Other Ambulatory Visit: Payer: Self-pay | Admitting: Nurse Practitioner

## 2021-10-24 DIAGNOSIS — G894 Chronic pain syndrome: Secondary | ICD-10-CM

## 2021-10-24 DIAGNOSIS — L309 Dermatitis, unspecified: Secondary | ICD-10-CM

## 2021-10-24 MED ORDER — GABAPENTIN 600 MG PO TABS: ORAL_TABLET | ORAL | 1 refills | Status: DC

## 2021-10-25 DIAGNOSIS — I83015 Varicose veins of right lower extremity with ulcer other part of foot: Secondary | ICD-10-CM | POA: Diagnosis not present

## 2021-10-25 DIAGNOSIS — I83025 Varicose veins of left lower extremity with ulcer other part of foot: Secondary | ICD-10-CM | POA: Diagnosis not present

## 2021-10-25 DIAGNOSIS — L97522 Non-pressure chronic ulcer of other part of left foot with fat layer exposed: Secondary | ICD-10-CM | POA: Diagnosis not present

## 2021-10-25 DIAGNOSIS — L97512 Non-pressure chronic ulcer of other part of right foot with fat layer exposed: Secondary | ICD-10-CM | POA: Diagnosis not present

## 2021-10-26 ENCOUNTER — Other Ambulatory Visit: Payer: Self-pay

## 2021-10-26 ENCOUNTER — Encounter (HOSPITAL_BASED_OUTPATIENT_CLINIC_OR_DEPARTMENT_OTHER): Payer: Medicare HMO | Admitting: Physician Assistant

## 2021-10-26 DIAGNOSIS — L97512 Non-pressure chronic ulcer of other part of right foot with fat layer exposed: Secondary | ICD-10-CM | POA: Diagnosis not present

## 2021-10-26 DIAGNOSIS — E11622 Type 2 diabetes mellitus with other skin ulcer: Secondary | ICD-10-CM | POA: Diagnosis not present

## 2021-10-26 DIAGNOSIS — E11621 Type 2 diabetes mellitus with foot ulcer: Secondary | ICD-10-CM | POA: Diagnosis not present

## 2021-10-26 DIAGNOSIS — I10 Essential (primary) hypertension: Secondary | ICD-10-CM | POA: Diagnosis not present

## 2021-10-26 DIAGNOSIS — M5430 Sciatica, unspecified side: Secondary | ICD-10-CM | POA: Diagnosis not present

## 2021-10-26 DIAGNOSIS — G8929 Other chronic pain: Secondary | ICD-10-CM | POA: Diagnosis not present

## 2021-10-26 DIAGNOSIS — E785 Hyperlipidemia, unspecified: Secondary | ICD-10-CM | POA: Diagnosis not present

## 2021-10-26 DIAGNOSIS — L958 Other vasculitis limited to the skin: Secondary | ICD-10-CM | POA: Diagnosis not present

## 2021-10-26 DIAGNOSIS — L97522 Non-pressure chronic ulcer of other part of left foot with fat layer exposed: Secondary | ICD-10-CM | POA: Diagnosis not present

## 2021-10-26 DIAGNOSIS — I87333 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 extremity: Secondary | ICD-10-CM | POA: Diagnosis not present

## 2021-10-26 DIAGNOSIS — L97529 Non-pressure chronic ulcer of other part of left foot with unspecified severity: Secondary | ICD-10-CM | POA: Diagnosis not present

## 2021-10-26 NOTE — Progress Notes (Addendum)
SAURAV, CRUMBLE ([id number]) ?Visit Report for 10/26/2021 ?Biopsy Details ?Patient Name: Date of Service: ?Bumbaugh, RO [name] 10/26/2021 8:45 A M ?Medical Record Number: [id number] ?Patient Account Number: [id number] ?Date of Birth/Sex: Treating RN: ?07/13/1940 (81 y.o. [name] ?Primary Care Provider: [name] Other Clinician: ?Referring Provider: ?Treating Provider/Extender: [name] ?[name] ?Weeks in Treatment: 1 ?Biopsy Performed for: Wound #6 Left, Lateral Foot ?Location(s): Wound Bed ?Performed By: Physician [name], PA ?Tissue Punch: Yes ?Size (mm): 5 ?Number of Specimens T aken: 1 ?Specimen Sent T Pathology: ?o Yes ?Level of Consciousness (Pre-procedure): Awake and Alert ?Pre-procedure Verification/Time-Out Taken: Yes - 09:15 ?Pain Control: Lidocaine Injectable ?Lidocaine Percent: 1% ?Instrument: Forceps ?Bleeding: Minimum ?Hemostasis Achieved: Pressure ?Level of Consciousness (Post-procedure): Awake and Alert ?Post Procedure Diagnosis ?Same as Pre-procedure ?Electronic Signature(s) ?Signed: 10/26/2021 4:18:49 PM By: [name] PA-C ?Signed: 10/26/2021 4:30:40 PM By: [name] ?Entered By: [name] on 10/26/2021 09:20:32 ?-------------------------------------------------------------------------------- ?Chief Complaint Document Details ?Patient Name: Date of Service: ?Insco, RO [name] 10/26/2021 8:45 A M ?Medical Record Number: [id number] ?Patient Account Number: [id number] ?Date of Birth/Sex: Treating RN: ?Oct 20, 1940 (81 y.o. [name] ?Primary Care Provider: [name] Other Clinician: ?Referring Provider: ?Treating Provider/Extender: [name] ?[name] ?Weeks in Treatment: 1 ?Information Obtained from: Patient ?Chief Complaint ?Bilateral foot ulcers ?Electronic Signature(s) ?Signed: 10/26/2021 9:03:18 AM By: [name] PA-C ?Entered By: [name] on 10/26/2021  09:03:17 ?-------------------------------------------------------------------------------- ?HPI Details ?Patient Name: Date of Service: ?Bertelson, RO [name] 10/26/2021 8:45 A M ?Medical Record Number: [id number] ?Patient Account Number: [id number] ?Date of Birth/Sex: Treating RN: ?04-10-1941 (81 y.o. [name] ?Primary Care Provider: [name] Other Clinician: ?Referring Provider: ?Treating Provider/Extender: [name] ?[name] ?Weeks in Treatment: 1 ?History of Present Illness ?HPI Description: 81 year old gentleman known to have swelling of his left lower extremity with some ulceration along the medial ankle has been having these ?problems for the last 3-4 months and does not know how it came on. No history of injury or no history of any infection in this area. He is known to have ?diabetes mellitus controlled with diet, hyperlipidemia, hypertension and chronic lumbar back pain with sciatica which is being treated by his PCP. Last ?hemoglobin A1c was 6.3 in June 2017. Last medical history is significant for esophageal stricture, hiatal hernia, vitamin D deficiency, status post back surgery ?3, hernia repair as a child for both groins, hiatal hernia repair, joint replacement and revision of a knee surgery on the right side. He has quit smoking in 1982. ?He has never had a Doppler study of his lower extremity except remotely when he had knee surgery they looked for a blood clot on the right side. ?09/13/2016 -- venous reflux study done on 09/12/2016 showed there is evidence of greater saphenous vein reflux in the left lower extremity and the small ?saphenous vein and the posterior thigh is not competent. 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 With these results he definitely needs a ?referral to the vascular surgeons ?09/20/2016 -- he has an appointment to see the vascular surgeons on May 1 ?10/03/2016 -- patient had a 4 layer compression on his left lower  extremity and had a lot of discomfort and pain. ?10/11/2016-- was seen by 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 He did examine his right leg with the SonoSite and he has dilatation of the saphenous vein on the right lower extremity too. Since ?there was no evidence of ulceration he recommended observation of the right lower extremity. He recommended to continue with local wound care at the wound ?center until his wound was completely healed. ?10/18/2016 -- the patient has not been very compliant with his compression, his diet and his diuretics and as a result his lymphedema has increased markedly ?10/25/2016 -- he has been compliant this week, has worn his 2 press compression wraps, taken his diuretics and is watching his salt intake. ?11/08/2016 -- he had his venous procedure done last week and details of this have been reported and reviewed in his electronic medical record. he had a laser ?ablation of his great saphenous vein and this was done from mid calf to just below the saphenofemoral junction. He would return next week for ultrasound ?follow-up. He will then undergo the small saphenous vein ablation in a few weeks. ?11/15/2016 -- his postprocedure visit showed good closure of his left great saphenous vein from the mid calf to 1-1/2 cm from the saphenofemoral junction. ?He had excellent early results from the ablation. The ablation of the left small saphenous vein was planned in a week ?12/06/2016 -- he had his small saphenous vein venous ablation a week before and the duplex showed closure of his small saphenous vein to within half ?centimeter office saphenous popliteal junction with no DVT and he was pleased with the results. He recommended continue with elevation and compression and ?to see them back on an as-needed basis ?12/19/16; the patient's wound is actually closed. Sometime  over the weekend he developed a small abrasion on the lower calf just above the original wound on ?the left medial malleolus although this is closed as well ?Readmission: ?01/18/18 on evaluation today patient presents after having last been seen in our clinic July 2018. Subsequently he is a reoccurrence of the ulcers on the left ?ankle both medial and now a new area lateral that had been present back room for about a month he tells me. He states that this has done very well for about ?a year he really does not know of any injury or anything that happened that would have caused this reopening at this point. He has continued to wear ?compression stockings which is appropriate. He does not have lymphedema pumps. He has continued to also keep the area clean and dry as best he could. ?With that being said now that is been reopened is been harder for him to take care of this as far as compression stockings are concerned keeping them clean ?along with continuing to manage his fluid and swelling. His ABI appears to be great on the left registering at 1.14 today. He has previously undergone a venous ?ablation. Other than compression/lymphedema pumps he's really done everything that he can to help manage and prevent these ulcers from forming. 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 ?01/30/18 on evaluation today patient appears to actually be doing very well in regard to his medial ankle ulcer. It's the lateral ulcer that actually is not doing quite ?as well today. Fortunately he did have some alginate left ovary start using this on the wounds and the medial ankle actually seems to be doing much better. ?The lateral ankle does actually need something to con a help with slough and buildup. The collagen really did not seem to do much for him in that regard. ?  02/13/18 on evaluation today patient appears to be doing about the same in regard to his ulcers. Unfortunately I do not feel like were making good progress at ?this time. When I have  debrided the wound the slough seems to come back fairly quickly in the lateral ankle ulcer. There does not appear to be any evidence ?of infection at this time. No fevers, chills, nausea, or vomiting noted at this time. ?02/27/18 on evaluation today patient actuall

## 2021-10-26 NOTE — Progress Notes (Signed)
[name] Fernandez, [name] Fernandez ([id number]) ?Visit Report for 10/26/2021 ?Arrival Information Details ?Patient Name: Date of Service: ?[name] Fernandez, [name] [name] 10/26/2021 8:45 A M ?Medical Record Number: [id number] ?Patient Account Number: [id number] ?Date of Birth/Sex: Treating RN: ?[birth year] (81 y.o. [name] Fernandez ?Primary Care [name] Fernandez: [name] Fernandez Other Clinician: ?Referring [name] Fernandez: ?Treating [name] Fernandez/Extender: [name] Fernandez ?[name] Fernandez ?Weeks in Treatment: 1 ?Visit Information History Since Last Visit ?Added or deleted any medications: No ?Patient Arrived: [name] Fernandez ?Any new allergies or adverse reactions: No ?Arrival Time: 08:37 ?Had a fall or experienced change in No ?Transfer Assistance: None ?activities of daily living that may affect ?Patient Identification Verified: Yes ?risk of falls: ?Secondary Verification Process Completed: Yes ?Signs or symptoms of abuse/neglect since last visito No ?Patient Requires Transmission-Based Precautions: No ?Hospitalized since last visit: No ?Patient Has Alerts: No ?Implantable device outside of the clinic excluding No ?cellular tissue based products placed in the center ?since last visit: ?Has Dressing in Place as Prescribed: Yes ?Pain Present Now: Yes ?Electronic Signature(s) ?Signed: 10/26/2021 4:30:40 PM By: [name] Fernandez ?Entered By: [name] Fernandez on 10/26/2021 08:40:38 ?-------------------------------------------------------------------------------- ?Encounter Discharge Information Details ?Patient Name: Date of Service: ?[name] Fernandez, [name] [name] 10/26/2021 8:45 A M ?Medical Record Number: [id number] ?Patient Account Number: [id number] ?Date of Birth/Sex: Treating RN: ?[birth year]ern[birth year] (81 y.o. [name] Fernandez ?Primary Care [name] Fernandez: [name] Fernandez Other Clinician: ?Referring [name] Fernandez: ?Treating [name] Fernandez/Extender: [name] Fernandez ?[name] Fernandez ?Weeks in Treatment: 1 ?Encounter Discharge Information Items Post Procedure Vitals ?Discharge Condition: Stable ?Temperature (F):  98.1 ?Ambulatory Status: [name] Fernandez ?Pulse (bpm): 69 ?Discharge Destination: Home ?Respiratory Rate (breaths/min): 18 ?Transportation: Private Auto ?Blood Pressure (mmHg): 147/76 ?Schedule Follow-up Appointment: Yes ?Clinical Summary of Care: Provided on 10/26/2021 ?Form Type Recipient ?Paper Patient Patient ?Electronic Signature(s) ?Signed: 10/26/2021 4:30:40 PM By: [name] Fernandez ?Entered By: [name] Fernandez on 10/26/2021 09:53:57 ?-------------------------------------------------------------------------------- ?Lower Extremity Assessment Details ?Patient Name: ?Date of Service: ?[name] Fernandez, [name] [name] 10/26/2021 8:45 A M ?Medical Record Number: [id number] ?Patient Account Number: [id number] ?Date of Birth/Sex: ?Treating RN: ?Fernandez[birth year] (81 y.o. [name] Fernandez ?Primary Care [name] Fernandez: [name] Fernandez ?Other Clinician: ?Referring [name] Fernandez: ?Treating [name] Fernandez/Extender: [name] Fernandez ?[name] Fernandez ?Weeks in Treatment: 1 ?Edema Assessment ?Assessed: [Left: Yes] [Right: Yes] ?Edema: [Left: Yes] [Right: Yes] ?Calf ?Left: Right: ?Point of Measurement: 32 cm From Medial Instep 40 cm 44.4 cm ?Ankle ?Left: Right: ?Point of Measurement: 10 cm From Medial Instep 26.4 cm 28.7 cm ?Knee To Floor ?Left: Right: ?From Medial Instep 37 cm 37 cm ?Vascular Assessment ?Pulses: ?Dorsalis Pedis ?Palpable: [Left:Yes] [Right:Yes] ?Electronic Signature(s) ?Signed: 10/26/2021 4:30:40 PM By: [name] Fernandez ?Entered By: [name] Fernandez on 10/26/2021 08:54:24 ?-------------------------------------------------------------------------------- ?Multi-Disciplinary Care Plan Details ?Patient Name: ?Date of Service: ?[name] Fernandez, [name] [name] 10/26/2021 8:45 A M ?Medical Record Number: [id number] ?Patient Account Number: [id number] ?Date of Birth/Sex: ?Treating RN: ?[birth year] Fern[birth year] (81 y.o. 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 ?Compression as ordered ?Notes: ?Wound/Skin Impairment ?Nursing Diagnoses: ?Impaired tissue integrity ?Goals: ?Patient/caregiver will verbalize understanding of skin care regimen ?Date Initiated: 10/19/2021 ?Target Resolution Date: 11/16/2021 ?Goal Status: Active ?Ulcer/skin breakdown will have a volume reduction of 30% by week 4 ?Date Initiated: 10/19/2021 ?  Target Resolution Date: 11/16/2021 ?Goal Status: Active ?Interventions: ?Assess patient/caregiver ability to obtain necessary supplies ?Assess patient/caregiver ability to perform ulcer/skin care regimen upon admission and as needed ?Assess ulceration(s) every visit ?Provide education on ulcer and skin care ?Treatment Activities: ?Topical wound management initiated : 10/19/2021 ?Notes: ?Electronic Signature(s) ?Signed: 10/26/2021 4:30:40 PM By: [name] Fernandez ?Entered By: [name] Fernandez on 10/26/2021 08:36:32 ?-------------------------------------------------------------------------------- ?Pain Assessment Details ?Patient Name: ?Date of Service: ?[name] Fernandez, [name] [name] 10/26/2021 8:45 A M ?Medical Record Number: [id number] ?Patient Account Number: [id number] ?Date of Birth/Sex: ?Treating RN: ?[name] Fernandez (81 y.o. [name] Fernandez ?Primary Care [name] Fernandez: [name] Fernandez ?Other Clinician: ?Referring Kendrea Cerritos: ?Treating Tanasha Menees/Extender: [name] Fernandez ?[name] Fernandez ?Weeks in Treatment: 1 ?Active Problems ?Location of Pain Severity and Description of Pain ?Patient Has Paino Yes ?Site Locations ?Pain Location: ?Pain in Ulcers ?With Dressing Change: Yes ?Duration of the Pain. ?Constant / Intermittento Intermittent ?Rate the pain. ?Current Pain Level:  5 ?Character of Pain ?Describe the Pain: Burning, Throbbing ?Pain Management and Medication ?Current Pain Management: ?Medication: Yes ?Cold Application: No ?Rest: Yes ?Massage: No ?Activity: No ?T.E.N.S.: No ?Heat Application: No ?Leg drop or elevation: No ?Is the Current Pain Management Adequate: Adequate ?How does your wound impact your activities of daily livingo ?Sleep: No ?Bathing: No ?Appetite: No ?Relationship With Others: No ?Bladder Continence: No ?Emotions: No ?Bowel Continence: No ?Work: No ?Toileting: No ?Drive: No ?Dressing: No ?Hobbies: No ?Electronic Signature(s) ?Signed: 10/26/2021 4:30:40 PM By: [name] Fernandez ?Entered By: [name] Fernandez on 10/26/2021 08:44:23 ?-------------------------------------------------------------------------------- ?Patient/Caregiver Education Details ?Patient Name: ?Date of Service: ?[name] Fernandez, [name] [name] 5/17/2023andnbsp8:45 A M ?Medical Record Number: [id number] ?Patient Account Number: [id number] ?Date of Birth/Gender: ?Treating RN: ?Fernandez[birth year] (81 y.o. [name] Fernandez ?Primary Care Physician: [name] Fernandez ?Other Clinician: ?Referring Physician: ?Treating Physician/Extender: [name] Fernandez ?[name] Fernandez ?Weeks in Treatment: 1 ?Education Assessment ?Education Provided To: ?Patient ?Education Topics Provided ?Venous: ?Methods: Explain/Verbal, Printed ?Responses: State content correctly ?Wound/Skin Impairment: ?Methods: Explain/Verbal, Printed ?Responses: State content correctly ?Electronic Signature(s) ?Signed: 10/26/2021 4:30:40 PM By: [name] Fernandez ?Entered By: [name] Fernandez on 10/26/2021 08:37:35 ?-------------------------------------------------------------------------------- ?Wound Assessment Details ?Patient Name: ?Date of Service: ?[name] Fernandez, [name] [name] 10/26/2021 8:45 A M ?Medical Record Number: [id number] ?Patient Account Number: [id number] ?Date of Birth/Sex: ?Treating RN: ?[birth year] (81 y.o. [name] Fernandez ?Primary Care [name]: [name] Fernandez ?Other Clinician: ?Referring Sebastien Fernandez: ?Treating Hetvi Shawhan/Extender: [name] Fernandez ?[name] Fernandez ?Weeks in Treatment: 1 ?Wound Status ?Wound Number: 6 Primary Vasculitis ?Etiology: ?Wound Location: Left, Lateral

## 2021-11-02 ENCOUNTER — Encounter (HOSPITAL_BASED_OUTPATIENT_CLINIC_OR_DEPARTMENT_OTHER): Payer: Medicare HMO | Admitting: Physician Assistant

## 2021-11-02 DIAGNOSIS — L080 Pyoderma: Secondary | ICD-10-CM | POA: Diagnosis not present

## 2021-11-02 DIAGNOSIS — G8929 Other chronic pain: Secondary | ICD-10-CM | POA: Diagnosis not present

## 2021-11-02 DIAGNOSIS — M5430 Sciatica, unspecified side: Secondary | ICD-10-CM | POA: Diagnosis not present

## 2021-11-02 DIAGNOSIS — I890 Lymphedema, not elsewhere classified: Secondary | ICD-10-CM | POA: Diagnosis not present

## 2021-11-02 DIAGNOSIS — L97522 Non-pressure chronic ulcer of other part of left foot with fat layer exposed: Secondary | ICD-10-CM | POA: Diagnosis not present

## 2021-11-02 DIAGNOSIS — B952 Enterococcus as the cause of diseases classified elsewhere: Secondary | ICD-10-CM | POA: Diagnosis not present

## 2021-11-02 DIAGNOSIS — B965 Pseudomonas (aeruginosa) (mallei) (pseudomallei) as the cause of diseases classified elsewhere: Secondary | ICD-10-CM | POA: Diagnosis not present

## 2021-11-02 DIAGNOSIS — I87333 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 extremity: Secondary | ICD-10-CM | POA: Diagnosis not present

## 2021-11-02 DIAGNOSIS — E785 Hyperlipidemia, unspecified: Secondary | ICD-10-CM | POA: Diagnosis not present

## 2021-11-02 DIAGNOSIS — E11622 Type 2 diabetes mellitus with other skin ulcer: Secondary | ICD-10-CM | POA: Diagnosis not present

## 2021-11-02 DIAGNOSIS — L97512 Non-pressure chronic ulcer of other part of right foot with fat layer exposed: Secondary | ICD-10-CM | POA: Diagnosis not present

## 2021-11-02 DIAGNOSIS — I10 Essential (primary) hypertension: Secondary | ICD-10-CM | POA: Diagnosis not present

## 2021-11-02 DIAGNOSIS — E11621 Type 2 diabetes mellitus with foot ulcer: Secondary | ICD-10-CM | POA: Diagnosis not present

## 2021-11-02 DIAGNOSIS — L958 Other vasculitis limited to the skin: Secondary | ICD-10-CM | POA: Diagnosis not present

## 2021-11-02 NOTE — Progress Notes (Addendum)
KVION, SHAPLEY ([id number]) Visit Report for 11/02/2021 Chief Complaint Document Details Patient Name: Date of Service: [place], [name] 11/02/2021 9:30 A M Medical Record Number: [id number] Patient Account Number: [id number] Date of Birth/Sex: Treating RN: 09/07/1940 (81 y.o. [name] Fernandez Primary Care Provider: [name] Other Clinician: Referring Provider: Treating Provider/Extender: Azucena Kuba in Treatment: 2 Information Obtained from: Patient Chief Complaint Bilateral foot ulcers Electronic Signature(s) Signed: 11/02/2021 10:03:42 AM By: [name] PA-C Entered By: [name] on 11/02/2021 10:03:42 -------------------------------------------------------------------------------- Debridement Details Patient Name: Date of Service: [place], [name] 11/02/2021 9:30 A M Medical Record Number: [id number] Patient Account Number: [id number] Date of Birth/Sex: Treating RN: 1940-09-07 (81 y.o. [name] Fernandez Primary Care Provider: [name] Other Clinician: Referring Provider: Treating Provider/Extender: Azucena Kuba in Treatment: 2 Debridement Performed for Assessment: Wound #6 Left,Lateral Foot Performed By: Physician [name], PA Debridement Type: Chemical/Enzymatic/Mechanical Agent Used: Santyl Level of Consciousness (Pre-procedure): Awake and Alert Pre-procedure Verification/Time Out Yes - 10:09 Taken: Start Time: 10:10 Pain Control: Lidocaine 5% topical ointment Bleeding: None End Time: 10:12 Response to Treatment: Procedure was tolerated well Level of Consciousness (Post- Awake and Alert procedure): Post Debridement Measurements of Total Wound Length: (cm) 6.4 Width: (cm) 6.2 Depth: (cm) 0.2 Volume: (cm) 6.233 Character of Wound/Ulcer Post Debridement: Stable Post Procedure Diagnosis Same as Pre-procedure Electronic Signature(s) Signed: 11/02/2021 3:58:49 PM By:  [name] Signed: 11/02/2021 5:33:59 PM By: [name] PA-C Signed: 11/02/2021 5:33:59 PM By: [name] PA-C Entered By: [name] on 11/02/2021 10:14:17 -------------------------------------------------------------------------------- Debridement Details Patient Name: Date of Service: [place], [name] 11/02/2021 9:30 A M Medical Record Number: [id number] Patient Account Number: [id number] Date of Birth/Sex: Treating RN: 04/09/1941 (81 y.o. [name] Fernandez Primary Care Provider: [name] Other Clinician: Referring Provider: Treating Provider/Extender: Azucena Kuba in Treatment: 2 Debridement Performed for Assessment: Wound #7 Right,Lateral Foot Performed By: Physician [name], PA Debridement Type: Chemical/Enzymatic/Mechanical Agent Used: Santyl Level of Consciousness (Pre-procedure): Awake and Alert Pre-procedure Verification/Time Out Yes - 10:09 Taken: Start Time: 10:10 Pain Control: Lidocaine 5% topical ointment Bleeding: None End Time: 10:12 Response to Treatment: Procedure was tolerated well Level of Consciousness (Post- Awake and Alert procedure): Post Debridement Measurements of Total Wound Length: (cm) 0.3 Width: (cm) 0.2 Depth: (cm) 0.2 Volume: (cm) 0.009 Character of Wound/Ulcer Post Debridement: Stable Post Procedure Diagnosis Same as Pre-procedure Electronic Signature(s) Signed: 11/02/2021 3:58:49 PM By: [name] Signed: 11/02/2021 5:33:59 PM By: [name] PA-C Entered By: [name] on 11/02/2021 10:25:08 -------------------------------------------------------------------------------- HPI Details Patient Name: Date of Service: [name] Fernandez, [name] 11/02/2021 9:30 A M Medical Record Number: [id number] Patient Account Number: [id number] Date of Birth/Sex: Treating RN: [birth year] (81 y.o. [name] Fernandez Primary Care Provider: [name] Other Clinician: Referring  Provider: Treating Provider/Extender: Azucena Kuba in Treatment: 2 History of Present Illness HPI Description: 81 year old gentleman known to have swelling of his left lower extremity with some ulceration along the medial ankle has been having these problems for the last 3-4 months and does not know how it came on. No history of injury or no history of any infection in this area. He is known to have diabetes mellitus controlled with diet, hyperlipidemia, hypertension and chronic lumbar back pain with sciatica which is being treated by his PCP. Last  hemoglobin A1c was 6.3 in June 2017. Last medical history is significant for esophageal stricture, hiatal hernia, vitamin D deficiency, status post back surgery 3, hernia repair as a child for both groins, hiatal hernia repair, joint replacement and revision of a knee surgery on the right side. He has quit smoking in 1982. He has never had a Doppler study of his lower extremity except remotely when he had knee surgery they looked for a blood clot on the right side. 09/13/2016 -- venous reflux study done on 09/12/2016 showed there is evidence of greater saphenous vein reflux in the left lower extremity and the small saphenous vein and the posterior thigh is not competent. 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 With these results he definitely needs a referral to the vascular surgeons 09/20/2016 -- he has an appointment to see the vascular surgeons on May 1 10/03/2016 -- patient had a 4 layer compression on his left lower extremity and had a lot of discomfort and pain. 10/11/2016-- was seen by Dr. Althea [name] -- review of his data he recommended staged laser ablation of his great and then small saphenous veins for reduction odd of his venous hypertension. He did examine his right leg with the SonoSite and he has dilatation of the saphenous vein on the right lower extremity too. Since there was no evidence of  ulceration he recommended observation of the right lower extremity. He recommended to continue with local wound care at the wound center until his wound was completely healed. 10/18/2016 -- the patient has not been very compliant with his compression, his diet and his diuretics and as a result his lymphedema has increased markedly 10/25/2016 -- he has been compliant this week, has worn his 2 press compression wraps, taken his diuretics and is watching his salt intake. 11/08/2016 -- he had his venous procedure done last week and details of this have been reported and reviewed in his electronic medical record. he had a laser ablation of his great saphenous vein and this was done from mid calf to just below the saphenofemoral junction. He would return next week for ultrasound follow-up. He will then undergo the small saphenous vein ablation in a few weeks. 11/15/2016 -- his postprocedure visit showed good closure of his left great saphenous vein from the mid calf to 1-1/2 cm from the saphenofemoral junction. He had excellent early results from the ablation. The ablation of the left small saphenous vein was planned in a week 12/06/2016 -- he had his small saphenous vein venous ablation a week before and the duplex showed closure of his small saphenous vein to within half centimeter office saphenous popliteal junction with no DVT and he was pleased with the results. He recommended continue with elevation and compression and to see them back on an as-needed basis 12/19/16; the patient's wound is actually closed. Sometime over the weekend he developed a small abrasion on the lower calf just above the original wound on the left medial malleolus although this is closed as well Readmission: 01/18/18 on evaluation today patient presents after having last been seen in our clinic July 2018. Subsequently he is a reoccurrence of the ulcers on the left ankle both medial and now a new area lateral that had been present  back room for about a month he tells me. He states that this has done very well for about a year he really does not know of any injury or anything that happened that would have caused this  reopening at this point. He has continued to wear compression stockings which is appropriate. He does not have lymphedema pumps. He has continued to also keep the area clean and dry as best he could. With that being said now that is been reopened is been harder for him to take care of this as far as compression stockings are concerned keeping them clean along with continuing to manage his fluid and swelling. His ABI appears to be great on the left registering at 1.14 today. He has previously undergone a venous ablation. Other than compression/lymphedema pumps he's really done everything that he can to help manage and prevent these ulcers from forming. 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 01/30/18 on evaluation today patient appears to actually be doing very well in regard to his medial ankle ulcer. It's the lateral ulcer that actually is not doing quite as well today. Fortunately he did have some alginate left ovary start using this on the wounds and the medial ankle actually seems to be doing much better. The lateral ankle does actually need something to con a help with slough and buildup. The collagen really did not seem to do much for him in that regard. 02/13/18 on evaluation today patient appears to be doing about the same in regard to his ulcers. Unfortunately I do not feel like were making good progress at this time. When I have debrided the wound the slough seems to come back fairly quickly in the lateral ankle ulcer. There does not appear to be any evidence of infection at this time. No fevers, chills, nausea, or vomiting noted at this time. 02/27/18 on evaluation today patient actually appears to be showing some signs of improvement in regard to both wound areas. I'm insurance on the medial aspect of his left lower  extremity at the ankle whether or not the alginate may be sticking and causing some tearing off of new skin attempting to grow. With that being said the patient states it does not seem to be doing such just pulls up some of the dried dead skin around. Nonetheless this is something I wanted to watch out for going forward and actually I recommended that he take the dressing off in the shower when you could get it completely wet before removal to see how this does. Subsequently in regard to the lateral ankle ulcer he still has slough noted at this point although I do believe that he is tolerating the Medihoney much better I wish you could have gotten the Santyl but unfortunately the Santyl was too expensive gonna cost him $250. 03/06/18 on evaluation today patient appears to be doing about the same in regard to both ulcers of his left medial and lateral malleolus sites. He's been tolerating the dressing changes without complication. Unfortunately things do not seem to be improving in regard to either side at this time. Overall I think we may need to switch things up a little bit as far as the way we are performing the dressing changes currently. 03/27/18 on evaluation today patient's wound bed actually appears to be doing much better at both locations in regard to his ankle ulcers. He has been tolerating the dressing changes at this time without complication. Overall I'm very pleased with how things appear. The patient likewise states that he's much better in regard to discomfort at this time. 04/10/18 on evaluation today patient actually appears to be doing well in regard to his left lateral ankle ulcer. He has been tolerating the dressing changes  without complication. Fortunately there does not appear to be any evidence of infection. Overall I do feel like he is tolerating the Medihoney without complication. 04/24/18 upon evaluation today patient's wound actually appears to be healed on the lateral  aspect of his ankle the medial aspect still continues to remain close without any evidence of complication at this time. Overall I have been extremely pleased with how things have progressed up to this point. The patient likewise is very happy he's not having any significant pain this time. He does wears compression stocking on a regular basis. Readmission: 10-19-2021 upon evaluation today patient presents for evaluation here in the clinic concerning issues with a left lateral ulcer as well as a right lateral ulcer both along the aspect of his foot getting close to the ankle. This left area is the same place that I treated back in 2024. 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 This appears to be very inflamed and is also very painful. In the past he has been completely unable to tolerate the use of any compression wraps unfortunately. There does not appear to be any evidence of active infection locally nor systemically at this time which is good news. I do believe however we need to try to see what we can do to calm down the inflammation I think triamcinolone topically as well as oral prednisone would likely be indicated in this case. Patient's medical history really has not changed significantly since I last saw him in actually 2019 though I think is stated 2020 above. 10-26-21 upon evaluation today patient appears to still be having a lot of irritation and inflammation in regard to the ankles laterally. The left is greater than the right. With that being said I do believe that he would benefit from a biopsy to confirm whether or not this may indeed be vasculitis. It actually seems like that physically and on examination but again I want to be sure that we are on the right track as far as treatment is concerned. The good news is he did not have a DVT I called him about that on Friday this was all in regard to the right leg. Overall I am very pleased in that  regard. 11-02-2021 upon evaluation today patient appears to be doing poorly still in regard to his left lower extremity. Apparently he had a fever on Sunday which was around 101 and he was severely sick feeling and disoriented according to his wife. She came with him today because she did not think that he would actually tell me what was going on. With that being said unfortunately he did not go to the hospital he actually has been doing a little better over the past few days he been taking amoxicillin he has not taken any other medications orally at this point that are different. He has not been on any antibiotics either more recently other than the amoxicillin. Nonetheless he has not had a repeat in the past 3 days of that issue. Electronic Signature(s) Signed: 11/02/2021 3:23:06 PM By: [name] PA-C Signed: 11/02/2021 3:23:06 PM By: [name] PA-C Entered By: [name] on 11/02/2021 15:23:05 -------------------------------------------------------------------------------- Physical Exam Details Patient Name: Date of Service: [place], [name] 11/02/2021 9:30 A M Medical Record Number: [id number] Patient Account Number: [id number] Date of Birth/Sex: Treating RN: [birth year] (81 y.o. [name] Fernandez Primary Care Provider: [name] Other Clinician: Referring Provider: Treating  Provider/Extender: Marcos Eke Weeks in Treatment: 2 Constitutional Well-nourished and well-hydrated in no acute distress. Respiratory normal breathing without difficulty. Psychiatric this patient is able to make decisions and demonstrates good insight into disease process. Alert and Oriented x 3. pleasant and cooperative. Notes Patient's wound is still showing significant erythema around the edges of the wound on the left as well as significant issues here with necrotic tissue. Again we tried debridement is extremely painful and we tried other topical medications  without good success. For that reason I do believe the [facility] Needs is going to be the way to go I Georgina Peer probably have to get this prior approved through his insurance we will see. In the meantime I do believe he probably needs an antibiotic switch and I Georgina Peer go and make that today. I am can also send in a PCR culture to evaluate for what organisms may be causing issues here. His biopsy came back without any signs of malignancy there was mainly inflammation and ulceration noted with evidence of stasis dermatitis. Electronic Signature(s) Signed: 11/02/2021 3:23:51 PM By: [name] PA-C Entered By: [name] on 11/02/2021 15:23:51 -------------------------------------------------------------------------------- Physician Orders Details Patient Name: Date of Service: [place], [name] 11/02/2021 9:30 A M Medical Record Number: [id number] Patient Account Number: [id number] Date of Birth/Sex: Treating RN: 03/15/1941 (81 y.o. 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 - 11/09/21 @ 8:45am with Bernette Redbird, Room 7) Return A Other: - -Prescription for antibiotic sent to your pharmacy, take as directed. -Prescription for Santyl sent to [facility] Bathing/ Shower/ Hygiene May shower and wash wound with soap and  water. - wash with antibacterial soap when changing dressing Edema Control - Lymphedema / SCD / Other Elevate legs to the level of the heart or above for 30 minutes daily and/or when sitting, a frequency of: - throughout the day Patient to wear own compression stockings every day. - Use stockings daily to right leg Moisturize legs daily. Additional Orders / Instructions Follow Nutritious Diet Wound Treatment Wound #6 - Foot Wound Laterality: Left, Lateral Cleanser: Soap and Water 1 x Per Day/30 Days Discharge Instructions: May shower and wash wound with dial antibacterial soap and water prior to dressing change. Prim Dressing: Santyl Ointment 1 x Per Day/30 Days ary Discharge Instructions: Apply nickel thick amount to wound bed as instructed Secondary Dressing: ALLEVYN Gentle Border, 5x5 (in/in) 1 x Per Day/30 Days Discharge Instructions: Apply over primary dressing as directed. Secondary Dressing: Woven Gauze Sponge, Non-Sterile 4x4 in 1 x Per Day/30 Days Discharge Instructions: Apply over primary dressing as directed. Compression Wrap: ACE Wrap 1 x Per Day/30 Days Wound #7 - Foot Wound Laterality: Right, Lateral Cleanser: Soap and Water 1 x Per Day/30 Days Discharge Instructions: May shower and wash wound with dial antibacterial soap and water prior to dressing change. Prim Dressing: Santyl Ointment 1 x Per Day/30 Days ary Discharge Instructions:  Apply nickel thick amount to wound bed as instructed Secondary Dressing: ALLEVYN Gentle Border, 5x5 (in/in) 1 x Per Day/30 Days Discharge Instructions: Apply over primary dressing as directed. Secondary Dressing: Woven Gauze Sponges 2x2 in 1 x Per Day/30 Days Discharge Instructions: Apply over primary dressing as directed. Laboratory erobe culture (MICRO) - PCR Culture sent to [facility] - (ICD10 L97.522 - Non-pressure chronic Bacteria identified in Unspecified specimen by A ulcer of other part of left foot with fat layer exposed) LOINC Code:  073-7 Convenience Name: Aerobic culture-specimen not specified Patient Medications llergies: latex, Feraheme, Naprosyn, levothyroxine A Notifications Medication Indication Start End 11/02/2021 Bactrim DS DOSE 1 - oral 800 mg-160 mg tablet - 1 tablet oral taken 2 times per day for 14 days 11/02/2021 Santyl DOSE topical 250 unit/gram ointment - ointment topical Apply nickel thick daily to the wound bed and then cover with a dressing as directed in clinic x 30 days Electronic Signature(s) Signed: 11/02/2021 10:40:02 AM By: [name] PA-C Entered By: [name] on 11/02/2021 10:40:01 -------------------------------------------------------------------------------- Problem List Details Patient Name: Date of Service: [place], [name] 11/02/2021 9:30 A M Medical Record Number: [id number] Patient Account Number: [id number] Date of Birth/Sex: Treating RN: Dec 04, 1940 (81 y.o. 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 10/19/2021 No Yes L97.522 Non-pressure chronic ulcer of other part of left foot with fat layer exposed 10/19/2021 No Yes L97.512 Non-pressure chronic ulcer of other part of right foot with fat layer exposed 10/19/2021 No Yes E11.622 Type 2 diabetes mellitus with other skin ulcer 10/19/2021 No Yes Inactive Problems Resolved Problems Electronic Signature(s) Signed: 11/02/2021 10:03:28 AM By: [name] PA-C Entered By: [name] on 11/02/2021  10:03:28 -------------------------------------------------------------------------------- Progress Note Details Patient Name: Date of Service: [place], [name] 11/02/2021 9:30 A M Medical Record Number: [id number] Patient Account Number: [id number] Date of Birth/Sex: Treating RN: 1941/02/01 (81 y.o. [name] Fernandez Primary Care Provider: [name] Other Clinician: Referring Provider: Treating Provider/Extender: Azucena Kuba in Treatment: 2 Subjective Chief Complaint Information obtained from Patient Bilateral foot ulcers History of Present Illness (HPI) 81 year old gentleman known to have swelling of his left lower extremity with some ulceration along the medial ankle has been having these problems for the last 3-4 months and does not know how it came on. No history of injury or no history of any infection in this area. He is known to have diabetes mellitus controlled with diet, hyperlipidemia, hypertension and chronic lumbar back pain with sciatica which is being treated by his PCP. Last hemoglobin A1c was 6.3 in June 2017. Last medical history is significant for esophageal stricture, hiatal hernia, vitamin D deficiency, status post back surgery o3, hernia repair as a child for both groins, hiatal hernia repair, joint replacement and revision of a knee surgery on the right side. He has quit smoking in 1982. He has never had a Doppler study of his lower extremity except remotely when he had knee surgery they looked for a blood clot on the right side. 09/13/2016 -- venous reflux study done on 09/12/2016 showed there is evidence of greater saphenous vein  reflux in the left lower extremity and the small saphenous vein and the posterior thigh is not competent. 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 With these results he definitely needs a referral to the vascular surgeons 09/20/2016 -- he has an appointment to see the vascular  surgeons on May 1 10/03/2016 -- patient had a 4 layer compression on his left lower extremity and had a lot of discomfort and pain. 10/11/2016-- was seen by Dr. Althea [name] -- review of his data he recommended staged laser ablation of his great and then small saphenous veins for reduction odd of his venous hypertension. He did examine his right leg with the SonoSite and he has dilatation of the saphenous vein on the right lower extremity too. Since there was no evidence of ulceration he recommended observation of the right lower extremity. He recommended to continue with local wound care at the wound center until his wound was completely healed. 10/18/2016 -- the patient has not been very compliant with his compression, his diet and his diuretics and as a result his lymphedema has increased markedly 10/25/2016 -- he has been compliant this week, has worn his 2 press compression wraps, taken his diuretics and is watching his salt intake. 11/08/2016 -- he had his venous procedure done last week and details of this have been reported and reviewed in his electronic medical record. he had a laser ablation of his great saphenous vein and this was done from mid calf to just below the saphenofemoral junction. He would return next week for ultrasound follow-up. He will then undergo the small saphenous vein ablation in a few weeks. 11/15/2016 -- his postprocedure visit showed good closure of his left great saphenous vein from the mid calf to 1-1/2 cm from the saphenofemoral junction. He had excellent early results from the ablation. The ablation of the left small saphenous vein was planned in a week 12/06/2016 -- he had his small saphenous vein venous ablation a week before and the duplex showed closure of his small saphenous vein to within half centimeter office saphenous popliteal junction with no DVT and he was pleased with the results. He recommended continue with elevation and compression and to see them back  on an as-needed basis 12/19/16; the patient's wound is actually closed. Sometime over the weekend he developed a small abrasion on the lower calf just above the original wound on the left medial malleolus although this is closed as well Readmission: 01/18/18 on evaluation today patient presents after having last been seen in our clinic July 2018. Subsequently he is a reoccurrence of the ulcers on the left ankle both medial and now a new area lateral that had been present back room for about a month he tells me. He states that this has done very well for about a year he really does not know of any injury or anything that happened that would have caused this reopening at this point. He has continued to wear compression stockings which is appropriate. He does not have lymphedema pumps. He has continued to also keep the area clean and dry as best he could. With that being said now that is been reopened is been harder for him to take care of this as far as compression stockings are concerned keeping them clean along with continuing to manage his fluid and swelling. His ABI appears to be great on the left registering at 1.14 today. He has previously undergone a venous ablation. Other than compression/lymphedema pumps he's  really done everything that he can to help manage and prevent these ulcers from forming. 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 01/30/18 on evaluation today patient appears to actually be doing very well in regard to his medial ankle ulcer. It's the lateral ulcer that actually is not doing quite as well today. Fortunately he did have some alginate left ovary start using this on the wounds and the medial ankle actually seems to be doing much better. The lateral ankle does actually need something to con a help with slough and buildup. The collagen really did not seem to do much for him in that regard. 02/13/18 on evaluation today patient appears to be doing about the same in regard to his ulcers.  Unfortunately I do not feel like were making good progress at this time. When I have debrided the wound the slough seems to come back fairly quickly in the lateral ankle ulcer. There does not appear to be any evidence of infection at this time. No fevers, chills, nausea, or vomiting noted at this time. 02/27/18 on evaluation today patient actually appears to be showing some signs of improvement in regard to both wound areas. I'm insurance on the medial aspect of his left lower extremity at the ankle whether or not the alginate may be sticking and causing some tearing off of new skin attempting to grow. With that being said the patient states it does not seem to be doing such just pulls up some of the dried dead skin around. Nonetheless this is something I wanted to watch out for going forward and actually I recommended that he take the dressing off in the shower when you could get it completely wet before removal to see how this does. Subsequently in regard to the lateral ankle ulcer he still has slough noted at this point although I do believe that he is tolerating the Medihoney much better I wish you could have gotten the Santyl but unfortunately the Santyl was too expensive gonna cost him $250. 03/06/18 on evaluation today patient appears to be doing about the same in regard to both ulcers of his left medial and lateral malleolus sites. He's been tolerating the dressing changes without complication. Unfortunately things do not seem to be improving in regard to either side at this time. Overall I think we may need to switch things up a little bit as far as the way we are performing the dressing changes currently. 03/27/18 on evaluation today patient's wound bed actually appears to be doing much better at both locations in regard to his ankle ulcers. He has been tolerating the dressing changes at this time without complication. Overall I'm very pleased with how things appear. The patient likewise states  that he's much better in regard to discomfort at this time. 04/10/18 on evaluation today patient actually appears to be doing well in regard to his left lateral ankle ulcer. He has been tolerating the dressing changes without complication. Fortunately there does not appear to be any evidence of infection. Overall I do feel like he is tolerating the Medihoney without complication. 04/24/18 upon evaluation today patient's wound actually appears to be healed on the lateral aspect of his ankle the medial aspect still continues to remain close without any evidence of complication at this time. Overall I have been extremely pleased with how things have progressed up to this point. The patient likewise is very happy he's not having any significant pain this time. He does wears compression stocking on a regular basis.  Readmission: 10-19-2021 upon evaluation today patient presents for evaluation here in the clinic concerning issues with a left lateral ulcer as well as a right lateral ulcer both along the aspect of his foot getting close to the ankle. This left area is the same place that I treated back in 2024. 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 This appears to be very inflamed and is also very painful. In the past he has been completely unable to tolerate the use of any compression wraps unfortunately. There does not appear to be any evidence of active infection locally nor systemically at this time which is good news. I do believe however we need to try to see what we can do to calm down the inflammation I think triamcinolone topically as well as oral prednisone would likely be indicated in this case. Patient's medical history really has not changed significantly since I last saw him in actually 2019 though I think is stated 2020 above. 10-26-21 upon evaluation today patient appears to still be having a lot of irritation and inflammation in regard to the  ankles laterally. The left is greater than the right. With that being said I do believe that he would benefit from a biopsy to confirm whether or not this may indeed be vasculitis. It actually seems like that physically and on examination but again I want to be sure that we are on the right track as far as treatment is concerned. The good news is he did not have a DVT I called him about that on Friday this was all in regard to the right leg. Overall I am very pleased in that regard. 11-02-2021 upon evaluation today patient appears to be doing poorly still in regard to his left lower extremity. Apparently he had a fever on Sunday which was around 101 and he was severely sick feeling and disoriented according to his wife. She came with him today because she did not think that he would actually tell me what was going on. With that being said unfortunately he did not go to the hospital he actually has been doing a little better over the past few days he been taking amoxicillin he has not taken any other medications orally at this point that are different. He has not been on any antibiotics either more recently other than the amoxicillin. Nonetheless he has not had a repeat in the past 3 days of that issue. Objective Constitutional Well-nourished and well-hydrated in no acute distress. Vitals Time Taken: 9:29 AM, Height: 66 in, Weight: 184 lbs, BMI: 29.7, Temperature: 97.8 F, Pulse: 69 bpm, Respiratory Rate: 18 breaths/min, Blood Pressure: 137/81 mmHg, Pulse Oximetry: 94 %. Respiratory normal breathing without difficulty. Psychiatric this patient is able to make decisions and demonstrates good insight into disease process. Alert and Oriented x 3. pleasant and cooperative. General Notes: Patient's wound is still showing significant erythema around the edges of the wound on the left as well as significant issues here with necrotic tissue. Again we tried debridement is extremely painful and we tried  other topical medications without good success. For that reason I do believe the Annitta Needs is going to be the way to go I Georgina Peer probably have to get this prior approved through his insurance we will see. In the meantime I do believe he probably needs an antibiotic switch and I Georgina Peer go and make that today. I am can also send in a PCR culture to evaluate  for what organisms may be causing issues here. His biopsy came back without any signs of malignancy there was mainly inflammation and ulceration noted with evidence of stasis dermatitis. Integumentary (Hair, Skin) Wound #6 status is Open. Original cause of wound was Gradually Appeared. The date acquired was: 07/25/2021. The wound has been in treatment 2 weeks. The wound is located on the Left,Lateral Foot. The wound measures 6.4cm length x 6.2cm width x 0.2cm depth; 31.165cm^2 area and 6.233cm^3 volume. There is Fat Layer (Subcutaneous Tissue) exposed. There is no tunneling or undermining noted. There is a medium amount of purulent drainage noted. The wound margin is distinct with the outline attached to the wound base. There is small (1-33%) red granulation within the wound bed. There is a large (67-100%) amount of necrotic tissue within the wound bed including Adherent Slough. Wound #7 status is Open. Original cause of wound was Gradually Appeared. The date acquired was: 06/20/2021. The wound has been in treatment 2 weeks. The wound is located on the Right,Lateral Foot. The wound measures 0.3cm length x 0.2cm width x 0.2cm depth; 0.047cm^2 area and 0.009cm^3 volume. There is Fat Layer (Subcutaneous Tissue) exposed. There is a medium amount of serosanguineous drainage noted. The wound margin is distinct with the outline attached to the wound base. There is large (67-100%) pink granulation within the wound bed. There is a small (1-33%) amount of necrotic tissue within the wound bed including Adherent Slough. 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 There was a Chemical/Enzymatic/Mechanical debridement performed by [name], PA. after achieving pain control using Lidocaine 5% topical ointment. Agent used was [employer]. A time out was conducted at 10:09, prior to the start of the procedure. There was no bleeding. The procedure was tolerated well. Post Debridement Measurements: 6.4cm length x 6.2cm width x 0.2cm depth; 6.233cm^3 volume. Character of Wound/Ulcer Post Debridement is stable. 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 There was a Chemical/Enzymatic/Mechanical debridement performed by [name], PA. after achieving pain control using Lidocaine 5% topical ointment. Agent used was [employer]. A time out was conducted at 10:09, prior to the start of the procedure. There was no bleeding. The procedure was tolerated well. Post Debridement Measurements: 0.3cm length x 0.2cm width x 0.2cm depth; 0.009cm^3 volume. Character of Wound/Ulcer Post Debridement is stable. Post procedure Diagnosis Wound #7: Same as Pre-Procedure Plan Follow-up Appointments: Return Appointment in 1 week. - 11/09/21 @ 8:45am with Bernette Redbird, Room 7) Other: - -Prescription for antibiotic sent to your pharmacy, take as directed. -Prescription for Santyl sent to [facility] Bathing/ Shower/ Hygiene: May shower and wash wound with soap and water. - wash with  antibacterial soap when changing dressing Edema Control - Lymphedema / SCD / Other: Elevate legs to the level of the heart or above for 30 minutes daily and/or when sitting, a frequency of: - throughout the day Patient to wear own compression stockings every day. - Use stockings daily to right leg Moisturize legs daily. Additional  Orders / Instructions: Follow Nutritious Diet Laboratory ordered were: Aerobic culture-specimen not specified - PCR Culture sent to [facility] The following medication(s) was prescribed: Bactrim DS oral 800 mg-160 mg tablet 1 1 tablet oral taken 2 times per day for 14 days starting 11/02/2021 Santyl topical 250 unit/gram ointment ointment topical Apply nickel thick daily to the wound bed and then cover with a dressing as directed in clinic x 30 days starting 11/02/2021 WOUND #6: - Foot Wound Laterality: Left, Lateral Cleanser: Soap and Water 1 x Per Day/30 Days Discharge Instructions: May shower and wash wound with dial antibacterial soap and water prior to dressing change. Prim Dressing: Santyl Ointment 1 x Per Day/30 Days ary Discharge Instructions: Apply nickel thick amount to wound bed as instructed Secondary Dressing: ALLEVYN Gentle Border, 5x5 (in/in) 1 x Per Day/30 Days Discharge Instructions: Apply over primary dressing as directed. Secondary Dressing: Woven Gauze Sponge, Non-Sterile 4x4 in 1 x Per Day/30 Days Discharge Instructions: Apply over primary dressing as directed. Com pression Wrap: ACE Wrap 1 x Per Day/30 Days WOUND #7: - Foot Wound Laterality: Right, Lateral Cleanser: Soap and Water 1 x Per Day/30 Days Discharge Instructions: May shower and wash wound with dial antibacterial soap and water prior to dressing change. Prim Dressing: Santyl Ointment 1 x Per Day/30 Days ary Discharge Instructions: Apply nickel thick amount to wound bed as instructed Secondary Dressing: ALLEVYN Gentle Border, 5x5 (in/in) 1 x Per Day/30 Days Discharge Instructions: Apply  over primary dressing as directed. Secondary Dressing: Woven Gauze Sponges 2x2 in 1 x Per Day/30 Days Discharge Instructions: Apply over primary dressing as directed. 1. I am good recommend currently that we going to continue with the wound care measures as before for the most part although we will switch to [facility] we will be changing this daily. 2. Also can recommend that we have the patient continue to use just dry gauze over top if it becomes too dry can always use moistened gauze but I do not think we need to go there yet especially with how much drainage she is having anyway. 3. I am also can recommend the patient should go to the ER ASAP if he develops any fevers, chills, nausea, vomiting, or diarrhea. This was previously discussed with him and honestly he did not go on Sunday when this happened I reiterated to him that this can be important for him to go if this occurs because he could be becoming septic and if that is the case he may need to be admitted to the hospital for IV antibiotics. His wife was present during the entirety of this conversation and knows what to look out for as well. 4. I did discuss with him today again compression wrapping and using the compression socks he is still extremely resistant to doing this stating that it is extremely uncomfortable and he just does not get a be able to do with the compression socks nor the compression wraps on a regular basis. The only thing on getting do right now is the Ace wraps were doing that at least. We will see patient back for reevaluation in 1 week here in the clinic. If anything worsens or changes patient will contact our office for additional recommendations. Electronic Signature(s) Signed: 11/02/2021 3:25:13 PM By: [name] PA-C Entered By: [name] on 11/02/2021 15:25:12 -------------------------------------------------------------------------------- SuperBill Details Patient Name: Date of Service: [place], [name]  [name] 11/02/2021 Medical Record Number: [id number] Patient Account Number: [id number] Date of Birth/Sex: Treating  RN: [birth year] (81 y.o. 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 L97.522 Non-pressure chronic ulcer of other part of left foot with fat layer exposed L97.512 Non-pressure chronic ulcer of other part of right foot with fat layer exposed E11.622 Type 2 diabetes mellitus with other skin ulcer Facility Procedures CPT4 Code: 15830940 Description: [phone] - DEBRIDE W/O ANES NON SELECT Modifier: Quantity: 1 Physician Procedures : CPT4 Code Description Modifier [id number] - WC PHYS LEVEL 4 - EST PT ICD-10 Diagnosis Description I87.333 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

## 2021-11-08 ENCOUNTER — Emergency Department (HOSPITAL_BASED_OUTPATIENT_CLINIC_OR_DEPARTMENT_OTHER)
Admission: EM | Admit: 2021-11-08 | Discharge: 2021-11-08 | Disposition: A | Discharge status: Home / Self Care | Payer: Medicare HMO | Attending: Emergency Medicine | Admitting: Emergency Medicine

## 2021-11-08 ENCOUNTER — Encounter (HOSPITAL_BASED_OUTPATIENT_CLINIC_OR_DEPARTMENT_OTHER): Payer: Self-pay | Admitting: Obstetrics and Gynecology

## 2021-11-08 ENCOUNTER — Other Ambulatory Visit: Payer: Self-pay

## 2021-11-08 DIAGNOSIS — L7622 Postprocedural hemorrhage and hematoma of skin and subcutaneous tissue following other procedure: Secondary | ICD-10-CM | POA: Insufficient documentation

## 2021-11-08 DIAGNOSIS — T148XXA Other injury of unspecified body region, initial encounter: Secondary | ICD-10-CM

## 2021-11-08 DIAGNOSIS — Z91040 Latex allergy status: Secondary | ICD-10-CM | POA: Diagnosis not present

## 2021-11-08 LAB — CBC
HCT: 32.4 % — ABNORMAL LOW (ref 39.0–52.0)
Hemoglobin: 11.2 g/dL — ABNORMAL LOW (ref 13.0–17.0)
MCH: 32.4 pg (ref 26.0–34.0)
MCHC: 34.6 g/dL (ref 30.0–36.0)
MCV: 93.6 fL (ref 80.0–100.0)
Platelets: 458 10*3/uL — ABNORMAL HIGH (ref 150–400)
RBC: 3.46 MIL/uL — ABNORMAL LOW (ref 4.22–5.81)
RDW: 14.6 % (ref 11.5–15.5)
WBC: 8.1 10*3/uL (ref 4.0–10.5)
nRBC: 0 % (ref 0.0–0.2)

## 2021-11-08 NOTE — ED Provider Notes (Signed)
[facility] EMERGENCY DEPT Provider Note   CSN: [id number] Arrival date & time: 11/08/21  1718     History  Chief Complaint  Patient presents with   Wound Check    [name] Fernandez is a 81 y.o. male.   Wound Check  Patient with bleeding from wound on left foot.  Began earlier today.  Reportedly large amount of blood at home.  Not feeling lightheaded or dizziness.  Does see wound care.  States he had a biopsy on the foot around a week ago.  Not on blood thinners.  Dressed wound at home.  Reported had a stream of nonpulsatile blood.    Home Medications Prior to Admission medications   Medication Sig Start Date End Date Taking? Authorizing Provider  Ascorbic Acid (VITAMIN C) 1000 MG tablet Take 1,000 mg by mouth daily.    [provider]  Cholecalciferol (VITAMIN D PO) Take 5,000 Int'l Units by mouth daily.    [provider]  clotrimazole-betamethasone (LOTRISONE) cream APPLY TO AFFECTED AREA TWICE A DAY 10/24/21   Magda Bernheim, NP  COD LIVER OIL PO Take by mouth.    [provider]  gabapentin (NEURONTIN) 600 MG tablet Take 1/2 to 1 tablet 2 to 3 x /Daily as needed for Chronic Pain 10/24/21   Unk Pinto, MD  hydrOXYzine (ATARAX) 25 MG tablet TAKE 1 TABLET BY MOUTH 3 TIMES DAILY AS NEEDED FOR ITCHING. 10/24/21   Magda Bernheim, NP  omeprazole (PRILOSEC) 20 MG capsule Take 1 capsule (20 mg total) by mouth daily. 04/30/12   Pyrtle, Lajuan Lines, MD  Red Yeast Rice 600 MG CAPS PRN 02/26/21   Unk Pinto, MD  Sennosides (SENOKOT PO) Take by mouth as needed.    [provider]  triamcinolone cream (KENALOG) 0.5 % Apply 1 application topically 2 (two) times daily. 08/11/20   Unk Pinto, MD  triamterene-hydrochlorothiazide (MAXZIDE-25) 37.5-25 MG tablet Take 1 tablet by mouth daily. 09/14/21 09/14/22  Magda Bernheim, NP  Zinc 50 MG TABS Take by mouth.    [provider]      Allergies    Feraheme [ferumoxytol], Naprosyn [naproxen],  Latex, Levothyroxine, and Doxycycline    Review of Systems   Review of Systems  Physical Exam Updated Vital Signs BP (!) 112/57   Pulse 66   Temp 98.1 F (36.7 C)   Resp 18   Ht 5' 6"  (1.676 m)   Wt 83.9 kg   SpO2 98%   BMI 29.86 kg/m  Physical Exam Vitals and nursing note reviewed.  Skin:    Comments: Ulcer on lateral aspect of left foot near heel.  There is some clot in place once dressing is removed.  Clot was removed and there was a small stream of nonpulsatile constant bleeding.  Direct pressure held but continued to bleed.  Cauterized with silver nitrate with stopping of bleeding.  Nonadherent dressing placed and wrapped in Ace bandage.  Does have chronic edema on foot and lower leg.  Neurological:     Mental Status: He is alert and oriented to person, place, and time.    ED Results / Procedures / Treatments   Labs (all labs ordered are listed, but only abnormal results are displayed) Labs Reviewed  CBC - Abnormal; Notable for the following components:      Result Value   RBC 3.46 (*)    Hemoglobin 11.2 (*)    HCT 32.4 (*)    Platelets 458 (*)  All other components within normal limits    EKG None  Radiology No results found.  Procedures Procedures    Medications Ordered in ED Medications - No data to display  ED Course/ Medical Decision Making/ A&P                           Medical Decision Making Amount and/or Complexity of Data Reviewed Labs: ordered.   Patient presents with bleeding from wound on left foot.  Reportedly had a fair amount of bleeding at home.  Hemoglobin now decreased.  Vitals reassuring here not feeling lightheaded or dizzy.  Second blood pressure mildly decreased but not feeling bad.  Did have area of brisk but nonpulsatile bleeding on foot.  Cauterized with control of bleeding.  Dressing placed and monitored in the ER without bleeding.  Hemoglobin slightly decreased but has follow-up tomorrow with wound care center.  Instructed  to leave dressing on until follow-up tomorrow.  If lightheadedness and dizziness develop may need to be checked a hemoglobin.        Final Clinical Impression(s) / ED Diagnoses Final diagnoses:  Bleeding from wound    Rx / DC Orders ED Discharge Orders     None         Davonna Belling, MD 11/08/21 1930

## 2021-11-08 NOTE — Discharge Instructions (Signed)
The bleeding looks more controlled on your foot.  Your hemoglobin is slightly lower than before.  Follow-up with the wound care center tomorrow as planned.  Leave the dressing on until then.

## 2021-11-08 NOTE — ED Notes (Signed)
Pt verbalizes understanding of discharge instructions. Opportunity for questioning and answers were provided. Pt discharged from ED to home with spouse.

## 2021-11-08 NOTE — ED Triage Notes (Signed)
Patient reports to the ER for a venous ulcer that will not stop bleeding. Patient reports he called the wound center and was told to come here.  Patient reports the wound dressing was full of blood and it was just recently changed.

## 2021-11-09 ENCOUNTER — Encounter (HOSPITAL_BASED_OUTPATIENT_CLINIC_OR_DEPARTMENT_OTHER): Payer: Medicare HMO | Admitting: Physician Assistant

## 2021-11-09 DIAGNOSIS — E11621 Type 2 diabetes mellitus with foot ulcer: Secondary | ICD-10-CM | POA: Diagnosis not present

## 2021-11-09 DIAGNOSIS — I87333 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 extremity: Secondary | ICD-10-CM | POA: Diagnosis not present

## 2021-11-09 DIAGNOSIS — M318 Other specified necrotizing vasculopathies: Secondary | ICD-10-CM | POA: Diagnosis not present

## 2021-11-09 DIAGNOSIS — G8929 Other chronic pain: Secondary | ICD-10-CM | POA: Diagnosis not present

## 2021-11-09 DIAGNOSIS — L97512 Non-pressure chronic ulcer of other part of right foot with fat layer exposed: Secondary | ICD-10-CM | POA: Diagnosis not present

## 2021-11-09 DIAGNOSIS — E11622 Type 2 diabetes mellitus with other skin ulcer: Secondary | ICD-10-CM | POA: Diagnosis not present

## 2021-11-09 DIAGNOSIS — M5430 Sciatica, unspecified side: Secondary | ICD-10-CM | POA: Diagnosis not present

## 2021-11-09 DIAGNOSIS — I10 Essential (primary) hypertension: Secondary | ICD-10-CM | POA: Diagnosis not present

## 2021-11-09 DIAGNOSIS — E785 Hyperlipidemia, unspecified: Secondary | ICD-10-CM | POA: Diagnosis not present

## 2021-11-09 DIAGNOSIS — L97522 Non-pressure chronic ulcer of other part of left foot with fat layer exposed: Secondary | ICD-10-CM | POA: Diagnosis not present

## 2021-11-09 NOTE — Progress Notes (Addendum)
PRUDENCIO, VELAZCO ([id number]) Visit Report for 11/09/2021 Chief Complaint Document Details Patient Name: Date of Service: [place], [name] 11/09/2021 8:45 A M Medical Record Number: [id number] Patient Account Number: [id number] Date of Birth/Sex: Treating RN: 02-Dec-1940 (81 y.o. [name] Primary Care Provider: [name] Other Clinician: Referring Provider: Treating Provider/Extender: Azucena Kuba in Treatment: 3 Information Obtained from: Patient Chief Complaint Bilateral foot ulcers Electronic Signature(s) Signed: 11/09/2021 9:11:53 AM By: [name] PA-C Entered By: [name] on 11/09/2021 09:11:53 -------------------------------------------------------------------------------- Debridement Details Patient Name: Date of Service: [place], [name] 11/09/2021 8:45 A M Medical Record Number: [id number] Patient Account Number: [id number] Date of Birth/Sex: Treating RN: 1940/06/24 (81 y.o. [name] Primary Care Provider: [name] Other Clinician: Referring Provider: Treating Provider/Extender: Azucena Kuba in Treatment: 3 Debridement Performed for Assessment: Wound #6 Left,Lateral Foot Performed By: Physician [name], PA Debridement Type: Chemical/Enzymatic/Mechanical Agent Used: Santyl Level of Consciousness (Pre-procedure): Awake and Alert Pre-procedure Verification/Time Out Yes - 09:45 Taken: Start Time: 09:46 Pain Control: Other : Benzocaine Bleeding: None End Time: 09:50 Response to Treatment: Procedure was tolerated well Level of Consciousness (Post- Awake and Alert procedure): Post Debridement Measurements of Total Wound Length: (cm) 6.5 Width: (cm) 5 Depth: (cm) 0.2 Volume: (cm) 5.105 Character of Wound/Ulcer Post Debridement: Stable Post Procedure Diagnosis Same as Pre-procedure Electronic Signature(s) Signed: 11/09/2021 4:11:22 PM By: [name]  PA-C Signed: 11/09/2021 5:07:28 PM By: [name] Signed: 11/09/2021 5:07:28 PM By: [name] Entered By: [name] on 11/09/2021 09:50:44 -------------------------------------------------------------------------------- Debridement Details Patient Name: Date of Service: [place], [name] 11/09/2021 8:45 A M Medical Record Number: [id number] Patient Account Number: [id number] Date of Birth/Sex: Treating RN: 1941-04-24 (81 y.o. [name] Primary Care Provider: [name] Other Clinician: Referring Provider: Treating Provider/Extender: Azucena Kuba in Treatment: 3 Debridement Performed for Assessment: Wound #7 Right,Lateral Foot Performed By: Physician [name], PA Debridement Type: Chemical/Enzymatic/Mechanical Agent Used: Santyl Level of Consciousness (Pre-procedure): Awake and Alert Pre-procedure Verification/Time Out Yes - 09:45 Taken: Start Time: 09:46 Pain Control: Other : Benzocaine Bleeding: None End Time: 09:50 Response to Treatment: Procedure was tolerated well Level of Consciousness (Post- Awake and Alert procedure): Post Debridement Measurements of Total Wound Length: (cm) 0.3 Width: (cm) 0.3 Depth: (cm) 0.2 Volume: (cm) 0.014 Character of Wound/Ulcer Post Debridement: Stable Post Procedure Diagnosis Same as Pre-procedure Electronic Signature(s) Signed: 11/09/2021 4:11:22 PM By: [name] PA-C Signed: 11/09/2021 5:07:28 PM By: [name] Entered By: [name] on 11/09/2021 09:51:05 -------------------------------------------------------------------------------- HPI Details Patient Name: Date of Service: [name], [name] 11/09/2021 8:45 A M Medical Record Number: [id number] Patient Account Number: [id number] Date of Birth/Sex: Treating RN: 05-29-1941 (81 y.o. [name] Primary Care Provider: [name] Other Clinician: Referring Provider: Treating Provider/Extender:  Azucena Kuba in Treatment: 3 History of Present Illness HPI Description: 81 year old gentleman known to have swelling of his left lower extremity with some ulceration along the medial ankle has been having these problems for the last 3-4 months and does not know how it came on. No history of injury or no history of any infection in this area. He is known to have diabetes mellitus controlled with diet, hyperlipidemia, hypertension and chronic lumbar back pain with sciatica which is being treated by his PCP. Last hemoglobin A1c was 6.3  in June 2017. Last medical history is significant for esophageal stricture, hiatal hernia, vitamin D deficiency, status post back surgery 3, hernia repair as a child for both groins, hiatal hernia repair, joint replacement and revision of a knee surgery on the right side. He has quit smoking in 1982. He has never had a Doppler study of his lower extremity except remotely when he had knee surgery they looked for a blood clot on the right side. 09/13/2016 -- venous reflux study done on 09/12/2016 showed there is evidence of greater saphenous vein reflux in the left lower extremity and the small saphenous vein and the posterior thigh is not competent. 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 With these results he definitely needs a referral to the vascular surgeons 09/20/2016 -- he has an appointment to see the vascular surgeons on May 1 10/03/2016 -- patient had a 4 layer compression on his left lower extremity and had a lot of discomfort and pain. 10/11/2016-- was seen by Dr. Althea [name] -- review of his data he recommended staged laser ablation of his great and then small saphenous veins for reduction odd of his venous hypertension. He did examine his right leg with the SonoSite and he has dilatation of the saphenous vein on the right lower extremity too. Since there was no evidence of ulceration he recommended observation  of the right lower extremity. He recommended to continue with local wound care at the wound center until his wound was completely healed. 10/18/2016 -- the patient has not been very compliant with his compression, his diet and his diuretics and as a result his lymphedema has increased markedly 10/25/2016 -- he has been compliant this week, has worn his 2 press compression wraps, taken his diuretics and is watching his salt intake. 11/08/2016 -- he had his venous procedure done last week and details of this have been reported and reviewed in his electronic medical record. he had a laser ablation of his great saphenous vein and this was done from mid calf to just below the saphenofemoral junction. He would return next week for ultrasound follow-up. He will then undergo the small saphenous vein ablation in a few weeks. 11/15/2016 -- his postprocedure visit showed good closure of his left great saphenous vein from the mid calf to 1-1/2 cm from the saphenofemoral junction. He had excellent early results from the ablation. The ablation of the left small saphenous vein was planned in a week 12/06/2016 -- he had his small saphenous vein venous ablation a week before and the duplex showed closure of his small saphenous vein to within half centimeter office saphenous popliteal junction with no DVT and he was pleased with the results. He recommended continue with elevation and compression and to see them back on an as-needed basis 12/19/16; the patient's wound is actually closed. Sometime over the weekend he developed a small abrasion on the lower calf just above the original wound on the left medial malleolus although this is closed as well Readmission: 01/18/18 on evaluation today patient presents after having last been seen in our clinic July 2018. Subsequently he is a reoccurrence of the ulcers on the left ankle both medial and now a new area lateral that had been present back room for about a month he tells  me. He states that this has done very well for about a year he really does not know of any injury or anything that happened that would have caused this reopening at this point.  He has continued to wear compression stockings which is appropriate. He does not have lymphedema pumps. He has continued to also keep the area clean and dry as best he could. With that being said now that is been reopened is been harder for him to take care of this as far as compression stockings are concerned keeping them clean along with continuing to manage his fluid and swelling. His ABI appears to be great on the left registering at 1.14 today. He has previously undergone a venous ablation. Other than compression/lymphedema pumps he's really done everything that he can to help manage and prevent these ulcers from forming. 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 01/30/18 on evaluation today patient appears to actually be doing very well in regard to his medial ankle ulcer. It's the lateral ulcer that actually is not doing quite as well today. Fortunately he did have some alginate left ovary start using this on the wounds and the medial ankle actually seems to be doing much better. The lateral ankle does actually need something to con a help with slough and buildup. The collagen really did not seem to do much for him in that regard. 02/13/18 on evaluation today patient appears to be doing about the same in regard to his ulcers. Unfortunately I do not feel like were making good progress at this time. When I have debrided the wound the slough seems to come back fairly quickly in the lateral ankle ulcer. There does not appear to be any evidence of infection at this time. No fevers, chills, nausea, or vomiting noted at this time. 02/27/18 on evaluation today patient actually appears to be showing some signs of improvement in regard to both wound areas. I'm insurance on the medial aspect of his left lower extremity at the ankle whether or  not the alginate may be sticking and causing some tearing off of new skin attempting to grow. With that being said the patient states it does not seem to be doing such just pulls up some of the dried dead skin around. Nonetheless this is something I wanted to watch out for going forward and actually I recommended that he take the dressing off in the shower when you could get it completely wet before removal to see how this does. Subsequently in regard to the lateral ankle ulcer he still has slough noted at this point although I do believe that he is tolerating the Medihoney much better I wish you could have gotten the Santyl but unfortunately the Santyl was too expensive gonna cost him $250. 03/06/18 on evaluation today patient appears to be doing about the same in regard to both ulcers of his left medial and lateral malleolus sites. He's been tolerating the dressing changes without complication. Unfortunately things do not seem to be improving in regard to either side at this time. Overall I think we may need to switch things up a little bit as far as the way we are performing the dressing changes currently. 03/27/18 on evaluation today patient's wound bed actually appears to be doing much better at both locations in regard to his ankle ulcers. He has been tolerating the dressing changes at this time without complication. Overall I'm very pleased with how things appear. The patient likewise states that he's much better in regard to discomfort at this time. 04/10/18 on evaluation today patient actually appears to be doing well in regard to his left lateral ankle ulcer. He has been tolerating the dressing changes without complication. Fortunately there  does not appear to be any evidence of infection. Overall I do feel like he is tolerating the Medihoney without complication. 04/24/18 upon evaluation today patient's wound actually appears to be healed on the lateral aspect of his ankle the medial aspect  still continues to remain close without any evidence of complication at this time. Overall I have been extremely pleased with how things have progressed up to this point. The patient likewise is very happy he's not having any significant pain this time. He does wears compression stocking on a regular basis. Readmission: 10-19-2021 upon evaluation today patient presents for evaluation here in the clinic concerning issues with a left lateral ulcer as well as a right lateral ulcer both along the aspect of his foot getting close to the ankle. This left area is the same place that I treated back in 2024. 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 This appears to be very inflamed and is also very painful. In the past he has been completely unable to tolerate the use of any compression wraps unfortunately. There does not appear to be any evidence of active infection locally nor systemically at this time which is good news. I do believe however we need to try to see what we can do to calm down the inflammation I think triamcinolone topically as well as oral prednisone would likely be indicated in this case. Patient's medical history really has not changed significantly since I last saw him in actually 2019 though I think is stated 2020 above. 10-26-21 upon evaluation today patient appears to still be having a lot of irritation and inflammation in regard to the ankles laterally. The left is greater than the right. With that being said I do believe that he would benefit from a biopsy to confirm whether or not this may indeed be vasculitis. It actually seems like that physically and on examination but again I want to be sure that we are on the right track as far as treatment is concerned. The good news is he did not have a DVT I called him about that on Friday this was all in regard to the right leg. Overall I am very pleased in that regard. 11-02-2021 upon evaluation  today patient appears to be doing poorly still in regard to his left lower extremity. Apparently he had a fever on Sunday which was around 101 and he was severely sick feeling and disoriented according to his wife. She came with him today because she did not think that he would actually tell me what was going on. With that being said unfortunately he did not go to the hospital he actually has been doing a little better over the past few days he been taking amoxicillin he has not taken any other medications orally at this point that are different. He has not been on any antibiotics either more recently other than the amoxicillin. Nonetheless he has not had a repeat in the past 3 days of that issue. 11-09-2021 upon evaluation today patient presents after having had quite an ordeal over the past several days. Subsequently yesterday I got a call from [place] here in [place] concerning the patient and the fact that he was having bleeding that been going on for the past 24 hours and was not stopping. Subsequently my advice was to have the patient go to the ER for further evaluation and treatment. It seems to me that he had a regular way part  of the wound down to the point that a blood vessel and open. Apparently he was filling up trash bags wrapped around his foot with blood. In fact his lab review which I did do him as well today showed that he went from a white hemoglobin on 09-14-2021 of 13.6 down to a hemoglobin of 11.2 on 11-08-2021 this was yesterday. Subsequently this is due to the amount of blood loss that he has had acutely. Again I do not believe he is at transfusion stage but at the same time he is extremely weak and states that a couple times when he is going to stand up he is actually fallen back into his chair. Nonetheless I do believe that he probably needs to supplement with iron and I did recommend that there are some over-the-counter products he could get in this regard. Subsequently also  suggested that the patient needs to be drinking plenty of water he apparently drinks Coke and he drinks coffee but that is about it. Subsequently following this conversation his wife was present during this time as well and I do think that he is going to try to drink more I think it is of utmost importance. With that being said I did review his PCR culture as well it was positive for 2 organisms. This was Pseudomonas and Enterobacter. Both were showing up as being very prevalent in the PCR culture and subsequently Cipro will take care of the situation which is what I am recommending at this point based on what we see. This will take the place of the Bactrim and can have him discontinue the Bactrim the only thing is he is getting need to stop taking the hydroxyzine if he has been taking it that is the Vistaril and he tells me that he only takes this when he itches he has not been taking it recently. Electronic Signature(s) Signed: 11/09/2021 2:03:57 PM By: [name] PA-C Entered By: [name] on 11/09/2021 14:03:57 -------------------------------------------------------------------------------- Physical Exam Details Patient Name: Date of Service: [place], [name] 11/09/2021 8:45 A M Medical Record Number: [id number] Patient Account Number: [id number] Date of Birth/Sex: Treating RN: 03-Jul-1940 (81 y.o. [name] Primary Care Provider: [name] Other Clinician: Referring Provider: Treating Provider/Extender: Azucena Kuba in Treatment: 3 Constitutional Well-nourished and well-hydrated in no acute distress. Respiratory normal breathing without difficulty. Psychiatric this patient is able to make decisions and demonstrates good insight into disease process. Alert and Oriented x 3. pleasant and cooperative. Notes Upon inspection patient's wound actually appears to be doing better I do believe the [facility] which she did finally get actually  has been helping to soften this up and that is good news. With that being said I do not see any evidence of infection systemically which is great news but locally he still shows signs of left leg cellulitis and swelling. I do believe that the change in antibiotic is good to help regain me taking care of the Pseudomonas which was prevalent and not managed with the Bactrim. Electronic Signature(s) Signed: 11/09/2021 2:04:35 PM By: [name] PA-C Entered By: [name] on 11/09/2021 14:04:34 -------------------------------------------------------------------------------- Physician Orders Details Patient Name: Date of Service: [place], [name] 11/09/2021 8:45 A M Medical Record Number: [id number] Patient Account Number: [id number] Date of Birth/Sex: Treating RN: 01-Jul-1940 (81 y.o. 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 - 11/16/21 @ 9:30 am with Bernette Redbird, Room 7) Return A Other: - -Prescription for Cipro sent to your pharmacy, take as directed. Do not take with Hydroxyzine (Vistaril) -We recommend taking an Iron Supplement Bathing/ Shower/ Hygiene May shower and wash wound with soap and water. - wash with antibacterial soap when changing dressing Edema Control - Lymphedema / SCD / Other Elevate legs to the  level of the heart or above for 30 minutes daily and/or when sitting, a frequency of: - throughout the day Patient to wear own compression stockings every day. - Use stockings daily to right leg Moisturize legs daily. - Eucerin (in jar) Additional Orders / Instructions Follow Nutritious Diet Wound Treatment Wound #6 - Foot Wound Laterality: Left, Lateral Cleanser: Soap and Water 1 x Per Day/30 Days Discharge Instructions: May shower and wash wound with dial antibacterial soap and water prior to dressing change. Prim Dressing: Santyl Ointment 1 x Per Day/30 Days ary Discharge Instructions: Apply nickel thick amount to wound bed as instructed Secondary Dressing: ABD Pad, 5x9 1 x Per Day/30 Days Discharge Instructions: Apply over primary dressing as directed. Secondary Dressing: Woven Gauze Sponge, Non-Sterile 4x4 in 1 x Per Day/30 Days Discharge Instructions: Apply over primary dressing as directed. Secured With: [employer], 4.5x3.1 (in/yd) 1 x Per Day/30 Days Discharge Instructions: Secure with Kerlix as directed. Secured With: 41M Medipore H Soft Cloth Surgical T ape, 4 x 10 (in/yd) 1 x Per Day/30 Days Discharge Instructions: Secure with tape as directed. Compression Wrap: ACE Wrap 1 x Per Day/30 Days Wound #7 - Foot Wound Laterality: Right, Lateral Cleanser: Soap and Water 1 x Per Day/30 Days Discharge Instructions: May shower and wash wound with dial antibacterial soap and water prior to dressing change. Prim Dressing: Santyl Ointment 1 x Per Day/30 Days ary Discharge Instructions: Apply nickel thick amount to wound bed as instructed Secondary Dressing: ALLEVYN Gentle Border, 5x5 (in/in) 1 x Per Day/30 Days Discharge Instructions: Apply over primary dressing as directed. Secondary Dressing: Woven Gauze Sponges 2x2 in 1 x Per Day/30 Days Discharge Instructions: Apply over primary dressing as directed. Patient Medications llergies: latex, Feraheme, Naprosyn,  levothyroxine A Notifications Medication Indication Start End 11/09/2021 Cipro DOSE 1 - oral 500 mg tablet - 1 tablet oral taken 2 times per day for 21 days. Do not take hydroxyzine with this medication Electronic Signature(s) Signed: 11/09/2021 10:01:39 AM By: [name] PA-C Entered By: [name] on 11/09/2021 10:01:38 -------------------------------------------------------------------------------- Problem List Details Patient Name: Date of Service: [place], [name] 11/09/2021 8:45 A M Medical Record Number: [id number] Patient Account Number: [id number] Date of Birth/Sex: Treating RN: October 07, 1940 (81 y.o. 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 10/19/2021 No Yes L97.522 Non-pressure chronic ulcer of other part of left foot with fat layer exposed 10/19/2021 No Yes L97.512 Non-pressure chronic ulcer of other part of right foot with fat layer exposed 10/19/2021 No Yes E11.622 Type 2 diabetes mellitus with other skin ulcer 10/19/2021 No Yes Inactive Problems Resolved Problems Electronic Signature(s) Signed: 11/09/2021 9:11:47 AM By: [name] PA-C Entered By: [name] on 11/09/2021 09:11:47 -------------------------------------------------------------------------------- Progress Note Details Patient Name: Date of Service: [place], [name] 11/09/2021 8:45 A M Medical Record Number: [id number] Patient Account Number: [id number] Date of Birth/Sex: Treating RN: September 01, 1940 (81 y.o. [name] Primary Care Provider:  [name] Other Clinician: Referring Provider: Treating Provider/Extender: Azucena Kuba in Treatment: 3 Subjective Chief Complaint Information obtained from Patient Bilateral foot ulcers History of Present Illness (HPI) 81 year old gentleman known to have swelling of his left lower extremity with some ulceration along the medial ankle has been having these problems for the last 3-4 months and does not know how it came on. No history of injury or no history of any infection in this area. He is known to have diabetes mellitus controlled with diet, hyperlipidemia, hypertension and chronic lumbar back pain with sciatica which is being treated by his PCP. Last hemoglobin A1c was 6.3 in June 2017. Last medical history is significant for esophageal stricture, hiatal hernia, vitamin D deficiency, status post back surgery o3, hernia repair as a child for both groins, hiatal hernia repair, joint replacement and revision of a knee surgery on the right side. He has quit smoking in 1982. He has never had a Doppler study of his lower extremity except remotely when he had knee surgery they looked for a blood clot on the right side. 09/13/2016 -- venous reflux study done on 09/12/2016 showed there is evidence of greater saphenous vein reflux in the left lower extremity and the small saphenous vein and the posterior thigh is not competent. 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 With these results he definitely needs a referral to the vascular surgeons 09/20/2016 -- he has an appointment to see the vascular surgeons on May 1 10/03/2016 -- patient had a 4 layer compression on his left lower extremity and had a lot of discomfort and pain. 10/11/2016-- was seen by Dr. Althea [name] -- review of his data he recommended staged laser ablation of his great and then small saphenous veins for reduction odd of his venous hypertension. He did examine his right leg  with the SonoSite and he has dilatation of the saphenous vein on the right lower extremity too. Since there was no evidence of ulceration he recommended observation of the right lower extremity. He recommended to continue with local wound care at the wound center until his wound was completely healed. 10/18/2016 -- the patient has not been very compliant with his compression, his diet and his diuretics and as a result his lymphedema has increased markedly 10/25/2016 -- he has been compliant this week, has worn his 2 press compression wraps, taken his diuretics and is watching his salt intake. 11/08/2016 -- he had his venous procedure done last week and details of this have been reported and reviewed in his electronic medical record. he had a laser ablation of his great saphenous vein and this was done from mid calf to just below the saphenofemoral junction. He would return next week for ultrasound follow-up. He will  then undergo the small saphenous vein ablation in a few weeks. 11/15/2016 -- his postprocedure visit showed good closure of his left great saphenous vein from the mid calf to 1-1/2 cm from the saphenofemoral junction. He had excellent early results from the ablation. The ablation of the left small saphenous vein was planned in a week 12/06/2016 -- he had his small saphenous vein venous ablation a week before and the duplex showed closure of his small saphenous vein to within half centimeter office saphenous popliteal junction with no DVT and he was pleased with the results. He recommended continue with elevation and compression and to see them back on an as-needed basis 12/19/16; the patient's wound is actually closed. Sometime over the weekend he developed a small abrasion on the lower calf just above the original wound on the left medial malleolus although this is closed as well Readmission: 01/18/18 on evaluation today patient presents after having last been seen in our clinic July 2018.  Subsequently he is a reoccurrence of the ulcers on the left ankle both medial and now a new area lateral that had been present back room for about a month he tells me. He states that this has done very well for about a year he really does not know of any injury or anything that happened that would have caused this reopening at this point. He has continued to wear compression stockings which is appropriate. He does not have lymphedema pumps. He has continued to also keep the area clean and dry as best he could. With that being said now that is been reopened is been harder for him to take care of this as far as compression stockings are concerned keeping them clean along with continuing to manage his fluid and swelling. His ABI appears to be great on the left registering at 1.14 today. He has previously undergone a venous ablation. Other than compression/lymphedema pumps he's really done everything that he can to help manage and prevent these ulcers from forming. 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 01/30/18 on evaluation today patient appears to actually be doing very well in regard to his medial ankle ulcer. It's the lateral ulcer that actually is not doing quite as well today. Fortunately he did have some alginate left ovary start using this on the wounds and the medial ankle actually seems to be doing much better. The lateral ankle does actually need something to con a help with slough and buildup. The collagen really did not seem to do much for him in that regard. 02/13/18 on evaluation today patient appears to be doing about the same in regard to his ulcers. Unfortunately I do not feel like were making good progress at this time. When I have debrided the wound the slough seems to come back fairly quickly in the lateral ankle ulcer. There does not appear to be any evidence of infection at this time. No fevers, chills, nausea, or vomiting noted at this time. 02/27/18 on evaluation today patient actually  appears to be showing some signs of improvement in regard to both wound areas. I'm insurance on the medial aspect of his left lower extremity at the ankle whether or not the alginate may be sticking and causing some tearing off of new skin attempting to grow. With that being said the patient states it does not seem to be doing such just pulls up some of the dried dead skin around. Nonetheless this is something I wanted to watch out for going forward and actually  I recommended that he take the dressing off in the shower when you could get it completely wet before removal to see how this does. Subsequently in regard to the lateral ankle ulcer he still has slough noted at this point although I do believe that he is tolerating the Medihoney much better I wish you could have gotten the Santyl but unfortunately the Santyl was too expensive gonna cost him $250. 03/06/18 on evaluation today patient appears to be doing about the same in regard to both ulcers of his left medial and lateral malleolus sites. He's been tolerating the dressing changes without complication. Unfortunately things do not seem to be improving in regard to either side at this time. Overall I think we may need to switch things up a little bit as far as the way we are performing the dressing changes currently. 03/27/18 on evaluation today patient's wound bed actually appears to be doing much better at both locations in regard to his ankle ulcers. He has been tolerating the dressing changes at this time without complication. Overall I'm very pleased with how things appear. The patient likewise states that he's much better in regard to discomfort at this time. 04/10/18 on evaluation today patient actually appears to be doing well in regard to his left lateral ankle ulcer. He has been tolerating the dressing changes without complication. Fortunately there does not appear to be any evidence of infection. Overall I do feel like he is tolerating  the Medihoney without complication. 04/24/18 upon evaluation today patient's wound actually appears to be healed on the lateral aspect of his ankle the medial aspect still continues to remain close without any evidence of complication at this time. Overall I have been extremely pleased with how things have progressed up to this point. The patient likewise is very happy he's not having any significant pain this time. He does wears compression stocking on a regular basis. Readmission: 10-19-2021 upon evaluation today patient presents for evaluation here in the clinic concerning issues with a left lateral ulcer as well as a right lateral ulcer both along the aspect of his foot getting close to the ankle. This left area is the same place that I treated back in 2024. 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 This appears to be very inflamed and is also very painful. In the past he has been completely unable to tolerate the use of any compression wraps unfortunately. There does not appear to be any evidence of active infection locally nor systemically at this time which is good news. I do believe however we need to try to see what we can do to calm down the inflammation I think triamcinolone topically as well as oral prednisone would likely be indicated in this case. Patient's medical history really has not changed significantly since I last saw him in actually 2019 though I think is stated 2020 above. 10-26-21 upon evaluation today patient appears to still be having a lot of irritation and inflammation in regard to the ankles laterally. The left is greater than the right. With that being said I do believe that he would benefit from a biopsy to confirm whether or not this may indeed be vasculitis. It actually seems like that physically and on examination but again I want to be sure that we are on the right track as far as treatment is concerned. The good news is he  did not have a DVT I called him  about that on Friday this was all in regard to the right leg. Overall I am very pleased in that regard. 11-02-2021 upon evaluation today patient appears to be doing poorly still in regard to his left lower extremity. Apparently he had a fever on Sunday which was around 101 and he was severely sick feeling and disoriented according to his wife. She came with him today because she did not think that he would actually tell me what was going on. With that being said unfortunately he did not go to the hospital he actually has been doing a little better over the past few days he been taking amoxicillin he has not taken any other medications orally at this point that are different. He has not been on any antibiotics either more recently other than the amoxicillin. Nonetheless he has not had a repeat in the past 3 days of that issue. 11-09-2021 upon evaluation today patient presents after having had quite an ordeal over the past several days. Subsequently yesterday I got a call from [place] here in [place] concerning the patient and the fact that he was having bleeding that been going on for the past 24 hours and was not stopping. Subsequently my advice was to have the patient go to the ER for further evaluation and treatment. It seems to me that he had a regular way part of the wound down to the point that a blood vessel and open. Apparently he was filling up trash bags wrapped around his foot with blood. In fact his lab review which I did do him as well today showed that he went from a white hemoglobin on 09-14-2021 of 13.6 down to a hemoglobin of 11.2 on 11-08-2021 this was yesterday. Subsequently this is due to the amount of blood loss that he has had acutely. Again I do not believe he is at transfusion stage but at the same time he is extremely weak and states that a couple times when he is going to stand up he is actually fallen back into his chair. Nonetheless I do believe that  he probably needs to supplement with iron and I did recommend that there are some over-the-counter products he could get in this regard. Subsequently also suggested that the patient needs to be drinking plenty of water he apparently drinks Coke and he drinks coffee but that is about it. Subsequently following this conversation his wife was present during this time as well and I do think that he is going to try to drink more I think it is of utmost importance. With that being said I did review his PCR culture as well it was positive for 2 organisms. This was Pseudomonas and Enterobacter. Both were showing up as being very prevalent in the PCR culture and subsequently Cipro will take care of the situation which is what I am recommending at this point based on what we see. This will take the place of the Bactrim and can have him discontinue the Bactrim the only thing is he is getting need to stop taking the hydroxyzine if he has been taking it that is the Vistaril and he tells me that he only takes this when he itches he has not been taking it recently. Objective Constitutional Well-nourished and well-hydrated in no acute distress. Vitals Time Taken: 8:52 AM, Height: 66 in, Weight: 184 lbs, BMI: 29.7, Temperature: 98.4 F, Pulse: 86 bpm, Respiratory Rate: 18 breaths/min, Blood Pressure: 150/70 mmHg. Respiratory normal breathing without difficulty. Psychiatric this patient is able  to make decisions and demonstrates good insight into disease process. Alert and Oriented x 3. pleasant and cooperative. General Notes: Upon inspection patient's wound actually appears to be doing better I do believe the [facility] which she did finally get actually has been helping to soften this up and that is good news. With that being said I do not see any evidence of infection systemically which is great news but locally he still shows signs of left leg cellulitis and swelling. I do believe that the change in antibiotic is  good to help regain me taking care of the Pseudomonas which was prevalent and not managed with the Bactrim. Integumentary (Hair, Skin) Wound #6 status is Open. Original cause of wound was Gradually Appeared. The date acquired was: 07/25/2021. The wound has been in treatment 3 weeks. The wound is located on the Left,Lateral Foot. The wound measures 6.5cm length x 5cm width x 0.2cm depth; 25.525cm^2 area and 5.105cm^3 volume. There is Fat Layer (Subcutaneous Tissue) exposed. There is no tunneling or undermining noted. There is a medium amount of purulent drainage noted. The wound margin is distinct with the outline attached to the wound base. There is small (1-33%) red granulation within the wound bed. There is a large (67-100%) amount of necrotic tissue within the wound bed including Adherent Slough. Wound #7 status is Open. Original cause of wound was Gradually Appeared. The date acquired was: 06/20/2021. The wound has been in treatment 3 weeks. The wound is located on the Right,Lateral Foot. The wound measures 0.3cm length x 0.3cm width x 0.2cm depth; 0.071cm^2 area and 0.014cm^3 volume. There is Fat Layer (Subcutaneous Tissue) exposed. There is no tunneling or undermining noted. There is a medium amount of serosanguineous drainage noted. The wound margin is distinct with the outline attached to the wound base. There is medium (34-66%) pink granulation within the wound bed. There is a medium (34- 66%) amount of necrotic tissue within the wound bed including Adherent Slough. 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 There was a Chemical/Enzymatic/Mechanical debridement performed by [name], PA. after achieving pain control using Other (Benzocaine). Agent used was [employer]. A time out was conducted at 09:45, prior to the start of the procedure. There was no bleeding. The procedure was tolerated well. Post Debridement Measurements: 6.5cm length x 5cm width x 0.2cm depth; 5.105cm^3 volume. Character of Wound/Ulcer Post Debridement is stable. 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 There was a Chemical/Enzymatic/Mechanical debridement performed by [name], PA. after achieving pain control using Other (Benzocaine). Agent used was [employer]. A time out was conducted at 09:45, prior to the start of the procedure. There was no bleeding. The procedure was tolerated well. Post Debridement Measurements: 0.3cm length x 0.3cm width x 0.2cm depth; 0.014cm^3 volume. Character of Wound/Ulcer Post Debridement is stable. Post procedure Diagnosis Wound #7: Same as Pre-Procedure Plan Follow-up Appointments: Return Appointment in 1 week. - 11/16/21 @ 9:30 am with Bernette Redbird, Room 7) Other: - -Prescription for Cipro  sent to your pharmacy, take as directed. Do not take with Hydroxyzine (Vistaril) -We recommend taking an Iron Supplement Bathing/ Shower/ Hygiene: May shower and wash wound with soap and water. - wash with antibacterial soap when changing dressing Edema Control - Lymphedema / SCD / Other: Elevate legs to the level of the heart or above for 30 minutes daily and/or when sitting, a frequency of: - throughout the day Patient to wear own compression stockings every day. - Use stockings daily to right leg Moisturize legs daily. - Eucerin (in jar) Additional Orders / Instructions: Follow Nutritious Diet The  following medication(s) was prescribed: Cipro oral 500 mg tablet 1 1 tablet oral taken 2 times per day for 21 days. Do not take hydroxyzine with this medication starting 11/09/2021 WOUND #6: - Foot Wound Laterality: Left, Lateral Cleanser: Soap and Water 1 x Per Day/30 Days Discharge Instructions: May shower and wash wound with dial antibacterial soap and water prior to dressing change. Prim Dressing: Santyl Ointment 1 x Per Day/30 Days ary Discharge Instructions: Apply nickel thick amount to wound bed as instructed Secondary Dressing: ABD Pad, 5x9 1 x Per Day/30 Days Discharge Instructions: Apply over primary dressing as directed. Secondary Dressing: Woven Gauze Sponge, Non-Sterile 4x4 in 1 x Per Day/30 Days Discharge Instructions: Apply over primary dressing as directed. Secured With: [employer], 4.5x3.1 (in/yd) 1 x Per Day/30 Days Discharge Instructions: Secure with Kerlix as directed. Secured With: 70M Medipore H Soft Cloth Surgical T ape, 4 x 10 (in/yd) 1 x Per Day/30 Days Discharge Instructions: Secure with tape as directed. Com pression Wrap: ACE Wrap 1 x Per Day/30 Days WOUND #7: - Foot Wound Laterality: Right, Lateral Cleanser: Soap and Water 1 x Per Day/30 Days Discharge Instructions: May shower and wash wound with dial antibacterial soap and water prior to dressing change. Prim Dressing: Santyl Ointment 1 x Per Day/30 Days ary Discharge Instructions: Apply nickel thick amount to wound bed as instructed Secondary Dressing: ALLEVYN Gentle Border, 5x5 (in/in) 1 x Per Day/30 Days Discharge Instructions: Apply over primary dressing as directed. Secondary Dressing: Woven Gauze Sponges 2x2 in 1 x Per Day/30 Days Discharge Instructions: Apply over primary dressing as directed. 1. I would recommend currently that we go and continue with the wound care measures as before and the patient is in agreement with plan. This includes the use of the Santyl which I still think is good to  be his best option. 2. I am also can recommend that we have the patient continue with the Allevyn to cover which I think is doing well. 3. I am also going to suggest these to be using the Ace wraps from the toes up to the knee to keep the edema under control this is going to help with getting the wound better. 4. I am also going to recommend he needs to pick up some iron supplements due to his acute blood loss he is anemic at this point and that is making him very tired coupled with this he needs to be drinking plenty of fluids specifically water in order to rehydrate and prevent issues as well. We will see patient back for reevaluation in 1 week here in the clinic. If anything worsens or changes patient will contact our office for additional recommendations. Electronic Signature(s) Signed: 11/09/2021 2:05:31 PM By: [name] PA-C Entered By: [name] on 11/09/2021 14:05:31 -------------------------------------------------------------------------------- SuperBill Details Patient Name: Date of Service: [place], [name] 11/09/2021 Medical Record Number: [id number]  Patient Account Number: [id number] Date of Birth/Sex: Treating RN: 04-Sep-1940 (81 y.o. 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 L97.522 Non-pressure chronic ulcer of other part of left foot with fat layer exposed L97.512 Non-pressure chronic ulcer of other part of right foot with fat layer exposed E11.622 Type 2 diabetes mellitus with other skin ulcer Facility Procedures CPT4 Code: [id number] Description: [phone] - DEBRIDE W/O ANES NON SELECT  ICD-10 Diagnosis Description L97.522 Non-pressure chronic ulcer of other part of left foot with fat layer exposed L97.512 Non-pressure chronic ulcer of other part of right foot with fat layer exposed Modifier: Quantity: 1 Physician Procedures : CPT4 Code Description Modifier [id number] - WC PHYS LEVEL 4 - EST PT ICD-10 Diagnosis Description I87.333 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 extremity I89.0 Lymphedema, not elsewhere classified  L95.8 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

## 2021-11-09 NOTE — Progress Notes (Signed)
WILFRIDO, LUEDKE ([id number]) Visit Report for 11/09/2021 Arrival Information Details Patient Name: Date of Service: [place], [name] 11/09/2021 8:45 A M Medical Record Number: [id number] Patient Account Number: [id number] Date of Birth/Sex: Treating RN: 01-07-1941 (81 y.o. [name] Primary Care Vivica Dobosz: [name] Other Clinician: Referring Rhealyn Cullen: Treating Mikle Sternberg/Extender: Azucena Kuba in Treatment: 3 Visit Information History Since Last Visit Added or deleted any medications: No Patient Arrived: [name] Any new allergies or adverse reactions: No Arrival Time: 08:45 Had a fall or experienced change in No Accompanied By: wife activities of daily living that may affect Transfer Assistance: None risk of falls: Patient Identification Verified: Yes Signs or symptoms of abuse/neglect since last visito No Secondary Verification Process Completed: Yes Hospitalized since last visit: No Patient Requires Transmission-Based Precautions: No Implantable device outside of the clinic excluding No Patient Has Alerts: No cellular tissue based products placed in the center since last visit: Has Dressing in Place as Prescribed: Yes Has Compression in Place as Prescribed: Yes Pain Present Now: Yes Electronic Signature(s) Signed: 11/09/2021 5:07:28 PM By: [name] Entered By: [name] on 11/09/2021 08:50:16 -------------------------------------------------------------------------------- Encounter Discharge Information Details Patient Name: Date of Service: [place], [name] 11/09/2021 8:45 A M Medical Record Number: [id number] Patient Account Number: [id number] Date of Birth/Sex: Treating RN: 1940-08-31 (81 y.o. [name] Primary Care Elazar Argabright: [name] Other Clinician: Referring Kae Lauman: Treating Gar Glance/Extender: Azucena Kuba in Treatment: 3 Encounter Discharge Information Items Post  Procedure Vitals Discharge Condition: Stable Temperature (F): 98.4 Ambulatory Status: Cane Pulse (bpm): 86 Discharge Destination: Home Respiratory Rate (breaths/min): 18 Transportation: Private Auto Blood Pressure (mmHg): 150/70 Accompanied By: spouse Schedule Follow-up Appointment: Yes Clinical Summary of Care: Provided on 11/09/2021 Form Type Recipient Paper Patient Patient Electronic Signature(s) Signed: 11/09/2021 5:07:28 PM By: [name] Entered By: [name] on 11/09/2021 10:21:13 -------------------------------------------------------------------------------- Lower Extremity Assessment Details Patient Name: Date of Service: [place], [place] [name] 11/09/2021 8:45 A M Medical Record Number: [id number] Patient Account Number: [id number] Date of Birth/Sex: Treating RN: [birth year] (81 y.o. [name] Primary Care Shaakira Borrero: [name] Other Clinician: Referring Sydney Azure: Treating Lolah Coghlan/Extender: Azucena Kuba in Treatment: 3 Edema Assessment Assessed: [Left: Yes] Patrice Paradise: Yes] Edema: [Left: Yes] [Right: Yes] Calf Left: Right: Point of Measurement: 32 cm From Medial Instep 41.5 cm 37.4 cm Ankle Left: Right: Point of Measurement: 10 cm From Medial Instep 27 cm 25 cm Vascular Assessment Pulses: Dorsalis Pedis Palpable: [Left:Yes] [Right:Yes] Electronic Signature(s) Signed: 11/09/2021 5:07:28 PM By: [name] Entered By: [name] on 11/09/2021 08:58:11 -------------------------------------------------------------------------------- Multi-Disciplinary Care Plan Details Patient Name: Date of Service: [place], [name] 11/09/2021 8:45 A M Medical Record Number: [id number] Patient Account Number: [id number] Date of Birth/Sex: Treating RN: 06-30-1940 (81 y.o. 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 Compression as ordered Notes: Wound/Skin Impairment Nursing Diagnoses: Impaired tissue integrity Goals: Patient/caregiver will verbalize understanding of skin care regimen Date Initiated: 10/19/2021 Target Resolution Date: 11/16/2021 Goal Status: Active Ulcer/skin breakdown will have a volume reduction of 30% by week 4 Date Initiated:  10/19/2021 Target Resolution Date: 11/16/2021 Goal Status: Active Interventions: Assess patient/caregiver ability to obtain necessary supplies Assess patient/caregiver ability to perform ulcer/skin care regimen upon admission and as needed Assess ulceration(s) every visit Provide education on ulcer and skin care Treatment Activities: Topical wound management initiated : 10/19/2021 Notes: Electronic Signature(s) Signed: 11/09/2021 5:07:28 PM By: [name] Entered By: [name] on 11/09/2021 08:45:14 -------------------------------------------------------------------------------- Pain Assessment Details Patient Name: Date of Service: [name], [name] 11/09/2021 8:45 A M Medical Record Number: [id number] Patient Account Number: [id number] Date of Birth/Sex: Treating RN: Feb 28, 1941 (81 y.o. [name] Primary Care Giovanni Bath: [name] Other Clinician: Referring Sakiyah Shur: Treating Merian Wroe/Extender: Azucena Kuba in Treatment: 3 Active Problems Location of Pain Severity and Description of Pain Patient Has Paino Yes Site Locations Pain Location: Pain in Ulcers With Dressing Change: Yes Duration of the Pain. Constant / Intermittento Intermittent Rate the  pain. Current Pain Level: 6 Character of Pain Describe the Pain: Aching, Burning, Throbbing Pain Management and Medication Current Pain Management: Medication: Yes Cold Application: No Rest: Yes Massage: No Activity: No T.E.N.S.: No Heat Application: No Leg drop or elevation: No Is the Current Pain Management Adequate: Adequate How does your wound impact your activities of daily livingo Sleep: No Bathing: No Appetite: No Relationship With Others: No Bladder Continence: No Emotions: No Bowel Continence: No Work: No Toileting: No Drive: No Dressing: No Hobbies: No Electronic Signature(s) Signed: 11/09/2021 5:07:28 PM By: [name] Entered By: [name] on 11/09/2021 08:50:44 -------------------------------------------------------------------------------- Patient/Caregiver Education Details Patient Name: Date of Service: [place] [name] 5/31/2023andnbsp8:45 A M Medical Record Number: [id number] Patient Account Number: [id number] Date of Birth/Gender: Treating RN: [birth year] (81 y.o. [name] Primary Care Physician: [name] Other Clinician: Referring Physician: Treating Physician/Extender: Azucena Kuba in Treatment: 3 Education Assessment Education Provided To: Patient Education Topics Provided Infection: Methods: Explain/Verbal Responses: State content correctly Venous: Methods: Explain/Verbal, Printed Responses: State content correctly Wound/Skin Impairment: Methods: Explain/Verbal, Printed Responses: State content correctly Electronic Signature(s) Signed: 11/09/2021 5:07:28 PM By: [name] Entered By: [name] on 11/09/2021 08:45:42 -------------------------------------------------------------------------------- Wound Assessment Details Patient Name: Date of Service: [name], [name] 11/09/2021 8:45 A M Medical Record Number: [id number] Patient Account Number: [id number] Date of  Birth/Sex: Treating RN: 1940/07/04 (81 y.o. [name] Primary Care Arshdeep Bolger: [name] Other Clinician: Referring Clarabelle Oscarson: Treating Chick Cousins/Extender: Azucena Kuba in Treatment: 3 Wound Status Wound Number: 6 Primary Vasculitis Etiology: Wound Location: Left, Lateral Foot Wound Open Wounding Event: Gradually Appeared Status: Date Acquired: 07/25/2021 Comorbid Anemia, Chronic Obstructive Pulmonary Disease (COPD), Weeks Of Treatment: 3 History: Hypertension, Peripheral Venous Disease, Type II Diabetes, Clustered Wound: No Osteoarthritis, Neuropathy Photos Wound Measurements Length: (cm) 6.5 Width: (cm) 5 Depth: (cm) 0.2 Area: (cm) 25.525 Volume: (cm) 5.105 % Reduction in Area: -31% % Reduction in Volume: -31% Epithelialization: None Tunneling: No Undermining: No Wound Description Classification: Full Thickness Without Exposed Support Structures Wound Margin: Distinct, outline attached Exudate Amount: Medium Exudate Type: Purulent Exudate Color: yellow, brown, green Foul Odor After Cleansing: No Slough/Fibrino Yes Wound Bed Granulation Amount: Small (1-33%) Exposed Structure Granulation Quality: Red Fascia Exposed: No Necrotic Amount: Large (67-100%) Fat Layer (Subcutaneous Tissue) Exposed: Yes Necrotic Quality: Adherent Slough Tendon Exposed: No Muscle Exposed: No Joint Exposed: No Bone Exposed: No Treatment Notes Wound #6 (Foot) Wound Laterality: Left, Lateral Cleanser Soap and Water Discharge Instruction: May shower  and wash wound with dial antibacterial soap and water prior to dressing change. Peri-Wound Care Topical Primary Dressing Santyl Ointment Discharge Instruction: Apply nickel thick amount to wound bed as instructed Secondary Dressing ABD Pad, 5x9 Discharge Instruction: Apply over primary dressing as directed. Woven Gauze Sponge, Non-Sterile 4x4 in Discharge Instruction: Apply over primary  dressing as directed. Secured With [employer], 4.5x3.1 (in/yd) Discharge Instruction: Secure with Kerlix as directed. 30M Medipore H Soft Cloth Surgical Tape, 4 x 10 (in/yd) Discharge Instruction: Secure with tape as directed. Compression Wrap ACE Wrap Compression Stockings Add-Ons Electronic Signature(s) Signed: 11/09/2021 5:07:28 PM By: [name] Entered By: [name] on 11/09/2021 09:03:13 -------------------------------------------------------------------------------- Wound Assessment Details Patient Name: Date of Service: [place], [name] 11/09/2021 8:45 A M Medical Record Number: [id number] Patient Account Number: [id number] Date of Birth/Sex: Treating RN: 1940-10-26 (81 y.o. [name] Primary Care Anihya Tuma: [name] Other Clinician: Referring Khyrie Masi: Treating Azlyn Wingler/Extender: Azucena Kuba in Treatment: 3 Wound Status Wound Number: 7 Primary Vasculitis Etiology: Wound Location: Right, Lateral Foot Wound Open Wounding Event: Gradually Appeared Status: Date Acquired: 06/20/2021 Comorbid Anemia, Chronic Obstructive Pulmonary Disease (COPD), Weeks Of Treatment: 3 History: Hypertension, Peripheral Venous Disease, Type II Diabetes, Clustered Wound: No Osteoarthritis, Neuropathy Photos Wound Measurements Length: (cm) 0.3 Width: (cm) 0.3 Depth: (cm) 0.2 Area: (cm) 0.071 Volume: (cm) 0.014 % Reduction in Area: 24.5% % Reduction in Volume: 50% Epithelialization: Small (1-33%) Tunneling: No Undermining: No Wound Description Classification: Full Thickness Without Exposed Support Structures Wound Margin: Distinct, outline attached Exudate Amount: Medium Exudate Type: Serosanguineous Exudate Color: red, brown Foul Odor After Cleansing: No Slough/Fibrino Yes Wound Bed Granulation Amount: Medium (34-66%) Exposed Structure Granulation Quality: Pink Fascia Exposed: No Necrotic Amount: Medium  (34-66%) Fat Layer (Subcutaneous Tissue) Exposed: Yes Necrotic Quality: Adherent Slough Tendon Exposed: No Muscle Exposed: No Joint Exposed: No Bone Exposed: No Treatment Notes Wound #7 (Foot) Wound Laterality: Right, Lateral Cleanser Soap and Water Discharge Instruction: May shower and wash wound with dial antibacterial soap and water prior to dressing change. Peri-Wound Care Topical Primary Dressing Santyl Ointment Discharge Instruction: Apply nickel thick amount to wound bed as instructed Secondary Dressing ALLEVYN Gentle Border, 5x5 (in/in) Discharge Instruction: Apply over primary dressing as directed. Woven Gauze Sponges 2x2 in Discharge Instruction: Apply over primary dressing as directed. Secured With Compression Wrap Compression Stockings Environmental education officer) Signed: 11/09/2021 5:07:28 PM By: [name] Entered By: [name] on 11/09/2021 09:04:24 -------------------------------------------------------------------------------- Vitals Details Patient Name: Date of Service: [name], [name] 11/09/2021 8:45 A M Medical Record Number: [id number] Patient Account Number: [id number] Date of Birth/Sex: Treating RN: 1941/05/19 (81 y.o. [name] Primary Care Breeley Bischof: [name] Other Clinician: Referring Clifford Coudriet: Treating Ayaka Andes/Extender: Azucena Kuba in Treatment: 3 Vital Signs Time Taken: 08:52 Temperature (F): 98.4 Height (in): 66 Pulse (bpm): 86 Weight (lbs): 184 Respiratory Rate (breaths/min): 18 Body Mass Index (BMI): 29.7 Blood Pressure (mmHg): 150/70 Reference Range: 80 - 120 mg / dl Electronic Signature(s) Signed: 11/09/2021 5:07:28 PM By: [name] Entered By: [name] on 11/09/2021 08:52:51

## 2021-11-16 ENCOUNTER — Encounter (HOSPITAL_BASED_OUTPATIENT_CLINIC_OR_DEPARTMENT_OTHER): Payer: Medicare HMO | Attending: Internal Medicine | Admitting: Internal Medicine

## 2021-11-16 ENCOUNTER — Emergency Department (HOSPITAL_BASED_OUTPATIENT_CLINIC_OR_DEPARTMENT_OTHER)
Admission: EM | Admit: 2021-11-16 | Discharge: 2021-11-16 | Disposition: A | Discharge status: Home / Self Care | Payer: Medicare HMO | Attending: Emergency Medicine | Admitting: Emergency Medicine

## 2021-11-16 ENCOUNTER — Emergency Department (HOSPITAL_BASED_OUTPATIENT_CLINIC_OR_DEPARTMENT_OTHER): Payer: Medicare HMO | Admitting: Radiology

## 2021-11-16 ENCOUNTER — Other Ambulatory Visit: Payer: Self-pay

## 2021-11-16 ENCOUNTER — Encounter (HOSPITAL_BASED_OUTPATIENT_CLINIC_OR_DEPARTMENT_OTHER): Payer: Self-pay

## 2021-11-16 DIAGNOSIS — I87333 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 extremity: Secondary | ICD-10-CM | POA: Diagnosis not present

## 2021-11-16 DIAGNOSIS — I890 Lymphedema, not elsewhere classified: Secondary | ICD-10-CM | POA: Insufficient documentation

## 2021-11-16 DIAGNOSIS — Z87891 Personal history of nicotine dependence: Secondary | ICD-10-CM | POA: Insufficient documentation

## 2021-11-16 DIAGNOSIS — Z91040 Latex allergy status: Secondary | ICD-10-CM | POA: Insufficient documentation

## 2021-11-16 DIAGNOSIS — L97522 Non-pressure chronic ulcer of other part of left foot with fat layer exposed: Secondary | ICD-10-CM | POA: Insufficient documentation

## 2021-11-16 DIAGNOSIS — S43401A Unspecified sprain of right shoulder joint, initial encounter: Secondary | ICD-10-CM

## 2021-11-16 DIAGNOSIS — L97512 Non-pressure chronic ulcer of other part of right foot with fat layer exposed: Secondary | ICD-10-CM | POA: Diagnosis not present

## 2021-11-16 DIAGNOSIS — E11622 Type 2 diabetes mellitus with other skin ulcer: Secondary | ICD-10-CM | POA: Diagnosis not present

## 2021-11-16 DIAGNOSIS — S4991XA Unspecified injury of right shoulder and upper arm, initial encounter: Secondary | ICD-10-CM | POA: Diagnosis present

## 2021-11-16 DIAGNOSIS — M25511 Pain in right shoulder: Secondary | ICD-10-CM | POA: Diagnosis not present

## 2021-11-16 DIAGNOSIS — L958 Other vasculitis limited to the skin: Secondary | ICD-10-CM | POA: Insufficient documentation

## 2021-11-16 DIAGNOSIS — M79621 Pain in right upper arm: Secondary | ICD-10-CM | POA: Diagnosis not present

## 2021-11-16 DIAGNOSIS — S4391XA Sprain of unspecified parts of right shoulder girdle, initial encounter: Secondary | ICD-10-CM | POA: Insufficient documentation

## 2021-11-16 DIAGNOSIS — X58XXXA Exposure to other specified factors, initial encounter: Secondary | ICD-10-CM | POA: Diagnosis not present

## 2021-11-16 NOTE — ED Notes (Signed)
Pt brought back to room, visitor upset they were brought to a room, stated, "we just want the test results and go home"  Pt refusing to be placed on monitor.  Family member then requesting to be seen by physician in the next 10 mins or they will have to leave

## 2021-11-16 NOTE — ED Provider Notes (Signed)
[facility] EMERGENCY DEPT Provider Note   CSN: [id number] Arrival date & time: 11/16/21  1111     History  No chief complaint on file.   KASIR HALLENBECK is a 81 y.o. male.  81 year old male presents with injury to right shoulder which occurred several days ago.  Patient states he fell the bed onto the shoulder.  Denies any head or neck trauma from this.  Pain characterizes sharp and worse with movement.  No treatment use prior to arrival      Home Medications Prior to Admission medications   Medication Sig Start Date End Date Taking? Authorizing Provider  Ascorbic Acid (VITAMIN C) 1000 MG tablet Take 1,000 mg by mouth daily.    [provider]  Cholecalciferol (VITAMIN D PO) Take 5,000 Int'l Units by mouth daily.    [provider]  clotrimazole-betamethasone (LOTRISONE) cream APPLY TO AFFECTED AREA TWICE A DAY 10/24/21   Alycia Rossetti, NP  COD LIVER OIL PO Take by mouth.    [provider]  gabapentin (NEURONTIN) 600 MG tablet Take 1/2 to 1 tablet 2 to 3 x /Daily as needed for Chronic Pain 10/24/21   Unk Pinto, MD  hydrOXYzine (ATARAX) 25 MG tablet TAKE 1 TABLET BY MOUTH 3 TIMES DAILY AS NEEDED FOR ITCHING. 10/24/21   Alycia Rossetti, NP  omeprazole (PRILOSEC) 20 MG capsule Take 1 capsule (20 mg total) by mouth daily. 04/30/12   Pyrtle, Lajuan Lines, MD  Red Yeast Rice 600 MG CAPS PRN 02/26/21   Unk Pinto, MD  Sennosides (SENOKOT PO) Take by mouth as needed.    [provider]  triamcinolone cream (KENALOG) 0.5 % Apply 1 application topically 2 (two) times daily. 08/11/20   Unk Pinto, MD  triamterene-hydrochlorothiazide (MAXZIDE-25) 37.5-25 MG tablet Take 1 tablet by mouth daily. 09/14/21 09/14/22  Alycia Rossetti, NP  Zinc 50 MG TABS Take by mouth.    [provider]      Allergies    Feraheme [ferumoxytol], Naprosyn [naproxen], Latex, Levothyroxine, and Doxycycline    Review of Systems   Review of  Systems  All other systems reviewed and are negative.  Physical Exam Updated Vital Signs BP (!) 143/75   Pulse 72   Temp 98.2 F (36.8 C)   Resp 18   Ht 1.689 m (5' 6.5")   Wt 83.9 kg   SpO2 100%   BMI 29.41 kg/m  Physical Exam Vitals and nursing note reviewed.  Constitutional:      General: He is not in acute distress.    Appearance: Normal appearance. He is well-developed. He is not toxic-appearing.  HENT:     Head: Normocephalic and atraumatic.  Eyes:     General: Lids are normal.     Conjunctiva/sclera: Conjunctivae normal.     Pupils: Pupils are equal, round, and reactive to light.  Neck:     Thyroid: No thyroid mass.     Trachea: No tracheal deviation.  Cardiovascular:     Rate and Rhythm: Normal rate and regular rhythm.     Heart sounds: Normal heart sounds. No murmur heard.   No gallop.  Pulmonary:     Effort: Pulmonary effort is normal. No respiratory distress.     Breath sounds: Normal breath sounds. No stridor. No decreased breath sounds, wheezing, rhonchi or rales.  Abdominal:     General: There is no distension.     Palpations: Abdomen is soft.     Tenderness: There is no abdominal  tenderness. There is no rebound.  Musculoskeletal:     Right shoulder: Tenderness present. No deformity. Decreased range of motion.     Cervical back: Normal range of motion and neck supple.  Skin:    General: Skin is warm and dry.     Findings: No abrasion or rash.  Neurological:     Mental Status: He is alert and oriented to person, place, and time. Mental status is at baseline.     GCS: GCS eye subscore is 4. GCS verbal subscore is 5. GCS motor subscore is 6.     Cranial Nerves: No cranial nerve deficit.     Sensory: No sensory deficit.     Motor: Motor function is intact.  Psychiatric:        Attention and Perception: Attention normal.        Speech: Speech normal.        Behavior: Behavior normal.    ED Results / Procedures / Treatments   Labs (all labs ordered  are listed, but only abnormal results are displayed) Labs Reviewed - No data to display  EKG None  Radiology DG Shoulder Right  Result Date: 11/16/2021 CLINICAL DATA:  fall and pain at the right shoulder and right arm EXAM: RIGHT HUMERUS - 2+ VIEW; RIGHT SHOULDER - 2+ VIEW COMPARISON:  None Available. FINDINGS: There is no evidence of fracture or other focal bone lesions. Soft tissues are unremarkable. Osteopenia. Mild arthrosis at the shoulder joint. IMPRESSION: No fracture or dislocation. 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 Osteopenia. Electronically Signed   By: Frazier Richards M.D.   On: 11/16/2021 12:14   DG Humerus Right  Result Date: 11/16/2021 CLINICAL DATA:  fall and pain at the right shoulder and right arm EXAM: RIGHT HUMERUS - 2+ VIEW; RIGHT SHOULDER - 2+ VIEW COMPARISON:  None Available. FINDINGS: There is no evidence of fracture or other focal bone lesions. Soft tissues are unremarkable. Osteopenia. Mild arthrosis at the shoulder joint. IMPRESSION: No fracture or dislocation. 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 Osteopenia. Electronically Signed   By: Frazier Richards M.D.   On: 11/16/2021 12:14    Procedures Procedures    Medications Ordered in ED Medications - No data to display  ED Course/ Medical Decision Making/ A&P                           Medical Decision Making Amount and/or Complexity of Data Reviewed Radiology: ordered.   X-ray of patient's right shoulder and forearm were reviewed and interpreted by myself which showed no evidence of fracture or dislocation.  Patient placed in sling for comfort.  Patient's orthopedic referral scheduled for next week.        Final Clinical Impression(s) / ED Diagnoses Final diagnoses:  Sprain of right shoulder, unspecified shoulder sprain type, initial encounter    Rx / DC Orders ED Discharge Orders     None         Lacretia Leigh, MD 11/16/21 1305

## 2021-11-16 NOTE — ED Triage Notes (Signed)
Pt c/o right shoulder and upper arm pain. Pt states he fell off his bed on Sunday

## 2021-11-16 NOTE — Discharge Instructions (Signed)
Follow-up with Dr. Onnie Graham as you have been instructed

## 2021-11-16 NOTE — Progress Notes (Signed)
JONOVAN, BOEDECKER ([id number]) Visit Report for 11/16/2021 Arrival Information Details Patient Name: Date of Service: [place], [name] 11/16/2021 9:30 A M Medical Record Number: [id number] Patient Account Number: [id number] Date of Birth/Sex: Treating RN: 04-28-1941 (81 y.o. [name] Primary Care Jaylea Plourde: [name] Other Clinician: Referring Andrian Sabala: Treating Ranyah Groeneveld/Extender: Glean Hess in Treatment: 4 Visit Information History Since Last Visit Added or deleted any medications: No Patient Arrived: [name] Any new allergies or adverse reactions: No Arrival Time: 09:32 Had a fall or experienced change in No Accompanied By: wife activities of daily living that may affect Transfer Assistance: None risk of falls: Patient Identification Verified: Yes Signs or symptoms of abuse/neglect since last visito No Secondary Verification Process Completed: Yes Hospitalized since last visit: No Patient Requires Transmission-Based Precautions: No Implantable device outside of the clinic excluding No Patient Has Alerts: No cellular tissue based products placed in the center since last visit: Has Dressing in Place as Prescribed: Yes Has Compression in Place as Prescribed: No Pain Present Now: Yes Electronic Signature(s) Signed: 11/16/2021 12:13:52 PM By: [name] Entered By: [name] on 11/16/2021 09:36:45 -------------------------------------------------------------------------------- Compression Therapy Details Patient Name: Date of Service: [name], [name] 11/16/2021 9:30 A M Medical Record Number: [id number] Patient Account Number: [id number] Date of Birth/Sex: Treating RN: 1940/07/20 (81 y.o. [name] Primary Care Mamie Hundertmark: [name] Other Clinician: Referring Dream Harman: Treating Nandi Tonnesen/Extender: Glean Hess in Treatment: 4 Compression Therapy Performed for Wound Assessment: Wound #6  Left,Lateral Foot Performed By: Clinician [name], RN Compression Type: Three Layer Post Procedure Diagnosis Same as Pre-procedure Electronic Signature(s) Signed: 11/16/2021 12:13:52 PM By: [name] Entered By: [name] on 11/16/2021 10:18:11 -------------------------------------------------------------------------------- Encounter Discharge Information Details Patient Name: Date of Service: [place], [name] 11/16/2021 9:30 A M Medical Record Number: [id number] Patient Account Number: [id number] Date of Birth/Sex: Treating RN: [birth year] (81 y.o. [name] Primary Care Nihaal Friesen: [name] Other Clinician: Referring Zalaya Astarita: Treating Keyron Pokorski/Extender: Glean Hess in Treatment: 4 Encounter Discharge Information Items Post Procedure Vitals Discharge Condition: Stable Temperature (F): 98.3 Ambulatory Status: Ambulatory Pulse (bpm): 76 Discharge Destination: Home Respiratory Rate (breaths/min): 18 Transportation: Private Auto Blood Pressure (mmHg): 157/73 Accompanied By: wife Schedule Follow-up Appointment: Yes Clinical Summary of Care: Provided on 11/16/2021 Form Type Recipient Paper Patient Patient Electronic Signature(s) Signed: 11/16/2021 11:55:40 AM By: [name] Entered By: [name] on 11/16/2021 11:55:40 -------------------------------------------------------------------------------- Lower Extremity Assessment Details Patient Name: Date of Service: [place], [name] 11/16/2021 9:30 A M Medical Record Number: [id number] Patient Account Number: [id number] Date of Birth/Sex: Treating RN: November 12, 1940 (81 y.o. [name] Primary Care Traevion Poehler: [name] Other Clinician: Referring Elizette Shek: Treating Bess Saltzman/Extender: Glean Hess in Treatment: 4 Edema Assessment Assessed: Shirlyn Goltz: Yes] Patrice Paradise: Yes] Edema: [Left: Yes] [Right: Yes] Calf Left: Right: Point  of Measurement: 32 cm From Medial Instep 46 cm 43 cm Ankle Left: Right: Point of Measurement: 10 cm From Medial Instep 26.5 cm 26.4 cm Vascular Assessment Pulses: Dorsalis Pedis Palpable: [Left:Yes] [Right:Yes] Notes Increased edema, patient not compliant with compression. Electronic Signature(s) Signed: 11/16/2021 12:13:52 PM By: [name] Entered By: [name] on 11/16/2021 09:40:38 -------------------------------------------------------------------------------- Multi Wound Chart Details Patient Name: Date of Service: [name], [name] 11/16/2021 9:30 A M Medical Record Number: [id number] Patient Account Number: [id number] Date of Birth/Sex: Treating RN: 1940/10/25 (81 y.o. [name]  Primary Care Amarri Satterly: [name] Other Clinician: Referring Dorisann Schwanke: Treating Asar Evilsizer/Extender: Glean Hess in Treatment: 4 Vital Signs Height(in): 12 Pulse(bpm): 23 Weight(lbs): 184 Blood Pressure(mmHg): 157/73 Body Mass Index(BMI): 29.7 Temperature(F): 98.3 Respiratory Rate(breaths/min): 18 Photos: [N/A:N/A] Left, Lateral Foot Right, Lateral Foot N/A Wound Location: Gradually Appeared Gradually Appeared N/A Wounding Event: Vasculitis Vasculitis N/A Primary Etiology: Anemia, Chronic Obstructive Anemia, Chronic Obstructive N/A Comorbid History: Pulmonary Disease (COPD), Pulmonary Disease (COPD), Hypertension, Peripheral Venous Hypertension, Peripheral Venous Disease, Type II Diabetes, Disease, Type II Diabetes, Osteoarthritis, Neuropathy Osteoarthritis, Neuropathy 07/25/2021 06/20/2021 N/A Date Acquired: 4 4 N/A Weeks of Treatment: Open Open N/A Wound Status: No No N/A Wound Recurrence: 7x5.2x0.2 0.3x0.3x0.1 N/A Measurements L x W x D (cm) 28.588 0.071 N/A A (cm) : rea 5.718 0.007 N/A Volume (cm) : -46.80% 24.50% N/A % Reduction in A rea: -46.80% 75.00% N/A % Reduction in Volume: Full Thickness Without Exposed Full  Thickness Without Exposed N/A Classification: Support Structures Support Structures Medium Medium N/A Exudate A mount: Purulent Serosanguineous N/A Exudate Type: yellow, brown, green red, brown N/A Exudate Color: Distinct, outline attached Distinct, outline attached N/A Wound Margin: Small (1-33%) Large (67-100%) N/A Granulation A mount: Red Pink, Pale N/A Granulation Quality: Large (67-100%) Small (1-33%) N/A Necrotic A mount: Fat Layer (Subcutaneous Tissue): Yes Fat Layer (Subcutaneous Tissue): Yes N/A Exposed Structures: Fascia: No Fascia: No Tendon: No Tendon: No Muscle: No Muscle: No Joint: No Joint: No Bone: No Bone: No None Small (1-33%) N/A Epithelialization: Debridement - Selective/Open Wound Chemical/Enzymatic/Mechanical N/A Debridement: Pre-procedure Verification/Time Out 10:02 10:05 N/A Taken: Other N/A N/A Pain Control: Slough N/A N/A Tissue Debrided: Non-Viable Tissue N/A N/A Level: 36.4 N/A N/A Debridement A (sq cm): rea Curette N/A N/A Instrument: Minimum None N/A Bleeding: Pressure N/A N/A Hemostasis A chieved: Procedure was tolerated well Procedure was tolerated well N/A Debridement Treatment Response: Post Debridement Measurements L x 7x5.2x0.2 0.3x0.3x0.1 N/A W x D (cm) 5.718 0.007 N/A Post Debridement Volume: (cm) Compression Therapy Debridement N/A Procedures Performed: Debridement Treatment Notes Electronic Signature(s) Signed: 11/16/2021 12:13:52 PM By: [name] Signed: 11/16/2021 5:09:19 PM By: Linton Ham MD Entered By: Linton Ham on 11/16/2021 10:27:44 -------------------------------------------------------------------------------- Multi-Disciplinary Care Plan Details Patient Name: Date of Service: [place], [name] 11/16/2021 9:30 A M Medical Record Number: [id number] Patient Account Number: [id number] Date of Birth/Sex: Treating RN: 02-28-1941 (81 y.o. 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 Compression as ordered Notes: 11/16/21: Edema not controlled, patient not compliant with compression. Wound/Skin Impairment Nursing Diagnoses: Impaired tissue integrity Goals: Patient/caregiver will verbalize understanding of skin care regimen Date Initiated: 10/19/2021 Target Resolution Date: 12/14/2021 Goal Status: Active Ulcer/skin breakdown will have a volume reduction of 30% by week 4 Date Initiated: 10/19/2021 Target Resolution Date: 12/14/2021 Goal Status: Active Interventions: Assess patient/caregiver ability to obtain necessary supplies Assess patient/caregiver ability to perform ulcer/skin care regimen upon admission and as needed Assess ulceration(s) every visit Provide education on ulcer and skin care Treatment Activities: Topical wound management initiated : 10/19/2021 Notes: 11/16/21: Wound care continues, patient not compliant with edema control. Electronic Signature(s) Signed: 11/16/2021 12:13:52 PM By: [name] Entered By: [name] on 11/16/2021  09:51:13 -------------------------------------------------------------------------------- Pain Assessment Details Patient Name: Date of Service: [place], [place] LD C. 11/16/2021 9:30 A M Medical Record Number: [id number] Patient Account Number: [id number] Date of Birth/Sex: Treating RN: 1941/04/27 (81 y.o. [name] Primary Care Tay Whitwell: [name] Other Clinician: Referring [place] Throckmorton: Treating [place] Grayer/Extender: Glean Hess in Treatment:  4 Active Problems Location of Pain Severity and Description of Pain Patient Has Paino Yes Site Locations Pain Location: Pain in Ulcers With Dressing Change: Yes Duration of the Pain. Constant / Intermittento Intermittent Rate the pain. Current Pain Level: 6 Character of Pain Describe the Pain: Burning, Throbbing Pain Management and Medication Current Pain Management: Medication: Yes Cold Application: No Rest: Yes Massage: No Activity: No T.E.N.S.: No Heat Application: No Leg drop or elevation: No Is the Current Pain Management Adequate: Adequate How does your wound impact your activities of daily livingo Sleep: Yes Bathing: No Appetite: No Relationship With Others: No Bladder Continence: No Emotions: No Bowel Continence: No Work: No Toileting: No Drive: No Dressing: No Hobbies: No Electronic Signature(s) Signed: 11/16/2021 12:13:52 PM By: [name] Entered By: [name] on 11/16/2021 09:37:42 -------------------------------------------------------------------------------- Patient/Caregiver Education Details Patient Name: Date of Service: [place] [name] 6/7/2023andnbsp9:30 A M Medical Record Number: [id number] Patient Account Number: [id number] Date of Birth/Gender: Treating RN: Mar 14, 1941 (81 y.o. [name] Primary Care Physician: [name] Other Clinician: Referring Physician: Treating Physician/Extender: Glean Hess in Treatment: 4 Education Assessment Education Provided To: Patient Education Topics Provided Venous: Methods: Explain/Verbal, Printed Responses: State content correctly Wound/Skin Impairment: Methods: Explain/Verbal, Printed Responses: State content correctly Electronic Signature(s) Signed: 11/16/2021 12:13:52 PM By: [name] Entered By: [name] on 11/16/2021 09:32:29 -------------------------------------------------------------------------------- Wound Assessment  Details Patient Name: Date of Service: [name], [place] [name] 11/16/2021 9:30 A M Medical Record Number: [id number] Patient Account Number: [id number] Date of Birth/Sex: Treating RN: 07-07-1940 (80 y.o. 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, Lateral Foot Wound Open Wounding Event: Gradually Appeared Status: Date Acquired: 07/25/2021 Comorbid Anemia, Chronic Obstructive Pulmonary Disease (COPD), Weeks Of Treatment: 4 History: Hypertension, Peripheral Venous Disease, Type II Diabetes, Clustered Wound: No Osteoarthritis, Neuropathy Photos Wound Measurements Length: (cm) 7 Width: (cm) 5.2 Depth: (cm) 0.2 Area: (cm) 28.588 Volume: (cm) 5.718 % Reduction in Area: -46.8% % Reduction in Volume: -46.8% Epithelialization: None Tunneling: No Undermining: No Wound Description Classification: Full Thickness Without Exposed Support Struct Wound Margin: Distinct, outline attached Exudate Amount: Medium Exudate Type: Purulent Exudate Color: yellow, brown, green ures Foul Odor After Cleansing: No Slough/Fibrino Yes Wound Bed Granulation Amount: Small (1-33%) Exposed Structure Granulation Quality: Red Fascia Exposed: No Necrotic Amount: Large (67-100%) Fat Layer (Subcutaneous Tissue) Exposed: Yes Necrotic Quality: Adherent Slough Tendon Exposed: No Muscle Exposed: No Joint Exposed: No Bone Exposed: No Treatment Notes Wound #6 (Foot) Wound Laterality: Left, Lateral Cleanser Soap and Water Discharge Instruction: May shower and wash wound with dial antibacterial soap and water prior to dressing change. Peri-Wound Care Triamcinolone 15 (g) Discharge Instruction: Use triamcinolone 15 (g) as directed Sween Lotion (Moisturizing lotion) Discharge Instruction: Apply  moisturizing lotion as directed Topical Gentamicin Discharge Instruction: As directed by physician Primary Dressing IODOFLEX 0.9% Cadexomer Iodine Pad 4x6 cm Discharge Instruction: Apply to wound bed as instructed Secondary Dressing ABD Pad, 5x9 Discharge Instruction: Apply over primary dressing  as directed. Zetuvit Plus 4x8 in Discharge Instruction: Apply over primary dressing as directed. Secured With Compression Wrap ThreePress (3 layer compression wrap) Discharge Instruction: Apply three layer compression as directed. Compression Stockings Add-Ons Electronic Signature(s) Signed: 11/16/2021 12:13:52 PM By: [name] Entered By: [name] on 11/16/2021 09:47:06 -------------------------------------------------------------------------------- Wound Assessment Details Patient Name: Date of Service: [place], [name] 11/16/2021 9:30 A M Medical Record Number: [id number] Patient Account Number: [id number] Date of Birth/Sex: Treating RN: 03/01/1941 (81 y.o. [name] Primary Care Earland Reish: [name] Other Clinician: Referring Zada Haser: Treating Madline Oesterling/Extender: Glean Hess in Treatment: 4 Wound Status Wound Number: 7 Primary Vasculitis Etiology: Wound Location: Right, Lateral Foot Wound Open Wounding Event: Gradually Appeared Status: Date Acquired: 06/20/2021 Comorbid Anemia, Chronic Obstructive Pulmonary Disease (COPD), Weeks Of Treatment: 4 History: Hypertension, Peripheral Venous Disease, Type II Diabetes, Clustered Wound: No Osteoarthritis, Neuropathy Photos Wound Measurements Length: (cm) 0.3 Width: (cm) 0.3 Depth: (cm) 0.1 Area: (cm) 0.071 Volume: (cm) 0.007 % Reduction in Area: 24.5% % Reduction in Volume: 75% Epithelialization: Small (1-33%) Tunneling: No Undermining: No Wound Description Classification: Full Thickness Without Exposed Support Structures Wound Margin: Distinct, outline  attached Exudate Amount: Medium Exudate Type: Serosanguineous Exudate Color: red, brown Foul Odor After Cleansing: No Slough/Fibrino Yes Wound Bed Granulation Amount: Large (67-100%) Exposed Structure Granulation Quality: Pink, Pale Fascia Exposed: No Necrotic Amount: Small (1-33%) Fat Layer (Subcutaneous Tissue) Exposed: Yes Necrotic Quality: Adherent Slough Tendon Exposed: No Muscle Exposed: No Joint Exposed: No Bone Exposed: No Treatment Notes Wound #7 (Foot) Wound Laterality: Right, Lateral Cleanser Soap and Water Discharge Instruction: May shower and wash wound with dial antibacterial soap and water prior to dressing change. Peri-Wound Care Topical Primary Dressing Santyl Ointment Discharge Instruction: Apply nickel thick amount to wound bed as instructed Secondary Dressing ALLEVYN Gentle Border, 5x5 (in/in) Discharge Instruction: Apply over primary dressing as directed. Woven Gauze Sponges 2x2 in Discharge Instruction: Apply over primary dressing as directed. Secured With Compression Wrap Compression Stockings Environmental education officer) Signed: 11/16/2021 12:13:52 PM By: [name] Entered By: [name] on 11/16/2021 09:47:30 -------------------------------------------------------------------------------- Vitals Details Patient Name: Date of Service: [name], [name] 11/16/2021 9:30 A M Medical Record Number: [id number] Patient Account Number: [id number] Date of Birth/Sex: Treating RN: [birth year] (80 y.o. [name] Primary Care Ashlin Hidalgo: [name] Other Clinician: Referring Jenifer Struve: Treating Laylonie Marzec/Extender: Glean Hess in Treatment: 4 Vital Signs Time Taken: 09:36 Temperature (F): 98.3 Height (in): 66 Pulse (bpm): 76 Weight (lbs): 184 Respiratory Rate (breaths/min): 18 Body Mass Index (BMI): 29.7 Blood Pressure (mmHg): 157/73 Reference Range: 80 - 120 mg / dl Electronic  Signature(s) Signed: 11/16/2021 12:13:52 PM By: [name] Entered By: [name] on 11/16/2021 09:37:07

## 2021-11-16 NOTE — ED Notes (Signed)
Pt refused connection to monitors after Lorel Monaco (NT) explained that healthcare team would not be able to monitor patient remotely if not connected.

## 2021-11-16 NOTE — ED Notes (Signed)
When entering room with pt's discharge paperwork pt and his wife state that they are ready to go and have no further questions. Pt was requested to let me take one more set of VS however pt and his wife adamantly refused any further VS stating they are in a rush to make it to it to another appointment. Pt and wife refused to sign as well. Pt was assisted by wheelchair to vehicle with shoulder sling in place and in no obvious distress.

## 2021-11-16 NOTE — Progress Notes (Signed)
[name], Fernandez ([id number]) Visit Report for 11/16/2021 Debridement Details Patient Name: Date of Service: [place], [name] 11/16/2021 9:30 A M Medical Record Number: [id number] Patient Account Number: [id number] Date of Birth/Sex: Treating RN: May 24, 1941 (81 y.o. [name] Fernandez Primary Care Provider: [name] Other Clinician: Referring Provider: Treating Provider/Extender: [name] Fernandez in Treatment: 4 Debridement Performed for Assessment: Wound #7 Right,Lateral Foot Performed By: Physician Ricard Dillon., MD Debridement Type: Chemical/Enzymatic/Mechanical Agent Used: Santyl Level of Consciousness (Pre-procedure): Awake and Alert Pre-procedure Verification/Time Out Yes - 10:05 Taken: Start Time: 10:06 Bleeding: None End Time: 10:08 Response to Treatment: Procedure was tolerated well Level of Consciousness (Post- Awake and Alert procedure): Post Debridement Measurements of Total Wound Length: (cm) 0.3 Width: (cm) 0.3 Depth: (cm) 0.1 Volume: (cm) 0.007 Character of Wound/Ulcer Post Debridement: Stable Post Procedure Diagnosis Same as Pre-procedure Electronic Signature(s) Signed: 11/16/2021 12:13:52 PM By: [name] Signed: 11/16/2021 5:09:19 PM By: Linton Ham MD Entered By: [name] on 11/16/2021 10:19:20 -------------------------------------------------------------------------------- Debridement Details Patient Name: Date of Service: [place], [name] 11/16/2021 9:30 A M Medical Record Number: [id number] Patient Account Number: [id number] Date of Birth/Sex: Treating RN: 07/13/1940 (81 y.o. [name] Fernandez Primary Care Provider: [name] Other Clinician: Referring Provider: Treating Provider/Extender: [name] Fernandez in Treatment: 4 Debridement Performed for Assessment: Wound #6 Left,Lateral Foot Performed By: Physician Ricard Dillon., MD Debridement Type: Debridement Level  of Consciousness (Pre-procedure): Awake and Alert Pre-procedure Verification/Time Out Yes - 10:02 Taken: Start Time: 10:03 Pain Control: Other : Benzocaine T Area Debrided (L x W): otal 7 (cm) x 5.2 (cm) = 36.4 (cm) Tissue and other material debrided: Non-Viable, Slough, Slough Level: Non-Viable Tissue Debridement Description: Selective/Open Wound Instrument: Curette Bleeding: Minimum Hemostasis Achieved: Pressure End Time: 10:08 Response to Treatment: Procedure was tolerated well Level of Consciousness (Post- Awake and Alert procedure): Post Debridement Measurements of Total Wound Length: (cm) 7 Width: (cm) 5.2 Depth: (cm) 0.2 Volume: (cm) 5.718 Character of Wound/Ulcer Post Debridement: Stable Post Procedure Diagnosis Same as Pre-procedure Electronic Signature(s) Signed: 11/16/2021 12:13:52 PM By: [name] Signed: 11/16/2021 5:09:19 PM By: Linton Ham MD Entered By: Linton Ham on 11/16/2021 10:27:56 -------------------------------------------------------------------------------- HPI Details Patient Name: Date of Service: [name] Fernandez, [place] [name] 11/16/2021 9:30 A M Medical Record Number: [id number] Patient Account Number: [id number] Date of Birth/Sex: Treating RN: May 03, 1941 (81 y.o. [name] Fernandez Primary Care Provider: [name] Other Clinician: Referring Provider: Treating Provider/Extender: [name] Fernandez in Treatment: 4 History of Present Illness HPI Description: 81 year old gentleman known to have swelling of his left lower extremity with some ulceration along the medial ankle has been having these problems for the last 3-4 months and does not know how it came on. No history of injury or no history of any infection in this area. He is known to have diabetes mellitus controlled with diet, hyperlipidemia, hypertension and chronic lumbar back pain with sciatica which is being treated by his PCP. Last hemoglobin A1c was 6.3 in  June 2017. Last medical history is significant for esophageal stricture, hiatal hernia, vitamin D deficiency, status post back surgery 3, hernia repair as a child for both groins, hiatal hernia repair, joint replacement and revision of a knee surgery on the right side. He has quit smoking in 1982. He has never had a Doppler study of his lower extremity except remotely when he had knee surgery they looked for a blood  clot on the right side. 09/13/2016 -- venous reflux study done on 09/12/2016 showed there is evidence of greater saphenous vein reflux in the left lower extremity and the small saphenous vein and the posterior thigh is not competent. 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 With these results he definitely needs a referral to the vascular surgeons 09/20/2016 -- he has an appointment to see the vascular surgeons on May 1 10/03/2016 -- patient had a 4 layer compression on his left lower extremity and had a lot of discomfort and pain. 10/11/2016-- was seen by Dr. Althea [name] -- review of his data he recommended staged laser ablation of his great and then small saphenous veins for reduction odd of his venous hypertension. He did examine his right leg with the SonoSite and he has dilatation of the saphenous vein on the right lower extremity too. Since there was no evidence of ulceration he recommended observation of the right lower extremity. He recommended to continue with local wound care at the wound center until his wound was completely healed. 10/18/2016 -- the patient has not been very compliant with his compression, his diet and his diuretics and as a result his lymphedema has increased markedly 10/25/2016 -- he has been compliant this week, has worn his 2 press compression wraps, taken his diuretics and is watching his salt intake. 11/08/2016 -- he had his venous procedure done last week and details of this have been reported and reviewed in his electronic medical  record. he had a laser ablation of his great saphenous vein and this was done from mid calf to just below the saphenofemoral junction. He would return next week for ultrasound follow-up. He will then undergo the small saphenous vein ablation in a few weeks. 11/15/2016 -- his postprocedure visit showed good closure of his left great saphenous vein from the mid calf to 1-1/2 cm from the saphenofemoral junction. He had excellent early results from the ablation. The ablation of the left small saphenous vein was planned in a week 12/06/2016 -- he had his small saphenous vein venous ablation a week before and the duplex showed closure of his small saphenous vein to within half centimeter office saphenous popliteal junction with no DVT and he was pleased with the results. He recommended continue with elevation and compression and to see them back on an as-needed basis 12/19/16; the patient's wound is actually closed. Sometime over the weekend he developed a small abrasion on the lower calf just above the original wound on the left medial malleolus although this is closed as well Readmission: 01/18/18 on evaluation today patient presents after having last been seen in our clinic July 2018. Subsequently he is a reoccurrence of the ulcers on the left ankle both medial and now a new area lateral that had been present back room for about a month he tells me. He states that this has done very well for about a year he really does not know of any injury or anything that happened that would have caused this reopening at this point. He has continued to wear compression stockings which is appropriate. He does not have lymphedema pumps. He has continued to also keep the area clean and dry as best he could. With that being said now that is been reopened is been harder for him to take care of this as far as compression stockings are concerned keeping them clean along with continuing to manage his fluid and swelling. His ABI  appears to  be great on the left registering at 1.14 today. He has previously undergone a venous ablation. Other than compression/lymphedema pumps he's really done everything that he can to help manage and prevent these ulcers from forming. 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 01/30/18 on evaluation today patient appears to actually be doing very well in regard to his medial ankle ulcer. It's the lateral ulcer that actually is not doing quite as well today. Fortunately he did have some alginate left ovary start using this on the wounds and the medial ankle actually seems to be doing much better. The lateral ankle does actually need something to con a help with slough and buildup. The collagen really did not seem to do much for him in that regard. 02/13/18 on evaluation today patient appears to be doing about the same in regard to his ulcers. Unfortunately I do not feel like were making good progress at this time. When I have debrided the wound the slough seems to come back fairly quickly in the lateral ankle ulcer. There does not appear to be any evidence of infection at this time. No fevers, chills, nausea, or vomiting noted at this time. 02/27/18 on evaluation today patient actually appears to be showing some signs of improvement in regard to both wound areas. I'm insurance on the medial aspect of his left lower extremity at the ankle whether or not the alginate may be sticking and causing some tearing off of new skin attempting to grow. With that being said the patient states it does not seem to be doing such just pulls up some of the dried dead skin around. Nonetheless this is something I wanted to watch out for going forward and actually I recommended that he take the dressing off in the shower when you could get it completely wet before removal to see how this does. Subsequently in regard to the lateral ankle ulcer he still has slough noted at this point although I do believe that he is tolerating  the Medihoney much better I wish you could have gotten the Santyl but unfortunately the Santyl was too expensive gonna cost him $250. 03/06/18 on evaluation today patient appears to be doing about the same in regard to both ulcers of his left medial and lateral malleolus sites. He's been tolerating the dressing changes without complication. Unfortunately things do not seem to be improving in regard to either side at this time. Overall I think we may need to switch things up a little bit as far as the way we are performing the dressing changes currently. 03/27/18 on evaluation today patient's wound bed actually appears to be doing much better at both locations in regard to his ankle ulcers. He has been tolerating the dressing changes at this time without complication. Overall I'm very pleased with how things appear. The patient likewise states that he's much better in regard to discomfort at this time. 04/10/18 on evaluation today patient actually appears to be doing well in regard to his left lateral ankle ulcer. He has been tolerating the dressing changes without complication. Fortunately there does not appear to be any evidence of infection. Overall I do feel like he is tolerating the Medihoney without complication. 04/24/18 upon evaluation today patient's wound actually appears to be healed on the lateral aspect of his ankle the medial aspect still continues to remain close without any evidence of complication at this time. Overall I have been extremely pleased with how things have progressed up to this point. The patient likewise  is very happy he's not having any significant pain this time. He does wears compression stocking on a regular basis. Readmission: 10-19-2021 upon evaluation today patient presents for evaluation here in the clinic concerning issues with a left lateral ulcer as well as a right lateral ulcer both along the aspect of his foot getting close to the ankle. This left area is the  same place that I treated back in 2024. 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 This appears to be very inflamed and is also very painful. In the past he has been completely unable to tolerate the use of any compression wraps unfortunately. There does not appear to be any evidence of active infection locally nor systemically at this time which is good news. I do believe however we need to try to see what we can do to calm down the inflammation I think triamcinolone topically as well as oral prednisone would likely be indicated in this case. Patient's medical history really has not changed significantly since I last saw him in actually 2019 though I think is stated 2020 above. 10-26-21 upon evaluation today patient appears to still be having a lot of irritation and inflammation in regard to the ankles laterally. The left is greater than the right. With that being said I do believe that he would benefit from a biopsy to confirm whether or not this may indeed be vasculitis. It actually seems like that physically and on examination but again I want to be sure that we are on the right track as far as treatment is concerned. The good news is he did not have a DVT I called him about that on Friday this was all in regard to the right leg. Overall I am very pleased in that regard. 11-02-2021 upon evaluation today patient appears to be doing poorly still in regard to his left lower extremity. Apparently he had a fever on Sunday which was around 101 and he was severely sick feeling and disoriented according to his wife. She came with him today because she did not think that he would actually tell me what was going on. With that being said unfortunately he did not go to the hospital he actually has been doing a little better over the past few days he been taking amoxicillin he has not taken any other medications orally at this point that are different. He has not been on  any antibiotics either more recently other than the amoxicillin. Nonetheless he has not had a repeat in the past 3 days of that issue. 11-09-2021 upon evaluation today patient presents after having had quite an ordeal over the past several days. Subsequently yesterday I got a call from [place] here in [place] concerning the patient and the fact that he was having bleeding that been going on for the past 24 hours and was not stopping. Subsequently my advice was to have the patient go to the ER for further evaluation and treatment. It seems to me that he had a regular way part of the wound down to the point that a blood vessel and open. Apparently he was filling up trash bags wrapped around his foot with blood. In fact his lab review which I did do him as well today showed that he went from a white hemoglobin on 09-14-2021 of 13.6 down to a hemoglobin of 11.2 on 11-08-2021 this was yesterday. Subsequently this is due to the amount of blood loss that  he has had acutely. Again I do not believe he is at transfusion stage but at the same time he is extremely weak and states that a couple times when he is going to stand up he is actually fallen back into his chair. Nonetheless I do believe that he probably needs to supplement with iron and I did recommend that there are some over-the-counter products he could get in this regard. Subsequently also suggested that the patient needs to be drinking plenty of water he apparently drinks Coke and he drinks coffee but that is about it. Subsequently following this conversation his wife was present during this time as well and I do think that he is going to try to drink more I think it is of utmost importance. With that being said I did review his PCR culture as well it was positive for 2 organisms. This was Pseudomonas and Enterobacter. Both were showing up as being very prevalent in the PCR culture and subsequently Cipro will take care of the situation which is what I am  recommending at this point based on what we see. This will take the place of the Bactrim and can have him discontinue the Bactrim the only thing is he is getting need to stop taking the hydroxyzine if he has been taking it that is the Vistaril and he tells me that he only takes this when he itches he has not been taking it recently. 6/7; patient arrives in clinic today with the wound on the right foot actually looking some better the area on the left lateral looking about the same. Problematically, he is having edema fluid leakage on the medial part of the left foot and ankle causing skin breakdown but no open wound. He has been very resistant to the notion of compression wraps. He has been using Santyl on both wound areas at home changing the dressing himself Partway through our visit today it became clear he had had a fall on Sunday 3 days ago. Since then he has had a lot of problems with his right shoulder Electronic Signature(s) Signed: 11/16/2021 5:09:19 PM By: Linton Ham MD Entered By: Linton Ham on 11/16/2021 10:29:42 -------------------------------------------------------------------------------- Physical Exam Details Patient Name: Date of Service: [name] Fernandez, [place] [name] 11/16/2021 9:30 A M Medical Record Number: [id number] Patient Account Number: [id number] Date of Birth/Sex: Treating RN: Apr 18, 1941 (81 y.o. [name] Fernandez Primary Care Provider: [name] Other Clinician: Referring Provider: Treating Provider/Extender: [name] Fernandez in Treatment: 4 Constitutional Patient is hypertensive.. Pulse regular and within target range for patient.Marland Kitchen Respirations regular, non-labored and within target range.. Temperature is normal and within the target range for the patient.Marland Kitchen Appears in no distress. Cardiovascular 3+ pitting edema both legs bilaterally. I think this is probably severe chronic venous insufficiency. No evidence of cellulitis he has had a  duplex ultrasound that was negative for DVT . Musculoskeletal Right shoulder is swollen I believe there is an effusion. He has no abduction beyond 15 degrees. Notes Wound exam; the area on the right lateral foot is small with a generally clean base. I see no reason for debridement here. The real problematic areas on the left lateral foot large irregular wound that is 100% slough covered. In view of problems he has had with bleeding after debridement I did a very gentle debridement with a #5 curette to remove adherent surface slough. On the medial part of his left foot there is no wound however he is developing skin maceration  on his plantar foot and he is at risk for wounds in this area if we do not control the swelling. I saw no evidence of infection Electronic Signature(s) Signed: 11/16/2021 5:09:19 PM By: Linton Ham MD Entered By: Linton Ham on 11/16/2021 10:33:19 -------------------------------------------------------------------------------- Physician Orders Details Patient Name: Date of Service: [place], [name] 11/16/2021 9:30 A M Medical Record Number: [id number] Patient Account Number: [id number] Date of Birth/Sex: Treating RN: 1941-01-14 (81 y.o. 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 - 11/23/21 @  8:45 am with Bernette Redbird, Room 7) Return A Bathing/ Shower/ Hygiene May shower with protection but do not get wound dressing(s) wet. - May shower using cast protector bag from [facility] or [place] May shower and wash wound with soap and water. - wash with antibacterial soap when changing dressing Edema Control - Lymphedema / SCD / Other Elevate legs to the level of the heart or above for 30 minutes daily and/or when sitting, a frequency of: - throughout the day Patient to wear own compression stockings every day. - Use stockings daily to right leg Moisturize legs daily. - Eucerin (in jar) Additional Orders / Instructions Follow Nutritious Diet Non Wound Condition pply the following to affected area as directed: - Apply AgAlg to left medial foot under wrap A Wound Treatment Wound #6 - Foot Wound Laterality: Left, Lateral Cleanser: Soap and Water 1 x Per Week/30 Days Discharge Instructions: May shower and wash wound with dial antibacterial soap and water prior to dressing change. Peri-Wound Care: Triamcinolone 15 (g) 1 x Per Week/30 Days Discharge Instructions: Use triamcinolone 15 (g) as directed Peri-Wound Care: Sween Lotion (Moisturizing lotion) 1 x Per Week/30 Days Discharge Instructions: Apply moisturizing lotion as directed Topical: Gentamicin 1 x Per Week/30 Days Discharge Instructions: As directed by physician Prim Dressing: IODOFLEX 0.9% Cadexomer Iodine Pad 4x6 cm 1 x Per Week/30 Days ary Discharge Instructions: Apply to wound bed as instructed Secondary Dressing: ABD Pad, 5x9 1 x Per Week/30 Days Discharge Instructions: Apply over primary dressing as directed. Secondary Dressing: Zetuvit Plus 4x8 in 1 x Per Week/30 Days Discharge Instructions: Apply over primary dressing as directed. Compression Wrap: ThreePress (3 layer compression wrap) 1 x Per Week/30 Days Discharge Instructions: Apply three layer compression as directed. Wound #7 - Foot Wound Laterality: Right,  Lateral Cleanser: Soap and Water 1 x Per Day/30 Days Discharge Instructions: May shower and wash wound with dial antibacterial soap and water prior to dressing change. Prim Dressing: Santyl Ointment 1 x Per Day/30 Days ary Discharge Instructions: Apply nickel thick amount to wound bed as instructed Secondary Dressing: ALLEVYN Gentle Border, 5x5 (in/in) 1 x Per Day/30 Days Discharge Instructions: Apply over primary dressing as directed. Secondary Dressing: Woven Gauze Sponges 2x2 in 1 x Per Day/30 Days Discharge Instructions: Apply over primary dressing as directed. Electronic Signature(s) Signed: 11/16/2021 12:13:52 PM By: [name] Signed: 11/16/2021 5:09:19 PM By: Linton Ham MD Entered By: Onnie Boer  Jodi on 11/16/2021 10:38:14 -------------------------------------------------------------------------------- Problem List Details Patient Name: Date of Service: [place], [name] 11/16/2021 9:30 A M Medical Record Number: [id number] Patient Account Number: [id number] Date of Birth/Sex: Treating RN: [birth year] (81 y.o. 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 10/19/2021 No Yes L97.522 Non-pressure chronic ulcer of other part of left foot with fat layer exposed 10/19/2021 No Yes L97.512 Non-pressure chronic ulcer of other part of right foot with fat layer exposed 10/19/2021 No Yes E11.622 Type 2 diabetes mellitus with other skin ulcer 10/19/2021 No Yes Inactive Problems Resolved Problems Electronic Signature(s) Signed: 11/16/2021 5:09:19 PM By:  Linton Ham MD Entered By: Linton Ham on 11/16/2021 10:27:30 -------------------------------------------------------------------------------- Progress Note Details Patient Name: Date of Service: [name] Fernandez, [name] 11/16/2021 9:30 A M Medical Record Number: [id number] Patient Account Number: [id number] Date of Birth/Sex: Treating RN: Nov 23, 1940 (81 y.o. [name] Fernandez Primary Care Provider: [name] Other Clinician: Referring Provider: Treating Provider/Extender: [name] Fernandez in Treatment: 4 Subjective History of Present Illness (HPI) 81 year old gentleman known to have swelling of his left lower extremity with some ulceration along the medial ankle has been having these problems for the last 3-4 months and does not know how it came on. No history of injury or no history of any infection in this area. He is known to have diabetes mellitus controlled with diet, hyperlipidemia, hypertension and chronic lumbar back pain with sciatica which is being treated by his PCP. Last hemoglobin A1c was 6.3 in June 2017. Last medical history is significant for esophageal stricture, hiatal hernia, vitamin D deficiency, status post back surgery o3, hernia repair as a child for both groins, hiatal hernia repair, joint replacement and revision of a knee surgery on the right side. He has quit smoking in 1982. He has never had a Doppler study of his lower extremity except remotely when he had knee surgery they looked for a blood clot on the right side. 09/13/2016 -- venous reflux study done on 09/12/2016 showed there is evidence of greater saphenous vein reflux in the left lower extremity and the small saphenous vein and the posterior thigh is not competent. 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 With these results he definitely needs a referral to the vascular surgeons 09/20/2016 -- he has an appointment to see the vascular surgeons on May  1 10/03/2016 -- patient had a 4 layer compression on his left lower extremity and had a lot of discomfort and pain. 10/11/2016-- was seen by Dr. Althea [name] -- review of his data he recommended staged laser ablation of his great and then small saphenous veins for reduction odd of his venous hypertension. He did examine his right leg with the SonoSite and he has dilatation of the saphenous vein on the right lower extremity too. Since there was no evidence of ulceration he recommended observation of the right lower extremity. He recommended to continue with local wound care at the wound center until his wound was completely healed. 10/18/2016 -- the patient has not been very compliant with his compression, his diet and his diuretics and as a result his lymphedema  has increased markedly 10/25/2016 -- he has been compliant this week, has worn his 2 press compression wraps, taken his diuretics and is watching his salt intake. 11/08/2016 -- he had his venous procedure done last week and details of this have been reported and reviewed in his electronic medical record. he had a laser ablation of his great saphenous vein and this was done from mid calf to just below the saphenofemoral junction. He would return next week for ultrasound follow-up. He will then undergo the small saphenous vein ablation in a few weeks. 11/15/2016 -- his postprocedure visit showed good closure of his left great saphenous vein from the mid calf to 1-1/2 cm from the saphenofemoral junction. He had excellent early results from the ablation. The ablation of the left small saphenous vein was planned in a week 12/06/2016 -- he had his small saphenous vein venous ablation a week before and the duplex showed closure of his small saphenous vein to within half centimeter office saphenous popliteal junction with no DVT and he was pleased with the results. He recommended continue with elevation and compression and to see them back on an as-needed  basis 12/19/16; the patient's wound is actually closed. Sometime over the weekend he developed a small abrasion on the lower calf just above the original wound on the left medial malleolus although this is closed as well Readmission: 01/18/18 on evaluation today patient presents after having last been seen in our clinic July 2018. Subsequently he is a reoccurrence of the ulcers on the left ankle both medial and now a new area lateral that had been present back room for about a month he tells me. He states that this has done very well for about a year he really does not know of any injury or anything that happened that would have caused this reopening at this point. He has continued to wear compression stockings which is appropriate. He does not have lymphedema pumps. He has continued to also keep the area clean and dry as best he could. With that being said now that is been reopened is been harder for him to take care of this as far as compression stockings are concerned keeping them clean along with continuing to manage his fluid and swelling. His ABI appears to be great on the left registering at 1.14 today. He has previously undergone a venous ablation. Other than compression/lymphedema pumps he's really done everything that he can to help manage and prevent these ulcers from forming. 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 01/30/18 on evaluation today patient appears to actually be doing very well in regard to his medial ankle ulcer. It's the lateral ulcer that actually is not doing quite as well today. Fortunately he did have some alginate left ovary start using this on the wounds and the medial ankle actually seems to be doing much better. The lateral ankle does actually need something to con a help with slough and buildup. The collagen really did not seem to do much for him in that regard. 02/13/18 on evaluation today patient appears to be doing about the same in regard to his ulcers. Unfortunately I do not  feel like were making good progress at this time. When I have debrided the wound the slough seems to come back fairly quickly in the lateral ankle ulcer. There does not appear to be any evidence of infection at this time. No fevers, chills, nausea, or vomiting noted at this time. 02/27/18 on evaluation today patient actually appears to  be showing some signs of improvement in regard to both wound areas. I'm insurance on the medial aspect of his left lower extremity at the ankle whether or not the alginate may be sticking and causing some tearing off of new skin attempting to grow. With that being said the patient states it does not seem to be doing such just pulls up some of the dried dead skin around. Nonetheless this is something I wanted to watch out for going forward and actually I recommended that he take the dressing off in the shower when you could get it completely wet before removal to see how this does. Subsequently in regard to the lateral ankle ulcer he still has slough noted at this point although I do believe that he is tolerating the Medihoney much better I wish you could have gotten the Santyl but unfortunately the Santyl was too expensive gonna cost him $250. 03/06/18 on evaluation today patient appears to be doing about the same in regard to both ulcers of his left medial and lateral malleolus sites. He's been tolerating the dressing changes without complication. Unfortunately things do not seem to be improving in regard to either side at this time. Overall I think we may need to switch things up a little bit as far as the way we are performing the dressing changes currently. 03/27/18 on evaluation today patient's wound bed actually appears to be doing much better at both locations in regard to his ankle ulcers. He has been tolerating the dressing changes at this time without complication. Overall I'm very pleased with how things appear. The patient likewise states that he's much better  in regard to discomfort at this time. 04/10/18 on evaluation today patient actually appears to be doing well in regard to his left lateral ankle ulcer. He has been tolerating the dressing changes without complication. Fortunately there does not appear to be any evidence of infection. Overall I do feel like he is tolerating the Medihoney without complication. 04/24/18 upon evaluation today patient's wound actually appears to be healed on the lateral aspect of his ankle the medial aspect still continues to remain close without any evidence of complication at this time. Overall I have been extremely pleased with how things have progressed up to this point. The patient likewise is very happy he's not having any significant pain this time. He does wears compression stocking on a regular basis. Readmission: 10-19-2021 upon evaluation today patient presents for evaluation here in the clinic concerning issues with a left lateral ulcer as well as a right lateral ulcer both along the aspect of his foot getting close to the ankle. This left area is the same place that I treated back in 2024. 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 This appears to be very inflamed and is also very painful. In the past he has been completely unable to tolerate the use of any compression wraps unfortunately. There does not appear to be any evidence of active infection locally nor systemically at this time which is good news. I do believe however we need to try to see what we can do to calm down the inflammation I think triamcinolone topically as well as oral prednisone would likely be indicated in this case. Patient's medical history really has not changed significantly since I last saw him in actually 2019 though I think is stated 2020 above. 10-26-21 upon evaluation today patient appears to still be having a lot  of irritation and inflammation in regard to the ankles laterally. The left  is greater than the right. With that being said I do believe that he would benefit from a biopsy to confirm whether or not this may indeed be vasculitis. It actually seems like that physically and on examination but again I want to be sure that we are on the right track as far as treatment is concerned. The good news is he did not have a DVT I called him about that on Friday this was all in regard to the right leg. Overall I am very pleased in that regard. 11-02-2021 upon evaluation today patient appears to be doing poorly still in regard to his left lower extremity. Apparently he had a fever on Sunday which was around 101 and he was severely sick feeling and disoriented according to his wife. She came with him today because she did not think that he would actually tell me what was going on. With that being said unfortunately he did not go to the hospital he actually has been doing a little better over the past few days he been taking amoxicillin he has not taken any other medications orally at this point that are different. He has not been on any antibiotics either more recently other than the amoxicillin. Nonetheless he has not had a repeat in the past 3 days of that issue. 11-09-2021 upon evaluation today patient presents after having had quite an ordeal over the past several days. Subsequently yesterday I got a call from [facility] here in [place] concerning the patient and the fact that he was having bleeding that been going on for the past 24 hours and was not stopping. Subsequently my advice was to have the patient go to the ER for further evaluation and treatment. It seems to me that he had a regular way part of the wound down to the point that a blood vessel and open. Apparently he was filling up trash bags wrapped around his foot with blood. In fact his lab review which I did do him as well today showed that he went from a white hemoglobin on 09-14-2021 of 13.6 down to a hemoglobin of 11.2 on  11-08-2021 this was yesterday. Subsequently this is due to the amount of blood loss that he has had acutely. Again I do not believe he is at transfusion stage but at the same time he is extremely weak and states that a couple times when he is going to stand up he is actually fallen back into his chair. Nonetheless I do believe that he probably needs to supplement with iron and I did recommend that there are some over-the-counter products he could get in this regard. Subsequently also suggested that the patient needs to be drinking plenty of water he apparently drinks Coke and he drinks coffee but that is about it. Subsequently following this conversation his wife was present during this time as well and I do think that he is going to try to drink more I think it is of utmost importance. With that being said I did review his PCR culture as well it was positive for 2 organisms. This was Pseudomonas and Enterobacter. Both were showing up as being very prevalent in the PCR culture and subsequently Cipro will take care of the situation which is what I am recommending at this point based on what we see. This will take the place of the Bactrim and can have him discontinue the Bactrim the only thing is  he is getting need to stop taking the hydroxyzine if he has been taking it that is the Vistaril and he tells me that he only takes this when he itches he has not been taking it recently. 6/7; patient arrives in clinic today with the wound on the right foot actually looking some better the area on the left lateral looking about the same. Problematically, he is having edema fluid leakage on the medial part of the left foot and ankle causing skin breakdown but no open wound. He has been very resistant to the notion of compression wraps. He has been using Santyl on both wound areas at home changing the dressing himself Partway through our visit today it became clear he had had a fall on Sunday 3 days ago. Since then  he has had a lot of problems with his right shoulder Objective Constitutional Patient is hypertensive.. Pulse regular and within target range for patient.Marland Kitchen Respirations regular, non-labored and within target range.. Temperature is normal and within the target range for the patient.Marland Kitchen Appears in no distress. Vitals Time Taken: 9:36 AM, Height: 66 in, Weight: 184 lbs, BMI: 29.7, Temperature: 98.3 F, Pulse: 76 bpm, Respiratory Rate: 18 breaths/min, Blood Pressure: 157/73 mmHg. Cardiovascular 3+ pitting edema both legs bilaterally. I think this is probably severe chronic venous insufficiency. No evidence of cellulitis he has had a duplex ultrasound that was negative for DVT . Musculoskeletal Right shoulder is swollen I believe there is an effusion. He has no abduction beyond 15 degrees. General Notes: Wound exam; the area on the right lateral foot is small with a generally clean base. I see no reason for debridement here. The real problematic areas on the left lateral foot large irregular wound that is 100% slough covered. In view of problems he has had with bleeding after debridement I did a very gentle debridement with a #5 curette to remove adherent surface slough. On the medial part of his left foot there is no wound however he is developing skin maceration on his plantar foot and he is at risk for wounds in this area if we do not control the swelling. I saw no evidence of infection Integumentary (Hair, Skin) Wound #6 status is Open. Original cause of wound was Gradually Appeared. The date acquired was: 07/25/2021. The wound has been in treatment 4 weeks. The wound is located on the Left,Lateral Foot. The wound measures 7cm length x 5.2cm width x 0.2cm depth; 28.588cm^2 area and 5.718cm^3 volume. There is Fat Layer (Subcutaneous Tissue) exposed. There is no tunneling or undermining noted. There is a medium amount of purulent drainage noted. The wound margin is distinct with the outline attached  to the wound base. There is small (1-33%) red granulation within the wound bed. There is a large (67-100%) amount of necrotic tissue within the wound bed including Adherent Slough. Wound #7 status is Open. Original cause of wound was Gradually Appeared. The date acquired was: 06/20/2021. The wound has been in treatment 4 weeks. The wound is located on the Right,Lateral Foot. The wound measures 0.3cm length x 0.3cm width x 0.1cm depth; 0.071cm^2 area and 0.007cm^3 volume. There is Fat Layer (Subcutaneous Tissue) exposed. There is no tunneling or undermining noted. There is a medium amount of serosanguineous drainage noted. The wound margin is distinct with the outline attached to the wound base. There is large (67-100%) pink, pale granulation within the wound bed. There is a small (1- 33%) amount of necrotic tissue within the wound bed including Adherent Slough.  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 There was a Selective/Open Wound Non-Viable Tissue Debridement with a total area of 36.4 sq cm performed by Ricard Dillon., MD. With the following instrument(s): Curette to remove Non-Viable tissue/material. Material removed includes [facility] after achieving pain control using Other (Benzocaine). No specimens were taken. A time out was conducted at 10:02, prior to the start of the procedure. A Minimum amount of bleeding was controlled with Pressure. The procedure was tolerated well. Post Debridement Measurements: 7cm length x 5.2cm  width x 0.2cm depth; 5.718cm^3 volume. Character of Wound/Ulcer Post Debridement is stable. 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 There was a Three Layer Compression Therapy Procedure by [name], RN. 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 There was a Chemical/Enzymatic/Mechanical debridement performed by Ricard Dillon., MD.. Agent used was Santyl. A time out was conducted at 10:05, prior to the start of the procedure. There was no bleeding. The procedure was tolerated well. Post Debridement Measurements: 0.3cm length x 0.3cm width x 0.1cm depth; 0.007cm^3 volume. Character of Wound/Ulcer Post Debridement is stable. Post procedure Diagnosis Wound #7: Same as Pre-Procedure Plan Follow-up Appointments: Return Appointment in 1 week. - 11/23/21 @ 8:45 am with Bernette Redbird, Room 7) Bathing/ Shower/ Hygiene: May shower with protection but do not get wound dressing(s) wet. - May shower using cast protector bag from [facility], [facility] or [place] May shower and wash wound with soap and water. - wash with antibacterial soap when changing dressing Edema Control - Lymphedema / SCD / Other: Elevate legs to the level of the heart or above for 30 minutes daily and/or when sitting, a frequency of: - throughout the day Patient to wear own compression stockings every day. - Use stockings daily to right leg Moisturize legs daily. - Eucerin (in jar) Additional Orders / Instructions: Follow Nutritious Diet Non Wound Condition: Apply the following to affected area as directed: - Apply AgAlg to left medial foot under wrap WOUND #6: - Foot Wound Laterality: Left, Lateral Cleanser: Soap and Water 1 x Per Day/30 Days Discharge Instructions: May shower and wash wound with dial antibacterial soap  and water prior to dressing change. Peri-Wound Care: Triamcinolone 15 (g) 1 x Per Day/30 Days Discharge Instructions: Use triamcinolone 15 (g) as directed Peri-Wound Care: Sween Lotion (Moisturizing lotion) 1 x Per Day/30 Days Discharge Instructions: Apply moisturizing lotion as directed Topical: Gentamicin 1 x Per Day/30 Days Discharge Instructions: As directed by physician Prim Dressing: IODOFLEX 0.9% Cadexomer Iodine Pad 4x6 cm 1 x Per Day/30 Days ary Discharge Instructions: Apply to wound bed as instructed Secondary Dressing: ABD Pad, 5x9 1 x Per Day/30 Days Discharge Instructions: Apply over primary dressing as directed. Secondary Dressing: Zetuvit Plus 4x8 in 1 x Per Day/30 Days Discharge Instructions: Apply over primary dressing as directed. Com pression Wrap: ThreePress (3 layer compression wrap) 1 x Per Day/30 Days Discharge Instructions: Apply three layer  compression as directed. WOUND #7: - Foot Wound Laterality: Right, Lateral Cleanser: Soap and Water 1 x Per Day/30 Days Discharge Instructions: May shower and wash wound with dial antibacterial soap and water prior to dressing change. Prim Dressing: Santyl Ointment 1 x Per Day/30 Days ary Discharge Instructions: Apply nickel thick amount to wound bed as instructed Secondary Dressing: ALLEVYN Gentle Border, 5x5 (in/in) 1 x Per Day/30 Days Discharge Instructions: Apply over primary dressing as directed. Secondary Dressing: Woven Gauze Sponges 2x2 in 1 x Per Day/30 Days Discharge Instructions: Apply over primary dressing as directed. 1. The patient needs edema control in the left leg. With some discussion I managed to get him to agree to 3 layer compression on the left. In view of that we will use Iodoflex to help with ongoing debridement on the lateral left leg. In view of the cultures that were done I applied light gentamicin under the knee at this. He seems to have tolerated the ciprofloxacin that was previously prescribed  well. 2. Continue Santyl on the right foot 3. I will put silver alginate on the area on the medial left foot even though nothing is open. He is having drainage from uncontrolled swelling in this area. This would put him at risk for skin breakdown and wounds in this area. 4. I had a quick look at his right shoulder there there is an effusion and swelling bruising extending into his bicep area. He has absolutely no range beyond 15 degrees in abduction. I think the clinical suggestion here would be a rotator cuff tear. I strongly suggested a semiurgent orthopedic consult Electronic Signature(s) Signed: 11/16/2021 5:09:19 PM By: Linton Ham MD Entered By: Linton Ham on 11/16/2021 10:35:11 -------------------------------------------------------------------------------- SuperBill Details Patient Name: Date of Service: [name] Fernandez, RO [name] 11/16/2021 Medical Record Number: [id number] Patient Account Number: [id number] Date of Birth/Sex: Treating RN: Mar 12, 1941 (81 y.o. 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 L97.522 Non-pressure chronic ulcer of other part of left foot with fat layer exposed L97.512 Non-pressure chronic ulcer of other part of right foot with fat layer exposed E11.622 Type 2 diabetes mellitus with other skin ulcer Facility Procedures CPT4 Code: 71219758 Description: 83254 - DEBRIDE WOUND 1ST 20 SQ CM OR < ICD-10 Diagnosis Description L97.522 Non-pressure chronic ulcer of other part of left foot with fat layer exposed Modifier: Quantity: 1 CPT4 Code: [id number] Description: 30940 - DEBRIDE WOUND EA  ADDL 20 SQ CM ICD-10 Diagnosis Description L97.522 Non-pressure chronic ulcer of other part of left foot with fat layer exposed Modifier: Quantity: 1 CPT4 Code: [id number] Description: 03159 - DEBRIDE W/O ANES NON SELECT ICD-10 Diagnosis Description L97.512 Non-pressure chronic ulcer of other part of right foot with fat layer exposed Modifier: Quantity: 1 Physician Procedures : CPT4 Code Description Modifier [id number] - WC PHYS DEBR WO ANESTH 20 SQ CM ICD-10 Diagnosis Description L97.522 Non-pressure chronic ulcer of other part of left foot with fat layer exposed Quantity: 1 : [id number] [id number] - WC PHYS DEBR WO ANESTH EA ADD 20 CM ICD-10 Diagnosis Description L97.522 Non-pressure chronic ulcer of other part of left foot with fat layer exposed Quantity: 1 Electronic Signature(s) Signed: 11/16/2021 5:09:19 PM By: Linton Ham MD Entered By: Linton Ham on 11/16/2021  10:35:38 

## 2021-11-22 NOTE — Progress Notes (Signed)
ASHAZ, ROBLING ([id number]) Visit Report for 11/02/2021 Arrival Information Details Patient Name: Date of Service: [place], [name] 11/02/2021 9:30 A M Medical Record Number: [id number] Patient Account Number: [id number] Date of Birth/Sex: Treating RN: 11/03/1940 (81 y.o. Male) [name] Primary Care Justyna Timoney: [name] Other Clinician: Referring Lalena Salas: Treating Rafferty Postlewait/Extender: Azucena Kuba in Treatment: 2 Visit Information History Since Last Visit Added or deleted any medications: No Patient Arrived: [name] Any new allergies or adverse reactions: No Arrival Time: 09:28 Had a fall or experienced change in No Accompanied By: wife activities of daily living that may affect Transfer Assistance: None risk of falls: Patient Identification Verified: Yes Signs or symptoms of abuse/neglect since last visito No Secondary Verification Process Completed: Yes Hospitalized since last visit: No Patient Requires Transmission-Based Precautions: No Implantable device outside of the clinic excluding No Patient Has Alerts: No cellular tissue based products placed in the center since last visit: Has Dressing in Place as Prescribed: Yes Pain Present Now: Yes Electronic Signature(s) Signed: 11/22/2021 8:46:54 AM By: Erenest Blank Entered By: Erenest Blank on 11/02/2021 09:29:30 -------------------------------------------------------------------------------- Encounter Discharge Information Details Patient Name: Date of Service: [place], [name] 11/02/2021 9:30 A M Medical Record Number: [id number] Patient Account Number: [id number] Date of Birth/Sex: Treating RN: January 12, 1941 (81 y.o. Male) [name] Primary Care Amandeep Nesmith: [name] Other Clinician: Referring Gennifer Potenza: Treating Nanette Wirsing/Extender: Azucena Kuba in Treatment: 2 Encounter Discharge Information Items Post Procedure Vitals Discharge Condition:  Stable Temperature (F): 97.8 Ambulatory Status: Ambulatory Pulse (bpm): 69 Discharge Destination: Home Respiratory Rate (breaths/min): 18 Transportation: Private Auto Blood Pressure (mmHg): 137/81 Accompanied By: wife Schedule Follow-up Appointment: Yes Clinical Summary of Care: Provided on 11/02/2021 Form Type Recipient Paper Patient Patient Electronic Signature(s) Signed: 11/02/2021 3:58:49 PM By: [name] Entered By: [name] on 11/02/2021 10:36:00 -------------------------------------------------------------------------------- Lower Extremity Assessment Details Patient Name: Date of Service: [place], [name] 11/02/2021 9:30 A M Medical Record Number: [id number] Patient Account Number: [id number] Date of Birth/Sex: Treating RN: [birth year] (81 y.o. Male) [name] Primary Care Eithel Ryall: [name] Other Clinician: Referring Decarlos Empey: Treating Gwyn Mehring/Extender: Azucena Kuba in Treatment: 2 Edema Assessment Assessed: [Left: No] [Right: No] Edema: [Left: Yes] [Right: Yes] Calf Left: Right: Point of Measurement: 32 cm From Medial Instep 41.9 cm 43.7 cm Ankle Left: Right: Point of Measurement: 10 cm From Medial Instep 28.4 cm 26.3 cm Vascular Assessment Pulses: Dorsalis Pedis Palpable: [Left:Yes] [Right:Yes] Electronic Signature(s) Signed: 11/02/2021 3:58:49 PM By: [name] Signed: 11/22/2021 8:46:54 AM By: Erenest Blank Entered By: Erenest Blank on 11/02/2021 09:39:26 -------------------------------------------------------------------------------- Multi-Disciplinary Care Plan Details Patient Name: Date of Service: [place], [name] 11/02/2021 9:30 A M Medical Record Number: [id number] Patient Account Number: [id number] Date of Birth/Sex: Treating RN: 29-Dec-1940 (81 y.o. 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 Compression as ordered Notes: Wound/Skin Impairment Nursing Diagnoses: Impaired tissue integrity Goals: Patient/caregiver will verbalize understanding of skin care regimen Date Initiated: 10/19/2021 Target Resolution Date: 11/16/2021 Goal Status: Active Ulcer/skin breakdown will have a volume reduction of 30% by week 4 Date Initiated:  10/19/2021 Target Resolution Date: 11/16/2021 Goal Status: Active Interventions: Assess patient/caregiver ability to obtain necessary supplies Assess patient/caregiver ability to perform ulcer/skin care regimen upon admission and as needed Assess ulceration(s) every visit Provide education on ulcer and skin care Treatment Activities: Topical wound management initiated : 10/19/2021 Notes: Electronic Signature(s) Signed: 11/02/2021 3:58:49 PM By: [name] Entered By: [name] on 11/02/2021 10:12:48 -------------------------------------------------------------------------------- Pain Assessment Details Patient Name: Date of Service: [name], [name] 11/02/2021 9:30 A M Medical Record Number: [id number] Patient Account Number: [id number] Date of Birth/Sex: Treating RN: 1941/02/18 (81 y.o. Male) [name] Primary Care Meliton Samad: [name] Other Clinician: Referring Bethannie Iglehart: Treating Alix Stowers/Extender: Azucena Kuba in Treatment: 2 Active Problems Location of Pain Severity and Description of Pain Patient Has Paino Yes Site Locations Pain Location: Pain in Ulcers Duration of the Pain. Constant / Intermittento Constant Rate the pain. Current  Pain Level: 7 Character of Pain Describe the Pain: Sharp, Stabbing Pain Management and Medication Current Pain Management: Medication: Yes How does your wound impact your activities of daily livingo Sleep: Yes Appetite: Yes Electronic Signature(s) Signed: 11/02/2021 3:58:49 PM By: [name] Signed: 11/22/2021 8:46:54 AM By: Erenest Blank Entered By: Erenest Blank on 11/02/2021 09:31:24 -------------------------------------------------------------------------------- Patient/Caregiver Education Details Patient Name: Date of Service: [place] [name] 5/24/2023andnbsp9:30 A M Medical Record Number: [id number] Patient Account Number: [id number] Date of Birth/Gender: Treating RN: 12/07/1940 (81 y.o. Male) [name] Primary Care Physician: [name] Other Clinician: Referring Physician: Treating Physician/Extender: Azucena Kuba in Treatment: 2 Education Assessment Education Provided To: Patient Education Topics Provided Venous: Methods: Explain/Verbal, Printed Responses: State content correctly Wound/Skin Impairment: Methods: Demonstration, Explain/Verbal, Printed Responses: State content correctly Electronic Signature(s) Signed: 11/02/2021 3:58:49 PM By: [name] Entered By: [name] on 11/02/2021 10:13:06 -------------------------------------------------------------------------------- Wound Assessment Details Patient Name: Date of Service: [name], [name] 11/02/2021 9:30 A M Medical Record Number: [id number] Patient Account Number: [id number] Date of Birth/Sex: Treating RN: Oct 24, 1940 (81 y.o. Male) [name] Primary Care Mailani Degroote: [name] Other Clinician: Referring Mayline Dragon: Treating Olivene Cookston/Extender: Azucena Kuba in Treatment: 2 Wound Status Wound Number: 6 Primary Vasculitis Etiology: Wound Location: Left, Lateral Foot Wound Open Wounding Event: Gradually  Appeared Status: Date Acquired: 07/25/2021 Comorbid Anemia, Chronic Obstructive Pulmonary Disease (COPD), Weeks Of Treatment: 2 History: Hypertension, Peripheral Venous Disease, Type II Diabetes, Clustered Wound: No Osteoarthritis, Neuropathy Photos Wound Measurements Length: (cm) 6.4 Width: (cm) 6.2 Depth: (cm) 0.2 Area: (cm) 31.165 Volume: (cm) 6.233 % Reduction in Area: -60% % Reduction in Volume: -60% Epithelialization: None Tunneling: No Undermining: No Wound Description Classification: Full Thickness Without Exposed Support Structures Wound Margin: Distinct, outline attached Exudate Amount: Medium Exudate Type: Purulent Exudate Color: yellow, brown, green Foul Odor After Cleansing: No Slough/Fibrino Yes Wound Bed Granulation Amount: Small (1-33%) Exposed Structure Granulation Quality: Red Fascia Exposed: No Necrotic Amount: Large (67-100%) Fat Layer (Subcutaneous Tissue) Exposed: Yes Necrotic Quality: Adherent Slough Tendon Exposed: No Muscle Exposed: No Joint Exposed: No Bone Exposed: No Electronic Signature(s) Signed: 11/02/2021 3:58:49 PM By: [name] Signed: 11/22/2021 8:46:54 AM By: Erenest Blank Entered By: Erenest Blank on 11/02/2021 09:46:40 -------------------------------------------------------------------------------- Wound Assessment Details Patient Name: Date of Service: [place], [name] 11/02/2021 9:30 A M Medical Record Number: [id number] Patient Account Number: [id number] Date of Birth/Sex: Treating RN: [birth year] (81 y.o. Male) [name] Primary Care Abrielle Finck: [name] Other Clinician: Referring Varvara Legault: Treating  Marilene Vath/Extender: Marcos Eke Weeks in Treatment: 2 Wound Status Wound Number: 7 Primary Vasculitis Etiology: Wound Location: Right, Lateral Foot Wound Open Wounding Event: Gradually Appeared Status: Date Acquired: 06/20/2021 Comorbid Anemia, Chronic Obstructive Pulmonary  Disease (COPD), Weeks Of Treatment: 2 History: Hypertension, Peripheral Venous Disease, Type II Diabetes, Clustered Wound: No Osteoarthritis, Neuropathy Photos Wound Measurements Length: (cm) 0.3 Width: (cm) 0.2 Depth: (cm) 0.2 Area: (cm) 0.047 Volume: (cm) 0.009 % Reduction in Area: 50% % Reduction in Volume: 67.9% Epithelialization: None Wound Description Classification: Full Thickness Without Exposed Support Structures Wound Margin: Distinct, outline attached Exudate Amount: Medium Exudate Type: Serosanguineous Exudate Color: red, brown Foul Odor After Cleansing: No Slough/Fibrino Yes Wound Bed Granulation Amount: Large (67-100%) Exposed Structure Granulation Quality: Pink Fascia Exposed: No Necrotic Amount: Small (1-33%) Fat Layer (Subcutaneous Tissue) Exposed: Yes Necrotic Quality: Adherent Slough Tendon Exposed: No Muscle Exposed: No Joint Exposed: No Bone Exposed: No Electronic Signature(s) Signed: 11/02/2021 3:58:49 PM By: [name] Signed: 11/22/2021 8:46:54 AM By: Erenest Blank Entered By: Erenest Blank on 11/02/2021 09:51:13 -------------------------------------------------------------------------------- Vitals Details Patient Name: Date of Service: [name], [name] 11/02/2021 9:30 A M Medical Record Number: [id number] Patient Account Number: [id number] Date of Birth/Sex: Treating RN: 20-Dec-1940 (81 y.o. Male) [name] Primary Care Naaman Curro: [name] Other Clinician: Referring Gualberto Wahlen: Treating Samirah Scarpati/Extender: Azucena Kuba in Treatment: 2 Vital Signs Time Taken: 09:29 Temperature (F): 97.8 Height (in): 66 Pulse (bpm): 69 Weight (lbs): 184 Respiratory Rate (breaths/min): 18 Body Mass Index (BMI): 29.7 Blood Pressure (mmHg): 137/81 Reference Range: 80 - 120 mg / dl Airway Pulse Oximetry (%): 94 Electronic Signature(s) Signed: 11/22/2021 8:46:54 AM By: Erenest Blank Entered By: Erenest Blank on 11/02/2021 09:30:10

## 2021-11-23 ENCOUNTER — Encounter (HOSPITAL_BASED_OUTPATIENT_CLINIC_OR_DEPARTMENT_OTHER): Payer: Medicare HMO | Admitting: Physician Assistant

## 2021-11-23 DIAGNOSIS — L97512 Non-pressure chronic ulcer of other part of right foot with fat layer exposed: Secondary | ICD-10-CM | POA: Diagnosis not present

## 2021-11-23 DIAGNOSIS — I87333 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 extremity: Secondary | ICD-10-CM | POA: Diagnosis not present

## 2021-11-23 DIAGNOSIS — E11622 Type 2 diabetes mellitus with other skin ulcer: Secondary | ICD-10-CM | POA: Diagnosis not present

## 2021-11-23 DIAGNOSIS — Z87891 Personal history of nicotine dependence: Secondary | ICD-10-CM | POA: Diagnosis not present

## 2021-11-23 DIAGNOSIS — L958 Other vasculitis limited to the skin: Secondary | ICD-10-CM | POA: Diagnosis not present

## 2021-11-23 DIAGNOSIS — L97522 Non-pressure chronic ulcer of other part of left foot with fat layer exposed: Secondary | ICD-10-CM | POA: Diagnosis not present

## 2021-11-23 DIAGNOSIS — M25511 Pain in right shoulder: Secondary | ICD-10-CM | POA: Diagnosis not present

## 2021-11-23 DIAGNOSIS — I890 Lymphedema, not elsewhere classified: Secondary | ICD-10-CM | POA: Diagnosis not present

## 2021-11-23 NOTE — Progress Notes (Addendum)
[name] Fernandez, [name] Fernandez ([id number]) Visit Report for 11/23/2021 Chief Complaint Document Details Patient Name: Date of Service: [place], [name] 11/23/2021 8:45 A M Medical Record Number: [id number] Patient Account Number: [id number] Date of Birth/Sex: Treating RN: [birth year] (81 y.o. [name] Primary Care Provider: [name] Other Clinician: Referring Provider: Treating Provider/Extender: Azucena Kuba in Treatment: 5 Information Obtained from: Patient Chief Complaint Bilateral foot ulcers Electronic Signature(s) Signed: 11/23/2021 8:44:28 AM By: [name] PA-C Entered By: [name] on 11/23/2021 08:44:27 -------------------------------------------------------------------------------- HPI Details Patient Name: Date of Service: [place], [name] 11/23/2021 8:45 A M Medical Record Number: [id number] Patient Account Number: [id number] Date of Birth/Sex: Treating RN: 09-27-1940 (81 y.o. [name] Primary Care Provider: [name] Other Clinician: Referring Provider: Treating Provider/Extender: Azucena Kuba in Treatment: 5 History of Present Illness HPI Description: 81 year old gentleman known to have swelling of his left lower extremity with some ulceration along the medial ankle has been having these problems for the last 3-4 months and does not know how it came on. No history of injury or no history of any infection in this area. He is known to have diabetes mellitus controlled with diet, hyperlipidemia, hypertension and chronic lumbar back pain with sciatica which is being treated by his PCP. Last hemoglobin A1c was 6.3 in June 2017. Last medical history is significant for esophageal stricture, hiatal hernia, vitamin D deficiency, status post back surgery 3, hernia repair as a child for both groins, hiatal hernia repair, joint replacement and revision of a knee surgery on the right side. He  has quit smoking in 1982. He has never had a Doppler study of his lower extremity except remotely when he had knee surgery they looked for a blood clot on the right side. 09/13/2016 -- venous reflux study done on 09/12/2016 showed there is evidence of greater saphenous vein reflux in the left lower extremity and the small saphenous vein and the posterior thigh is not competent. 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 With these results he definitely needs a referral to the vascular surgeons 09/20/2016 -- he has an appointment to see the vascular surgeons on May 1 10/03/2016 -- patient had a 4 layer compression on his left lower extremity and had a lot of discomfort and pain. 10/11/2016-- was seen by Dr. Althea [name] -- review of his data he recommended staged laser ablation of his great and then small saphenous veins for reduction odd of his venous hypertension. He did examine his right leg with the SonoSite and he has dilatation of the saphenous vein on the right lower extremity too. Since there was no evidence of ulceration he recommended observation of the right lower extremity. He recommended to continue with local wound care at the wound center until his wound was completely healed. 10/18/2016 -- the patient has not been very compliant with his compression, his diet and his diuretics and as a result his lymphedema has increased markedly 10/25/2016 -- he has been compliant this week, has worn his 2 press compression wraps, taken his diuretics and is watching his salt intake. 11/08/2016 -- he had his venous procedure done last week and details of this have been reported and reviewed in his electronic medical record. he had a laser ablation of his great saphenous vein and this was done from mid calf to just below the saphenofemoral junction. He would return next week  for ultrasound follow-up. He will then undergo the small saphenous vein ablation in a few weeks. 11/15/2016 -- his  postprocedure visit showed good closure of his left great saphenous vein from the mid calf to 1-1/2 cm from the saphenofemoral junction. He had excellent early results from the ablation. The ablation of the left small saphenous vein was planned in a week 12/06/2016 -- he had his small saphenous vein venous ablation a week before and the duplex showed closure of his small saphenous vein to within half centimeter office saphenous popliteal junction with no DVT and he was pleased with the results. He recommended continue with elevation and compression and to see them back on an as-needed basis 12/19/16; the patient's wound is actually closed. Sometime over the weekend he developed a small abrasion on the lower calf just above the original wound on the left medial malleolus although this is closed as well Readmission: 01/18/18 on evaluation today patient presents after having last been seen in our clinic July 2018. Subsequently he is a reoccurrence of the ulcers on the left ankle both medial and now a new area lateral that had been present back room for about a month he tells me. He states that this has done very well for about a year he really does not know of any injury or anything that happened that would have caused this reopening at this point. He has continued to wear compression stockings which is appropriate. He does not have lymphedema pumps. He has continued to also keep the area clean and dry as best he could. With that being said now that is been reopened is been harder for him to take care of this as far as compression stockings are concerned keeping them clean along with continuing to manage his fluid and swelling. His ABI appears to be great on the left registering at 1.14 today. He has previously undergone a venous ablation. Other than compression/lymphedema pumps he's really done everything that he can to help manage and prevent these ulcers from forming. 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 01/30/18 on evaluation today patient appears to actually be doing very well in regard to his medial ankle ulcer. It's the lateral ulcer that actually is not doing quite as well today. Fortunately he did have some alginate left ovary start using this on the wounds and the medial ankle actually seems to be doing much better. The lateral ankle does actually need something to con a help with slough and buildup. The collagen really did not seem to do much for him in that regard. 02/13/18 on evaluation today patient appears to be doing about the same in regard to his ulcers. Unfortunately I do not feel like were making good progress at this time. When I have debrided the wound the slough seems to come back fairly quickly in the lateral ankle ulcer. There does not appear to be any evidence of infection at this time. No fevers, chills, nausea, or vomiting noted at this time. 02/27/18 on evaluation today patient actually appears to be showing some signs of improvement in regard to both wound areas. I'm insurance on the medial aspect of his left lower extremity at the ankle whether or not the alginate may be sticking and causing some tearing off of new skin attempting to grow. With that being said the patient states it does not seem to be doing such just pulls up some of the dried dead skin around. Nonetheless this is something I wanted to watch out  for going forward and actually I recommended that he take the dressing off in the shower when you could get it completely wet before removal to see how this does. Subsequently in regard to the lateral ankle ulcer he still has slough noted at this point although I do believe that he is tolerating the Medihoney much better I wish you could have gotten the Santyl but unfortunately the Santyl was too expensive gonna cost him $250. 03/06/18 on evaluation today patient appears to be doing about the same in regard to both ulcers of his left medial and lateral  malleolus sites. He's been tolerating the dressing changes without complication. Unfortunately things do not seem to be improving in regard to either side at this time. Overall I think we may need to switch things up a little bit as far as the way we are performing the dressing changes currently. 03/27/18 on evaluation today patient's wound bed actually appears to be doing much better at both locations in regard to his ankle ulcers. He has been tolerating the dressing changes at this time without complication. Overall I'm very pleased with how things appear. The patient likewise states that he's much better in regard to discomfort at this time. 04/10/18 on evaluation today patient actually appears to be doing well in regard to his left lateral ankle ulcer. He has been tolerating the dressing changes without complication. Fortunately there does not appear to be any evidence of infection. Overall I do feel like he is tolerating the Medihoney without complication. 04/24/18 upon evaluation today patient's wound actually appears to be healed on the lateral aspect of his ankle the medial aspect still continues to remain close without any evidence of complication at this time. Overall I have been extremely pleased with how things have progressed up to this point. The patient likewise is very happy he's not having any significant pain this time. He does wears compression stocking on a regular basis. Readmission: 10-19-2021 upon evaluation today patient presents for evaluation here in the clinic concerning issues with a left lateral ulcer as well as a right lateral ulcer both along the aspect of his foot getting close to the ankle. This left area is the same place that I treated back in 2024. 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 This appears to be very inflamed and is also very painful. In the past he has been completely unable to tolerate the use of any  compression wraps unfortunately. There does not appear to be any evidence of active infection locally nor systemically at this time which is good news. I do believe however we need to try to see what we can do to calm down the inflammation I think triamcinolone topically as well as oral prednisone would likely be indicated in this case. Patient's medical history really has not changed significantly since I last saw him in actually 2019 though I think is stated 2020 above. 10-26-21 upon evaluation today patient appears to still be having a lot of irritation and inflammation in regard to the ankles laterally. The left is greater than the right. With that being said I do believe that he would benefit from a biopsy to confirm whether or not this may indeed be vasculitis. It actually seems like that physically and on examination but again I want to be sure that we are on the right track as far as treatment is concerned. The good news is he did not have  a DVT I called him about that on Friday this was all in regard to the right leg. Overall I am very pleased in that regard. 11-02-2021 upon evaluation today patient appears to be doing poorly still in regard to his left lower extremity. Apparently he had a fever on Sunday which was around 101 and he was severely sick feeling and disoriented according to his wife. She came with him today because she did not think that he would actually tell me what was going on. With that being said unfortunately he did not go to the hospital he actually has been doing a little better over the past few days he been taking amoxicillin he has not taken any other medications orally at this point that are different. He has not been on any antibiotics either more recently other than the amoxicillin. Nonetheless he has not had a repeat in the past 3 days of that issue. 11-09-2021 upon evaluation today patient presents after having had quite an ordeal over the past several days.  Subsequently yesterday I got a call from [place] here in [place] concerning the patient and the fact that he was having bleeding that been going on for the past 24 hours and was not stopping. Subsequently my advice was to have the patient go to the ER for further evaluation and treatment. It seems to me that he had a regular way part of the wound down to the point that a blood vessel and open. Apparently he was filling up trash bags wrapped around his foot with blood. In fact his lab review which I did do him as well today showed that he went from a white hemoglobin on 09-14-2021 of 13.6 down to a hemoglobin of 11.2 on 11-08-2021 this was yesterday. Subsequently this is due to the amount of blood loss that he has had acutely. Again I do not believe he is at transfusion stage but at the same time he is extremely weak and states that a couple times when he is going to stand up he is actually fallen back into his chair. Nonetheless I do believe that he probably needs to supplement with iron and I did recommend that there are some over-the-counter products he could get in this regard. Subsequently also suggested that the patient needs to be drinking plenty of water he apparently drinks Coke and he drinks coffee but that is about it. Subsequently following this conversation his wife was present during this time as well and I do think that he is going to try to drink more I think it is of utmost importance. With that being said I did review his PCR culture as well it was positive for 2 organisms. This was Pseudomonas and Enterobacter. Both were showing up as being very prevalent in the PCR culture and subsequently Cipro will take care of the situation which is what I am recommending at this point based on what we see. This will take the place of the Bactrim and can have him discontinue the Bactrim the only thing is he is getting need to stop taking the hydroxyzine if he has been taking it that is the Vistaril  and he tells me that he only takes this when he itches he has not been taking it recently. 6/7; patient arrives in clinic today with the wound on the right foot actually looking some better the area on the left lateral looking about the same. Problematically, he is having edema fluid leakage on the medial part of the  left foot and ankle causing skin breakdown but no open wound. He has been very resistant to the notion of compression wraps. He has been using Santyl on both wound areas at home changing the dressing himself Partway through our visit today it became clear he had had a fall on Sunday 3 days ago. Since then he has had a lot of problems with his right shoulder 11-23-2021 upon evaluation today patient appears to be doing well with regard to his wound. Has been tolerating dressing changes without complication. Fortunately there does not appear to be any signs of infection he was wrapped last week when Dr. Dellia Nims saw him on the left. He was having some breakdown medially which they were concerned about. He did keep the wrap on until Monday but states it got extremely wet. For that reason I am thinking of doing a nurse visit on Friday so that we can get some of the swelling down right now by Friday we should have a lot of that out that we can switch out and put on a fresh dressing to get him through till next Wednesday. He is in agreement with giving that a try. Electronic Signature(s) Signed: 11/23/2021 9:57:16 AM By: [name] PA-C Entered By: [name] on 11/23/2021 09:57:15 -------------------------------------------------------------------------------- Physical Exam Details Patient Name: Date of Service: [place], [name] 11/23/2021 8:45 A M Medical Record Number: [id number] Patient Account Number: [id number] Date of Birth/Sex: Treating RN: March 17, 1941 (81 y.o. [name] Primary Care Provider: [name] Other Clinician: Referring Provider: Treating  Provider/Extender: Azucena Kuba in Treatment: 5 Constitutional Well-nourished and well-hydrated in no acute distress. Respiratory normal breathing without difficulty. Psychiatric this patient is able to make decisions and demonstrates good insight into disease process. Alert and Oriented x 3. pleasant and cooperative. Notes Patient's wound bed did show some signs still necrotic tissue on the lateral aspect of the left leg which I do think is going to do quite well here to clean up with Iodoflex. I did recommend that today. In regard to the left leg as well I do want to initiate treatment with a compression wrap I think that a compression wrap changed out on Friday then should hopefully get him through till Wednesday. He will have much less drainage once we get the initial fluid out. Electronic Signature(s) Signed: 11/23/2021 9:58:01 AM By: [name] PA-C Entered By: [name] on 11/23/2021 09:58:01 -------------------------------------------------------------------------------- Physician Orders Details Patient Name: Date of Service: [place], [name] 11/23/2021 8:45 A M Medical Record Number: [id number] Patient Account Number: [id number] Date of Birth/Sex: Treating RN: 23-Oct-1940 (81 y.o. 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 - 11/30/21 @ 8:45 am with Bernette Redbird, Room 7) Return A Nurse Visit: - 11/25/21 @ 8:45am for wrap change Leveda Anna, Room 7) Bathing/ Shower/ Hygiene May shower with protection but do not get wound dressing(s)  wet. - May shower using cast protector bag from [facility] or [place] May shower and wash wound with soap and water. - wash with antibacterial soap when changing dressing Edema Control - Lymphedema / SCD / Other Elevate legs to the level of the heart or above for 30 minutes daily and/or when sitting, a frequency of: - throughout the day Patient to wear own compression stockings every day. - Use stocking daily to right leg Moisturize legs daily. - Eucerin (in jar) Additional Orders / Instructions Follow Nutritious Diet Non Wound Condition pply the following to affected area as directed: - Apply AgAlg to left medial foot under wrap A Wound Treatment Wound #6 - Foot Wound Laterality: Left, Lateral Cleanser: Soap and Water 1 x Per Week/30 Days Discharge Instructions: May shower and wash wound with dial antibacterial soap and water prior to dressing change. Peri-Wound Care: Triamcinolone 15 (g) 1 x Per Week/30 Days Discharge Instructions: Use triamcinolone 15 (g) as directed Peri-Wound Care: Zinc Oxide Ointment 30g tube 1 x Per Week/30 Days Discharge Instructions: Apply Zinc Oxide to periwound with each dressing change Peri-Wound Care: Sween Lotion (Moisturizing lotion) 1 x Per Week/30 Days Discharge Instructions: Apply moisturizing lotion as directed Prim Dressing: IODOFLEX 0.9% Cadexomer Iodine Pad 4x6 cm 1 x Per Week/30 Days ary Discharge Instructions: Apply to wound bed as instructed Secondary Dressing: ABD Pad, 5x9 1 x Per Week/30 Days Discharge Instructions: Apply over primary dressing as directed. Secondary Dressing: Zetuvit Plus 4x8 in 1 x Per Week/30 Days Discharge Instructions: Apply over primary dressing as  directed. Compression Wrap: ThreePress (3 layer compression wrap) 1 x Per Week/30 Days Discharge Instructions: Apply three layer compression as directed. Wound #7 - Foot Wound Laterality: Right, Lateral Cleanser: Soap and Water 1 x Per Day/30 Days Discharge Instructions: May shower and wash wound with dial antibacterial soap and water prior to dressing change. Secondary Dressing: Woven Gauze Sponges 2x2 in 1 x Per Day/30 Days Discharge Instructions: Apply over primary dressing as directed. Secondary Dressing: Proximel Silicone Border 5x5 1 x Per Day/30 Days Electronic Signature(s) Signed: 11/23/2021 5:11:12 PM By: [name] PA-C Signed: 11/23/2021 5:21:51 PM By: [name] Entered By: [name] on 11/23/2021 09:51:17 -------------------------------------------------------------------------------- Problem List Details Patient Name: Date of Service: [place], [name] 11/23/2021 8:45 A M Medical Record Number: [id number] Patient Account Number: [id number] Date of Birth/Sex: Treating RN: 04-Apr-1941 (81 y.o. 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 10/19/2021 No Yes L97.522 Non-pressure chronic ulcer of other part of left foot with fat layer exposed 10/19/2021 No Yes L97.512 Non-pressure chronic ulcer of other part of right foot with fat layer exposed 10/19/2021 No Yes E11.622 Type 2 diabetes mellitus with other skin ulcer 10/19/2021 No Yes Inactive Problems Resolved Problems Electronic Signature(s) Signed: 11/23/2021 8:44:21 AM By: [name] PA-C Previous Signature: 11/23/2021 8:31:38 AM Version By: [name] Entered By: [name] on 11/23/2021 08:44:21 -------------------------------------------------------------------------------- Progress Note Details Patient Name: Date of Service: [name], [name] 11/23/2021 8:45 A M Medical Record Number: [id number] Patient Account Number: [id number] Date of Birth/Sex: Treating RN: December 08, 1940 (81 y.o. [name] Primary Care Provider:  [name] Other Clinician: Referring Provider: Treating Provider/Extender: Azucena Kuba in Treatment: 5 Subjective Chief Complaint Information obtained from Patient Bilateral foot ulcers History of Present Illness (HPI) 81 year old gentleman known to have swelling of his left lower extremity with some ulceration along the medial ankle has been having these problems for the last 3-4 months and does not know how it came on. No history of injury or no history of any infection in this area. He is known to have diabetes mellitus controlled with diet, hyperlipidemia, hypertension and chronic lumbar back pain with sciatica which is being treated by his PCP. Last hemoglobin A1c was 6.3 in June 2017. Last medical history is significant for esophageal stricture, hiatal hernia, vitamin D deficiency, status post back surgery o3, hernia repair as a child for both groins, hiatal hernia repair, joint replacement and revision of a knee surgery on the right side. He has quit smoking in 1982. He has never had a Doppler study of his lower extremity except remotely when he had knee surgery they looked for a blood clot on the right side. 09/13/2016 -- venous reflux study done on 09/12/2016 showed there is evidence of greater saphenous vein reflux in the left lower extremity and the small saphenous vein and the posterior thigh is not competent. 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 With these  results he definitely needs a referral to the vascular surgeons 09/20/2016 -- he has an appointment to see the vascular surgeons on May 1 10/03/2016 -- patient had a 4 layer compression on his left lower extremity and had a lot of discomfort and pain. 10/11/2016-- was seen by Dr. Althea [name] -- review of his data he recommended staged laser ablation of his great and then small saphenous veins for reduction odd of his venous hypertension. He did examine his right leg with the SonoSite and he has dilatation of the saphenous vein on the right lower extremity too. Since there was no evidence of ulceration he recommended observation of the right lower extremity. He recommended to continue with local wound care at the wound center until his wound was completely healed. 10/18/2016 -- the patient has not been very compliant with his compression, his diet and his diuretics and as a result his lymphedema has increased markedly 10/25/2016 -- he has been compliant this week, has worn his 2 press compression wraps, taken his diuretics and is watching his salt intake. 11/08/2016 -- he had his venous procedure done last week and details of this have been reported and reviewed in his electronic medical record. he had a laser ablation of his great saphenous vein and this was done from mid calf to just below the saphenofemoral junction. He would return next week for ultrasound follow-up. He will then undergo the small saphenous vein ablation in a few weeks. 11/15/2016 -- his postprocedure visit showed good closure of his left great saphenous vein from the mid calf to 1-1/2 cm from the saphenofemoral junction. He had excellent early results from the ablation. The ablation of the left small saphenous vein was planned in a week 12/06/2016 -- he had his small saphenous vein venous ablation a week before and the duplex showed closure of his small saphenous vein to within half centimeter office saphenous popliteal junction  with no DVT and he was pleased with the results. He recommended continue with elevation and compression and to see them back on an as-needed basis 12/19/16; the patient's wound is  actually closed. Sometime over the weekend he developed a small abrasion on the lower calf just above the original wound on the left medial malleolus although this is closed as well Readmission: 01/18/18 on evaluation today patient presents after having last been seen in our clinic July 2018. Subsequently he is a reoccurrence of the ulcers on the left ankle both medial and now a new area lateral that had been present back room for about a month he tells me. He states that this has done very well for about a year he really does not know of any injury or anything that happened that would have caused this reopening at this point. He has continued to wear compression stockings which is appropriate. He does not have lymphedema pumps. He has continued to also keep the area clean and dry as best he could. With that being said now that is been reopened is been harder for him to take care of this as far as compression stockings are concerned keeping them clean along with continuing to manage his fluid and swelling. His ABI appears to be great on the left registering at 1.14 today. He has previously undergone a venous ablation. Other than compression/lymphedema pumps he's really done everything that he can to help manage and prevent these ulcers from forming. 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 01/30/18 on evaluation today patient appears to actually be doing very well in regard to his medial ankle ulcer. It's the lateral ulcer that actually is not doing quite as well today. Fortunately he did have some alginate left ovary start using this on the wounds and the medial ankle actually seems to be doing much better. The lateral ankle does actually need something to con a help with slough and buildup. The collagen really did not seem to do much  for him in that regard. 02/13/18 on evaluation today patient appears to be doing about the same in regard to his ulcers. Unfortunately I do not feel like were making good progress at this time. When I have debrided the wound the slough seems to come back fairly quickly in the lateral ankle ulcer. There does not appear to be any evidence of infection at this time. No fevers, chills, nausea, or vomiting noted at this time. 02/27/18 on evaluation today patient actually appears to be showing some signs of improvement in regard to both wound areas. I'm insurance on the medial aspect of his left lower extremity at the ankle whether or not the alginate may be sticking and causing some tearing off of new skin attempting to grow. With that being said the patient states it does not seem to be doing such just pulls up some of the dried dead skin around. Nonetheless this is something I wanted to watch out for going forward and actually I recommended that he take the dressing off in the shower when you could get it completely wet before removal to see how this does. Subsequently in regard to the lateral ankle ulcer he still has slough noted at this point although I do believe that he is tolerating the Medihoney much better I wish you could have gotten the Santyl but unfortunately the Santyl was too expensive gonna cost him $250. 03/06/18 on evaluation today patient appears to be doing about the same in regard to both ulcers of his left medial and lateral malleolus sites. He's been tolerating the dressing changes without complication. Unfortunately things do not seem to be improving in regard to either side at this  time. Overall I think we may need to switch things up a little bit as far as the way we are performing the dressing changes currently. 03/27/18 on evaluation today patient's wound bed actually appears to be doing much better at both locations in regard to his ankle ulcers. He has been tolerating the dressing  changes at this time without complication. Overall I'm very pleased with how things appear. The patient likewise states that he's much better in regard to discomfort at this time. 04/10/18 on evaluation today patient actually appears to be doing well in regard to his left lateral ankle ulcer. He has been tolerating the dressing changes without complication. Fortunately there does not appear to be any evidence of infection. Overall I do feel like he is tolerating the Medihoney without complication. 04/24/18 upon evaluation today patient's wound actually appears to be healed on the lateral aspect of his ankle the medial aspect still continues to remain close without any evidence of complication at this time. Overall I have been extremely pleased with how things have progressed up to this point. The patient likewise is very happy he's not having any significant pain this time. He does wears compression stocking on a regular basis. Readmission: 10-19-2021 upon evaluation today patient presents for evaluation here in the clinic concerning issues with a left lateral ulcer as well as a right lateral ulcer both along the aspect of his foot getting close to the ankle. This left area is the same place that I treated back in 2024. 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 This appears to be very inflamed and is also very painful. In the past he has been completely unable to tolerate the use of any compression wraps unfortunately. There does not appear to be any evidence of active infection locally nor systemically at this time which is good news. I do believe however we need to try to see what we can do to calm down the inflammation I think triamcinolone topically as well as oral prednisone would likely be indicated in this case. Patient's medical history really has not changed significantly since I last saw him in actually 2019 though I think is stated 2020  above. 10-26-21 upon evaluation today patient appears to still be having a lot of irritation and inflammation in regard to the ankles laterally. The left is greater than the right. With that being said I do believe that he would benefit from a biopsy to confirm whether or not this may indeed be vasculitis. It actually seems like that physically and on examination but again I want to be sure that we are on the right track as far as treatment is concerned. The good news is he did not have a DVT I called him about that on Friday this was all in regard to the right leg. Overall I am very pleased in that regard. 11-02-2021 upon evaluation today patient appears to be doing poorly still in regard to his left lower extremity. Apparently he had a fever on Sunday which was around 101 and he was severely sick feeling and disoriented according to his wife. She came with him today because she did not think that he would actually tell me what was going on. With that being said unfortunately he did not go to the hospital he actually has been doing a little better over the past few days he been taking amoxicillin he has not taken any other medications orally at  this point that are different. He has not been on any antibiotics either more recently other than the amoxicillin. Nonetheless he has not had a repeat in the past 3 days of that issue. 11-09-2021 upon evaluation today patient presents after having had quite an ordeal over the past several days. Subsequently yesterday I got a call from Barry here in [place] concerning the patient and the fact that he was having bleeding that been going on for the past 24 hours and was not stopping. Subsequently my advice was to have the patient go to the ER for further evaluation and treatment. It seems to me that he had a regular way part of the wound down to the point that a blood vessel and open. Apparently he was filling up trash bags wrapped around his foot with blood. In  fact his lab review which I did do him as well today showed that he went from a white hemoglobin on 09-14-2021 of 13.6 down to a hemoglobin of 11.2 on 11-08-2021 this was yesterday. Subsequently this is due to the amount of blood loss that he has had acutely. Again I do not believe he is at transfusion stage but at the same time he is extremely weak and states that a couple times when he is going to stand up he is actually fallen back into his chair. Nonetheless I do believe that he probably needs to supplement with iron and I did recommend that there are some over-the-counter products he could get in this regard. Subsequently also suggested that the patient needs to be drinking plenty of water he apparently drinks Coke and he drinks coffee but that is about it. Subsequently following this conversation his wife was present during this time as well and I do think that he is going to try to drink more I think it is of utmost importance. With that being said I did review his PCR culture as well it was positive for 2 organisms. This was Pseudomonas and Enterobacter. Both were showing up as being very prevalent in the PCR culture and subsequently Cipro will take care of the situation which is what I am recommending at this point based on what we see. This will take the place of the Bactrim and can have him discontinue the Bactrim the only thing is he is getting need to stop taking the hydroxyzine if he has been taking it that is the Vistaril and he tells me that he only takes this when he itches he has not been taking it recently. 6/7; patient arrives in clinic today with the wound on the right foot actually looking some better the area on the left lateral looking about the same. Problematically, he is having edema fluid leakage on the medial part of the left foot and ankle causing skin breakdown but no open wound. He has been very resistant to the notion of compression wraps. He has been using Santyl on both  wound areas at home changing the dressing himself Partway through our visit today it became clear he had had a fall on Sunday 3 days ago. Since then he has had a lot of problems with his right shoulder 11-23-2021 upon evaluation today patient appears to be doing well with regard to his wound. Has been tolerating dressing changes without complication. Fortunately there does not appear to be any signs of infection he was wrapped last week when Dr. Dellia Nims saw him on the left. He was having some breakdown medially which they were concerned about.  He did keep the wrap on until Monday but states it got extremely wet. For that reason I am thinking of doing a nurse visit on Friday so that we can get some of the swelling down right now by Friday we should have a lot of that out that we can switch out and put on a fresh dressing to get him through till next Wednesday. He is in agreement with giving that a try. Objective Constitutional Well-nourished and well-hydrated in no acute distress. Vitals Time Taken: 8:41 AM, Height: 66 in, Weight: 184 lbs, BMI: 29.7, Temperature: 97.9 F, Pulse: 84 bpm, Respiratory Rate: 18 breaths/min, Blood Pressure: 132/67 mmHg. Respiratory normal breathing without difficulty. Psychiatric this patient is able to make decisions and demonstrates good insight into disease process. Alert and Oriented x 3. pleasant and cooperative. General Notes: Patient's wound bed did show some signs still necrotic tissue on the lateral aspect of the left leg which I do think is going to do quite well here to clean up with Iodoflex. I did recommend that today. In regard to the left leg as well I do want to initiate treatment with a compression wrap I think that a compression wrap changed out on Friday then should hopefully get him through till Wednesday. He will have much less drainage once we get the initial fluid out. Integumentary (Hair, Skin) Wound #6 status is Open. Original cause of wound  was Gradually Appeared. The date acquired was: 07/25/2021. The wound has been in treatment 5 weeks. The wound is located on the Left,Lateral Foot. The wound measures 6.8cm length x 5cm width x 0.2cm depth; 26.704cm^2 area and 5.341cm^3 volume. There is Fat Layer (Subcutaneous Tissue) exposed. There is no tunneling or undermining noted. There is a large amount of purulent drainage noted. The wound margin is distinct with the outline attached to the wound base. There is small (1-33%) red granulation within the wound bed. There is a large (67-100%) amount of necrotic tissue within the wound bed. Wound #7 status is Open. Original cause of wound was Gradually Appeared. The date acquired was: 06/20/2021. The wound has been in treatment 5 weeks. The wound is located on the Right,Lateral Foot. The wound measures 0.2cm length x 0.2cm width x 0.1cm depth; 0.031cm^2 area and 0.003cm^3 volume. There is Fat Layer (Subcutaneous Tissue) exposed. There is no tunneling or undermining noted. There is a medium amount of serosanguineous drainage noted. The wound margin is distinct with the outline attached to the wound base. There is large (67-100%) pink, pale granulation within the wound bed. There is a small (1- 33%) amount of necrotic tissue within the wound bed. 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 There was a Three Layer Compression Therapy Procedure by [name], RN. Post procedure Diagnosis Wound #6: Same as  Pre-Procedure Plan Follow-up Appointments: Return Appointment in 1 week. - 11/30/21 @ 8:45 am with Bernette Redbird, Room 7) Nurse Visit: - 11/25/21 @ 8:45am for wrap change Leveda Anna, Room 7) Bathing/ Shower/ Hygiene: May shower with protection but do not get wound dressing(s) wet. - May shower using cast protector  bag from [facility], [facility] or [employer] May shower and wash wound with soap and water. - wash with antibacterial soap when changing dressing Edema Control - Lymphedema / SCD / Other: Elevate legs to the level of the heart or above for 30 minutes daily and/or when sitting, a frequency of: - throughout the day Patient to wear own compression stockings every day. - Use stocking daily to right leg Moisturize legs daily. - Eucerin (in jar) Additional Orders / Instructions: Follow Nutritious Diet Non Wound Condition: Apply the following to affected area as directed: - Apply AgAlg to left medial foot under wrap WOUND #6: - Foot Wound Laterality: Left, Lateral Cleanser: Soap and Water 1 x Per Week/30 Days Discharge Instructions: May shower and wash wound with dial antibacterial soap and water prior to dressing change. Peri-Wound Care: Triamcinolone 15 (g) 1 x Per Week/30 Days Discharge Instructions: Use triamcinolone 15 (g) as directed Peri-Wound Care: Zinc Oxide Ointment 30g tube 1 x Per Week/30 Days Discharge Instructions: Apply Zinc Oxide to periwound with each dressing change Peri-Wound Care: Sween Lotion (Moisturizing lotion) 1 x Per Week/30 Days Discharge Instructions: Apply moisturizing lotion as directed Prim Dressing: IODOFLEX 0.9% Cadexomer Iodine Pad 4x6 cm 1 x Per Week/30 Days ary Discharge Instructions: Apply to wound bed as instructed Secondary Dressing: ABD Pad, 5x9 1 x Per Week/30 Days Discharge Instructions: Apply over primary dressing as directed. Secondary Dressing: Zetuvit Plus 4x8 in 1 x Per Week/30 Days Discharge Instructions: Apply over primary dressing as directed. Com  pression Wrap: ThreePress (3 layer compression wrap) 1 x Per Week/30 Days Discharge Instructions: Apply three layer compression as directed. WOUND #7: - Foot Wound Laterality: Right, Lateral Cleanser: Soap and Water 1 x Per Day/30 Days Discharge Instructions: May shower and wash wound with dial antibacterial soap and water prior to dressing change. Secondary Dressing: Woven Gauze Sponges 2x2 in 1 x Per Day/30 Days Discharge Instructions: Apply over primary dressing as directed. Secondary Dressing: Proximel Silicone Border 5x5 1 x Per Day/30 Days 1. I would recommend that we go ahead and continue with the wound care measures as before using Iodoflex and left lateral leg. 2. I am also can recommend that we use a compression wrap on the left leg. We will get a be doing a 3 layer compression wrap. 3. I am also can recommend that we have the patient continue with the Band-Aid and compression sock on the right leg which I think is good to be better than the Ace wrap at all for more standard and uniform compression. We will see patient back for reevaluation in 1 week here in the clinic. If anything worsens or changes patient will contact our office for additional recommendations. Electronic Signature(s) Signed: 11/23/2021 9:58:33 AM By: [name] PA-C Entered By: [name] on 11/23/2021 09:58:33 -------------------------------------------------------------------------------- SuperBill Details Patient Name: Date of Service: [place], [name] 11/23/2021 Medical Record Number: [id number] Patient Account Number: [id number] Date of Birth/Sex: Treating RN: [birth year] (82 y.o. 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 L97.522 Non-pressure chronic ulcer of other part of left foot with fat layer exposed L97.512 Non-pressure chronic ulcer of other part of right foot with fat layer exposed E11.622 Type 2  diabetes mellitus with other skin ulcer Facility Procedures CPT4 Code: [id number] Description: (Facility Use Only) [id number] - [facility] PPGFQM LWR LT LEG ICD-10 Diagnosis Description L97.522 Non-pressure chronic ulcer of other part of left foot with fat layer exposed Modifier: Quantity: 1 Physician Procedures : CPT4 Code Description Modifier [id number] - WC PHYS LEVEL 3 - EST PT ICD-10 Diagnosis Description I87.333 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 extremity I89.0 Lymphedema, not elsewhere classified  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

## 2021-11-23 NOTE — Progress Notes (Addendum)
UTHMAN, MROCZKOWSKI ([id number]) Visit Report for 11/23/2021 Arrival Information Details Patient Name: Date of Service: [place], [name] 11/23/2021 8:45 A M Medical Record Number: [id number] Patient Account Number: [id number] Date of Birth/Sex: Treating RN: Oct 25, 1940 (81 y.o. [name] Primary Care Areeb Corron: [name] Other Clinician: Referring Janvi Ammar: Treating Anela Bensman/Extender: Azucena Kuba in Treatment: 5 Visit Information History Since Last Visit Added or deleted any medications: No Patient Arrived: [name] Any new allergies or adverse reactions: No Arrival Time: 08:32 Had a fall or experienced change in No Accompanied By: wife activities of daily living that may affect Transfer Assistance: None risk of falls: Patient Identification Verified: Yes Signs or symptoms of abuse/neglect since last visito No Secondary Verification Process Completed: Yes Hospitalized since last visit: No Patient Requires Transmission-Based Precautions: No Implantable device outside of the clinic excluding No Patient Has Alerts: No cellular tissue based products placed in the center since last visit: Has Dressing in Place as Prescribed: Yes Has Compression in Place as Prescribed: No Pain Present Now: Yes Notes Patient removed compression wrap a few days ago. Electronic Signature(s) Signed: 11/23/2021 8:33:02 AM By: [name] Entered By: [name] on 11/23/2021 08:33:02 -------------------------------------------------------------------------------- Compression Therapy Details Patient Name: Date of Service: [place], [name] 11/23/2021 8:45 A M Medical Record Number: [id number] Patient Account Number: [id number] Date of Birth/Sex: Treating RN: [birth year] (81 y.o. [name] Primary Care Cage Gupton: [name] Other Clinician: Referring Carling Liberman: Treating Dala Breault/Extender: Azucena Kuba in Treatment:  5 Compression Therapy Performed for Wound Assessment: Wound #6 Left,Lateral Foot Performed By: Clinician [name], RN Compression Type: Three Layer Post Procedure Diagnosis Same as Pre-procedure Electronic Signature(s) Signed: 11/23/2021 5:21:51 PM By: [name] Entered By: [name] on 11/23/2021 09:51:40 -------------------------------------------------------------------------------- Encounter Discharge Information Details Patient Name: Date of Service: [place], [name] 11/23/2021 8:45 A M Medical Record Number: [id number] Patient Account Number: [id number] Date of Birth/Sex: Treating RN: 07-26-1940 (81 y.o. [name] Primary Care Anterio Scheel: [name] Other Clinician: Referring Omri Bertran: Treating Biddie Sebek/Extender: Azucena Kuba in Treatment: 5 Encounter Discharge Information Items Discharge Condition: Stable Ambulatory Status: Cane Discharge Destination: Home Transportation: Private Auto Accompanied By: wife Schedule Follow-up Appointment: Yes Clinical Summary of Care: Provided on 11/23/2021 Form Type Recipient Paper Patient Patient Electronic Signature(s) Signed: 11/23/2021 5:21:51 PM By: [name] Entered By: [name] on 11/23/2021 09:53:28 -------------------------------------------------------------------------------- Lower Extremity Assessment Details Patient Name: Date of Service: [place], [name] 11/23/2021 8:45 A M Medical Record Number: [id number] Patient Account Number: [id number] Date of Birth/Sex: Treating RN: [birth year] (81 y.o. [name] Primary Care Jarae Nemmers: [name] Other Clinician: Referring Sergey Ishler: Treating Cayli Escajeda/Extender: Marcos Eke Weeks in Treatment: 5 Edema Assessment Assessed: [Left: Yes] Patrice Paradise: Yes] Edema: [Left: Yes] [Right: Yes] Calf Left: Right: Point of Measurement: 32 cm From Medial Instep 46.5 cm 43 cm Ankle Left:  Right: Point of Measurement: 10 cm From Medial Instep 26 cm 26.4 cm Vascular Assessment Pulses: Dorsalis Pedis Palpable: [Left:Yes] [Right:Yes] Electronic Signature(s) Signed: 11/23/2021 5:21:51 PM By: [name] Entered By: [name] on 11/23/2021 08:52:59 -------------------------------------------------------------------------------- Multi-Disciplinary Care Plan Details Patient Name: Date of Service: [place], [name] 11/23/2021 8:45 A M Medical Record Number: [id number] Patient Account Number: [id number] Date of Birth/Sex: Treating RN: 1941/01/16 (81 y.o. 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 Compression as ordered Notes: 11/16/21: Edema not controlled, patient not compliant with compression. Wound/Skin Impairment Nursing Diagnoses: Impaired tissue integrity Goals: Patient/caregiver will verbalize understanding of skin care regimen Date Initiated: 10/19/2021 Target Resolution Date: 12/14/2021 Goal Status: Active Ulcer/skin breakdown will have a volume reduction of 30% by week 4 Date Initiated: 10/19/2021 Target Resolution Date: 12/14/2021 Goal Status: Active Interventions: Assess patient/caregiver ability to obtain necessary supplies Assess patient/caregiver ability to perform ulcer/skin care regimen upon admission and as needed Assess ulceration(s) every visit Provide education on ulcer and skin care Treatment Activities: Topical wound management initiated : 10/19/2021 Notes: 11/16/21: Wound care continues, patient not  compliant with edema control. Electronic Signature(s) Signed: 11/23/2021 8:31:51 AM By: [name] Entered By: [name] on 11/23/2021 08:31:51 -------------------------------------------------------------------------------- Pain Assessment Details Patient Name: Date of Service: [place], [name] 11/23/2021 8:45 A M Medical Record Number: [id number] Patient Account Number: [id number] Date of Birth/Sex: Treating RN: [birth year] (81 y.o. [name] Primary Care Yomara Toothman: Other Clinician: [name] Referring Raizel Wesolowski: Treating Fiorela Pelzer/Extender: Azucena Kuba in Treatment: 5 Active Problems Location of Pain Severity and Description of Pain Patient Has Paino Yes Site Locations Pain Location: Pain in Ulcers With Dressing Change: Yes Duration of the Pain. Constant / Intermittento Intermittent Rate the pain. Current Pain Level: 7 Character of Pain Describe the Pain: Aching, Burning, Throbbing Pain Management and Medication Current Pain Management: Medication: Yes Cold Application: No Rest: Yes Massage: No Activity: No T.E.N.S.: No Heat Application: No Leg drop or elevation: No Is the Current Pain Management Adequate: Adequate How does your wound impact your activities of daily livingo Sleep: Yes Bathing: No Appetite: No Relationship With Others: No Bladder Continence: No Emotions: No Bowel Continence: No Work: No Toileting: No Drive: No Dressing: No Hobbies: No Electronic Signature(s) Signed: 11/23/2021 5:21:51 PM By: [name] Entered By: [name] on 11/23/2021 08:43:05 -------------------------------------------------------------------------------- Patient/Caregiver Education Details Patient Name: Date of Service: [place] [name] 6/14/2023andnbsp8:45 A M Medical Record Number: [id number] Patient Account Number: [id number] Date of Birth/Gender: Treating RN: 06-15-1940 (81 y.o. [name] Primary Care Physician: [name] Other Clinician: Referring Physician: Treating Physician/Extender: Azucena Kuba in Treatment: 5 Education Assessment Education Provided To: Patient Education Topics Provided Venous: Methods: Explain/Verbal, Printed Responses: Reinforcements needed, State content correctly Wound/Skin Impairment: Methods: Demonstration, Explain/Verbal, Printed Responses: State content correctly Electronic Signature(s) Signed: 11/23/2021 5:21:51 PM By: [name] Entered By: [name] on 11/23/2021 08:32:21 -------------------------------------------------------------------------------- Wound Assessment Details Patient Name: Date of Service: [name], [name] 11/23/2021 8:45 A M Medical Record Number: [id number] Patient Account Number: [id number] Date of Birth/Sex: Treating RN: 24-May-1941 (81 y.o. 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, Lateral Foot Wound Open Wounding Event: Gradually Appeared Status: Date Acquired: 07/25/2021 Comorbid Anemia, Chronic Obstructive Pulmonary Disease (COPD), Weeks Of Treatment: 5 History: Hypertension, Peripheral Venous Disease, Type II Diabetes, Clustered Wound: No Osteoarthritis, Neuropathy Photos Wound Measurements Length: (cm) 6.8 Width: (cm) 5 Depth: (cm) 0.2 Area: (cm) 26.704  Volume: (cm) 5.341 % Reduction in Area: -37.1% % Reduction in Volume: -37.1% Epithelialization: None Tunneling: No Undermining: No Wound Description Classification: Full Thickness Without Exposed Support Structures Wound Margin: Distinct, outline attached Exudate Amount: Large Exudate Type: Purulent Exudate Color: yellow, brown, green Foul Odor After Cleansing:  No Slough/Fibrino Yes Wound Bed Granulation Amount: Small (1-33%) Exposed Structure Granulation Quality: Red Fascia Exposed: No Necrotic Amount: Large (67-100%) Fat Layer (Subcutaneous Tissue) Exposed: Yes Tendon Exposed: No Muscle Exposed: No Joint Exposed: No Bone Exposed: No Treatment Notes Wound #6 (Foot) Wound Laterality: Left, Lateral Cleanser Soap and Water Discharge Instruction: May shower and wash wound with dial antibacterial soap and water prior to dressing change. Peri-Wound Care Triamcinolone 15 (g) Discharge Instruction: Use triamcinolone 15 (g) as directed Zinc Oxide Ointment 30g tube Discharge Instruction: Apply Zinc Oxide to periwound with each dressing change Sween Lotion (Moisturizing lotion) Discharge Instruction: Apply moisturizing lotion as directed Topical Primary Dressing IODOFLEX 0.9% Cadexomer Iodine Pad 4x6 cm Discharge Instruction: Apply to wound bed as instructed Secondary Dressing ABD Pad, 5x9 Discharge Instruction: Apply over primary dressing as directed. Zetuvit Plus 4x8 in Discharge Instruction: Apply over primary dressing as directed. Secured With Compression Wrap ThreePress (3 layer compression wrap) Discharge Instruction: Apply three layer compression as directed. Compression Stockings Add-Ons Electronic Signature(s) Signed: 11/23/2021 5:21:51 PM By: [name] Entered By: [name] on 11/23/2021 09:37:55 -------------------------------------------------------------------------------- Wound Assessment Details Patient Name: Date of Service: [place], [name] 11/23/2021 8:45 A M Medical Record Number: [id number] Patient Account Number: [id number] Date of Birth/Sex: Treating RN: 01-07-1941 (81 y.o. [name] Primary Care Tyreek Clabo: [name] Other Clinician: Referring Jamorian Dimaria: Treating Makana Rostad/Extender: Azucena Kuba in Treatment: 5 Wound Status Wound Number: 7 Primary  Vasculitis Etiology: Wound Location: Right, Lateral Foot Wound Open Wounding Event: Gradually Appeared Status: Date Acquired: 06/20/2021 Comorbid Anemia, Chronic Obstructive Pulmonary Disease (COPD), Weeks Of Treatment: 5 History: Hypertension, Peripheral Venous Disease, Type II Diabetes, Clustered Wound: No Osteoarthritis, Neuropathy Photos Wound Measurements Length: (cm) 0.2 Width: (cm) 0.2 Depth: (cm) 0.1 Area: (cm) 0.031 Volume: (cm) 0.003 % Reduction in Area: 67% % Reduction in Volume: 89.3% Epithelialization: Small (1-33%) Tunneling: No Undermining: No Wound Description Classification: Full Thickness Without Exposed Support Structures Wound Margin: Distinct, outline attached Exudate Amount: Medium Exudate Type: Serosanguineous Exudate Color: red, brown Foul Odor After Cleansing: No Slough/Fibrino Yes Wound Bed Granulation Amount: Large (67-100%) Exposed Structure Granulation Quality: Pink, Pale Fascia Exposed: No Necrotic Amount: Small (1-33%) Fat Layer (Subcutaneous Tissue) Exposed: Yes Tendon Exposed: No Muscle Exposed: No Joint Exposed: No Bone Exposed: No Treatment Notes Wound #7 (Foot) Wound Laterality: Right, Lateral Cleanser Soap and Water Discharge Instruction: May shower and wash wound with dial antibacterial soap and water prior to dressing change. Peri-Wound Care Topical Primary Dressing Secondary Dressing Woven Gauze Sponges 2x2 in Discharge Instruction: Apply over primary dressing as directed. Proximel Silicone Border 5x5 Secured With Compression Wrap Compression Stockings Environmental education officer) Signed: 11/23/2021 5:21:51 PM By: [name] Entered By: [name] on 11/23/2021 09:37:31 -------------------------------------------------------------------------------- Vitals Details Patient Name: Date of Service: [name], [name] 11/23/2021 8:45 A M Medical Record Number: [id number] Patient Account Number:  [id number] Date of Birth/Sex: Treating RN: April 29, 1941 (81 y.o. [name] Primary Care Kwame Ryland: [name] Other Clinician: Referring Anyi Fels: Treating Amarissa Koerner/Extender: Azucena Kuba in Treatment: 5 Vital Signs Time Taken: 08:41 Temperature (F): 97.9 Height (in): 66 Pulse (bpm): 84 Weight (lbs): 184  Respiratory Rate (breaths/min): 18 Body Mass Index (BMI): 29.7 Blood Pressure (mmHg): 132/67 Reference Range: 80 - 120 mg / dl Electronic Signature(s) Signed: 11/23/2021 5:21:51 PM By: [name] Entered By: [name] on 11/23/2021 08:42:31

## 2021-11-25 ENCOUNTER — Encounter (HOSPITAL_BASED_OUTPATIENT_CLINIC_OR_DEPARTMENT_OTHER): Payer: Medicare HMO | Admitting: Internal Medicine

## 2021-11-25 DIAGNOSIS — E11622 Type 2 diabetes mellitus with other skin ulcer: Secondary | ICD-10-CM | POA: Diagnosis not present

## 2021-11-25 DIAGNOSIS — L97522 Non-pressure chronic ulcer of other part of left foot with fat layer exposed: Secondary | ICD-10-CM | POA: Diagnosis not present

## 2021-11-25 DIAGNOSIS — Z87891 Personal history of nicotine dependence: Secondary | ICD-10-CM | POA: Diagnosis not present

## 2021-11-25 DIAGNOSIS — I890 Lymphedema, not elsewhere classified: Secondary | ICD-10-CM | POA: Diagnosis not present

## 2021-11-25 DIAGNOSIS — I87333 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 extremity: Secondary | ICD-10-CM | POA: Diagnosis not present

## 2021-11-25 DIAGNOSIS — L958 Other vasculitis limited to the skin: Secondary | ICD-10-CM | POA: Diagnosis not present

## 2021-11-25 DIAGNOSIS — L97512 Non-pressure chronic ulcer of other part of right foot with fat layer exposed: Secondary | ICD-10-CM | POA: Diagnosis not present

## 2021-11-25 NOTE — Progress Notes (Signed)
YOGESH, COMINSKY ([id number]) Visit Report for 11/25/2021 Arrival Information Details Patient Name: Date of Service: [place], [name] 11/25/2021 8:45 A M Medical Record Number: [id number] Patient Account Number: [id number] Date of Birth/Sex: Treating RN: Mar 19, 1941 (81 y.o. [name] Primary Care Tenleigh Byer: [name] Other Clinician: Referring Tamora Huneke: Treating Allysia Ingles/Extender: Georgina Quint in Treatment: 5 Visit Information History Since Last Visit Added or deleted any medications: No Patient Arrived: [name] Any new allergies or adverse reactions: No Arrival Time: 08:37 Had a fall or experienced change in No Accompanied By: wife activities of daily living that may affect Transfer Assistance: None risk of falls: Patient Requires Transmission-Based Precautions: No Signs or symptoms of abuse/neglect since No Patient Has Alerts: No last visito Hospitalized since last visit: No Implantable device outside of the clinic No excluding cellular tissue based products placed in the center since last visit: Has Dressing in Place as Prescribed: Yes Has Compression in Place as Prescribed: Yes Has Footwear/Offloading in Place as Yes Prescribed: Left: Surgical Shoe with Pressure Relief Insole Pain Present Now: Yes Electronic Signature(s) Signed: 11/25/2021 11:45:44 AM By: Erenest Blank Entered By: Erenest Blank on 11/25/2021 08:41:11 -------------------------------------------------------------------------------- Compression Therapy Details Patient Name: Date of Service: [place], [name] 11/25/2021 8:45 A M Medical Record Number: [id number] Patient Account Number: [id number] Date of Birth/Sex: Treating RN: Oct 13, 1940 (81 y.o. [name] Primary Care Zoeann Mol: [name] Other Clinician: Referring Fallynn Gravett: Treating Kenitra Leventhal/Extender: Valeta Harms Weeks in Treatment: 5 Compression Therapy Performed for  Wound Assessment: Wound #6 Left,Lateral Foot Performed By: Clinician [name], RN Compression Type: Three Layer Electronic Signature(s) Signed: 11/25/2021 12:37:51 PM By: [name] Entered By: [name] on 11/25/2021 10:03:34 -------------------------------------------------------------------------------- Encounter Discharge Information Details Patient Name: Date of Service: [place], [name] 11/25/2021 8:45 A M Medical Record Number: [id number] Patient Account Number: [id number] Date of Birth/Sex: Treating RN: 1941-01-03 (81 y.o. [name] Primary Care Burnis Kaser: [name] Other Clinician: Referring Dantre Yearwood: Treating Josephmichael Lisenbee/Extender: Georgina Quint in Treatment: 5 Encounter Discharge Information Items Discharge Condition: Stable Ambulatory Status: Cane Discharge Destination: Home Transportation: Private Auto Accompanied By: wife Schedule Follow-up Appointment: Yes Clinical Summary of Care: Patient Declined Electronic Signature(s) Signed: 11/25/2021 12:37:51 PM By: [name] Entered By: [name] on 11/25/2021 10:04:15 -------------------------------------------------------------------------------- Patient/Caregiver Education Details Patient Name: Date of Service: [place] [name] 6/16/2023andnbsp8:45 A M Medical Record Number: [id number] Patient Account Number: [id number] Date of Birth/Gender: Treating RN: [birth year] (81 y.o. [name] Primary Care Physician: [name] Other Clinician: Referring Physician: Treating Physician/Extender: Georgina Quint in Treatment: 5 Education Assessment Education Provided To: Patient Education Topics Provided Venous: Methods: Explain/Verbal, Printed Responses: State content correctly Wound/Skin Impairment: Methods: Explain/Verbal, Printed Responses: State content correctly Electronic Signature(s) Signed: 11/25/2021  12:37:51 PM By: [name] Entered By: [name] on 11/25/2021 10:03:58 -------------------------------------------------------------------------------- Wound Assessment Details Patient Name: Date of Service: [name], [name] 11/25/2021 8:45 A M Medical Record Number: [id number] Patient Account Number: [id number] Date of Birth/Sex: Treating RN: 1940-12-04 (81 y.o. [name] Primary Care Laya Letendre: [name] Other Clinician: Referring Luanna Weesner: Treating Quinlyn Tep/Extender: Valeta Harms Weeks in Treatment: 5 Wound Status Wound Number: 6 Primary Vasculitis Etiology: Wound Location: Left, Lateral Foot Wound Open Wounding Event: Gradually Appeared Status: Date Acquired: 07/25/2021 Comorbid Anemia, Chronic Obstructive Pulmonary Disease (COPD), Weeks Of Treatment: 5 History: Hypertension, Peripheral Venous Disease,  Type II Diabetes, Clustered Wound: No Osteoarthritis, Neuropathy Wound Measurements Length: (cm) 6.8 Width: (cm) 5 Depth: (cm) 0.2 Area: (cm) 26.704 Volume: (cm) 5.341 % Reduction in Area: -37.1% % Reduction in Volume: -37.1% Epithelialization: None Tunneling: No Undermining: No Wound Description Classification: Full Thickness Without Exposed Support Structures Wound Margin: Distinct, outline attached Exudate Amount: Large Exudate Type: Purulent Exudate Color: yellow, brown, green Foul Odor After Cleansing: No Slough/Fibrino Yes Wound Bed Granulation Amount: Small (1-33%) Exposed Structure Granulation Quality: Red Fascia Exposed: No Necrotic Amount: Large (67-100%) Fat Layer (Subcutaneous Tissue) Exposed: Yes Tendon Exposed: No Muscle Exposed: No Joint Exposed: No Bone Exposed: No Treatment Notes Wound #6 (Foot) Wound Laterality: Left, Lateral Cleanser Soap and Water Discharge Instruction: May shower and wash wound with dial antibacterial soap and water prior to dressing change. Peri-Wound  Care Triamcinolone 15 (g) Discharge Instruction: Use triamcinolone 15 (g) as directed Zinc Oxide Ointment 30g tube Discharge Instruction: Apply Zinc Oxide to periwound with each dressing change Sween Lotion (Moisturizing lotion) Discharge Instruction: Apply moisturizing lotion as directed Topical Primary Dressing IODOFLEX 0.9% Cadexomer Iodine Pad 4x6 cm Discharge Instruction: Apply to wound bed as instructed Secondary Dressing ABD Pad, 5x9 Discharge Instruction: Apply over primary dressing as directed. Zetuvit Plus 4x8 in Discharge Instruction: Apply over primary dressing as directed. Secured With Compression Wrap ThreePress (3 layer compression wrap) Discharge Instruction: Apply three layer compression as directed. Compression Stockings Add-Ons Electronic Signature(s) Signed: 11/25/2021 12:37:51 PM By: [name] Entered By: [name] on 11/25/2021 10:03:11 -------------------------------------------------------------------------------- Vitals Details Patient Name: Date of Service: [name], [name] 11/25/2021 8:45 A M Medical Record Number: [id number] Patient Account Number: [id number] Date of Birth/Sex: Treating RN: Nov 18, 1940 (81 y.o. [name] Primary Care Domanik Rainville: [name] Other Clinician: Referring Ramon Zanders: Treating Marielis Samara/Extender: Valeta Harms Weeks in Treatment: 5 Vital Signs Time Taken: 08:41 Temperature (F): 97.7 Height (in): 66 Pulse (bpm): 75 Weight (lbs): 184 Respiratory Rate (breaths/min): 19 Body Mass Index (BMI): 29.7 Blood Pressure (mmHg): 145/80 Reference Range: 80 - 120 mg / dl Electronic Signature(s) Signed: 11/25/2021 11:45:44 AM By: Erenest Blank Entered By: Erenest Blank on 11/25/2021 08:41:32

## 2021-11-25 NOTE — Progress Notes (Signed)
[name] Fernandez, INSKEEP ([id number]) Visit Report for 11/25/2021 SuperBill Details Patient Name: Date of Service: [place], [name] 11/25/2021 Medical Record Number: [id number] Patient Account Number: [id number] Date of Birth/Sex: Treating RN: [birth year] (81 y.o. 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

## 2021-11-30 ENCOUNTER — Encounter (HOSPITAL_BASED_OUTPATIENT_CLINIC_OR_DEPARTMENT_OTHER): Payer: Medicare HMO | Admitting: Physician Assistant

## 2021-11-30 DIAGNOSIS — L97522 Non-pressure chronic ulcer of other part of left foot with fat layer exposed: Secondary | ICD-10-CM | POA: Diagnosis not present

## 2021-11-30 DIAGNOSIS — I890 Lymphedema, not elsewhere classified: Secondary | ICD-10-CM | POA: Diagnosis not present

## 2021-11-30 DIAGNOSIS — I87333 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 extremity: Secondary | ICD-10-CM | POA: Diagnosis not present

## 2021-11-30 DIAGNOSIS — E11622 Type 2 diabetes mellitus with other skin ulcer: Secondary | ICD-10-CM | POA: Diagnosis not present

## 2021-11-30 DIAGNOSIS — L97512 Non-pressure chronic ulcer of other part of right foot with fat layer exposed: Secondary | ICD-10-CM | POA: Diagnosis not present

## 2021-11-30 DIAGNOSIS — L958 Other vasculitis limited to the skin: Secondary | ICD-10-CM | POA: Diagnosis not present

## 2021-11-30 DIAGNOSIS — Z87891 Personal history of nicotine dependence: Secondary | ICD-10-CM | POA: Diagnosis not present

## 2021-11-30 NOTE — Progress Notes (Signed)
KWALI, WRINKLE ([id number]) Visit Report for 11/30/2021 Arrival Information Details Patient Name: Date of Service: [place], [name] 11/30/2021 9:30 A M Medical Record Number: [id number] Patient Account Number: [id number] Date of Birth/Sex: Treating RN: 1941/05/30 (81 y.o. [name] Primary Care Andy Allende: [name] Other Clinician: Referring Selim Durden: Treating Shadell Brenn/Extender: Azucena Kuba in Treatment: 6 Visit Information History Since Last Visit Added or deleted any medications: No Patient Arrived: Ambulatory Any new allergies or adverse reactions: No Arrival Time: 09:43 Had a fall or experienced change in No Accompanied By: wife activities of daily living that may affect Transfer Assistance: None risk of falls: Patient Identification Verified: Yes Signs or symptoms of abuse/neglect since No Secondary Verification Process Completed: Yes last visito Patient Requires Transmission-Based Precautions: No Hospitalized since last visit: No Patient Has Alerts: No Implantable device outside of the clinic No excluding cellular tissue based products placed in the center since last visit: Has Dressing in Place as Prescribed: Yes Has Compression in Place as Prescribed: No Has Footwear/Offloading in Place as Yes Prescribed: Left: Surgical Shoe with Pressure Relief Insole Pain Present Now: Yes Notes took wrap off yesterday Electronic Signature(s) Signed: 11/30/2021 4:35:31 PM By: [name] Entered By: [name] on 11/30/2021 09:47:55 -------------------------------------------------------------------------------- Encounter Discharge Information Details Patient Name: Date of Service: [name], [name] 11/30/2021 9:30 A M Medical Record Number: [id number] Patient Account Number: [id number] Date of Birth/Sex: Treating RN: [birth year] (81 y.o. [name] Primary Care Sigismund Cross: [name] Other Clinician: Referring  Carman Auxier: Treating Lace Chenevert/Extender: Azucena Kuba in Treatment: 6 Encounter Discharge Information Items Post Procedure Vitals Discharge Condition: Stable Temperature (F): 98.6 Ambulatory Status: Ambulatory Pulse (bpm): 76 Discharge Destination: Home Respiratory Rate (breaths/min): 18 Transportation: Private Auto Blood Pressure (mmHg): 155/68 Accompanied By: wife Schedule Follow-up Appointment: Yes Clinical Summary of Care: Provided on 11/30/2021 Form Type Recipient Paper Patient Patient Electronic Signature(s) Signed: 11/30/2021 4:35:31 PM By: [name] Entered By: [name] on 11/30/2021 10:44:02 -------------------------------------------------------------------------------- Lower Extremity Assessment Details Patient Name: Date of Service: [place], [name] 11/30/2021 9:30 A M Medical Record Number: [id number] Patient Account Number: [id number] Date of Birth/Sex: Treating RN: 05-20-1941 (81 y.o. [name] Primary Care Estephania Licciardi: [name] Other Clinician: Referring Andren Bethea: Treating Kinslie Hove/Extender: Azucena Kuba in Treatment: 6 Edema Assessment Assessed: [Left: Yes] Patrice Paradise: Yes] Edema: [Left: Yes] [Right: Yes] Calf Left: Right: Point of Measurement: 32 cm From Medial Instep 39.4 cm 38.7 cm Ankle Left: Right: Point of Measurement: 10 cm From Medial Instep 25 cm 25.5 cm Vascular Assessment Pulses: Dorsalis Pedis Palpable: [Left:Yes] [Right:Yes] Electronic Signature(s) Signed: 11/30/2021 4:35:31 PM By: [name] Entered By: [name] on 11/30/2021 09:58:38 -------------------------------------------------------------------------------- Multi Wound Chart Details Patient Name: Date of Service: [name], [place] [name] 11/30/2021 9:30 A M Medical Record Number: [id number] Patient Account Number: [id number] Date of Birth/Sex: Treating RN: 28-Jun-1940 (81 y.o. [name] Primary Care Meoshia Billing: [name] Other Clinician: Referring Zeph Riebel: Treating Sheronda Parran/Extender: Azucena Kuba in Treatment: 6 Vital Signs Height(in): 89 Pulse(bpm): 72 Weight(lbs): 184 Blood Pressure(mmHg): 155/68 Body Mass Index(BMI): 29.7 Temperature(F): 98.6 Respiratory Rate(breaths/min): 18 Photos: Photos: [N/A:N/A] Left, Lateral Foot Right, Lateral Foot N/A Wound Location: Gradually Appeared Gradually Appeared N/A Wounding Event: Vasculitis Vasculitis N/A Primary Etiology: Anemia, Chronic Obstructive Anemia, Chronic Obstructive N/A Comorbid History: Pulmonary Disease (COPD), Pulmonary Disease (COPD), Hypertension, Peripheral Venous Hypertension, Peripheral  Venous Disease, Type II Diabetes, Disease, Type II Diabetes, Osteoarthritis, Neuropathy Osteoarthritis, Neuropathy 07/25/2021 06/20/2021 N/A Date Acquired: 6 6 N/A Weeks of Treatment: Open Healed - Epithelialized N/A Wound Status: No No N/A Wound Recurrence: 6.2x5.8x0.2 0x0x0 N/A Measurements L x W x D (cm) 28.243 0 N/A A (cm) : rea 5.649 0 N/A Volume (cm) : -45.00% 100.00% N/A % Reduction in A rea: -45.00% 100.00% N/A % Reduction in Volume: Full Thickness Without Exposed Full Thickness Without Exposed N/A Classification: Support Structures Support Structures Large N/A N/A Exudate A mount: Purulent N/A N/A Exudate Type: yellow, brown, green N/A N/A Exudate Color: Distinct, outline attached N/A N/A Wound Margin: Medium (34-66%) N/A N/A Granulation A mount: Red, Pink N/A N/A Granulation Quality: Medium (34-66%) N/A N/A Necrotic A mount: Fat Layer (Subcutaneous Tissue): Yes N/A N/A Exposed Structures: Fascia: No Tendon: No Muscle: No Joint: No Bone: No None N/A N/A Epithelialization: Chemical/Enzymatic/Mechanical N/A N/A Debridement: Pre-procedure Verification/Time Out 10:24 N/A N/A Taken: N/A N/A N/A Instrument: None N/A  N/A Bleeding: Procedure was tolerated well N/A N/A Debridement Treatment Response: 6.2x5.8x0.2 N/A N/A Post Debridement Measurements L x W x D (cm) 5.649 N/A N/A Post Debridement Volume: (cm) Debridement N/A N/A Procedures Performed: Treatment Notes Wound #6 (Foot) Wound Laterality: Left, Lateral Cleanser Soap and Water Discharge Instruction: May shower and wash wound with dial antibacterial soap and water prior to dressing change. Peri-Wound Care Zinc Oxide Ointment 30g tube Discharge Instruction: Apply Zinc Oxide to periwound with each dressing change Topical Primary Dressing Santyl Ointment Discharge Instruction: Apply nickel thick amount to wound bed as instructed Secondary Dressing ABD Pad, 5x9 Discharge Instruction: Apply over primary dressing as directed. Woven Gauze Sponge, Non-Sterile 4x4 in Discharge Instruction: Apply over primary dressing as directed. Secured With Elastic Bandage 4 inch (ACE bandage) Discharge Instruction: Secure with ACE bandage as directed. Kerlix Roll Sterile, 4.5x3.1 (in/yd) Discharge Instruction: Secure with Kerlix as directed. 73M Medipore Soft Cloth Surgical T 2x10 (in/yd) ape Discharge Instruction: Secure with tape as directed. Compression Wrap Elastic 4" Bandage (ACE) Discharge Instruction: Apply from base of toes to just below knee Compression Stockings Add-Ons Electronic Signature(s) Signed: 11/30/2021 2:15:46 PM By: [name] Entered By: [name] on 11/30/2021 14:15:45 -------------------------------------------------------------------------------- [place] Details Patient Name: Date of Service: [place], [name] 11/30/2021 9:30 A M Medical Record Number: [id number] Patient Account Number: [id number] Date of Birth/Sex: Treating RN: 03-11-1941 (81 y.o. [name] Primary Care Teara Duerksen: [name] Other Clinician: Referring Dekota Kirlin: Treating Barrett Holthaus/Extender: Azucena Kuba in Treatment: 6 Active Inactive Venous Leg Ulcer Nursing Diagnoses: Actual venous Insuffiency (use after diagnosis is confirmed) Goals: Patient will maintain optimal edema control Date Initiated: 10/19/2021 Target Resolution Date: 12/14/2021 Goal Status: Active Interventions: Assess peripheral edema status every visit. Compression as ordered Notes: 11/16/21: Edema not controlled, patient not compliant with compression. Wound/Skin Impairment Nursing Diagnoses: Impaired tissue integrity Goals: Patient/caregiver will verbalize understanding of skin care regimen Date Initiated: 10/19/2021 Target Resolution Date: 12/14/2021 Goal Status: Active Ulcer/skin breakdown will have a volume reduction of 30% by week 4 Date Initiated: 10/19/2021 Target Resolution Date: 12/14/2021 Goal Status: Active Interventions: Assess patient/caregiver ability to obtain necessary supplies Assess patient/caregiver ability to perform ulcer/skin care regimen upon admission and as needed Assess ulceration(s) every visit Provide education on ulcer and skin care Treatment Activities: Topical wound management initiated : 10/19/2021 Notes: 11/16/21: Wound care continues, patient not compliant with edema control. Electronic Signature(s) Signed: 11/30/2021 4:35:31 PM By: Onnie Boer,  Lenna Sciara By: [name] on 11/30/2021 09:43:10 -------------------------------------------------------------------------------- Pain Assessment Details Patient Name: Date of Service: [place], [place] LD C. 11/30/2021 9:30 A M Medical Record Number: [id number] Patient Account Number: [id number] Date of Birth/Sex: Treating RN: 03-27-1941 (81 y.o. [name] Primary Care Shivan Hodes: [name] Other Clinician: Referring Edmundo Tedesco: Treating Yaquelin Langelier/Extender: Azucena Kuba in Treatment: 6 Active Problems Location of Pain Severity and Description of Pain Patient Has Paino  Yes Site Locations Pain Location: Pain in Ulcers With Dressing Change: Yes Duration of the Pain. Constant / Intermittento Constant Rate the pain. Current Pain Level: 6 Character of Pain Describe the Pain: Burning, Tender, Throbbing Pain Management and Medication Current Pain Management: Medication: Yes Cold Application: No Rest: Yes Massage: No Activity: No T.E.N.S.: No Heat Application: No Leg drop or elevation: No Is the Current Pain Management Adequate: Adequate How does your wound impact your activities of daily livingo Sleep: No Bathing: No Appetite: No Relationship With Others: No Bladder Continence: No Emotions: No Bowel Continence: No Work: No Toileting: No Drive: No Dressing: No Hobbies: No Electronic Signature(s) Signed: 11/30/2021 4:35:31 PM By: [name] Entered By: [name] on 11/30/2021 09:48:52 -------------------------------------------------------------------------------- Patient/Caregiver Education Details Patient Name: Date of Service: [place] [name] 6/21/2023andnbsp9:30 A M Medical Record Number: [id number] Patient Account Number: [id number] Date of Birth/Gender: Treating RN: [birth year] (81 y.o. [name] Primary Care Physician: [name] Other Clinician: Referring Physician: Treating Physician/Extender: Azucena Kuba in Treatment: 6 Education Assessment Education Provided To: Patient Education Topics Provided Venous: Methods: Explain/Verbal, Printed Responses: State content correctly Wound/Skin Impairment: Methods: Explain/Verbal, Printed Responses: State content correctly Electronic Signature(s) Signed: 11/30/2021 4:35:31 PM By: [name] Entered By: [name] on 11/30/2021 09:43:36 -------------------------------------------------------------------------------- Wound Assessment Details Patient Name: Date of Service: [name], [place] [name] 11/30/2021 9:30 A  M Medical Record Number: [id number] Patient Account Number: [id number] Date of Birth/Sex: Treating RN: February 23, 1941 (81 y.o. 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, Lateral Foot Wound Open Wounding Event: Gradually Appeared Status: Date Acquired: 07/25/2021 Comorbid Anemia, Chronic Obstructive Pulmonary Disease (COPD), Weeks Of Treatment: 6 History: Hypertension, Peripheral Venous Disease, Type II Diabetes, Clustered Wound: No Osteoarthritis, Neuropathy Photos Wound Measurements Length: (cm) 6.2 Width: (cm) 5.8 Depth: (cm) 0.2 Area: (cm) 28.243 Volume: (cm) 5.649 % Reduction in Area: -45% % Reduction in Volume: -45% Epithelialization: None Tunneling: No Undermining: No Wound Description Classification: Full Thickness Without Exposed Support Struct Wound Margin: Distinct, outline attached Exudate Amount: Large Exudate Type: Purulent Exudate Color: yellow, brown, green ures Foul Odor After Cleansing: No Slough/Fibrino Yes Wound Bed Granulation Amount: Medium (34-66%) Exposed Structure Granulation Quality: Red, Pink Fascia Exposed: No Necrotic Amount: Medium (34-66%) Fat Layer (Subcutaneous Tissue) Exposed: Yes Necrotic Quality: Adherent Slough Tendon Exposed: No Muscle Exposed: No Joint Exposed: No Bone Exposed: No Treatment Notes Wound #6 (Foot) Wound Laterality: Left, Lateral Cleanser Soap and Water Discharge Instruction: May shower and wash wound with dial antibacterial soap and water prior to dressing change. Peri-Wound Care Zinc Oxide Ointment 30g tube Discharge Instruction: Apply Zinc Oxide to periwound with each dressing change Topical Primary Dressing Santyl Ointment Discharge Instruction: Apply nickel thick amount to wound bed as  instructed Secondary Dressing ABD Pad, 5x9 Discharge Instruction: Apply over primary dressing as directed. Woven  Gauze Sponge, Non-Sterile 4x4 in Discharge Instruction: Apply over primary dressing as directed. Secured With Elastic Bandage 4 inch (ACE bandage) Discharge Instruction: Secure with ACE bandage as directed. Kerlix Roll Sterile, 4.5x3.1 (in/yd) Discharge Instruction: Secure with Kerlix as directed. 11M Medipore Soft Cloth Surgical T 2x10 (in/yd) ape Discharge Instruction: Secure with tape as directed. Compression Wrap Elastic 4" Bandage (ACE) Discharge Instruction: Apply from base of toes to just below knee Compression Stockings Add-Ons Electronic Signature(s) Signed: 11/30/2021 4:35:31 PM By: [name] Entered By: [name] on 11/30/2021 09:54:07 -------------------------------------------------------------------------------- Wound Assessment Details Patient Name: Date of Service: [name], [place] [name] 11/30/2021 9:30 A M Medical Record Number: [id number] Patient Account Number: [id number] Date of Birth/Sex: Treating RN: [birth year] (81 y.o. [name] Primary Care Jermiyah Ricotta: [name] Other Clinician: Referring Lissandra Keil: Treating Iris Hairston/Extender: Azucena Kuba in Treatment: 6 Wound Status Wound Number: 7 Primary Vasculitis Etiology: Wound Location: Right, Lateral Foot Wound Healed - Epithelialized Wounding Event: Gradually Appeared Status: Date Acquired: 06/20/2021 Comorbid Anemia, Chronic Obstructive Pulmonary Disease (COPD), Weeks Of Treatment: 6 History: Hypertension, Peripheral Venous Disease, Type II Diabetes, Clustered Wound: No Osteoarthritis, Neuropathy Photos Wound Measurements Length: (cm) Width: (cm) Depth: (cm) Area: (cm) Volume: (cm) 0 % Reduction in Area: 100% 0 % Reduction in Volume: 100% 0 0 0 Wound Description Classification: Full Thickness Without Exposed Support  Structur es Electronic Signature(s) Signed: 11/30/2021 4:35:31 PM By: [name] Entered By: [name] on 11/30/2021 10:26:36 -------------------------------------------------------------------------------- [place] Details Patient Name: Date of Service: [name], [place] [name] 11/30/2021 9:30 A M Medical Record Number: [id number] Patient Account Number: [id number] Date of Birth/Sex: Treating RN: 1941-01-02 (81 y.o. [name] Primary Care Ivey Cina: [name] Other Clinician: Referring Nyilah Kight: Treating Sonnet Rizor/Extender: Azucena Kuba in Treatment: 6 Vital Signs Time Taken: 09:47 Temperature (F): 98.6 Height (in): 66 Pulse (bpm): 76 Weight (lbs): 184 Respiratory Rate (breaths/min): 18 Body Mass Index (BMI): 29.7 Blood Pressure (mmHg): 155/68 Reference Range: 80 - 120 mg / dl Electronic Signature(s) Signed: 11/30/2021 4:35:31 PM By: [name] Entered By: [name] on 11/30/2021 09:48:14

## 2021-11-30 NOTE — Progress Notes (Addendum)
[name] Fernandez, [name] Fernandez ([id number]) Visit Report for 11/30/2021 Chief Complaint Document Details Patient Name: Date of Service: [name] Fernandez, [name] Fernandez. 11/30/2021 9:30 A M Medical Record Number: [id number] Patient Account Number: [id number] Date of Birth/Sex: Treating RN: 1940-08-01 (81 y.o. [name] Fernandez Primary Care Provider: Kirtland Fernandez Other Clinician: Referring Provider: Treating Provider/Extender: [name] Fernandez in Treatment: 6 Information Obtained from: Patient Chief Complaint Bilateral foot ulcers Electronic Signature(s) Signed: 11/30/2021 9:31:38 AM By: [name] Keeler PA-Fernandez Entered By: [name] Fernandez on 11/30/2021 09:31:37 -------------------------------------------------------------------------------- Debridement Details Patient Name: Date of Service: [name] Fernandez, [name] Fernandez. 11/30/2021 9:30 A M Medical Record Number: [id number] Patient Account Number: [id number] Date of Birth/Sex: Treating RN: 01-01-1941 (81 y.o. [name] Fernandez Primary Care Provider: Kirtland Fernandez Other Clinician: Referring Provider: Treating Provider/Extender: [name] Fernandez in Treatment: 6 Debridement Performed for Assessment: Wound #6 Left,Lateral Foot Performed By: Physician [name] Keeler, PA Debridement Type: Chemical/Enzymatic/Mechanical Agent Used: Santyl Level of Consciousness (Pre-procedure): Awake and Alert Pre-procedure Verification/Time Out Yes - 10:24 Taken: Start Time: 10:25 Bleeding: None Response to Treatment: Procedure was tolerated well Level of Consciousness (Post- Awake and Alert procedure): Post Debridement Measurements of Total Wound Length: (cm) 6.2 Width: (cm) 5.8 Depth: (cm) 0.2 Volume: (cm) 5.649 Character of Wound/Ulcer Post Debridement: Stable Post Procedure Diagnosis Same as Pre-procedure Electronic Signature(s) Signed: 11/30/2021 4:35:31 PM By: [name] Fernandez Signed: 11/30/2021 5:09:34 PM By: [name] Keeler  PA-Fernandez Entered By: [name] Fernandez on 11/30/2021 10:27:35 -------------------------------------------------------------------------------- HPI Details Patient Name: Date of Service: [name] Fernandez, [name] Fernandez. 11/30/2021 9:30 A M Medical Record Number: [id number] Patient Account Number: [id number] Date of Birth/Sex: Treating RN: 04-Dec-1940 (81 y.o. [name] Fernandez Primary Care Provider: Kirtland Fernandez Other Clinician: Referring Provider: Treating Provider/Extender: [name] Fernandez in Treatment: 6 History of Present Illness HPI Description: 81 year old gentleman known to have swelling of his left lower extremity with some ulceration along the medial ankle has been having these problems for the last 3-4 months and does not know how it came on. No history of injury or no history of any infection in this area. He is known to have diabetes mellitus controlled with diet, hyperlipidemia, hypertension and chronic lumbar back pain with sciatica which is being treated by his PCP. Last hemoglobin A1c was 6.3 in June 2017. Last medical history is significant for esophageal stricture, hiatal hernia, vitamin D deficiency, status post back surgery 3, hernia repair as a child for both groins, hiatal hernia repair, joint replacement and revision of a knee surgery on the right side. He has quit smoking in 1982. He has never had a Doppler study of his lower extremity except remotely when he had knee surgery they looked for a blood clot on the right side. 09/13/2016 -- venous reflux study done on 09/12/2016 showed there is evidence of greater saphenous vein reflux in the left lower extremity and the small saphenous vein and the posterior thigh is not competent. 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 With these results he definitely needs a referral to the vascular surgeons 09/20/2016 -- he has an appointment to see the vascular surgeons on May 1 10/03/2016 -- patient had a  4 layer compression on his left lower extremity and had a lot of discomfort and pain. 10/11/2016-- was seen by Dr. Althea Fernandez -- review of his data he recommended staged laser ablation of his great and  then small saphenous veins for reduction odd of his venous hypertension. He did examine his right leg with the SonoSite and he has dilatation of the saphenous vein on the right lower extremity too. Since there was no evidence of ulceration he recommended observation of the right lower extremity. He recommended to continue with local wound care at the wound center until his wound was completely healed. 10/18/2016 -- the patient has not been very compliant with his compression, his diet and his diuretics and as a result his lymphedema has increased markedly 10/25/2016 -- he has been compliant this week, has worn his 2 press compression wraps, taken his diuretics and is watching his salt intake. 11/08/2016 -- he had his venous procedure done last week and details of this have been reported and reviewed in his electronic medical record. he had a laser ablation of his great saphenous vein and this was done from mid calf to just below the saphenofemoral junction. He would return next week for ultrasound follow-up. He will then undergo the small saphenous vein ablation in a few weeks. 11/15/2016 -- his postprocedure visit showed good closure of his left great saphenous vein from the mid calf to 1-1/2 cm from the saphenofemoral junction. He had excellent early results from the ablation. The ablation of the left small saphenous vein was planned in a week 12/06/2016 -- he had his small saphenous vein venous ablation a week before and the duplex showed closure of his small saphenous vein to within half centimeter office saphenous popliteal junction with no DVT and he was pleased with the results. He recommended continue with elevation and compression and to see them back on an as-needed basis 12/19/16; the patient's  wound is actually closed. Sometime over the weekend he developed a small abrasion on the lower calf just above the original wound on the left medial malleolus although this is closed as well Readmission: 01/18/18 on evaluation today patient presents after having last been seen in our clinic July 2018. Subsequently he is a reoccurrence of the ulcers on the left ankle both medial and now a new area lateral that had been present back room for about a month he tells me. He states that this has done very well for about a year he really does not know of any injury or anything that happened that would have caused this reopening at this point. He has continued to wear compression stockings which is appropriate. He does not have lymphedema pumps. He has continued to also keep the area clean and dry as best he could. With that being said now that is been reopened is been harder for him to take care of this as far as compression stockings are concerned keeping them clean along with continuing to manage his fluid and swelling. His ABI appears to be great on the left registering at 1.14 today. He has previously undergone a venous ablation. Other than compression/lymphedema pumps he's really done everything that he can to help manage and prevent these ulcers from forming. 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 01/30/18 on evaluation today patient appears to actually be doing very well in regard to his medial ankle ulcer. It's the lateral ulcer that actually is not doing quite as well today. Fortunately he did have some alginate left ovary start using this on the wounds and the medial ankle actually seems to be doing much better. The lateral ankle does actually need something to con a help with slough and buildup. The collagen really did not  seem to do much for him in that regard. 02/13/18 on evaluation today patient appears to be doing about the same in regard to his ulcers. Unfortunately I do not feel like were making good  progress at this time. When I have debrided the wound the slough seems to come back fairly quickly in the lateral ankle ulcer. There does not appear to be any evidence of infection at this time. No fevers, chills, nausea, or vomiting noted at this time. 02/27/18 on evaluation today patient actually appears to be showing some signs of improvement in regard to both wound areas. I'm insurance on the medial aspect of his left lower extremity at the ankle whether or not the alginate may be sticking and causing some tearing off of new skin attempting to grow. With that being said the patient states it does not seem to be doing such just pulls up some of the dried dead skin around. Nonetheless this is something I wanted to watch out for going forward and actually I recommended that he take the dressing off in the shower when you could get it completely wet before removal to see how this does. Subsequently in regard to the lateral ankle ulcer he still has slough noted at this point although I do believe that he is tolerating the Medihoney much better I wish you could have gotten the Santyl but unfortunately the Santyl was too expensive gonna cost him $250. 03/06/18 on evaluation today patient appears to be doing about the same in regard to both ulcers of his left medial and lateral malleolus sites. He's been tolerating the dressing changes without complication. Unfortunately things do not seem to be improving in regard to either side at this time. Overall I think we may need to switch things up a little bit as far as the way we are performing the dressing changes currently. 03/27/18 on evaluation today patient's wound bed actually appears to be doing much better at both locations in regard to his ankle ulcers. He has been tolerating the dressing changes at this time without complication. Overall I'm very pleased with how things appear. The patient likewise states that he's much better in regard to discomfort at  this time. 04/10/18 on evaluation today patient actually appears to be doing well in regard to his left lateral ankle ulcer. He has been tolerating the dressing changes without complication. Fortunately there does not appear to be any evidence of infection. Overall I do feel like he is tolerating the Medihoney without complication. 04/24/18 upon evaluation today patient's wound actually appears to be healed on the lateral aspect of his ankle the medial aspect still continues to remain close without any evidence of complication at this time. Overall I have been extremely pleased with how things have progressed up to this point. The patient likewise is very happy he's not having any significant pain this time. He does wears compression stocking on a regular basis. Readmission: 10-19-2021 upon evaluation today patient presents for evaluation here in the clinic concerning issues with a left lateral ulcer as well as a right lateral ulcer both along the aspect of his foot getting close to the ankle. This left area is the same place that I treated back in 2024. 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 This appears to be very inflamed and is also very painful. In the past he has been completely unable to tolerate the use of any compression wraps unfortunately. There  does not appear to be any evidence of active infection locally nor systemically at this time which is good news. I do believe however we need to try to see what we can do to calm down the inflammation I think triamcinolone topically as well as oral prednisone would likely be indicated in this case. Patient's medical history really has not changed significantly since I last saw him in actually 2019 though I think is stated 2020 above. 10-26-21 upon evaluation today patient appears to still be having a lot of irritation and inflammation in regard to the ankles laterally. The left is greater than the right.  With that being said I do believe that he would benefit from a biopsy to confirm whether or not this may indeed be vasculitis. It actually seems like that physically and on examination but again I want to be sure that we are on the right track as far as treatment is concerned. The good news is he did not have a DVT I called him about that on Friday this was all in regard to the right leg. Overall I am very pleased in that regard. 11-02-2021 upon evaluation today patient appears to be doing poorly still in regard to his left lower extremity. Apparently he had a fever on Sunday which was around 101 and he was severely sick feeling and disoriented according to his wife. She came with him today because she did not think that he would actually tell me what was going on. With that being said unfortunately he did not go to the hospital he actually has been doing a little better over the past few days he been taking amoxicillin he has not taken any other medications orally at this point that are different. He has not been on any antibiotics either more recently other than the amoxicillin. Nonetheless he has not had a repeat in the past 3 days of that issue. 11-09-2021 upon evaluation today patient presents after having had quite an ordeal over the past several days. Subsequently yesterday I got a call from [place] here in [place] concerning the patient and the fact that he was having bleeding that been going on for the past 24 hours and was not stopping. Subsequently my advice was to have the patient go to the ER for further evaluation and treatment. It seems to me that he had a regular way part of the wound down to the point that a blood vessel and open. Apparently he was filling up trash bags wrapped around his foot with blood. In fact his lab review which I did do him as well today showed that he went from a white hemoglobin on 09-14-2021 of 13.6 down to a hemoglobin of 11.2 on 11-08-2021 this was  yesterday. Subsequently this is due to the amount of blood loss that he has had acutely. Again I do not believe he is at transfusion stage but at the same time he is extremely weak and states that a couple times when he is going to stand up he is actually fallen back into his chair. Nonetheless I do believe that he probably needs to supplement with iron and I did recommend that there are some over-the-counter products he could get in this regard. Subsequently also suggested that the patient needs to be drinking plenty of water he apparently drinks Coke and he drinks coffee but that is about it. Subsequently following this conversation his wife was present during this time as well and I do think that he is  going to try to drink more I think it is of utmost importance. With that being said I did review his PCR culture as well it was positive for 2 organisms. This was Pseudomonas and Enterobacter. Both were showing up as being very prevalent in the PCR culture and subsequently Cipro will take care of the situation which is what I am recommending at this point based on what we see. This will take the place of the Bactrim and can have him discontinue the Bactrim the only thing is he is getting need to stop taking the hydroxyzine if he has been taking it that is the Vistaril and he tells me that he only takes this when he itches he has not been taking it recently. 6/7; patient arrives in clinic today with the wound on the right foot actually looking some better the area on the left lateral looking about the same. Problematically, he is having edema fluid leakage on the medial part of the left foot and ankle causing skin breakdown but no open wound. He has been very resistant to the notion of compression wraps. He has been using Santyl on both wound areas at home changing the dressing himself Partway through our visit today it became clear he had had a fall on Sunday 3 days ago. Since then he has had a lot of  problems with his right shoulder 11-23-2021 upon evaluation today patient appears to be doing well with regard to his wound. Has been tolerating dressing changes without complication. Fortunately there does not appear to be any signs of infection he was wrapped last week when Dr. Dellia Nims saw him on the left. He was having some breakdown medially which they were concerned about. He did keep the wrap on until Monday but states it got extremely wet. For that reason I am thinking of doing a nurse visit on Friday so that we can get some of the swelling down right now by Friday we should have a lot of that out that we can switch out and put on a fresh dressing to get him through till next Wednesday. He is in agreement with giving that a try. 11-30-2021 upon evaluation today patient actually appears to be doing better with regard to the overall appearance of his wound. Fortunately there does not appear to be any signs of active infection locally or systemically which is great news. With that being said he has been tolerating the dressing changes without complication other than the fact that from Wednesday to Friday he did well with the compression wrap from Friday till yesterday he tells me the drainage was so bad and the smell was so bad he had to take this off. It does appear that he is very macerated around the lower portion down around the heel and I do think that this is an issue. Unfortunately we cannot get home health to help with this currently. Electronic Signature(s) Signed: 11/30/2021 12:53:24 PM By: [name] Keeler PA-Fernandez Entered By: [name] Fernandez on 11/30/2021 12:53:24 -------------------------------------------------------------------------------- Physical Exam Details Patient Name: Date of Service: [name] Fernandez, [name] Fernandez. 11/30/2021 9:30 A M Medical Record Number: [id number] Patient Account Number: [id number] Date of Birth/Sex: Treating RN: 24-Jul-1940 (81 y.o. [name] Fernandez Primary Care  Provider: Kirtland Fernandez Other Clinician: Referring Provider: Treating Provider/Extender: [name] Fernandez in Treatment: 6 Constitutional Well-nourished and well-hydrated in no acute distress. Respiratory normal breathing without difficulty. Psychiatric this patient is able to make decisions and demonstrates  good insight into disease process. Alert and Oriented x 3. pleasant and cooperative. Notes Upon inspection patient's wound bed actually showed signs of necrotic debris. His infection seems to be significantly improved which is great news and I am very pleased in that regard. With that being said I do believe that he is going to need continued and ongoing treatment here and I do believe that he would benefit from the use of the Santyl daily to that end and considering amount of drainage that he has not been having changed this daily with Santyl and looking to have him use an Ace wrap to secure and to help with compression. Electronic Signature(s) Signed: 11/30/2021 12:53:59 PM By: [name] Keeler PA-Fernandez Entered By: [name] Fernandez on 11/30/2021 12:53:59 -------------------------------------------------------------------------------- Physician Orders Details Patient Name: Date of Service: [name] Fernandez, [name] Fernandez. 11/30/2021 9:30 A M Medical Record Number: [id number] Patient Account Number: [id number] Date of Birth/Sex: Treating RN: 10-Jul-1940 (81 y.o. 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 - 12/07/21 @ 8:00am and 12/14/21 @ 9:30am with Bernette Redbird, Room 7) Return A Bathing/ Shower/ Hygiene May shower and wash wound with soap and water. - wash with antibacterial soap when changing dressing Edema Control - Lymphedema / SCD / Other Elevate legs to the level of the heart or above for 30 minutes daily and/or when sitting, a frequency of: - throughout the day Patient to wear own compression stockings every day. - Use stocking daily to right leg Moisturize legs daily. - Eucerin (in jar) Additional Orders / Instructions Follow Nutritious Diet Non Wound Condition pply the following to affected area as directed: - Apply Hydrofera Blue to left medial. A Wound Treatment Wound #6 - Foot Wound Laterality: Left, Lateral Cleanser: Soap and Water 1 x Per Day/30 Days Discharge Instructions: May shower and wash wound with dial antibacterial soap and water prior to dressing change. Peri-Wound Care: Zinc Oxide Ointment 30g tube 1 x Per Day/30 Days Discharge Instructions: Apply Zinc Oxide to periwound with each dressing change Prim Dressing: Santyl Ointment 1 x Per Day/30 Days ary Discharge Instructions: Apply nickel thick amount to wound bed as instructed Secondary Dressing: ABD Pad, 5x9 1 x Per Day/30 Days Discharge Instructions: Apply over primary dressing as directed. Secondary Dressing: Woven Gauze Sponge, Non-Sterile 4x4 in 1 x Per Day/30 Days Discharge Instructions: Apply over primary dressing as directed. Secured With: Elastic Bandage 4 inch (ACE bandage) 1 x Per Day/30 Days Discharge Instructions: Secure with ACE bandage as directed. Secured With: [employer], 4.5x3.1 (in/yd) 1 x Per Day/30 Days Discharge Instructions: Secure with Kerlix as directed. Secured With: 58M [employer] T 2x10 (in/yd) 1 x Per Day/30 Days ape Discharge Instructions: Secure with tape as directed. Compression Wrap: Elastic 4" Bandage (ACE) 1 x Per Day/30 Days Discharge Instructions: Apply from base of toes to just below knee Electronic Signature(s) Signed: 11/30/2021 4:35:31 PM By: [name] Fernandez Signed:  11/30/2021 5:09:34 PM By: [name] Keeler PA-Fernandez Entered By: [name] Fernandez on 11/30/2021 10:30:12 -------------------------------------------------------------------------------- Problem List Details Patient Name: Date of Service: [name] Fernandez, [name] Fernandez. 11/30/2021 9:30 A M Medical Record Number: [id number] Patient Account Number: [id number] Date of Birth/Sex: Treating RN: 13-Nov-1940 (81 y.o. 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 10/19/2021 No Yes L97.522 Non-pressure chronic ulcer of other part of left foot with fat layer exposed 10/19/2021 No Yes L97.512 Non-pressure chronic ulcer of other part of right foot with fat layer exposed 10/19/2021 No Yes E11.622 Type 2 diabetes mellitus with other skin ulcer 10/19/2021 No Yes Inactive Problems Resolved Problems Electronic Signature(s) Signed: 11/30/2021 9:31:27 AM By: [name] Keeler PA-Fernandez Entered By: [name] Fernandez on 11/30/2021 09:31:26 -------------------------------------------------------------------------------- Progress Note Details Patient Name: Date of Service: [name] Fernandez, [name] Fernandez. 11/30/2021 9:30 A M Medical Record Number: [id number] Patient Account Number: [id number] Date of  Birth/Sex: Treating RN: 08-30-1940 (81 y.o. [name] Fernandez Primary Care Provider: Kirtland Fernandez Other Clinician: Referring Provider: Treating Provider/Extender: [name] Fernandez in Treatment: 6 Subjective Chief Complaint Information obtained from Patient Bilateral foot ulcers History of Present Illness (HPI) 81 year old gentleman known to have swelling of his left lower extremity with some ulceration along the medial ankle has been having these problems for the last 3-4 months and does not know how it came on. No history of injury or no history of any infection in this area. He is known to have diabetes mellitus controlled with diet, hyperlipidemia, hypertension and chronic lumbar back pain with sciatica which is being treated by his PCP. Last hemoglobin A1c was 6.3 in June 2017. Last medical history is significant for esophageal stricture, hiatal hernia, vitamin D deficiency, status post back surgery o3, hernia repair as a child for both groins, hiatal hernia repair, joint replacement and revision of a knee surgery on the right side. He has quit smoking in 1982. He has never had a Doppler study of his lower extremity except remotely when he had knee surgery they looked for a blood clot on the right side. 09/13/2016 -- venous reflux study done on 09/12/2016 showed there is evidence of greater saphenous vein reflux in the left lower extremity and the small saphenous vein and the posterior thigh is not competent. 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 With these results he definitely needs a referral to the vascular surgeons 09/20/2016 -- he has an appointment to see the vascular surgeons on May 1 10/03/2016 -- patient had a 4 layer compression on his left lower extremity and had a lot of discomfort and pain. 10/11/2016-- was seen by Dr. Althea Fernandez -- review of his data he recommended staged laser ablation of his great and then small saphenous  veins for reduction odd of his venous hypertension. He did examine his right leg with the SonoSite and he has dilatation of the saphenous vein on the right lower extremity too. Since there was no evidence of ulceration he recommended observation of the right lower extremity. He recommended to continue with local wound care at the wound center until his wound was completely  healed. 10/18/2016 -- the patient has not been very compliant with his compression, his diet and his diuretics and as a result his lymphedema has increased markedly 10/25/2016 -- he has been compliant this week, has worn his 2 press compression wraps, taken his diuretics and is watching his salt intake. 11/08/2016 -- he had his venous procedure done last week and details of this have been reported and reviewed in his electronic medical record. he had a laser ablation of his great saphenous vein and this was done from mid calf to just below the saphenofemoral junction. He would return next week for ultrasound follow-up. He will then undergo the small saphenous vein ablation in a few weeks. 11/15/2016 -- his postprocedure visit showed good closure of his left great saphenous vein from the mid calf to 1-1/2 cm from the saphenofemoral junction. He had excellent early results from the ablation. The ablation of the left small saphenous vein was planned in a week 12/06/2016 -- he had his small saphenous vein venous ablation a week before and the duplex showed closure of his small saphenous vein to within half centimeter office saphenous popliteal junction with no DVT and he was pleased with the results. He recommended continue with elevation and compression and to see them back on an as-needed basis 12/19/16; the patient's wound is actually closed. Sometime over the weekend he developed a small abrasion on the lower calf just above the original wound on the left medial malleolus although this is closed as well Readmission: 01/18/18 on  evaluation today patient presents after having last been seen in our clinic July 2018. Subsequently he is a reoccurrence of the ulcers on the left ankle both medial and now a new area lateral that had been present back room for about a month he tells me. He states that this has done very well for about a year he really does not know of any injury or anything that happened that would have caused this reopening at this point. He has continued to wear compression stockings which is appropriate. He does not have lymphedema pumps. He has continued to also keep the area clean and dry as best he could. With that being said now that is been reopened is been harder for him to take care of this as far as compression stockings are concerned keeping them clean along with continuing to manage his fluid and swelling. His ABI appears to be great on the left registering at 1.14 today. He has previously undergone a venous ablation. Other than compression/lymphedema pumps he's really done everything that he can to help manage and prevent these ulcers from forming. 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 01/30/18 on evaluation today patient appears to actually be doing very well in regard to his medial ankle ulcer. It's the lateral ulcer that actually is not doing quite as well today. Fortunately he did have some alginate left ovary start using this on the wounds and the medial ankle actually seems to be doing much better. The lateral ankle does actually need something to con a help with slough and buildup. The collagen really did not seem to do much for him in that regard. 02/13/18 on evaluation today patient appears to be doing about the same in regard to his ulcers. Unfortunately I do not feel like were making good progress at this time. When I have debrided the wound the slough seems to come back fairly quickly in the lateral ankle ulcer. There does not appear to be any evidence  of infection at this time. No fevers, chills,  nausea, or vomiting noted at this time. 02/27/18 on evaluation today patient actually appears to be showing some signs of improvement in regard to both wound areas. I'm insurance on the medial aspect of his left lower extremity at the ankle whether or not the alginate may be sticking and causing some tearing off of new skin attempting to grow. With that being said the patient states it does not seem to be doing such just pulls up some of the dried dead skin around. Nonetheless this is something I wanted to watch out for going forward and actually I recommended that he take the dressing off in the shower when you could get it completely wet before removal to see how this does. Subsequently in regard to the lateral ankle ulcer he still has slough noted at this point although I do believe that he is tolerating the Medihoney much better I wish you could have gotten the Santyl but unfortunately the Santyl was too expensive gonna cost him $250. 03/06/18 on evaluation today patient appears to be doing about the same in regard to both ulcers of his left medial and lateral malleolus sites. He's been tolerating the dressing changes without complication. Unfortunately things do not seem to be improving in regard to either side at this time. Overall I think we may need to switch things up a little bit as far as the way we are performing the dressing changes currently. 03/27/18 on evaluation today patient's wound bed actually appears to be doing much better at both locations in regard to his ankle ulcers. He has been tolerating the dressing changes at this time without complication. Overall I'm very pleased with how things appear. The patient likewise states that he's much better in regard to discomfort at this time. 04/10/18 on evaluation today patient actually appears to be doing well in regard to his left lateral ankle ulcer. He has been tolerating the dressing changes without complication. Fortunately there does  not appear to be any evidence of infection. Overall I do feel like he is tolerating the Medihoney without complication. 04/24/18 upon evaluation today patient's wound actually appears to be healed on the lateral aspect of his ankle the medial aspect still continues to remain close without any evidence of complication at this time. Overall I have been extremely pleased with how things have progressed up to this point. The patient likewise is very happy he's not having any significant pain this time. He does wears compression stocking on a regular basis. Readmission: 10-19-2021 upon evaluation today patient presents for evaluation here in the clinic concerning issues with a left lateral ulcer as well as a right lateral ulcer both along the aspect of his foot getting close to the ankle. This left area is the same place that I treated back in 2024. 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 This appears to be very inflamed and is also very painful. In the past he has been completely unable to tolerate the use of any compression wraps unfortunately. There does not appear to be any evidence of active infection locally nor systemically at this time which is good news. I do believe however we need to try to see what we can do to calm down the inflammation I think triamcinolone topically as well as oral prednisone would likely be indicated in this case. Patient's medical history really has not changed significantly since I last  saw him in actually 2019 though I think is stated 2020 above. 10-26-21 upon evaluation today patient appears to still be having a lot of irritation and inflammation in regard to the ankles laterally. The left is greater than the right. With that being said I do believe that he would benefit from a biopsy to confirm whether or not this may indeed be vasculitis. It actually seems like that physically and on examination but again I want to be sure  that we are on the right track as far as treatment is concerned. The good news is he did not have a DVT I called him about that on Friday this was all in regard to the right leg. Overall I am very pleased in that regard. 11-02-2021 upon evaluation today patient appears to be doing poorly still in regard to his left lower extremity. Apparently he had a fever on Sunday which was around 101 and he was severely sick feeling and disoriented according to his wife. She came with him today because she did not think that he would actually tell me what was going on. With that being said unfortunately he did not go to the hospital he actually has been doing a little better over the past few days he been taking amoxicillin he has not taken any other medications orally at this point that are different. He has not been on any antibiotics either more recently other than the amoxicillin. Nonetheless he has not had a repeat in the past 3 days of that issue. 11-09-2021 upon evaluation today patient presents after having had quite an ordeal over the past several days. Subsequently yesterday I got a call from [place] here in [place] concerning the patient and the fact that he was having bleeding that been going on for the past 24 hours and was not stopping. Subsequently my advice was to have the patient go to the ER for further evaluation and treatment. It seems to me that he had a regular way part of the wound down to the point that a blood vessel and open. Apparently he was filling up trash bags wrapped around his foot with blood. In fact his lab review which I did do him as well today showed that he went from a white hemoglobin on 09-14-2021 of 13.6 down to a hemoglobin of 11.2 on 11-08-2021 this was yesterday. Subsequently this is due to the amount of blood loss that he has had acutely. Again I do not believe he is at transfusion stage but at the same time he is extremely weak and states that a couple times when he is going  to stand up he is actually fallen back into his chair. Nonetheless I do believe that he probably needs to supplement with iron and I did recommend that there are some over-the-counter products he could get in this regard. Subsequently also suggested that the patient needs to be drinking plenty of water he apparently drinks Coke and he drinks coffee but that is about it. Subsequently following this conversation his wife was present during this time as well and I do think that he is going to try to drink more I think it is of utmost importance. With that being said I did review his PCR culture as well it was positive for 2 organisms. This was Pseudomonas and Enterobacter. Both were showing up as being very prevalent in the PCR culture and subsequently Cipro will take care of the situation which is what I am recommending at this point  based on what we see. This will take the place of the Bactrim and can have him discontinue the Bactrim the only thing is he is getting need to stop taking the hydroxyzine if he has been taking it that is the Vistaril and he tells me that he only takes this when he itches he has not been taking it recently. 6/7; patient arrives in clinic today with the wound on the right foot actually looking some better the area on the left lateral looking about the same. Problematically, he is having edema fluid leakage on the medial part of the left foot and ankle causing skin breakdown but no open wound. He has been very resistant to the notion of compression wraps. He has been using Santyl on both wound areas at home changing the dressing himself Partway through our visit today it became clear he had had a fall on Sunday 3 days ago. Since then he has had a lot of problems with his right shoulder 11-23-2021 upon evaluation today patient appears to be doing well with regard to his wound. Has been tolerating dressing changes without complication. Fortunately there does not appear to be any  signs of infection he was wrapped last week when Dr. Dellia Nims saw him on the left. He was having some breakdown medially which they were concerned about. He did keep the wrap on until Monday but states it got extremely wet. For that reason I am thinking of doing a nurse visit on Friday so that we can get some of the swelling down right now by Friday we should have a lot of that out that we can switch out and put on a fresh dressing to get him through till next Wednesday. He is in agreement with giving that a try. 11-30-2021 upon evaluation today patient actually appears to be doing better with regard to the overall appearance of his wound. Fortunately there does not appear to be any signs of active infection locally or systemically which is great news. With that being said he has been tolerating the dressing changes without complication other than the fact that from Wednesday to Friday he did well with the compression wrap from Friday till yesterday he tells me the drainage was so bad and the smell was so bad he had to take this off. It does appear that he is very macerated around the lower portion down around the heel and I do think that this is an issue. Unfortunately we cannot get home health to help with this currently. Objective Constitutional Well-nourished and well-hydrated in no acute distress. Vitals Time Taken: 9:47 AM, Height: 66 in, Weight: 184 lbs, BMI: 29.7, Temperature: 98.6 F, Pulse: 76 bpm, Respiratory Rate: 18 breaths/min, Blood Pressure: 155/68 mmHg. Respiratory normal breathing without difficulty. Psychiatric this patient is able to make decisions and demonstrates good insight into disease process. Alert and Oriented x 3. pleasant and cooperative. General Notes: Upon inspection patient's wound bed actually showed signs of necrotic debris. His infection seems to be significantly improved which is great news and I am very pleased in that regard. With that being said I do believe  that he is going to need continued and ongoing treatment here and I do believe that he would benefit from the use of the Santyl daily to that end and considering amount of drainage that he has not been having changed this daily with Santyl and looking to have him use an Ace wrap to secure and to help with compression. Integumentary (Hair, Skin)  Wound #6 status is Open. Original cause of wound was Gradually Appeared. The date acquired was: 07/25/2021. The wound has been in treatment 6 weeks. The wound is located on the Left,Lateral Foot. The wound measures 6.2cm length x 5.8cm width x 0.2cm depth; 28.243cm^2 area and 5.649cm^3 volume. There is Fat Layer (Subcutaneous Tissue) exposed. There is no tunneling or undermining noted. There is a large amount of purulent drainage noted. The wound margin is distinct with the outline attached to the wound base. There is medium (34-66%) red, pink granulation within the wound bed. There is a medium (34-66%) amount of necrotic tissue within the wound bed including Adherent Slough. Wound #7 status is Healed - Epithelialized. Original cause of wound was Gradually Appeared. The date acquired was: 06/20/2021. The wound has been in treatment 6 weeks. The wound is located on the Right,Lateral Foot. The wound measures 0cm length x 0cm width x 0cm depth; 0cm^2 area and 0cm^3 volume. 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 There was a Chemical/Enzymatic/Mechanical debridement performed by [name] Keeler, PA.Marland Kitchen Agent used was [employer]. A time out was conducted at 10:24, prior to the start of the procedure. There was no bleeding. The procedure was tolerated well. Post Debridement Measurements: 6.2cm length x 5.8cm width x 0.2cm depth; 5.649cm^3 volume. Character of Wound/Ulcer Post Debridement is stable. Post procedure Diagnosis Wound #6: Same as Pre-Procedure Plan Follow-up Appointments: Return Appointment in 1 week. - 12/07/21 @ 8:00am and 12/14/21 @ 9:30am with Bernette Redbird, Room 7) Bathing/ Shower/ Hygiene: May shower and wash wound with soap and water. - wash with antibacterial soap when changing dressing Edema Control - Lymphedema / SCD / Other: Elevate legs to the level of the heart or above for 30 minutes daily and/or when sitting, a frequency of: - throughout the day Patient to wear own compression stockings every day. - Use stocking daily to right leg Moisturize legs daily. - Eucerin (in jar) Additional Orders / Instructions: Follow Nutritious Diet Non Wound Condition: Apply the following to affected area as directed: - Apply Hydrofera Blue to left medial. WOUND #6: - Foot Wound Laterality: Left, Lateral Cleanser: Soap and Water 1 x Per Day/30 Days Discharge Instructions: May shower and wash wound with dial antibacterial soap and water prior to dressing change. Peri-Wound Care: Zinc Oxide Ointment 30g tube 1 x Per Day/30 Days Discharge Instructions: Apply Zinc Oxide to periwound with each dressing change Prim Dressing: Santyl Ointment 1 x Per Day/30 Days ary Discharge Instructions: Apply nickel thick amount to wound bed as instructed Secondary Dressing: ABD Pad, 5x9 1 x Per Day/30 Days Discharge Instructions: Apply over primary dressing as directed. Secondary Dressing: Woven Gauze Sponge, Non-Sterile 4x4 in 1 x Per Day/30 Days Discharge Instructions: Apply over primary dressing as directed. Secured With: Elastic Bandage 4 inch (ACE bandage) 1 x Per Day/30 Days Discharge  Instructions: Secure with ACE bandage as directed. Secured With: [employer], 4.5x3.1 (in/yd) 1 x Per Day/30 Days Discharge Instructions: Secure with Kerlix as directed. Secured With: 18M Medipore Public affairs consultant Surgical T 2x10 (in/yd) 1 x Per Day/30 Days  ape Discharge Instructions: Secure with tape as directed. Compression Wrap: Elastic 4" Bandage (ACE) 1 x Per Day/30 Days Discharge Instructions: Apply from base of toes to just below knee 1. I would recommend currently that we hold the compression wrapping for the time being I [name] Fernandez have the patient go ahead and utilize an Ace wrap that he needs to make sure goes from the base of the toes all the way up to just below his knee. He will be changing this daily applying the Santyl to the left lateral ankle region. 2. T the medial ankle on the left we can be using a silver alginate dressing to keep this dry. o 3. I am going to suggest that the patient continue to monitor for any signs of worsening or infection if anything changes he should contact the office and let us know. We will see patient back for reevaluation in 1 week here in the clinic. If anything worsens or changes patient will contact our office for additional recommendations. Electronic Signature(s) Signed: 11/30/2021 12:54:42 PM By: [name] Keeler PA-Fernandez Entered By: [name] Fernandez on 11/30/2021 12:54:41 -------------------------------------------------------------------------------- SuperBill Details Patient Name: Date of Service: [name] Fernandez, [name] Fernandez. 11/30/2021 Medical Record Number: [id number] Patient Account Number: [id number] Date of Birth/Sex: Treating RN: [id number] (81 y.o. 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 L97.522 Non-pressure chronic ulcer of other part of left foot with fat layer exposed L97.512 Non-pressure chronic ulcer of other part of right foot with fat layer exposed E11.622 Type 2 diabetes mellitus with other skin ulcer Facility Procedures CPT4 Code: [id number] Description: [phone] - DEBRIDE W/O ANES NON SELECT ICD-10 Diagnosis Description L97.522 Non-pressure chronic ulcer of other part of left foot with fat layer exposed Modifier: Quantity: 1 Physician Procedures : CPT4 Code Description Modifier [id number] - WC PHYS LEVEL 4 - EST PT ICD-10 Diagnosis Description I87.333 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 extremity I89.0 Lymphedema, not elsewhere classified  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

## 2021-12-02 ENCOUNTER — Ambulatory Visit (HOSPITAL_BASED_OUTPATIENT_CLINIC_OR_DEPARTMENT_OTHER): Payer: Medicare HMO | Admitting: Internal Medicine

## 2021-12-07 ENCOUNTER — Encounter (HOSPITAL_BASED_OUTPATIENT_CLINIC_OR_DEPARTMENT_OTHER): Payer: Medicare HMO | Admitting: Physician Assistant

## 2021-12-07 DIAGNOSIS — L958 Other vasculitis limited to the skin: Secondary | ICD-10-CM | POA: Diagnosis not present

## 2021-12-07 DIAGNOSIS — I890 Lymphedema, not elsewhere classified: Secondary | ICD-10-CM | POA: Diagnosis not present

## 2021-12-07 DIAGNOSIS — E11622 Type 2 diabetes mellitus with other skin ulcer: Secondary | ICD-10-CM | POA: Diagnosis not present

## 2021-12-07 DIAGNOSIS — Z87891 Personal history of nicotine dependence: Secondary | ICD-10-CM | POA: Diagnosis not present

## 2021-12-07 DIAGNOSIS — L97512 Non-pressure chronic ulcer of other part of right foot with fat layer exposed: Secondary | ICD-10-CM | POA: Diagnosis not present

## 2021-12-07 DIAGNOSIS — I87333 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 extremity: Secondary | ICD-10-CM | POA: Diagnosis not present

## 2021-12-07 DIAGNOSIS — L97522 Non-pressure chronic ulcer of other part of left foot with fat layer exposed: Secondary | ICD-10-CM | POA: Diagnosis not present

## 2021-12-07 NOTE — Progress Notes (Signed)
SHYLO, DILLENBECK ([id number]) Visit Report for 12/07/2021 Chief Complaint Document Details Patient Name: Date of Service: [place], [name] 12/07/2021 8:00 A M Medical Record Number: [id number] Patient Account Number: [id number] Date of Birth/Sex: Treating RN: Oct 25, 1940 (81 y.o. [name] Primary Care Provider: [name] Other Clinician: Referring Provider: Treating Provider/Extender: Azucena Kuba in Treatment: 7 Information Obtained from: Patient Chief Complaint Bilateral foot ulcers Electronic Signature(s) Signed: 12/07/2021 8:20:37 AM By: [name] PA-C Entered By: [name] on 12/07/2021 08:20:37 -------------------------------------------------------------------------------- Debridement Details Patient Name: Date of Service: [place], [name] 12/07/2021 8:00 A M Medical Record Number: [id number] Patient Account Number: [id number] Date of Birth/Sex: Treating RN: 01/22/1941 (81 y.o. [name] Primary Care Provider: [name] Other Clinician: Referring Provider: Treating Provider/Extender: Azucena Kuba in Treatment: 7 Debridement Performed for Assessment: Wound #6 Left,Lateral Foot Performed By: Physician [name], PA Debridement Type: Debridement Level of Consciousness (Pre-procedure): Awake and Alert Pre-procedure Verification/Time Out Yes - 08:19 Taken: Start Time: 08:20 Pain Control: Lidocaine 4% T opical Solution T Area Debrided (L x W): otal 6 (cm) x 5.2 (cm) = 31.2 (cm) Tissue and other material debrided: Non-Viable, Slough, Subcutaneous, Biofilm, Slough Level: Skin/Subcutaneous Tissue Debridement Description: Excisional Instrument: Curette Bleeding: Minimum Hemostasis Achieved: Pressure End Time: 08:24 Response to Treatment: Procedure was tolerated well Level of Consciousness (Post- Awake and Alert procedure): Post Debridement Measurements of Total  Wound Length: (cm) 6 Width: (cm) 5.2 Depth: (cm) 0.2 Volume: (cm) 4.901 Character of Wound/Ulcer Post Debridement: Stable Post Procedure Diagnosis Same as Pre-procedure Electronic Signature(s) Unsigned Entered By: [name] on 12/07/2021 08:24:16 -------------------------------------------------------------------------------- Physician Orders Details Patient Name: Date of Service: [place], [name] 12/07/2021 8:00 A M Medical Record Number: [id number] Patient Account Number: [id number] Date of Birth/Sex: Treating RN: 07/26/1940 (81 y.o. 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 - 12/14/21 and 12/21/21 @ 9:30am with Bernette Redbird, Room 7) Return A Bathing/ Shower/ Hygiene May shower and wash wound with soap and water. - wash with antibacterial soap when changing dressing Edema Control - Lymphedema / SCD / Other Elevate legs to the level of the heart or above for 30 minutes daily and/or when sitting, a frequency of: - throughout the day Patient to wear own compression stockings every day. - Use stocking daily to right leg Moisturize legs daily. - Eucerin (in jar) Additional Orders / Instructions Follow Nutritious Diet Non Wound Condition pply the following to affected area as directed: - Apply Hydrofera Blue  to left medial. A Wound Treatment Wound #6 - Foot Wound Laterality: Left, Lateral Cleanser: Soap and Water 1 x Per Day/30 Days Discharge Instructions: May shower and wash wound with dial antibacterial soap and water prior to dressing change. Peri-Wound Care: Zinc Oxide Ointment 30g tube 1 x Per Day/30 Days Discharge Instructions: Apply Zinc Oxide to periwound with  each dressing change Prim Dressing: Santyl Ointment 1 x Per Day/30 Days ary Discharge Instructions: Apply nickel thick amount to wound bed as instructed Secondary Dressing: ABD Pad, 5x9 1 x Per Day/30 Days Discharge Instructions: Apply over primary dressing as directed. Secondary Dressing: Woven Gauze Sponge, Non-Sterile 4x4 in 1 x Per Day/30 Days Discharge Instructions: Apply over primary dressing as directed. Secured With: Elastic Bandage 4 inch (ACE bandage) 1 x Per Day/30 Days Discharge Instructions: Secure with ACE bandage as directed. Secured With: [employer], 4.5x3.1 (in/yd) 1 x Per Day/30 Days Discharge Instructions: Secure with Kerlix as directed. Secured With: 12M Medipore Public affairs consultant Surgical T 2x10 (in/yd) 1 x Per Day/30 Days ape Discharge Instructions: Secure with tape as directed. Compression Wrap: Elastic 4" Bandage (ACE) 1 x Per Day/30 Days Discharge Instructions: Apply from base of toes to just below knee Electronic Signature(s) Unsigned Entered By: [name] on 12/07/2021 08:29:03 -------------------------------------------------------------------------------- Problem List Details Patient Name: Date of Service: [place], [place] LD C. 12/07/2021 8:00 A M Medical Record Number: [id number] Patient Account Number: [id number] Date of Birth/Sex: Treating RN: 1941/04/29 (81 y.o. 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 10/19/2021 No Yes L97.522 Non-pressure chronic ulcer of other part of left foot with fat layer exposed 10/19/2021 No Yes L97.512 Non-pressure chronic ulcer of other part of right foot with fat layer exposed 10/19/2021 No Yes E11.622 Type 2 diabetes mellitus with other skin ulcer 10/19/2021 No Yes Inactive Problems Resolved Problems Electronic Signature(s) Signed: 12/07/2021 8:20:22 AM By: [name] PA-C Entered By: [name] on 12/07/2021 08:20:22 -------------------------------------------------------------------------------- SuperBill Details Patient Name: Date of Service: [place], [name] 12/07/2021 Medical Record Number: [id number] Patient Account Number: [id number] Date of Birth/Sex: Treating RN: [birth year] (81 y.o. 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 layer exposed E11.622 Type 2 diabetes mellitus with other skin ulcer Facility Procedures CPT4 Code: [id number] Description: 56387 - DEB SUBQ TISSUE 20 SQ CM/< ICD-10 Diagnosis Description L97.522 Non-pressure chronic ulcer of other part of left foot with fat layer exposed Modifier: Quantity: 1 CPT4 Code: [id number] Description:  18841 - DEB SUBQ TISS EA ADDL 20CM ICD-10 Diagnosis Description L97.522 Non-pressure chronic ulcer of other part of left foot with fat layer exposed Modifier: Quantity: 1 Physician Procedures : CPT4 Code Description Modifier [id number] 60109 - WC PHYS SUBQ TISS 20 SQ CM ICD-10 Diagnosis Description L97.522 Non-pressure chronic ulcer of other part of left foot with fat layer exposed Quantity: 1 : [id number] 22025 - WC PHYS SUBQ TISS EA ADDL 20 CM ICD-10 Diagnosis Description L97.522 Non-pressure chronic ulcer of other part of left foot with fat layer exposed Quantity: 1 Electronic Signature(s) Unsigned Entered By: [name] on 12/07/2021 08:29:42 Signature(s): Date(s):

## 2021-12-14 ENCOUNTER — Encounter (HOSPITAL_BASED_OUTPATIENT_CLINIC_OR_DEPARTMENT_OTHER): Payer: Medicare HMO | Attending: General Surgery | Admitting: General Surgery

## 2021-12-14 DIAGNOSIS — L97512 Non-pressure chronic ulcer of other part of right foot with fat layer exposed: Secondary | ICD-10-CM | POA: Diagnosis not present

## 2021-12-14 DIAGNOSIS — L97522 Non-pressure chronic ulcer of other part of left foot with fat layer exposed: Secondary | ICD-10-CM | POA: Diagnosis not present

## 2021-12-14 DIAGNOSIS — I87333 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 extremity: Secondary | ICD-10-CM | POA: Insufficient documentation

## 2021-12-14 DIAGNOSIS — E11622 Type 2 diabetes mellitus with other skin ulcer: Secondary | ICD-10-CM | POA: Diagnosis not present

## 2021-12-14 DIAGNOSIS — I890 Lymphedema, not elsewhere classified: Secondary | ICD-10-CM | POA: Insufficient documentation

## 2021-12-14 DIAGNOSIS — L958 Other vasculitis limited to the skin: Secondary | ICD-10-CM | POA: Diagnosis not present

## 2021-12-14 NOTE — Progress Notes (Addendum)
JUMAR, [address] ([id number]) Visit Report for 12/14/2021 Chief Complaint Document Details Patient Name: Date of Service: [place], [name] 12/14/2021 9:30 A M Medical Record Number: [id number] Patient Account Number: [id number] Date of Birth/Sex: Treating RN: Nov 05, 1940 (81 y.o. [name] Fernandez Primary Care Provider: [name] Other Clinician: Referring Provider: Treating Provider/Extender: Azucena Kuba in Treatment: 8 Information Obtained from: Patient Chief Complaint Bilateral foot ulcers Electronic Signature(s) Signed: 12/14/2021 10:06:01 AM By: [name] PA-C Entered By: [name] on 12/14/2021 10:06:01 -------------------------------------------------------------------------------- Debridement Details Patient Name: Date of Service: [place], [name] 12/14/2021 9:30 A M Medical Record Number: [id number] Patient Account Number: [id number] Date of Birth/Sex: Treating RN: [birth year] (81 y.o. [name] Fernandez Primary Care Provider: [name] Other Clinician: Referring Provider: Treating Provider/Extender: Azucena Kuba in Treatment: 8 Debridement Performed for Assessment: Wound #6 Left,Lateral Foot Performed By: Physician [name], PA Debridement Type: Debridement Level of Consciousness (Pre-procedure): Awake and Alert Pre-procedure Verification/Time Out Yes - 10:34 Taken: Start Time: 10:35 Pain Control: Lidocaine 5% topical ointment T Area Debrided (L x W): otal 6 (cm) x 5.1 (cm) = 30.6 (cm) Tissue and other material debrided: Non-Viable, Slough, Subcutaneous, Slough Level: Skin/Subcutaneous Tissue Debridement Description: Excisional Instrument: Curette Bleeding: Minimum Hemostasis Achieved: Pressure End Time: 10:39 Response to Treatment: Procedure was tolerated well Level of Consciousness (Post- Awake and Alert procedure): Post Debridement Measurements of Total Wound Length:  (cm) 6 Width: (cm) 5.1 Depth: (cm) 0.2 Volume: (cm) 4.807 Character of Wound/Ulcer Post Debridement: Stable Post Procedure Diagnosis Same as Pre-procedure Electronic Signature(s) Signed: 12/14/2021 4:22:37 PM By: [name] PA-C Signed: 12/14/2021 5:13:46 PM By: [name] Entered By: [name] on 12/14/2021 10:39:46 -------------------------------------------------------------------------------- HPI Details Patient Name: Date of Service: [name], [name] 12/14/2021 9:30 A M Medical Record Number: [id number] Patient Account Number: [id number] Date of Birth/Sex: Treating RN: 1941-03-09 (81 y.o. [name] Fernandez Primary Care Provider: [name] Other Clinician: Referring Provider: Treating Provider/Extender: Azucena Kuba in Treatment: 8 History of Present Illness HPI Description: 81 year old gentleman known to have swelling of his left lower extremity with some ulceration along the medial ankle has been having these problems for the last 3-4 months and does not know how it came on. No history of injury or no history of any infection in this area. He is known to have diabetes mellitus controlled with diet, hyperlipidemia, hypertension and chronic lumbar back pain with sciatica which is being treated by his PCP. Last hemoglobin A1c was 6.3 in June 2017. Last medical history is significant for esophageal stricture, hiatal hernia, vitamin D deficiency, status post back surgery 3, hernia repair as a child for both groins, hiatal hernia repair, joint replacement and revision of a knee surgery on the right side. He has quit smoking in 1982. He has never had a Doppler study of his lower extremity except remotely when he had knee surgery they looked for a blood clot on the right side. 09/13/2016 -- venous reflux study done on 09/12/2016 showed there is evidence of greater saphenous vein reflux in the left lower extremity and the small saphenous vein  and the posterior thigh is not competent. 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 With these results he definitely needs a referral to the vascular surgeons 09/20/2016 -- he has an appointment to see the vascular surgeons on May 1 10/03/2016 --  patient had a 4 layer compression on his left lower extremity and had a lot of discomfort and pain. 10/11/2016-- was seen by Dr. Althea [name] -- review of his data he recommended staged laser ablation of his great and then small saphenous veins for reduction odd of his venous hypertension. He did examine his right leg with the SonoSite and he has dilatation of the saphenous vein on the right lower extremity too. Since there was no evidence of ulceration he recommended observation of the right lower extremity. He recommended to continue with local wound care at the wound center until his wound was completely healed. 10/18/2016 -- the patient has not been very compliant with his compression, his diet and his diuretics and as a result his lymphedema has increased markedly 10/25/2016 -- he has been compliant this week, has worn his 2 press compression wraps, taken his diuretics and is watching his salt intake. 11/08/2016 -- he had his venous procedure done last week and details of this have been reported and reviewed in his electronic medical record. he had a laser ablation of his great saphenous vein and this was done from mid calf to just below the saphenofemoral junction. He would return next week for ultrasound follow-up. He will then undergo the small saphenous vein ablation in a few weeks. 11/15/2016 -- his postprocedure visit showed good closure of his left great saphenous vein from the mid calf to 1-1/2 cm from the saphenofemoral junction. He had excellent early results from the ablation. The ablation of the left small saphenous vein was planned in a week 12/06/2016 -- he had his small saphenous vein venous ablation a week before and the  duplex showed closure of his small saphenous vein to within half centimeter office saphenous popliteal junction with no DVT and he was pleased with the results. He recommended continue with elevation and compression and to see them back on an as-needed basis 12/19/16; the patient's wound is actually closed. Sometime over the weekend he developed a small abrasion on the lower calf just above the original wound on the left medial malleolus although this is closed as well Readmission: 01/18/18 on evaluation today patient presents after having last been seen in our clinic July 2018. Subsequently he is a reoccurrence of the ulcers on the left ankle both medial and now a new area lateral that had been present back room for about a month he tells me. He states that this has done very well for about a year he really does not know of any injury or anything that happened that would have caused this reopening at this point. He has continued to wear compression stockings which is appropriate. He does not have lymphedema pumps. He has continued to also keep the area clean and dry as best he could. With that being said now that is been reopened is been harder for him to take care of this as far as compression stockings are concerned keeping them clean along with continuing to manage his fluid and swelling. His ABI appears to be great on the left registering at 1.14 today. He has previously undergone a venous ablation. Other than compression/lymphedema pumps he's really done everything that he can to help manage and prevent these ulcers from forming. 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 01/30/18 on evaluation today patient appears to actually be doing very well in regard to his medial ankle ulcer. It's the lateral ulcer that actually is not doing quite as well today. Fortunately he did have some  alginate left ovary start using this on the wounds and the medial ankle actually seems to be doing much better. The lateral ankle  does actually need something to con a help with slough and buildup. The collagen really did not seem to do much for him in that regard. 02/13/18 on evaluation today patient appears to be doing about the same in regard to his ulcers. Unfortunately I do not feel like were making good progress at this time. When I have debrided the wound the slough seems to come back fairly quickly in the lateral ankle ulcer. There does not appear to be any evidence of infection at this time. No fevers, chills, nausea, or vomiting noted at this time. 02/27/18 on evaluation today patient actually appears to be showing some signs of improvement in regard to both wound areas. I'm insurance on the medial aspect of his left lower extremity at the ankle whether or not the alginate may be sticking and causing some tearing off of new skin attempting to grow. With that being said the patient states it does not seem to be doing such just pulls up some of the dried dead skin around. Nonetheless this is something I wanted to watch out for going forward and actually I recommended that he take the dressing off in the shower when you could get it completely wet before removal to see how this does. Subsequently in regard to the lateral ankle ulcer he still has slough noted at this point although I do believe that he is tolerating the Medihoney much better I wish you could have gotten the Santyl but unfortunately the Santyl was too expensive gonna cost him $250. 03/06/18 on evaluation today patient appears to be doing about the same in regard to both ulcers of his left medial and lateral malleolus sites. He's been tolerating the dressing changes without complication. Unfortunately things do not seem to be improving in regard to either side at this time. Overall I think we may need to switch things up a little bit as far as the way we are performing the dressing changes currently. 03/27/18 on evaluation today patient's wound bed actually  appears to be doing much better at both locations in regard to his ankle ulcers. He has been tolerating the dressing changes at this time without complication. Overall I'm very pleased with how things appear. The patient likewise states that he's much better in regard to discomfort at this time. 04/10/18 on evaluation today patient actually appears to be doing well in regard to his left lateral ankle ulcer. He has been tolerating the dressing changes without complication. Fortunately there does not appear to be any evidence of infection. Overall I do feel like he is tolerating the Medihoney without complication. 04/24/18 upon evaluation today patient's wound actually appears to be healed on the lateral aspect of his ankle the medial aspect still continues to remain close without any evidence of complication at this time. Overall I have been extremely pleased with how things have progressed up to this point. The patient likewise is very happy he's not having any significant pain this time. He does wears compression stocking on a regular basis. Readmission: 10-19-2021 upon evaluation today patient presents for evaluation here in the clinic concerning issues with a left lateral ulcer as well as a right lateral ulcer both along the aspect of his foot getting close to the ankle. This left area is the same place that I treated back in 2024. This does appear to be more  of a vasculitis type situation based on what I am seeing. This appears to be very inflamed and is also very painful. In the past he has been completely unable to tolerate the use of any compression wraps unfortunately. There does not appear to be any evidence of active infection locally nor systemically at this time which is good news. I do believe however we need to try to see what we can do to calm down the inflammation I think triamcinolone topically as well as oral prednisone would likely be indicated in this case. Patient's medical  history really has not changed significantly since I last saw him in actually 2019 though I think is stated 2020 above. 10-26-21 upon evaluation today patient appears to still be having a lot of irritation and inflammation in regard to the ankles laterally. The left is greater than the right. With that being said I do believe that he would benefit from a biopsy to confirm whether or not this may indeed be vasculitis. It actually seems like that physically and on examination but again I want to be sure that we are on the right track as far as treatment is concerned. The good news is he did not have a DVT I called him about that on Friday this was all in regard to the right leg. Overall I am very pleased in that regard. 11-02-2021 upon evaluation today patient appears to be doing poorly still in regard to his left lower extremity. Apparently he had a fever on Sunday which was around 101 and he was severely sick feeling and disoriented according to his wife. She came with him today because she did not think that he would actually tell me what was going on. With that being said unfortunately he did not go to the hospital he actually has been doing a little better over the past few days he been taking amoxicillin he has not taken any other medications orally at this point that are different. He has not been on any antibiotics either more recently other than the amoxicillin. Nonetheless he has not had a repeat in the past 3 days of that issue. 11-09-2021 upon evaluation today patient presents after having had quite an ordeal over the past several days. Subsequently yesterday I got a call from [facility] here in [place] concerning the patient and the fact that he was having bleeding that been going on for the past 24 hours and was not stopping. Subsequently my advice was to have the patient go to the ER for further evaluation and treatment. It seems to me that he had a regular way part of the wound down to the point  that a blood vessel and open. Apparently he was filling up trash bags wrapped around his foot with blood. In fact his lab review which I did do him as well today showed that he went from a white hemoglobin on 09-14-2021 of 13.6 down to a hemoglobin of 11.2 on 11-08-2021 this was yesterday. Subsequently this is due to the amount of blood loss that he has had acutely. Again I do not believe he is at transfusion stage but at the same time he is extremely weak and states that a couple times when he is going to stand up he is actually fallen back into his chair. Nonetheless I do believe that he probably needs to supplement with iron and I did recommend that there are some over-the-counter products he could get in this regard. Subsequently also suggested that the patient  needs to be drinking plenty of water he apparently drinks Coke and he drinks coffee but that is about it. Subsequently following this conversation his wife was present during this time as well and I do think that he is going to try to drink more I think it is of utmost importance. With that being said I did review his PCR culture as well it was positive for 2 organisms. This was Pseudomonas and Enterobacter. Both were showing up as being very prevalent in the PCR culture and subsequently Cipro will take care of the situation which is what I am recommending at this point based on what we see. This will take the place of the Bactrim and can have him discontinue the Bactrim the only thing is he is getting need to stop taking the hydroxyzine if he has been taking it that is the Vistaril and he tells me that he only takes this when he itches he has not been taking it recently. 6/7; patient arrives in clinic today with the wound on the right foot actually looking some better the area on the left lateral looking about the same. Problematically, he is having edema fluid leakage on the medial part of the left foot and ankle causing skin breakdown but no  open wound. He has been very resistant to the notion of compression wraps. He has been using Santyl on both wound areas at home changing the dressing himself Partway through our visit today it became clear he had had a fall on Sunday 3 days ago. Since then he has had a lot of problems with his right shoulder 11-23-2021 upon evaluation today patient appears to be doing well with regard to his wound. Has been tolerating dressing changes without complication. Fortunately there does not appear to be any signs of infection he was wrapped last week when Dr. Dellia Nims saw him on the left. He was having some breakdown medially which they were concerned about. He did keep the wrap on until Monday but states it got extremely wet. For that reason I am thinking of doing a nurse visit on Friday so that we can get some of the swelling down right now by Friday we should have a lot of that out that we can switch out and put on a fresh dressing to get him through till next Wednesday. He is in agreement with giving that a try. 11-30-2021 upon evaluation today patient actually appears to be doing better with regard to the overall appearance of his wound. Fortunately there does not appear to be any signs of active infection locally or systemically which is great news. With that being said he has been tolerating the dressing changes without complication other than the fact that from Wednesday to Friday he did well with the compression wrap from Friday till yesterday he tells me the drainage was so bad and the smell was so bad he had to take this off. It does appear that he is very macerated around the lower portion down around the heel and I do think that this is an issue. Unfortunately we cannot get home health to help with this currently. 12-07-2021 upon evaluation today patient appears to be doing well currently in regard to his wound. He is actually showing signs of excellent improvement which is great news. I am very  pleased with where things stand. I do think that the progress is being made. With that being said the patient is tolerating the Santyl quite well and I think is  doing an awesome job for him. 12-14-2021 upon evaluation today patient appears to be doing well with regard to his wounds he is making progress is still draining a lot however which I completely understand. With that being said fortunately there does not appear to be any signs of active infection locally or systemically at this time. Electronic Signature(s) Signed: 12/14/2021 11:23:05 AM By: [name] PA-C Entered By: [name] on 12/14/2021 11:23:04 -------------------------------------------------------------------------------- Physical Exam Details Patient Name: Date of Service: [place], [name] 12/14/2021 9:30 A M Medical Record Number: [id number] Patient Account Number: [id number] Date of Birth/Sex: Treating RN: [birth year] (81 y.o. [name] Fernandez Primary Care Provider: [name] Other Clinician: Referring Provider: Treating Provider/Extender: Azucena Kuba in Treatment: 8 Constitutional Well-nourished and well-hydrated in no acute distress. Respiratory normal breathing without difficulty. Psychiatric this patient is able to make decisions and demonstrates good insight into disease process. Alert and Oriented x 3. pleasant and cooperative. Notes Upon inspection patient's wound bed actually showed signs of good granulation and epithelization at this point. Fortunately I do not see any evidence of active infection at this point which is great news and overall I am extremely pleased with where we stand currently. I did perform debridement of clearway some of the necrotic debris over the ankle region. Electronic Signature(s) Signed: 12/14/2021 11:23:31 AM By: [name] PA-C Entered By: [name] on 12/14/2021  11:23:31 -------------------------------------------------------------------------------- Physician Orders Details Patient Name: Date of Service: [place], [name] 12/14/2021 9:30 A M Medical Record Number: [id number] Patient Account Number: [id number] Date of Birth/Sex: Treating RN: 06-May-1941 (81 y.o. 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 - 12/21/21 @ 9:30am with Bernette Redbird, Room 7) Return A Bathing/ Shower/ Hygiene May shower and wash wound with soap and water. - wash with antibacterial soap when changing dressing Edema Control - Lymphedema / SCD / Other Elevate legs to the level of the heart or above for 30 minutes daily and/or when sitting, a frequency of: - throughout the day Patient to wear own compression stockings every day. - Use stocking daily to right leg Moisturize legs daily. - Eucerin (in jar) Additional Orders / Instructions Follow Nutritious Diet Non Wound Condition pply the following to affected area as directed: - Apply ABD pad to left medial leg A Wound Treatment Wound #6 - Foot Wound Laterality: Left, Lateral Cleanser: Soap and Water 1 x Per Day/30 Days Discharge Instructions: May shower and wash wound with dial antibacterial soap and water prior to dressing change. Peri-Wound Care: Zinc  Oxide Ointment 30g tube 1 x Per Day/30 Days Discharge Instructions: Apply Zinc Oxide to periwound with each dressing change Prim Dressing: Santyl Ointment 1 x Per Day/30 Days ary Discharge Instructions: Apply nickel thick amount to wound bed as instructed Secondary Dressing: ABD Pad, 5x9 1 x Per Day/30 Days Discharge Instructions: Apply over primary dressing as directed.  Secondary Dressing: Woven Gauze Sponge, Non-Sterile 4x4 in 1 x Per Day/30 Days Discharge Instructions: Apply over primary dressing as directed. Secondary Dressing: Zetuvit Plus 4x8 in 1 x Per Day/30 Days Discharge Instructions: Apply over primary dressing as directed. Secured With: Elastic Bandage 4 inch (ACE bandage) 1 x Per Day/30 Days Discharge Instructions: Secure with ACE bandage as directed. Secured With: [employer], 4.5x3.1 (in/yd) 1 x Per Day/30 Days Discharge Instructions: Secure with Kerlix as directed. Secured With: 60M Medipore Public affairs consultant Surgical T 2x10 (in/yd) 1 x Per Day/30 Days ape Discharge Instructions: Secure with tape as directed. Compression Wrap: Elastic 4" Bandage (ACE) 1 x Per Day/30 Days Discharge Instructions: Apply from base of toes to just below knee Electronic Signature(s) Signed: 12/14/2021 4:22:37 PM By: [name] PA-C Signed: 12/14/2021 5:13:46 PM By: [name] Entered By: [name] on 12/14/2021 10:44:50 -------------------------------------------------------------------------------- Problem List Details Patient Name: Date of Service: [place], [name] 12/14/2021 9:30 A M Medical Record Number: [id number] Patient Account Number: [id number] Date of Birth/Sex: Treating RN: 1940-07-17 (81 y.o. 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 10/19/2021 No Yes L97.522 Non-pressure chronic ulcer of other part of left foot with fat layer exposed 10/19/2021 No Yes L97.512 Non-pressure chronic ulcer of other part of right foot with fat layer exposed 10/19/2021 No Yes E11.622 Type 2 diabetes mellitus with other skin ulcer 10/19/2021 No Yes Inactive Problems Resolved Problems Electronic Signature(s) Signed: 12/14/2021 9:47:01 AM By: [name] PA-C Entered By: [name] on 12/14/2021 09:47:00 -------------------------------------------------------------------------------- Progress Note Details Patient Name: Date of Service: [place], [name] 12/14/2021 9:30 A M Medical Record Number: [id number] Patient Account Number: [id number] Date of Birth/Sex: Treating RN: 1941/05/16 (81 y.o. [name] Fernandez Primary Care Provider: [name] Other Clinician: Referring Provider: Treating Provider/Extender: Azucena Kuba in Treatment: 8 Subjective Chief Complaint Information obtained from Patient Bilateral foot ulcers History of Present Illness (HPI) 81 year old gentleman known to have swelling of his left lower extremity with some ulceration along the medial ankle has been having these problems for the last 3-4 months and does not know how it came on. No history of injury or no history of any infection in this area. He is known to have diabetes mellitus controlled with diet, hyperlipidemia, hypertension and chronic lumbar back pain with sciatica which is being treated by his PCP. Last hemoglobin A1c was 6.3 in June 2017. Last medical history is significant for esophageal stricture, hiatal hernia, vitamin D deficiency, status post back surgery o3, hernia repair as a child for both groins,  hiatal hernia repair, joint replacement and revision of a knee surgery on the right side. He has quit smoking in 1982. He has never had a Doppler study of his lower extremity except remotely when he had knee surgery they looked for a blood clot on the right side. 09/13/2016 -- venous reflux study done on 09/12/2016 showed there is evidence of greater saphenous vein reflux in the left lower extremity and the small saphenous vein and the posterior thigh is not competent. There is also deep venous reflux in the left lower  extremity. With these results he definitely needs a referral to the vascular surgeons 09/20/2016 -- he has an appointment to see the vascular surgeons on May 1 10/03/2016 -- patient had a 4 layer compression on his left lower extremity and had a lot of discomfort and pain. 10/11/2016-- was seen by Dr. Althea [name] -- review of his data he recommended staged laser ablation of his great and then small saphenous veins for reduction odd of his venous hypertension. He did examine his right leg with the SonoSite and he has dilatation of the saphenous vein on the right lower extremity too. Since there was no evidence of ulceration he recommended observation of the right lower extremity. He recommended to continue with local wound care at the wound center until his wound was completely healed. 10/18/2016 -- the patient has not been very compliant with his compression, his diet and his diuretics and as a result his lymphedema has increased markedly 10/25/2016 -- he has been compliant this week, has worn his 2 press compression wraps, taken his diuretics and is watching his salt intake. 11/08/2016 -- he had his venous procedure done last week and details of this have been reported and reviewed in his electronic medical record. he had a laser ablation of his great saphenous vein and this was done from mid calf to just below the saphenofemoral junction. He would return next week for  ultrasound follow-up. He will then undergo the small saphenous vein ablation in a few weeks. 11/15/2016 -- his postprocedure visit showed good closure of his left great saphenous vein from the mid calf to 1-1/2 cm from the saphenofemoral junction. He had excellent early results from the ablation. The ablation of the left small saphenous vein was planned in a week 12/06/2016 -- he had his small saphenous vein venous ablation a week before and the duplex showed closure of his small saphenous vein to within half centimeter office saphenous popliteal junction with no DVT and he was pleased with the results. He recommended continue with elevation and compression and to see them back on an as-needed basis 12/19/16; the patient's wound is actually closed. Sometime over the weekend he developed a small abrasion on the lower calf just above the original wound on the left medial malleolus although this is closed as well Readmission: 01/18/18 on evaluation today patient presents after having last been seen in our clinic July 2018. Subsequently he is a reoccurrence of the ulcers on the left ankle both medial and now a new area lateral that had been present back room for about a month he tells me. He states that this has done very well for about a year he really does not know of any injury or anything that happened that would have caused this reopening at this point. He has continued to wear compression stockings which is appropriate. He does not have lymphedema pumps. He has continued to also keep the area clean and dry as best he could. With that being said now that is been reopened is been harder for him to take care of this as far as compression stockings are concerned keeping them clean along with continuing to manage his fluid and swelling. His ABI appears to be great on the left registering at 1.14 today. He has previously undergone a venous ablation. Other than compression/lymphedema pumps he's really done  everything that he can to help manage and prevent these ulcers from forming. 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 01/30/18 on evaluation today patient appears to  actually be doing very well in regard to his medial ankle ulcer. It's the lateral ulcer that actually is not doing quite as well today. Fortunately he did have some alginate left ovary start using this on the wounds and the medial ankle actually seems to be doing much better. The lateral ankle does actually need something to con a help with slough and buildup. The collagen really did not seem to do much for him in that regard. 02/13/18 on evaluation today patient appears to be doing about the same in regard to his ulcers. Unfortunately I do not feel like were making good progress at this time. When I have debrided the wound the slough seems to come back fairly quickly in the lateral ankle ulcer. There does not appear to be any evidence of infection at this time. No fevers, chills, nausea, or vomiting noted at this time. 02/27/18 on evaluation today patient actually appears to be showing some signs of improvement in regard to both wound areas. I'm insurance on the medial aspect of his left lower extremity at the ankle whether or not the alginate may be sticking and causing some tearing off of new skin attempting to grow. With that being said the patient states it does not seem to be doing such just pulls up some of the dried dead skin around. Nonetheless this is something I wanted to watch out for going forward and actually I recommended that he take the dressing off in the shower when you could get it completely wet before removal to see how this does. Subsequently in regard to the lateral ankle ulcer he still has slough noted at this point although I do believe that he is tolerating the Medihoney much better I wish you could have gotten the Santyl but unfortunately the Santyl was too expensive gonna cost him $250. 03/06/18 on evaluation today patient  appears to be doing about the same in regard to both ulcers of his left medial and lateral malleolus sites. He's been tolerating the dressing changes without complication. Unfortunately things do not seem to be improving in regard to either side at this time. Overall I think we may need to switch things up a little bit as far as the way we are performing the dressing changes currently. 03/27/18 on evaluation today patient's wound bed actually appears to be doing much better at both locations in regard to his ankle ulcers. He has been tolerating the dressing changes at this time without complication. Overall I'm very pleased with how things appear. The patient likewise states that he's much better in regard to discomfort at this time. 04/10/18 on evaluation today patient actually appears to be doing well in regard to his left lateral ankle ulcer. He has been tolerating the dressing changes without complication. Fortunately there does not appear to be any evidence of infection. Overall I do feel like he is tolerating the Medihoney without complication. 04/24/18 upon evaluation today patient's wound actually appears to be healed on the lateral aspect of his ankle the medial aspect still continues to remain close without any evidence of complication at this time. Overall I have been extremely pleased with how things have progressed up to this point. The patient likewise is very happy he's not having any significant pain this time. He does wears compression stocking on a regular basis. Readmission: 10-19-2021 upon evaluation today patient presents for evaluation here in the clinic concerning issues with a left lateral ulcer as well as a right lateral ulcer both along  the aspect of his foot getting close to the ankle. This left area is the same place that I treated back in 2024. 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 This appears to be very inflamed and is also  very painful. In the past he has been completely unable to tolerate the use of any compression wraps unfortunately. There does not appear to be any evidence of active infection locally nor systemically at this time which is good news. I do believe however we need to try to see what we can do to calm down the inflammation I think triamcinolone topically as well as oral prednisone would likely be indicated in this case. Patient's medical history really has not changed significantly since I last saw him in actually 2019 though I think is stated 2020 above. 10-26-21 upon evaluation today patient appears to still be having a lot of irritation and inflammation in regard to the ankles laterally. The left is greater than the right. With that being said I do believe that he would benefit from a biopsy to confirm whether or not this may indeed be vasculitis. It actually seems like that physically and on examination but again I want to be sure that we are on the right track as far as treatment is concerned. The good news is he did not have a DVT I called him about that on Friday this was all in regard to the right leg. Overall I am very pleased in that regard. 11-02-2021 upon evaluation today patient appears to be doing poorly still in regard to his left lower extremity. Apparently he had a fever on Sunday which was around 101 and he was severely sick feeling and disoriented according to his wife. She came with him today because she did not think that he would actually tell me what was going on. With that being said unfortunately he did not go to the hospital he actually has been doing a little better over the past few days he been taking amoxicillin he has not taken any other medications orally at this point that are different. He has not been on any antibiotics either more recently other than the amoxicillin. Nonetheless he has not had a repeat in the past 3 days of that issue. 11-09-2021 upon evaluation today  patient presents after having had quite an ordeal over the past several days. Subsequently yesterday I got a call from [place] here in [place] concerning the patient and the fact that he was having bleeding that been going on for the past 24 hours and was not stopping. Subsequently my advice was to have the patient go to the ER for further evaluation and treatment. It seems to me that he had a regular way part of the wound down to the point that a blood vessel and open. Apparently he was filling up trash bags wrapped around his foot with blood. In fact his lab review which I did do him as well today showed that he went from a white hemoglobin on 09-14-2021 of 13.6 down to a hemoglobin of 11.2 on 11-08-2021 this was yesterday. Subsequently this is due to the amount of blood loss that he has had acutely. Again I do not believe he is at transfusion stage but at the same time he is extremely weak and states that a couple times when he is going to stand up he is actually fallen back into his chair. Nonetheless I do believe that  he probably needs to supplement with iron and I did recommend that there are some over-the-counter products he could get in this regard. Subsequently also suggested that the patient needs to be drinking plenty of water he apparently drinks Coke and he drinks coffee but that is about it. Subsequently following this conversation his wife was present during this time as well and I do think that he is going to try to drink more I think it is of utmost importance. With that being said I did review his PCR culture as well it was positive for 2 organisms. This was Pseudomonas and Enterobacter. Both were showing up as being very prevalent in the PCR culture and subsequently Cipro will take care of the situation which is what I am recommending at this point based on what we see. This will take the place of the Bactrim and can have him discontinue the Bactrim the only thing is he is getting need to  stop taking the hydroxyzine if he has been taking it that is the Vistaril and he tells me that he only takes this when he itches he has not been taking it recently. 6/7; patient arrives in clinic today with the wound on the right foot actually looking some better the area on the left lateral looking about the same. Problematically, he is having edema fluid leakage on the medial part of the left foot and ankle causing skin breakdown but no open wound. He has been very resistant to the notion of compression wraps. He has been using Santyl on both wound areas at home changing the dressing himself Partway through our visit today it became clear he had had a fall on Sunday 3 days ago. Since then he has had a lot of problems with his right shoulder 11-23-2021 upon evaluation today patient appears to be doing well with regard to his wound. Has been tolerating dressing changes without complication. Fortunately there does not appear to be any signs of infection he was wrapped last week when Dr. Dellia Nims saw him on the left. He was having some breakdown medially which they were concerned about. He did keep the wrap on until Monday but states it got extremely wet. For that reason I am thinking of doing a nurse visit on Friday so that we can get some of the swelling down right now by Friday we should have a lot of that out that we can switch out and put on a fresh dressing to get him through till next Wednesday. He is in agreement with giving that a try. 11-30-2021 upon evaluation today patient actually appears to be doing better with regard to the overall appearance of his wound. Fortunately there does not appear to be any signs of active infection locally or systemically which is great news. With that being said he has been tolerating the dressing changes without complication other than the fact that from Wednesday to Friday he did well with the compression wrap from Friday till yesterday he tells me the drainage  was so bad and the smell was so bad he had to take this off. It does appear that he is very macerated around the lower portion down around the heel and I do think that this is an issue. Unfortunately we cannot get home health to help with this currently. 12-07-2021 upon evaluation today patient appears to be doing well currently in regard to his wound. He is actually showing signs of excellent improvement which is great news. I am very pleased  with where things stand. I do think that the progress is being made. With that being said the patient is tolerating the Santyl quite well and I think is doing an awesome job for him. 12-14-2021 upon evaluation today patient appears to be doing well with regard to his wounds he is making progress is still draining a lot however which I completely understand. With that being said fortunately there does not appear to be any signs of active infection locally or systemically at this time. Objective Constitutional Well-nourished and well-hydrated in no acute distress. Vitals Time Taken: 10:05 AM, Height: 66 in, Weight: 184 lbs, BMI: 29.7, Temperature: 98.2 F, Pulse: 71 bpm, Respiratory Rate: 18 breaths/min, Blood Pressure: 152/89 mmHg. Respiratory normal breathing without difficulty. Psychiatric this patient is able to make decisions and demonstrates good insight into disease process. Alert and Oriented x 3. pleasant and cooperative. General Notes: Upon inspection patient's wound bed actually showed signs of good granulation and epithelization at this point. Fortunately I do not see any evidence of active infection at this point which is great news and overall I am extremely pleased with where we stand currently. I did perform debridement of clearway some of the necrotic debris over the ankle region. Integumentary (Hair, Skin) Wound #6 status is Open. Original cause of wound was Gradually Appeared. The date acquired was: 07/25/2021. The wound has been in treatment  8 weeks. The wound is located on the Left,Lateral Foot. The wound measures 6cm length x 5.1cm width x 0.2cm depth; 24.033cm^2 area and 4.807cm^3 volume. There is Fat Layer (Subcutaneous Tissue) exposed. There is no tunneling or undermining noted. There is a large amount of serosanguineous drainage noted. The wound margin is distinct with the outline attached to the wound base. There is medium (34-66%) red, pink granulation within the wound bed. There is a medium (34-66%) amount of necrotic tissue within the wound bed including Adherent Slough. 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 There was a Excisional Skin/Subcutaneous Tissue Debridement with a total area of 30.6 sq cm performed by [name], PA. With the following instrument(s): Curette to remove Non-Viable tissue/material. Material removed includes Subcutaneous Tissue and Slough and after achieving pain control using Lidocaine 5% topical ointment. No specimens were taken. A time out was conducted at 10:34, prior to the start of the procedure. A Minimum amount of bleeding was controlled with Pressure. The procedure was tolerated well. Post Debridement Measurements: 6cm length x 5.1cm width x 0.2cm depth; 4.807cm^3 volume. Character of Wound/Ulcer Post Debridement is stable. Post procedure Diagnosis Wound #6: Same as Pre-Procedure Plan Follow-up Appointments: Return Appointment in 1 week. - 12/21/21 @ 9:30am with Bernette Redbird, Room 7) Bathing/ Shower/ Hygiene: May  shower and wash wound with soap and water. - wash with antibacterial soap when changing dressing Edema Control - Lymphedema / SCD / Other: Elevate legs to the level of the heart or above for 30 minutes daily and/or when sitting, a frequency of: - throughout the day Patient to wear own compression stockings every day. - Use stocking daily to right leg Moisturize legs daily. -  Eucerin (in jar) Additional Orders / Instructions: Follow Nutritious Diet Non Wound Condition: Apply the following to affected area as directed: - Apply ABD pad to left medial leg WOUND #6: - Foot Wound Laterality: Left, Lateral Cleanser: Soap and Water 1 x Per Day/30 Days Discharge Instructions: May shower and wash wound with dial antibacterial soap and water prior to dressing change. Peri-Wound Care: Zinc Oxide Ointment 30g tube 1 x Per Day/30 Days Discharge Instructions: Apply Zinc Oxide to periwound with each dressing change Prim Dressing: Santyl Ointment 1 x Per Day/30 Days ary Discharge Instructions: Apply nickel thick amount to wound bed as instructed Secondary Dressing: ABD Pad, 5x9 1 x Per Day/30 Days Discharge Instructions: Apply over primary dressing as directed. Secondary Dressing: Woven Gauze Sponge, Non-Sterile 4x4 in 1 x Per Day/30 Days Discharge Instructions: Apply over primary dressing as directed. Secondary Dressing: Zetuvit Plus 4x8 in 1 x Per Day/30 Days Discharge Instructions: Apply over primary dressing as directed. Secured With: Elastic Bandage 4 inch (ACE bandage) 1 x Per Day/30 Days Discharge Instructions: Secure with ACE bandage as directed. Secured With: [employer], 4.5x3.1 (in/yd) 1 x Per Day/30 Days Discharge Instructions: Secure with Kerlix as directed. Secured With: 68M Medipore Public affairs consultant Surgical T 2x10 (in/yd) 1 x Per Day/30 Days ape Discharge Instructions: Secure with tape as directed. Com pression Wrap: Elastic 4" Bandage (ACE) 1 x Per Day/30 Days Discharge Instructions:  Apply from base of toes to just below knee 1. I would recommend that we going continue with the wound care measures as before and the patient is in agreement with plan this includes the use of the Santyl on the lateral aspect. 2. I am also can recommend that we have the patient continue to monitor for any signs of worsening or infection. Obviously if anything changes he should let me know. We will see patient back for reevaluation in 1 week here in the clinic. If anything worsens or changes patient will contact our office for additional recommendations. Electronic Signature(s) Signed: 12/14/2021 11:23:54 AM By: [name] PA-C Entered By: [name] on 12/14/2021 11:23:54 -------------------------------------------------------------------------------- SuperBill Details Patient Name: Date of Service: [place], [name] 12/14/2021 Medical Record Number: [id number] Patient Account Number: [id number] Date of Birth/Sex: Treating RN: [birth year] (81 y.o. 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 L97.522 Non-pressure chronic ulcer of other part of left foot with fat layer exposed L97.512 Non-pressure chronic ulcer of other part of right foot with fat layer exposed E11.622 Type 2 diabetes mellitus with other skin ulcer Facility Procedures CPT4 Code: [id number] 1 I Description: 6659 - DEB SUBQ TISSUE 20 SQ CM/< CD-10 Diagnosis Description L97.522 Non-pressure chronic ulcer of other part of left foot with fat layer exposed Modifier: 1 Quantity: CPT4 Code: 93570177 1 I Description: 9390 - DEB SUBQ TISS EA ADDL 20CM  CD-10 Diagnosis Description L97.522 Non-pressure chronic ulcer of other part of left foot with fat layer exposed Modifier: 1 Quantity: Physician Procedures : CPT4 Code Description Modifier [id number] 11042 - WC PHYS SUBQ TISS 20 SQ CM ICD-10 Diagnosis Description L97.522 Non-pressure chronic ulcer of other part of left foot with fat layer exposed Quantity: 1 : [id number] 11045 - WC PHYS SUBQ TISS  EA ADDL 20 CM ICD-10 Diagnosis Description L97.522 Non-pressure chronic ulcer of other part of left foot with fat layer exposed Quantity: 1 Electronic Signature(s) Signed: 12/14/2021 11:24:00 AM By: [name] PA-C Entered By: [name] on 12/14/2021 11:24:00

## 2021-12-15 DIAGNOSIS — L97522 Non-pressure chronic ulcer of other part of left foot with fat layer exposed: Secondary | ICD-10-CM | POA: Diagnosis not present

## 2021-12-15 DIAGNOSIS — I83025 Varicose veins of left lower extremity with ulcer other part of foot: Secondary | ICD-10-CM | POA: Diagnosis not present

## 2021-12-15 NOTE — Progress Notes (Signed)
TOREY, REINARD ([id number]) Visit Report for 12/14/2021 Arrival Information Details Patient Name: Date of Service: [place], [name] 12/14/2021 9:30 A M Medical Record Number: [id number] Patient Account Number: [id number] Date of Birth/Sex: Treating RN: 1941/01/03 (81 y.o. [name] Primary Care Isa Kohlenberg: [name] Other Clinician: Referring Katreena Schupp: Treating Rosellen Lichtenberger/Extender: Azucena Kuba in Treatment: 8 Visit Information History Since Last Visit Added or deleted any medications: No Patient Arrived: Ambulatory Any new allergies or adverse reactions: No Arrival Time: 10:02 Had a fall or experienced change in No Accompanied By: wife activities of daily living that may affect Transfer Assistance: None risk of falls: Patient Identification Verified: Yes Signs or symptoms of abuse/neglect since last visito No Secondary Verification Process Completed: Yes Hospitalized since last visit: No Patient Requires Transmission-Based Precautions: No Has Dressing in Place as Prescribed: Yes Patient Has Alerts: No Pain Present Now: No Electronic Signature(s) Signed: 12/15/2021 8:35:29 AM By: Erenest Blank Entered By: Erenest Blank on 12/14/2021 10:04:09 -------------------------------------------------------------------------------- Encounter Discharge Information Details Patient Name: Date of Service: [place], [name] 12/14/2021 9:30 A M Medical Record Number: [id number] Patient Account Number: [id number] Date of Birth/Sex: Treating RN: 1940/12/20 (81 y.o. [name] Primary Care Khayla Koppenhaver: [name] Other Clinician: Referring Latorya Bautch: Treating Zakira Ressel/Extender: Azucena Kuba in Treatment: 8 Encounter Discharge Information Items Post Procedure Vitals Discharge Condition: Stable Temperature (F): 98.2 Ambulatory Status: Ambulatory Pulse (bpm): 71 Discharge Destination: Home Respiratory Rate  (breaths/min): 18 Transportation: Private Auto Blood Pressure (mmHg): 152/89 Accompanied By: wife Schedule Follow-up Appointment: Yes Clinical Summary of Care: Provided on 12/14/2021 Form Type Recipient Paper Patient Patient Electronic Signature(s) Signed: 12/14/2021 11:27:26 AM By: [name] Entered By: [name] on 12/14/2021 11:27:26 -------------------------------------------------------------------------------- Lower Extremity Assessment Details Patient Name: Date of Service: [place], [name] 12/14/2021 9:30 A M Medical Record Number: [id number] Patient Account Number: [id number] Date of Birth/Sex: Treating RN: 06/24/1940 (81 y.o. [name] Primary Care Lynx [place]: [name] Other Clinician: Referring Ismahan Lippman: Treating Armando Lauman/Extender: Azucena Kuba in Treatment: 8 Edema Assessment Assessed: [Left: No] Patrice Paradise: No] Edema: [Left: Ye] [Right: s] Calf Left: Right: Point of Measurement: 32 cm From Medial Instep 40 cm Ankle Left: Right: Point of Measurement: 10 cm From Medial Instep 25.7 cm Vascular Assessment Pulses: Dorsalis Pedis Palpable: [Left:Yes] Electronic Signature(s) Signed: 12/14/2021 5:13:46 PM By: [name] Signed: 12/15/2021 8:35:29 AM By: Erenest Blank Entered By: Erenest Blank on 12/14/2021 10:18:33 -------------------------------------------------------------------------------- Multi Wound Chart Details Patient Name: Date of Service: [place], [place] [name] 12/14/2021 9:30 A M Medical Record Number: [id number] Patient Account Number: [id number] Date of Birth/Sex: Treating RN: [birth year] (81 y.o. [name] Primary Care Zarahi Fuerst: [name] Other Clinician: Referring Delma Villalva: Treating Daria Mcmeekin/Extender: Marcos Eke Weeks in Treatment: 8 Wound Assessments Treatment Notes Electronic Signature(s) Signed: 12/14/2021 9:47:06 AM By: [name] PA-C Signed:  12/14/2021 5:13:46 PM By: [name] Entered By: [name] on 12/14/2021 09:47:05 -------------------------------------------------------------------------------- Multi-Disciplinary Care Plan Details Patient Name: Date of Service: [place], [name] 12/14/2021 9:30 A M Medical Record Number: [id number] Patient Account Number: [id number] Date of Birth/Sex: Treating RN: 1941-06-09 (81 y.o. 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 Compression as ordered Notes: 11/16/21: Edema not controlled, patient not compliant with compression. 12/14/21:Edema not well controlled, patient will not allow compression wraps. Wound/Skin Impairment Nursing Diagnoses: Impaired tissue integrity Goals: Patient/caregiver will verbalize understanding of skin care regimen Date Initiated: 10/19/2021 Target Resolution Date: 02/08/2022 Goal Status: Active Ulcer/skin breakdown will have a volume reduction of 30% by week 4 Date Initiated: 10/19/2021 Date Inactivated: 12/14/2021 Target Resolution Date: 12/14/2021 Unmet Reason: infection, non Goal Status: Unmet compliance Interventions: Assess patient/caregiver ability to obtain necessary supplies Assess patient/caregiver ability to perform ulcer/skin care regimen upon admission and as needed Assess ulceration(s) every visit Provide education on ulcer and skin care Treatment Activities: Topical wound management initiated : 10/19/2021 Notes: 11/16/21: Wound care continues, patient not compliant with edema  control. Electronic Signature(s) Signed: 12/14/2021 10:30:00 AM By: [name] Previous Signature: 12/14/2021 10:29:09 AM Version By: [name] Entered By: [name] on 12/14/2021 10:29:59 -------------------------------------------------------------------------------- Pain Assessment Details Patient Name: Date of Service: [name], [name] 12/14/2021 9:30 A M Medical Record Number: [id number] Patient Account Number: [id number] Date of Birth/Sex: Treating RN: 27-Nov-1940 (81 y.o. [name] Primary Care Welcome Fults: [name] Other Clinician: Referring Younique Casad: Treating Tashay Bozich/Extender: Azucena Kuba in Treatment: 8 Active Problems Location of Pain Severity and Description of Pain Patient Has Paino No Site Locations Pain Management and Medication Current Pain Management: Electronic Signature(s) Signed: 12/14/2021 5:13:46 PM By: [name] Signed: 12/15/2021 8:35:29 AM By: Erenest Blank Entered By: Erenest Blank on 12/14/2021 10:06:00 -------------------------------------------------------------------------------- Patient/Caregiver Education Details Patient Name: Date of Service: [place] [name] 7/5/2023andnbsp9:30 A M Medical Record Number: [id number] Patient Account Number: [id number] Date of Birth/Gender: Treating RN: November 27, 1940 (81 y.o. [name] Primary Care Physician: [name] Other Clinician: Referring Physician: Treating Physician/Extender: Azucena Kuba in Treatment: 8 Education Assessment Education Provided To: Patient Education Topics Provided Venous: Methods: Explain/Verbal, Printed Responses: State content correctly Wound/Skin Impairment: Methods: Demonstration, Explain/Verbal, Printed Responses: State content correctly Electronic Signature(s) Signed: 12/14/2021 5:13:46 PM By: [name] Entered By: [name] on 12/14/2021  10:30:19 -------------------------------------------------------------------------------- Wound Assessment Details Patient Name: Date of Service: [name], [name] 12/14/2021 9:30 A M Medical Record Number: [id number] Patient Account Number: [id number] Date of Birth/Sex: Treating RN: [birth year] (81 y.o. 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 Location: Left, Lateral Foot Wound Open Wounding Event: Gradually Appeared Status: Date Acquired: 07/25/2021 Comorbid Anemia, Chronic Obstructive Pulmonary Disease (COPD), Weeks Of Treatment: 8 History: Hypertension, Peripheral Venous Disease, Type II Diabetes, Clustered Wound: No Osteoarthritis, Neuropathy Photos Wound Measurements Length: (cm) 6 Width: (cm) 5.1 Depth: (cm) 0.2 Area: (cm) 24.033 Volume: (cm) 4.807 % Reduction in Area: -23.4% % Reduction in Volume: -23.4% Epithelialization: None Tunneling: No Undermining: No Wound Description Classification: Full Thickness Without Exposed Support Structures Wound Margin: Distinct, outline attached Exudate Amount: Large Exudate Type: Serosanguineous Exudate Color: red, brown Foul Odor After Cleansing: No Slough/Fibrino Yes Wound Bed Granulation Amount: Medium (34-66%) Exposed Structure Granulation Quality: Red, Pink Fascia Exposed: No Necrotic Amount: Medium (34-66%) Fat Layer (Subcutaneous Tissue) Exposed: Yes Necrotic Quality: Adherent Slough Tendon Exposed: No Muscle Exposed: No Joint Exposed: No Bone Exposed: No Treatment Notes Wound #  6 (Foot) Wound Laterality: Left, Lateral Cleanser Soap and Water Discharge Instruction: May shower and wash wound with dial antibacterial soap and water prior to dressing change. Peri-Wound Care Zinc Oxide Ointment 30g  tube Discharge Instruction: Apply Zinc Oxide to periwound with each dressing change Topical Primary Dressing Santyl Ointment Discharge Instruction: Apply nickel thick amount to wound bed as instructed Secondary Dressing ABD Pad, 5x9 Discharge Instruction: Apply over primary dressing as directed. Woven Gauze Sponge, Non-Sterile 4x4 in Discharge Instruction: Apply over primary dressing as directed. Zetuvit Plus 4x8 in Discharge Instruction: Apply over primary dressing as directed. Secured With Elastic Bandage 4 inch (ACE bandage) Discharge Instruction: Secure with ACE bandage as directed. Kerlix Roll Sterile, 4.5x3.1 (in/yd) Discharge Instruction: Secure with Kerlix as directed. 26M Medipore Soft Cloth Surgical T 2x10 (in/yd) ape Discharge Instruction: Secure with tape as directed. Compression Wrap Elastic 4" Bandage (ACE) Discharge Instruction: Apply from base of toes to just below knee Compression Stockings Add-Ons Electronic Signature(s) Signed: 12/14/2021 5:13:46 PM By: [name] Signed: 12/15/2021 8:35:29 AM By: Erenest Blank Entered By: Erenest Blank on 12/14/2021 10:18:03 -------------------------------------------------------------------------------- Vitals Details Patient Name: Date of Service: [name], [name] 12/14/2021 9:30 A M Medical Record Number: [id number] Patient Account Number: [id number] Date of Birth/Sex: Treating RN: 30-Jan-1941 (81 y.o. [name] Primary Care Latania Bascomb: [name] Other Clinician: Referring Arryn Terrones: Treating Tyeler Goedken/Extender: Azucena Kuba in Treatment: 8 Vital Signs Time Taken: 10:05 Temperature (F): 98.2 Height (in): 66 Pulse (bpm): 71 Weight (lbs): 184 Respiratory Rate (breaths/min): 18 Body Mass Index (BMI): 29.7 Blood Pressure (mmHg): 152/89 Reference Range: 80 - 120 mg / dl Electronic Signature(s) Signed: 12/15/2021 8:35:29 AM By: Erenest Blank Entered By: Erenest Blank on  12/14/2021 10:05:54

## 2021-12-21 ENCOUNTER — Encounter (HOSPITAL_BASED_OUTPATIENT_CLINIC_OR_DEPARTMENT_OTHER): Payer: Medicare HMO | Admitting: Physician Assistant

## 2021-12-21 DIAGNOSIS — E11621 Type 2 diabetes mellitus with foot ulcer: Secondary | ICD-10-CM | POA: Diagnosis not present

## 2021-12-21 DIAGNOSIS — L97522 Non-pressure chronic ulcer of other part of left foot with fat layer exposed: Secondary | ICD-10-CM | POA: Diagnosis not present

## 2021-12-21 DIAGNOSIS — L958 Other vasculitis limited to the skin: Secondary | ICD-10-CM | POA: Diagnosis not present

## 2021-12-21 DIAGNOSIS — I890 Lymphedema, not elsewhere classified: Secondary | ICD-10-CM | POA: Diagnosis not present

## 2021-12-21 DIAGNOSIS — E11622 Type 2 diabetes mellitus with other skin ulcer: Secondary | ICD-10-CM | POA: Diagnosis not present

## 2021-12-21 DIAGNOSIS — L97512 Non-pressure chronic ulcer of other part of right foot with fat layer exposed: Secondary | ICD-10-CM | POA: Diagnosis not present

## 2021-12-21 DIAGNOSIS — I87332 Chronic venous hypertension (idiopathic) with ulcer and inflammation of left lower extremity: Secondary | ICD-10-CM | POA: Diagnosis not present

## 2021-12-21 DIAGNOSIS — I87333 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 extremity: Secondary | ICD-10-CM | POA: Diagnosis not present

## 2021-12-21 NOTE — Progress Notes (Addendum)
[name], [name] ([id number]) Visit Report for 12/21/2021 Chief Complaint Document Details Patient Name: Date of Service: [place], [name] 12/21/2021 9:30 A M Medical Record Number: [id number] Patient Account Number: [id number] Date of Birth/Sex: Treating RN: June 25, 1940 (81 y.o. [name] Fernandez Primary Care Provider: [name] Other Clinician: Referring Provider: Treating Provider/Extender: Azucena Kuba in Treatment: 9 Information Obtained from: Patient Chief Complaint Bilateral foot ulcers Electronic Signature(s) Signed: 12/21/2021 9:25:29 AM By: [name] PA-C Entered By: [name] on 12/21/2021 09:25:29 -------------------------------------------------------------------------------- Debridement Details Patient Name: Date of Service: [place], [name] 12/21/2021 9:30 A M Medical Record Number: [id number] Patient Account Number: [id number] Date of Birth/Sex: Treating RN: 11-Mar-1941 (81 y.o. [name] Fernandez Primary Care Provider: [name] Other Clinician: Referring Provider: Treating Provider/Extender: Azucena Kuba in Treatment: 9 Debridement Performed for Assessment: Wound #6 Left,Lateral Foot Performed By: Physician [name], PA Debridement Type: Debridement Level of Consciousness (Pre-procedure): Awake and Alert Pre-procedure Verification/Time Out Yes - 09:49 Taken: Start Time: 09:50 Pain Control: Lidocaine 4% T opical Solution T Area Debrided (L x W): otal 6.8 (cm) x 4.6 (cm) = 31.28 (cm) Tissue and other material debrided: Non-Viable, Slough, Subcutaneous, Slough Level: Skin/Subcutaneous Tissue Debridement Description: Excisional Instrument: Curette Bleeding: Minimum Hemostasis Achieved: Pressure End Time: 09:54 Response to Treatment: Procedure was tolerated well Level of Consciousness (Post- Awake and Alert procedure): Post Debridement Measurements of Total  Wound Length: (cm) 6.8 Width: (cm) 4.6 Depth: (cm) 0.2 Volume: (cm) 4.913 Character of Wound/Ulcer Post Debridement: Stable Post Procedure Diagnosis Same as Pre-procedure Electronic Signature(s) Signed: 12/21/2021 4:33:23 PM By: [name] PA-C Signed: 12/21/2021 5:05:06 PM By: [name] Entered By: [name] on 12/21/2021 09:54:32 -------------------------------------------------------------------------------- HPI Details Patient Name: Date of Service: [name] Fernandez, [name] 12/21/2021 9:30 A M Medical Record Number: [id number] Patient Account Number: [id number] Date of Birth/Sex: Treating RN: May 22, 1941 (81 y.o. [name] Fernandez Primary Care Provider: [name] Other Clinician: Referring Provider: Treating Provider/Extender: Azucena Kuba in Treatment: 9 History of Present Illness HPI Description: 81 year old gentleman known to have swelling of his left lower extremity with some ulceration along the medial ankle has been having these problems for the last 3-4 months and does not know how it came on. No history of injury or no history of any infection in this area. He is known to have diabetes mellitus controlled with diet, hyperlipidemia, hypertension and chronic lumbar back pain with sciatica which is being treated by his PCP. Last hemoglobin A1c was 6.3 in June 2017. Last medical history is significant for esophageal stricture, hiatal hernia, vitamin D deficiency, status post back surgery 3, hernia repair as a child for both groins, hiatal hernia repair, joint replacement and revision of a knee surgery on the right side. He has quit smoking in 1982. He has never had a Doppler study of his lower extremity except remotely when he had knee surgery they looked for a blood clot on the right side. 09/13/2016 -- venous reflux study done on 09/12/2016 showed there is evidence of greater saphenous vein reflux in the left lower extremity and the  small saphenous vein and the posterior thigh is not competent. 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 With these results he definitely needs a referral to the vascular surgeons 09/20/2016 -- he has an appointment to see the vascular surgeons on May 1  10/03/2016 -- patient had a 4 layer compression on his left lower extremity and had a lot of discomfort and pain. 10/11/2016-- was seen by Dr. Althea [name] -- review of his data he recommended staged laser ablation of his great and then small saphenous veins for reduction odd of his venous hypertension. He did examine his right leg with the SonoSite and he has dilatation of the saphenous vein on the right lower extremity too. Since there was no evidence of ulceration he recommended observation of the right lower extremity. He recommended to continue with local wound care at the wound center until his wound was completely healed. 10/18/2016 -- the patient has not been very compliant with his compression, his diet and his diuretics and as a result his lymphedema has increased markedly 10/25/2016 -- he has been compliant this week, has worn his 2 press compression wraps, taken his diuretics and is watching his salt intake. 11/08/2016 -- he had his venous procedure done last week and details of this have been reported and reviewed in his electronic medical record. he had a laser ablation of his great saphenous vein and this was done from mid calf to just below the saphenofemoral junction. He would return next week for ultrasound follow-up. He will then undergo the small saphenous vein ablation in a few weeks. 11/15/2016 -- his postprocedure visit showed good closure of his left great saphenous vein from the mid calf to 1-1/2 cm from the saphenofemoral junction. He had excellent early results from the ablation. The ablation of the left small saphenous vein was planned in a week 12/06/2016 -- he had his small saphenous vein venous ablation a  week before and the duplex showed closure of his small saphenous vein to within half centimeter office saphenous popliteal junction with no DVT and he was pleased with the results. He recommended continue with elevation and compression and to see them back on an as-needed basis 12/19/16; the patient's wound is actually closed. Sometime over the weekend he developed a small abrasion on the lower calf just above the original wound on the left medial malleolus although this is closed as well Readmission: 01/18/18 on evaluation today patient presents after having last been seen in our clinic July 2018. Subsequently he is a reoccurrence of the ulcers on the left ankle both medial and now a new area lateral that had been present back room for about a month he tells me. He states that this has done very well for about a year he really does not know of any injury or anything that happened that would have caused this reopening at this point. He has continued to wear compression stockings which is appropriate. He does not have lymphedema pumps. He has continued to also keep the area clean and dry as best he could. With that being said now that is been reopened is been harder for him to take care of this as far as compression stockings are concerned keeping them clean along with continuing to manage his fluid and swelling. His ABI appears to be great on the left registering at 1.14 today. He has previously undergone a venous ablation. Other than compression/lymphedema pumps he's really done everything that he can to help manage and prevent these ulcers from forming. 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 01/30/18 on evaluation today patient appears to actually be doing very well in regard to his medial ankle ulcer. It's the lateral ulcer that actually is not doing quite as well today. Fortunately he did  have some alginate left ovary start using this on the wounds and the medial ankle actually seems to be doing much  better. The lateral ankle does actually need something to con a help with slough and buildup. The collagen really did not seem to do much for him in that regard. 02/13/18 on evaluation today patient appears to be doing about the same in regard to his ulcers. Unfortunately I do not feel like were making good progress at this time. When I have debrided the wound the slough seems to come back fairly quickly in the lateral ankle ulcer. There does not appear to be any evidence of infection at this time. No fevers, chills, nausea, or vomiting noted at this time. 02/27/18 on evaluation today patient actually appears to be showing some signs of improvement in regard to both wound areas. I'm insurance on the medial aspect of his left lower extremity at the ankle whether or not the alginate may be sticking and causing some tearing off of new skin attempting to grow. With that being said the patient states it does not seem to be doing such just pulls up some of the dried dead skin around. Nonetheless this is something I wanted to watch out for going forward and actually I recommended that he take the dressing off in the shower when you could get it completely wet before removal to see how this does. Subsequently in regard to the lateral ankle ulcer he still has slough noted at this point although I do believe that he is tolerating the Medihoney much better I wish you could have gotten the Santyl but unfortunately the Santyl was too expensive gonna cost him $250. 03/06/18 on evaluation today patient appears to be doing about the same in regard to both ulcers of his left medial and lateral malleolus sites. He's been tolerating the dressing changes without complication. Unfortunately things do not seem to be improving in regard to either side at this time. Overall I think we may need to switch things up a little bit as far as the way we are performing the dressing changes currently. 03/27/18 on evaluation today  patient's wound bed actually appears to be doing much better at both locations in regard to his ankle ulcers. He has been tolerating the dressing changes at this time without complication. Overall I'm very pleased with how things appear. The patient likewise states that he's much better in regard to discomfort at this time. 04/10/18 on evaluation today patient actually appears to be doing well in regard to his left lateral ankle ulcer. He has been tolerating the dressing changes without complication. Fortunately there does not appear to be any evidence of infection. Overall I do feel like he is tolerating the Medihoney without complication. 04/24/18 upon evaluation today patient's wound actually appears to be healed on the lateral aspect of his ankle the medial aspect still continues to remain close without any evidence of complication at this time. Overall I have been extremely pleased with how things have progressed up to this point. The patient likewise is very happy he's not having any significant pain this time. He does wears compression stocking on a regular basis. Readmission: 10-19-2021 upon evaluation today patient presents for evaluation here in the clinic concerning issues with a left lateral ulcer as well as a right lateral ulcer both along the aspect of his foot getting close to the ankle. This left area is the same place that I treated back in 2024. This does appear to  be more of a vasculitis type situation based on what I am seeing. This appears to be very inflamed and is also very painful. In the past he has been completely unable to tolerate the use of any compression wraps unfortunately. There does not appear to be any evidence of active infection locally nor systemically at this time which is good news. I do believe however we need to try to see what we can do to calm down the inflammation I think triamcinolone topically as well as oral prednisone would likely be indicated in this  case. Patient's medical history really has not changed significantly since I last saw him in actually 2019 though I think is stated 2020 above. 10-26-21 upon evaluation today patient appears to still be having a lot of irritation and inflammation in regard to the ankles laterally. The left is greater than the right. With that being said I do believe that he would benefit from a biopsy to confirm whether or not this may indeed be vasculitis. It actually seems like that physically and on examination but again I want to be sure that we are on the right track as far as treatment is concerned. The good news is he did not have a DVT I called him about that on Friday this was all in regard to the right leg. Overall I am very pleased in that regard. 11-02-2021 upon evaluation today patient appears to be doing poorly still in regard to his left lower extremity. Apparently he had a fever on Sunday which was around 101 and he was severely sick feeling and disoriented according to his wife. She came with him today because she did not think that he would actually tell me what was going on. With that being said unfortunately he did not go to the hospital he actually has been doing a little better over the past few days he been taking amoxicillin he has not taken any other medications orally at this point that are different. He has not been on any antibiotics either more recently other than the amoxicillin. Nonetheless he has not had a repeat in the past 3 days of that issue. 11-09-2021 upon evaluation today patient presents after having had quite an ordeal over the past several days. Subsequently yesterday I got a call from [facility] here in [place] concerning the patient and the fact that he was having bleeding that been going on for the past 24 hours and was not stopping. Subsequently my advice was to have the patient go to the ER for further evaluation and treatment. It seems to me that he had a regular way part of  the wound down to the point that a blood vessel and open. Apparently he was filling up trash bags wrapped around his foot with blood. In fact his lab review which I did do him as well today showed that he went from a white hemoglobin on 09-14-2021 of 13.6 down to a hemoglobin of 11.2 on 11-08-2021 this was yesterday. Subsequently this is due to the amount of blood loss that he has had acutely. Again I do not believe he is at transfusion stage but at the same time he is extremely weak and states that a couple times when he is going to stand up he is actually fallen back into his chair. Nonetheless I do believe that he probably needs to supplement with iron and I did recommend that there are some over-the-counter products he could get in this regard. Subsequently also suggested that  the patient needs to be drinking plenty of water he apparently drinks Coke and he drinks coffee but that is about it. Subsequently following this conversation his wife was present during this time as well and I do think that he is going to try to drink more I think it is of utmost importance. With that being said I did review his PCR culture as well it was positive for 2 organisms. This was Pseudomonas and Enterobacter. Both were showing up as being very prevalent in the PCR culture and subsequently Cipro will take care of the situation which is what I am recommending at this point based on what we see. This will take the place of the Bactrim and can have him discontinue the Bactrim the only thing is he is getting need to stop taking the hydroxyzine if he has been taking it that is the Vistaril and he tells me that he only takes this when he itches he has not been taking it recently. 6/7; patient arrives in clinic today with the wound on the right foot actually looking some better the area on the left lateral looking about the same. Problematically, he is having edema fluid leakage on the medial part of the left foot and ankle  causing skin breakdown but no open wound. He has been very resistant to the notion of compression wraps. He has been using Santyl on both wound areas at home changing the dressing himself Partway through our visit today it became clear he had had a fall on Sunday 3 days ago. Since then he has had a lot of problems with his right shoulder 11-23-2021 upon evaluation today patient appears to be doing well with regard to his wound. Has been tolerating dressing changes without complication. Fortunately there does not appear to be any signs of infection he was wrapped last week when Dr. Dellia Nims saw him on the left. He was having some breakdown medially which they were concerned about. He did keep the wrap on until Monday but states it got extremely wet. For that reason I am thinking of doing a nurse visit on Friday so that we can get some of the swelling down right now by Friday we should have a lot of that out that we can switch out and put on a fresh dressing to get him through till next Wednesday. He is in agreement with giving that a try. 11-30-2021 upon evaluation today patient actually appears to be doing better with regard to the overall appearance of his wound. Fortunately there does not appear to be any signs of active infection locally or systemically which is great news. With that being said he has been tolerating the dressing changes without complication other than the fact that from Wednesday to Friday he did well with the compression wrap from Friday till yesterday he tells me the drainage was so bad and the smell was so bad he had to take this off. It does appear that he is very macerated around the lower portion down around the heel and I do think that this is an issue. Unfortunately we cannot get home health to help with this currently. 12-07-2021 upon evaluation today patient appears to be doing well currently in regard to his wound. He is actually showing signs of excellent improvement  which is great news. I am very pleased with where things stand. I do think that the progress is being made. With that being said the patient is tolerating the Santyl quite well and I  think is doing an awesome job for him. 12-14-2021 upon evaluation today patient appears to be doing well with regard to his wounds he is making progress is still draining a lot however which I completely understand. With that being said fortunately there does not appear to be any signs of active infection locally or systemically at this time. 12-21-2021 upon evaluation today patient appears to be doing well currently in regard to his wound. He is actually showing signs of significant improvement this is slow but nonetheless week by week we are seeing this improve on the lateral aspect of his foot. The measurements are little bit better but more importantly the wound surface is actually a whole lot cleaner. The patient is pleased to hear this. With that being said he tells me that it is really hard for him to see exactly what all is going on. Nonetheless I am very pleased with all the new red tissue that were seeing granulating and hopefully we get this clean shortly and we can even switch to a different dressing to try to get it to close. Electronic Signature(s) Signed: 12/21/2021 10:04:23 AM By: [name] PA-C Entered By: [name] on 12/21/2021 10:04:22 -------------------------------------------------------------------------------- Physical Exam Details Patient Name: Date of Service: [place], [name] 12/21/2021 9:30 A M Medical Record Number: [id number] Patient Account Number: [id number] Date of Birth/Sex: Treating RN: 06-10-1941 (81 y.o. [name] Fernandez Primary Care Provider: [name] Other Clinician: Referring Provider: Treating Provider/Extender: Azucena Kuba in Treatment: 9 Constitutional Well-nourished and well-hydrated in no acute  distress. Respiratory normal breathing without difficulty. Psychiatric this patient is able to make decisions and demonstrates good insight into disease process. Alert and Oriented x 3. pleasant and cooperative. Notes Upon inspection patient's wound bed actually showed signs of good granulation epithelization at this point. Fortunately I do not see any evidence of active infection locally or systemically which is great news and overall I am very pleased with where we stand I did perform debridement of clearway necrotic debris slough and biofilm down to good subcutaneous tissue and this looks much better postdebridement. Electronic Signature(s) Signed: 12/21/2021 10:05:09 AM By: [name] PA-C Entered By: [name] on 12/21/2021 10:05:08 -------------------------------------------------------------------------------- Physician Orders Details Patient Name: Date of Service: [place], [name] 12/21/2021 9:30 A M Medical Record Number: [id number] Patient Account Number: [id number] Date of Birth/Sex: Treating RN: 24-Sep-1940 (81 y.o. 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 - 12/28/21 @ 8:45am with Bernette Redbird, Room 7) Return A ppointment in 2 weeks. - 01/04/22 @ 9:30 am with Bernette Redbird, Room 7) Return A Bathing/  Shower/ Hygiene May shower and wash wound with soap and water. - wash with antibacterial soap when changing dressing Edema Control - Lymphedema / SCD / Other Elevate legs to the level of the heart or above for 30 minutes daily and/or when sitting, a frequency  of: - throughout the day Patient to wear own compression stockings every day. - Use stocking daily to right leg Moisturize legs daily. - Eucerin (in jar) Additional Orders / Instructions Follow Nutritious Diet Non Wound Condition pply the following to affected area as directed: - Apply ABD pad to left medial leg A Wound Treatment Wound #6 - Foot Wound Laterality: Left, Lateral Cleanser: Soap and Water 1 x Per Day/30 Days Discharge Instructions: May shower and wash wound with dial antibacterial soap and water prior to dressing change. Peri-Wound Care: Zinc Oxide Ointment 30g tube 1 x Per Day/30 Days Discharge Instructions: Apply Zinc Oxide to periwound with each dressing change Prim Dressing: Santyl Ointment 1 x Per Day/30 Days ary Discharge Instructions: Apply nickel thick amount to wound bed as instructed Secondary Dressing: ABD Pad, 5x9 1 x Per Day/30 Days Discharge Instructions: Apply over primary dressing as directed. Secondary Dressing: Woven Gauze Sponge, Non-Sterile 4x4 in 1 x Per Day/30 Days Discharge Instructions: Apply over primary dressing as directed. Secondary Dressing: Zetuvit Plus 4x8 in 1 x Per Day/30 Days Discharge Instructions: Apply over primary dressing as directed. Secured With: Elastic Bandage 4 inch (ACE bandage) 1 x Per Day/30 Days Discharge Instructions: Secure with ACE bandage as directed. Secured With: [employer], 4.5x3.1 (in/yd) 1 x Per Day/30 Days Discharge Instructions: Secure with Kerlix as directed. Secured With: 45M Medipore Public affairs consultant Surgical T 2x10 (in/yd) 1 x Per Day/30 Days ape Discharge Instructions: Secure with tape as directed. Compression Wrap: Elastic 4" Bandage (ACE) 1 x Per  Day/30 Days Discharge Instructions: Apply from base of toes to just below knee Patient Medications llergies: latex, Feraheme, Naprosyn, levothyroxine A Notifications Medication Indication Start End 12/21/2021 triamcinolone acetonide DOSE topical 0.1 % cream - cream topical applied to the left leg under the wrap daily where there is itching or rash x 2 weeks or until healed Electronic Signature(s) Signed: 12/21/2021 10:23:08 AM By: [name] PA-C Entered By: [name] on 12/21/2021 10:23:08 -------------------------------------------------------------------------------- Problem List Details Patient Name: Date of Service: [place], [name] 12/21/2021 9:30 A M Medical Record Number: [id number] Patient Account Number: [id number] Date of Birth/Sex: Treating RN: 1941/03/23 (81 y.o. 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 10/19/2021 No Yes L97.522 Non-pressure chronic ulcer of other part of left foot with fat layer exposed 10/19/2021 No Yes L97.512 Non-pressure chronic ulcer of other part of right foot with fat layer exposed 10/19/2021 No Yes E11.622 Type 2 diabetes mellitus with other skin ulcer 10/19/2021 No Yes Inactive Problems Resolved Problems Electronic Signature(s) Signed: 12/21/2021 9:25:21 AM By: [name] PA-C Entered By: [name] on 12/21/2021 09:25:21 -------------------------------------------------------------------------------- Progress Note Details Patient Name: Date of Service: [place], [name] 12/21/2021  9:30 A M Medical Record Number: [id number] Patient Account Number: [id number] Date of Birth/Sex: Treating RN: Oct 26, 1940 (81 y.o. [name] Fernandez Primary Care Provider: [name] Other Clinician: Referring Provider: Treating Provider/Extender: Azucena Kuba in Treatment: 9 Subjective Chief Complaint Information obtained from Patient Bilateral foot ulcers History of Present Illness (HPI) 81 year old gentleman known to have swelling of his left lower extremity  with some ulceration along the medial ankle has been having these problems for the last 3-4 months and does not know how it came on. No history of injury or no history of any infection in this area. He is known to have diabetes mellitus controlled with diet, hyperlipidemia, hypertension and chronic lumbar back pain with sciatica which is being treated by his PCP. Last hemoglobin A1c was 6.3 in June 2017. Last medical history is significant for esophageal stricture, hiatal hernia, vitamin D deficiency, status post back surgery o3, hernia repair as a child for both groins, hiatal hernia repair, joint replacement and revision of a knee surgery on the right side. He has quit smoking in 1982. He has never had a Doppler study of his lower extremity except remotely when he had knee surgery they looked for a blood clot on the right side. 09/13/2016 -- venous reflux study done on 09/12/2016 showed there is evidence of greater saphenous vein reflux in the left lower extremity and the small saphenous vein and the posterior thigh is not competent. 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 With these results he definitely needs a referral to the vascular surgeons 09/20/2016 -- he has an appointment to see the vascular surgeons on May 1 10/03/2016 -- patient had a 4 layer compression on his left lower extremity and had a lot of discomfort and pain. 10/11/2016-- was seen by Dr. Althea [name] -- review  of his data he recommended staged laser ablation of his great and then small saphenous veins for reduction odd of his venous hypertension. He did examine his right leg with the SonoSite and he has dilatation of the saphenous vein on the right lower extremity too. Since there was no evidence of ulceration he recommended observation of the right lower extremity. He recommended to continue with local wound care at the wound center until his wound was completely healed. 10/18/2016 -- the patient has not been very compliant with his compression, his diet and his diuretics and as a result his lymphedema has increased markedly 10/25/2016 -- he has been compliant this week, has worn his 2 press compression wraps, taken his diuretics and is watching his salt intake. 11/08/2016 -- he had his venous procedure done last week and details of this have been reported and reviewed in his electronic medical record. he had a laser ablation of his great saphenous vein and this was done from mid calf to just below the saphenofemoral junction. He would return next week for ultrasound follow-up. He will then undergo the small saphenous vein ablation in a few weeks. 11/15/2016 -- his postprocedure visit showed good closure of his left great saphenous vein from the mid calf to 1-1/2 cm from the saphenofemoral junction. He had excellent early results from the ablation. The ablation of the left small saphenous vein was planned in a week 12/06/2016 -- he had his small saphenous vein venous ablation a week before and the duplex showed closure of his small saphenous vein to within half centimeter office saphenous popliteal junction with no DVT and he was pleased with the results. He recommended continue with elevation and compression and to see them back on an as-needed basis 12/19/16; the patient's wound is actually closed. Sometime over the weekend he developed a small abrasion on the lower calf just above the original wound  on the left medial malleolus although this is closed as well Readmission: 01/18/18 on evaluation today patient presents after having last been seen in our  clinic July 2018. Subsequently he is a reoccurrence of the ulcers on the left ankle both medial and now a new area lateral that had been present back room for about a month he tells me. He states that this has done very well for about a year he really does not know of any injury or anything that happened that would have caused this reopening at this point. He has continued to wear compression stockings which is appropriate. He does not have lymphedema pumps. He has continued to also keep the area clean and dry as best he could. With that being said now that is been reopened is been harder for him to take care of this as far as compression stockings are concerned keeping them clean along with continuing to manage his fluid and swelling. His ABI appears to be great on the left registering at 1.14 today. He has previously undergone a venous ablation. Other than compression/lymphedema pumps he's really done everything that he can to help manage and prevent these ulcers from forming. 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 01/30/18 on evaluation today patient appears to actually be doing very well in regard to his medial ankle ulcer. It's the lateral ulcer that actually is not doing quite as well today. Fortunately he did have some alginate left ovary start using this on the wounds and the medial ankle actually seems to be doing much better. The lateral ankle does actually need something to con a help with slough and buildup. The collagen really did not seem to do much for him in that regard. 02/13/18 on evaluation today patient appears to be doing about the same in regard to his ulcers. Unfortunately I do not feel like were making good progress at this time. When I have debrided the wound the slough seems to come back fairly quickly in the lateral ankle ulcer.  There does not appear to be any evidence of infection at this time. No fevers, chills, nausea, or vomiting noted at this time. 02/27/18 on evaluation today patient actually appears to be showing some signs of improvement in regard to both wound areas. I'm insurance on the medial aspect of his left lower extremity at the ankle whether or not the alginate may be sticking and causing some tearing off of new skin attempting to grow. With that being said the patient states it does not seem to be doing such just pulls up some of the dried dead skin around. Nonetheless this is something I wanted to watch out for going forward and actually I recommended that he take the dressing off in the shower when you could get it completely wet before removal to see how this does. Subsequently in regard to the lateral ankle ulcer he still has slough noted at this point although I do believe that he is tolerating the Medihoney much better I wish you could have gotten the Santyl but unfortunately the Santyl was too expensive gonna cost him $250. 03/06/18 on evaluation today patient appears to be doing about the same in regard to both ulcers of his left medial and lateral malleolus sites. He's been tolerating the dressing changes without complication. Unfortunately things do not seem to be improving in regard to either side at this time. Overall I think we may need to switch things up a little bit as far as the way we are performing the dressing changes currently. 03/27/18 on evaluation today patient's wound bed actually appears to be doing much better at both locations in regard  to his ankle ulcers. He has been tolerating the dressing changes at this time without complication. Overall I'm very pleased with how things appear. The patient likewise states that he's much better in regard to discomfort at this time. 04/10/18 on evaluation today patient actually appears to be doing well in regard to his left lateral ankle ulcer. He  has been tolerating the dressing changes without complication. Fortunately there does not appear to be any evidence of infection. Overall I do feel like he is tolerating the Medihoney without complication. 04/24/18 upon evaluation today patient's wound actually appears to be healed on the lateral aspect of his ankle the medial aspect still continues to remain close without any evidence of complication at this time. Overall I have been extremely pleased with how things have progressed up to this point. The patient likewise is very happy he's not having any significant pain this time. He does wears compression stocking on a regular basis. Readmission: 10-19-2021 upon evaluation today patient presents for evaluation here in the clinic concerning issues with a left lateral ulcer as well as a right lateral ulcer both along the aspect of his foot getting close to the ankle. This left area is the same place that I treated back in 2024. 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 This appears to be very inflamed and is also very painful. In the past he has been completely unable to tolerate the use of any compression wraps unfortunately. There does not appear to be any evidence of active infection locally nor systemically at this time which is good news. I do believe however we need to try to see what we can do to calm down the inflammation I think triamcinolone topically as well as oral prednisone would likely be indicated in this case. Patient's medical history really has not changed significantly since I last saw him in actually 2019 though I think is stated 2020 above. 10-26-21 upon evaluation today patient appears to still be having a lot of irritation and inflammation in regard to the ankles laterally. The left is greater than the right. With that being said I do believe that he would benefit from a biopsy to confirm whether or not this may indeed be vasculitis.  It actually seems like that physically and on examination but again I want to be sure that we are on the right track as far as treatment is concerned. The good news is he did not have a DVT I called him about that on Friday this was all in regard to the right leg. Overall I am very pleased in that regard. 11-02-2021 upon evaluation today patient appears to be doing poorly still in regard to his left lower extremity. Apparently he had a fever on Sunday which was around 101 and he was severely sick feeling and disoriented according to his wife. She came with him today because she did not think that he would actually tell me what was going on. With that being said unfortunately he did not go to the hospital he actually has been doing a little better over the past few days he been taking amoxicillin he has not taken any other medications orally at this point that are different. He has not been on any antibiotics either more recently other than the amoxicillin. Nonetheless he has not had a repeat in the past 3 days of that issue. 11-09-2021 upon evaluation today patient presents after having had quite an  ordeal over the past several days. Subsequently yesterday I got a call from [facility] here in [place] concerning the patient and the fact that he was having bleeding that been going on for the past 24 hours and was not stopping. Subsequently my advice was to have the patient go to the ER for further evaluation and treatment. It seems to me that he had a regular way part of the wound down to the point that a blood vessel and open. Apparently he was filling up trash bags wrapped around his foot with blood. In fact his lab review which I did do him as well today showed that he went from a white hemoglobin on 09-14-2021 of 13.6 down to a hemoglobin of 11.2 on 11-08-2021 this was yesterday. Subsequently this is due to the amount of blood loss that he has had acutely. Again I do not believe he is at transfusion stage but  at the same time he is extremely weak and states that a couple times when he is going to stand up he is actually fallen back into his chair. Nonetheless I do believe that he probably needs to supplement with iron and I did recommend that there are some over-the-counter products he could get in this regard. Subsequently also suggested that the patient needs to be drinking plenty of water he apparently drinks Coke and he drinks coffee but that is about it. Subsequently following this conversation his wife was present during this time as well and I do think that he is going to try to drink more I think it is of utmost importance. With that being said I did review his PCR culture as well it was positive for 2 organisms. This was Pseudomonas and Enterobacter. Both were showing up as being very prevalent in the PCR culture and subsequently Cipro will take care of the situation which is what I am recommending at this point based on what we see. This will take the place of the Bactrim and can have him discontinue the Bactrim the only thing is he is getting need to stop taking the hydroxyzine if he has been taking it that is the Vistaril and he tells me that he only takes this when he itches he has not been taking it recently. 6/7; patient arrives in clinic today with the wound on the right foot actually looking some better the area on the left lateral looking about the same. Problematically, he is having edema fluid leakage on the medial part of the left foot and ankle causing skin breakdown but no open wound. He has been very resistant to the notion of compression wraps. He has been using Santyl on both wound areas at home changing the dressing himself Partway through our visit today it became clear he had had a fall on Sunday 3 days ago. Since then he has had a lot of problems with his right shoulder 11-23-2021 upon evaluation today patient appears to be doing well with regard to his wound. Has been tolerating  dressing changes without complication. Fortunately there does not appear to be any signs of infection he was wrapped last week when Dr. Dellia Nims saw him on the left. He was having some breakdown medially which they were concerned about. He did keep the wrap on until Monday but states it got extremely wet. For that reason I am thinking of doing a nurse visit on Friday so that we can get some of the swelling down right now by Friday we should have a  lot of that out that we can switch out and put on a fresh dressing to get him through till next Wednesday. He is in agreement with giving that a try. 11-30-2021 upon evaluation today patient actually appears to be doing better with regard to the overall appearance of his wound. Fortunately there does not appear to be any signs of active infection locally or systemically which is great news. With that being said he has been tolerating the dressing changes without complication other than the fact that from Wednesday to Friday he did well with the compression wrap from Friday till yesterday he tells me the drainage was so bad and the smell was so bad he had to take this off. It does appear that he is very macerated around the lower portion down around the heel and I do think that this is an issue. Unfortunately we cannot get home health to help with this currently. 12-07-2021 upon evaluation today patient appears to be doing well currently in regard to his wound. He is actually showing signs of excellent improvement which is great news. I am very pleased with where things stand. I do think that the progress is being made. With that being said the patient is tolerating the Santyl quite well and I think is doing an awesome job for him. 12-14-2021 upon evaluation today patient appears to be doing well with regard to his wounds he is making progress is still draining a lot however which I completely understand. With that being said fortunately there does not appear to be  any signs of active infection locally or systemically at this time. 12-21-2021 upon evaluation today patient appears to be doing well currently in regard to his wound. He is actually showing signs of significant improvement this is slow but nonetheless week by week we are seeing this improve on the lateral aspect of his foot. The measurements are little bit better but more importantly the wound surface is actually a whole lot cleaner. The patient is pleased to hear this. With that being said he tells me that it is really hard for him to see exactly what all is going on. Nonetheless I am very pleased with all the new red tissue that were seeing granulating and hopefully we get this clean shortly and we can even switch to a different dressing to try to get it to close. Objective Constitutional Well-nourished and well-hydrated in no acute distress. Vitals Time Taken: 9:30 AM, Height: 66 in, Weight: 184 lbs, BMI: 29.7, Temperature: 98.0 F, Pulse: 72 bpm, Respiratory Rate: 18 breaths/min, Blood Pressure: 134/75 mmHg. Respiratory normal breathing without difficulty. Psychiatric this patient is able to make decisions and demonstrates good insight into disease process. Alert and Oriented x 3. pleasant and cooperative. General Notes: Upon inspection patient's wound bed actually showed signs of good granulation epithelization at this point. Fortunately I do not see any evidence of active infection locally or systemically which is great news and overall I am very pleased with where we stand I did perform debridement of clearway necrotic debris slough and biofilm down to good subcutaneous tissue and this looks much better postdebridement. Integumentary (Hair, Skin) Wound #6 status is Open. Original cause of wound was Gradually Appeared. The date acquired was: 07/25/2021. The wound has been in treatment 9 weeks. The wound is located on the Left,Lateral Foot. The wound measures 6.8cm length x 4.6cm width x  0.2cm depth; 24.567cm^2 area and 4.913cm^3 volume. There is Fat Layer (Subcutaneous Tissue) exposed. There is no  tunneling or undermining noted. There is a large amount of serosanguineous drainage noted. The wound margin is distinct with the outline attached to the wound base. There is medium (34-66%) red, pink granulation within the wound bed. There is a medium (34-66%) amount of necrotic tissue within the wound bed including Adherent Slough. 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 There was a Excisional Skin/Subcutaneous Tissue Debridement with a total area of 31.28 sq cm performed by [name], PA. With the following instrument(s): Curette to remove Non-Viable tissue/material. Material removed includes Subcutaneous Tissue and Slough and after achieving pain control using Lidocaine 4% T opical Solution. No specimens were taken. A time out was conducted at 09:49, prior to the start of the procedure. A Minimum amount of bleeding was controlled with Pressure. The procedure was tolerated well. Post Debridement Measurements: 6.8cm length x 4.6cm width x 0.2cm depth; 4.913cm^3 volume. Character of Wound/Ulcer Post Debridement is stable. Post procedure Diagnosis Wound #6: Same as Pre-Procedure Plan Follow-up Appointments: Return Appointment in 1 week. - 12/28/21 @ 8:45am with Bernette Redbird, Room 7) Return Appointment in 2 weeks. - 01/04/22 @ 9:30 am with Bernette Redbird, Room 7) Bathing/ Shower/ Hygiene: May shower and wash wound  with soap and water. - wash with antibacterial soap when changing dressing Edema Control - Lymphedema / SCD / Other: Elevate legs to the level of the heart or above for 30 minutes daily and/or when sitting, a frequency of: - throughout the day Patient to wear own compression stockings every day. - Use stocking daily to right leg Moisturize legs daily. - Eucerin (in jar) Additional Orders / Instructions: Follow Nutritious Diet Non Wound Condition: Apply the following to affected area as directed: - Apply ABD pad to left medial leg The following medication(s) was prescribed: triamcinolone acetonide topical 0.1 % cream cream topical applied to the left leg under the wrap daily where there is itching or rash x 2 weeks or until healed starting 12/21/2021 WOUND #6: - Foot Wound Laterality: Left, Lateral Cleanser: Soap and Water 1 x Per Day/30 Days Discharge Instructions: May shower and wash wound with dial antibacterial soap and water prior to dressing change. Peri-Wound Care: Zinc Oxide Ointment 30g tube 1 x Per Day/30 Days Discharge Instructions: Apply Zinc Oxide to periwound with each dressing change Prim Dressing: Santyl Ointment 1 x Per Day/30 Days ary Discharge Instructions: Apply nickel thick amount to wound bed as instructed Secondary Dressing: ABD Pad, 5x9 1 x Per Day/30 Days Discharge Instructions: Apply over primary dressing as directed. Secondary Dressing: Woven Gauze Sponge, Non-Sterile 4x4 in 1 x Per Day/30 Days Discharge Instructions: Apply over primary dressing as directed. Secondary Dressing: Zetuvit Plus 4x8 in 1 x Per Day/30 Days Discharge Instructions: Apply over primary dressing as directed. Secured With: Elastic Bandage 4 inch (ACE bandage) 1 x Per Day/30 Days Discharge Instructions: Secure with ACE bandage as directed. Secured With: [employer], 4.5x3.1 (in/yd) 1 x Per Day/30 Days Discharge Instructions: Secure with Kerlix as directed. Secured With: 13M Medipore  Public affairs consultant Surgical T 2x10 (in/yd) 1 x Per Day/30 Days ape  Discharge Instructions: Secure with tape as directed. Com pression Wrap: Elastic 4" Bandage (ACE) 1 x Per Day/30 Days Discharge Instructions: Apply from base of toes to just below knee 1. I would recommend currently that we go ahead and continue with the wound care measures as before and the patient is in agreement with plan. This includes the use of the Santyl to the lateral wound with zinc around the edges of the wound. Subsequently will use an ABD pad to cover and he is Zetuvit as well to catch the excess of drainage. 2. Also can recommend the patient should continue to use the elastic bandage and can have him use the triamcinolone to the leg in general to see if this will help with his itching associated with this. 3. I am also can recommend the patient should continue to monitor for any signs of worsening or infection if anything changes he should let me know. We will see patient back for reevaluation in 1 week here in the clinic. If anything worsens or changes patient will contact our office for additional recommendations. Electronic Signature(s) Signed: 12/21/2021 10:23:25 AM By: [name] PA-C Previous Signature: 12/21/2021 10:06:04 AM Version By: [name] PA-C Entered By: [name] on 12/21/2021 10:23:25 -------------------------------------------------------------------------------- SuperBill Details Patient Name: Date of Service: [place], [name] 12/21/2021 Medical Record Number: [id number] Patient Account Number: [id number] Date of Birth/Sex: Treating RN: 10-15-1940 (81 y.o. 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 layer exposed E11.622 Type 2 diabetes mellitus with other skin ulcer Facility Procedures CPT4 Code: 92119417 Description: 40814 - DEB SUBQ TISSUE 20 SQ CM/< ICD-10 Diagnosis Description L97.522 Non-pressure chronic ulcer of other part of left foot with fat layer exposed Modifier: Quantity: 1 CPT4 Code: [id number] Description: 49702 - DEB SUBQ TISS EA ADDL 20CM ICD-10 Diagnosis Description L97.522 Non-pressure chronic ulcer of other part of left foot with fat layer exposed Modifier: Quantity: 1 Physician Procedures : CPT4 Code Description Modifier [id number] 50277 - WC PHYS LEVEL 4 - EST PT 25 ICD-10 Diagnosis Description I87.333 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 extremity I89.0 Lymphedema, not elsewhere classified  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

## 2021-12-21 NOTE — Progress Notes (Signed)
MICKEY, ESGUERRA ([id number]) Visit Report for 12/21/2021 Arrival Information Details Patient Name: Date of Service: [place], [name] 12/21/2021 9:30 A M Medical Record Number: [id number] Patient Account Number: [id number] Date of Birth/Sex: Treating RN: May 11, 1941 (81 y.o. [name] Primary Care Piper Hassebrock: [name] Other Clinician: Referring Kourtney Terriquez: Treating Kiowa Hollar/Extender: Azucena Kuba in Treatment: 9 Visit Information History Since Last Visit Added or deleted any medications: No Patient Arrived: [name] Any new allergies or adverse reactions: No Arrival Time: 09:25 Had a fall or experienced change in No Transfer Assistance: None activities of daily living that may affect Patient Identification Verified: Yes risk of falls: Secondary Verification Process Completed: Yes Signs or symptoms of abuse/neglect since last visito No Patient Requires Transmission-Based Precautions: No Hospitalized since last visit: No Patient Has Alerts: No Implantable device outside of the clinic excluding No cellular tissue based products placed in the center since last visit: Has Dressing in Place as Prescribed: Yes Has Compression in Place as Prescribed: No Pain Present Now: Yes Notes Patient states he hasn't been using ACE wraps Electronic Signature(s) Signed: 12/21/2021 5:05:06 PM By: [name] Entered By: [name] on 12/21/2021 09:56:41 -------------------------------------------------------------------------------- Encounter Discharge Information Details Patient Name: Date of Service: [place], [name] 12/21/2021 9:30 A M Medical Record Number: [id number] Patient Account Number: [id number] Date of Birth/Sex: Treating RN: Dec 28, 1940 (81 y.o. [name] Primary Care Asako Saliba: [name] Other Clinician: Referring Nasim Garofano: Treating Alphonse Asbridge/Extender: Azucena Kuba in Treatment: 9 Encounter  Discharge Information Items Post Procedure Vitals Discharge Condition: Stable Temperature (F): 98.0 Ambulatory Status: Cane Pulse (bpm): 72 Discharge Destination: Home Respiratory Rate (breaths/min): 18 Transportation: Private Auto Blood Pressure (mmHg): 134/75 Schedule Follow-up Appointment: Yes Clinical Summary of Care: Provided on 12/21/2021 Form Type Recipient Paper Patient Patient Electronic Signature(s) Signed: 12/21/2021 5:05:06 PM By: [name] Entered By: [name] on 12/21/2021 09:59:18 -------------------------------------------------------------------------------- Lower Extremity Assessment Details Patient Name: Date of Service: [place], [name] 12/21/2021 9:30 A M Medical Record Number: [id number] Patient Account Number: [id number] Date of Birth/Sex: Treating RN: [birth year] (81 y.o. [name] Primary Care Sherlonda Flater: [name] Other Clinician: Referring Kynlea Blackston: Treating Grettel Rames/Extender: Azucena Kuba in Treatment: 9 Edema Assessment Assessed: [Left: Yes] Patrice Paradise: No] Edema: [Left: Ye] [Right: s] Calf Left: Right: Point of Measurement: 32 cm From Medial Instep 43 cm Ankle Left: Right: Point of Measurement: 10 cm From Medial Instep 25.8 cm Vascular Assessment Pulses: Dorsalis Pedis Palpable: [Left:Yes] Electronic Signature(s) Signed: 12/21/2021 5:05:06 PM By: [name] Entered By: [name] on 12/21/2021 09:37:38 -------------------------------------------------------------------------------- Multi-Disciplinary Care Plan Details Patient Name: Date of Service: [place], [name] 12/21/2021 9:30 A M Medical Record Number: [id number] Patient Account Number: [id number] Date of Birth/Sex: Treating RN: [birth year] (81 y.o. [name] Primary Care [place]: [name] Other Clinician: Referring Vernor Monnig: Treating Joeangel Jeanpaul/Extender: Azucena Kuba in  Treatment: 9 Active Inactive Venous Leg Ulcer Nursing Diagnoses: Actual venous Insuffiency (use after diagnosis is confirmed) Goals: Patient will maintain optimal edema control Date Initiated: 10/19/2021 Target Resolution Date: 02/08/2022 Goal Status: Active Interventions: Assess peripheral edema status every visit. Compression as ordered Notes: 11/16/21: Edema not controlled, patient not compliant with compression. 12/14/21:Edema not well controlled, patient will not allow compression wraps. Wound/Skin Impairment Nursing Diagnoses: Impaired tissue integrity Goals: Patient/caregiver will verbalize understanding of skin care regimen Date Initiated: 10/19/2021 Target Resolution Date: 02/08/2022  Goal Status: Active Ulcer/skin breakdown will have a volume reduction of 30% by week 4 Date Initiated: 10/19/2021 Date Inactivated: 12/14/2021 Target Resolution Date: 12/14/2021 Unmet Reason: infection, non Goal Status: Unmet compliance Interventions: Assess patient/caregiver ability to obtain necessary supplies Assess patient/caregiver ability to perform ulcer/skin care regimen upon admission and as needed Assess ulceration(s) every visit Provide education on ulcer and skin care Treatment Activities: Topical wound management initiated : 10/19/2021 Notes: 11/16/21: Wound care continues, patient not compliant with edema control. Electronic Signature(s) Signed: 12/21/2021 5:05:06 PM By: [name] Entered By: [name] on 12/21/2021 09:24:58 -------------------------------------------------------------------------------- Pain Assessment Details Patient Name: Date of Service: [place], [name] 12/21/2021 9:30 A M Medical Record Number: [id number] Patient Account Number: [id number] Date of Birth/Sex: Treating RN: March 24, 1941 (81 y.o. [name] Primary Care Fleta Borgeson: [name] Other Clinician: Referring Flynt Breeze: Treating Kanai Berrios/Extender: Azucena Kuba in Treatment: 9 Active Problems Location of Pain Severity and Description of Pain Patient Has Paino Yes Site Locations Pain Location: Pain in Ulcers With Dressing Change: Yes Duration of the Pain. Constant / Intermittento Intermittent Rate the pain. Current Pain Level: 3 Character of Pain Describe the Pain: Tender, Throbbing Pain Management and Medication Current Pain Management: Medication: Yes Cold Application: No Rest: Yes Massage: No Activity: No T.E.N.S.: No Heat Application: No Leg drop or elevation: No Is the Current Pain Management Adequate: Adequate How does your wound impact your activities of daily livingo Sleep: No Bathing: No Appetite: No Relationship With Others: No Bladder Continence: No Emotions: No Bowel Continence: No Work: No Toileting: No Drive: No Dressing: No Hobbies: No Electronic Signature(s) Signed: 12/21/2021 5:05:06 PM By: [name] Entered By: [name] on 12/21/2021 09:31:58 -------------------------------------------------------------------------------- Patient/Caregiver Education Details Patient Name: Date of Service: [place] [name] 7/12/2023andnbsp9:30 A M Medical Record Number: [id number] Patient Account Number: [id number] Date of Birth/Gender: Treating RN: 09-17-1940 (81 y.o. [name] Primary Care Physician: [name] Other Clinician: Referring Physician: Treating Physician/Extender: Azucena Kuba in Treatment: 9 Education Assessment Education Provided To: Patient Education Topics Provided Venous: Methods: Explain/Verbal, Printed Responses: Reinforcements needed Wound/Skin Impairment: Methods: Demonstration, Explain/Verbal, Printed Responses: State content correctly Electronic Signature(s) Signed: 12/21/2021 5:05:06 PM By: [name] Entered By: [name] on 12/21/2021  09:25:19 -------------------------------------------------------------------------------- Wound Assessment Details Patient Name: Date of Service: [name], [name] 12/21/2021 9:30 A M Medical Record Number: [id number] Patient Account Number: [id number] Date of Birth/Sex: Treating RN: Oct 23, 1940 (81 y.o. [name] Primary Care Jodee Wagenaar: [name] Other Clinician: Referring Lucyle Alumbaugh: Treating Shane Badeaux/Extender: Azucena Kuba in Treatment: 9 Wound Status Wound Number: 6 Primary Vasculitis Etiology: Wound Location: Left, Lateral Foot Wound Open Wounding Event: Gradually Appeared Status: Date Acquired: 07/25/2021 Comorbid Anemia, Chronic Obstructive Pulmonary Disease (COPD), Weeks Of Treatment: 9 History: Hypertension, Peripheral Venous Disease, Type II Diabetes, Clustered Wound: No Osteoarthritis, Neuropathy Photos Wound Measurements Length: (cm) 6.8 Width: (cm) 4.6 Depth: (cm) 0.2 Area: (cm) 24.567 Volume: (cm) 4.913 % Reduction in Area: -26.1% % Reduction in Volume: -26.1% Epithelialization: None Tunneling: No Undermining: No Wound Description Classification: Full Thickness Without Exposed Support Structures Wound Margin: Distinct, outline attached Exudate Amount: Large Exudate Type: Serosanguineous Exudate Color: red, brown Foul Odor After Cleansing: No Slough/Fibrino Yes Wound Bed Granulation Amount: Medium (34-66%) Exposed Structure Granulation Quality: Red, Pink Fascia Exposed: No Necrotic Amount: Medium (34-66%) Fat Layer (Subcutaneous Tissue) Exposed: Yes Necrotic Quality: Adherent Slough Tendon Exposed:  No Muscle Exposed: No Joint Exposed: No Bone Exposed: No Treatment Notes Wound #6 (Foot) Wound Laterality: Left, Lateral Cleanser Soap and Water Discharge Instruction: May shower and wash wound with dial antibacterial soap and water prior to dressing change. Peri-Wound Care Zinc Oxide Ointment 30g  tube Discharge Instruction: Apply Zinc Oxide to periwound with each dressing change Topical Primary Dressing Santyl Ointment Discharge Instruction: Apply nickel thick amount to wound bed as instructed Secondary Dressing ABD Pad, 5x9 Discharge Instruction: Apply over primary dressing as directed. Woven Gauze Sponge, Non-Sterile 4x4 in Discharge Instruction: Apply over primary dressing as directed. Zetuvit Plus 4x8 in Discharge Instruction: Apply over primary dressing as directed. Secured With Elastic Bandage 4 inch (ACE bandage) Discharge Instruction: Secure with ACE bandage as directed. Kerlix Roll Sterile, 4.5x3.1 (in/yd) Discharge Instruction: Secure with Kerlix as directed. 39M Medipore Soft Cloth Surgical T 2x10 (in/yd) ape Discharge Instruction: Secure with tape as directed. Compression Wrap Elastic 4" Bandage (ACE) Discharge Instruction: Apply from base of toes to just below knee Compression Stockings Add-Ons Electronic Signature(s) Signed: 12/21/2021 5:05:06 PM By: [name] Entered By: [name] on 12/21/2021 09:39:43 -------------------------------------------------------------------------------- [place] Details Patient Name: Date of Service: [name], [name] 12/21/2021 9:30 A M Medical Record Number: [id number] Patient Account Number: [id number] Date of Birth/Sex: Treating RN: December 02, 1940 (81 y.o. [name] Primary Care Delayna Sparlin: [name] Other Clinician: Referring Faigy Stretch: Treating Jeniel Slauson/Extender: Azucena Kuba in Treatment: 9 Vital Signs Time Taken: 09:30 Temperature (F): 98.0 Height (in): 66 Pulse (bpm): 72 Weight (lbs): 184 Respiratory Rate (breaths/min): 18 Body Mass Index (BMI): 29.7 Blood Pressure (mmHg): 134/75 Reference Range: 80 - 120 mg / dl Electronic Signature(s) Signed: 12/21/2021 5:05:06 PM By: [name] Entered By: [name] on 12/21/2021 09:30:44

## 2021-12-28 ENCOUNTER — Encounter (HOSPITAL_BASED_OUTPATIENT_CLINIC_OR_DEPARTMENT_OTHER): Payer: Medicare HMO | Admitting: Physician Assistant

## 2021-12-28 DIAGNOSIS — L958 Other vasculitis limited to the skin: Secondary | ICD-10-CM | POA: Diagnosis not present

## 2021-12-28 DIAGNOSIS — E11622 Type 2 diabetes mellitus with other skin ulcer: Secondary | ICD-10-CM | POA: Diagnosis not present

## 2021-12-28 DIAGNOSIS — I87333 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 extremity: Secondary | ICD-10-CM | POA: Diagnosis not present

## 2021-12-28 DIAGNOSIS — L97522 Non-pressure chronic ulcer of other part of left foot with fat layer exposed: Secondary | ICD-10-CM | POA: Diagnosis not present

## 2021-12-28 DIAGNOSIS — L97512 Non-pressure chronic ulcer of other part of right foot with fat layer exposed: Secondary | ICD-10-CM | POA: Diagnosis not present

## 2021-12-28 DIAGNOSIS — I890 Lymphedema, not elsewhere classified: Secondary | ICD-10-CM | POA: Diagnosis not present

## 2021-12-28 NOTE — Progress Notes (Signed)
LAWSON, MAHONE ([id number]) Visit Report for 12/28/2021 Arrival Information Details Patient Name: Date of Service: [place], [name] 12/28/2021 8:45 A M Medical Record Number: [id number] Patient Account Number: [id number] Date of Birth/Sex: Treating RN: 1941-05-04 (81 y.o. [name] Primary Care Jillaine Waren: [name] Other Clinician: Referring Casie Sturgeon: Treating Schwanda Zima/Extender: Azucena Kuba in Treatment: 10 Visit Information History Since Last Visit Added or deleted any medications: No Patient Arrived: Ambulatory Any new allergies or adverse reactions: No Arrival Time: 08:54 Had a fall or experienced change in No Accompanied By: wife activities of daily living that may affect Transfer Assistance: None risk of falls: Patient Identification Verified: Yes Signs or symptoms of abuse/neglect since last visito No Secondary Verification Process Completed: Yes Hospitalized since last visit: No Patient Requires Transmission-Based Precautions: No Implantable device outside of the clinic excluding No Patient Has Alerts: No cellular tissue based products placed in the center since last visit: Has Dressing in Place as Prescribed: Yes Pain Present Now: Yes Electronic Signature(s) Signed: 12/28/2021 2:31:45 PM By: [name] Entered By: [name] on 12/28/2021 08:56:34 -------------------------------------------------------------------------------- Encounter Discharge Information Details Patient Name: Date of Service: [place], [name] 12/28/2021 8:45 A M Medical Record Number: [id number] Patient Account Number: [id number] Date of Birth/Sex: Treating RN: Oct 22, 1940 (81 y.o. [name] Primary Care Ambry Dix: [name] Other Clinician: Referring Ailen Strauch: Treating Thresa Dozier/Extender: Azucena Kuba in Treatment: 10 Encounter Discharge Information Items Post Procedure Vitals Discharge Condition:  Stable Temperature (F): 98 Ambulatory Status: Ambulatory Pulse (bpm): 65 Discharge Destination: Home Respiratory Rate (breaths/min): 18 Transportation: Private Auto Blood Pressure (mmHg): 135/69 Accompanied By: wife Schedule Follow-up Appointment: Yes Clinical Summary of Care: Provided on 12/28/2021 Form Type Recipient Paper Patient Patient Electronic Signature(s) Signed: 12/28/2021 2:31:45 PM By: [name] Entered By: [name] on 12/28/2021 09:41:11 -------------------------------------------------------------------------------- Lower Extremity Assessment Details Patient Name: Date of Service: [place], [name] 12/28/2021 8:45 A M Medical Record Number: [id number] Patient Account Number: [id number] Date of Birth/Sex: Treating RN: July 20, 1940 (81 y.o. [name] Primary Care Lakenzie Mcclafferty: [name] Other Clinician: Referring Brookelyn Gaynor: Treating Shavone Nevers/Extender: Azucena Kuba in Treatment: 10 Edema Assessment Assessed: Shirlyn Goltz: Yes] Patrice Paradise: No] Edema: [Left: Ye] [Right: s] Calf Left: Right: Point of Measurement: 32 cm From Medial Instep 39.2 cm Ankle Left: Right: Point of Measurement: 10 cm From Medial Instep 25.4 cm Vascular Assessment Pulses: Dorsalis Pedis Palpable: [Left:Yes] Electronic Signature(s) Signed: 12/28/2021 2:31:45 PM By: [name] Entered By: [name] on 12/28/2021 09:05:11 -------------------------------------------------------------------------------- Multi-Disciplinary Care Plan Details Patient Name: Date of Service: [place], [name] 12/28/2021 8:45 A M Medical Record Number: [id number] Patient Account Number: [id number] Date of Birth/Sex: Treating RN: September 18, 1940 (81 y.o. 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 Compression as ordered Notes: 11/16/21: Edema not controlled, patient not compliant with compression. 12/14/21:Edema not well controlled, patient will not allow compression wraps. Wound/Skin Impairment Nursing Diagnoses: Impaired tissue integrity Goals: Patient/caregiver will verbalize understanding of skin care regimen Date Initiated: 10/19/2021 Target Resolution Date: 02/08/2022 Goal Status: Active Ulcer/skin breakdown will have a volume reduction  of 30% by week 4 Date Initiated: 10/19/2021 Date Inactivated: 12/14/2021 Target Resolution Date: 12/14/2021 Unmet Reason: infection, non Goal Status: Unmet compliance Interventions: Assess patient/caregiver ability to obtain necessary supplies Assess patient/caregiver ability to perform ulcer/skin care regimen upon admission and as needed Assess ulceration(s) every visit Provide education on ulcer and skin care Treatment Activities: Topical wound management initiated : 10/19/2021 Notes: 11/16/21: Wound care continues, patient not compliant with edema control. Electronic Signature(s) Signed: 12/28/2021 2:31:45 PM By: [name] Entered By: [name] on 12/28/2021 08:54:04 -------------------------------------------------------------------------------- Pain Assessment Details Patient Name: Date of Service: [place], [name] 12/28/2021 8:45 A M Medical Record Number: [id number] Patient Account Number: [id number] Date of Birth/Sex: Treating RN: 1941/05/04 (81 y.o. [name] Primary Care Binyomin Brann: [name] Other Clinician: Referring Zed Wanninger: Treating Ramil Edgington/Extender: Azucena Kuba in Treatment: 10 Active  Problems Location of Pain Severity and Description of Pain Patient Has Paino Yes Site Locations Pain Location: Pain in Ulcers With Dressing Change: Yes Duration of the Pain. Constant / Intermittento Intermittent Rate the pain. Current Pain Level: 3 Character of Pain Describe the Pain: Tender, Throbbing Pain Management and Medication Current Pain Management: Medication: Yes Cold Application: No Rest: Yes Massage: No Activity: No T.E.N.S.: No Heat Application: No Leg drop or elevation: No Is the Current Pain Management Adequate: Adequate How does your wound impact your activities of daily livingo Sleep: No Bathing: No Appetite: No Relationship With Others: No Bladder Continence: No Emotions: No Bowel Continence: No Work: No Toileting: No Drive: No Dressing: No Hobbies: No Electronic Signature(s) Signed: 12/28/2021 2:31:45 PM By: [name] Entered By: [name] on 12/28/2021 08:59:53 -------------------------------------------------------------------------------- Patient/Caregiver Education Details Patient Name: Date of Service: [name] 7/19/2023andnbsp8:45 A M Medical Record Number: [id number] Patient Account Number: [id number] Date of Birth/Gender: Treating RN: August 07, 1940 (81 y.o. [name] Primary Care Physician: [name] Other Clinician: Referring Physician: Treating Physician/Extender: Azucena Kuba in Treatment: 10 Education Assessment Education Provided To: Patient Education Topics Provided Venous: Methods: Explain/Verbal, Printed Responses: State content correctly Wound/Skin Impairment: Methods: Demonstration, Explain/Verbal, Printed Responses: State content correctly Electronic Signature(s) Signed: 12/28/2021 2:31:45 PM By: [name] Entered By: [name] on 12/28/2021 08:54:26 -------------------------------------------------------------------------------- Wound  Assessment Details Patient Name: Date of Service: [name], [name] 12/28/2021 8:45 A M Medical Record Number: [id number] Patient Account Number: [id number] Date of Birth/Sex: Treating RN: December 14, 1940 (81 y.o. [name] Primary Care Rheanna Sergent: [name] Other Clinician: Referring Faye Strohman: Treating Rhyse Loux/Extender: Azucena Kuba in Treatment: 10 Wound Status Wound Number: 6 Primary Vasculitis Etiology: Wound Location: Left, Lateral Foot Wound Open Wounding Event: Gradually Appeared Status: Date Acquired: 07/25/2021 Comorbid Anemia, Chronic Obstructive Pulmonary Disease (COPD), Weeks Of Treatment: 10 History: Hypertension, Peripheral Venous Disease, Type II Diabetes, Clustered Wound: No Osteoarthritis, Neuropathy Photos Wound Measurements Length: (cm) 6 Width: (cm) 4.8 Depth: (cm) 0.2 Area: (cm) 22.619 Volume: (cm) 4.524 % Reduction in Area: -16.1% % Reduction in Volume: -16.1% Epithelialization: None Tunneling: No Undermining: No Wound Description Classification: Full Thickness Without Exposed Support Structures Wound Margin: Distinct, outline attached Exudate Amount: Large Exudate Type: Serosanguineous Exudate Color: red, brown Foul Odor After Cleansing: No Slough/Fibrino Yes Wound Bed Granulation Amount: Medium (34-66%) Exposed Structure Granulation Quality: Red, Pink, Hyper-granulation Fascia Exposed: No Necrotic Amount: Medium (34-66%) Fat Layer (Subcutaneous Tissue) Exposed: Yes Necrotic Quality: Adherent Slough Tendon Exposed: No Muscle Exposed: No Joint Exposed: No Bone  Exposed: No Treatment Notes Wound #6 (Foot) Wound Laterality: Left, Lateral Cleanser Soap and Water Discharge Instruction: May shower and wash wound with dial antibacterial soap and water prior to dressing change. Peri-Wound Care Zinc Oxide Ointment 30g tube Discharge Instruction: Apply Zinc Oxide to periwound with each dressing  change Topical Primary Dressing Hydrofera Blue Ready Foam, 4x5 in Discharge Instruction: Apply to wound bed as instructed Santyl Ointment Discharge Instruction: Apply to upper portion of wound only Secondary Dressing ABD Pad, 5x9 Discharge Instruction: Apply over primary dressing as directed. Woven Gauze Sponge, Non-Sterile 4x4 in Discharge Instruction: Apply over primary dressing as directed. Secured With Elastic Bandage 4 inch (ACE bandage) Discharge Instruction: Secure with ACE bandage as directed. Kerlix Roll Sterile, 4.5x3.1 (in/yd) Discharge Instruction: Secure with Kerlix as directed. 7M Medipore Soft Cloth Surgical T 2x10 (in/yd) ape Discharge Instruction: Secure with tape as directed. Compression Wrap Elastic 4" Bandage (ACE) Discharge Instruction: Apply from base of toes to just below knee Compression Stockings Add-Ons Electronic Signature(s) Signed: 12/28/2021 2:31:45 PM By: [name] Entered By: [name] on 12/28/2021 09:06:30 -------------------------------------------------------------------------------- Vitals Details Patient Name: Date of Service: [name], [name] 12/28/2021 8:45 A M Medical Record Number: [id number] Patient Account Number: [id number] Date of Birth/Sex: Treating RN: 1940/12/05 (81 y.o. [name] Primary Care Jeanine Caven: [name] Other Clinician: Referring Trent Theisen: Treating Eddi Hymes/Extender: Azucena Kuba in Treatment: 10 Vital Signs Time Taken: 08:56 Temperature (F): 98 Height (in): 66 Pulse (bpm): 65 Weight (lbs): 184 Respiratory Rate (breaths/min): 18 Body Mass Index (BMI): 29.7 Blood Pressure (mmHg): 135/69 Reference Range: 80 - 120 mg / dl Electronic Signature(s) Signed: 12/28/2021 2:31:45 PM By: [name] Entered By: [name] on 12/28/2021 08:59:11

## 2021-12-28 NOTE — Progress Notes (Addendum)
DONG, NIMMONS ([id number]) Visit Report for 12/28/2021 Chief Complaint Document Details Patient Name: Date of Service: [place], [name] 12/28/2021 8:45 A M Medical Record Number: [id number] Patient Account Number: [id number] Date of Birth/Sex: Treating RN: [birth year] (81 y.o. [name] Primary Care Provider: [name] Other Clinician: Referring Provider: Treating Provider/Extender: Azucena Kuba in Treatment: 10 Information Obtained from: Patient Chief Complaint Bilateral foot ulcers Electronic Signature(s) Signed: 12/28/2021 9:29:54 AM By: [name] PA-C Previous Signature: 12/28/2021 9:29:15 AM Version By: [name] PA-C Entered By: [name] on 12/28/2021 09:29:53 -------------------------------------------------------------------------------- Debridement Details Patient Name: Date of Service: [place], [name] 12/28/2021 8:45 A M Medical Record Number: [id number] Patient Account Number: [id number] Date of Birth/Sex: Treating RN: [birth year] (81 y.o. [name] Primary Care Provider: [name] Other Clinician: Referring Provider: Treating Provider/Extender: Azucena Kuba in Treatment: 10 Debridement Performed for Assessment: Wound #6 Left,Lateral Foot Performed By: Physician [name], PA Debridement Type: Debridement Level of Consciousness (Pre-procedure): Awake and Alert Pre-procedure Verification/Time Out Yes - 09:29 Taken: Start Time: 09:30 Pain Control: Lidocaine 5% topical ointment T Area Debrided (L x W): otal 6 (cm) x 4.8 (cm) = 28.8 (cm) Tissue and other material debrided: Non-Viable, Slough, Subcutaneous, Slough Level: Skin/Subcutaneous Tissue Debridement Description: Excisional Instrument: Curette Bleeding: Minimum Hemostasis Achieved: Pressure End Time: 09:35 Response to Treatment: Procedure was tolerated well Level of Consciousness (Post- Awake  and Alert procedure): Post Debridement Measurements of Total Wound Length: (cm) 6 Width: (cm) 4.8 Depth: (cm) 0.2 Volume: (cm) 4.524 Character of Wound/Ulcer Post Debridement: Stable Post Procedure Diagnosis Same as Pre-procedure Electronic Signature(s) Signed: 12/28/2021 2:31:45 PM By: [name] Signed: 12/28/2021 3:46:42 PM By: [name] PA-C Entered By: [name] on 12/28/2021 09:35:36 -------------------------------------------------------------------------------- HPI Details Patient Name: Date of Service: [name] 12/28/2021 8:45 A M Medical Record Number: [id number] Patient Account Number: [id number] Date of Birth/Sex: Treating RN: 10-08-1940 (81 y.o. [name] Primary Care Provider: [name] Other Clinician: Referring Provider: Treating Provider/Extender: Azucena Kuba in Treatment: 10 History of Present Illness HPI Description: 81 year old gentleman known to have swelling of his left lower extremity with some ulceration along the medial ankle has been having these problems for the last 3-4 months and does not know how it came on. No history of injury or no history of any infection in this area. He is known to have diabetes mellitus controlled with diet, hyperlipidemia, hypertension and chronic lumbar back pain with sciatica which is being treated by his PCP. Last hemoglobin A1c was 6.3 in June 2017. Last medical history is significant for esophageal stricture, hiatal hernia, vitamin D deficiency, status post back surgery 3, hernia repair as a child for both groins, hiatal hernia repair, joint replacement and revision of a knee surgery on the right side. He has quit smoking in 1982. He has never had a Doppler study of his lower extremity except remotely when he had knee surgery they looked for a blood clot on the right side. 09/13/2016 -- venous reflux study done on 09/12/2016 showed there is evidence of  greater saphenous vein reflux in the left lower extremity and the small saphenous vein and the posterior thigh is not competent. 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 With these results he definitely needs a referral to the vascular surgeons 09/20/2016 -- he has  an appointment to see the vascular surgeons on May 1 10/03/2016 -- patient had a 4 layer compression on his left lower extremity and had a lot of discomfort and pain. 10/11/2016-- was seen by Dr. Althea [name] -- review of his data he recommended staged laser ablation of his great and then small saphenous veins for reduction odd of his venous hypertension. He did examine his right leg with the SonoSite and he has dilatation of the saphenous vein on the right lower extremity too. Since there was no evidence of ulceration he recommended observation of the right lower extremity. He recommended to continue with local wound care at the wound center until his wound was completely healed. 10/18/2016 -- the patient has not been very compliant with his compression, his diet and his diuretics and as a result his lymphedema has increased markedly 10/25/2016 -- he has been compliant this week, has worn his 2 press compression wraps, taken his diuretics and is watching his salt intake. 11/08/2016 -- he had his venous procedure done last week and details of this have been reported and reviewed in his electronic medical record. he had a laser ablation of his great saphenous vein and this was done from mid calf to just below the saphenofemoral junction. He would return next week for ultrasound follow-up. He will then undergo the small saphenous vein ablation in a few weeks. 11/15/2016 -- his postprocedure visit showed good closure of his left great saphenous vein from the mid calf to 1-1/2 cm from the saphenofemoral junction. He had excellent early results from the ablation. The ablation of the left small saphenous vein was planned in a  week 12/06/2016 -- he had his small saphenous vein venous ablation a week before and the duplex showed closure of his small saphenous vein to within half centimeter office saphenous popliteal junction with no DVT and he was pleased with the results. He recommended continue with elevation and compression and to see them back on an as-needed basis 12/19/16; the patient's wound is actually closed. Sometime over the weekend he developed a small abrasion on the lower calf just above the original wound on the left medial malleolus although this is closed as well Readmission: 01/18/18 on evaluation today patient presents after having last been seen in our clinic July 2018. Subsequently he is a reoccurrence of the ulcers on the left ankle both medial and now a new area lateral that had been present back room for about a month he tells me. He states that this has done very well for about a year he really does not know of any injury or anything that happened that would have caused this reopening at this point. He has continued to wear compression stockings which is appropriate. He does not have lymphedema pumps. He has continued to also keep the area clean and dry as best he could. With that being said now that is been reopened is been harder for him to take care of this as far as compression stockings are concerned keeping them clean along with continuing to manage his fluid and swelling. His ABI appears to be great on the left registering at 1.14 today. He has previously undergone a venous ablation. Other than compression/lymphedema pumps he's really done everything that he can to help manage and prevent these ulcers from forming. 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 01/30/18 on evaluation today patient appears to actually be doing very well in regard to his medial ankle ulcer. It's the lateral ulcer that actually  is not doing quite as well today. Fortunately he did have some alginate left ovary start using this on  the wounds and the medial ankle actually seems to be doing much better. The lateral ankle does actually need something to con a help with slough and buildup. The collagen really did not seem to do much for him in that regard. 02/13/18 on evaluation today patient appears to be doing about the same in regard to his ulcers. Unfortunately I do not feel like were making good progress at this time. When I have debrided the wound the slough seems to come back fairly quickly in the lateral ankle ulcer. There does not appear to be any evidence of infection at this time. No fevers, chills, nausea, or vomiting noted at this time. 02/27/18 on evaluation today patient actually appears to be showing some signs of improvement in regard to both wound areas. I'm insurance on the medial aspect of his left lower extremity at the ankle whether or not the alginate may be sticking and causing some tearing off of new skin attempting to grow. With that being said the patient states it does not seem to be doing such just pulls up some of the dried dead skin around. Nonetheless this is something I wanted to watch out for going forward and actually I recommended that he take the dressing off in the shower when you could get it completely wet before removal to see how this does. Subsequently in regard to the lateral ankle ulcer he still has slough noted at this point although I do believe that he is tolerating the Medihoney much better I wish you could have gotten the Santyl but unfortunately the Santyl was too expensive gonna cost him $250. 03/06/18 on evaluation today patient appears to be doing about the same in regard to both ulcers of his left medial and lateral malleolus sites. He's been tolerating the dressing changes without complication. Unfortunately things do not seem to be improving in regard to either side at this time. Overall I think we may need to switch things up a little bit as far as the way we are performing the  dressing changes currently. 03/27/18 on evaluation today patient's wound bed actually appears to be doing much better at both locations in regard to his ankle ulcers. He has been tolerating the dressing changes at this time without complication. Overall I'm very pleased with how things appear. The patient likewise states that he's much better in regard to discomfort at this time. 04/10/18 on evaluation today patient actually appears to be doing well in regard to his left lateral ankle ulcer. He has been tolerating the dressing changes without complication. Fortunately there does not appear to be any evidence of infection. Overall I do feel like he is tolerating the Medihoney without complication. 04/24/18 upon evaluation today patient's wound actually appears to be healed on the lateral aspect of his ankle the medial aspect still continues to remain close without any evidence of complication at this time. Overall I have been extremely pleased with how things have progressed up to this point. The patient likewise is very happy he's not having any significant pain this time. He does wears compression stocking on a regular basis. Readmission: 10-19-2021 upon evaluation today patient presents for evaluation here in the clinic concerning issues with a left lateral ulcer as well as a right lateral ulcer both along the aspect of his foot getting close to the ankle. This left area is the same place  that I treated back in 2024. 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 This appears to be very inflamed and is also very painful. In the past he has been completely unable to tolerate the use of any compression wraps unfortunately. There does not appear to be any evidence of active infection locally nor systemically at this time which is good news. I do believe however we need to try to see what we can do to calm down the inflammation I think triamcinolone topically as  well as oral prednisone would likely be indicated in this case. Patient's medical history really has not changed significantly since I last saw him in actually 2019 though I think is stated 2020 above. 10-26-21 upon evaluation today patient appears to still be having a lot of irritation and inflammation in regard to the ankles laterally. The left is greater than the right. With that being said I do believe that he would benefit from a biopsy to confirm whether or not this may indeed be vasculitis. It actually seems like that physically and on examination but again I want to be sure that we are on the right track as far as treatment is concerned. The good news is he did not have a DVT I called him about that on Friday this was all in regard to the right leg. Overall I am very pleased in that regard. 11-02-2021 upon evaluation today patient appears to be doing poorly still in regard to his left lower extremity. Apparently he had a fever on Sunday which was around 101 and he was severely sick feeling and disoriented according to his wife. She came with him today because she did not think that he would actually tell me what was going on. With that being said unfortunately he did not go to the hospital he actually has been doing a little better over the past few days he been taking amoxicillin he has not taken any other medications orally at this point that are different. He has not been on any antibiotics either more recently other than the amoxicillin. Nonetheless he has not had a repeat in the past 3 days of that issue. 11-09-2021 upon evaluation today patient presents after having had quite an ordeal over the past several days. Subsequently yesterday I got a call from [facility] here in [place] concerning the patient and the fact that he was having bleeding that been going on for the past 24 hours and was not stopping. Subsequently my advice was to have the patient go to the ER for further evaluation and  treatment. It seems to me that he had a regular way part of the wound down to the point that a blood vessel and open. Apparently he was filling up trash bags wrapped around his foot with blood. In fact his lab review which I did do him as well today showed that he went from a white hemoglobin on 09-14-2021 of 13.6 down to a hemoglobin of 11.2 on 11-08-2021 this was yesterday. Subsequently this is due to the amount of blood loss that he has had acutely. Again I do not believe he is at transfusion stage but at the same time he is extremely weak and states that a couple times when he is going to stand up he is actually fallen back into his chair. Nonetheless I do believe that he probably needs to supplement with iron and I did recommend that there are some over-the-counter products  he could get in this regard. Subsequently also suggested that the patient needs to be drinking plenty of water he apparently drinks Coke and he drinks coffee but that is about it. Subsequently following this conversation his wife was present during this time as well and I do think that he is going to try to drink more I think it is of utmost importance. With that being said I did review his PCR culture as well it was positive for 2 organisms. This was Pseudomonas and Enterobacter. Both were showing up as being very prevalent in the PCR culture and subsequently Cipro will take care of the situation which is what I am recommending at this point based on what we see. This will take the place of the Bactrim and can have him discontinue the Bactrim the only thing is he is getting need to stop taking the hydroxyzine if he has been taking it that is the Vistaril and he tells me that he only takes this when he itches he has not been taking it recently. 6/7; patient arrives in clinic today with the wound on the right foot actually looking some better the area on the left lateral looking about the same. Problematically, he is having edema  fluid leakage on the medial part of the left foot and ankle causing skin breakdown but no open wound. He has been very resistant to the notion of compression wraps. He has been using Santyl on both wound areas at home changing the dressing himself Partway through our visit today it became clear he had had a fall on Sunday 3 days ago. Since then he has had a lot of problems with his right shoulder 11-23-2021 upon evaluation today patient appears to be doing well with regard to his wound. Has been tolerating dressing changes without complication. Fortunately there does not appear to be any signs of infection he was wrapped last week when Dr. Dellia Nims saw him on the left. He was having some breakdown medially which they were concerned about. He did keep the wrap on until Monday but states it got extremely wet. For that reason I am thinking of doing a nurse visit on Friday so that we can get some of the swelling down right now by Friday we should have a lot of that out that we can switch out and put on a fresh dressing to get him through till next Wednesday. He is in agreement with giving that a try. 11-30-2021 upon evaluation today patient actually appears to be doing better with regard to the overall appearance of his wound. Fortunately there does not appear to be any signs of active infection locally or systemically which is great news. With that being said he has been tolerating the dressing changes without complication other than the fact that from Wednesday to Friday he did well with the compression wrap from Friday till yesterday he tells me the drainage was so bad and the smell was so bad he had to take this off. It does appear that he is very macerated around the lower portion down around the heel and I do think that this is an issue. Unfortunately we cannot get home health to help with this currently. 12-07-2021 upon evaluation today patient appears to be doing well currently in regard to his wound. He  is actually showing signs of excellent improvement which is great news. I am very pleased with where things stand. I do think that the progress is being made. With that being said  the patient is tolerating the Santyl quite well and I think is doing an awesome job for him. 12-14-2021 upon evaluation today patient appears to be doing well with regard to his wounds he is making progress is still draining a lot however which I completely understand. With that being said fortunately there does not appear to be any signs of active infection locally or systemically at this time. 12-21-2021 upon evaluation today patient appears to be doing well currently in regard to his wound. He is actually showing signs of significant improvement this is slow but nonetheless week by week we are seeing this improve on the lateral aspect of his foot. The measurements are little bit better but more importantly the wound surface is actually a whole lot cleaner. The patient is pleased to hear this. With that being said he tells me that it is really hard for him to see exactly what all is going on. Nonetheless I am very pleased with all the new red tissue that were seeing granulating and hopefully we get this clean shortly and we can even switch to a different dressing to try to get it to close. 12-28-2021 upon evaluation today patient appears to be doing well currently in regard to his wound is actually showing signs of improvement we are going to perform some debridement today to clearway some of the necrotic debris. Overall I think that we are at the point where we do not have to use Santyl over the home wound I think using it just on the upper portion may be appropriate if we can get this completely cleaned away. Electronic Signature(s) Signed: 12/28/2021 9:45:10 AM By: [name] PA-C Signed: 12/28/2021 9:45:10 AM By: [name] PA-C Entered By: [name] on 12/28/2021  09:45:10 -------------------------------------------------------------------------------- Physical Exam Details Patient Name: Date of Service: [place], [name] 12/28/2021 8:45 A M Medical Record Number: [id number] Patient Account Number: [id number] Date of Birth/Sex: Treating RN: 03/16/1941 (81 y.o. [name] Primary Care Provider: [name] Other Clinician: Referring Provider: Treating Provider/Extender: Azucena Kuba in Treatment: 3 Constitutional Well-nourished and well-hydrated in no acute distress. Respiratory normal breathing without difficulty. Psychiatric this patient is able to make decisions and demonstrates good insight into disease process. Alert and Oriented x 3. pleasant and cooperative. Notes Upon inspection patient's wound bed actually showed signs of good granulation and epithelization at this point. Fortunately I do not see any evidence of active infection. I did actually perform debridement of clearway some of the necrotic debris and the patient tolerated this today without complication. Fortunately there does not appear to be any signs of active infection locally or systemically at this time. Electronic Signature(s) Signed: 12/28/2021 9:45:27 AM By: [name] PA-C Entered By: [name] on 12/28/2021 09:45:27 -------------------------------------------------------------------------------- Physician Orders Details Patient Name: Date of Service: [place], [name] 12/28/2021 8:45 A M Medical Record Number: [id number] Patient Account Number: [id number] Date of Birth/Sex: Treating RN: [birth year] (81 y.o. 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 - 01/04/22 @ 9:30am with Bernette Redbird, Room 7) Return A Bathing/ Shower/ Hygiene May shower and wash wound with soap and water. - wash with antibacterial soap when changing dressing Edema Control - Lymphedema / SCD / Other Elevate legs to the level of the heart or above for 30 minutes daily and/or when sitting, a frequency of: - throughout the day Patient to wear own compression stockings every day. - Use stocking daily to right leg Moisturize legs daily. - Eucerin (in jar) Additional Orders / Instructions Follow Nutritious Diet Non Wound Condition pply the following to affected area as directed: - Apply ABD pad to left medial leg A Wound Treatment Wound #6 - Foot Wound Laterality: Left, Lateral Cleanser: Soap and Water Every Other Day/30 Days Discharge Instructions: May shower and wash wound with dial antibacterial soap and water prior to dressing change. Peri-Wound Care: Zinc Oxide Ointment 30g tube Every Other Day/30 Days Discharge Instructions: Apply Zinc Oxide to periwound with each dressing change Prim Dressing: Hydrofera Blue Ready Foam, 4x5 in (DME) (Generic) Every Other Day/30 Days ary Discharge Instructions: Apply to wound bed as instructed Prim Dressing: Santyl Ointment Every Other Day/30 Days ary Discharge Instructions: Apply to upper portion of wound only Secondary Dressing: ABD Pad, 5x9 (DME) (Generic) Every Other Day/30 Days Discharge Instructions: Apply over primary dressing as directed. Secondary Dressing: Woven Gauze Sponge, Non-Sterile 4x4 in (DME) (Generic) Every  Other Day/30 Days Discharge Instructions: Apply over primary dressing as directed. Secured With: Elastic Bandage 4 inch (ACE bandage) (DME) (Generic) Every Other Day/30 Days Discharge Instructions: Secure with ACE bandage as directed. Secured With: [employer], 4.5x3.1 (in/yd) (DME) (Generic) Every Other Day/30 Days Discharge Instructions: Secure with Kerlix as directed. Secured With: 67M Medipore Public affairs consultant Surgical T 2x10 (in/yd) (DME) (Generic) Every Other Day/30 Days ape Discharge Instructions: Secure with tape as directed. Compression Wrap: Elastic 4" Bandage (ACE) Every Other Day/30 Days Discharge Instructions: Apply from base of toes to just below knee Electronic Signature(s) Signed: 12/28/2021 2:31:45 PM By: [name] Signed: 12/28/2021 3:46:42 PM By: [name] PA-C Entered By: [name] on 12/28/2021 09:53:53 -------------------------------------------------------------------------------- Problem List Details Patient Name: Date of Service: [place], [name] 12/28/2021 8:45 A M Medical Record Number: [id number] Patient Account Number: [id number] Date of Birth/Sex: Treating RN: 1940-09-08 (81 y.o. 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 10/19/2021 No Yes L97.522 Non-pressure chronic ulcer of other part of left foot with fat layer exposed 10/19/2021 No Yes L97.512 Non-pressure chronic ulcer of other part of right foot with fat layer exposed 10/19/2021 No Yes E11.622 Type 2 diabetes mellitus with other skin ulcer  10/19/2021 No Yes Inactive Problems Resolved Problems Electronic Signature(s) Signed: 12/28/2021 9:29:06 AM By: [name] PA-C Entered By: [name] on 12/28/2021 09:29:06 -------------------------------------------------------------------------------- Progress Note Details Patient Name: Date of Service: [place], [name] 12/28/2021 8:45 A M Medical Record Number: [id number] Patient Account Number: [id number] Date of Birth/Sex: Treating RN: Jul 22, 1940 (81 y.o. [name] Primary Care Provider:  [name] Other Clinician: Referring Provider: Treating Provider/Extender: Azucena Kuba in Treatment: 10 Subjective Chief Complaint Information obtained from Patient Bilateral foot ulcers History of Present Illness (HPI) 81 year old gentleman known to have swelling of his left lower extremity with some ulceration along the medial ankle has been having these problems for the last 3-4 months and does not know how it came on. No history of injury or no history of any infection in this area. He is known to have diabetes mellitus controlled with diet, hyperlipidemia, hypertension and chronic lumbar back pain with sciatica which is being treated by his PCP. Last hemoglobin A1c was 6.3 in June 2017. Last medical history is significant for esophageal stricture, hiatal hernia, vitamin D deficiency, status post back surgery o3, hernia repair as a child for both groins, hiatal hernia repair, joint replacement and revision of a knee surgery on the right side. He has quit smoking in 1982. He has never had a Doppler study of his lower extremity except remotely when he had knee surgery they looked for a blood clot on the right side. 09/13/2016 -- venous reflux study done on 09/12/2016 showed there is evidence of greater saphenous vein reflux in the left lower extremity and the small saphenous vein and the posterior thigh is not competent. There is also deep  venous reflux in the left lower extremity. With these results he definitely needs a referral to the vascular surgeons 09/20/2016 -- he has an appointment to see the vascular surgeons on May 1 10/03/2016 -- patient had a 4 layer compression on his left lower extremity and had a lot of discomfort and pain. 10/11/2016-- was seen by Dr. Althea [name] -- review of his data he recommended staged laser ablation of his great and then small saphenous veins for reduction odd of his venous hypertension. He did examine his right leg with the SonoSite and he has dilatation of the saphenous vein on the right lower extremity too. Since there was no evidence of ulceration he recommended observation of the right lower extremity. He recommended to continue with local wound care at the wound center until his wound was completely healed. 10/18/2016 -- the patient has not been very compliant with his compression, his diet and his diuretics and as a result his lymphedema has increased markedly 10/25/2016 -- he has been compliant this week, has worn his 2 press compression wraps, taken his diuretics and is watching his salt intake. 11/08/2016 -- he had his venous procedure done last week and details of this have been reported and reviewed in his electronic medical record. he had a laser ablation of his great saphenous vein and this was done from mid calf to just below the saphenofemoral junction. He would return next week for ultrasound follow-up. He will then undergo the small saphenous vein ablation in a few weeks. 11/15/2016 -- his postprocedure visit showed good closure of his left great saphenous vein from the mid calf to 1-1/2 cm from the saphenofemoral junction. He had excellent early results from the ablation. The ablation of the left small saphenous vein was planned in a week 12/06/2016 -- he had his small saphenous vein venous ablation a week before and the duplex showed closure of his small saphenous vein to within  half centimeter office saphenous popliteal junction with no DVT and he was pleased with the results. He recommended continue with elevation and compression and to see them back on an as-needed basis 12/19/16; the patient's wound is  actually closed. Sometime over the weekend he developed a small abrasion on the lower calf just above the original wound on the left medial malleolus although this is closed as well Readmission: 01/18/18 on evaluation today patient presents after having last been seen in our clinic July 2018. Subsequently he is a reoccurrence of the ulcers on the left ankle both medial and now a new area lateral that had been present back room for about a month he tells me. He states that this has done very well for about a year he really does not know of any injury or anything that happened that would have caused this reopening at this point. He has continued to wear compression stockings which is appropriate. He does not have lymphedema pumps. He has continued to also keep the area clean and dry as best he could. With that being said now that is been reopened is been harder for him to take care of this as far as compression stockings are concerned keeping them clean along with continuing to manage his fluid and swelling. His ABI appears to be great on the left registering at 1.14 today. He has previously undergone a venous ablation. Other than compression/lymphedema pumps he's really done everything that he can to help manage and prevent these ulcers from forming. 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 01/30/18 on evaluation today patient appears to actually be doing very well in regard to his medial ankle ulcer. It's the lateral ulcer that actually is not doing quite as well today. Fortunately he did have some alginate left ovary start using this on the wounds and the medial ankle actually seems to be doing much better. The lateral ankle does actually need something to con a help with slough and  buildup. The collagen really did not seem to do much for him in that regard. 02/13/18 on evaluation today patient appears to be doing about the same in regard to his ulcers. Unfortunately I do not feel like were making good progress at this time. When I have debrided the wound the slough seems to come back fairly quickly in the lateral ankle ulcer. There does not appear to be any evidence of infection at this time. No fevers, chills, nausea, or vomiting noted at this time. 02/27/18 on evaluation today patient actually appears to be showing some signs of improvement in regard to both wound areas. I'm insurance on the medial aspect of his left lower extremity at the ankle whether or not the alginate may be sticking and causing some tearing off of new skin attempting to grow. With that being said the patient states it does not seem to be doing such just pulls up some of the dried dead skin around. Nonetheless this is something I wanted to watch out for going forward and actually I recommended that he take the dressing off in the shower when you could get it completely wet before removal to see how this does. Subsequently in regard to the lateral ankle ulcer he still has slough noted at this point although I do believe that he is tolerating the Medihoney much better I wish you could have gotten the Santyl but unfortunately the Santyl was too expensive gonna cost him $250. 03/06/18 on evaluation today patient appears to be doing about the same in regard to both ulcers of his left medial and lateral malleolus sites. He's been tolerating the dressing changes without complication. Unfortunately things do not seem to be improving in regard to either side at this  time. Overall I think we may need to switch things up a little bit as far as the way we are performing the dressing changes currently. 03/27/18 on evaluation today patient's wound bed actually appears to be doing much better at both locations in regard to  his ankle ulcers. He has been tolerating the dressing changes at this time without complication. Overall I'm very pleased with how things appear. The patient likewise states that he's much better in regard to discomfort at this time. 04/10/18 on evaluation today patient actually appears to be doing well in regard to his left lateral ankle ulcer. He has been tolerating the dressing changes without complication. Fortunately there does not appear to be any evidence of infection. Overall I do feel like he is tolerating the Medihoney without complication. 04/24/18 upon evaluation today patient's wound actually appears to be healed on the lateral aspect of his ankle the medial aspect still continues to remain close without any evidence of complication at this time. Overall I have been extremely pleased with how things have progressed up to this point. The patient likewise is very happy he's not having any significant pain this time. He does wears compression stocking on a regular basis. Readmission: 10-19-2021 upon evaluation today patient presents for evaluation here in the clinic concerning issues with a left lateral ulcer as well as a right lateral ulcer both along the aspect of his foot getting close to the ankle. This left area is the same place that I treated back in 2024. 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 This appears to be very inflamed and is also very painful. In the past he has been completely unable to tolerate the use of any compression wraps unfortunately. There does not appear to be any evidence of active infection locally nor systemically at this time which is good news. I do believe however we need to try to see what we can do to calm down the inflammation I think triamcinolone topically as well as oral prednisone would likely be indicated in this case. Patient's medical history really has not changed significantly since I last saw him in  actually 2019 though I think is stated 2020 above. 10-26-21 upon evaluation today patient appears to still be having a lot of irritation and inflammation in regard to the ankles laterally. The left is greater than the right. With that being said I do believe that he would benefit from a biopsy to confirm whether or not this may indeed be vasculitis. It actually seems like that physically and on examination but again I want to be sure that we are on the right track as far as treatment is concerned. The good news is he did not have a DVT I called him about that on Friday this was all in regard to the right leg. Overall I am very pleased in that regard. 11-02-2021 upon evaluation today patient appears to be doing poorly still in regard to his left lower extremity. Apparently he had a fever on Sunday which was around 101 and he was severely sick feeling and disoriented according to his wife. She came with him today because she did not think that he would actually tell me what was going on. With that being said unfortunately he did not go to the hospital he actually has been doing a little better over the past few days he been taking amoxicillin he has not taken any other medications orally at  this point that are different. He has not been on any antibiotics either more recently other than the amoxicillin. Nonetheless he has not had a repeat in the past 3 days of that issue. 11-09-2021 upon evaluation today patient presents after having had quite an ordeal over the past several days. Subsequently yesterday I got a call from [facility] here in [place] concerning the patient and the fact that he was having bleeding that been going on for the past 24 hours and was not stopping. Subsequently my advice was to have the patient go to the ER for further evaluation and treatment. It seems to me that he had a regular way part of the wound down to the point that a blood vessel and open. Apparently he was filling up trash  bags wrapped around his foot with blood. In fact his lab review which I did do him as well today showed that he went from a white hemoglobin on 09-14-2021 of 13.6 down to a hemoglobin of 11.2 on 11-08-2021 this was yesterday. Subsequently this is due to the amount of blood loss that he has had acutely. Again I do not believe he is at transfusion stage but at the same time he is extremely weak and states that a couple times when he is going to stand up he is actually fallen back into his chair. Nonetheless I do believe that he probably needs to supplement with iron and I did recommend that there are some over-the-counter products he could get in this regard. Subsequently also suggested that the patient needs to be drinking plenty of water he apparently drinks Coke and he drinks coffee but that is about it. Subsequently following this conversation his wife was present during this time as well and I do think that he is going to try to drink more I think it is of utmost importance. With that being said I did review his PCR culture as well it was positive for 2 organisms. This was Pseudomonas and Enterobacter. Both were showing up as being very prevalent in the PCR culture and subsequently Cipro will take care of the situation which is what I am recommending at this point based on what we see. This will take the place of the Bactrim and can have him discontinue the Bactrim the only thing is he is getting need to stop taking the hydroxyzine if he has been taking it that is the Vistaril and he tells me that he only takes this when he itches he has not been taking it recently. 6/7; patient arrives in clinic today with the wound on the right foot actually looking some better the area on the left lateral looking about the same. Problematically, he is having edema fluid leakage on the medial part of the left foot and ankle causing skin breakdown but no open wound. He has been very resistant to the notion of  compression wraps. He has been using Santyl on both wound areas at home changing the dressing himself Partway through our visit today it became clear he had had a fall on Sunday 3 days ago. Since then he has had a lot of problems with his right shoulder 11-23-2021 upon evaluation today patient appears to be doing well with regard to his wound. Has been tolerating dressing changes without complication. Fortunately there does not appear to be any signs of infection he was wrapped last week when Dr. Dellia Nims saw him on the left. He was having some breakdown medially which they were concerned about.  He did keep the wrap on until Monday but states it got extremely wet. For that reason I am thinking of doing a nurse visit on Friday so that we can get some of the swelling down right now by Friday we should have a lot of that out that we can switch out and put on a fresh dressing to get him through till next Wednesday. He is in agreement with giving that a try. 11-30-2021 upon evaluation today patient actually appears to be doing better with regard to the overall appearance of his wound. Fortunately there does not appear to be any signs of active infection locally or systemically which is great news. With that being said he has been tolerating the dressing changes without complication other than the fact that from Wednesday to Friday he did well with the compression wrap from Friday till yesterday he tells me the drainage was so bad and the smell was so bad he had to take this off. It does appear that he is very macerated around the lower portion down around the heel and I do think that this is an issue. Unfortunately we cannot get home health to help with this currently. 12-07-2021 upon evaluation today patient appears to be doing well currently in regard to his wound. He is actually showing signs of excellent improvement which is great news. I am very pleased with where things stand. I do think that the progress  is being made. With that being said the patient is tolerating the Santyl quite well and I think is doing an awesome job for him. 12-14-2021 upon evaluation today patient appears to be doing well with regard to his wounds he is making progress is still draining a lot however which I completely understand. With that being said fortunately there does not appear to be any signs of active infection locally or systemically at this time. 12-21-2021 upon evaluation today patient appears to be doing well currently in regard to his wound. He is actually showing signs of significant improvement this is slow but nonetheless week by week we are seeing this improve on the lateral aspect of his foot. The measurements are little bit better but more importantly the wound surface is actually a whole lot cleaner. The patient is pleased to hear this. With that being said he tells me that it is really hard for him to see exactly what all is going on. Nonetheless I am very pleased with all the new red tissue that were seeing granulating and hopefully we get this clean shortly and we can even switch to a different dressing to try to get it to close. 12-28-2021 upon evaluation today patient appears to be doing well currently in regard to his wound is actually showing signs of improvement we are going to perform some debridement today to clearway some of the necrotic debris. Overall I think that we are at the point where we do not have to use Santyl over the home wound I think using it just on the upper portion may be appropriate if we can get this completely cleaned away. Objective Constitutional Well-nourished and well-hydrated in no acute distress. Vitals Time Taken: 8:56 AM, Height: 66 in, Weight: 184 lbs, BMI: 29.7, Temperature: 98 F, Pulse: 65 bpm, Respiratory Rate: 18 breaths/min, Blood Pressure: 135/69 mmHg. Respiratory normal breathing without difficulty. Psychiatric this patient is able to make decisions and  demonstrates good insight into disease process. Alert and Oriented x 3. pleasant and cooperative. General Notes: Upon inspection patient's  wound bed actually showed signs of good granulation and epithelization at this point. Fortunately I do not see any evidence of active infection. I did actually perform debridement of clearway some of the necrotic debris and the patient tolerated this today without complication. Fortunately there does not appear to be any signs of active infection locally or systemically at this time. Integumentary (Hair, Skin) Wound #6 status is Open. Original cause of wound was Gradually Appeared. The date acquired was: 07/25/2021. The wound has been in treatment 10 weeks. The wound is located on the Left,Lateral Foot. The wound measures 6cm length x 4.8cm width x 0.2cm depth; 22.619cm^2 area and 4.524cm^3 volume. There is Fat Layer (Subcutaneous Tissue) exposed. There is no tunneling or undermining noted. There is a large amount of serosanguineous drainage noted. The wound margin is distinct with the outline attached to the wound base. There is medium (34-66%) red, pink, hyper - granulation within the wound bed. There is a medium (34-66%) amount of necrotic tissue within the wound bed including Adherent Slough. 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 There was a Excisional Skin/Subcutaneous Tissue Debridement with a total area of 28.8 sq cm performed by [name], PA. With the following instrument(s):  Curette to remove Non-Viable tissue/material. Material removed includes Subcutaneous Tissue and Slough and after achieving pain control using Lidocaine 5% topical ointment. No specimens were taken. A time out was conducted at 09:29, prior to the start of the procedure. A Minimum amount of bleeding was controlled with Pressure. The procedure was tolerated well. Post Debridement Measurements: 6cm length x 4.8cm width x 0.2cm depth; 4.524cm^3 volume. Character of Wound/Ulcer Post Debridement is stable. Post procedure Diagnosis Wound #6: Same as Pre-Procedure Plan Follow-up Appointments: Return Appointment in 1 week. - 01/04/22 @ 9:30am with Bernette Redbird, Room 7) Bathing/ Shower/ Hygiene: May shower and wash wound with soap and water. - wash with antibacterial soap when changing dressing Edema Control - Lymphedema / SCD / Other: Elevate legs to the level of the heart or above for 30 minutes daily and/or when sitting, a frequency of: - throughout the day Patient to wear own compression stockings every day. - Use stocking daily to right leg Moisturize legs daily. - Eucerin (in jar) Additional Orders / Instructions: Follow Nutritious Diet Non Wound Condition: Apply the following to affected area as directed: - Apply ABD pad to left medial leg WOUND #6: - Foot Wound Laterality: Left, Lateral Cleanser: Soap and Water Every Other Day/30 Days Discharge Instructions: May shower and wash wound with dial antibacterial soap and water prior to dressing change. Peri-Wound Care: Zinc Oxide Ointment 30g tube Every Other Day/30 Days Discharge Instructions: Apply Zinc Oxide to periwound with each dressing change Prim Dressing: Hydrofera Blue Ready Foam, 4x5 in (DME) (Generic) Every Other Day/30 Days ary Discharge Instructions: Apply to wound bed as instructed Prim Dressing: Santyl Ointment Every Other Day/30 Days ary Discharge Instructions: Apply to upper portion of wound only Secondary Dressing: ABD Pad, 5x9  (DME) (Generic) Every Other Day/30 Days Discharge Instructions: Apply over primary dressing as directed. Secondary Dressing: Woven Gauze  Sponge, Non-Sterile 4x4 in (DME) (Generic) Every Other Day/30 Days Discharge Instructions: Apply over primary dressing as directed. Secured With: Elastic Bandage 4 inch (ACE bandage) (DME) (Generic) Every Other Day/30 Days Discharge Instructions: Secure with ACE bandage as directed. Secured With: [employer], 4.5x3.1 (in/yd) (DME) (Generic) Every Other Day/30 Days Discharge Instructions: Secure with Kerlix as directed. Secured With: 39M Medipore Public affairs consultant Surgical T 2x10 (in/yd) (DME) (Generic) Every Other Day/30 Days ape Discharge Instructions: Secure with tape as directed. Com pression Wrap: Elastic 4" Bandage (ACE) Every Other Day/30 Days Discharge Instructions: Apply from base of toes to just below knee 1. Would recommend currently that we going to continue with the wound care measures as before and the patient is in agreement with plan this includes the use of the Santyl just on the upper portion of the wound I showed him exactly where to apply that today. 2. I am also can recommend that we continue with the [facility] recommendation which I think is can be good for the remainder of the wound. 3. I am also going to suggest that we have the patient continue with the roll gauze to secure in place along with ABD pads and then subsequently the Ace wrap to help with compression. We have attempted actual 3 layer compression wraps but he was draining much too heavily and tells me that it was driving him crazy having on all the time. He seems to be doing decently well with the Ace wraps. We will see patient back for reevaluation in 1 week here in the clinic. If anything worsens or changes patient will contact our office for additional recommendations. Electronic Signature(s) Signed: 12/28/2021 9:47:11 AM By: [name] PA-C Entered By: [name] on 12/28/2021 09:47:11 -------------------------------------------------------------------------------- SuperBill Details Patient Name: Date of Service: [place], [name] 12/28/2021 Medical Record Number: [id number] Patient Account Number: [id number] Date of Birth/Sex: Treating RN: 1941/03/03 (81 y.o. 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 layer exposed E11.622 Type 2 diabetes mellitus with other skin ulcer Facility Procedures CPT4 Code: [id number] Description: 62130 - DEB SUBQ TISSUE 20 SQ CM/< ICD-10 Diagnosis Description L97.522 Non-pressure chronic ulcer of other part of left foot with fat layer exposed Modifier: Quantity: 1 CPT4 Code: [id number] Description: 62952 - DEB SUBQ TISS EA ADDL 20CM ICD-10 Diagnosis Description L97.522 Non-pressure chronic ulcer of other part of left foot with fat layer exposed Modifier: Quantity: 1 Physician Procedures : CPT4 Code Description Modifier [id number] 01027 - WC PHYS SUBQ TISS 20 SQ CM ICD-10 Diagnosis Description L97.522 Non-pressure chronic ulcer of other part of left foot with fat layer exposed Quantity: 1 : [id number] 03474 - WC PHYS SUBQ TISS EA ADDL 20 CM ICD-10 Diagnosis Description L97.522 Non-pressure chronic ulcer of other part of left foot with fat layer exposed Quantity: 1 Electronic Signature(s) Signed: 12/28/2021 9:47:46 AM By: [name] PA-C Entered By: [name] on 12/28/2021 09:47:46

## 2022-01-04 ENCOUNTER — Encounter (HOSPITAL_BASED_OUTPATIENT_CLINIC_OR_DEPARTMENT_OTHER): Payer: Medicare HMO | Admitting: Internal Medicine

## 2022-01-04 DIAGNOSIS — I87333 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 extremity: Secondary | ICD-10-CM | POA: Diagnosis not present

## 2022-01-04 DIAGNOSIS — L97512 Non-pressure chronic ulcer of other part of right foot with fat layer exposed: Secondary | ICD-10-CM | POA: Diagnosis not present

## 2022-01-04 DIAGNOSIS — L97522 Non-pressure chronic ulcer of other part of left foot with fat layer exposed: Secondary | ICD-10-CM | POA: Diagnosis not present

## 2022-01-04 DIAGNOSIS — E11622 Type 2 diabetes mellitus with other skin ulcer: Secondary | ICD-10-CM | POA: Diagnosis not present

## 2022-01-04 DIAGNOSIS — L958 Other vasculitis limited to the skin: Secondary | ICD-10-CM | POA: Diagnosis not present

## 2022-01-04 DIAGNOSIS — I890 Lymphedema, not elsewhere classified: Secondary | ICD-10-CM | POA: Diagnosis not present

## 2022-01-04 NOTE — Progress Notes (Signed)
BRENNEN, CAMPER ([id number]) Visit Report for 01/04/2022 Arrival Information Details Patient Name: Date of Service: [place], [name] 01/04/2022 9:30 A M Medical Record Number: [id number] Patient Account Number: [id number] Date of Birth/Sex: Treating RN: 1941/02/03 (81 y.o. [name] Primary Care Imanni Burdine: [name] Other Clinician: Referring Jenniger Figiel: Treating Judine Arciniega/Extender: Glean Hess in Treatment: 11 Visit Information History Since Last Visit Added or deleted any medications: No Patient Arrived: Ambulatory Any new allergies or adverse reactions: No Arrival Time: 09:26 Had a fall or experienced change in No Accompanied By: wife activities of daily living that may affect Transfer Assistance: None risk of falls: Patient Identification Verified: Yes Signs or symptoms of abuse/neglect since last visito No Secondary Verification Process Completed: Yes Hospitalized since last visit: No Patient Requires Transmission-Based Precautions: No Implantable device outside of the clinic excluding No Patient Has Alerts: No cellular tissue based products placed in the center since last visit: Has Dressing in Place as Prescribed: Yes Has Compression in Place as Prescribed: Yes Pain Present Now: Yes Electronic Signature(s) Signed: 01/04/2022 4:44:39 PM By: [name] Entered By: [name] on 01/04/2022 09:27:05 -------------------------------------------------------------------------------- Encounter Discharge Information Details Patient Name: Date of Service: [name], [name] 01/04/2022 9:30 A M Medical Record Number: [id number] Patient Account Number: [id number] Date of Birth/Sex: Treating RN: 03-05-1941 (81 y.o. [name] Primary Care Grayce Budden: [name] Other Clinician: Referring Weslie Pretlow: Treating Carliss Porcaro/Extender: Glean Hess in Treatment: 11 Encounter Discharge Information  Items Post Procedure Vitals Discharge Condition: Stable Temperature (F): 97.9 Ambulatory Status: Ambulatory Pulse (bpm): 67 Discharge Destination: Home Respiratory Rate (breaths/min): 18 Transportation: Private Auto Blood Pressure (mmHg): 120/76 Schedule Follow-up Appointment: Yes Clinical Summary of Care: Provided on 01/04/2022 Form Type Recipient Paper Patient Patient Electronic Signature(s) Signed: 01/04/2022 4:44:39 PM By: [name] Entered By: [name] on 01/04/2022 10:21:08 -------------------------------------------------------------------------------- Lower Extremity Assessment Details Patient Name: Date of Service: [place], [name] 01/04/2022 9:30 A M Medical Record Number: [id number] Patient Account Number: [id number] Date of Birth/Sex: Treating RN: [birth year] (81 y.o. [name] Primary Care Matina Rodier: [name] Other Clinician: Referring Lasheena Frieze: Treating Galilea Quito/Extender: Glean Hess in Treatment: 11 Edema Assessment Assessed: Shirlyn Goltz: Yes] Patrice Paradise: No] Edema: [Left: Ye] [Right: s] Calf Left: Right: Point of Measurement: 32 cm From Medial Instep 38.7 cm Ankle Left: Right: Point of Measurement: 10 cm From Medial Instep 24.3 cm Vascular Assessment Pulses: Dorsalis Pedis Palpable: [Left:Yes] Electronic Signature(s) Signed: 01/04/2022 4:44:39 PM By: [name] Entered By: [name] on 01/04/2022 09:42:09 -------------------------------------------------------------------------------- Multi Wound Chart Details Patient Name: Date of Service: [name], [name] 01/04/2022 9:30 A M Medical Record Number: [id number] Patient Account Number: [id number] Date of Birth/Sex: Treating RN: [birth year] (81 y.o. [name] Primary Care Mikell Camp: [name] Other Clinician: Referring Myrl Bynum: Treating Paulita Licklider/Extender: Glean Hess in Treatment: 11 Vital  Signs Height(in): 79 Pulse(bpm): 63 Weight(lbs): 184 Blood Pressure(mmHg): 120/76 Body Mass Index(BMI): 29.7 Temperature(F): 97.9 Respiratory Rate(breaths/min): 18 Photos: [6:Left, Lateral Foot] [N/A:N/A N/A] Wound Location: [6:Gradually Appeared] [N/A:N/A] Wounding Event: [6:Vasculitis] [N/A:N/A] Primary Etiology: [6:Anemia, Chronic Obstructive] [N/A:N/A] Comorbid History: [6:Pulmonary Disease (COPD), Hypertension, Peripheral Venous Disease, Type II Diabetes, Osteoarthritis, Neuropathy 07/25/2021] [N/A:N/A] Date Acquired: [6:11] [N/A:N/A] Weeks of Treatment: [6:Open] [N/A:N/A] Wound Status: [6:No] [N/A:N/A] Wound Recurrence: [6:5.8x4.8x0.2] [N/A:N/A] Measurements L x W x D (cm) [6:21.865] [N/A:N/A] A (cm) : rea [6:4.373] [N/A:N/A] Volume (cm) : [6:-12.30%] [N/A:N/A] %  Reduction in A [6:rea: -12.20%] [N/A:N/A] % Reduction in Volume: [6:Full Thickness Without Exposed] [N/A:N/A] Classification: [6:Support Structures Large] [N/A:N/A] Exudate A mount: [6:Serosanguineous] [N/A:N/A] Exudate Type: [6:red, brown] [N/A:N/A] Exudate Color: [6:Distinct, outline attached] [N/A:N/A] Wound Margin: [6:Large (67-100%)] [N/A:N/A] Granulation A mount: [6:Red, Pink, Hyper-granulation] [N/A:N/A] Granulation Quality: [6:Small (1-33%)] [N/A:N/A] Necrotic A mount: [6:Fat Layer (Subcutaneous Tissue): Yes N/A] Exposed Structures: [6:Fascia: No Tendon: No Muscle: No Joint: No Bone: No None] [N/A:N/A] Epithelialization: [6:Chemical/Enzymatic/Mechanical] [N/A:N/A] Debridement: Pre-procedure Verification/Time Out 09:58 [N/A:N/A] Taken: [6:N/A] [N/A:N/A] Instrument: [6:None] [N/A:N/A] Bleeding: [6:Procedure was tolerated well] [N/A:N/A] Debridement Treatment Response: [6:5.8x4.8x0.2] [N/A:N/A] Post Debridement Measurements L x W x D (cm) [6:4.373] [N/A:N/A] Post Debridement Volume: (cm) [6:Debridement] [N/A:N/A] Treatment Notes Electronic Signature(s) Signed: 01/04/2022 4:38:07 PM By: Linton Ham MD Signed: 01/04/2022 4:44:39 PM By: [name] Entered By: Linton Ham on 01/04/2022 10:13:56 -------------------------------------------------------------------------------- Multi-Disciplinary Care Plan Details Patient Name: Date of Service: [place], [name] 01/04/2022 9:30 A M Medical Record Number: [id number] Patient Account Number: [id number] Date of Birth/Sex: Treating RN: 1941-04-11 (81 y.o. [name] Primary Care Sanjuanita Condrey: [name] Other Clinician: Referring Saturnino Liew: Treating Ernisha Sorn/Extender: Glean Hess in Treatment: 11 Active Inactive Venous Leg Ulcer Nursing Diagnoses: Actual venous Insuffiency (use after diagnosis is confirmed) Goals: Patient will maintain optimal edema control Date Initiated: 10/19/2021 Target Resolution Date: 02/08/2022 Goal Status: Active Interventions: Assess peripheral edema status every visit. Compression as ordered Notes: 11/16/21: Edema not controlled, patient not compliant with compression. 12/14/21:Edema not well controlled, patient will not allow compression wraps. Wound/Skin Impairment Nursing Diagnoses: Impaired tissue integrity Goals: Patient/caregiver will verbalize understanding of skin care regimen Date Initiated: 10/19/2021 Target Resolution Date: 02/08/2022 Goal Status: Active Ulcer/skin breakdown will have a volume reduction of 30% by week 4 Date Initiated: 10/19/2021 Date Inactivated: 12/14/2021 Target Resolution Date: 12/14/2021 Unmet Reason: infection, non Goal Status: Unmet compliance Interventions: Assess patient/caregiver ability to obtain necessary supplies Assess patient/caregiver ability to perform ulcer/skin care regimen upon admission and as needed Assess ulceration(s) every visit Provide education on ulcer and skin care Treatment Activities: Topical wound management initiated : 10/19/2021 Notes: 11/16/21: Wound care continues, patient not compliant with  edema control. Electronic Signature(s) Signed: 01/04/2022 4:44:39 PM By: [name] Entered By: [name] on 01/04/2022 09:26:22 -------------------------------------------------------------------------------- Pain Assessment Details Patient Name: Date of Service: [name], [name] 01/04/2022 9:30 A M Medical Record Number: [id number] Patient Account Number: [id number] Date of Birth/Sex: Treating RN: Apr 07, 1941 (81 y.o. [name] Primary Care Angeligue Bowne: [name] Other Clinician: Referring Huxton Glaus: Treating Preet Perrier/Extender: Glean Hess in Treatment: 11 Active Problems Location of Pain Severity and Description of Pain Patient Has Paino Yes Site Locations Pain Location: Pain Location: Pain in Ulcers With Dressing Change: Yes Duration of the Pain. Constant / Intermittento Intermittent Rate the pain. Current Pain Level: 3 Character of Pain Describe the Pain: Burning, Tender, Throbbing Pain Management and Medication Current Pain Management: Medication: Yes Cold Application: No Rest: Yes Massage: No Activity: No T.E.N.S.: No Heat Application: No Leg drop or elevation: No Is the Current Pain Management Adequate: Adequate How does your wound impact your activities of daily livingo Sleep: No Bathing: No Appetite: No Relationship With Others: No Bladder Continence: No Emotions: No Bowel Continence: No Work: No Toileting: No Drive: No Dressing: No Hobbies: No Electronic Signature(s) Signed: 01/04/2022 4:44:39 PM By: [name] Entered By: [name] on 01/04/2022 09:27:37 -------------------------------------------------------------------------------- Patient/Caregiver Education Details Patient Name: Date of Service: [place]  [name] 7/26/2023andnbsp9:30 A M Medical Record Number: [id number] Patient Account Number: [id number] Date of Birth/Gender: Treating RN: November 12, 1940 (81 y.o. [name] Primary Care Physician: [name] Other Clinician: Referring Physician: Treating Physician/Extender: Glean Hess in Treatment: 11 Education Assessment Education Provided To: Patient Education Topics Provided Venous: Methods: Explain/Verbal, Printed Responses: State content correctly Wound/Skin Impairment: Methods: Explain/Verbal, Printed Responses: State content correctly Electronic Signature(s) Signed: 01/04/2022 4:44:39 PM By: [name] Entered By: [name] on 01/04/2022 09:26:43 -------------------------------------------------------------------------------- Wound Assessment Details Patient Name: Date of Service: [name], [place] [name] 01/04/2022 9:30 A M Medical Record Number: [id number] Patient Account Number: [id number] Date of Birth/Sex: Treating RN: July 29, 1940 (81 y.o. [name] Primary Care Wilman Tucker: [name] Other Clinician: Referring Sanaa Zilberman: Treating Rayven Rettig/Extender: Glean Hess in Treatment: 11 Wound Status Wound Number: 6 Primary Vasculitis Etiology: Wound Location: Left, Lateral Foot Wound Open Wounding Event: Gradually Appeared Status: Date Acquired: 07/25/2021 Comorbid Anemia, Chronic Obstructive Pulmonary Disease (COPD), Weeks Of Treatment: 11 History: Hypertension, Peripheral Venous Disease, Type II Diabetes, Clustered Wound: No Osteoarthritis, Neuropathy Photos Wound Measurements Length: (cm) 5.8 Width: (cm) 4.8 Depth: (cm) 0.2 Area: (cm) 21.865 Volume: (cm) 4.373 % Reduction in Area: -12.3% % Reduction in Volume: -12.2% Epithelialization: None Tunneling: No Undermining: No Wound Description Classification: Full Thickness Without Exposed Support Structures Wound Margin: Distinct, outline attached Exudate Amount: Large Exudate Type: Serosanguineous Exudate Color: red, brown Foul Odor After Cleansing: No Slough/Fibrino Yes Wound  Bed Granulation Amount: Large (67-100%) Exposed Structure Granulation Quality: Red, Pink, Hyper-granulation Fascia Exposed: No Necrotic Amount: Small (1-33%) Fat Layer (Subcutaneous Tissue) Exposed: Yes Necrotic Quality: Adherent Slough Tendon Exposed: No Muscle Exposed: No Joint Exposed: No Bone Exposed: No Treatment Notes Wound #6 (Foot) Wound Laterality: Left, Lateral Cleanser Soap and Water Discharge Instruction: May shower and wash wound with dial antibacterial soap and water prior to dressing change. Peri-Wound Care Zinc Oxide Ointment 30g tube Discharge Instruction: Apply Zinc Oxide to periwound with each dressing change Topical Primary Dressing Hydrofera Blue Ready Foam, 4x5 in Discharge Instruction: Apply to wound bed as instructed Santyl Ointment Discharge Instruction: Apply to upper portion of wound only Secondary Dressing ABD Pad, 5x9 Discharge Instruction: Apply over primary dressing as directed. Woven Gauze Sponge, Non-Sterile 4x4 in Discharge Instruction: Apply over primary dressing as directed. Secured With Elastic Bandage 4 inch (ACE bandage) Discharge Instruction: Secure with ACE bandage as directed. Kerlix Roll Sterile, 4.5x3.1 (in/yd) Discharge Instruction: Secure with Kerlix as directed. 46M Medipore Soft Cloth Surgical T 2x10 (in/yd) ape Discharge Instruction: Secure with tape as directed. Compression Wrap Elastic 4" Bandage (ACE) Discharge Instruction: Apply from base of toes to just below knee Compression Stockings Add-Ons Electronic Signature(s) Signed: 01/04/2022 4:44:39 PM By: [name] Entered By: [name] on 01/04/2022 09:40:47 -------------------------------------------------------------------------------- [place] Details Patient Name: Date of Service: [name], [place] [name] 01/04/2022 9:30 A M Medical Record Number: [id number] Patient Account Number: [id number] Date of Birth/Sex: Treating RN: 02-Aug-1940 (81 y.o. [name] Primary Care Harve Spradley: [name] Other Clinician: Referring Willer Osorno: Treating Ilir Mahrt/Extender: Glean Hess in Treatment: 11 Vital Signs Time Taken: 09:33 Temperature (F): 97.9 Height (in): 66 Pulse (bpm): 67 Weight (lbs): 184 Respiratory Rate (breaths/min): 18 Body Mass Index (BMI): 29.7 Blood Pressure (mmHg): 120/76 Reference Range: 80 - 120 mg / dl Electronic Signature(s) Signed: 01/04/2022 4:44:39 PM By: [name] Entered By: [name] on 01/04/2022 09:33:42

## 2022-01-04 NOTE — Progress Notes (Signed)
LAWSEN, ARNOTT ([id number]) Visit Report for 01/04/2022 Debridement Details Patient Name: Date of Service: [place], [name] 01/04/2022 9:30 A M Medical Record Number: [id number] Patient Account Number: [id number] Date of Birth/Sex: Treating RN: 12/28/1940 (81 y.o. [name] Primary Care Provider: [name] Other Clinician: Referring Provider: Treating Provider/Extender: Glean Hess in Treatment: 11 Debridement Performed for Assessment: Wound #6 Left,Lateral Foot Performed By: Physician Ricard Dillon., MD Debridement Type: Chemical/Enzymatic/Mechanical Agent Used: Santyl Level of Consciousness (Pre-procedure): Awake and Alert Pre-procedure Verification/Time Out Yes - 09:58 Taken: Start Time: 09:59 Bleeding: None End Time: 10:03 Response to Treatment: Procedure was tolerated well Level of Consciousness (Post- Awake and Alert procedure): Post Debridement Measurements of Total Wound Length: (cm) 5.8 Width: (cm) 4.8 Depth: (cm) 0.2 Volume: (cm) 4.373 Character of Wound/Ulcer Post Debridement: Stable Post Procedure Diagnosis Same as Pre-procedure Electronic Signature(s) Signed: 01/04/2022 4:38:07 PM By: Linton Ham MD Signed: 01/04/2022 4:44:39 PM By: [name] Entered By: [name] on 01/04/2022 10:03:22 -------------------------------------------------------------------------------- HPI Details Patient Name: Date of Service: [name], RO [name] 01/04/2022 9:30 A M Medical Record Number: [id number] Patient Account Number: [id number] Date of Birth/Sex: Treating RN: September 09, 1940 (81 y.o. [name] Primary Care Provider: [name] Other Clinician: Referring Provider: Treating Provider/Extender: Glean Hess in Treatment: 11 History of Present Illness HPI Description: 81 year old gentleman known to have swelling of his left lower extremity with some ulceration along the  medial ankle has been having these problems for the last 3-4 months and does not know how it came on. No history of injury or no history of any infection in this area. He is known to have diabetes mellitus controlled with diet, hyperlipidemia, hypertension and chronic lumbar back pain with sciatica which is being treated by his PCP. Last hemoglobin A1c was 6.3 in June 2017. Last medical history is significant for esophageal stricture, hiatal hernia, vitamin D deficiency, status post back surgery 3, hernia repair as a child for both groins, hiatal hernia repair, joint replacement and revision of a knee surgery on the right side. He has quit smoking in 1982. He has never had a Doppler study of his lower extremity except remotely when he had knee surgery they looked for a blood clot on the right side. 09/13/2016 -- venous reflux study done on 09/12/2016 showed there is evidence of greater saphenous vein reflux in the left lower extremity and the small saphenous vein and the posterior thigh is not competent. 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 With these results he definitely needs a referral to the vascular surgeons 09/20/2016 -- he has an appointment to see the vascular surgeons on May 1 10/03/2016 -- patient had a 4 layer compression on his left lower extremity and had a lot of discomfort and pain. 10/11/2016-- was seen by Dr. Althea [name] -- review of his data he recommended staged laser ablation of his great and then small saphenous veins for reduction odd of his venous hypertension. He did examine his right leg with the SonoSite and he has dilatation of the saphenous vein on the right lower extremity too. Since there was no evidence of ulceration he recommended observation of the right lower extremity. He recommended to continue with local wound care at the wound center until his wound was completely healed. 10/18/2016 -- the patient has not been very compliant with his  compression, his diet and his diuretics and as a result  his lymphedema has increased markedly 10/25/2016 -- he has been compliant this week, has worn his 2 press compression wraps, taken his diuretics and is watching his salt intake. 11/08/2016 -- he had his venous procedure done last week and details of this have been reported and reviewed in his electronic medical record. he had a laser ablation of his great saphenous vein and this was done from mid calf to just below the saphenofemoral junction. He would return next week for ultrasound follow-up. He will then undergo the small saphenous vein ablation in a few weeks. 11/15/2016 -- his postprocedure visit showed good closure of his left great saphenous vein from the mid calf to 1-1/2 cm from the saphenofemoral junction. He had excellent early results from the ablation. The ablation of the left small saphenous vein was planned in a week 12/06/2016 -- he had his small saphenous vein venous ablation a week before and the duplex showed closure of his small saphenous vein to within half centimeter office saphenous popliteal junction with no DVT and he was pleased with the results. He recommended continue with elevation and compression and to see them back on an as-needed basis 12/19/16; the patient's wound is actually closed. Sometime over the weekend he developed a small abrasion on the lower calf just above the original wound on the left medial malleolus although this is closed as well Readmission: 01/18/18 on evaluation today patient presents after having last been seen in our clinic July 2018. Subsequently he is a reoccurrence of the ulcers on the left ankle both medial and now a new area lateral that had been present back room for about a month he tells me. He states that this has done very well for about a year he really does not know of any injury or anything that happened that would have caused this reopening at this point. He has continued to  wear compression stockings which is appropriate. He does not have lymphedema pumps. He has continued to also keep the area clean and dry as best he could. With that being said now that is been reopened is been harder for him to take care of this as far as compression stockings are concerned keeping them clean along with continuing to manage his fluid and swelling. His ABI appears to be great on the left registering at 1.14 today. He has previously undergone a venous ablation. Other than compression/lymphedema pumps he's really done everything that he can to help manage and prevent these ulcers from forming. 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 01/30/18 on evaluation today patient appears to actually be doing very well in regard to his medial ankle ulcer. It's the lateral ulcer that actually is not doing quite as well today. Fortunately he did have some alginate left ovary start using this on the wounds and the medial ankle actually seems to be doing much better. The lateral ankle does actually need something to con a help with slough and buildup. The collagen really did not seem to do much for him in that regard. 02/13/18 on evaluation today patient appears to be doing about the same in regard to his ulcers. Unfortunately I do not feel like were making good progress at this time. When I have debrided the wound the slough seems to come back fairly quickly in the lateral ankle ulcer. There does not appear to be any evidence of infection at this time. No fevers, chills, nausea, or vomiting noted at this time. 02/27/18 on evaluation today patient actually appears  to be showing some signs of improvement in regard to both wound areas. I'm insurance on the medial aspect of his left lower extremity at the ankle whether or not the alginate may be sticking and causing some tearing off of new skin attempting to grow. With that being said the patient states it does not seem to be doing such just pulls up some of the dried  dead skin around. Nonetheless this is something I wanted to watch out for going forward and actually I recommended that he take the dressing off in the shower when you could get it completely wet before removal to see how this does. Subsequently in regard to the lateral ankle ulcer he still has slough noted at this point although I do believe that he is tolerating the Medihoney much better I wish you could have gotten the Santyl but unfortunately the Santyl was too expensive gonna cost him $250. 03/06/18 on evaluation today patient appears to be doing about the same in regard to both ulcers of his left medial and lateral malleolus sites. He's been tolerating the dressing changes without complication. Unfortunately things do not seem to be improving in regard to either side at this time. Overall I think we may need to switch things up a little bit as far as the way we are performing the dressing changes currently. 03/27/18 on evaluation today patient's wound bed actually appears to be doing much better at both locations in regard to his ankle ulcers. He has been tolerating the dressing changes at this time without complication. Overall I'm very pleased with how things appear. The patient likewise states that he's much better in regard to discomfort at this time. 04/10/18 on evaluation today patient actually appears to be doing well in regard to his left lateral ankle ulcer. He has been tolerating the dressing changes without complication. Fortunately there does not appear to be any evidence of infection. Overall I do feel like he is tolerating the Medihoney without complication. 04/24/18 upon evaluation today patient's wound actually appears to be healed on the lateral aspect of his ankle the medial aspect still continues to remain close without any evidence of complication at this time. Overall I have been extremely pleased with how things have progressed up to this point. The patient likewise is very  happy he's not having any significant pain this time. He does wears compression stocking on a regular basis. Readmission: 10-19-2021 upon evaluation today patient presents for evaluation here in the clinic concerning issues with a left lateral ulcer as well as a right lateral ulcer both along the aspect of his foot getting close to the ankle. This left area is the same place that I treated back in 2024. 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 This appears to be very inflamed and is also very painful. In the past he has been completely unable to tolerate the use of any compression wraps unfortunately. There does not appear to be any evidence of active infection locally nor systemically at this time which is good news. I do believe however we need to try to see what we can do to calm down the inflammation I think triamcinolone topically as well as oral prednisone would likely be indicated in this case. Patient's medical history really has not changed significantly since I last saw him in actually 2019 though I think is stated 2020 above. 10-26-21 upon evaluation today patient appears to still be having  a lot of irritation and inflammation in regard to the ankles laterally. The left is greater than the right. With that being said I do believe that he would benefit from a biopsy to confirm whether or not this may indeed be vasculitis. It actually seems like that physically and on examination but again I want to be sure that we are on the right track as far as treatment is concerned. The good news is he did not have a DVT I called him about that on Friday this was all in regard to the right leg. Overall I am very pleased in that regard. 11-02-2021 upon evaluation today patient appears to be doing poorly still in regard to his left lower extremity. Apparently he had a fever on Sunday which was around 101 and he was severely sick feeling and disoriented according to  his wife. She came with him today because she did not think that he would actually tell me what was going on. With that being said unfortunately he did not go to the hospital he actually has been doing a little better over the past few days he been taking amoxicillin he has not taken any other medications orally at this point that are different. He has not been on any antibiotics either more recently other than the amoxicillin. Nonetheless he has not had a repeat in the past 3 days of that issue. 11-09-2021 upon evaluation today patient presents after having had quite an ordeal over the past several days. Subsequently yesterday I got a call from [place] here in [place] concerning the patient and the fact that he was having bleeding that been going on for the past 24 hours and was not stopping. Subsequently my advice was to have the patient go to the ER for further evaluation and treatment. It seems to me that he had a regular way part of the wound down to the point that a blood vessel and open. Apparently he was filling up trash bags wrapped around his foot with blood. In fact his lab review which I did do him as well today showed that he went from a white hemoglobin on 09-14-2021 of 13.6 down to a hemoglobin of 11.2 on 11-08-2021 this was yesterday. Subsequently this is due to the amount of blood loss that he has had acutely. Again I do not believe he is at transfusion stage but at the same time he is extremely weak and states that a couple times when he is going to stand up he is actually fallen back into his chair. Nonetheless I do believe that he probably needs to supplement with iron and I did recommend that there are some over-the-counter products he could get in this regard. Subsequently also suggested that the patient needs to be drinking plenty of water he apparently drinks Coke and he drinks coffee but that is about it. Subsequently following this conversation his wife was present during this time  as well and I do think that he is going to try to drink more I think it is of utmost importance. With that being said I did review his PCR culture as well it was positive for 2 organisms. This was Pseudomonas and Enterobacter. Both were showing up as being very prevalent in the PCR culture and subsequently Cipro will take care of the situation which is what I am recommending at this point based on what we see. This will take the place of the Bactrim and can have him discontinue the Bactrim the only  thing is he is getting need to stop taking the hydroxyzine if he has been taking it that is the Vistaril and he tells me that he only takes this when he itches he has not been taking it recently. 6/7; patient arrives in clinic today with the wound on the right foot actually looking some better the area on the left lateral looking about the same. Problematically, he is having edema fluid leakage on the medial part of the left foot and ankle causing skin breakdown but no open wound. He has been very resistant to the notion of compression wraps. He has been using Santyl on both wound areas at home changing the dressing himself Partway through our visit today it became clear he had had a fall on Sunday 3 days ago. Since then he has had a lot of problems with his right shoulder 11-23-2021 upon evaluation today patient appears to be doing well with regard to his wound. Has been tolerating dressing changes without complication. Fortunately there does not appear to be any signs of infection he was wrapped last week when Dr. Dellia Nims saw him on the left. He was having some breakdown medially which they were concerned about. He did keep the wrap on until Monday but states it got extremely wet. For that reason I am thinking of doing a nurse visit on Friday so that we can get some of the swelling down right now by Friday we should have a lot of that out that we can switch out and put on a fresh dressing to get him through  till next Wednesday. He is in agreement with giving that a try. 11-30-2021 upon evaluation today patient actually appears to be doing better with regard to the overall appearance of his wound. Fortunately there does not appear to be any signs of active infection locally or systemically which is great news. With that being said he has been tolerating the dressing changes without complication other than the fact that from Wednesday to Friday he did well with the compression wrap from Friday till yesterday he tells me the drainage was so bad and the smell was so bad he had to take this off. It does appear that he is very macerated around the lower portion down around the heel and I do think that this is an issue. Unfortunately we cannot get home health to help with this currently. 12-07-2021 upon evaluation today patient appears to be doing well currently in regard to his wound. He is actually showing signs of excellent improvement which is great news. I am very pleased with where things stand. I do think that the progress is being made. With that being said the patient is tolerating the Santyl quite well and I think is doing an awesome job for him. 12-14-2021 upon evaluation today patient appears to be doing well with regard to his wounds he is making progress is still draining a lot however which I completely understand. With that being said fortunately there does not appear to be any signs of active infection locally or systemically at this time. 12-21-2021 upon evaluation today patient appears to be doing well currently in regard to his wound. He is actually showing signs of significant improvement this is slow but nonetheless week by week we are seeing this improve on the lateral aspect of his foot. The measurements are little bit better but more importantly the wound surface is actually a whole lot cleaner. The patient is pleased to hear this. With that being  said he tells me that it is really hard for  him to see exactly what all is going on. Nonetheless I am very pleased with all the new red tissue that were seeing granulating and hopefully we get this clean shortly and we can even switch to a different dressing to try to get it to close. 12-28-2021 upon evaluation today patient appears to be doing well currently in regard to his wound is actually showing signs of improvement we are going to perform some debridement today to clearway some of the necrotic debris. Overall I think that we are at the point where we do not have to use Santyl over the home wound I think using it just on the upper portion may be appropriate if we can get this completely cleaned away. 7/6; substantial wound on the left lateral foot. Using Santyl and Hydrofera Blue there is been good improvement since the last time I saw this. The patient does not tolerate compression wraps therefore he is using Ace wraps. Fortunately he appears to be doing this fairly well Electronic Signature(s) Signed: 01/04/2022 4:38:07 PM By: Linton Ham MD Entered By: Linton Ham on 01/04/2022 10:15:06 -------------------------------------------------------------------------------- Physical Exam Details Patient Name: Date of Service: [name], [place] [name] 01/04/2022 9:30 A M Medical Record Number: [id number] Patient Account Number: [id number] Date of Birth/Sex: Treating RN: February 03, 1941 (81 y.o. [name] Primary Care Provider: [name] Other Clinician: Referring Provider: Treating Provider/Extender: Glean Hess in Treatment: 11 Constitutional Sitting or standing Blood Pressure is within target range for patient.. Pulse regular and within target range for patient.Marland Kitchen Respirations regular, non-labored and within target range.. Temperature is normal and within the target range for the patient.Marland Kitchen Appears in no distress. Cardiovascular Pedal pulses are palpable on the left. Notes Wound exam; the  wound is a lot better and measuring smaller. Still some surface slough but I did not think mechanical debridement was necessary. This is also made quite a bit of improvement. No evidence of infection his edema control is moderate but as long as this continues to improve the current compression is adequate. Electronic Signature(s) Signed: 01/04/2022 4:38:07 PM By: Linton Ham MD Entered By: Linton Ham on 01/04/2022 10:16:28 -------------------------------------------------------------------------------- Physician Orders Details Patient Name: Date of Service: [place], [name] 01/04/2022 9:30 A M Medical Record Number: [id number] Patient Account Number: [id number] Date of Birth/Sex: Treating RN: [birth year] (81 y.o. 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 - 01/11/22 @ 9:30am with Bernette Redbird, Room 7) Return A ppointment in 2 weeks. - 01/18/22 @ 9:30AM Return A Bathing/ Shower/ Hygiene May shower and wash wound with soap and water. - wash with antibacterial soap when changing dressing Edema Control - Lymphedema / SCD / Other Elevate legs to the level of the heart or above for 30 minutes daily and/or when sitting, a frequency of: - throughout the day Patient to wear own compression stockings every day. -  Use  stocking daily to right leg Moisturize legs daily. - Eucerin (in jar) Additional Orders / Instructions Follow Nutritious Diet Non Wound Condition pply the following to affected area as directed: - Apply ABD pad to left medial leg A Wound Treatment Wound #6 - Foot Wound Laterality: Left, Lateral Cleanser: Soap and Water Every Other Day/30 Days Discharge Instructions: May shower and wash wound with dial antibacterial soap and water prior to dressing change. Peri-Wound Care: Zinc Oxide Ointment 30g tube Every Other Day/30 Days Discharge Instructions: Apply Zinc Oxide to periwound with each dressing change Prim Dressing: Hydrofera Blue Ready Foam, 4x5 in (Generic) Every Other Day/30 Days ary Discharge Instructions: Apply to wound bed as instructed Prim Dressing: Santyl Ointment Every Other Day/30 Days ary Discharge Instructions: Apply to upper portion of wound only Secondary Dressing: ABD Pad, 5x9 (Generic) Every Other Day/30 Days Discharge Instructions: Apply over primary dressing as directed. Secondary Dressing: Woven Gauze Sponge, Non-Sterile 4x4 in (Generic) Every Other Day/30 Days Discharge Instructions: Apply over primary dressing as directed. Secured With: Elastic Bandage 4 inch (ACE bandage) (Generic) Every Other Day/30 Days Discharge Instructions: Secure with ACE bandage as directed. Secured With: [employer], 4.5x3.1 (in/yd) (Generic) Every Other Day/30 Days Discharge Instructions: Secure with Kerlix as directed. Secured With: 16M Medipore Public affairs consultant Surgical T 2x10 (in/yd) (Generic) Every Other Day/30 Days ape Discharge Instructions: Secure with tape as directed. Compression Wrap: Elastic 4" Bandage (ACE) Every Other Day/30 Days Discharge Instructions: Apply from base of toes to just below knee Electronic Signature(s) Signed: 01/04/2022 4:38:07 PM By: Linton Ham MD Signed: 01/04/2022 4:44:39 PM By: [name] Entered By: [name] on 01/04/2022  10:04:52 -------------------------------------------------------------------------------- Problem List Details Patient Name: Date of Service: [place], [name] 01/04/2022 9:30 A M Medical Record Number: [id number] Patient Account Number: [id number] Date of Birth/Sex: Treating RN: [birth year] (81 y.o. 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 10/19/2021 No Yes L97.522 Non-pressure chronic ulcer of other part of left foot with fat layer exposed 10/19/2021 No Yes L97.512 Non-pressure chronic ulcer of other part of right foot with fat layer exposed 10/19/2021 No Yes E11.622 Type 2 diabetes mellitus with other skin ulcer 10/19/2021 No Yes Inactive Problems Resolved Problems Electronic Signature(s) Signed: 01/04/2022 4:38:07 PM By: Linton Ham MD Entered By: Linton Ham on 01/04/2022 10:13:50 -------------------------------------------------------------------------------- Progress Note Details Patient Name: Date of Service: [name], RO [name] 01/04/2022 9:30 A M Medical Record Number: [id number] Patient Account Number: [id number] Date of Birth/Sex: Treating RN: 1941-01-11 (81 y.o. [name] Primary Care Provider: [name] Other Clinician: Referring Provider: Treating Provider/Extender: Glean Hess in Treatment: 11 Subjective History of Present Illness (HPI) 81 year old gentleman known to have swelling of his left lower extremity with some ulceration along the medial ankle has been having these  problems for the last 3-4 months and does not know how it came on. No history of injury or no history of any infection in this area. He is known to have diabetes mellitus controlled with diet, hyperlipidemia, hypertension and chronic lumbar back pain with sciatica which is being treated by his PCP. Last hemoglobin A1c was 6.3  in June 2017. Last medical history is significant for esophageal stricture, hiatal hernia, vitamin D deficiency, status post back surgery o3, hernia repair as a child for both groins, hiatal hernia repair, joint replacement and revision of a knee surgery on the right side. He has quit smoking in 1982. He has never had a Doppler study of his lower extremity except remotely when he had knee surgery they looked for a blood clot on the right side. 09/13/2016 -- venous reflux study done on 09/12/2016 showed there is evidence of greater saphenous vein reflux in the left lower extremity and the small saphenous vein and the posterior thigh is not competent. 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 With these results he definitely needs a referral to the vascular surgeons 09/20/2016 -- he has an appointment to see the vascular surgeons on May 1 10/03/2016 -- patient had a 4 layer compression on his left lower extremity and had a lot of discomfort and pain. 10/11/2016-- was seen by Dr. Althea [name] -- review of his data he recommended staged laser ablation of his great and then small saphenous veins for reduction odd of his venous hypertension. He did examine his right leg with the SonoSite and he has dilatation of the saphenous vein on the right lower extremity too. Since there was no evidence of ulceration he recommended observation of the right lower extremity. He recommended to continue with local wound care at the wound center until his wound was completely healed. 10/18/2016 -- the patient has not been very compliant with his compression, his diet and his diuretics  and as a result his lymphedema has increased markedly 10/25/2016 -- he has been compliant this week, has worn his 2 press compression wraps, taken his diuretics and is watching his salt intake. 11/08/2016 -- he had his venous procedure done last week and details of this have been reported and reviewed in his electronic medical record. he had a laser ablation of his great saphenous vein and this was done from mid calf to just below the saphenofemoral junction. He would return next week for ultrasound follow-up. He will then undergo the small saphenous vein ablation in a few weeks. 11/15/2016 -- his postprocedure visit showed good closure of his left great saphenous vein from the mid calf to 1-1/2 cm from the saphenofemoral junction. He had excellent early results from the ablation. The ablation of the left small saphenous vein was planned in a week 12/06/2016 -- he had his small saphenous vein venous ablation a week before and the duplex showed closure of his small saphenous vein to within half centimeter office saphenous popliteal junction with no DVT and he was pleased with the results. He recommended continue with elevation and compression and to see them back on an as-needed basis 12/19/16; the patient's wound is actually closed. Sometime over the weekend he developed a small abrasion on the lower calf just above the original wound on the left medial malleolus although this is closed as well Readmission: 01/18/18 on evaluation today patient presents after having last been seen in our clinic July 2018. Subsequently he is a reoccurrence of the ulcers on the left ankle both medial and now a new area lateral that had been present back room for about a month he tells me. He states that this has done very well for about a year he really does not know of any injury or anything that happened that would have caused this reopening at this point. He  has continued to wear compression stockings which is  appropriate. He does not have lymphedema pumps. He has continued to also keep the area clean and dry as best he could. With that being said now that is been reopened is been harder for him to take care of this as far as compression stockings are concerned keeping them clean along with continuing to manage his fluid and swelling. His ABI appears to be great on the left registering at 1.14 today. He has previously undergone a venous ablation. Other than compression/lymphedema pumps he's really done everything that he can to help manage and prevent these ulcers from forming. 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 01/30/18 on evaluation today patient appears to actually be doing very well in regard to his medial ankle ulcer. It's the lateral ulcer that actually is not doing quite as well today. Fortunately he did have some alginate left ovary start using this on the wounds and the medial ankle actually seems to be doing much better. The lateral ankle does actually need something to con a help with slough and buildup. The collagen really did not seem to do much for him in that regard. 02/13/18 on evaluation today patient appears to be doing about the same in regard to his ulcers. Unfortunately I do not feel like were making good progress at this time. When I have debrided the wound the slough seems to come back fairly quickly in the lateral ankle ulcer. There does not appear to be any evidence of infection at this time. No fevers, chills, nausea, or vomiting noted at this time. 02/27/18 on evaluation today patient actually appears to be showing some signs of improvement in regard to both wound areas. I'm insurance on the medial aspect of his left lower extremity at the ankle whether or not the alginate may be sticking and causing some tearing off of new skin attempting to grow. With that being said the patient states it does not seem to be doing such just pulls up some of the dried dead skin around. Nonetheless this  is something I wanted to watch out for going forward and actually I recommended that he take the dressing off in the shower when you could get it completely wet before removal to see how this does. Subsequently in regard to the lateral ankle ulcer he still has slough noted at this point although I do believe that he is tolerating the Medihoney much better I wish you could have gotten the Santyl but unfortunately the Santyl was too expensive gonna cost him $250. 03/06/18 on evaluation today patient appears to be doing about the same in regard to both ulcers of his left medial and lateral malleolus sites. He's been tolerating the dressing changes without complication. Unfortunately things do not seem to be improving in regard to either side at this time. Overall I think we may need to switch things up a little bit as far as the way we are performing the dressing changes currently. 03/27/18 on evaluation today patient's wound bed actually appears to be doing much better at both locations in regard to his ankle ulcers. He has been tolerating the dressing changes at this time without complication. Overall I'm very pleased with how things appear. The patient likewise states that he's much better in regard to discomfort at this time. 04/10/18 on evaluation today patient actually appears to be doing well in regard to his left lateral ankle ulcer. He has been tolerating the dressing changes without complication. Fortunately there  does not appear to be any evidence of infection. Overall I do feel like he is tolerating the Medihoney without complication. 04/24/18 upon evaluation today patient's wound actually appears to be healed on the lateral aspect of his ankle the medial aspect still continues to remain close without any evidence of complication at this time. Overall I have been extremely pleased with how things have progressed up to this point. The patient likewise is very happy he's not having any  significant pain this time. He does wears compression stocking on a regular basis. Readmission: 10-19-2021 upon evaluation today patient presents for evaluation here in the clinic concerning issues with a left lateral ulcer as well as a right lateral ulcer both along the aspect of his foot getting close to the ankle. This left area is the same place that I treated back in 2024. 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 This appears to be very inflamed and is also very painful. In the past he has been completely unable to tolerate the use of any compression wraps unfortunately. There does not appear to be any evidence of active infection locally nor systemically at this time which is good news. I do believe however we need to try to see what we can do to calm down the inflammation I think triamcinolone topically as well as oral prednisone would likely be indicated in this case. Patient's medical history really has not changed significantly since I last saw him in actually 2019 though I think is stated 2020 above. 10-26-21 upon evaluation today patient appears to still be having a lot of irritation and inflammation in regard to the ankles laterally. The left is greater than the right. With that being said I do believe that he would benefit from a biopsy to confirm whether or not this may indeed be vasculitis. It actually seems like that physically and on examination but again I want to be sure that we are on the right track as far as treatment is concerned. The good news is he did not have a DVT I called him about that on Friday this was all in regard to the right leg. Overall I am very pleased in that regard. 11-02-2021 upon evaluation today patient appears to be doing poorly still in regard to his left lower extremity. Apparently he had a fever on Sunday which was around 101 and he was severely sick feeling and disoriented according to his wife. She came with  him today because she did not think that he would actually tell me what was going on. With that being said unfortunately he did not go to the hospital he actually has been doing a little better over the past few days he been taking amoxicillin he has not taken any other medications orally at this point that are different. He has not been on any antibiotics either more recently other than the amoxicillin. Nonetheless he has not had a repeat in the past 3 days of that issue. 11-09-2021 upon evaluation today patient presents after having had quite an ordeal over the past several days. Subsequently yesterday I got a call from [facility] here in [place] concerning the patient and the fact that he was having bleeding that been going on for the past 24 hours and was not stopping. Subsequently my advice was to have the patient go to the ER for further evaluation and treatment. It seems to me that he had a regular way part  of the wound down to the point that a blood vessel and open. Apparently he was filling up trash bags wrapped around his foot with blood. In fact his lab review which I did do him as well today showed that he went from a white hemoglobin on 09-14-2021 of 13.6 down to a hemoglobin of 11.2 on 11-08-2021 this was yesterday. Subsequently this is due to the amount of blood loss that he has had acutely. Again I do not believe he is at transfusion stage but at the same time he is extremely weak and states that a couple times when he is going to stand up he is actually fallen back into his chair. Nonetheless I do believe that he probably needs to supplement with iron and I did recommend that there are some over-the-counter products he could get in this regard. Subsequently also suggested that the patient needs to be drinking plenty of water he apparently drinks Coke and he drinks coffee but that is about it. Subsequently following this conversation his wife was present during this time as well and I do think  that he is going to try to drink more I think it is of utmost importance. With that being said I did review his PCR culture as well it was positive for 2 organisms. This was Pseudomonas and Enterobacter. Both were showing up as being very prevalent in the PCR culture and subsequently Cipro will take care of the situation which is what I am recommending at this point based on what we see. This will take the place of the Bactrim and can have him discontinue the Bactrim the only thing is he is getting need to stop taking the hydroxyzine if he has been taking it that is the Vistaril and he tells me that he only takes this when he itches he has not been taking it recently. 6/7; patient arrives in clinic today with the wound on the right foot actually looking some better the area on the left lateral looking about the same. Problematically, he is having edema fluid leakage on the medial part of the left foot and ankle causing skin breakdown but no open wound. He has been very resistant to the notion of compression wraps. He has been using Santyl on both wound areas at home changing the dressing himself Partway through our visit today it became clear he had had a fall on Sunday 3 days ago. Since then he has had a lot of problems with his right shoulder 11-23-2021 upon evaluation today patient appears to be doing well with regard to his wound. Has been tolerating dressing changes without complication. Fortunately there does not appear to be any signs of infection he was wrapped last week when Dr. Dellia Nims saw him on the left. He was having some breakdown medially which they were concerned about. He did keep the wrap on until Monday but states it got extremely wet. For that reason I am thinking of doing a nurse visit on Friday so that we can get some of the swelling down right now by Friday we should have a lot of that out that we can switch out and put on a fresh dressing to get him through till next Wednesday. He  is in agreement with giving that a try. 11-30-2021 upon evaluation today patient actually appears to be doing better with regard to the overall appearance of his wound. Fortunately there does not appear to be any signs of active infection locally or systemically which is great  news. With that being said he has been tolerating the dressing changes without complication other than the fact that from Wednesday to Friday he did well with the compression wrap from Friday till yesterday he tells me the drainage was so bad and the smell was so bad he had to take this off. It does appear that he is very macerated around the lower portion down around the heel and I do think that this is an issue. Unfortunately we cannot get home health to help with this currently. 12-07-2021 upon evaluation today patient appears to be doing well currently in regard to his wound. He is actually showing signs of excellent improvement which is great news. I am very pleased with where things stand. I do think that the progress is being made. With that being said the patient is tolerating the Santyl quite well and I think is doing an awesome job for him. 12-14-2021 upon evaluation today patient appears to be doing well with regard to his wounds he is making progress is still draining a lot however which I completely understand. With that being said fortunately there does not appear to be any signs of active infection locally or systemically at this time. 12-21-2021 upon evaluation today patient appears to be doing well currently in regard to his wound. He is actually showing signs of significant improvement this is slow but nonetheless week by week we are seeing this improve on the lateral aspect of his foot. The measurements are little bit better but more importantly the wound surface is actually a whole lot cleaner. The patient is pleased to hear this. With that being said he tells me that it is really hard for him to see exactly what  all is going on. Nonetheless I am very pleased with all the new red tissue that were seeing granulating and hopefully we get this clean shortly and we can even switch to a different dressing to try to get it to close. 12-28-2021 upon evaluation today patient appears to be doing well currently in regard to his wound is actually showing signs of improvement we are going to perform some debridement today to clearway some of the necrotic debris. Overall I think that we are at the point where we do not have to use Santyl over the home wound I think using it just on the upper portion may be appropriate if we can get this completely cleaned away. 7/6; substantial wound on the left lateral foot. Using Santyl and Hydrofera Blue there is been good improvement since the last time I saw this. The patient does not tolerate compression wraps therefore he is using Ace wraps. Fortunately he appears to be doing this fairly well Objective Constitutional Sitting or standing Blood Pressure is within target range for patient.. Pulse regular and within target range for patient.Marland Kitchen Respirations regular, non-labored and within target range.. Temperature is normal and within the target range for the patient.Marland Kitchen Appears in no distress. Vitals Time Taken: 9:33 AM, Height: 66 in, Weight: 184 lbs, BMI: 29.7, Temperature: 97.9 F, Pulse: 67 bpm, Respiratory Rate: 18 breaths/min, Blood Pressure: 120/76 mmHg. Cardiovascular Pedal pulses are palpable on the left. General Notes: Wound exam; the wound is a lot better and measuring smaller. Still some surface slough but I did not think mechanical debridement was necessary. This is also made quite a bit of improvement. No evidence of infection his edema control is moderate but as long as this continues to improve the current compression is adequate. Integumentary (Hair,  Skin) Wound #6 status is Open. Original cause of wound was Gradually Appeared. The date acquired was: 07/25/2021. The  wound has been in treatment 11 weeks. The wound is located on the Left,Lateral Foot. The wound measures 5.8cm length x 4.8cm width x 0.2cm depth; 21.865cm^2 area and 4.373cm^3 volume. There is Fat Layer (Subcutaneous Tissue) exposed. There is no tunneling or undermining noted. There is a large amount of serosanguineous drainage noted. The wound margin is distinct with the outline attached to the wound base. There is large (67-100%) red, pink, hyper - granulation within the wound bed. There is a small (1- 33%) amount of necrotic tissue within the wound bed including Adherent Slough. 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 There was a Chemical/Enzymatic/Mechanical debridement performed by Ricard Dillon., MD.. Agent used was Santyl. A time out was conducted at 09:58, prior to the start of the procedure. There was no bleeding. The procedure was tolerated well. Post Debridement Measurements: 5.8cm length x 4.8cm width x 0.2cm depth; 4.373cm^3 volume. Character of Wound/Ulcer Post Debridement is stable. Post procedure Diagnosis Wound #6: Same as Pre-Procedure Plan Follow-up Appointments: Return Appointment in 1 week. - 01/11/22 @ 9:30am with Bernette Redbird, Room 7) Return Appointment in 2 weeks. - 01/18/22 @ 9:30AM Bathing/ Shower/ Hygiene: May shower and wash wound with soap and water. - wash with antibacterial soap when changing dressing Edema Control - Lymphedema / SCD / Other: Elevate legs to the level of the heart or above for  30 minutes daily and/or when sitting, a frequency of: - throughout the day Patient to wear own compression stockings every day. - Use stocking daily to right leg Moisturize legs daily. - Eucerin (in jar) Additional Orders / Instructions: Follow Nutritious Diet Non Wound Condition: Apply the following to affected area as directed: - Apply ABD pad to left medial leg WOUND #6: - Foot Wound Laterality: Left, Lateral Cleanser: Soap and Water Every Other Day/30 Days Discharge Instructions: May shower and wash wound with dial antibacterial soap and water prior to dressing change. Peri-Wound Care: Zinc Oxide Ointment 30g tube Every Other Day/30 Days Discharge Instructions: Apply Zinc Oxide to periwound with each dressing change Prim Dressing: Hydrofera Blue Ready Foam, 4x5 in (Generic) Every Other Day/30 Days ary Discharge Instructions: Apply to wound bed as instructed Prim Dressing: Santyl Ointment Every Other Day/30 Days ary Discharge Instructions: Apply to upper portion of wound only Secondary Dressing: ABD Pad, 5x9 (Generic) Every Other Day/30 Days Discharge Instructions: Apply over primary dressing as directed. Secondary Dressing: Woven Gauze Sponge, Non-Sterile 4x4 in (Generic) Every Other Day/30 Days Discharge Instructions: Apply over primary dressing as directed. Secured With: Elastic Bandage 4 inch (ACE bandage) (Generic) Every Other Day/30 Days Discharge Instructions: Secure with ACE bandage as directed. Secured With: [employer], 4.5x3.1 (in/yd) (Generic) Every Other Day/30 Days Discharge Instructions: Secure with Kerlix as directed. Secured With: 78M Medipore Public affairs consultant Surgical T 2x10 (in/yd) (Generic) Every Other Day/30 Days ape Discharge Instructions: Secure with tape as directed. Compression Wrap: Elastic 4" Bandage (ACE) Every Other Day/30 Days Discharge Instructions: Apply from base of  toes to just below knee 1. I did not change the dressing which is Santyl and  Hydrofera Blue he is changing and Ace wrapping this himself 2. This looks like the wound that might benefit from an advanced treatment product and apparently the team is already discussed this with him. Electronic Signature(s) Signed: 01/04/2022 4:38:07 PM By: Linton Ham MD Entered By: Linton Ham on 01/04/2022 10:19:05 -------------------------------------------------------------------------------- SuperBill Details Patient Name: Date of Service: [place], [name] 01/04/2022 Medical Record Number: [id number] Patient Account Number: [id number] Date of Birth/Sex: Treating RN: 06-19-1940 (81 y.o. 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 other skin ulcer Facility Procedures CPT4 Code: [id number] Description: [phone] - DEBRIDE W/O ANES NON SELECT ICD-10 Diagnosis Description L97.522 Non-pressure chronic ulcer of other part of left foot with fat layer exposed Modifier: Quantity: 1 Physician Procedures : CPT4 Code Description Modifier [id number] - WC PHYS LEVEL 3 - EST PT ICD-10 Diagnosis Description L97.522 Non-pressure chronic ulcer of other part of left foot with fat layer exposed I87.333 Chronic venous hypertension (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

## 2022-01-06 DIAGNOSIS — I83025 Varicose veins of left lower extremity with ulcer other part of foot: Secondary | ICD-10-CM | POA: Diagnosis not present

## 2022-01-06 DIAGNOSIS — L97522 Non-pressure chronic ulcer of other part of left foot with fat layer exposed: Secondary | ICD-10-CM | POA: Diagnosis not present

## 2022-01-10 ENCOUNTER — Other Ambulatory Visit: Payer: Self-pay

## 2022-01-10 NOTE — Patient Outreach (Signed)
[facility]) Care Management  01/10/2022  [name] Fernandez [birth year] [id number]   Telephone call to patient for nurse call. Discussed with patient reason for call. Patient refused at this time but agreeable to receive letter and brochure.   Plan: RN CM closing case.    Jone Baseman, RN, MSN [facility] Care Management Care Management Coordinator Direct Line [phone] Toll Free: [phone]  Fax: [phone]

## 2022-01-11 ENCOUNTER — Encounter (HOSPITAL_BASED_OUTPATIENT_CLINIC_OR_DEPARTMENT_OTHER): Payer: Medicare HMO | Attending: Physician Assistant | Admitting: Physician Assistant

## 2022-01-11 DIAGNOSIS — G8929 Other chronic pain: Secondary | ICD-10-CM | POA: Insufficient documentation

## 2022-01-11 DIAGNOSIS — Z87891 Personal history of nicotine dependence: Secondary | ICD-10-CM | POA: Diagnosis not present

## 2022-01-11 DIAGNOSIS — L97522 Non-pressure chronic ulcer of other part of left foot with fat layer exposed: Secondary | ICD-10-CM | POA: Insufficient documentation

## 2022-01-11 DIAGNOSIS — L958 Other vasculitis limited to the skin: Secondary | ICD-10-CM | POA: Insufficient documentation

## 2022-01-11 DIAGNOSIS — I87333 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 extremity: Secondary | ICD-10-CM | POA: Insufficient documentation

## 2022-01-11 DIAGNOSIS — E785 Hyperlipidemia, unspecified: Secondary | ICD-10-CM | POA: Diagnosis not present

## 2022-01-11 DIAGNOSIS — E11622 Type 2 diabetes mellitus with other skin ulcer: Secondary | ICD-10-CM | POA: Insufficient documentation

## 2022-01-11 DIAGNOSIS — I10 Essential (primary) hypertension: Secondary | ICD-10-CM | POA: Insufficient documentation

## 2022-01-11 DIAGNOSIS — I890 Lymphedema, not elsewhere classified: Secondary | ICD-10-CM | POA: Diagnosis not present

## 2022-01-11 DIAGNOSIS — E11621 Type 2 diabetes mellitus with foot ulcer: Secondary | ICD-10-CM | POA: Diagnosis not present

## 2022-01-11 DIAGNOSIS — L97512 Non-pressure chronic ulcer of other part of right foot with fat layer exposed: Secondary | ICD-10-CM | POA: Insufficient documentation

## 2022-01-11 NOTE — Progress Notes (Signed)
MCCLELLAN, DEMARAIS ([id number]) Visit Report for 01/11/2022 Arrival Information Details Patient Name: Date of Service: [place], [name] 01/11/2022 9:30 A M Medical Record Number: [id number] Patient Account Number: [id number] Date of Birth/Sex: Treating RN: February 24, 1941 (81 y.o. [name] Primary Care Zayin Valadez: [name] Other Clinician: Referring Gianlucca Szymborski: Treating Shalin Vonbargen/Extender: Azucena Kuba in Treatment: 12 Visit Information History Since Last Visit Added or deleted any medications: No Patient Arrived: Ambulatory Any new allergies or adverse reactions: No Arrival Time: 09:26 Had a fall or experienced change in No Transfer Assistance: None activities of daily living that may affect Patient Identification Verified: Yes risk of falls: Secondary Verification Process Completed: Yes Signs or symptoms of abuse/neglect since last visito No Patient Requires Transmission-Based Precautions: No Hospitalized since last visit: No Patient Has Alerts: No Implantable device outside of the clinic excluding No cellular tissue based products placed in the center since last visit: Has Dressing in Place as Prescribed: Yes Has Compression in Place as Prescribed: Yes Pain Present Now: No Electronic Signature(s) Signed: 01/11/2022 5:05:50 PM By: [name] Entered By: [name] on 01/11/2022 29:51:88 -------------------------------------------------------------------------------- Encounter Discharge Information Details Patient Name: Date of Service: [name], [name] 01/11/2022 9:30 A M Medical Record Number: [id number] Patient Account Number: [id number] Date of Birth/Sex: Treating RN: 06/09/1941 (81 y.o. [name] Primary Care Calen Geister: [name] Other Clinician: Referring Anniebell Bedore: Treating Earvin Blazier/Extender: Azucena Kuba in Treatment: 12 Encounter Discharge Information Items Post Procedure  Vitals Discharge Condition: Stable Temperature (F): 98 Ambulatory Status: Ambulatory Pulse (bpm): 63 Discharge Destination: Home Respiratory Rate (breaths/min): 18 Transportation: Private Auto Blood Pressure (mmHg): 161/83 Schedule Follow-up Appointment: Yes Clinical Summary of Care: Provided on 01/11/2022 Form Type Recipient Paper Patient Patient Electronic Signature(s) Signed: 01/11/2022 5:05:50 PM By: [name] Entered By: [name] on 01/11/2022 10:18:57 -------------------------------------------------------------------------------- Lower Extremity Assessment Details Patient Name: Date of Service: [place], [name] 01/11/2022 9:30 A M Medical Record Number: [id number] Patient Account Number: [id number] Date of Birth/Sex: Treating RN: [birth year] (81 y.o. [name] Primary Care Ledia Hanford: [name] Other Clinician: Referring Jermie Hippe: Treating Mattie Nordell/Extender: Azucena Kuba in Treatment: 12 Edema Assessment Assessed: [Left: Yes] Patrice Paradise: No] Edema: [Left: Ye] [Right: s] Calf Left: Right: Point of Measurement: 32 cm From Medial Instep 38.5 cm Ankle Left: Right: Point of Measurement: 10 cm From Medial Instep 25 cm Vascular Assessment Pulses: Dorsalis Pedis Palpable: [Left:Yes] Electronic Signature(s) Signed: 01/11/2022 5:05:50 PM By: [name] Entered By: [name] on 01/11/2022 09:37:40 -------------------------------------------------------------------------------- Multi Wound Chart Details Patient Name: Date of Service: [name], [place] [name] 01/11/2022 9:30 A M Medical Record Number: [id number] Patient Account Number: [id number] Date of Birth/Sex: Treating RN: January 01, 1941 (82 y.o. [name] Primary Care Elo Marmolejos: [name] Other Clinician: Referring Leoni Goodness: Treating Buelah Rennie/Extender: Azucena Kuba in Treatment: 12 Vital Signs Height(in): 28 Pulse(bpm):  59 Weight(lbs): 184 Blood Pressure(mmHg): 161/83 Body Mass Index(BMI): 29.7 Temperature(F): 98.0 Respiratory Rate(breaths/min): 18 Photos: [6:Left, Lateral Foot] [N/A:N/A N/A] Wound Location: [6:Gradually Appeared] [N/A:N/A] Wounding Event: [6:Vasculitis] [N/A:N/A] Primary Etiology: [6:Anemia, Chronic Obstructive] [N/A:N/A] Comorbid History: [6:Pulmonary Disease (COPD), Hypertension, Peripheral Venous Disease, Type II Diabetes, Osteoarthritis, Neuropathy 07/25/2021] [N/A:N/A] Date Acquired: [6:12] [N/A:N/A] Weeks of Treatment: [6:Open] [N/A:N/A] Wound Status: [6:No] [N/A:N/A] Wound Recurrence: [6:5.9x3.8x0.2] [N/A:N/A] Measurements L x W x D (cm) [6:17.609] [N/A:N/A] A (cm) : rea [6:3.522] [N/A:N/A] Volume (cm) : [6:9.60%] [N/A:N/A] %  Reduction in A [6:rea: 9.60%] [N/A:N/A] % Reduction in Volume: [6:Full Thickness Without Exposed] [N/A:N/A] Classification: [6:Support Structures Large] [N/A:N/A] Exudate A mount: [6:Serosanguineous] [N/A:N/A] Exudate Type: [6:red, brown] [N/A:N/A] Exudate Color: [6:Distinct, outline attached] [N/A:N/A] Wound Margin: [6:Large (67-100%)] [N/A:N/A] Granulation A mount: [6:Red, Pink, Hyper-granulation] [N/A:N/A] Granulation Quality: [6:Small (1-33%)] [N/A:N/A] Necrotic A mount: [6:Fat Layer (Subcutaneous Tissue): Yes N/A] Exposed Structures: [6:Fascia: No Tendon: No Muscle: No Joint: No Bone: No Small (1-33%)] [N/A:N/A] Epithelialization: [6:Chemical/Enzymatic/Mechanical] [N/A:N/A] Debridement: Pre-procedure Verification/Time Out 10:10 [N/A:N/A] Taken: [6:Lidocaine 5% topical ointment] [N/A:N/A] Pain Control: [6:N/A] [N/A:N/A] Instrument: [6:None] [N/A:N/A] Bleeding: [6:Procedure was tolerated well] [N/A:N/A] Debridement Treatment Response: [6:5.9x3.8x0.2] [N/A:N/A] Post Debridement Measurements L x W x D (cm) [6:3.522] [N/A:N/A] Post Debridement Volume: (cm) [6:Debridement] [N/A:N/A] Treatment Notes Wound #6 (Foot) Wound Laterality: Left,  Lateral Cleanser Soap and Water Discharge Instruction: May shower and wash wound with dial antibacterial soap and water prior to dressing change. Peri-Wound Care Zinc Oxide Ointment 30g tube Discharge Instruction: Apply Zinc Oxide to periwound with each dressing change Topical Gentamicin Discharge Instruction: Apply over entire wound and around edges Primary Dressing Hydrofera Blue Ready Foam, 4x5 in Discharge Instruction: Apply to wound bed as instructed Santyl Ointment Discharge Instruction: Apply to upper portion of wound only Secondary Dressing ABD Pad, 5x9 Discharge Instruction: Apply over primary dressing as directed. Secured With Elastic Bandage 4 inch (ACE bandage) Discharge Instruction: Secure with ACE bandage as directed. Kerlix Roll Sterile, 4.5x3.1 (in/yd) Discharge Instruction: Secure with Kerlix as directed. 28M Medipore Soft Cloth Surgical T 2x10 (in/yd) ape Discharge Instruction: Secure with tape as directed. Compression Wrap Elastic 4" Bandage (ACE) Discharge Instruction: Apply from base of toes to just below knee Compression Stockings Add-Ons Electronic Signature(s) Signed: 01/11/2022 11:08:27 AM By: [name] Entered By: [name] on 01/11/2022 11:08:27 -------------------------------------------------------------------------------- Venus Details Patient Name: Date of Service: [place], [name] 01/11/2022 9:30 A M Medical Record Number: [id number] Patient Account Number: [id number] Date of Birth/Sex: Treating RN: Jul 31, 1940 (81 y.o. 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 Compression as ordered Notes: 11/16/21: Edema not controlled, patient not compliant with compression. 12/14/21:Edema not well controlled, patient will not allow compression wraps. Wound/Skin Impairment Nursing Diagnoses: Impaired tissue integrity Goals: Patient/caregiver will verbalize understanding of skin care regimen Date Initiated: 10/19/2021 Target Resolution Date: 02/08/2022 Goal Status: Active Ulcer/skin breakdown will have a volume reduction of 30% by week 4 Date Initiated: 10/19/2021 Date Inactivated: 12/14/2021 Target Resolution Date: 12/14/2021 Unmet Reason: infection, non Goal Status: Unmet compliance Interventions: Assess patient/caregiver ability to obtain necessary supplies Assess patient/caregiver ability to perform ulcer/skin care regimen upon admission and as needed Assess ulceration(s) every visit Provide education on ulcer and skin care Treatment Activities: Topical wound management initiated : 10/19/2021 Notes: 11/16/21: Wound care continues, patient not compliant with edema control. Electronic Signature(s) Signed: 01/11/2022 5:05:50 PM By: [name] Entered By: [name] on 01/11/2022 09:25:15 -------------------------------------------------------------------------------- Pain Assessment Details Patient Name: Date of Service: [place], [name] 01/11/2022 9:30 A M Medical Record Number: [id number] Patient Account Number: [id number] Date of Birth/Sex: Treating RN: 1940-09-30 (81 y.o. [name] Primary Care Linville Decarolis: [name] Other Clinician: Referring Sinclair Arrazola:  Treating Horton Ellithorpe/Extender: Marcos Eke Weeks in Treatment: 12 Active Problems Location of Pain Severity and Description of Pain Patient Has Paino No Site Locations Pain Management and Medication Current Pain Management: Electronic Signature(s) Signed:  01/11/2022 5:05:50 PM By: [name] Entered By: [name] on 01/11/2022 09:30:56 -------------------------------------------------------------------------------- Patient/Caregiver Education Details Patient Name: Date of Service: [place] [name] 8/2/2023andnbsp9:30 A M Medical Record Number: [id number] Patient Account Number: [id number] Date of Birth/Gender: Treating RN: 02-Aug-1940 (81 y.o. [name] Primary Care Physician: [name] Other Clinician: Referring Physician: Treating Physician/Extender: Azucena Kuba in Treatment: 12 Education Assessment Education Provided To: Patient Education Topics Provided Venous: Methods: Explain/Verbal, Printed Responses: Reinforcements needed, State content correctly Wound/Skin Impairment: Methods: Explain/Verbal, Printed Responses: State content correctly Electronic Signature(s) Signed: 01/11/2022 5:05:50 PM By: [name] Entered By: [name] on 01/11/2022 09:25:59 -------------------------------------------------------------------------------- Wound Assessment Details Patient Name: Date of Service: [name], [place] [name] 01/11/2022 9:30 A M Medical Record Number: [id number] Patient Account Number: [id number] Date of Birth/Sex: Treating RN: 1941/03/20 (81 y.o. [name] Primary Care Kellon Chalk: [name] Other Clinician: Referring Eyal Greenhaw: Treating Nat Lowenthal/Extender: Azucena Kuba in Treatment: 12 Wound Status Wound Number: 6 Primary Vasculitis Etiology: Wound Location: Left, Lateral Foot Wound Open Wounding Event: Gradually Appeared Status: Date Acquired: 07/25/2021 Comorbid Anemia, Chronic Obstructive Pulmonary Disease (COPD), Weeks Of Treatment: 12 History: Hypertension, Peripheral Venous Disease, Type II Diabetes, Clustered Wound: No Osteoarthritis, Neuropathy Photos Wound Measurements Length: (cm) 5.9 Width: (cm)  3.8 Depth: (cm) 0.2 Area: (cm) 17.609 Volume: (cm) 3.522 % Reduction in Area: 9.6% % Reduction in Volume: 9.6% Epithelialization: Small (1-33%) Tunneling: No Undermining: No Wound Description Classification: Full Thickness Without Exposed Support Structures Wound Margin: Distinct, outline attached Exudate Amount: Large Exudate Type: Serosanguineous Exudate Color: red, brown Foul Odor After Cleansing: No Slough/Fibrino Yes Wound Bed Granulation Amount: Large (67-100%) Exposed Structure Granulation Quality: Red, Pink, Hyper-granulation Fascia Exposed: No Necrotic Amount: Small (1-33%) Fat Layer (Subcutaneous Tissue) Exposed: Yes Necrotic Quality: Adherent Slough Tendon Exposed: No Muscle Exposed: No Joint Exposed: No Bone Exposed: No Treatment Notes Wound #6 (Foot) Wound Laterality: Left, Lateral Cleanser Soap and Water Discharge Instruction: May shower and wash wound with dial antibacterial soap and water prior to dressing change. Peri-Wound Care Zinc Oxide Ointment 30g tube Discharge Instruction: Apply Zinc Oxide to periwound with each dressing change Topical Gentamicin Discharge Instruction: Apply over entire wound and around edges Primary Dressing Hydrofera Blue Ready Foam, 4x5 in Discharge Instruction: Apply to wound bed as instructed Santyl Ointment Discharge Instruction: Apply to upper portion of wound only Secondary Dressing ABD Pad, 5x9 Discharge Instruction: Apply over primary dressing as directed. Secured With Elastic Bandage 4 inch (ACE bandage) Discharge Instruction: Secure with ACE bandage as directed. Kerlix Roll Sterile, 4.5x3.1 (in/yd) Discharge Instruction: Secure with Kerlix as directed. 50M Medipore Soft Cloth Surgical T 2x10 (in/yd) ape Discharge Instruction: Secure with tape as directed. Compression Wrap Elastic 4" Bandage (ACE) Discharge Instruction: Apply from base of toes to just below knee Compression Stockings Add-Ons Electronic  Signature(s) Signed: 01/11/2022 5:05:50 PM By: [name] Entered By: [name] on 01/11/2022 09:35:55 -------------------------------------------------------------------------------- Vitals Details Patient Name: Date of Service: [name], [place] [name] 01/11/2022 9:30 A M Medical Record Number: [id number] Patient Account Number: [id number] Date of Birth/Sex: Treating RN: 1940-10-02 (81 y.o. [name] Primary Care Arien Morine: [name] Other Clinician: Referring Syna Gad: Treating Nan Maya/Extender: [name]  [name] Weeks in Treatment: 12 Vital Signs Time Taken: 09:28 Temperature (F): 98.0 Height (in): 66 Pulse (bpm): 63 Weight (lbs): 184 Respiratory Rate (breaths/min): 18 Body Mass Index (BMI): 29.7 Blood Pressure (mmHg): 161/83 Reference Range: 80 - 120 mg / dl Electronic Signature(s) Signed: 01/11/2022 5:05:50 PM By: [name] Entered By: [name] on 01/11/2022 09:30:04

## 2022-01-11 NOTE — Progress Notes (Signed)
SHAWNEE, HIGHAM ([id number]) Visit Report for 01/11/2022 Chief Complaint Document Details Patient Name: Date of Service: [place], [name] 01/11/2022 9:30 A M Medical Record Number: [id number] Patient Account Number: [id number] Date of Birth/Sex: Treating RN: [birth year] (81 y.o. [name] Primary Care Provider: [name] Other Clinician: Referring Provider: Treating Provider/Extender: Azucena Kuba in Treatment: 12 Information Obtained from: Patient Chief Complaint Bilateral foot ulcers Electronic Signature(s) Signed: 01/11/2022 10:06:42 AM By: [name] PA-C Entered By: [name] on 01/11/2022 10:06:42 -------------------------------------------------------------------------------- Debridement Details Patient Name: Date of Service: [place], [name] 01/11/2022 9:30 A M Medical Record Number: [id number] Patient Account Number: [id number] Date of Birth/Sex: Treating RN: [birth year] (81 y.o. [name] Primary Care Provider: [name] Other Clinician: Referring Provider: Treating Provider/Extender: Azucena Kuba in Treatment: 12 Debridement Performed for Assessment: Wound #6 Left,Lateral Foot Performed By: Physician [name], PA Debridement Type: Chemical/Enzymatic/Mechanical Agent Used: Santyl Level of Consciousness (Pre-procedure): Awake and Alert Pre-procedure Verification/Time Out Yes - 10:10 Taken: Start Time: 10:11 Pain Control: Lidocaine 5% topical ointment Bleeding: None End Time: 10:14 Response to Treatment: Procedure was tolerated well Level of Consciousness (Post- Awake and Alert procedure): Post Debridement Measurements of Total Wound Length: (cm) 5.9 Width: (cm) 3.8 Depth: (cm) 0.2 Volume: (cm) 3.522 Character of Wound/Ulcer Post Debridement: Stable Post Procedure Diagnosis Same as Pre-procedure Electronic Signature(s) Signed: 01/11/2022 5:04:53 PM By: [name] PA-C Signed: 01/11/2022 5:05:50 PM By: [name] Signed: 01/11/2022 5:05:50 PM By: [name] Entered By: [name] on 01/11/2022 10:15:10 -------------------------------------------------------------------------------- HPI Details Patient Name: Date of Service: [name] 01/11/2022 9:30 A M Medical Record Number: [id number] Patient Account Number: [id number] Date of Birth/Sex: Treating RN: 1941/01/31 (81 y.o. [name] Primary Care Provider: [name] Other Clinician: Referring Provider: Treating Provider/Extender: Azucena Kuba in Treatment: 12 History of Present Illness HPI Description: 81 year old gentleman known to have swelling of his left lower extremity with some ulceration along the medial ankle has been having these problems for the last 3-4 months and does not know how it came on. No history of injury or no history of any infection in this area. He is known to have diabetes mellitus controlled with diet, hyperlipidemia, hypertension and chronic lumbar back pain with sciatica which is being treated by his PCP. Last hemoglobin A1c was 6.3 in June 2017. Last medical history is significant for esophageal stricture, hiatal hernia, vitamin D deficiency, status post back surgery 3, hernia repair as a child for both groins, hiatal hernia repair, joint replacement and revision of a knee surgery on the right side. He has quit smoking in 1982. He has never had a Doppler study of his lower extremity except remotely when he had knee surgery they looked for a blood clot on the right side. 09/13/2016 -- venous reflux study done on 09/12/2016 showed there is evidence of greater saphenous vein reflux in the left lower extremity and the small saphenous vein and the posterior thigh is not competent. 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 With these results he definitely needs a referral to the vascular  surgeons 09/20/2016 -- he has an appointment to see the vascular surgeons on May 1 10/03/2016 -- patient had a 4 layer compression on his left lower extremity and had a lot of discomfort and pain. 10/11/2016-- was seen by Dr.  T Early -- review of his data he recommended staged laser ablation of his great and then small saphenous veins for reduction odd of his venous hypertension. He did examine his right leg with the SonoSite and he has dilatation of the saphenous vein on the right lower extremity too. Since there was no evidence of ulceration he recommended observation of the right lower extremity. He recommended to continue with local wound care at the wound center until his wound was completely healed. 10/18/2016 -- the patient has not been very compliant with his compression, his diet and his diuretics and as a result his lymphedema has increased markedly 10/25/2016 -- he has been compliant this week, has worn his 2 press compression wraps, taken his diuretics and is watching his salt intake. 11/08/2016 -- he had his venous procedure done last week and details of this have been reported and reviewed in his electronic medical record. he had a laser ablation of his great saphenous vein and this was done from mid calf to just below the saphenofemoral junction. He would return next week for ultrasound follow-up. He will then undergo the small saphenous vein ablation in a few weeks. 11/15/2016 -- his postprocedure visit showed good closure of his left great saphenous vein from the mid calf to 1-1/2 cm from the saphenofemoral junction. He had excellent early results from the ablation. The ablation of the left small saphenous vein was planned in a week 12/06/2016 -- he had his small saphenous vein venous ablation a week before and the duplex showed closure of his small saphenous vein to within half centimeter office saphenous popliteal junction with no DVT and he was pleased with the results. He  recommended continue with elevation and compression and to see them back on an as-needed basis 12/19/16; the patient's wound is actually closed. Sometime over the weekend he developed a small abrasion on the lower calf just above the original wound on the left medial malleolus although this is closed as well Readmission: 01/18/18 on evaluation today patient presents after having last been seen in our clinic July 2018. Subsequently he is a reoccurrence of the ulcers on the left ankle both medial and now a new area lateral that had been present back room for about a month he tells me. He states that this has done very well for about a year he really does not know of any injury or anything that happened that would have caused this reopening at this point. He has continued to wear compression stockings which is appropriate. He does not have lymphedema pumps. He has continued to also keep the area clean and dry as best he could. With that being said now that is been reopened is been harder for him to take care of this as far as compression stockings are concerned keeping them clean along with continuing to manage his fluid and swelling. His ABI appears to be great on the left registering at 1.14 today. He has previously undergone a venous ablation. Other than compression/lymphedema pumps he's really done everything that he can to help manage and prevent these ulcers from forming. 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 01/30/18 on evaluation today patient appears to actually be doing very well in regard to his medial ankle ulcer. It's the lateral ulcer that actually is not doing quite as well today. Fortunately he did have some alginate left ovary start using this on the wounds and the medial ankle actually seems to be doing much better. The lateral ankle does  actually need something to con a help with slough and buildup. The collagen really did not seem to do much for him in that regard. 02/13/18 on evaluation today  patient appears to be doing about the same in regard to his ulcers. Unfortunately I do not feel like were making good progress at this time. When I have debrided the wound the slough seems to come back fairly quickly in the lateral ankle ulcer. There does not appear to be any evidence of infection at this time. No fevers, chills, nausea, or vomiting noted at this time. 02/27/18 on evaluation today patient actually appears to be showing some signs of improvement in regard to both wound areas. I'm insurance on the medial aspect of his left lower extremity at the ankle whether or not the alginate may be sticking and causing some tearing off of new skin attempting to grow. With that being said the patient states it does not seem to be doing such just pulls up some of the dried dead skin around. Nonetheless this is something I wanted to watch out for going forward and actually I recommended that he take the dressing off in the shower when you could get it completely wet before removal to see how this does. Subsequently in regard to the lateral ankle ulcer he still has slough noted at this point although I do believe that he is tolerating the Medihoney much better I wish you could have gotten the Santyl but unfortunately the Santyl was too expensive gonna cost him $250. 03/06/18 on evaluation today patient appears to be doing about the same in regard to both ulcers of his left medial and lateral malleolus sites. He's been tolerating the dressing changes without complication. Unfortunately things do not seem to be improving in regard to either side at this time. Overall I think we may need to switch things up a little bit as far as the way we are performing the dressing changes currently. 03/27/18 on evaluation today patient's wound bed actually appears to be doing much better at both locations in regard to his ankle ulcers. He has been tolerating the dressing changes at this time without complication. Overall  I'm very pleased with how things appear. The patient likewise states that he's much better in regard to discomfort at this time. 04/10/18 on evaluation today patient actually appears to be doing well in regard to his left lateral ankle ulcer. He has been tolerating the dressing changes without complication. Fortunately there does not appear to be any evidence of infection. Overall I do feel like he is tolerating the Medihoney without complication. 04/24/18 upon evaluation today patient's wound actually appears to be healed on the lateral aspect of his ankle the medial aspect still continues to remain close without any evidence of complication at this time. Overall I have been extremely pleased with how things have progressed up to this point. The patient likewise is very happy he's not having any significant pain this time. He does wears compression stocking on a regular basis. Readmission: 10-19-2021 upon evaluation today patient presents for evaluation here in the clinic concerning issues with a left lateral ulcer as well as a right lateral ulcer both along the aspect of his foot getting close to the ankle. This left area is the same place that I treated back in 2024. 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 This appears to be very inflamed and is also very painful. In the  past he has been completely unable to tolerate the use of any compression wraps unfortunately. There does not appear to be any evidence of active infection locally nor systemically at this time which is good news. I do believe however we need to try to see what we can do to calm down the inflammation I think triamcinolone topically as well as oral prednisone would likely be indicated in this case. Patient's medical history really has not changed significantly since I last saw him in actually 2019 though I think is stated 2020 above. 10-26-21 upon evaluation today patient appears to still  be having a lot of irritation and inflammation in regard to the ankles laterally. The left is greater than the right. With that being said I do believe that he would benefit from a biopsy to confirm whether or not this may indeed be vasculitis. It actually seems like that physically and on examination but again I want to be sure that we are on the right track as far as treatment is concerned. The good news is he did not have a DVT I called him about that on Friday this was all in regard to the right leg. Overall I am very pleased in that regard. 11-02-2021 upon evaluation today patient appears to be doing poorly still in regard to his left lower extremity. Apparently he had a fever on Sunday which was around 101 and he was severely sick feeling and disoriented according to his wife. She came with him today because she did not think that he would actually tell me what was going on. With that being said unfortunately he did not go to the hospital he actually has been doing a little better over the past few days he been taking amoxicillin he has not taken any other medications orally at this point that are different. He has not been on any antibiotics either more recently other than the amoxicillin. Nonetheless he has not had a repeat in the past 3 days of that issue. 11-09-2021 upon evaluation today patient presents after having had quite an ordeal over the past several days. Subsequently yesterday I got a call from [facility] here in [place] concerning the patient and the fact that he was having bleeding that been going on for the past 24 hours and was not stopping. Subsequently my advice was to have the patient go to the ER for further evaluation and treatment. It seems to me that he had a regular way part of the wound down to the point that a blood vessel and open. Apparently he was filling up trash bags wrapped around his foot with blood. In fact his lab review which I did do him as well today showed that  he went from a white hemoglobin on 09-14-2021 of 13.6 down to a hemoglobin of 11.2 on 11-08-2021 this was yesterday. Subsequently this is due to the amount of blood loss that he has had acutely. Again I do not believe he is at transfusion stage but at the same time he is extremely weak and states that a couple times when he is going to stand up he is actually fallen back into his chair. Nonetheless I do believe that he probably needs to supplement with iron and I did recommend that there are some over-the-counter products he could get in this regard. Subsequently also suggested that the patient needs to be drinking plenty of water he apparently drinks Coke and he drinks coffee but that is about it. Subsequently following this conversation  his wife was present during this time as well and I do think that he is going to try to drink more I think it is of utmost importance. With that being said I did review his PCR culture as well it was positive for 2 organisms. This was Pseudomonas and Enterobacter. Both were showing up as being very prevalent in the PCR culture and subsequently Cipro will take care of the situation which is what I am recommending at this point based on what we see. This will take the place of the Bactrim and can have him discontinue the Bactrim the only thing is he is getting need to stop taking the hydroxyzine if he has been taking it that is the Vistaril and he tells me that he only takes this when he itches he has not been taking it recently. 6/7; patient arrives in clinic today with the wound on the right foot actually looking some better the area on the left lateral looking about the same. Problematically, he is having edema fluid leakage on the medial part of the left foot and ankle causing skin breakdown but no open wound. He has been very resistant to the notion of compression wraps. He has been using Santyl on both wound areas at home changing the dressing himself Partway through  our visit today it became clear he had had a fall on Sunday 3 days ago. Since then he has had a lot of problems with his right shoulder 11-23-2021 upon evaluation today patient appears to be doing well with regard to his wound. Has been tolerating dressing changes without complication. Fortunately there does not appear to be any signs of infection he was wrapped last week when Dr. Dellia Nims saw him on the left. He was having some breakdown medially which they were concerned about. He did keep the wrap on until Monday but states it got extremely wet. For that reason I am thinking of doing a nurse visit on Friday so that we can get some of the swelling down right now by Friday we should have a lot of that out that we can switch out and put on a fresh dressing to get him through till next Wednesday. He is in agreement with giving that a try. 11-30-2021 upon evaluation today patient actually appears to be doing better with regard to the overall appearance of his wound. Fortunately there does not appear to be any signs of active infection locally or systemically which is great news. With that being said he has been tolerating the dressing changes without complication other than the fact that from Wednesday to Friday he did well with the compression wrap from Friday till yesterday he tells me the drainage was so bad and the smell was so bad he had to take this off. It does appear that he is very macerated around the lower portion down around the heel and I do think that this is an issue. Unfortunately we cannot get home health to help with this currently. 12-07-2021 upon evaluation today patient appears to be doing well currently in regard to his wound. He is actually showing signs of excellent improvement which is great news. I am very pleased with where things stand. I do think that the progress is being made. With that being said the patient is tolerating the Santyl quite well and I think is doing an awesome job  for him. 12-14-2021 upon evaluation today patient appears to be doing well with regard to his wounds he is making  progress is still draining a lot however which I completely understand. With that being said fortunately there does not appear to be any signs of active infection locally or systemically at this time. 12-21-2021 upon evaluation today patient appears to be doing well currently in regard to his wound. He is actually showing signs of significant improvement this is slow but nonetheless week by week we are seeing this improve on the lateral aspect of his foot. The measurements are little bit better but more importantly the wound surface is actually a whole lot cleaner. The patient is pleased to hear this. With that being said he tells me that it is really hard for him to see exactly what all is going on. Nonetheless I am very pleased with all the new red tissue that were seeing granulating and hopefully we get this clean shortly and we can even switch to a different dressing to try to get it to close. 12-28-2021 upon evaluation today patient appears to be doing well currently in regard to his wound is actually showing signs of improvement we are going to perform some debridement today to clearway some of the necrotic debris. Overall I think that we are at the point where we do not have to use Santyl over the home wound I think using it just on the upper portion may be appropriate if we can get this completely cleaned away. 7/6; substantial wound on the left lateral foot. Using Santyl and Hydrofera Blue there is been good improvement since the last time I saw this. The patient does not tolerate compression wraps therefore he is using Ace wraps. Fortunately he appears to be doing this fairly well 01-11-22 upon evaluation today patient appears to be doing better in regard to his wound in general though he is having some issues here with blue-green drainage. I am going to go ahead and see about getting  him sent in a prescription for gentamicin cream. Electronic Signature(s) Signed: 01/11/2022 10:20:49 AM By: [name] PA-C Entered By: [name] on 01/11/2022 10:20:49 -------------------------------------------------------------------------------- Physical Exam Details Patient Name: Date of Service: [place], [name] 01/11/2022 9:30 A M Medical Record Number: [id number] Patient Account Number: [id number] Date of Birth/Sex: Treating RN: [birth year] (81 y.o. [name] Primary Care Provider: [name] Other Clinician: Referring Provider: Treating Provider/Extender: Azucena Kuba in Treatment: 36 Constitutional Well-nourished and well-hydrated in no acute distress. Respiratory normal breathing without difficulty. Psychiatric this patient is able to make decisions and demonstrates good insight into disease process. Alert and Oriented x 3. pleasant and cooperative. Notes Upon inspection patient's wound bed actually showed signs of good granulation and epithelization at this point. Fortunately I do not see any evidence of active infection locally or systemically which is great news and overall I am extremely pleased in that regard. I do think that with the blue-green drainage that he may benefit from some topical gentamicin which I will go ahead and send into the pharmacy as well. Electronic Signature(s) Signed: 01/11/2022 10:21:11 AM By: [name] PA-C Entered By: [name] on 01/11/2022 10:21:11 -------------------------------------------------------------------------------- Physician Orders Details Patient Name: Date of Service: [place], [name] 01/11/2022 9:30 A M Medical Record Number: [id number] Patient Account Number: [id number] Date of Birth/Sex: Treating RN: September 07, 1940 (81 y.o. [name] Primary Care Provider: [name] Other Clinician: Referring Provider: Treating Provider/Extender: Azucena Kuba in Treatment: 12 Verbal / Phone  Orders: No Diagnosis Coding Follow-up Appointments ppointment in 1 week. - 01/18/22 @ 9:30AM with Bernette Redbird, Room 7) Return A ppointment in 2 weeks. - 01/25/22 @ 8:45AM Return A Cellular or Tissue Based Products Cellular or Tissue Based Product Type: - Will run IVR for Apligraf Bathing/ Shower/ Hygiene May shower and wash wound with soap and water. - wash with antibacterial soap when changing dressing Edema Control - Lymphedema / SCD / Other Elevate legs to the level of the heart or above for 30 minutes daily and/or when sitting, a frequency of: - throughout the day Patient to wear own compression stockings every day. - Use stocking daily to right leg Moisturize legs daily. - Eucerin (in jar) Additional Orders / Instructions Follow Nutritious Diet Non Wound Condition pply the following to affected area as directed: - Apply ABD pad to left medial leg A Wound Treatment Wound #6 - Foot Wound Laterality: Left, Lateral Cleanser: Soap and Water Every Other Day/30 Days Discharge Instructions: May shower and wash wound with dial antibacterial soap and water prior to dressing change. Peri-Wound Care: Zinc Oxide Ointment 30g tube Every Other Day/30 Days Discharge Instructions: Apply Zinc Oxide to periwound with each dressing change Topical: Gentamicin Every Other Day/30 Days Discharge Instructions: Apply over entire wound and around edges Prim Dressing: Hydrofera Blue Ready Foam, 4x5 in (Generic) Every Other Day/30 Days ary Discharge Instructions: Apply to wound bed as instructed Prim Dressing: Santyl Ointment Every Other Day/30 Days ary Discharge Instructions: Apply to upper portion of wound only Secondary Dressing: ABD Pad, 5x9 (Generic) Every Other Day/30 Days Discharge Instructions: Apply over primary dressing as directed. Secured With: Elastic Bandage 4 inch (ACE bandage) (Generic) Every Other Day/30 Days Discharge  Instructions: Secure with ACE bandage as directed. Secured With: [employer], 4.5x3.1 (in/yd) (Generic) Every Other Day/30 Days Discharge Instructions: Secure with Kerlix as directed. Secured With: 88M Medipore Public affairs consultant Surgical T 2x10 (in/yd) (Generic) Every Other Day/30 Days ape Discharge Instructions: Secure with tape as directed. Compression Wrap: Elastic 4" Bandage (ACE) Every Other Day/30 Days Discharge Instructions: Apply from base of toes to just below knee Patient Medications llergies: latex, Feraheme, Naprosyn, levothyroxine A Notifications Medication Indication Start End 01/11/2022 gentamicin DOSE topical 0.1 % cream - cream topical applied to the wound be and around the edges of the wound with each dressing change daily x 30 days Electronic Signature(s) Signed: 01/11/2022 10:22:18 AM By: [name] PA-C Entered By: [name] on 01/11/2022 10:22:17 -------------------------------------------------------------------------------- Problem List Details Patient Name: Date of Service: [place], [name] 01/11/2022 9:30 A M Medical Record Number: [id number] Patient Account Number: [id number] Date of Birth/Sex: Treating RN: 03/31/1941 (81 y.o. 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 10/19/2021 No Yes L97.522 Non-pressure chronic ulcer of other part of left foot with fat layer exposed 10/19/2021 No Yes L97.512 Non-pressure chronic ulcer of other part of right foot with fat layer exposed 10/19/2021 No Yes E11.622 Type 2  diabetes mellitus with other skin ulcer 10/19/2021 No Yes Inactive Problems Resolved Problems Electronic Signature(s) Signed: 01/11/2022 10:06:28 AM By: [name] PA-C Entered By: [name] on 01/11/2022 10:06:28 --------------------------------------------------------------------------------  Progress Note Details Patient Name: Date of Service: [place], [place] LD C. 01/11/2022 9:30 A M Medical Record Number: [id number] Patient Account Number: [id number] Date of Birth/Sex: Treating RN: April 30, 1941 (81 y.o. [name] Primary Care Provider: [name] Other Clinician: Referring Provider: Treating Provider/Extender: Azucena Kuba in Treatment: 12 Subjective Chief Complaint Information obtained from Patient Bilateral foot ulcers History of Present Illness (HPI) 81 year old gentleman known to have swelling of his left lower extremity with some ulceration along the medial ankle has been having these problems for the last 3-4 months and does not know how it came on. No history of injury or no history of any infection in this area. He is known to have diabetes mellitus controlled with diet, hyperlipidemia, hypertension and chronic lumbar back pain with sciatica which is being treated by his PCP. Last hemoglobin A1c was 6.3 in June 2017. Last medical history is significant for esophageal stricture, hiatal hernia, vitamin D deficiency, status post back surgery o3, hernia repair as a child for both groins, hiatal hernia repair, joint replacement and revision of a knee surgery on the right side. He has quit smoking in 1982. He has never had a Doppler study of his lower extremity except remotely when he had knee surgery they looked for a blood clot on the right side. 09/13/2016 -- venous reflux study done on 09/12/2016 showed there is evidence of greater saphenous vein reflux in the left lower extremity and the small saphenous vein and the posterior thigh  is not competent. 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 With these results he definitely needs a referral to the vascular surgeons 09/20/2016 -- he has an appointment to see the vascular surgeons on May 1 10/03/2016 -- patient had a 4 layer compression on his left lower extremity and had a lot of discomfort and pain. 10/11/2016-- was seen by Dr. Althea [name] -- review of his data he recommended staged laser ablation of his great and then small saphenous veins for reduction odd of his venous hypertension. He did examine his right leg with the SonoSite and he has dilatation of the saphenous vein on the right lower extremity too. Since there was no evidence of ulceration he recommended observation of the right lower extremity. He recommended to continue with local wound care at the wound center until his wound was completely healed. 10/18/2016 -- the patient has not been very compliant with his compression, his diet and his diuretics and as a result his lymphedema has increased markedly 10/25/2016 -- he has been compliant this week, has worn his 2 press compression wraps, taken his diuretics and is watching his salt intake. 11/08/2016 -- he had his venous procedure done last week and details of this have been reported and reviewed in his electronic medical record. he had a laser ablation of his great saphenous vein and this was done from mid calf to just below the saphenofemoral junction. He would return next week for ultrasound follow-up. He will then undergo the small saphenous vein ablation in a few weeks. 11/15/2016 -- his postprocedure visit showed good closure of his left great saphenous vein from the mid calf to 1-1/2 cm from the saphenofemoral junction. He had excellent early results from the ablation. The ablation of the left small saphenous vein was planned in a week 12/06/2016 -- he had his small saphenous vein venous ablation a week before and the duplex showed closure of  his small saphenous vein  to within half centimeter office saphenous popliteal junction with no DVT and he was pleased with the results. He recommended continue with elevation and compression and to see them back on an as-needed basis 12/19/16; the patient's wound is actually closed. Sometime over the weekend he developed a small abrasion on the lower calf just above the original wound on the left medial malleolus although this is closed as well Readmission: 01/18/18 on evaluation today patient presents after having last been seen in our clinic July 2018. Subsequently he is a reoccurrence of the ulcers on the left ankle both medial and now a new area lateral that had been present back room for about a month he tells me. He states that this has done very well for about a year he really does not know of any injury or anything that happened that would have caused this reopening at this point. He has continued to wear compression stockings which is appropriate. He does not have lymphedema pumps. He has continued to also keep the area clean and dry as best he could. With that being said now that is been reopened is been harder for him to take care of this as far as compression stockings are concerned keeping them clean along with continuing to manage his fluid and swelling. His ABI appears to be great on the left registering at 1.14 today. He has previously undergone a venous ablation. Other than compression/lymphedema pumps he's really done everything that he can to help manage and prevent these ulcers from forming. 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 01/30/18 on evaluation today patient appears to actually be doing very well in regard to his medial ankle ulcer. It's the lateral ulcer that actually is not doing quite as well today. Fortunately he did have some alginate left ovary start using this on the wounds and the medial ankle actually seems to be doing much better. The lateral ankle does actually need  something to con a help with slough and buildup. The collagen really did not seem to do much for him in that regard. 02/13/18 on evaluation today patient appears to be doing about the same in regard to his ulcers. Unfortunately I do not feel like were making good progress at this time. When I have debrided the wound the slough seems to come back fairly quickly in the lateral ankle ulcer. There does not appear to be any evidence of infection at this time. No fevers, chills, nausea, or vomiting noted at this time. 02/27/18 on evaluation today patient actually appears to be showing some signs of improvement in regard to both wound areas. I'm insurance on the medial aspect of his left lower extremity at the ankle whether or not the alginate may be sticking and causing some tearing off of new skin attempting to grow. With that being said the patient states it does not seem to be doing such just pulls up some of the dried dead skin around. Nonetheless this is something I wanted to watch out for going forward and actually I recommended that he take the dressing off in the shower when you could get it completely wet before removal to see how this does. Subsequently in regard to the lateral ankle ulcer he still has slough noted at this point although I do believe that he is tolerating the Medihoney much better I wish you could have gotten the Santyl but unfortunately the Santyl was too expensive gonna cost him $250. 03/06/18 on evaluation today patient appears to be doing  about the same in regard to both ulcers of his left medial and lateral malleolus sites. He's been tolerating the dressing changes without complication. Unfortunately things do not seem to be improving in regard to either side at this time. Overall I think we may need to switch things up a little bit as far as the way we are performing the dressing changes currently. 03/27/18 on evaluation today patient's wound bed actually appears to be doing much  better at both locations in regard to his ankle ulcers. He has been tolerating the dressing changes at this time without complication. Overall I'm very pleased with how things appear. The patient likewise states that he's much better in regard to discomfort at this time. 04/10/18 on evaluation today patient actually appears to be doing well in regard to his left lateral ankle ulcer. He has been tolerating the dressing changes without complication. Fortunately there does not appear to be any evidence of infection. Overall I do feel like he is tolerating the Medihoney without complication. 04/24/18 upon evaluation today patient's wound actually appears to be healed on the lateral aspect of his ankle the medial aspect still continues to remain close without any evidence of complication at this time. Overall I have been extremely pleased with how things have progressed up to this point. The patient likewise is very happy he's not having any significant pain this time. He does wears compression stocking on a regular basis. Readmission: 10-19-2021 upon evaluation today patient presents for evaluation here in the clinic concerning issues with a left lateral ulcer as well as a right lateral ulcer both along the aspect of his foot getting close to the ankle. This left area is the same place that I treated back in 2024. 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 This appears to be very inflamed and is also very painful. In the past he has been completely unable to tolerate the use of any compression wraps unfortunately. There does not appear to be any evidence of active infection locally nor systemically at this time which is good news. I do believe however we need to try to see what we can do to calm down the inflammation I think triamcinolone topically as well as oral prednisone would likely be indicated in this case. Patient's medical history really has not changed  significantly since I last saw him in actually 2019 though I think is stated 2020 above. 10-26-21 upon evaluation today patient appears to still be having a lot of irritation and inflammation in regard to the ankles laterally. The left is greater than the right. With that being said I do believe that he would benefit from a biopsy to confirm whether or not this may indeed be vasculitis. It actually seems like that physically and on examination but again I want to be sure that we are on the right track as far as treatment is concerned. The good news is he did not have a DVT I called him about that on Friday this was all in regard to the right leg. Overall I am very pleased in that regard. 11-02-2021 upon evaluation today patient appears to be doing poorly still in regard to his left lower extremity. Apparently he had a fever on Sunday which was around 101 and he was severely sick feeling and disoriented according to his wife. She came with him today because she did not think that he would actually tell me what was going  on. With that being said unfortunately he did not go to the hospital he actually has been doing a little better over the past few days he been taking amoxicillin he has not taken any other medications orally at this point that are different. He has not been on any antibiotics either more recently other than the amoxicillin. Nonetheless he has not had a repeat in the past 3 days of that issue. 11-09-2021 upon evaluation today patient presents after having had quite an ordeal over the past several days. Subsequently yesterday I got a call from [place] here in [place] concerning the patient and the fact that he was having bleeding that been going on for the past 24 hours and was not stopping. Subsequently my advice was to have the patient go to the ER for further evaluation and treatment. It seems to me that he had a regular way part of the wound down to the point that a blood vessel and open.  Apparently he was filling up trash bags wrapped around his foot with blood. In fact his lab review which I did do him as well today showed that he went from a white hemoglobin on 09-14-2021 of 13.6 down to a hemoglobin of 11.2 on 11-08-2021 this was yesterday. Subsequently this is due to the amount of blood loss that he has had acutely. Again I do not believe he is at transfusion stage but at the same time he is extremely weak and states that a couple times when he is going to stand up he is actually fallen back into his chair. Nonetheless I do believe that he probably needs to supplement with iron and I did recommend that there are some over-the-counter products he could get in this regard. Subsequently also suggested that the patient needs to be drinking plenty of water he apparently drinks Coke and he drinks coffee but that is about it. Subsequently following this conversation his wife was present during this time as well and I do think that he is going to try to drink more I think it is of utmost importance. With that being said I did review his PCR culture as well it was positive for 2 organisms. This was Pseudomonas and Enterobacter. Both were showing up as being very prevalent in the PCR culture and subsequently Cipro will take care of the situation which is what I am recommending at this point based on what we see. This will take the place of the Bactrim and can have him discontinue the Bactrim the only thing is he is getting need to stop taking the hydroxyzine if he has been taking it that is the Vistaril and he tells me that he only takes this when he itches he has not been taking it recently. 6/7; patient arrives in clinic today with the wound on the right foot actually looking some better the area on the left lateral looking about the same. Problematically, he is having edema fluid leakage on the medial part of the left foot and ankle causing skin breakdown but no open wound. He has been  very resistant to the notion of compression wraps. He has been using Santyl on both wound areas at home changing the dressing himself Partway through our visit today it became clear he had had a fall on Sunday 3 days ago. Since then he has had a lot of problems with his right shoulder 11-23-2021 upon evaluation today patient appears to be doing well with regard to his wound. Has been tolerating  dressing changes without complication. Fortunately there does not appear to be any signs of infection he was wrapped last week when Dr. Dellia Nims saw him on the left. He was having some breakdown medially which they were concerned about. He did keep the wrap on until Monday but states it got extremely wet. For that reason I am thinking of doing a nurse visit on Friday so that we can get some of the swelling down right now by Friday we should have a lot of that out that we can switch out and put on a fresh dressing to get him through till next Wednesday. He is in agreement with giving that a try. 11-30-2021 upon evaluation today patient actually appears to be doing better with regard to the overall appearance of his wound. Fortunately there does not appear to be any signs of active infection locally or systemically which is great news. With that being said he has been tolerating the dressing changes without complication other than the fact that from Wednesday to Friday he did well with the compression wrap from Friday till yesterday he tells me the drainage was so bad and the smell was so bad he had to take this off. It does appear that he is very macerated around the lower portion down around the heel and I do think that this is an issue. Unfortunately we cannot get home health to help with this currently. 12-07-2021 upon evaluation today patient appears to be doing well currently in regard to his wound. He is actually showing signs of excellent improvement which is great news. I am very pleased with where things  stand. I do think that the progress is being made. With that being said the patient is tolerating the Santyl quite well and I think is doing an awesome job for him. 12-14-2021 upon evaluation today patient appears to be doing well with regard to his wounds he is making progress is still draining a lot however which I completely understand. With that being said fortunately there does not appear to be any signs of active infection locally or systemically at this time. 12-21-2021 upon evaluation today patient appears to be doing well currently in regard to his wound. He is actually showing signs of significant improvement this is slow but nonetheless week by week we are seeing this improve on the lateral aspect of his foot. The measurements are little bit better but more importantly the wound surface is actually a whole lot cleaner. The patient is pleased to hear this. With that being said he tells me that it is really hard for him to see exactly what all is going on. Nonetheless I am very pleased with all the new red tissue that were seeing granulating and hopefully we get this clean shortly and we can even switch to a different dressing to try to get it to close. 12-28-2021 upon evaluation today patient appears to be doing well currently in regard to his wound is actually showing signs of improvement we are going to perform some debridement today to clearway some of the necrotic debris. Overall I think that we are at the point where we do not have to use Santyl over the home wound I think using it just on the upper portion may be appropriate if we can get this completely cleaned away. 7/6; substantial wound on the left lateral foot. Using Santyl and Hydrofera Blue there is been good improvement since the last time I saw this. The patient does not tolerate compression  wraps therefore he is using Ace wraps. Fortunately he appears to be doing this fairly well 01-11-22 upon evaluation today patient appears to be  doing better in regard to his wound in general though he is having some issues here with blue-green drainage. I am going to go ahead and see about getting him sent in a prescription for gentamicin cream. Objective Constitutional Well-nourished and well-hydrated in no acute distress. Vitals Time Taken: 9:28 AM, Height: 66 in, Weight: 184 lbs, BMI: 29.7, Temperature: 98.0 F, Pulse: 63 bpm, Respiratory Rate: 18 breaths/min, Blood Pressure: 161/83 mmHg. Respiratory normal breathing without difficulty. Psychiatric this patient is able to make decisions and demonstrates good insight into disease process. Alert and Oriented x 3. pleasant and cooperative. General Notes: Upon inspection patient's wound bed actually showed signs of good granulation and epithelization at this point. Fortunately I do not see any evidence of active infection locally or systemically which is great news and overall I am extremely pleased in that regard. I do think that with the blue-green drainage that he may benefit from some topical gentamicin which I will go ahead and send into the pharmacy as well. Integumentary (Hair, Skin) Wound #6 status is Open. Original cause of wound was Gradually Appeared. The date acquired was: 07/25/2021. The wound has been in treatment 12 weeks. The wound is located on the Left,Lateral Foot. The wound measures 5.9cm length x 3.8cm width x 0.2cm depth; 17.609cm^2 area and 3.522cm^3 volume. There is Fat Layer (Subcutaneous Tissue) exposed. There is no tunneling or undermining noted. There is a large amount of serosanguineous drainage noted. The wound margin is distinct with the outline attached to the wound base. There is large (67-100%) red, pink, hyper - granulation within the wound bed. There is a small (1-33%) amount of necrotic tissue within the wound bed including Adherent Slough. 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 There was a Chemical/Enzymatic/Mechanical debridement performed by [name], PA. after achieving pain control using Lidocaine 5% topical ointment. Agent used was [employer]. A time out was conducted at 10:10, prior to the start of the procedure. There was no bleeding. The procedure was tolerated well. Post Debridement Measurements: 5.9cm length x 3.8cm width x 0.2cm depth; 3.522cm^3 volume. Character of Wound/Ulcer Post Debridement is stable. Post procedure Diagnosis Wound #6: Same as Pre-Procedure Plan Follow-up Appointments: Return Appointment in 1 week. - 01/18/22 @ 9:30AM with Bernette Redbird, Room 7) Return Appointment in 2 weeks. - 01/25/22 @ 8:45AM Cellular or Tissue Based Products: Cellular or Tissue Based Product Type: - Will run IVR for Apligraf Bathing/ Shower/ Hygiene: May shower and wash wound with soap and water. - wash with antibacterial soap when changing dressing Edema Control - Lymphedema / SCD / Other: Elevate legs to the level of the heart or above for 30 minutes daily and/or when sitting, a frequency of: - throughout the day Patient to wear own compression stockings every day. - Use stocking daily to right leg Moisturize legs daily. - Eucerin (in jar) Additional Orders / Instructions: Follow Nutritious Diet Non Wound Condition: Apply the following to affected  area as directed: - Apply ABD pad to left medial leg The following medication(s) was prescribed: gentamicin topical 0.1 % cream cream topical applied to the wound be and around the edges of the wound with  each dressing change daily x 30 days starting 01/11/2022 WOUND #6: - Foot Wound Laterality: Left, Lateral Cleanser: Soap and Water Every Other Day/30 Days Discharge Instructions: May shower and wash wound with dial antibacterial soap and water prior to dressing change. Peri-Wound Care: Zinc Oxide Ointment 30g tube Every Other Day/30 Days Discharge Instructions: Apply Zinc Oxide to periwound with each dressing change Topical: Gentamicin Every Other Day/30 Days Discharge Instructions: Apply over entire wound and around edges Prim Dressing: Hydrofera Blue Ready Foam, 4x5 in (Generic) Every Other Day/30 Days ary Discharge Instructions: Apply to wound bed as instructed Prim Dressing: Santyl Ointment Every Other Day/30 Days ary Discharge Instructions: Apply to upper portion of wound only Secondary Dressing: ABD Pad, 5x9 (Generic) Every Other Day/30 Days Discharge Instructions: Apply over primary dressing as directed. Secured With: Elastic Bandage 4 inch (ACE bandage) (Generic) Every Other Day/30 Days Discharge Instructions: Secure with ACE bandage as directed. Secured With: [employer], 4.5x3.1 (in/yd) (Generic) Every Other Day/30 Days Discharge Instructions: Secure with Kerlix as directed. Secured With: 62M Medipore Public affairs consultant Surgical T 2x10 (in/yd) (Generic) Every Other Day/30 Days ape Discharge Instructions: Secure with tape as directed. Com pression Wrap: Elastic 4" Bandage (ACE) Every Other Day/30 Days Discharge Instructions: Apply from base of toes to just below knee 1. I would recommend currently that we going continue with wound care measures as before and the patient is in agreement with plan this includes the use of the [employer] [facility] he is using Santyl just on a couple areas. 2. I am going to send in a prescription for gentamicin cream to be used topically on and around the wound in order to keep the drainage under control and I think that there could be some issues here with  Pseudomonas in the wound bed that she has taken out that may be causing some increased drainage as well. The patient is in agreement with this plan. 3. Also can recommend that we have the patient continue with the [facility] over top of this and he will use Santyl on any areas that still need some cleaning. We will see patient back for reevaluation in 1 week here in the clinic. If anything worsens or changes patient will contact our office for additional recommendations. Electronic Signature(s) Signed: 01/11/2022 10:22:50 AM By: [name] PA-C Entered By: [name] on 01/11/2022 10:22:50 -------------------------------------------------------------------------------- SuperBill Details Patient Name: Date of Service: [place], [name] 01/11/2022 Medical Record Number: [id number] Patient Account Number: [id number] Date of Birth/Sex: Treating RN: August 25, 1940 (81 y.o. 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 layer exposed E11.622 Type 2 diabetes mellitus with other skin ulcer Facility Procedures CPT4 Code: [id number] Description: [id number] - DEBRIDE W/O ANES NON SELECT ICD-10 Diagnosis Description L97.522 Non-pressure chronic ulcer of other part of left foot with fat layer exposed Modifier: Quantity: 1  Physician Procedures : CPT4 Code Description Modifier [id number] - WC PHYS LEVEL 4 - EST PT ICD-10 Diagnosis Description I87.333 Chronic venous  hypertension (idiopathic) with ulcer and inflammation of bilateral lower extremity I89.0 Lymphedema, not elsewhere classified  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

## 2022-01-17 ENCOUNTER — Ambulatory Visit: Payer: Self-pay

## 2022-01-18 ENCOUNTER — Encounter (HOSPITAL_BASED_OUTPATIENT_CLINIC_OR_DEPARTMENT_OTHER): Payer: Medicare HMO | Admitting: Physician Assistant

## 2022-01-18 DIAGNOSIS — E11621 Type 2 diabetes mellitus with foot ulcer: Secondary | ICD-10-CM | POA: Diagnosis not present

## 2022-01-18 DIAGNOSIS — I87333 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 extremity: Secondary | ICD-10-CM | POA: Diagnosis not present

## 2022-01-18 DIAGNOSIS — I890 Lymphedema, not elsewhere classified: Secondary | ICD-10-CM | POA: Diagnosis not present

## 2022-01-18 DIAGNOSIS — L97512 Non-pressure chronic ulcer of other part of right foot with fat layer exposed: Secondary | ICD-10-CM | POA: Diagnosis not present

## 2022-01-18 DIAGNOSIS — G8929 Other chronic pain: Secondary | ICD-10-CM | POA: Diagnosis not present

## 2022-01-18 DIAGNOSIS — I10 Essential (primary) hypertension: Secondary | ICD-10-CM | POA: Diagnosis not present

## 2022-01-18 DIAGNOSIS — E785 Hyperlipidemia, unspecified: Secondary | ICD-10-CM | POA: Diagnosis not present

## 2022-01-18 DIAGNOSIS — L97522 Non-pressure chronic ulcer of other part of left foot with fat layer exposed: Secondary | ICD-10-CM | POA: Diagnosis not present

## 2022-01-18 DIAGNOSIS — L958 Other vasculitis limited to the skin: Secondary | ICD-10-CM | POA: Diagnosis not present

## 2022-01-18 NOTE — Progress Notes (Addendum)
KUTLER, VANVRANKEN ([id number]) Visit Report for 01/18/2022 Arrival Information Details Patient Name: Date of Service: [place], [name] 01/18/2022 9:30 A M Medical Record Number: [id number] Patient Account Number: [id number] Date of Birth/Sex: Treating RN: April 18, 1941 (81 y.o. [name] Primary Care Benjimin Hadden: [name] Other Clinician: Referring Brixton Franko: Treating Yeraldin Litzenberger/Extender: Azucena Kuba in Treatment: 13 Visit Information History Since Last Visit Added or deleted any medications: No Patient Arrived: Ambulatory Any new allergies or adverse reactions: No Arrival Time: 10:05 Had a fall or experienced change in No Transfer Assistance: None activities of daily living that may affect Patient Identification Verified: Yes risk of falls: Secondary Verification Process Completed: Yes Signs or symptoms of abuse/neglect since last visito No Patient Requires Transmission-Based Precautions: No Hospitalized since last visit: No Patient Has Alerts: No Implantable device outside of the clinic excluding No cellular tissue based products placed in the center since last visit: Has Dressing in Place as Prescribed: Yes Has Compression in Place as Prescribed: Yes Pain Present Now: Yes Electronic Signature(s) Signed: 01/18/2022 5:34:04 PM By: [name] Entered By: [name] on 01/18/2022 10:06:31 -------------------------------------------------------------------------------- Encounter Discharge Information Details Patient Name: Date of Service: [place], [name] 01/18/2022 9:30 A M Medical Record Number: [id number] Patient Account Number: [id number] Date of Birth/Sex: Treating RN: 1941/05/25 (81 y.o. [name] Primary Care Auri Jahnke: [name] Other Clinician: Referring Elandra Powell: Treating Zoeya Gramajo/Extender: Azucena Kuba in Treatment: 13 Encounter Discharge Information Items Post Procedure  Vitals Discharge Condition: Stable Temperature (F): 98.3 Ambulatory Status: Ambulatory Pulse (bpm): 59 Discharge Destination: Home Respiratory Rate (breaths/min): 18 Transportation: Private Auto Blood Pressure (mmHg): 149/84 Schedule Follow-up Appointment: Yes Clinical Summary of Care: Provided on 01/18/2022 Form Type Recipient Paper Patient Patient Electronic Signature(s) Signed: 01/18/2022 5:34:04 PM By: [name] Entered By: [name] on 01/18/2022 10:59:18 -------------------------------------------------------------------------------- Lower Extremity Assessment Details Patient Name: Date of Service: [place], [name] 01/18/2022 9:30 A M Medical Record Number: [id number] Patient Account Number: [id number] Date of Birth/Sex: Treating RN: 1941-03-30 (81 y.o. [name] Primary Care Markea Ruzich: [name] Other Clinician: Referring Savilla Turbyfill: Treating Hamlet Lasecki/Extender: Azucena Kuba in Treatment: 13 Edema Assessment Assessed: [Left: Yes] Patrice Paradise: No] Edema: [Left: Ye] [Right: s] Calf Left: Right: Point of Measurement: 32 cm From Medial Instep 40.5 cm Ankle Left: Right: Point of Measurement: 10 cm From Medial Instep 28 cm Vascular Assessment Pulses: Dorsalis Pedis Palpable: [Left:Yes] Electronic Signature(s) Signed: 01/18/2022 5:34:04 PM By: [name] Entered By: [name] on 01/18/2022 10:14:06 -------------------------------------------------------------------------------- Multi Wound Chart Details Patient Name: Date of Service: [name], [name] 01/18/2022 9:30 A M Medical Record Number: [id number] Patient Account Number: [id number] Date of Birth/Sex: Treating RN: November 16, 1940 (81 y.o. [name] Primary Care Annelies Coyt: [name] Other Clinician: Referring Alyrica Thurow: Treating Saori Umholtz/Extender: Azucena Kuba in Treatment: 13 Vital Signs Height(in): 49 Pulse(bpm):  18 Weight(lbs): 184 Blood Pressure(mmHg): 149/84 Body Mass Index(BMI): 29.7 Temperature(F): 98.3 Respiratory Rate(breaths/min): 18 Photos: [6:Left, Lateral Foot] [N/A:N/A N/A] Wound Location: [6:Gradually Appeared] [N/A:N/A] Wounding Event: [6:Vasculitis] [N/A:N/A] Primary Etiology: [6:Anemia, Chronic Obstructive] [N/A:N/A] Comorbid History: [6:Pulmonary Disease (COPD), Hypertension, Peripheral Venous Disease, Type II Diabetes, Osteoarthritis, Neuropathy 07/25/2021] [N/A:N/A] Date Acquired: [6:13] [N/A:N/A] Weeks of Treatment: [6:Open] [N/A:N/A] Wound Status: [6:No] [N/A:N/A] Wound Recurrence: [6:5.5x3.5x0.2] [N/A:N/A] Measurements L x W x D (cm) [6:15.119] [N/A:N/A] A (cm) : rea [6:3.024] [N/A:N/A] Volume (cm) : [6:22.40%] [N/A:N/A] %  Reduction in A [6:rea: 22.40%] [N/A:N/A] % Reduction in Volume: [6:Full Thickness Without Exposed] [N/A:N/A] Classification: [6:Support Structures Large] [N/A:N/A] Exudate A mount: [6:Serosanguineous] [N/A:N/A] Exudate Type: [6:red, brown] [N/A:N/A] Exudate Color: [6:Distinct, outline attached] [N/A:N/A] Wound Margin: [6:Medium (34-66%)] [N/A:N/A] Granulation A mount: [6:Red, Pink, Hyper-granulation] [N/A:N/A] Granulation Quality: [6:Medium (34-66%)] [N/A:N/A] Necrotic A mount: [6:Fat Layer (Subcutaneous Tissue): Yes N/A] Exposed Structures: [6:Fascia: No Tendon: No Muscle: No Joint: No Bone: No Small (1-33%)] [N/A:N/A] Epithelialization: [6:Chemical/Enzymatic/Mechanical] [N/A:N/A] Debridement: Pre-procedure Verification/Time Out 10:40 [N/A:N/A] Taken: [6:Lidocaine 4% Topical Solution] [N/A:N/A] Pain Control: [6:N/A] [N/A:N/A] Instrument: [6:None] [N/A:N/A] Bleeding: [6:Procedure was tolerated well] [N/A:N/A] Debridement Treatment Response: [6:5.5x3.5x0.2] [N/A:N/A] Post Debridement Measurements L x W x D (cm) [6:3.024] [N/A:N/A] Post Debridement Volume: (cm) [6:Debridement] [N/A:N/A] Treatment Notes Wound #6 (Foot) Wound Laterality:  Left, Lateral Cleanser Soap and Water Discharge Instruction: May shower and wash wound with dial antibacterial soap and water prior to dressing change. Peri-Wound Care Zinc Oxide Ointment 30g tube Discharge Instruction: Apply Zinc Oxide to periwound with each dressing change Topical Gentamicin Discharge Instruction: Apply over entire wound and around edges Primary Dressing Hydrofera Blue Ready Foam, 4x5 in Discharge Instruction: Apply to wound bed as instructed Santyl Ointment Discharge Instruction: Apply to upper portion of wound only Secondary Dressing ABD Pad, 5x9 Discharge Instruction: Apply over primary dressing as directed. Secured With Elastic Bandage 4 inch (ACE bandage) Discharge Instruction: Secure with ACE bandage as directed. Kerlix Roll Sterile, 4.5x3.1 (in/yd) Discharge Instruction: Secure with Kerlix as directed. 37M Medipore Soft Cloth Surgical T 2x10 (in/yd) ape Discharge Instruction: Secure with tape as directed. Compression Wrap Elastic 4" Bandage (ACE) Discharge Instruction: Apply from base of toes to just below knee Compression Stockings Add-Ons Electronic Signature(s) Signed: 01/18/2022 5:17:12 PM By: [name] Entered By: [name] on 01/18/2022 17:17:12 -------------------------------------------------------------------------------- Multi-Disciplinary Care Plan Details Patient Name: Date of Service: [place], [name] 01/18/2022 9:30 A M Medical Record Number: [id number] Patient Account Number: [id number] Date of Birth/Sex: Treating RN: 10-17-1940 (81 y.o. [name] Primary Care Nakya Weyand: [name] Other Clinician: Referring Makiya Jeune: Treating Daelen Belvedere/Extender: Azucena Kuba in Treatment: 13 Active Inactive Venous Leg Ulcer Nursing Diagnoses: Actual venous Insuffiency (use after diagnosis is confirmed) Goals: Patient will maintain optimal edema control Date Initiated: 10/19/2021 Target  Resolution Date: 02/08/2022 Goal Status: Active Interventions: Assess peripheral edema status every visit. Compression as ordered Notes: 11/16/21: Edema not controlled, patient not compliant with compression. 12/14/21:Edema not well controlled, patient will not allow compression wraps. Wound/Skin Impairment Nursing Diagnoses: Impaired tissue integrity Goals: Patient/caregiver will verbalize understanding of skin care regimen Date Initiated: 10/19/2021 Target Resolution Date: 02/08/2022 Goal Status: Active Ulcer/skin breakdown will have a volume reduction of 30% by week 4 Date Initiated: 10/19/2021 Date Inactivated: 12/14/2021 Target Resolution Date: 12/14/2021 Unmet Reason: infection, non Goal Status: Unmet compliance Interventions: Assess patient/caregiver ability to obtain necessary supplies Assess patient/caregiver ability to perform ulcer/skin care regimen upon admission and as needed Assess ulceration(s) every visit Provide education on ulcer and skin care Treatment Activities: Topical wound management initiated : 10/19/2021 Notes: 11/16/21: Wound care continues, patient not compliant with edema control. Electronic Signature(s) Signed: 01/18/2022 9:26:50 AM By: [name] Entered By: [name] on 01/18/2022 09:26:49 -------------------------------------------------------------------------------- Pain Assessment Details Patient Name: Date of Service: [name], [name] 01/18/2022 9:30 A M Medical Record Number: [id number] Patient Account Number: [id number] Date of Birth/Sex: Treating RN: November 24, 1940 (81 y.o. [name] Primary Care Saudia Smyser: [name] Other Clinician: Referring Masyn Rostro:  Treating Mehar Kirkwood/Extender: Azucena Kuba in Treatment: 13 Active Problems Location of Pain Severity and Description of Pain Patient Has Paino No Site Locations Pain Management and Medication Current Pain Management: Electronic  Signature(s) Signed: 01/18/2022 5:34:04 PM By: [name] Entered By: [name] on 01/18/2022 10:11:40 -------------------------------------------------------------------------------- Patient/Caregiver Education Details Patient Name: Date of Service: [place] [name] 8/9/2023andnbsp9:30 A M Medical Record Number: [id number] Patient Account Number: [id number] Date of Birth/Gender: Treating RN: [birth year] (81 y.o. [name] Primary Care Physician: [name] Other Clinician: Referring Physician: Treating Physician/Extender: Azucena Kuba in Treatment: 13 Education Assessment Education Provided To: Patient Education Topics Provided Venous: Methods: Explain/Verbal, Printed Responses: State content correctly Wound/Skin Impairment: Methods: Demonstration, Explain/Verbal, Printed Responses: State content correctly Electronic Signature(s) Signed: 01/18/2022 5:34:04 PM By: [name] Entered By: [name] on 01/18/2022 09:27:09 -------------------------------------------------------------------------------- Wound Assessment Details Patient Name: Date of Service: [name], [name] 01/18/2022 9:30 A M Medical Record Number: [id number] Patient Account Number: [id number] Date of Birth/Sex: Treating RN: 1940/07/18 (81 y.o. [name] Primary Care Esaul Dorwart: [name] Other Clinician: Referring Honestee Revard: Treating Estelle Greenleaf/Extender: Azucena Kuba in Treatment: 13 Wound Status Wound Number: 6 Primary Vasculitis Etiology: Wound Location: Left, Lateral Foot Wound Open Wounding Event: Gradually Appeared Status: Date Acquired: 07/25/2021 Comorbid Anemia, Chronic Obstructive Pulmonary Disease (COPD), Weeks Of Treatment: 13 History: Hypertension, Peripheral Venous Disease, Type II Diabetes, Clustered Wound: No Osteoarthritis, Neuropathy Photos Wound Measurements Length: (cm) 5.5 Width:  (cm) 3.5 Depth: (cm) 0.2 Area: (cm) 15.119 Volume: (cm) 3.024 % Reduction in Area: 22.4% % Reduction in Volume: 22.4% Epithelialization: Small (1-33%) Tunneling: No Undermining: No Wound Description Classification: Full Thickness Without Exposed Support Structures Wound Margin: Distinct, outline attached Exudate Amount: Large Exudate Type: Serosanguineous Exudate Color: red, brown Foul Odor After Cleansing: No Slough/Fibrino Yes Wound Bed Granulation Amount: Medium (34-66%) Exposed Structure Granulation Quality: Red, Pink, Hyper-granulation Fascia Exposed: No Necrotic Amount: Medium (34-66%) Fat Layer (Subcutaneous Tissue) Exposed: Yes Necrotic Quality: Adherent Slough Tendon Exposed: No Muscle Exposed: No Joint Exposed: No Bone Exposed: No Treatment Notes Wound #6 (Foot) Wound Laterality: Left, Lateral Cleanser Soap and Water Discharge Instruction: May shower and wash wound with dial antibacterial soap and water prior to dressing change. Peri-Wound Care Zinc Oxide Ointment 30g tube Discharge Instruction: Apply Zinc Oxide to periwound with each dressing change Topical Gentamicin Discharge Instruction: Apply over entire wound and around edges Primary Dressing Hydrofera Blue Ready Foam, 4x5 in Discharge Instruction: Apply to wound bed as instructed Santyl Ointment Discharge Instruction: Apply to upper portion of wound only Secondary Dressing ABD Pad, 5x9 Discharge Instruction: Apply over primary dressing as directed. Secured With Elastic Bandage 4 inch (ACE bandage) Discharge Instruction: Secure with ACE bandage as directed. Kerlix Roll Sterile, 4.5x3.1 (in/yd) Discharge Instruction: Secure with Kerlix as directed. 36M Medipore Soft Cloth Surgical T 2x10 (in/yd) ape Discharge Instruction: Secure with tape as directed. Compression Wrap Elastic 4" Bandage (ACE) Discharge Instruction: Apply from base of toes to just below knee Compression  Stockings Add-Ons Electronic Signature(s) Signed: 01/18/2022 5:34:04 PM By: [name] Entered By: [name] on 01/18/2022 10:15:34 -------------------------------------------------------------------------------- Vitals Details Patient Name: Date of Service: [name], [name] 01/18/2022 9:30 A M Medical Record Number: [id number] Patient Account Number: [id number] Date of Birth/Sex: Treating RN: 01-02-1941 (81 y.o. [name] Primary Care Alphonse Asbridge: [name] Other Clinician: Referring Karen Kinnard: Treating Yolani Vo/Extender: Erling Conte,  Lendon Ka Weeks in Treatment: 13 Vital Signs Time Taken: 10:06 Temperature (F): 98.3 Height (in): 66 Pulse (bpm): 59 Weight (lbs): 184 Respiratory Rate (breaths/min): 18 Body Mass Index (BMI): 29.7 Blood Pressure (mmHg): 149/84 Reference Range: 80 - 120 mg / dl Electronic Signature(s) Signed: 01/18/2022 5:34:04 PM By: [name] Entered By: [name] on 01/18/2022 10:08:15

## 2022-01-18 NOTE — Progress Notes (Addendum)
[name] Fernandez, PETITE ([id number]) Visit Report for 01/18/2022 Chief Complaint Document Details Patient Name: Date of Service: [place], [name] 01/18/2022 9:30 A M Medical Record Number: [id number] Patient Account Number: [id number] Date of Birth/Sex: Treating RN: [birth year] (81 y.o. M) Primary Care Provider: [name] Other Clinician: Referring Provider: Treating Provider/Extender: Azucena Kuba in Treatment: 13 Information Obtained from: Patient Chief Complaint Bilateral foot ulcers Electronic Signature(s) Signed: 01/18/2022 9:45:48 AM By: [name] PA-C Entered By: [name] on 01/18/2022 09:45:48 -------------------------------------------------------------------------------- HPI Details Patient Name: Date of Service: [place], [name] 01/18/2022 9:30 A M Medical Record Number: [id number] Patient Account Number: [id number] Date of Birth/Sex: Treating RN: 07-13-1940 (81 y.o. M) Primary Care Provider: [name] Other Clinician: Referring Provider: Treating Provider/Extender: Azucena Kuba in Treatment: 13 History of Present Illness HPI Description: 81 year old gentleman known to have swelling of his left lower extremity with some ulceration along the medial ankle has been having these problems for the last 3-4 months and does not know how it came on. No history of injury or no history of any infection in this area. He is known to have diabetes mellitus controlled with diet, hyperlipidemia, hypertension and chronic lumbar back pain with sciatica which is being treated by his PCP. Last hemoglobin A1c was 6.3 in June 2017. Last medical history is significant for esophageal stricture, hiatal hernia, vitamin D deficiency, status post back surgery 3, hernia repair as a child for both groins, hiatal hernia repair, joint replacement and revision of a knee surgery on the right side. He has quit smoking in 1982. He has  never had a Doppler study of his lower extremity except remotely when he had knee surgery they looked for a blood clot on the right side. 09/13/2016 -- venous reflux study done on 09/12/2016 showed there is evidence of greater saphenous vein reflux in the left lower extremity and the small saphenous vein and the posterior thigh is not competent. 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 With these results he definitely needs a referral to the vascular surgeons 09/20/2016 -- he has an appointment to see the vascular surgeons on May 1 10/03/2016 -- patient had a 4 layer compression on his left lower extremity and had a lot of discomfort and pain. 10/11/2016-- was seen by Dr. Althea [name] -- review of his data he recommended staged laser ablation of his great and then small saphenous veins for reduction odd of his venous hypertension. He did examine his right leg with the SonoSite and he has dilatation of the saphenous vein on the right lower extremity too. Since there was no evidence of ulceration he recommended observation of the right lower extremity. He recommended to continue with local wound care at the wound center until his wound was completely healed. 10/18/2016 -- the patient has not been very compliant with his compression, his diet and his diuretics and as a result his lymphedema has increased markedly 10/25/2016 -- he has been compliant this week, has worn his 2 press compression wraps, taken his diuretics and is watching his salt intake. 11/08/2016 -- he had his venous procedure done last week and details of this have been reported and reviewed in his electronic medical record. he had a laser ablation of his great saphenous vein and this was done from mid calf to just below the saphenofemoral junction. He would return next week for ultrasound follow-up. He  will then undergo the small saphenous vein ablation in a few weeks. 11/15/2016 -- his postprocedure visit showed good  closure of his left great saphenous vein from the mid calf to 1-1/2 cm from the saphenofemoral junction. He had excellent early results from the ablation. The ablation of the left small saphenous vein was planned in a week 12/06/2016 -- he had his small saphenous vein venous ablation a week before and the duplex showed closure of his small saphenous vein to within half centimeter office saphenous popliteal junction with no DVT and he was pleased with the results. He recommended continue with elevation and compression and to see them back on an as-needed basis 12/19/16; the patient's wound is actually closed. Sometime over the weekend he developed a small abrasion on the lower calf just above the original wound on the left medial malleolus although this is closed as well Readmission: 01/18/18 on evaluation today patient presents after having last been seen in our clinic July 2018. Subsequently he is a reoccurrence of the ulcers on the left ankle both medial and now a new area lateral that had been present back room for about a month he tells me. He states that this has done very well for about a year he really does not know of any injury or anything that happened that would have caused this reopening at this point. He has continued to wear compression stockings which is appropriate. He does not have lymphedema pumps. He has continued to also keep the area clean and dry as best he could. With that being said now that is been reopened is been harder for him to take care of this as far as compression stockings are concerned keeping them clean along with continuing to manage his fluid and swelling. His ABI appears to be great on the left registering at 1.14 today. He has previously undergone a venous ablation. Other than compression/lymphedema pumps he's really done everything that he can to help manage and prevent these ulcers from forming. 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 01/30/18 on evaluation today  patient appears to actually be doing very well in regard to his medial ankle ulcer. It's the lateral ulcer that actually is not doing quite as well today. Fortunately he did have some alginate left ovary start using this on the wounds and the medial ankle actually seems to be doing much better. The lateral ankle does actually need something to con a help with slough and buildup. The collagen really did not seem to do much for him in that regard. 02/13/18 on evaluation today patient appears to be doing about the same in regard to his ulcers. Unfortunately I do not feel like were making good progress at this time. When I have debrided the wound the slough seems to come back fairly quickly in the lateral ankle ulcer. There does not appear to be any evidence of infection at this time. No fevers, chills, nausea, or vomiting noted at this time. 02/27/18 on evaluation today patient actually appears to be showing some signs of improvement in regard to both wound areas. I'm insurance on the medial aspect of his left lower extremity at the ankle whether or not the alginate may be sticking and causing some tearing off of new skin attempting to grow. With that being said the patient states it does not seem to be doing such just pulls up some of the dried dead skin around. Nonetheless this is something I wanted to watch out for going forward and  actually I recommended that he take the dressing off in the shower when you could get it completely wet before removal to see how this does. Subsequently in regard to the lateral ankle ulcer he still has slough noted at this point although I do believe that he is tolerating the Medihoney much better I wish you could have gotten the Santyl but unfortunately the Santyl was too expensive gonna cost him $250. 03/06/18 on evaluation today patient appears to be doing about the same in regard to both ulcers of his left medial and lateral malleolus sites. He's been tolerating the  dressing changes without complication. Unfortunately things do not seem to be improving in regard to either side at this time. Overall I think we may need to switch things up a little bit as far as the way we are performing the dressing changes currently. 03/27/18 on evaluation today patient's wound bed actually appears to be doing much better at both locations in regard to his ankle ulcers. He has been tolerating the dressing changes at this time without complication. Overall I'm very pleased with how things appear. The patient likewise states that he's much better in regard to discomfort at this time. 04/10/18 on evaluation today patient actually appears to be doing well in regard to his left lateral ankle ulcer. He has been tolerating the dressing changes without complication. Fortunately there does not appear to be any evidence of infection. Overall I do feel like he is tolerating the Medihoney without complication. 04/24/18 upon evaluation today patient's wound actually appears to be healed on the lateral aspect of his ankle the medial aspect still continues to remain close without any evidence of complication at this time. Overall I have been extremely pleased with how things have progressed up to this point. The patient likewise is very happy he's not having any significant pain this time. He does wears compression stocking on a regular basis. Readmission: 10-19-2021 upon evaluation today patient presents for evaluation here in the clinic concerning issues with a left lateral ulcer as well as a right lateral ulcer both along the aspect of his foot getting close to the ankle. This left area is the same place that I treated back in 2024. 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 This appears to be very inflamed and is also very painful. In the past he has been completely unable to tolerate the use of any compression wraps unfortunately. There does not  appear to be any evidence of active infection locally nor systemically at this time which is good news. I do believe however we need to try to see what we can do to calm down the inflammation I think triamcinolone topically as well as oral prednisone would likely be indicated in this case. Patient's medical history really has not changed significantly since I last saw him in actually 2019 though I think is stated 2020 above. 10-26-21 upon evaluation today patient appears to still be having a lot of irritation and inflammation in regard to the ankles laterally. The left is greater than the right. With that being said I do believe that he would benefit from a biopsy to confirm whether or not this may indeed be vasculitis. It actually seems like that physically and on examination but again I want to be sure that we are on the right track as far as treatment is concerned. The good news is he did not have a DVT I called  him about that on Friday this was all in regard to the right leg. Overall I am very pleased in that regard. 11-02-2021 upon evaluation today patient appears to be doing poorly still in regard to his left lower extremity. Apparently he had a fever on Sunday which was around 101 and he was severely sick feeling and disoriented according to his wife. She came with him today because she did not think that he would actually tell me what was going on. With that being said unfortunately he did not go to the hospital he actually has been doing a little better over the past few days he been taking amoxicillin he has not taken any other medications orally at this point that are different. He has not been on any antibiotics either more recently other than the amoxicillin. Nonetheless he has not had a repeat in the past 3 days of that issue. 11-09-2021 upon evaluation today patient presents after having had quite an ordeal over the past several days. Subsequently yesterday I got a call from [place] here in  [place] concerning the patient and the fact that he was having bleeding that been going on for the past 24 hours and was not stopping. Subsequently my advice was to have the patient go to the ER for further evaluation and treatment. It seems to me that he had a regular way part of the wound down to the point that a blood vessel and open. Apparently he was filling up trash bags wrapped around his foot with blood. In fact his lab review which I did do him as well today showed that he went from a white hemoglobin on 09-14-2021 of 13.6 down to a hemoglobin of 11.2 on 11-08-2021 this was yesterday. Subsequently this is due to the amount of blood loss that he has had acutely. Again I do not believe he is at transfusion stage but at the same time he is extremely weak and states that a couple times when he is going to stand up he is actually fallen back into his chair. Nonetheless I do believe that he probably needs to supplement with iron and I did recommend that there are some over-the-counter products he could get in this regard. Subsequently also suggested that the patient needs to be drinking plenty of water he apparently drinks Coke and he drinks coffee but that is about it. Subsequently following this conversation his wife was present during this time as well and I do think that he is going to try to drink more I think it is of utmost importance. With that being said I did review his PCR culture as well it was positive for 2 organisms. This was Pseudomonas and Enterobacter. Both were showing up as being very prevalent in the PCR culture and subsequently Cipro will take care of the situation which is what I am recommending at this point based on what we see. This will take the place of the Bactrim and can have him discontinue the Bactrim the only thing is he is getting need to stop taking the hydroxyzine if he has been taking it that is the Vistaril and he tells me that he only takes this when he itches he  has not been taking it recently. 6/7; patient arrives in clinic today with the wound on the right foot actually looking some better the area on the left lateral looking about the same. Problematically, he is having edema fluid leakage on the medial part of the left foot and ankle  causing skin breakdown but no open wound. He has been very resistant to the notion of compression wraps. He has been using Santyl on both wound areas at home changing the dressing himself Partway through our visit today it became clear he had had a fall on Sunday 3 days ago. Since then he has had a lot of problems with his right shoulder 11-23-2021 upon evaluation today patient appears to be doing well with regard to his wound. Has been tolerating dressing changes without complication. Fortunately there does not appear to be any signs of infection he was wrapped last week when Dr. Dellia Nims saw him on the left. He was having some breakdown medially which they were concerned about. He did keep the wrap on until Monday but states it got extremely wet. For that reason I am thinking of doing a nurse visit on Friday so that we can get some of the swelling down right now by Friday we should have a lot of that out that we can switch out and put on a fresh dressing to get him through till next Wednesday. He is in agreement with giving that a try. 11-30-2021 upon evaluation today patient actually appears to be doing better with regard to the overall appearance of his wound. Fortunately there does not appear to be any signs of active infection locally or systemically which is great news. With that being said he has been tolerating the dressing changes without complication other than the fact that from Wednesday to Friday he did well with the compression wrap from Friday till yesterday he tells me the drainage was so bad and the smell was so bad he had to take this off. It does appear that he is very macerated around the lower portion down  around the heel and I do think that this is an issue. Unfortunately we cannot get home health to help with this currently. 12-07-2021 upon evaluation today patient appears to be doing well currently in regard to his wound. He is actually showing signs of excellent improvement which is great news. I am very pleased with where things stand. I do think that the progress is being made. With that being said the patient is tolerating the Santyl quite well and I think is doing an awesome job for him. 12-14-2021 upon evaluation today patient appears to be doing well with regard to his wounds he is making progress is still draining a lot however which I completely understand. With that being said fortunately there does not appear to be any signs of active infection locally or systemically at this time. 12-21-2021 upon evaluation today patient appears to be doing well currently in regard to his wound. He is actually showing signs of significant improvement this is slow but nonetheless week by week we are seeing this improve on the lateral aspect of his foot. The measurements are little bit better but more importantly the wound surface is actually a whole lot cleaner. The patient is pleased to hear this. With that being said he tells me that it is really hard for him to see exactly what all is going on. Nonetheless I am very pleased with all the new red tissue that were seeing granulating and hopefully we get this clean shortly and we can even switch to a different dressing to try to get it to close. 12-28-2021 upon evaluation today patient appears to be doing well currently in regard to his wound is actually showing signs of improvement we are going to perform  some debridement today to clearway some of the necrotic debris. Overall I think that we are at the point where we do not have to use Santyl over the home wound I think using it just on the upper portion may be appropriate if we can get this completely cleaned  away. 7/6; substantial wound on the left lateral foot. Using Santyl and Hydrofera Blue there is been good improvement since the last time I saw this. The patient does not tolerate compression wraps therefore he is using Ace wraps. Fortunately he appears to be doing this fairly well 01-11-22 upon evaluation today patient appears to be doing better in regard to his wound in general though he is having some issues here with blue-green drainage. I am going to go ahead and see about getting him sent in a prescription for gentamicin cream. 01-18-2022 upon evaluation today patient actually appears to be doing quite well in regards to his wound there green drainage we will previously done with has improved. Fortunately I do not see any evidence of active infection locally or systemically which is great news. No fever chills noted Electronic Signature(s) Signed: 01/18/2022 10:43:24 AM By: [name] PA-C Entered By: [name] on 01/18/2022 10:43:23 -------------------------------------------------------------------------------- Physical Exam Details Patient Name: Date of Service: [place], [name] 01/18/2022 9:30 A M Medical Record Number: [id number] Patient Account Number: [id number] Date of Birth/Sex: Treating RN: [birth year] (81 y.o. M) Primary Care Provider: [name] Other Clinician: Referring Provider: Treating Provider/Extender: Azucena Kuba in Treatment: 64 Constitutional Well-nourished and well-hydrated in no acute distress. Respiratory normal breathing without difficulty. Psychiatric this patient is able to make decisions and demonstrates good insight into disease process. Alert and Oriented x 3. pleasant and cooperative. Notes Upon inspection patient's wound bed actually showed signs of good granulation epithelization there was some film noted I was able to clean this off with saline and gauze no sharp debridement necessary today. With that being  said I think we are on the right track here and I am very pleased in that regard. Electronic Signature(s) Signed: 01/18/2022 10:43:48 AM By: [name] PA-C Entered By: [name] on 01/18/2022 10:43:47 -------------------------------------------------------------------------------- Problem List Details Patient Name: Date of Service: [place], [name] 01/18/2022 9:30 A M Medical Record Number: [id number] Patient Account Number: [id number] Date of Birth/Sex: Treating RN: 10/08/1940 (81 y.o. 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

## 2022-01-19 DIAGNOSIS — L97929 Non-pressure chronic ulcer of unspecified part of left lower leg with unspecified severity: Secondary | ICD-10-CM | POA: Diagnosis not present

## 2022-01-20 ENCOUNTER — Other Ambulatory Visit: Payer: Self-pay | Admitting: Internal Medicine

## 2022-01-20 DIAGNOSIS — R609 Edema, unspecified: Secondary | ICD-10-CM

## 2022-01-20 DIAGNOSIS — I10 Essential (primary) hypertension: Secondary | ICD-10-CM

## 2022-01-20 MED ORDER — TORSEMIDE 20 MG PO TABS: ORAL_TABLET | ORAL | 3 refills | Status: DC

## 2022-01-25 ENCOUNTER — Encounter (HOSPITAL_BASED_OUTPATIENT_CLINIC_OR_DEPARTMENT_OTHER): Payer: Medicare HMO | Admitting: Physician Assistant

## 2022-02-01 ENCOUNTER — Encounter (HOSPITAL_BASED_OUTPATIENT_CLINIC_OR_DEPARTMENT_OTHER): Payer: Medicare HMO | Admitting: Physician Assistant

## 2022-02-01 DIAGNOSIS — I87333 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 extremity: Secondary | ICD-10-CM | POA: Diagnosis not present

## 2022-02-01 DIAGNOSIS — L97522 Non-pressure chronic ulcer of other part of left foot with fat layer exposed: Secondary | ICD-10-CM | POA: Diagnosis not present

## 2022-02-01 DIAGNOSIS — E11621 Type 2 diabetes mellitus with foot ulcer: Secondary | ICD-10-CM | POA: Diagnosis not present

## 2022-02-01 DIAGNOSIS — E785 Hyperlipidemia, unspecified: Secondary | ICD-10-CM | POA: Diagnosis not present

## 2022-02-01 DIAGNOSIS — I890 Lymphedema, not elsewhere classified: Secondary | ICD-10-CM | POA: Diagnosis not present

## 2022-02-01 DIAGNOSIS — L958 Other vasculitis limited to the skin: Secondary | ICD-10-CM | POA: Diagnosis not present

## 2022-02-01 DIAGNOSIS — L97512 Non-pressure chronic ulcer of other part of right foot with fat layer exposed: Secondary | ICD-10-CM | POA: Diagnosis not present

## 2022-02-01 DIAGNOSIS — I10 Essential (primary) hypertension: Secondary | ICD-10-CM | POA: Diagnosis not present

## 2022-02-01 DIAGNOSIS — G8929 Other chronic pain: Secondary | ICD-10-CM | POA: Diagnosis not present

## 2022-02-01 NOTE — Progress Notes (Signed)
JAHI, ROZA ([id number]) Visit Report for 02/01/2022 Arrival Information Details Patient Name: Date of Service: [place], [name] 02/01/2022 9:30 A M Medical Record Number: [id number] Patient Account Number: [id number] Date of Birth/Sex: Treating RN: February 16, 1941 (81 y.o. [name] Primary Care Malory Spurr: [name] Other Clinician: Referring Dai Apel: Treating Ameera Tigue/Extender: Azucena Kuba in Treatment: 15 Visit Information History Since Last Visit Added or deleted any medications: No Patient Arrived: Ambulatory Any new allergies or adverse reactions: No Arrival Time: 09:37 Had a fall or experienced change in No Transfer Assistance: None activities of daily living that may affect Patient Identification Verified: Yes risk of falls: Secondary Verification Process Completed: Yes Signs or symptoms of abuse/neglect since last visito No Patient Requires Transmission-Based Precautions: No Hospitalized since last visit: No Patient Has Alerts: No Implantable device outside of the clinic excluding No cellular tissue based products placed in the center since last visit: Has Dressing in Place as Prescribed: Yes Pain Present Now: No Electronic Signature(s) Signed: 02/01/2022 4:52:14 PM By: [name] Entered By: [name] on 02/01/2022 09:37:35 -------------------------------------------------------------------------------- Encounter Discharge Information Details Patient Name: Date of Service: [name], [name] 02/01/2022 9:30 A M Medical Record Number: [id number] Patient Account Number: [id number] Date of Birth/Sex: Treating RN: Jul 24, 1940 (81 y.o. [name] Primary Care Shuna Tabor: [name] Other Clinician: Referring Zakeria Kulzer: Treating Espiridion Supinski/Extender: Azucena Kuba in Treatment: 15 Encounter Discharge Information Items Post Procedure Vitals Discharge Condition: Stable Temperature  (F): 98.5 Ambulatory Status: Ambulatory Pulse (bpm): 68 Discharge Destination: Home Respiratory Rate (breaths/min): 18 Transportation: Private Auto Blood Pressure (mmHg): 127/73 Schedule Follow-up Appointment: Yes Clinical Summary of Care: Provided on 02/01/2022 Form Type Recipient Paper Patient Patient Electronic Signature(s) Signed: 02/01/2022 4:52:14 PM By: [name] Entered By: [name] on 02/01/2022 10:26:39 -------------------------------------------------------------------------------- Lower Extremity Assessment Details Patient Name: Date of Service: [place], [name] 02/01/2022 9:30 A M Medical Record Number: [id number] Patient Account Number: [id number] Date of Birth/Sex: Treating RN: 02/16/1941 (81 y.o. [name] Primary Care Glen Kesinger: [name] Other Clinician: Referring Jilberto Vanderwall: Treating Cameron Katayama/Extender: Azucena Kuba in Treatment: 15 Edema Assessment Assessed: [Left: Yes] Patrice Paradise: No] Edema: [Left: Ye] [Right: s] Calf Left: Right: Point of Measurement: 32 cm From Medial Instep 37.2 cm Ankle Left: Right: Point of Measurement: 10 cm From Medial Instep 27.2 cm Vascular Assessment Pulses: Dorsalis Pedis Palpable: [Left:Yes] Electronic Signature(s) Signed: 02/01/2022 4:52:14 PM By: [name] Entered By: [name] on 02/01/2022 09:48:34 -------------------------------------------------------------------------------- Multi-Disciplinary Care Plan Details Patient Name: Date of Service: [place], [name] 02/01/2022 9:30 A M Medical Record Number: [id number] Patient Account Number: [id number] Date of Birth/Sex: Treating RN: 1940-09-16 (81 y.o. 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 Compression as ordered Notes: 11/16/21: Edema not controlled, patient not compliant with compression. 12/14/21:Edema not well controlled, patient will not allow compression wraps. Wound/Skin Impairment Nursing Diagnoses: Impaired tissue integrity Goals: Patient/caregiver will verbalize understanding of skin care regimen Date Initiated: 10/19/2021 Target Resolution Date: 02/08/2022 Goal Status: Active Ulcer/skin breakdown will have a volume reduction of 30% by week 4 Date  Initiated: 10/19/2021 Date Inactivated: 12/14/2021 Target Resolution Date: 12/14/2021 Unmet Reason: infection, non Goal Status: Unmet compliance Interventions: Assess patient/caregiver ability to obtain necessary supplies Assess patient/caregiver ability to perform ulcer/skin care regimen upon admission and as needed Assess ulceration(s) every visit Provide education on ulcer and skin care Treatment Activities: Topical wound management initiated : 10/19/2021 Notes: 11/16/21: Wound care continues, patient not compliant with edema control. Electronic Signature(s) Signed: 02/01/2022 4:52:14 PM By: [name] Entered By: [name] on 02/01/2022 09:36:56 -------------------------------------------------------------------------------- Pain Assessment Details Patient Name: Date of Service: [place], [name] 02/01/2022 9:30 A M Medical Record Number: [id number] Patient Account Number: [id number] Date of Birth/Sex: Treating RN: January 12, 1941 (81 y.o. [name] Primary Care Bernis Schreur: [name] Other Clinician: Referring Celester Lech: Treating Abriel Hattery/Extender: Azucena Kuba in Treatment: 15 Active Problems Location of Pain Severity and  Description of Pain Patient Has Paino No Site Locations Pain Management and Medication Current Pain Management: Electronic Signature(s) Signed: 02/01/2022 4:52:14 PM By: [name] Entered By: [name] on 02/01/2022 09:45:26 -------------------------------------------------------------------------------- Patient/Caregiver Education Details Patient Name: Date of Service: [place] [name] 8/23/2023andnbsp9:30 A M Medical Record Number: [id number] Patient Account Number: [id number] Date of Birth/Gender: Treating RN: [birth year] (81 y.o. [name] Primary Care Physician: [name] Other Clinician: Referring Physician: Treating Physician/Extender: Azucena Kuba in Treatment: 15 Education Assessment Education Provided To: Patient Education Topics Provided Venous: Methods: Explain/Verbal, Printed Responses: State content correctly Wound/Skin Impairment: Methods: Explain/Verbal, Printed Responses: State content correctly Electronic Signature(s) Signed: 02/01/2022 4:52:14 PM By: [name] Entered By: [name] on 02/01/2022 09:37:15 -------------------------------------------------------------------------------- Wound Assessment Details Patient Name: Date of Service: [name], [name] 02/01/2022 9:30 A M Medical Record Number: [id number] Patient Account Number: [id number] Date of Birth/Sex: Treating RN: 1940/11/24 (81 y.o. [name] Primary Care Chelci [place]: [name] Other Clinician: Referring Collis Thede: Treating Kimaria Struthers/Extender: Azucena Kuba in Treatment: 15 Wound Status Wound Number: 6 Primary Vasculitis Etiology: Wound Location: Left, Lateral Foot Wound Open Wounding Event: Gradually Appeared Status: Date Acquired: 07/25/2021 Comorbid Anemia, Chronic Obstructive Pulmonary Disease (COPD), Weeks Of Treatment: 15 History: Hypertension, Peripheral Venous Disease,  Type II Diabetes, Clustered Wound: No Osteoarthritis, Neuropathy Photos Wound Measurements Length: (cm) 5.4 Width: (cm) 3.5 Depth: (cm) 0.2 Area: (cm) 14.844 Volume: (cm) 2.969 % Reduction in Area: 23.8% % Reduction in Volume: 23.8% Epithelialization: Small (1-33%) Tunneling: No Undermining: No Wound Description Classification: Full Thickness Without Exposed Support Structures Wound Margin: Distinct, outline attached Exudate Amount: Large Exudate Type: Serosanguineous Exudate Color: red, brown Foul Odor After Cleansing: No Slough/Fibrino Yes Wound Bed Granulation Amount: Large (67-100%) Exposed Structure Granulation Quality: Red, Pink, Hyper-granulation Fascia Exposed: No Necrotic Amount: Small (1-33%) Fat Layer (Subcutaneous Tissue) Exposed: Yes Necrotic Quality: Adherent Slough Tendon Exposed: No Muscle Exposed: No Joint Exposed: No Bone Exposed: No Treatment Notes Wound #6 (Foot) Wound Laterality: Left, Lateral Cleanser Soap and Water Discharge Instruction: May shower and wash wound with dial antibacterial soap and water prior to dressing change. Peri-Wound Care Zinc Oxide Ointment 30g tube Discharge Instruction: Apply Zinc Oxide to periwound with each dressing change Topical Gentamicin Discharge Instruction: Apply over entire wound and around edges Primary Dressing Hydrofera Blue Ready Foam, 4x5 in Discharge Instruction: Apply to wound bed as instructed Santyl Ointment Discharge Instruction: Apply to upper portion of wound only Secondary Dressing ABD Pad, 5x9 Discharge Instruction: Apply over primary dressing as directed. Secured With [employer]  Bandage 4 inch (ACE bandage) Discharge Instruction: Secure with ACE bandage as directed. Kerlix Roll Sterile, 4.5x3.1 (in/yd) Discharge Instruction: Secure with Kerlix as directed. 26M Medipore Soft Cloth Surgical T 2x10 (in/yd) ape Discharge Instruction: Secure with tape as directed. Compression Wrap Compression  Stockings Add-Ons Electronic Signature(s) Signed: 02/01/2022 4:52:14 PM By: [name] Entered By: [name] on 02/01/2022 09:52:40 -------------------------------------------------------------------------------- Vitals Details Patient Name: Date of Service: [name], [name] 02/01/2022 9:30 A M Medical Record Number: [id number] Patient Account Number: [id number] Date of Birth/Sex: Treating RN: January 03, 1941 (81 y.o. [name] Primary Care Taylee Gunnells: [name] Other Clinician: Referring Janiene Aarons: Treating Zabdi Mis/Extender: Azucena Kuba in Treatment: 15 Vital Signs Time Taken: 09:44 Temperature (F): 98.5 Height (in): 66 Pulse (bpm): 68 Weight (lbs): 184 Respiratory Rate (breaths/min): 18 Body Mass Index (BMI): 29.7 Blood Pressure (mmHg): 127/73 Reference Range: 80 - 120 mg / dl Electronic Signature(s) Signed: 02/01/2022 4:52:14 PM By: [name] Entered By: [name] on 02/01/2022 09:45:17

## 2022-02-01 NOTE — Progress Notes (Addendum)
[name] Fernandez, [name] Fernandez ([id number]) Visit Report for 02/01/2022 Chief Complaint Document Details Patient Name: Date of Service: [name] Fernandez, [name] Fernandez [place] LD C. 02/01/2022 9:30 A M Medical Record Number: [id number] Patient Account Number: [id number] Date of Birth/Sex: Treating RN: 08-29-1940 (81 y.o. [name] Primary Care Provider: [name] Other Clinician: Referring Provider: Treating Provider/Extender: Azucena Kuba in Treatment: 15 Information Obtained from: Patient Chief Complaint Bilateral foot ulcers Electronic Signature(s) Signed: 02/01/2022 9:59:02 AM By: [name] PA-C Entered By: [name] on 02/01/2022 09:59:02 -------------------------------------------------------------------------------- Debridement Details Patient Name: Date of Service: [place], [name] 02/01/2022 9:30 A M Medical Record Number: [id number] Patient Account Number: [id number] Date of Birth/Sex: Treating RN: 1941/01/16 (81 y.o. [name] Primary Care Provider: [name] Other Clinician: Referring Provider: Treating Provider/Extender: Azucena Kuba in Treatment: 15 Debridement Performed for Assessment: Wound #6 Left,Lateral Foot Performed By: Physician [name], PA Debridement Type: Chemical/Enzymatic/Mechanical Agent Used: Santyl Level of Consciousness (Pre-procedure): Awake and Alert Pre-procedure Verification/Time Out Yes - 10:03 Taken: Start Time: 10:04 Bleeding: None End Time: 10:06 Response to Treatment: Procedure was tolerated well Level of Consciousness (Post- Awake and Alert procedure): Post Debridement Measurements of Total Wound Length: (cm) 5.4 Width: (cm) 3.5 Depth: (cm) 0.2 Volume: (cm) 2.969 Character of Wound/Ulcer Post Debridement: Stable Post Procedure Diagnosis Same as Pre-procedure Electronic Signature(s) Signed: 02/01/2022 4:23:52 PM By: [name] PA-C Signed: 02/01/2022 4:52:14  PM By: [name] Entered By: [name] on 02/01/2022 10:07:52 -------------------------------------------------------------------------------- HPI Details Patient Name: Date of Service: [name], [name] 02/01/2022 9:30 A M Medical Record Number: [id number] Patient Account Number: [id number] Date of Birth/Sex: Treating RN: [birth year] (81 y.o. [name] Primary Care Provider: [name] Other Clinician: Referring Provider: Treating Provider/Extender: Azucena Kuba in Treatment: 15 History of Present Illness HPI Description: 81 year old gentleman known to have swelling of his left lower extremity with some ulceration along the medial ankle has been having these problems for the last 3-4 months and does not know how it came on. No history of injury or no history of any infection in this area. He is known to have diabetes mellitus controlled with diet, hyperlipidemia, hypertension and chronic lumbar back pain with sciatica which is being treated by his PCP. Last hemoglobin A1c was 6.3 in June 2017. Last medical history is significant for esophageal stricture, hiatal hernia, vitamin D deficiency, status post back surgery 3, hernia repair as a child for both groins, hiatal hernia repair, joint replacement and revision of a knee surgery on the right side. He has quit smoking in 1982. He has never had a Doppler study of his lower extremity except remotely when he had knee surgery they looked for a blood clot on the right side. 09/13/2016 -- venous reflux study done on 09/12/2016 showed there is evidence of greater saphenous vein reflux in the left lower extremity and the small saphenous vein and the posterior thigh is not competent. 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 With these results he definitely needs a referral to the vascular surgeons 09/20/2016 -- he has an appointment to see the vascular surgeons on May  1 10/03/2016 -- patient had a 4 layer compression on his left lower extremity and had a lot of discomfort and pain. 10/11/2016-- was seen by Dr. Althea [name] -- review of his data he recommended staged laser ablation of  his great and then small saphenous veins for reduction odd of his venous hypertension. He did examine his right leg with the SonoSite and he has dilatation of the saphenous vein on the right lower extremity too. Since there was no evidence of ulceration he recommended observation of the right lower extremity. He recommended to continue with local wound care at the wound center until his wound was completely healed. 10/18/2016 -- the patient has not been very compliant with his compression, his diet and his diuretics and as a result his lymphedema has increased markedly 10/25/2016 -- he has been compliant this week, has worn his 2 press compression wraps, taken his diuretics and is watching his salt intake. 11/08/2016 -- he had his venous procedure done last week and details of this have been reported and reviewed in his electronic medical record. he had a laser ablation of his great saphenous vein and this was done from mid calf to just below the saphenofemoral junction. He would return next week for ultrasound follow-up. He will then undergo the small saphenous vein ablation in a few weeks. 11/15/2016 -- his postprocedure visit showed good closure of his left great saphenous vein from the mid calf to 1-1/2 cm from the saphenofemoral junction. He had excellent early results from the ablation. The ablation of the left small saphenous vein was planned in a week 12/06/2016 -- he had his small saphenous vein venous ablation a week before and the duplex showed closure of his small saphenous vein to within half centimeter office saphenous popliteal junction with no DVT and he was pleased with the results. He recommended continue with elevation and compression and to see them back on an as-needed  basis 12/19/16; the patient's wound is actually closed. Sometime over the weekend he developed a small abrasion on the lower calf just above the original wound on the left medial malleolus although this is closed as well Readmission: 01/18/18 on evaluation today patient presents after having last been seen in our clinic July 2018. Subsequently he is a reoccurrence of the ulcers on the left ankle both medial and now a new area lateral that had been present back room for about a month he tells me. He states that this has done very well for about a year he really does not know of any injury or anything that happened that would have caused this reopening at this point. He has continued to wear compression stockings which is appropriate. He does not have lymphedema pumps. He has continued to also keep the area clean and dry as best he could. With that being said now that is been reopened is been harder for him to take care of this as far as compression stockings are concerned keeping them clean along with continuing to manage his fluid and swelling. His ABI appears to be great on the left registering at 1.14 today. He has previously undergone a venous ablation. Other than compression/lymphedema pumps he's really done everything that he can to help manage and prevent these ulcers from forming. 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 01/30/18 on evaluation today patient appears to actually be doing very well in regard to his medial ankle ulcer. It's the lateral ulcer that actually is not doing quite as well today. Fortunately he did have some alginate left ovary start using this on the wounds and the medial ankle actually seems to be doing much better. The lateral ankle does actually need something to con a help with slough and buildup. The collagen  really did not seem to do much for him in that regard. 02/13/18 on evaluation today patient appears to be doing about the same in regard to his ulcers. Unfortunately I do not  feel like were making good progress at this time. When I have debrided the wound the slough seems to come back fairly quickly in the lateral ankle ulcer. There does not appear to be any evidence of infection at this time. No fevers, chills, nausea, or vomiting noted at this time. 02/27/18 on evaluation today patient actually appears to be showing some signs of improvement in regard to both wound areas. I'm insurance on the medial aspect of his left lower extremity at the ankle whether or not the alginate may be sticking and causing some tearing off of new skin attempting to grow. With that being said the patient states it does not seem to be doing such just pulls up some of the dried dead skin around. Nonetheless this is something I wanted to watch out for going forward and actually I recommended that he take the dressing off in the shower when you could get it completely wet before removal to see how this does. Subsequently in regard to the lateral ankle ulcer he still has slough noted at this point although I do believe that he is tolerating the Medihoney much better I wish you could have gotten the Santyl but unfortunately the Santyl was too expensive gonna cost him $250. 03/06/18 on evaluation today patient appears to be doing about the same in regard to both ulcers of his left medial and lateral malleolus sites. He's been tolerating the dressing changes without complication. Unfortunately things do not seem to be improving in regard to either side at this time. Overall I think we may need to switch things up a little bit as far as the way we are performing the dressing changes currently. 03/27/18 on evaluation today patient's wound bed actually appears to be doing much better at both locations in regard to his ankle ulcers. He has been tolerating the dressing changes at this time without complication. Overall I'm very pleased with how things appear. The patient likewise states that he's much better  in regard to discomfort at this time. 04/10/18 on evaluation today patient actually appears to be doing well in regard to his left lateral ankle ulcer. He has been tolerating the dressing changes without complication. Fortunately there does not appear to be any evidence of infection. Overall I do feel like he is tolerating the Medihoney without complication. 04/24/18 upon evaluation today patient's wound actually appears to be healed on the lateral aspect of his ankle the medial aspect still continues to remain close without any evidence of complication at this time. Overall I have been extremely pleased with how things have progressed up to this point. The patient likewise is very happy he's not having any significant pain this time. He does wears compression stocking on a regular basis. Readmission: 10-19-2021 upon evaluation today patient presents for evaluation here in the clinic concerning issues with a left lateral ulcer as well as a right lateral ulcer both along the aspect of his foot getting close to the ankle. This left area is the same place that I treated back in 2024. 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 This appears to be very inflamed and is also very painful. In the past he has been completely unable to tolerate the use of any compression  wraps unfortunately. There does not appear to be any evidence of active infection locally nor systemically at this time which is good news. I do believe however we need to try to see what we can do to calm down the inflammation I think triamcinolone topically as well as oral prednisone would likely be indicated in this case. Patient's medical history really has not changed significantly since I last saw him in actually 2019 though I think is stated 2020 above. 10-26-21 upon evaluation today patient appears to still be having a lot of irritation and inflammation in regard to the ankles laterally. The left  is greater than the right. With that being said I do believe that he would benefit from a biopsy to confirm whether or not this may indeed be vasculitis. It actually seems like that physically and on examination but again I want to be sure that we are on the right track as far as treatment is concerned. The good news is he did not have a DVT I called him about that on Friday this was all in regard to the right leg. Overall I am very pleased in that regard. 11-02-2021 upon evaluation today patient appears to be doing poorly still in regard to his left lower extremity. Apparently he had a fever on Sunday which was around 101 and he was severely sick feeling and disoriented according to his wife. She came with him today because she did not think that he would actually tell me what was going on. With that being said unfortunately he did not go to the hospital he actually has been doing a little better over the past few days he been taking amoxicillin he has not taken any other medications orally at this point that are different. He has not been on any antibiotics either more recently other than the amoxicillin. Nonetheless he has not had a repeat in the past 3 days of that issue. 11-09-2021 upon evaluation today patient presents after having had quite an ordeal over the past several days. Subsequently yesterday I got a call from [facility] here in [place] concerning the patient and the fact that he was having bleeding that been going on for the past 24 hours and was not stopping. Subsequently my advice was to have the patient go to the ER for further evaluation and treatment. It seems to me that he had a regular way part of the wound down to the point that a blood vessel and open. Apparently he was filling up trash bags wrapped around his foot with blood. In fact his lab review which I did do him as well today showed that he went from a white hemoglobin on 09-14-2021 of 13.6 down to a hemoglobin of 11.2 on  11-08-2021 this was yesterday. Subsequently this is due to the amount of blood loss that he has had acutely. Again I do not believe he is at transfusion stage but at the same time he is extremely weak and states that a couple times when he is going to stand up he is actually fallen back into his chair. Nonetheless I do believe that he probably needs to supplement with iron and I did recommend that there are some over-the-counter products he could get in this regard. Subsequently also suggested that the patient needs to be drinking plenty of water he apparently drinks Coke and he drinks coffee but that is about it. Subsequently following this conversation his wife was present during this time as well and I do think  that he is going to try to drink more I think it is of utmost importance. With that being said I did review his PCR culture as well it was positive for 2 organisms. This was Pseudomonas and Enterobacter. Both were showing up as being very prevalent in the PCR culture and subsequently Cipro will take care of the situation which is what I am recommending at this point based on what we see. This will take the place of the Bactrim and can have him discontinue the Bactrim the only thing is he is getting need to stop taking the hydroxyzine if he has been taking it that is the Vistaril and he tells me that he only takes this when he itches he has not been taking it recently. 6/7; patient arrives in clinic today with the wound on the right foot actually looking some better the area on the left lateral looking about the same. Problematically, he is having edema fluid leakage on the medial part of the left foot and ankle causing skin breakdown but no open wound. He has been very resistant to the notion of compression wraps. He has been using Santyl on both wound areas at home changing the dressing himself Partway through our visit today it became clear he had had a fall on Sunday 3 days ago. Since then  he has had a lot of problems with his right shoulder 11-23-2021 upon evaluation today patient appears to be doing well with regard to his wound. Has been tolerating dressing changes without complication. Fortunately there does not appear to be any signs of infection he was wrapped last week when Dr. Dellia Nims saw him on the left. He was having some breakdown medially which they were concerned about. He did keep the wrap on until Monday but states it got extremely wet. For that reason I am thinking of doing a nurse visit on Friday so that we can get some of the swelling down right now by Friday we should have a lot of that out that we can switch out and put on a fresh dressing to get him through till next Wednesday. He is in agreement with giving that a try. 11-30-2021 upon evaluation today patient actually appears to be doing better with regard to the overall appearance of his wound. Fortunately there does not appear to be any signs of active infection locally or systemically which is great news. With that being said he has been tolerating the dressing changes without complication other than the fact that from Wednesday to Friday he did well with the compression wrap from Friday till yesterday he tells me the drainage was so bad and the smell was so bad he had to take this off. It does appear that he is very macerated around the lower portion down around the heel and I do think that this is an issue. Unfortunately we cannot get home health to help with this currently. 12-07-2021 upon evaluation today patient appears to be doing well currently in regard to his wound. He is actually showing signs of excellent improvement which is great news. I am very pleased with where things stand. I do think that the progress is being made. With that being said the patient is tolerating the Santyl quite well and I think is doing an awesome job for him. 12-14-2021 upon evaluation today patient appears to be doing well with  regard to his wounds he is making progress is still draining a lot however which I completely understand. With that  being said fortunately there does not appear to be any signs of active infection locally or systemically at this time. 12-21-2021 upon evaluation today patient appears to be doing well currently in regard to his wound. He is actually showing signs of significant improvement this is slow but nonetheless week by week we are seeing this improve on the lateral aspect of his foot. The measurements are little bit better but more importantly the wound surface is actually a whole lot cleaner. The patient is pleased to hear this. With that being said he tells me that it is really hard for him to see exactly what all is going on. Nonetheless I am very pleased with all the new red tissue that were seeing granulating and hopefully we get this clean shortly and we can even switch to a different dressing to try to get it to close. 12-28-2021 upon evaluation today patient appears to be doing well currently in regard to his wound is actually showing signs of improvement we are going to perform some debridement today to clearway some of the necrotic debris. Overall I think that we are at the point where we do not have to use Santyl over the home wound I think using it just on the upper portion may be appropriate if we can get this completely cleaned away. 7/6; substantial wound on the left lateral foot. Using Santyl and Hydrofera Blue there is been good improvement since the last time I saw this. The patient does not tolerate compression wraps therefore he is using Ace wraps. Fortunately he appears to be doing this fairly well 01-11-22 upon evaluation today patient appears to be doing better in regard to his wound in general though he is having some issues here with blue-green drainage. I am going to go ahead and see about getting him sent in a prescription for gentamicin cream. 01-18-2022 upon evaluation today  patient actually appears to be doing quite well in regards to his wound there green drainage we will previously done with has improved. Fortunately I do not see any evidence of active infection locally or systemically which is great news. No fever chills noted 02-01-2022 upon evaluation today patient appears to be doing well with regard to his wound. Has been tolerating the dressing changes without complication. Fortunately I see no signs of active infection locally or systemically at this time which is great news. Electronic Signature(s) Signed: 02/01/2022 10:06:25 AM By: [name] PA-C Entered By: [name] on 02/01/2022 10:06:25 -------------------------------------------------------------------------------- Physical Exam Details Patient Name: Date of Service: [place], [name] 02/01/2022 9:30 A M Medical Record Number: [id number] Patient Account Number: [id number] Date of Birth/Sex: Treating RN: 1940/08/09 (81 y.o. [name] Primary Care Provider: [name] Other Clinician: Referring Provider: Treating Provider/Extender: Azucena Kuba in Treatment: 66 Constitutional Well-nourished and well-hydrated in no acute distress. Respiratory normal breathing without difficulty. Psychiatric this patient is able to make decisions and demonstrates good insight into disease process. Alert and Oriented x 3. pleasant and cooperative. Notes Patient's wound bed showed evidence of good epithelization and granulation I am actually extremely pleased with where things stand and I think he is headed in the right direction here for certain. Electronic Signature(s) Signed: 02/01/2022 10:06:36 AM By: [name] PA-C Entered By: [name] on 02/01/2022 10:06:35 -------------------------------------------------------------------------------- Physician Orders Details Patient Name: Date of Service: [place], [name] 02/01/2022 9:30 A M Medical  Record Number: [id number] Patient Account  Number: [id number] Date of Birth/Sex: Treating RN: 12/08/1940 (81 y.o. 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 - 02/15/22 @ 9:30AM with Bernette Redbird, Room 7) Return A Other: - Prism:Supplies Cellular or Tissue Based Products Cellular or Tissue Based Product Type: - Will run IVR for Apligraf= $295 [employer] Bathing/ Shower/ Hygiene May shower and wash wound with soap and water. - wash with antibacterial soap when changing dressing Edema Control - Lymphedema / SCD / Other Elevate legs to the level of the heart or above for 30 minutes daily and/or when sitting, a frequency of: - throughout the day Patient to wear own compression stockings every day. - Use stocking daily to right leg Moisturize legs daily. - Eucerin (in jar) Additional Orders / Instructions Follow Nutritious Diet Wound Treatment Wound #6 - Foot Wound Laterality: Left, Lateral Cleanser: Soap and Water Every Other Day/30 Days Discharge Instructions: May shower and wash wound with dial antibacterial soap and water prior to dressing change. Peri-Wound Care: Zinc Oxide Ointment 30g tube Every Other Day/30 Days Discharge Instructions: Apply Zinc Oxide to periwound with each dressing change Topical: Gentamicin Every Other  Day/30 Days Discharge Instructions: Apply over entire wound and around edges Prim Dressing: Hydrofera Blue Ready Foam, 4x5 in Every Other Day/30 Days ary Discharge Instructions: Apply to wound bed as instructed Prim Dressing: Santyl Ointment Every Other Day/30 Days ary Discharge Instructions: Apply to upper portion of wound only Secondary Dressing: ABD Pad, 5x9 (Generic) Every Other Day/30 Days Discharge Instructions: Apply over primary dressing as directed. Secured With: Elastic Bandage 4 inch (ACE bandage) (Generic) Every Other Day/30 Days Discharge Instructions: Secure with ACE bandage as directed. Secured With: [employer], 4.5x3.1 (in/yd) (Generic) Every Other Day/30 Days Discharge Instructions: Secure with Kerlix as directed. Secured With: 36M Medipore Public affairs consultant Surgical T 2x10 (in/yd) (Generic) Every Other Day/30 Days ape Discharge Instructions: Secure with tape as directed. Electronic Signature(s) Signed: 02/01/2022 4:23:52 PM By: [name] PA-C Signed: 02/01/2022 4:52:14 PM By: [name] Entered By: [name] on 02/01/2022 10:06:32 -------------------------------------------------------------------------------- Problem List Details Patient Name: Date of Service: [place], [name] 02/01/2022 9:30 A M Medical Record Number: [id number] Patient Account Number: [id number] Date of Birth/Sex: Treating RN: 29-Jun-1940 (81 y.o. 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 10/19/2021 No Yes L97.522 Non-pressure chronic  ulcer of other part of left foot with fat layer exposed 10/19/2021 No Yes L97.512 Non-pressure chronic ulcer of other part of right foot with fat layer exposed 10/19/2021 No Yes E11.622 Type 2 diabetes mellitus with other skin ulcer 10/19/2021 No Yes Inactive Problems Resolved Problems  Electronic Signature(s) Signed: 02/01/2022 9:58:51 AM By: [name] PA-C Entered By: [name] on 02/01/2022 09:58:51 -------------------------------------------------------------------------------- Progress Note Details Patient Name: Date of Service: [place], [name] 02/01/2022 9:30 A M Medical Record Number: [id number] Patient Account Number: [id number] Date of Birth/Sex: Treating RN: 10-12-1940 (81 y.o. [name] Primary Care Provider: [name] Other Clinician: Referring Provider: Treating Provider/Extender: Azucena Kuba in Treatment: 15 Subjective Chief Complaint Information obtained from Patient Bilateral foot ulcers History of Present Illness (HPI) 81 year old gentleman known to have swelling of his left lower extremity with some ulceration along the medial ankle has been having these problems for the last 3-4 months and does not know how it came on. No history of injury or no history of any infection in this area. He is known to have diabetes mellitus controlled with diet, hyperlipidemia, hypertension and chronic lumbar back pain with sciatica which is being treated by his PCP. Last hemoglobin A1c was 6.3 in June 2017. Last medical history is significant for esophageal stricture, hiatal hernia, vitamin D deficiency, status post back surgery o3, hernia repair as a child for both groins, hiatal hernia repair, joint replacement and revision of a knee surgery on the right side. He has quit smoking in 1982. He has never had a Doppler study of his lower extremity except remotely when he had knee surgery they looked for a blood clot on the right  side. 09/13/2016 -- venous reflux study done on 09/12/2016 showed there is evidence of greater saphenous vein reflux in the left lower extremity and the small saphenous vein and the posterior thigh is not competent. 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 With these results he definitely needs a referral to the vascular surgeons 09/20/2016 -- he has an appointment to see the vascular surgeons on May 1 10/03/2016 -- patient had a 4 layer compression on his left lower extremity and had a lot of discomfort and pain. 10/11/2016-- was seen by Dr. Althea [name] -- review of his data he recommended staged laser ablation of his great and then small saphenous veins for reduction odd of his venous hypertension. He did examine his right leg with the SonoSite and he has dilatation of the saphenous vein on the right lower extremity too. Since there was no evidence of ulceration he recommended observation of the right lower extremity. He recommended to continue with local wound care at the wound center until his wound was completely healed. 10/18/2016 -- the patient has not been very compliant with his compression, his diet and his diuretics and as a result his lymphedema has increased markedly 10/25/2016 -- he has been compliant this week, has worn his 2 press compression wraps, taken his diuretics and is watching his salt intake. 11/08/2016 -- he had his venous procedure done last week and details of this have been reported and reviewed in his electronic medical record. he had a laser ablation of his great saphenous vein and this was done from mid calf to just below the saphenofemoral junction. He would return next week for ultrasound follow-up. He will then undergo the small saphenous vein ablation in a few weeks. 11/15/2016 -- his postprocedure visit showed good closure of his left great saphenous vein from the mid calf to 1-1/2 cm from the saphenofemoral junction. He had excellent early  results from the ablation. The ablation of the left small saphenous vein was planned in a week 12/06/2016 -- he  had his small saphenous vein venous ablation a week before and the duplex showed closure of his small saphenous vein to within half centimeter office saphenous popliteal junction with no DVT and he was pleased with the results. He recommended continue with elevation and compression and to see them back on an as-needed basis 12/19/16; the patient's wound is actually closed. Sometime over the weekend he developed a small abrasion on the lower calf just above the original wound on the left medial malleolus although this is closed as well Readmission: 01/18/18 on evaluation today patient presents after having last been seen in our clinic July 2018. Subsequently he is a reoccurrence of the ulcers on the left ankle both medial and now a new area lateral that had been present back room for about a month he tells me. He states that this has done very well for about a year he really does not know of any injury or anything that happened that would have caused this reopening at this point. He has continued to wear compression stockings which is appropriate. He does not have lymphedema pumps. He has continued to also keep the area clean and dry as best he could. With that being said now that is been reopened is been harder for him to take care of this as far as compression stockings are concerned keeping them clean along with continuing to manage his fluid and swelling. His ABI appears to be great on the left registering at 1.14 today. He has previously undergone a venous ablation. Other than compression/lymphedema pumps he's really done everything that he can to help manage and prevent these ulcers from forming. 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 01/30/18 on evaluation today patient appears to actually be doing very well in regard to his medial ankle ulcer. It's the lateral ulcer that actually is not doing  quite as well today. Fortunately he did have some alginate left ovary start using this on the wounds and the medial ankle actually seems to be doing much better. The lateral ankle does actually need something to con a help with slough and buildup. The collagen really did not seem to do much for him in that regard. 02/13/18 on evaluation today patient appears to be doing about the same in regard to his ulcers. Unfortunately I do not feel like were making good progress at this time. When I have debrided the wound the slough seems to come back fairly quickly in the lateral ankle ulcer. There does not appear to be any evidence of infection at this time. No fevers, chills, nausea, or vomiting noted at this time. 02/27/18 on evaluation today patient actually appears to be showing some signs of improvement in regard to both wound areas. I'm insurance on the medial aspect of his left lower extremity at the ankle whether or not the alginate may be sticking and causing some tearing off of new skin attempting to grow. With that being said the patient states it does not seem to be doing such just pulls up some of the dried dead skin around. Nonetheless this is something I wanted to watch out for going forward and actually I recommended that he take the dressing off in the shower when you could get it completely wet before removal to see how this does. Subsequently in regard to the lateral ankle ulcer he still has slough noted at this point although I do believe that he is tolerating the Medihoney much better I wish you could have gotten the [facility]  but unfortunately the Santyl was too expensive gonna cost him $250. 03/06/18 on evaluation today patient appears to be doing about the same in regard to both ulcers of his left medial and lateral malleolus sites. He's been tolerating the dressing changes without complication. Unfortunately things do not seem to be improving in regard to either side at this time. Overall I  think we may need to switch things up a little bit as far as the way we are performing the dressing changes currently. 03/27/18 on evaluation today patient's wound bed actually appears to be doing much better at both locations in regard to his ankle ulcers. He has been tolerating the dressing changes at this time without complication. Overall I'm very pleased with how things appear. The patient likewise states that he's much better in regard to discomfort at this time. 04/10/18 on evaluation today patient actually appears to be doing well in regard to his left lateral ankle ulcer. He has been tolerating the dressing changes without complication. Fortunately there does not appear to be any evidence of infection. Overall I do feel like he is tolerating the Medihoney without complication. 04/24/18 upon evaluation today patient's wound actually appears to be healed on the lateral aspect of his ankle the medial aspect still continues to remain close without any evidence of complication at this time. Overall I have been extremely pleased with how things have progressed up to this point. The patient likewise is very happy he's not having any significant pain this time. He does wears compression stocking on a regular basis. Readmission: 10-19-2021 upon evaluation today patient presents for evaluation here in the clinic concerning issues with a left lateral ulcer as well as a right lateral ulcer both along the aspect of his foot getting close to the ankle. This left area is the same place that I treated back in 2024. 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 This appears to be very inflamed and is also very painful. In the past he has been completely unable to tolerate the use of any compression wraps unfortunately. There does not appear to be any evidence of active infection locally nor systemically at this time which is good news. I do believe however we need to  try to see what we can do to calm down the inflammation I think triamcinolone topically as well as oral prednisone would likely be indicated in this case. Patient's medical history really has not changed significantly since I last saw him in actually 2019 though I think is stated 2020 above. 10-26-21 upon evaluation today patient appears to still be having a lot of irritation and inflammation in regard to the ankles laterally. The left is greater than the right. With that being said I do believe that he would benefit from a biopsy to confirm whether or not this may indeed be vasculitis. It actually seems like that physically and on examination but again I want to be sure that we are on the right track as far as treatment is concerned. The good news is he did not have a DVT I called him about that on Friday this was all in regard to the right leg. Overall I am very pleased in that regard. 11-02-2021 upon evaluation today patient appears to be doing poorly still in regard to his left lower extremity. Apparently he had a fever on Sunday which was around 101 and he was severely sick feeling and disoriented according to his  wife. She came with him today because she did not think that he would actually tell me what was going on. With that being said unfortunately he did not go to the hospital he actually has been doing a little better over the past few days he been taking amoxicillin he has not taken any other medications orally at this point that are different. He has not been on any antibiotics either more recently other than the amoxicillin. Nonetheless he has not had a repeat in the past 3 days of that issue. 11-09-2021 upon evaluation today patient presents after having had quite an ordeal over the past several days. Subsequently yesterday I got a call from [place] here in [place] concerning the patient and the fact that he was having bleeding that been going on for the past 24 hours and was not  stopping. Subsequently my advice was to have the patient go to the ER for further evaluation and treatment. It seems to me that he had a regular way part of the wound down to the point that a blood vessel and open. Apparently he was filling up trash bags wrapped around his foot with blood. In fact his lab review which I did do him as well today showed that he went from a white hemoglobin on 09-14-2021 of 13.6 down to a hemoglobin of 11.2 on 11-08-2021 this was yesterday. Subsequently this is due to the amount of blood loss that he has had acutely. Again I do not believe he is at transfusion stage but at the same time he is extremely weak and states that a couple times when he is going to stand up he is actually fallen back into his chair. Nonetheless I do believe that he probably needs to supplement with iron and I did recommend that there are some over-the-counter products he could get in this regard. Subsequently also suggested that the patient needs to be drinking plenty of water he apparently drinks Coke and he drinks coffee but that is about it. Subsequently following this conversation his wife was present during this time as well and I do think that he is going to try to drink more I think it is of utmost importance. With that being said I did review his PCR culture as well it was positive for 2 organisms. This was Pseudomonas and Enterobacter. Both were showing up as being very prevalent in the PCR culture and subsequently Cipro will take care of the situation which is what I am recommending at this point based on what we see. This will take the place of the Bactrim and can have him discontinue the Bactrim the only thing is he is getting need to stop taking the hydroxyzine if he has been taking it that is the Vistaril and he tells me that he only takes this when he itches he has not been taking it recently. 6/7; patient arrives in clinic today with the wound on the right foot actually looking some  better the area on the left lateral looking about the same. Problematically, he is having edema fluid leakage on the medial part of the left foot and ankle causing skin breakdown but no open wound. He has been very resistant to the notion of compression wraps. He has been using Santyl on both wound areas at home changing the dressing himself Partway through our visit today it became clear he had had a fall on Sunday 3 days ago. Since then he has had a lot of problems with his  right shoulder 11-23-2021 upon evaluation today patient appears to be doing well with regard to his wound. Has been tolerating dressing changes without complication. Fortunately there does not appear to be any signs of infection he was wrapped last week when Dr. Dellia Nims saw him on the left. He was having some breakdown medially which they were concerned about. He did keep the wrap on until Monday but states it got extremely wet. For that reason I am thinking of doing a nurse visit on Friday so that we can get some of the swelling down right now by Friday we should have a lot of that out that we can switch out and put on a fresh dressing to get him through till next Wednesday. He is in agreement with giving that a try. 11-30-2021 upon evaluation today patient actually appears to be doing better with regard to the overall appearance of his wound. Fortunately there does not appear to be any signs of active infection locally or systemically which is great news. With that being said he has been tolerating the dressing changes without complication other than the fact that from Wednesday to Friday he did well with the compression wrap from Friday till yesterday he tells me the drainage was so bad and the smell was so bad he had to take this off. It does appear that he is very macerated around the lower portion down around the heel and I do think that this is an issue. Unfortunately we cannot get home health to help with this  currently. 12-07-2021 upon evaluation today patient appears to be doing well currently in regard to his wound. He is actually showing signs of excellent improvement which is great news. I am very pleased with where things stand. I do think that the progress is being made. With that being said the patient is tolerating the Santyl quite well and I think is doing an awesome job for him. 12-14-2021 upon evaluation today patient appears to be doing well with regard to his wounds he is making progress is still draining a lot however which I completely understand. With that being said fortunately there does not appear to be any signs of active infection locally or systemically at this time. 12-21-2021 upon evaluation today patient appears to be doing well currently in regard to his wound. He is actually showing signs of significant improvement this is slow but nonetheless week by week we are seeing this improve on the lateral aspect of his foot. The measurements are little bit better but more importantly the wound surface is actually a whole lot cleaner. The patient is pleased to hear this. With that being said he tells me that it is really hard for him to see exactly what all is going on. Nonetheless I am very pleased with all the new red tissue that were seeing granulating and hopefully we get this clean shortly and we can even switch to a different dressing to try to get it to close. 12-28-2021 upon evaluation today patient appears to be doing well currently in regard to his wound is actually showing signs of improvement we are going to perform some debridement today to clearway some of the necrotic debris. Overall I think that we are at the point where we do not have to use Santyl over the home wound I think using it just on the upper portion may be appropriate if we can get this completely cleaned away. 7/6; substantial wound on the left lateral foot. Using Wallace and  Hydrofera Blue there is been good  improvement since the last time I saw this. The patient does not tolerate compression wraps therefore he is using Ace wraps. Fortunately he appears to be doing this fairly well 01-11-22 upon evaluation today patient appears to be doing better in regard to his wound in general though he is having some issues here with blue-green drainage. I am going to go ahead and see about getting him sent in a prescription for gentamicin cream. 01-18-2022 upon evaluation today patient actually appears to be doing quite well in regards to his wound there green drainage we will previously done with has improved. Fortunately I do not see any evidence of active infection locally or systemically which is great news. No fever chills noted 02-01-2022 upon evaluation today patient appears to be doing well with regard to his wound. Has been tolerating the dressing changes without complication. Fortunately I see no signs of active infection locally or systemically at this time which is great news. Objective Constitutional Well-nourished and well-hydrated in no acute distress. Vitals Time Taken: 9:44 AM, Height: 66 in, Weight: 184 lbs, BMI: 29.7, Temperature: 98.5 F, Pulse: 68 bpm, Respiratory Rate: 18 breaths/min, Blood Pressure: 127/73 mmHg. Respiratory normal breathing without difficulty. Psychiatric this patient is able to make decisions and demonstrates good insight into disease process. Alert and Oriented x 3. pleasant and cooperative. General Notes: Patient's wound bed showed evidence of good epithelization and granulation I am actually extremely pleased with where things stand and I think he is headed in the right direction here for certain. Integumentary (Hair, Skin) Wound #6 status is Open. Original cause of wound was Gradually Appeared. The date acquired was: 07/25/2021. The wound has been in treatment 15 weeks. The wound is located on the Left,Lateral Foot. The wound measures 5.4cm length x 3.5cm width x 0.2cm  depth; 14.844cm^2 area and 2.969cm^3 volume. There is Fat Layer (Subcutaneous Tissue) exposed. There is no tunneling or undermining noted. There is a large amount of serosanguineous drainage noted. The wound margin is distinct with the outline attached to the wound base. There is large (67-100%) red, pink, hyper - granulation within the wound bed. There is a small (1-33%) amount of necrotic tissue within the wound bed including Adherent Slough. 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 - 02/15/22 @ 9:30AM with Bernette Redbird, Room 7) Other: - Prism:Supplies Cellular or Tissue Based Products: Cellular or Tissue Based Product Type: - Will run IVR for Apligraf= $295 [employer] Bathing/ Shower/ Hygiene: May shower and wash wound with soap and water. - wash with antibacterial soap when changing dressing Edema Control - Lymphedema / SCD / Other: Elevate legs to the level of the heart or above for 30 minutes daily and/or when sitting, a frequency of: - throughout the day Patient to wear own compression stockings every day. - Use stocking daily to right leg Moisturize legs daily. - Eucerin (in jar) Additional Orders / Instructions: Follow Nutritious Diet WOUND #6: - Foot Wound Laterality: Left, Lateral Cleanser: Soap and Water Every Other Day/30 Days Discharge Instructions: May shower and wash wound with dial antibacterial soap and water prior to dressing change.  Peri-Wound Care: Zinc Oxide Ointment 30g tube Every Other Day/30 Days Discharge Instructions: Apply Zinc Oxide to periwound  with each dressing change Topical: Gentamicin Every Other Day/30 Days Discharge Instructions: Apply over entire wound and around edges Prim Dressing: Hydrofera Blue Ready Foam, 4x5 in Every Other Day/30 Days ary Discharge Instructions: Apply to wound bed as instructed Prim Dressing: Santyl Ointment Every Other Day/30 Days ary Discharge Instructions: Apply to upper portion of wound only Secondary Dressing: ABD Pad, 5x9 (Generic) Every Other Day/30 Days Discharge Instructions: Apply over primary dressing as directed. Secured With: Elastic Bandage 4 inch (ACE bandage) (Generic) Every Other Day/30 Days Discharge Instructions: Secure with ACE bandage as directed. Secured With: [employer], 4.5x3.1 (in/yd) (Generic) Every Other Day/30 Days Discharge Instructions: Secure with Kerlix as directed. Secured With: 75M Medipore Public affairs consultant Surgical T 2x10 (in/yd) (Generic) Every Other Day/30 Days ape Discharge Instructions: Secure with tape as directed. 1. I am going to recommend that we go ahead and continue with the wound care measures as before and the patient is in agreement with plan. This includes the use of the gentamicin throughout the whole he is using some Santyl as well mixed with gentamicin in the upper portion overall this is really making good progress. 2. We will continue with ABD pad to cover followed by roll gauze to secure in place. 3. Skin to continue using the Ace wrap as well to help with edema control I think that is a good way to go. We will see patient back for reevaluation in 1 week here in the clinic. If anything worsens or changes patient will contact our office for additional recommendations. Electronic Signature(s) Signed: 02/01/2022 10:07:07 AM By: [name] PA-C Entered By: [name] on 02/01/2022 10:07:07 -------------------------------------------------------------------------------- SuperBill Details Patient Name: Date of Service: [place], [name] NA LD  C. 02/01/2022 Medical Record Number: [id number] Patient Account Number: [id number] Date of Birth/Sex: Treating RN: 10-20-1940 (81 y.o. 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 layer exposed E11.622 Type 2 diabetes mellitus with other skin ulcer Facility Procedures CPT4 Code: [id number] Description: [phone] - DEBRIDE W/O ANES NON SELECT ICD-10 Diagnosis Description L97.522 Non-pressure chronic ulcer of other part of left foot with fat layer exposed Modifier: Quantity: 1 Physician Procedures : CPT4 Code Description Modifier [id number] - WC PHYS LEVEL 4 - EST PT ICD-10 Diagnosis Description L97.522 Non-pressure chronic ulcer of other part of left foot with fat layer exposed I87.333 Chronic venous hypertension (idiopathic) with ulcer and  inflammation of bilateral lower extremity 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

## 2022-02-08 ENCOUNTER — Encounter (HOSPITAL_BASED_OUTPATIENT_CLINIC_OR_DEPARTMENT_OTHER): Payer: Medicare HMO | Admitting: Physician Assistant

## 2022-02-15 ENCOUNTER — Encounter (HOSPITAL_BASED_OUTPATIENT_CLINIC_OR_DEPARTMENT_OTHER): Payer: Medicare HMO | Attending: Physician Assistant | Admitting: Physician Assistant

## 2022-02-15 DIAGNOSIS — G8929 Other chronic pain: Secondary | ICD-10-CM | POA: Insufficient documentation

## 2022-02-15 DIAGNOSIS — I890 Lymphedema, not elsewhere classified: Secondary | ICD-10-CM | POA: Diagnosis not present

## 2022-02-15 DIAGNOSIS — L97522 Non-pressure chronic ulcer of other part of left foot with fat layer exposed: Secondary | ICD-10-CM | POA: Insufficient documentation

## 2022-02-15 DIAGNOSIS — I87333 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 extremity: Secondary | ICD-10-CM | POA: Diagnosis not present

## 2022-02-15 DIAGNOSIS — L97512 Non-pressure chronic ulcer of other part of right foot with fat layer exposed: Secondary | ICD-10-CM | POA: Insufficient documentation

## 2022-02-15 DIAGNOSIS — Z87891 Personal history of nicotine dependence: Secondary | ICD-10-CM | POA: Insufficient documentation

## 2022-02-15 DIAGNOSIS — L958 Other vasculitis limited to the skin: Secondary | ICD-10-CM | POA: Diagnosis not present

## 2022-02-15 DIAGNOSIS — E11622 Type 2 diabetes mellitus with other skin ulcer: Secondary | ICD-10-CM | POA: Diagnosis not present

## 2022-02-15 DIAGNOSIS — M5440 Lumbago with sciatica, unspecified side: Secondary | ICD-10-CM | POA: Diagnosis not present

## 2022-02-15 DIAGNOSIS — M318 Other specified necrotizing vasculopathies: Secondary | ICD-10-CM | POA: Diagnosis not present

## 2022-02-15 NOTE — Progress Notes (Addendum)
RANDIE, TALLARICO ([id number]) Visit Report for 02/15/2022 Chief Complaint Document Details Patient Name: Date of Service: [place], [name] 02/15/2022 9:30 A M Medical Record Number: [id number] Patient Account Number: [id number] Date of Birth/Sex: Treating RN: Aug 01, 1940 (81 y.o. M) Primary Care Provider: [name] Other Clinician: Referring Provider: Treating Provider/Extender: Azucena Kuba in Treatment: 17 Information Obtained from: Patient Chief Complaint Bilateral foot ulcers Electronic Signature(s) Signed: 02/15/2022 9:24:48 AM By: [name] PA-C Entered By: [name] on 02/15/2022 09:24:47 -------------------------------------------------------------------------------- Debridement Details Patient Name: Date of Service: [place], [name] 02/15/2022 9:30 A M Medical Record Number: [id number] Patient Account Number: [id number] Date of Birth/Sex: Treating RN: 1940-07-17 (81 y.o. [name] Primary Care Provider: [name] Other Clinician: Referring Provider: Treating Provider/Extender: Azucena Kuba in Treatment: 17 Debridement Performed for Assessment: Wound #6 Left,Lateral Foot Performed By: Physician [name], PA Debridement Type: Debridement Level of Consciousness (Pre-procedure): Awake and Alert Pre-procedure Verification/Time Out Yes - 09:44 Taken: Start Time: 09:45 Pain Control: Lidocaine 4% T opical Solution T Area Debrided (L x W): otal 5.3 (cm) x 3 (cm) = 15.9 (cm) Tissue and other material debrided: Non-Viable, Slough, Subcutaneous, Biofilm, Slough Level: Skin/Subcutaneous Tissue Debridement Description: Excisional Instrument: Curette Bleeding: Minimum Hemostasis Achieved: Pressure End Time: 09:51 Response to Treatment: Procedure was tolerated well Level of Consciousness (Post- Awake and Alert procedure): Post Debridement Measurements of Total Wound Length: (cm)  5.3 Width: (cm) 3 Depth: (cm) 0.2 Volume: (cm) 2.498 Character of Wound/Ulcer Post Debridement: Stable Post Procedure Diagnosis Same as Pre-procedure Electronic Signature(s) Signed: 02/15/2022 4:41:40 PM By: [name] Signed: 02/15/2022 7:10:08 PM By: [name] PA-C Entered By: [name] on 02/15/2022 09:52:58 -------------------------------------------------------------------------------- HPI Details Patient Name: Date of Service: [name], [name] 02/15/2022 9:30 A M Medical Record Number: [id number] Patient Account Number: [id number] Date of Birth/Sex: Treating RN: 1941-03-11 (81 y.o. M) Primary Care Provider: [name] Other Clinician: Referring Provider: Treating Provider/Extender: Azucena Kuba in Treatment: 17 History of Present Illness HPI Description: 81 year old gentleman known to have swelling of his left lower extremity with some ulceration along the medial ankle has been having these problems for the last 3-4 months and does not know how it came on. No history of injury or no history of any infection in this area. He is known to have diabetes mellitus controlled with diet, hyperlipidemia, hypertension and chronic lumbar back pain with sciatica which is being treated by his PCP. Last hemoglobin A1c was 6.3 in June 2017. Last medical history is significant for esophageal stricture, hiatal hernia, vitamin D deficiency, status post back surgery 3, hernia repair as a child for both groins, hiatal hernia repair, joint replacement and revision of a knee surgery on the right side. He has quit smoking in 1982. He has never had a Doppler study of his lower extremity except remotely when he had knee surgery they looked for a blood clot on the right side. 09/13/2016 -- venous reflux study done on 09/12/2016 showed there is evidence of greater saphenous vein reflux in the left lower extremity and the small saphenous vein and the posterior  thigh is not competent. 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 With these results he definitely needs a referral to the vascular surgeons 09/20/2016 -- he has an appointment to see the vascular surgeons on May 1 10/03/2016 -- patient  had a 4 layer compression on his left lower extremity and had a lot of discomfort and pain. 10/11/2016-- was seen by Dr. Althea [name] -- review of his data he recommended staged laser ablation of his great and then small saphenous veins for reduction odd of his venous hypertension. He did examine his right leg with the SonoSite and he has dilatation of the saphenous vein on the right lower extremity too. Since there was no evidence of ulceration he recommended observation of the right lower extremity. He recommended to continue with local wound care at the wound center until his wound was completely healed. 10/18/2016 -- the patient has not been very compliant with his compression, his diet and his diuretics and as a result his lymphedema has increased markedly 10/25/2016 -- he has been compliant this week, has worn his 2 press compression wraps, taken his diuretics and is watching his salt intake. 11/08/2016 -- he had his venous procedure done last week and details of this have been reported and reviewed in his electronic medical record. he had a laser ablation of his great saphenous vein and this was done from mid calf to just below the saphenofemoral junction. He would return next week for ultrasound follow-up. He will then undergo the small saphenous vein ablation in a few weeks. 11/15/2016 -- his postprocedure visit showed good closure of his left great saphenous vein from the mid calf to 1-1/2 cm from the saphenofemoral junction. He had excellent early results from the ablation. The ablation of the left small saphenous vein was planned in a week 12/06/2016 -- he had his small saphenous vein venous ablation a week before and the duplex showed  closure of his small saphenous vein to within half centimeter office saphenous popliteal junction with no DVT and he was pleased with the results. He recommended continue with elevation and compression and to see them back on an as-needed basis 12/19/16; the patient's wound is actually closed. Sometime over the weekend he developed a small abrasion on the lower calf just above the original wound on the left medial malleolus although this is closed as well Readmission: 01/18/18 on evaluation today patient presents after having last been seen in our clinic July 2018. Subsequently he is a reoccurrence of the ulcers on the left ankle both medial and now a new area lateral that had been present back room for about a month he tells me. He states that this has done very well for about a year he really does not know of any injury or anything that happened that would have caused this reopening at this point. He has continued to wear compression stockings which is appropriate. He does not have lymphedema pumps. He has continued to also keep the area clean and dry as best he could. With that being said now that is been reopened is been harder for him to take care of this as far as compression stockings are concerned keeping them clean along with continuing to manage his fluid and swelling. His ABI appears to be great on the left registering at 1.14 today. He has previously undergone a venous ablation. Other than compression/lymphedema pumps he's really done everything that he can to help manage and prevent these ulcers from forming. 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 01/30/18 on evaluation today patient appears to actually be doing very well in regard to his medial ankle ulcer. It's the lateral ulcer that actually is not doing quite as well today. Fortunately he did have some alginate  left ovary start using this on the wounds and the medial ankle actually seems to be doing much better. The lateral ankle does actually  need something to con a help with slough and buildup. The collagen really did not seem to do much for him in that regard. 02/13/18 on evaluation today patient appears to be doing about the same in regard to his ulcers. Unfortunately I do not feel like were making good progress at this time. When I have debrided the wound the slough seems to come back fairly quickly in the lateral ankle ulcer. There does not appear to be any evidence of infection at this time. No fevers, chills, nausea, or vomiting noted at this time. 02/27/18 on evaluation today patient actually appears to be showing some signs of improvement in regard to both wound areas. I'm insurance on the medial aspect of his left lower extremity at the ankle whether or not the alginate may be sticking and causing some tearing off of new skin attempting to grow. With that being said the patient states it does not seem to be doing such just pulls up some of the dried dead skin around. Nonetheless this is something I wanted to watch out for going forward and actually I recommended that he take the dressing off in the shower when you could get it completely wet before removal to see how this does. Subsequently in regard to the lateral ankle ulcer he still has slough noted at this point although I do believe that he is tolerating the Medihoney much better I wish you could have gotten the Santyl but unfortunately the Santyl was too expensive gonna cost him $250. 03/06/18 on evaluation today patient appears to be doing about the same in regard to both ulcers of his left medial and lateral malleolus sites. He's been tolerating the dressing changes without complication. Unfortunately things do not seem to be improving in regard to either side at this time. Overall I think we may need to switch things up a little bit as far as the way we are performing the dressing changes currently. 03/27/18 on evaluation today patient's wound bed actually appears to be doing  much better at both locations in regard to his ankle ulcers. He has been tolerating the dressing changes at this time without complication. Overall I'm very pleased with how things appear. The patient likewise states that he's much better in regard to discomfort at this time. 04/10/18 on evaluation today patient actually appears to be doing well in regard to his left lateral ankle ulcer. He has been tolerating the dressing changes without complication. Fortunately there does not appear to be any evidence of infection. Overall I do feel like he is tolerating the Medihoney without complication. 04/24/18 upon evaluation today patient's wound actually appears to be healed on the lateral aspect of his ankle the medial aspect still continues to remain close without any evidence of complication at this time. Overall I have been extremely pleased with how things have progressed up to this point. The patient likewise is very happy he's not having any significant pain this time. He does wears compression stocking on a regular basis. Readmission: 10-19-2021 upon evaluation today patient presents for evaluation here in the clinic concerning issues with a left lateral ulcer as well as a right lateral ulcer both along the aspect of his foot getting close to the ankle. This left area is the same place that I treated back in 2024. This does appear to be more of  a vasculitis type situation based on what I am seeing. This appears to be very inflamed and is also very painful. In the past he has been completely unable to tolerate the use of any compression wraps unfortunately. There does not appear to be any evidence of active infection locally nor systemically at this time which is good news. I do believe however we need to try to see what we can do to calm down the inflammation I think triamcinolone topically as well as oral prednisone would likely be indicated in this case. Patient's medical history really has not  changed significantly since I last saw him in actually 2019 though I think is stated 2020 above. 10-26-21 upon evaluation today patient appears to still be having a lot of irritation and inflammation in regard to the ankles laterally. The left is greater than the right. With that being said I do believe that he would benefit from a biopsy to confirm whether or not this may indeed be vasculitis. It actually seems like that physically and on examination but again I want to be sure that we are on the right track as far as treatment is concerned. The good news is he did not have a DVT I called him about that on Friday this was all in regard to the right leg. Overall I am very pleased in that regard. 11-02-2021 upon evaluation today patient appears to be doing poorly still in regard to his left lower extremity. Apparently he had a fever on Sunday which was around 101 and he was severely sick feeling and disoriented according to his wife. She came with him today because she did not think that he would actually tell me what was going on. With that being said unfortunately he did not go to the hospital he actually has been doing a little better over the past few days he been taking amoxicillin he has not taken any other medications orally at this point that are different. He has not been on any antibiotics either more recently other than the amoxicillin. Nonetheless he has not had a repeat in the past 3 days of that issue. 11-09-2021 upon evaluation today patient presents after having had quite an ordeal over the past several days. Subsequently yesterday I got a call from [place] here in [place] concerning the patient and the fact that he was having bleeding that been going on for the past 24 hours and was not stopping. Subsequently my advice was to have the patient go to the ER for further evaluation and treatment. It seems to me that he had a regular way part of the wound down to the point that a blood vessel  and open. Apparently he was filling up trash bags wrapped around his foot with blood. In fact his lab review which I did do him as well today showed that he went from a white hemoglobin on 09-14-2021 of 13.6 down to a hemoglobin of 11.2 on 11-08-2021 this was yesterday. Subsequently this is due to the amount of blood loss that he has had acutely. Again I do not believe he is at transfusion stage but at the same time he is extremely weak and states that a couple times when he is going to stand up he is actually fallen back into his chair. Nonetheless I do believe that he probably needs to supplement with iron and I did recommend that there are some over-the-counter products he could get in this regard. Subsequently also suggested that the patient needs  to be drinking plenty of water he apparently drinks Coke and he drinks coffee but that is about it. Subsequently following this conversation his wife was present during this time as well and I do think that he is going to try to drink more I think it is of utmost importance. With that being said I did review his PCR culture as well it was positive for 2 organisms. This was Pseudomonas and Enterobacter. Both were showing up as being very prevalent in the PCR culture and subsequently Cipro will take care of the situation which is what I am recommending at this point based on what we see. This will take the place of the Bactrim and can have him discontinue the Bactrim the only thing is he is getting need to stop taking the hydroxyzine if he has been taking it that is the Vistaril and he tells me that he only takes this when he itches he has not been taking it recently. 6/7; patient arrives in clinic today with the wound on the right foot actually looking some better the area on the left lateral looking about the same. Problematically, he is having edema fluid leakage on the medial part of the left foot and ankle causing skin breakdown but no open wound. He has  been very resistant to the notion of compression wraps. He has been using Santyl on both wound areas at home changing the dressing himself Partway through our visit today it became clear he had had a fall on Sunday 3 days ago. Since then he has had a lot of problems with his right shoulder 11-23-2021 upon evaluation today patient appears to be doing well with regard to his wound. Has been tolerating dressing changes without complication. Fortunately there does not appear to be any signs of infection he was wrapped last week when Dr. Dellia Nims saw him on the left. He was having some breakdown medially which they were concerned about. He did keep the wrap on until Monday but states it got extremely wet. For that reason I am thinking of doing a nurse visit on Friday so that we can get some of the swelling down right now by Friday we should have a lot of that out that we can switch out and put on a fresh dressing to get him through till next Wednesday. He is in agreement with giving that a try. 11-30-2021 upon evaluation today patient actually appears to be doing better with regard to the overall appearance of his wound. Fortunately there does not appear to be any signs of active infection locally or systemically which is great news. With that being said he has been tolerating the dressing changes without complication other than the fact that from Wednesday to Friday he did well with the compression wrap from Friday till yesterday he tells me the drainage was so bad and the smell was so bad he had to take this off. It does appear that he is very macerated around the lower portion down around the heel and I do think that this is an issue. Unfortunately we cannot get home health to help with this currently. 12-07-2021 upon evaluation today patient appears to be doing well currently in regard to his wound. He is actually showing signs of excellent improvement which is great news. I am very pleased with where things  stand. I do think that the progress is being made. With that being said the patient is tolerating the Santyl quite well and I think is doing  an awesome job for him. 12-14-2021 upon evaluation today patient appears to be doing well with regard to his wounds he is making progress is still draining a lot however which I completely understand. With that being said fortunately there does not appear to be any signs of active infection locally or systemically at this time. 12-21-2021 upon evaluation today patient appears to be doing well currently in regard to his wound. He is actually showing signs of significant improvement this is slow but nonetheless week by week we are seeing this improve on the lateral aspect of his foot. The measurements are little bit better but more importantly the wound surface is actually a whole lot cleaner. The patient is pleased to hear this. With that being said he tells me that it is really hard for him to see exactly what all is going on. Nonetheless I am very pleased with all the new red tissue that were seeing granulating and hopefully we get this clean shortly and we can even switch to a different dressing to try to get it to close. 12-28-2021 upon evaluation today patient appears to be doing well currently in regard to his wound is actually showing signs of improvement we are going to perform some debridement today to clearway some of the necrotic debris. Overall I think that we are at the point where we do not have to use Santyl over the home wound I think using it just on the upper portion may be appropriate if we can get this completely cleaned away. 7/6; substantial wound on the left lateral foot. Using Santyl and Hydrofera Blue there is been good improvement since the last time I saw this. The patient does not tolerate compression wraps therefore he is using Ace wraps. Fortunately he appears to be doing this fairly well 01-11-22 upon evaluation today patient appears to be  doing better in regard to his wound in general though he is having some issues here with blue-green drainage. I am going to go ahead and see about getting him sent in a prescription for gentamicin cream. 01-18-2022 upon evaluation today patient actually appears to be doing quite well in regards to his wound there green drainage we will previously done with has improved. Fortunately I do not see any evidence of active infection locally or systemically which is great news. No fever chills noted 02-01-2022 upon evaluation today patient appears to be doing well with regard to his wound. Has been tolerating the dressing changes without complication. Fortunately I see no signs of active infection locally or systemically at this time which is great news. 02-15-2022 upon evaluation today patient appears to be doing excellent in regard to his wound he is actually showing signs of good improvement. This wound is looking much better and in fact were getting close to not even needing the Santyl any longer. Overall though I think is still good to be helpful with the more proximal portion of the wound. Electronic Signature(s) Signed: 02/15/2022 10:00:02 AM By: [name] PA-C Entered By: [name] on 02/15/2022 10:00:02 -------------------------------------------------------------------------------- Physical Exam Details Patient Name: Date of Service: [place], [name] 02/15/2022 9:30 A M Medical Record Number: [id number] Patient Account Number: [id number] Date of Birth/Sex: Treating RN: [birth year] (81 y.o. M) Primary Care Provider: [name] Other Clinician: Referring Provider: Treating Provider/Extender: Azucena Kuba in Treatment: 68 Constitutional Well-nourished and well-hydrated in no acute distress. Respiratory normal breathing without difficulty. Psychiatric this patient is able  to make decisions and demonstrates good insight into disease process. Alert and  Oriented x 3. pleasant and cooperative. Notes Upon inspection patient's wound bed actually showed signs of good granulation and epithelization at this point. Fortunately there does not appear to be any signs of active infection overall I think that he is doing quite well I did perform debridement of clearway some of the necrotic debris today and he tolerated this without complication. Electronic Signature(s) Signed: 02/15/2022 10:01:17 AM By: [name] PA-C Entered By: [name] on 02/15/2022 10:01:17 -------------------------------------------------------------------------------- Physician Orders Details Patient Name: Date of Service: [place], [name] 02/15/2022 9:30 A M Medical Record Number: [id number] Patient Account Number: [id number] Date of Birth/Sex: Treating RN: Mar 29, 1941 (81 y.o. 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 - 03/01/22 @ 9:30AM with Bernette Redbird, Room 7) Return A Return appointment in 1 month. - 03/15/22 @ 9:30am Other: - [employer]:Supplies Cellular or Tissue Based Products Cellular or Tissue Based Product Type: - Will run IVR for Apligraf= $295 [employer] Bathing/ Shower/ Hygiene May shower and wash wound with soap and water. - wash with antibacterial soap when changing dressing Edema Control  - Lymphedema / SCD / Other Elevate legs to the level of the heart or above for 30 minutes daily and/or when sitting, a frequency of: - throughout the day Patient to wear own compression stockings every day. - Use stocking daily to right leg Moisturize legs daily. - Eucerin (in jar) Additional Orders / Instructions Follow Nutritious Diet Wound Treatment Wound #6 - Foot Wound Laterality: Left, Lateral Cleanser: Soap and Water Every Other Day/30 Days Discharge Instructions: May shower and wash wound with dial antibacterial soap and water prior to dressing change. Peri-Wound Care: Zinc Oxide Ointment 30g tube Every Other Day/30 Days Discharge Instructions: Apply Zinc Oxide to periwound with each dressing change Topical: Gentamicin Every Other Day/30 Days Discharge Instructions: Apply over entire wound and around edges Prim Dressing: Hydrofera Blue Ready Foam, 4x5 in Every Other Day/30 Days ary Discharge Instructions: Apply to wound bed as instructed Prim Dressing: Santyl Ointment Every Other Day/30 Days ary Discharge Instructions: Apply to upper portion of wound only Secondary Dressing: ABD Pad, 5x9 (Generic) Every Other Day/30 Days Discharge Instructions: Apply over primary dressing as directed. Secured With: Elastic Bandage 4 inch (ACE bandage) (Generic) Every Other Day/30 Days Discharge Instructions: Secure with ACE bandage as directed. Secured With: [employer], 4.5x3.1 (in/yd) (Generic) Every Other Day/30 Days Discharge Instructions: Secure with Kerlix as directed. Secured With: 65M [employer] Public affairs consultant Surgical T 2x10 (in/yd) (Generic) Every Other Day/30 Days ape Discharge Instructions: Secure with tape as directed. Electronic Signature(s) Signed: 02/15/2022 4:41:40 PM By: [name] Signed: 02/15/2022 7:10:08 PM By: [name] PA-C Entered By: [name] on 02/15/2022 09:58:15 -------------------------------------------------------------------------------- Problem  List Details Patient Name: Date of Service: [place], [name] 02/15/2022 9:30 A M Medical Record Number: [id number] Patient Account Number: [id number]  Date of Birth/Sex: Treating RN: 03/09/1941 (81 y.o. 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 10/19/2021 No Yes L97.522 Non-pressure chronic ulcer of other part of left foot with fat layer exposed 10/19/2021 No Yes L97.512 Non-pressure chronic ulcer of other part of right foot with fat layer exposed 10/19/2021 No Yes E11.622 Type 2 diabetes mellitus with other skin ulcer 10/19/2021 No Yes Inactive Problems Resolved Problems Electronic Signature(s) Signed: 02/15/2022 9:24:37 AM By: [name] PA-C Entered By: [name] on 02/15/2022 09:24:37 -------------------------------------------------------------------------------- Progress Note Details Patient Name: Date of Service: [place], [name] 02/15/2022 9:30 A M Medical Record Number: [id number] Patient Account Number: [id number] Date of Birth/Sex: Treating RN: Mar 10, 1941 (81 y.o. M) Primary Care Provider: [name] Other Clinician: Referring Provider: Treating Provider/Extender: Azucena Kuba in Treatment: 17 Subjective Chief Complaint Information obtained from Patient Bilateral foot ulcers History of Present Illness (HPI) 80 year old gentleman known to have swelling of his left lower extremity with some ulceration along the medial ankle has been having these problems for the last 3-4 months and does not know how it  came on. No history of injury or no history of any infection in this area. He is known to have diabetes mellitus controlled with diet, hyperlipidemia, hypertension and chronic lumbar back pain with sciatica which is being treated by his PCP. Last hemoglobin A1c was 6.3 in June 2017. Last medical history is significant for esophageal stricture, hiatal hernia, vitamin D deficiency, status post back surgery o3, hernia repair as a child for both groins, hiatal hernia repair, joint replacement and revision of a knee surgery on the right side. He has quit smoking in 1982. He has never had a Doppler study of his lower extremity except remotely when he had knee surgery they looked for a blood clot on the right side. 09/13/2016 -- venous reflux study done on 09/12/2016 showed there is evidence of greater saphenous vein reflux in the left lower extremity and the small saphenous vein and the posterior thigh is not competent. 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 With these results he definitely needs a referral to the vascular surgeons 09/20/2016 -- he has an appointment to see the vascular surgeons on May 1 10/03/2016 -- patient had a 4 layer compression on his left lower extremity and had a lot of discomfort and pain. 10/11/2016-- was seen by Dr. Althea [name] -- review of his data he recommended staged laser ablation of his great and then small saphenous veins for reduction odd of his venous hypertension. He did examine his right leg with the SonoSite and he has dilatation of the saphenous vein on the right lower extremity too. Since there was no evidence of ulceration he recommended observation of the right lower extremity. He recommended to continue with local wound care at the wound center until his wound was completely healed. 10/18/2016 -- the patient has not been very compliant with his compression, his diet and his diuretics and as a result his lymphedema has increased  markedly 10/25/2016 -- he has been compliant this week, has worn his 2 press compression wraps, taken his diuretics  and is watching his salt intake. 11/08/2016 -- he had his venous procedure done last week and details of this have been reported and reviewed in his electronic medical record. he had a laser ablation of his great saphenous vein and this was done from mid calf to just below the saphenofemoral junction. He would return next week for ultrasound follow-up. He will then undergo the small saphenous vein ablation in a few weeks. 11/15/2016 -- his postprocedure visit showed good closure of his left great saphenous vein from the mid calf to 1-1/2 cm from the saphenofemoral junction. He had excellent early results from the ablation. The ablation of the left small saphenous vein was planned in a week 12/06/2016 -- he had his small saphenous vein venous ablation a week before and the duplex showed closure of his small saphenous vein to within half centimeter office saphenous popliteal junction with no DVT and he was pleased with the results. He recommended continue with elevation and compression and to see them back on an as-needed basis 12/19/16; the patient's wound is actually closed. Sometime over the weekend he developed a small abrasion on the lower calf just above the original wound on the left medial malleolus although this is closed as well Readmission: 01/18/18 on evaluation today patient presents after having last been seen in our clinic July 2018. Subsequently he is a reoccurrence of the ulcers on the left ankle both medial and now a new area lateral that had been present back room for about a month he tells me. He states that this has done very well for about a year he really does not know of any injury or anything that happened that would have caused this reopening at this point. He has continued to wear compression stockings which is appropriate. He does not have lymphedema pumps. He has  continued to also keep the area clean and dry as best he could. With that being said now that is been reopened is been harder for him to take care of this as far as compression stockings are concerned keeping them clean along with continuing to manage his fluid and swelling. His ABI appears to be great on the left registering at 1.14 today. He has previously undergone a venous ablation. Other than compression/lymphedema pumps he's really done everything that he can to help manage and prevent these ulcers from forming. 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 01/30/18 on evaluation today patient appears to actually be doing very well in regard to his medial ankle ulcer. It's the lateral ulcer that actually is not doing quite as well today. Fortunately he did have some alginate left ovary start using this on the wounds and the medial ankle actually seems to be doing much better. The lateral ankle does actually need something to con a help with slough and buildup. The collagen really did not seem to do much for him in that regard. 02/13/18 on evaluation today patient appears to be doing about the same in regard to his ulcers. Unfortunately I do not feel like were making good progress at this time. When I have debrided the wound the slough seems to come back fairly quickly in the lateral ankle ulcer. There does not appear to be any evidence of infection at this time. No fevers, chills, nausea, or vomiting noted at this time. 02/27/18 on evaluation today patient actually appears to be showing some signs of improvement in regard to both wound areas. I'm insurance on the medial aspect of his left lower  extremity at the ankle whether or not the alginate may be sticking and causing some tearing off of new skin attempting to grow. With that being said the patient states it does not seem to be doing such just pulls up some of the dried dead skin around. Nonetheless this is something I wanted to watch out for going forward  and actually I recommended that he take the dressing off in the shower when you could get it completely wet before removal to see how this does. Subsequently in regard to the lateral ankle ulcer he still has slough noted at this point although I do believe that he is tolerating the Medihoney much better I wish you could have gotten the Santyl but unfortunately the Santyl was too expensive gonna cost him $250. 03/06/18 on evaluation today patient appears to be doing about the same in regard to both ulcers of his left medial and lateral malleolus sites. He's been tolerating the dressing changes without complication. Unfortunately things do not seem to be improving in regard to either side at this time. Overall I think we may need to switch things up a little bit as far as the way we are performing the dressing changes currently. 03/27/18 on evaluation today patient's wound bed actually appears to be doing much better at both locations in regard to his ankle ulcers. He has been tolerating the dressing changes at this time without complication. Overall I'm very pleased with how things appear. The patient likewise states that he's much better in regard to discomfort at this time. 04/10/18 on evaluation today patient actually appears to be doing well in regard to his left lateral ankle ulcer. He has been tolerating the dressing changes without complication. Fortunately there does not appear to be any evidence of infection. Overall I do feel like he is tolerating the Medihoney without complication. 04/24/18 upon evaluation today patient's wound actually appears to be healed on the lateral aspect of his ankle the medial aspect still continues to remain close without any evidence of complication at this time. Overall I have been extremely pleased with how things have progressed up to this point. The patient likewise is very happy he's not having any significant pain this time. He does wears compression stocking  on a regular basis. Readmission: 10-19-2021 upon evaluation today patient presents for evaluation here in the clinic concerning issues with a left lateral ulcer as well as a right lateral ulcer both along the aspect of his foot getting close to the ankle. This left area is the same place that I treated back in 2024. 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 This appears to be very inflamed and is also very painful. In the past he has been completely unable to tolerate the use of any compression wraps unfortunately. There does not appear to be any evidence of active infection locally nor systemically at this time which is good news. I do believe however we need to try to see what we can do to calm down the inflammation I think triamcinolone topically as well as oral prednisone would likely be indicated in this case. Patient's medical history really has not changed significantly since I last saw him in actually 2019 though I think is stated 2020 above. 10-26-21 upon evaluation today patient appears to still be having a lot of irritation and inflammation in regard to the ankles laterally. The left is greater than the right. With that being said  I do believe that he would benefit from a biopsy to confirm whether or not this may indeed be vasculitis. It actually seems like that physically and on examination but again I want to be sure that we are on the right track as far as treatment is concerned. The good news is he did not have a DVT I called him about that on Friday this was all in regard to the right leg. Overall I am very pleased in that regard. 11-02-2021 upon evaluation today patient appears to be doing poorly still in regard to his left lower extremity. Apparently he had a fever on Sunday which was around 101 and he was severely sick feeling and disoriented according to his wife. She came with him today because she did not think that he would actually tell  me what was going on. With that being said unfortunately he did not go to the hospital he actually has been doing a little better over the past few days he been taking amoxicillin he has not taken any other medications orally at this point that are different. He has not been on any antibiotics either more recently other than the amoxicillin. Nonetheless he has not had a repeat in the past 3 days of that issue. 11-09-2021 upon evaluation today patient presents after having had quite an ordeal over the past several days. Subsequently yesterday I got a call from [place] here in [place] concerning the patient and the fact that he was having bleeding that been going on for the past 24 hours and was not stopping. Subsequently my advice was to have the patient go to the ER for further evaluation and treatment. It seems to me that he had a regular way part of the wound down to the point that a blood vessel and open. Apparently he was filling up trash bags wrapped around his foot with blood. In fact his lab review which I did do him as well today showed that he went from a white hemoglobin on 09-14-2021 of 13.6 down to a hemoglobin of 11.2 on 11-08-2021 this was yesterday. Subsequently this is due to the amount of blood loss that he has had acutely. Again I do not believe he is at transfusion stage but at the same time he is extremely weak and states that a couple times when he is going to stand up he is actually fallen back into his chair. Nonetheless I do believe that he probably needs to supplement with iron and I did recommend that there are some over-the-counter products he could get in this regard. Subsequently also suggested that the patient needs to be drinking plenty of water he apparently drinks Coke and he drinks coffee but that is about it. Subsequently following this conversation his wife was present during this time as well and I do think that he is going to try to drink more I think it is of utmost  importance. With that being said I did review his PCR culture as well it was positive for 2 organisms. This was Pseudomonas and Enterobacter. Both were showing up as being very prevalent in the PCR culture and subsequently Cipro will take care of the situation which is what I am recommending at this point based on what we see. This will take the place of the Bactrim and can have him discontinue the Bactrim the only thing is he is getting need to stop taking the hydroxyzine if he has been taking it that is the Vistaril and he  tells me that he only takes this when he itches he has not been taking it recently. 6/7; patient arrives in clinic today with the wound on the right foot actually looking some better the area on the left lateral looking about the same. Problematically, he is having edema fluid leakage on the medial part of the left foot and ankle causing skin breakdown but no open wound. He has been very resistant to the notion of compression wraps. He has been using Santyl on both wound areas at home changing the dressing himself Partway through our visit today it became clear he had had a fall on Sunday 3 days ago. Since then he has had a lot of problems with his right shoulder 11-23-2021 upon evaluation today patient appears to be doing well with regard to his wound. Has been tolerating dressing changes without complication. Fortunately there does not appear to be any signs of infection he was wrapped last week when Dr. Dellia Nims saw him on the left. He was having some breakdown medially which they were concerned about. He did keep the wrap on until Monday but states it got extremely wet. For that reason I am thinking of doing a nurse visit on Friday so that we can get some of the swelling down right now by Friday we should have a lot of that out that we can switch out and put on a fresh dressing to get him through till next Wednesday. He is in agreement with giving that a try. 11-30-2021 upon  evaluation today patient actually appears to be doing better with regard to the overall appearance of his wound. Fortunately there does not appear to be any signs of active infection locally or systemically which is great news. With that being said he has been tolerating the dressing changes without complication other than the fact that from Wednesday to Friday he did well with the compression wrap from Friday till yesterday he tells me the drainage was so bad and the smell was so bad he had to take this off. It does appear that he is very macerated around the lower portion down around the heel and I do think that this is an issue. Unfortunately we cannot get home health to help with this currently. 12-07-2021 upon evaluation today patient appears to be doing well currently in regard to his wound. He is actually showing signs of excellent improvement which is great news. I am very pleased with where things stand. I do think that the progress is being made. With that being said the patient is tolerating the Santyl quite well and I think is doing an awesome job for him. 12-14-2021 upon evaluation today patient appears to be doing well with regard to his wounds he is making progress is still draining a lot however which I completely understand. With that being said fortunately there does not appear to be any signs of active infection locally or systemically at this time. 12-21-2021 upon evaluation today patient appears to be doing well currently in regard to his wound. He is actually showing signs of significant improvement this is slow but nonetheless week by week we are seeing this improve on the lateral aspect of his foot. The measurements are little bit better but more importantly the wound surface is actually a whole lot cleaner. The patient is pleased to hear this. With that being said he tells me that it is really hard for him to see exactly what all is going on. Nonetheless I am very  pleased with all  the new red tissue that were seeing granulating and hopefully we get this clean shortly and we can even switch to a different dressing to try to get it to close. 12-28-2021 upon evaluation today patient appears to be doing well currently in regard to his wound is actually showing signs of improvement we are going to perform some debridement today to clearway some of the necrotic debris. Overall I think that we are at the point where we do not have to use Santyl over the home wound I think using it just on the upper portion may be appropriate if we can get this completely cleaned away. 7/6; substantial wound on the left lateral foot. Using Santyl and Hydrofera Blue there is been good improvement since the last time I saw this. The patient does not tolerate compression wraps therefore he is using Ace wraps. Fortunately he appears to be doing this fairly well 01-11-22 upon evaluation today patient appears to be doing better in regard to his wound in general though he is having some issues here with blue-green drainage. I am going to go ahead and see about getting him sent in a prescription for gentamicin cream. 01-18-2022 upon evaluation today patient actually appears to be doing quite well in regards to his wound there green drainage we will previously done with has improved. Fortunately I do not see any evidence of active infection locally or systemically which is great news. No fever chills noted 02-01-2022 upon evaluation today patient appears to be doing well with regard to his wound. Has been tolerating the dressing changes without complication. Fortunately I see no signs of active infection locally or systemically at this time which is great news. 02-15-2022 upon evaluation today patient appears to be doing excellent in regard to his wound he is actually showing signs of good improvement. This wound is looking much better and in fact were getting close to not even needing the Santyl any longer. Overall  though I think is still good to be helpful with the more proximal portion of the wound. Objective Constitutional Well-nourished and well-hydrated in no acute distress. Vitals Time Taken: 9:21 AM, Height: 66 in, Weight: 184 lbs, BMI: 29.7, Temperature: 98.1 F, Pulse: 65 bpm, Respiratory Rate: 18 breaths/min, Blood Pressure: 148/71 mmHg. Respiratory normal breathing without difficulty. Psychiatric this patient is able to make decisions and demonstrates good insight into disease process. Alert and Oriented x 3. pleasant and cooperative. General Notes: Upon inspection patient's wound bed actually showed signs of good granulation and epithelization at this point. Fortunately there does not appear to be any signs of active infection overall I think that he is doing quite well I did perform debridement of clearway some of the necrotic debris today and he tolerated this without complication. Integumentary (Hair, Skin) Wound #6 status is Open. Original cause of wound was Gradually Appeared. The date acquired was: 07/25/2021. The wound has been in treatment 17 weeks. The wound is located on the Left,Lateral Foot. The wound measures 5.3cm length x 3cm width x 0.2cm depth; 12.488cm^2 area and 2.498cm^3 volume. There is Fat Layer (Subcutaneous Tissue) exposed. There is no tunneling or undermining noted. There is a large amount of serosanguineous drainage noted. The wound margin is distinct with the outline attached to the wound base. There is large (67-100%) red, pink, hyper - granulation within the wound bed. There is a small (1-33%) amount of necrotic tissue within the wound bed including Adherent Slough. 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 There was a Excisional Skin/Subcutaneous Tissue Debridement with a total area of 15.9 sq cm performed by [name], PA. With the following instrument(s): Curette to remove Non-Viable tissue/material. Material removed includes Subcutaneous Tissue, Slough, and Biofilm after achieving pain control using Lidocaine 4% T opical Solution. No specimens were taken. A time out was conducted at 09:44, prior to the start of the procedure. A Minimum amount of bleeding was controlled with Pressure. The procedure was tolerated well. Post Debridement Measurements: 5.3cm length x 3cm width x 0.2cm depth; 2.498cm^3 volume. Character of Wound/Ulcer Post Debridement is stable. Post procedure Diagnosis Wound #6: Same as Pre-Procedure Plan Follow-up Appointments: Return Appointment in 2 weeks. - 03/01/22 @ 9:30AM with Bernette Redbird, Room 7) Return appointment in 1 month. - 03/15/22 @ 9:30am Other: - [employer]:Supplies Cellular or Tissue Based Products: Cellular or Tissue Based Product Type: - Will run IVR for A[employer]ig[employer]= $295 [employer] Bathing/ Shower/ Hygiene: May shower and wash wound with soap and water. - wash with antibacterial soap when changing dressing Edema Control - Lymphedema / SCD / Other: Elevate legs to the level of the heart or above for 30 minutes daily and/or when sitting, a frequency of: - throughout the day Patient to wear own compression stockings every day. - Use stocking daily to right leg Moisturize legs daily. - Eucerin (in jar) Additional Orders / Instructions: Follow Nutritious Diet WOUND #6: - Foot Wound Laterality: Left, Lateral Cleanser: Soap and Water Every Other Day/30 Days Discharge Instructions: May shower and wash wound with dial antibacterial soap and water prior to  dressing change. Peri-Wound Care: Zinc Oxide Ointment 30g tube Every Other Day/30 Days Discharge Instructions: Apply Zinc Oxide to periwound with each dressing change Topical: Gentamicin Every Other Day/30 Days Discharge Instructions: Apply over entire wound and around edges Prim Dressing: Hydrofera Blue Ready Foam, 4x5 in Every Other Day/30 Days ary Discharge Instructions: Apply to wound bed as instructed Prim Dressing: Santyl Ointment Every Other Day/30 Days ary Discharge Instructions: Apply to upper portion of wound only Secondary Dressing: ABD Pad, 5x9 (Generic) Every Other Day/30 Days Discharge Instructions: Apply over primary dressing as directed. Secured With: Elastic Bandage 4 inch (ACE bandage) (Generic) Every Other Day/30 Days Discharge Instructions: Secure with ACE bandage as directed. Secured With: [employer], 4.5x3.1 (in/yd) (Generic) Every Other Day/30 Days Discharge Instructions: Secure with Kerlix as directed. Secured With: 84M Medipore Public affairs consultant Surgical T 2x10 (in/yd) (Generic) Every Other Day/30 Days ape Discharge Instructions: Secure with tape as directed. 1. I would recommend currently that we go ahead and continue with the Santyl just on the upper portion of the wound which I think is doing quite well. 2. Also can recommend that we have the patient continue to monitor for any signs of infection 3. I am also going to recommend that the patient should continue to elevate his legs much as possible to help with edema control. We will see patient back for reevaluation in 1 week here in the clinic. If anything worsens or changes patient will contact our office for additional recommendations. Electronic Signature(s) Signed:  02/15/2022 7:00:37 PM By: [name] PA-C Entered By: [name] on 02/15/2022 19:00:37 -------------------------------------------------------------------------------- SuperBill Details Patient Name: Date of Service: [place], [name] NA LD  C. 02/15/2022 Medical Record Number: [id number] Patient Account Number: [id number] Date of Birth/Sex: Treating RN: 1941/03/31 (81 y.o. 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 layer exposed E11.622 Type 2 diabetes mellitus with other skin ulcer Facility Procedures CPT4 Code: [id number] Description: 25749 - DEB SUBQ TISSUE 20 SQ CM/< ICD-10 Diagnosis Description L97.522 Non-pressure chronic ulcer of other part of left foot with fat layer exposed Modifier: Quantity: 1 Physician Procedures : CPT4 Code Description Modifier [id number] - WC PHYS SUBQ TISS 20 SQ CM ICD-10 Diagnosis Description L97.522 Non-pressure chronic ulcer of other part of left foot with fat layer exposed Quantity: 1 Electronic Signature(s) Signed: 02/15/2022 7:00:51 PM By: [name] PA-C Previous Signature: 02/15/2022 4:41:40 PM Version By: [name] Entered By: [name] on 02/15/2022 19:00:50

## 2022-02-15 NOTE — Progress Notes (Signed)
KEEGEN, HEFFERN ([id number]) Visit Report for 02/15/2022 Arrival Information Details Patient Name: Date of Service: [name] Fernandez, [name] [place] LD C. 02/15/2022 9:30 A M Medical Record Number: [id number] Patient Account Number: [id number] Date of Birth/Sex: Treating RN: [birth year] (81 y.o. [name] Fernandez Primary Care [name] Fernandez: [name] Other Clinician: Referring [name] Fernandez: Treating [name] Fernandez/Extender: Azucena Kuba in Treatment: 17 Visit Information History Since Last Visit Added or deleted any medications: No Patient Arrived: Ambulatory Any new allergies or adverse reactions: No Arrival Time: 09:19 Had a fall or experienced change in No Transfer Assistance: None activities of daily living that may affect Patient Identification Verified: Yes risk of falls: Secondary Verification Process Completed: Yes Signs or symptoms of abuse/neglect since last visito No Patient Requires Transmission-Based Precautions: No Hospitalized since last visit: No Patient Has Alerts: No Implantable device outside of the clinic excluding No cellular tissue based products placed in the center since last visit: Has Dressing in Place as Prescribed: Yes Has Compression in Place as Prescribed: Yes Pain Present Now: No Electronic Signature(s) Signed: 02/15/2022 4:41:40 PM By: [name] Entered By: [name] on 02/15/2022 09:19:33 -------------------------------------------------------------------------------- Complex / Palliative Patient Assessment Details Patient Name: Date of Service: [name] Fernandez, [name] [name] 02/15/2022 9:30 A M Medical Record Number: [id number] Patient Account Number: [id number] Date of Birth/Sex: Treating RN: September 02, 1940 (81 y.o. [name] Fernandez Primary Care [name] Fernandez: [name] Other Clinician: Referring Armonie Mettler: Treating [name] Fernandez/Extender: Azucena Kuba in Treatment: 17 Complex Wound Management Criteria Patient has  remarkable or complex co-morbidities requiring medications or treatments that extend wound healing times. Examples: Diabetes mellitus with chronic renal failure or end stage renal disease requiring dialysis Advanced or poorly controlled rheumatoid arthritis Diabetes mellitus and end stage chronic obstructive pulmonary disease Active cancer with current chemo- or radiation therapy Vasculitis, Venous Hypertension Palliative Wound Management Criteria Care Approach Wound Care Plan: Complex Wound Management Electronic Signature(s) Signed: 02/15/2022 10:40:53 AM By: [name] Signed: 02/15/2022 7:10:08 PM By: [name] PA-C Entered By: [name] on 02/15/2022 10:40:52 -------------------------------------------------------------------------------- Encounter Discharge Information Details Patient Name: Date of Service: [name] Fernandez, [name] [name] 02/15/2022 9:30 A M Medical Record Number: [id number] Patient Account Number: [id number] Date of Birth/Sex: Treating RN: [birth year] (81 y.o. [name] Fernandez Primary Care Jaeanna Mccomber: [name] Other Clinician: Referring Vearl Allbaugh: Treating [name] Fernandez/Extender: Azucena Kuba in Treatment: 17 Encounter Discharge Information Items Post Procedure Vitals Discharge Condition: Stable Temperature (F): 98.1 Ambulatory Status: Ambulatory Pulse (bpm): 65 Discharge Destination: Home Respiratory Rate (breaths/min): 18 Transportation: Private Auto Blood Pressure (mmHg): 148/71 Schedule Follow-up Appointment: Yes Clinical Summary of Care: Provided on 02/15/2022 Form Type Recipient Paper Patient Patient Electronic Signature(s) Signed: 02/15/2022 4:41:40 PM By: [name] Entered By: [name] on 02/15/2022 10:13:06 -------------------------------------------------------------------------------- Lower Extremity Assessment Details Patient Name: Date of Service: [name] Fernandez, [name] [name] 02/15/2022 9:30 A M Medical Record  Number: [id number] Patient Account Number: [id number] Date of Birth/Sex: Treating RN: 1940-08-18 (81 y.o. [name] Fernandez Primary Care [name] Fernandez: [name] Other Clinician: Referring Davida Falconi: Treating Ciel Yanes/Extender: Azucena Kuba in Treatment: 17 Edema Assessment Assessed: [Left: Yes] [name] Fernandez: No] Edema: [Left: Ye] [Right: s] Calf Left: Right: Point of Measurement: 32 cm From Medial Instep 35.5 cm Ankle Left: Right: Point of Measurement: 10 cm From Medial Instep 24 cm Vascular Assessment Pulses: Dorsalis Pedis Palpable: [Left:Yes] Electronic Signature(s) Signed: 02/15/2022 4:41:40 PM By: [name] Entered By: Onnie Boer  Leveda Anna on 02/15/2022 09:30:43 -------------------------------------------------------------------------------- Multi-Disciplinary Care Plan Details Patient Name: Date of Service: [name] Fernandez, [name] [place] LD C. 02/15/2022 9:30 A M Medical Record Number: [id number] Patient Account Number: [id number] Date of Birth/Sex: Treating RN: [birth year] (81 y.o. [name] Fernandez Primary Care Mukund Weinreb: [name] Other Clinician: Referring Jireh Elmore: Treating Grayling Schranz/Extender: Azucena Kuba in Treatment: 17 Active Inactive Venous Leg Ulcer Nursing Diagnoses: Actual venous Insuffiency (use after diagnosis is confirmed) Goals: Patient will maintain optimal edema control Date Initiated: 10/19/2021 Target Resolution Date: 03/08/2022 Goal Status: Active Interventions: Assess peripheral edema status every visit. Compression as ordered Notes: 11/16/21: Edema not controlled, patient not compliant with compression. 12/14/21:Edema not well controlled, patient will not allow compression wraps. 02/15/22: Edema better controlled, patient using ACE wrap Wound/Skin Impairment Nursing Diagnoses: Impaired tissue integrity Goals: Patient/caregiver will verbalize understanding of skin care regimen Date Initiated:  10/19/2021 Target Resolution Date: 03/08/2022 Goal Status: Active Ulcer/skin breakdown will have a volume reduction of 30% by week 4 Date Initiated: 10/19/2021 Date Inactivated: 12/14/2021 Target Resolution Date: 12/14/2021 Unmet Reason: infection, non Goal Status: Unmet compliance Interventions: Assess patient/caregiver ability to obtain necessary supplies Assess patient/caregiver ability to perform ulcer/skin care regimen upon admission and as needed Assess ulceration(s) every visit Provide education on ulcer and skin care Treatment Activities: Topical wound management initiated : 10/19/2021 Notes: 11/16/21: Wound care continues, patient not compliant with edema control. 02/15/22: Wound care regimen continues. Large co-pay for skin subs. Electronic Signature(s) Signed: 02/15/2022 4:41:40 PM By: [name] Entered By: [name] on 02/15/2022 09:18:53 -------------------------------------------------------------------------------- Pain Assessment Details Patient Name: Date of Service: [name] Fernandez, [name] [name] 02/15/2022 9:30 A M Medical Record Number: [id number] Patient Account Number: [id number] Date of Birth/Sex: Treating RN: 05/27/1941 (81 y.o. [name] Fernandez Primary Care Kayne Yuhas: [name] Other Clinician: Referring Etheleen Valtierra: Treating Keyia Moretto/Extender: Azucena Kuba in Treatment: 17 Active Problems Location of Pain Severity and Description of Pain Patient Has Paino No Site Locations Pain Management and Medication Current Pain Management: Electronic Signature(s) Signed: 02/15/2022 4:41:40 PM By: [name] Entered By: [name] on 02/15/2022 09:24:49 -------------------------------------------------------------------------------- Patient/Caregiver Education Details Patient Name: Date of Service: [name] [place] [name] 9/6/2023andnbsp9:30 A M Medical Record Number: [id number] Patient Account Number: [id number] Date of  Birth/Gender: Treating RN: 04/15/1941 (81 y.o. [name] Fernandez Primary Care Physician: [name] Other Clinician: Referring Physician: Treating Physician/Extender: Azucena Kuba in Treatment: 17 Education Assessment Education Provided To: Patient Education Topics Provided Venous: Methods: Explain/Verbal, Printed Responses: State content correctly Wound/Skin Impairment: Methods: Demonstration, Explain/Verbal, Printed Responses: State content correctly Electronic Signature(s) Signed: 02/15/2022 4:41:40 PM By: [name] Entered By: [name] on 02/15/2022 09:19:12 -------------------------------------------------------------------------------- Wound Assessment Details Patient Name: Date of Service: [name] Fernandez, [name] [name] 02/15/2022 9:30 A M Medical Record Number: [id number] Patient Account Number: [id number] Date of Birth/Sex: Treating RN: 1940-08-10 (81 y.o. [name] Fernandez Primary Care Paytyn Mesta: [name] Other Clinician: Referring Devario Bucklew: Treating Nicolis Boody/Extender: Azucena Kuba in Treatment: 17 Wound Status Wound Number: 6 Primary Vasculitis Etiology: Wound Location: Left, Lateral Foot Wound Open Wounding Event: Gradually Appeared Status: Date Acquired: 07/25/2021 Comorbid Anemia, Chronic Obstructive Pulmonary Disease (COPD), Weeks Of Treatment: 17 History: Hypertension, Peripheral Venous Disease, Type II Diabetes, Clustered Wound: No Osteoarthritis, Neuropathy Photos Wound Measurements Length: (cm) 5.3 Width: (cm) 3 Depth: (cm) 0.2 Area: (cm) 12.488 Volume: (cm) 2.498 % Reduction in Area: 35.9% % Reduction in  Volume: 35.9% Epithelialization: Medium (34-66%) Tunneling: No Undermining: No Wound Description Classification: Full Thickness Without Exposed Support Structures Wound Margin: Distinct, outline attached Exudate Amount: Large Exudate Type: Serosanguineous Exudate  Color: red, brown Foul Odor After Cleansing: No Slough/Fibrino Yes Wound Bed Granulation Amount: Large (67-100%) Exposed Structure Granulation Quality: Red, Pink, Hyper-granulation Fascia Exposed: No Necrotic Amount: Small (1-33%) Fat Layer (Subcutaneous Tissue) Exposed: Yes Necrotic Quality: Adherent Slough Tendon Exposed: No Muscle Exposed: No Joint Exposed: No Bone Exposed: No Treatment Notes Wound #6 (Foot) Wound Laterality: Left, Lateral Cleanser Soap and Water Discharge Instruction: May shower and wash wound with dial antibacterial soap and water prior to dressing change. Peri-Wound Care Zinc Oxide Ointment 30g tube Discharge Instruction: Apply Zinc Oxide to periwound with each dressing change Topical Gentamicin Discharge Instruction: Apply over entire wound and around edges Primary Dressing Hydrofera Blue Ready Foam, 4x5 in Discharge Instruction: Apply to wound bed as instructed Santyl Ointment Discharge Instruction: Apply to upper portion of wound only Secondary Dressing ABD Pad, 5x9 Discharge Instruction: Apply over primary dressing as directed. Secured With Elastic Bandage 4 inch (ACE bandage) Discharge Instruction: Secure with ACE bandage as directed. Kerlix Roll Sterile, 4.5x3.1 (in/yd) Discharge Instruction: Secure with Kerlix as directed. 18M Medipore Soft Cloth Surgical T 2x10 (in/yd) ape Discharge Instruction: Secure with tape as directed. Compression Wrap Compression Stockings Add-Ons Electronic Signature(s) Signed: 02/15/2022 4:41:40 PM By: [name] Entered By: [name] on 02/15/2022 09:33:40 -------------------------------------------------------------------------------- Vitals Details Patient Name: Date of Service: [name] Fernandez, [name] [name] 02/15/2022 9:30 A M Medical Record Number: [id number] Patient Account Number: [id number] Date of Birth/Sex: Treating RN: 10/05/1940 (81 y.o. [name] Fernandez Primary Care Fard Borunda: [name] Other Clinician: Referring Garlon Tuggle: Treating Viviann Broyles/Extender: Azucena Kuba in Treatment: 17 Vital Signs Time Taken: 09:21 Temperature (F): 98.1 Height (in): 66 Pulse (bpm): 65 Weight (lbs): 184 Respiratory Rate (breaths/min): 18 Body Mass Index (BMI): 29.7 Blood Pressure (mmHg): 148/71 Reference Range: 80 - 120 mg / dl Electronic Signature(s) Signed: 02/15/2022 4:41:40 PM By: [name] Entered By: [name] on 02/15/2022 09:23:36

## 2022-03-01 ENCOUNTER — Encounter (HOSPITAL_BASED_OUTPATIENT_CLINIC_OR_DEPARTMENT_OTHER): Payer: Medicare HMO | Admitting: Physician Assistant

## 2022-03-01 DIAGNOSIS — Z87891 Personal history of nicotine dependence: Secondary | ICD-10-CM | POA: Diagnosis not present

## 2022-03-01 DIAGNOSIS — I890 Lymphedema, not elsewhere classified: Secondary | ICD-10-CM | POA: Diagnosis not present

## 2022-03-01 DIAGNOSIS — L97522 Non-pressure chronic ulcer of other part of left foot with fat layer exposed: Secondary | ICD-10-CM | POA: Diagnosis not present

## 2022-03-01 DIAGNOSIS — L958 Other vasculitis limited to the skin: Secondary | ICD-10-CM | POA: Diagnosis not present

## 2022-03-01 DIAGNOSIS — E11622 Type 2 diabetes mellitus with other skin ulcer: Secondary | ICD-10-CM | POA: Diagnosis not present

## 2022-03-01 DIAGNOSIS — M5440 Lumbago with sciatica, unspecified side: Secondary | ICD-10-CM | POA: Diagnosis not present

## 2022-03-01 DIAGNOSIS — G8929 Other chronic pain: Secondary | ICD-10-CM | POA: Diagnosis not present

## 2022-03-01 DIAGNOSIS — I87333 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 extremity: Secondary | ICD-10-CM | POA: Diagnosis not present

## 2022-03-01 DIAGNOSIS — L97512 Non-pressure chronic ulcer of other part of right foot with fat layer exposed: Secondary | ICD-10-CM | POA: Diagnosis not present

## 2022-03-01 NOTE — Progress Notes (Addendum)
AARAN, ENBERG ([id number]) Visit Report for 03/01/2022 Chief Complaint Document Details Patient Name: Date of Service: [place], [name] 03/01/2022 9:30 A M Medical Record Number: [id number] Patient Account Number: [id number] Date of Birth/Sex: Treating RN: 1940-10-14 (81 y.o. M) Primary Care Provider: [name] Other Clinician: Referring Provider: Treating Provider/Extender: Azucena Kuba in Treatment: 19 Information Obtained from: Patient Chief Complaint Bilateral foot ulcers Electronic Signature(s) Signed: 03/01/2022 9:46:12 AM By: [name] PA-C Previous Signature: 03/01/2022 9:45:54 AM Version By: [name] PA-C Entered By: [name] on 03/01/2022 09:46:12 -------------------------------------------------------------------------------- Debridement Details Patient Name: Date of Service: [place], [name] 03/01/2022 9:30 A M Medical Record Number: [id number] Patient Account Number: [id number] Date of Birth/Sex: Treating RN: 1941/01/26 (81 y.o. Mare Ferrari Primary Care Provider: [name] Other Clinician: Referring Provider: Treating Provider/Extender: Azucena Kuba in Treatment: 19 Debridement Performed for Assessment: Wound #6 Left,Lateral Foot Performed By: Physician [name], PA Debridement Type: Debridement Level of Consciousness (Pre-procedure): Awake and Alert Pre-procedure Verification/Time Out Yes - 10:23 Taken: Start Time: 10:27 Pain Control: Lidocaine 5% topical ointment T Area Debrided (L x W): otal 5.2 (cm) x 3 (cm) = 15.6 (cm) Tissue and other material debrided: Non-Viable, Slough, Subcutaneous, Biofilm, Slough Level: Skin/Subcutaneous Tissue Debridement Description: Excisional Instrument: Curette Bleeding: Minimum Hemostasis Achieved: Pressure Procedural Pain: 0 Post Procedural Pain: 3 Response to Treatment: Procedure was tolerated well Level of  Consciousness (Post- Awake and Alert procedure): Post Debridement Measurements of Total Wound Length: (cm) 5.2 Width: (cm) 3 Depth: (cm) 0.2 Volume: (cm) 2.45 Character of Wound/Ulcer Post Debridement: Improved Post Procedure Diagnosis Same as Pre-procedure Electronic Signature(s) Signed: 03/01/2022 5:51:27 PM By: [name] PA-C Signed: 03/03/2022 10:29:43 AM By: Sharyn Creamer RN, BSN Entered By: Sharyn Creamer on 03/01/2022 10:29:50 -------------------------------------------------------------------------------- HPI Details Patient Name: Date of Service: [name], [name] 03/01/2022 9:30 A M Medical Record Number: [id number] Patient Account Number: [id number] Date of Birth/Sex: Treating RN: 12-Apr-1941 (81 y.o. M) Primary Care Provider: [name] Other Clinician: Referring Provider: Treating Provider/Extender: Azucena Kuba in Treatment: 19 History of Present Illness HPI Description: 81 year old gentleman known to have swelling of his left lower extremity with some ulceration along the medial ankle has been having these problems for the last 3-4 months and does not know how it came on. No history of injury or no history of any infection in this area. He is known to have diabetes mellitus controlled with diet, hyperlipidemia, hypertension and chronic lumbar back pain with sciatica which is being treated by his PCP. Last hemoglobin A1c was 6.3 in June 2017. Last medical history is significant for esophageal stricture, hiatal hernia, vitamin D deficiency, status post back surgery 3, hernia repair as a child for both groins, hiatal hernia repair, joint replacement and revision of a knee surgery on the right side. He has quit smoking in 1982. He has never had a Doppler study of his lower extremity except remotely when he had knee surgery they looked for a blood clot on the right side. 09/13/2016 -- venous reflux study done on 09/12/2016 showed there  is evidence of greater saphenous vein reflux in the left lower extremity and the small saphenous vein and the posterior thigh is not competent. 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 With these results he definitely needs a referral to the vascular surgeons 09/20/2016 --  he has an appointment to see the vascular surgeons on May 1 10/03/2016 -- patient had a 4 layer compression on his left lower extremity and had a lot of discomfort and pain. 10/11/2016-- was seen by Dr. Althea [name] -- review of his data he recommended staged laser ablation of his great and then small saphenous veins for reduction odd of his venous hypertension. He did examine his right leg with the SonoSite and he has dilatation of the saphenous vein on the right lower extremity too. Since there was no evidence of ulceration he recommended observation of the right lower extremity. He recommended to continue with local wound care at the wound center until his wound was completely healed. 10/18/2016 -- the patient has not been very compliant with his compression, his diet and his diuretics and as a result his lymphedema has increased markedly 10/25/2016 -- he has been compliant this week, has worn his 2 press compression wraps, taken his diuretics and is watching his salt intake. 11/08/2016 -- he had his venous procedure done last week and details of this have been reported and reviewed in his electronic medical record. he had a laser ablation of his great saphenous vein and this was done from mid calf to just below the saphenofemoral junction. He would return next week for ultrasound follow-up. He will then undergo the small saphenous vein ablation in a few weeks. 11/15/2016 -- his postprocedure visit showed good closure of his left great saphenous vein from the mid calf to 1-1/2 cm from the saphenofemoral junction. He had excellent early results from the ablation. The ablation of the left small saphenous vein was  planned in a week 12/06/2016 -- he had his small saphenous vein venous ablation a week before and the duplex showed closure of his small saphenous vein to within half centimeter office saphenous popliteal junction with no DVT and he was pleased with the results. He recommended continue with elevation and compression and to see them back on an as-needed basis 12/19/16; the patient's wound is actually closed. Sometime over the weekend he developed a small abrasion on the lower calf just above the original wound on the left medial malleolus although this is closed as well Readmission: 01/18/18 on evaluation today patient presents after having last been seen in our clinic July 2018. Subsequently he is a reoccurrence of the ulcers on the left ankle both medial and now a new area lateral that had been present back room for about a month he tells me. He states that this has done very well for about a year he really does not know of any injury or anything that happened that would have caused this reopening at this point. He has continued to wear compression stockings which is appropriate. He does not have lymphedema pumps. He has continued to also keep the area clean and dry as best he could. With that being said now that is been reopened is been harder for him to take care of this as far as compression stockings are concerned keeping them clean along with continuing to manage his fluid and swelling. His ABI appears to be great on the left registering at 1.14 today. He has previously undergone a venous ablation. Other than compression/lymphedema pumps he's really done everything that he can to help manage and prevent these ulcers from forming. 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 01/30/18 on evaluation today patient appears to actually be doing very well in regard to his medial ankle ulcer. It's the lateral ulcer  that actually is not doing quite as well today. Fortunately he did have some alginate left ovary start  using this on the wounds and the medial ankle actually seems to be doing much better. The lateral ankle does actually need something to con a help with slough and buildup. The collagen really did not seem to do much for him in that regard. 02/13/18 on evaluation today patient appears to be doing about the same in regard to his ulcers. Unfortunately I do not feel like were making good progress at this time. When I have debrided the wound the slough seems to come back fairly quickly in the lateral ankle ulcer. There does not appear to be any evidence of infection at this time. No fevers, chills, nausea, or vomiting noted at this time. 02/27/18 on evaluation today patient actually appears to be showing some signs of improvement in regard to both wound areas. I'm insurance on the medial aspect of his left lower extremity at the ankle whether or not the alginate may be sticking and causing some tearing off of new skin attempting to grow. With that being said the patient states it does not seem to be doing such just pulls up some of the dried dead skin around. Nonetheless this is something I wanted to watch out for going forward and actually I recommended that he take the dressing off in the shower when you could get it completely wet before removal to see how this does. Subsequently in regard to the lateral ankle ulcer he still has slough noted at this point although I do believe that he is tolerating the Medihoney much better I wish you could have gotten the Santyl but unfortunately the Santyl was too expensive gonna cost him $250. 03/06/18 on evaluation today patient appears to be doing about the same in regard to both ulcers of his left medial and lateral malleolus sites. He's been tolerating the dressing changes without complication. Unfortunately things do not seem to be improving in regard to either side at this time. Overall I think we may need to switch things up a little bit as far as the way we are  performing the dressing changes currently. 03/27/18 on evaluation today patient's wound bed actually appears to be doing much better at both locations in regard to his ankle ulcers. He has been tolerating the dressing changes at this time without complication. Overall I'm very pleased with how things appear. The patient likewise states that he's much better in regard to discomfort at this time. 04/10/18 on evaluation today patient actually appears to be doing well in regard to his left lateral ankle ulcer. He has been tolerating the dressing changes without complication. Fortunately there does not appear to be any evidence of infection. Overall I do feel like he is tolerating the Medihoney without complication. 04/24/18 upon evaluation today patient's wound actually appears to be healed on the lateral aspect of his ankle the medial aspect still continues to remain close without any evidence of complication at this time. Overall I have been extremely pleased with how things have progressed up to this point. The patient likewise is very happy he's not having any significant pain this time. He does wears compression stocking on a regular basis. Readmission: 10-19-2021 upon evaluation today patient presents for evaluation here in the clinic concerning issues with a left lateral ulcer as well as a right lateral ulcer both along the aspect of his foot getting close to the ankle. This left area is the  same place that I treated back in 2024. 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 This appears to be very inflamed and is also very painful. In the past he has been completely unable to tolerate the use of any compression wraps unfortunately. There does not appear to be any evidence of active infection locally nor systemically at this time which is good news. I do believe however we need to try to see what we can do to calm down the inflammation I think triamcinolone  topically as well as oral prednisone would likely be indicated in this case. Patient's medical history really has not changed significantly since I last saw him in actually 2019 though I think is stated 2020 above. 10-26-21 upon evaluation today patient appears to still be having a lot of irritation and inflammation in regard to the ankles laterally. The left is greater than the right. With that being said I do believe that he would benefit from a biopsy to confirm whether or not this may indeed be vasculitis. It actually seems like that physically and on examination but again I want to be sure that we are on the right track as far as treatment is concerned. The good news is he did not have a DVT I called him about that on Friday this was all in regard to the right leg. Overall I am very pleased in that regard. 11-02-2021 upon evaluation today patient appears to be doing poorly still in regard to his left lower extremity. Apparently he had a fever on Sunday which was around 101 and he was severely sick feeling and disoriented according to his wife. She came with him today because she did not think that he would actually tell me what was going on. With that being said unfortunately he did not go to the hospital he actually has been doing a little better over the past few days he been taking amoxicillin he has not taken any other medications orally at this point that are different. He has not been on any antibiotics either more recently other than the amoxicillin. Nonetheless he has not had a repeat in the past 3 days of that issue. 11-09-2021 upon evaluation today patient presents after having had quite an ordeal over the past several days. Subsequently yesterday I got a call from [facility] here in [place] concerning the patient and the fact that he was having bleeding that been going on for the past 24 hours and was not stopping. Subsequently my advice was to have the patient go to the ER for further  evaluation and treatment. It seems to me that he had a regular way part of the wound down to the point that a blood vessel and open. Apparently he was filling up trash bags wrapped around his foot with blood. In fact his lab review which I did do him as well today showed that he went from a white hemoglobin on 09-14-2021 of 13.6 down to a hemoglobin of 11.2 on 11-08-2021 this was yesterday. Subsequently this is due to the amount of blood loss that he has had acutely. Again I do not believe he is at transfusion stage but at the same time he is extremely weak and states that a couple times when he is going to stand up he is actually fallen back into his chair. Nonetheless I do believe that he probably needs to supplement with iron and I did recommend that there are some  over-the-counter products he could get in this regard. Subsequently also suggested that the patient needs to be drinking plenty of water he apparently drinks Coke and he drinks coffee but that is about it. Subsequently following this conversation his wife was present during this time as well and I do think that he is going to try to drink more I think it is of utmost importance. With that being said I did review his PCR culture as well it was positive for 2 organisms. This was Pseudomonas and Enterobacter. Both were showing up as being very prevalent in the PCR culture and subsequently Cipro will take care of the situation which is what I am recommending at this point based on what we see. This will take the place of the Bactrim and can have him discontinue the Bactrim the only thing is he is getting need to stop taking the hydroxyzine if he has been taking it that is the Vistaril and he tells me that he only takes this when he itches he has not been taking it recently. 6/7; patient arrives in clinic today with the wound on the right foot actually looking some better the area on the left lateral looking about the same. Problematically, he is  having edema fluid leakage on the medial part of the left foot and ankle causing skin breakdown but no open wound. He has been very resistant to the notion of compression wraps. He has been using Santyl on both wound areas at home changing the dressing himself Partway through our visit today it became clear he had had a fall on Sunday 3 days ago. Since then he has had a lot of problems with his right shoulder 11-23-2021 upon evaluation today patient appears to be doing well with regard to his wound. Has been tolerating dressing changes without complication. Fortunately there does not appear to be any signs of infection he was wrapped last week when Dr. Dellia Nims saw him on the left. He was having some breakdown medially which they were concerned about. He did keep the wrap on until Monday but states it got extremely wet. For that reason I am thinking of doing a nurse visit on Friday so that we can get some of the swelling down right now by Friday we should have a lot of that out that we can switch out and put on a fresh dressing to get him through till next Wednesday. He is in agreement with giving that a try. 11-30-2021 upon evaluation today patient actually appears to be doing better with regard to the overall appearance of his wound. Fortunately there does not appear to be any signs of active infection locally or systemically which is great news. With that being said he has been tolerating the dressing changes without complication other than the fact that from Wednesday to Friday he did well with the compression wrap from Friday till yesterday he tells me the drainage was so bad and the smell was so bad he had to take this off. It does appear that he is very macerated around the lower portion down around the heel and I do think that this is an issue. Unfortunately we cannot get home health to help with this currently. 12-07-2021 upon evaluation today patient appears to be doing well currently in regard to  his wound. He is actually showing signs of excellent improvement which is great news. I am very pleased with where things stand. I do think that the progress is being made. With that  being said the patient is tolerating the Santyl quite well and I think is doing an awesome job for him. 12-14-2021 upon evaluation today patient appears to be doing well with regard to his wounds he is making progress is still draining a lot however which I completely understand. With that being said fortunately there does not appear to be any signs of active infection locally or systemically at this time. 12-21-2021 upon evaluation today patient appears to be doing well currently in regard to his wound. He is actually showing signs of significant improvement this is slow but nonetheless week by week we are seeing this improve on the lateral aspect of his foot. The measurements are little bit better but more importantly the wound surface is actually a whole lot cleaner. The patient is pleased to hear this. With that being said he tells me that it is really hard for him to see exactly what all is going on. Nonetheless I am very pleased with all the new red tissue that were seeing granulating and hopefully we get this clean shortly and we can even switch to a different dressing to try to get it to close. 12-28-2021 upon evaluation today patient appears to be doing well currently in regard to his wound is actually showing signs of improvement we are going to perform some debridement today to clearway some of the necrotic debris. Overall I think that we are at the point where we do not have to use Santyl over the home wound I think using it just on the upper portion may be appropriate if we can get this completely cleaned away. 7/6; substantial wound on the left lateral foot. Using Santyl and Hydrofera Blue there is been good improvement since the last time I saw this. The patient does not tolerate compression wraps therefore he  is using Ace wraps. Fortunately he appears to be doing this fairly well 01-11-22 upon evaluation today patient appears to be doing better in regard to his wound in general though he is having some issues here with blue-green drainage. I am going to go ahead and see about getting him sent in a prescription for gentamicin cream. 01-18-2022 upon evaluation today patient actually appears to be doing quite well in regards to his wound there green drainage we will previously done with has improved. Fortunately I do not see any evidence of active infection locally or systemically which is great news. No fever chills noted 02-01-2022 upon evaluation today patient appears to be doing well with regard to his wound. Has been tolerating the dressing changes without complication. Fortunately I see no signs of active infection locally or systemically at this time which is great news. 02-15-2022 upon evaluation today patient appears to be doing excellent in regard to his wound he is actually showing signs of good improvement. This wound is looking much better and in fact were getting close to not even needing the Santyl any longer. Overall though I think is still good to be helpful with the more proximal portion of the wound. 03-01-2022 upon evaluation today patient appears to be doing well with regard to his ulcer although there is some irritation in the skin around. I do believe this is in a large part due to drainage and I discussed that with him today. I do think that we need to try to see what we can do to try to prevent this from being an ongoing issue going forward. Electronic Signature(s) Signed: 03/01/2022 6:13:48 PM By: [name]  PA-C Entered By: [name] on 03/01/2022 18:13:48 -------------------------------------------------------------------------------- Physical Exam Details Patient Name: Date of Service: [place], [place] LD C. 03/01/2022 9:30 A M Medical Record Number: [id number] Patient Account  Number: [id number] Date of Birth/Sex: Treating RN: 12/02/1940 (81 y.o. M) Primary Care Provider: [name] Other Clinician: Referring Provider: Treating Provider/Extender: Azucena Kuba in Treatment: 59 Constitutional Well-nourished and well-hydrated in no acute distress. Respiratory normal breathing without difficulty. Psychiatric this patient is able to make decisions and demonstrates good insight into disease process. Alert and Oriented x 3. pleasant and cooperative. Notes Upon inspection patient's wound did require sharp debridement of clearway some of the necrotic debris he tolerated that today without complication and postdebridement wound bed appears to be doing significantly better which is also news. Electronic Signature(s) Signed: 03/01/2022 6:14:01 PM By: [name] PA-C Entered By: [name] on 03/01/2022 18:14:00 -------------------------------------------------------------------------------- Physician Orders Details Patient Name: Date of Service: [place], [name] 03/01/2022 9:30 A M Medical Record Number: [id number] Patient Account Number: [id number] Date of Birth/Sex: Treating RN: 08-29-1940 (81 y.o. 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 - 03/15/22  @ 9:30am with Bernette Redbird, Room 7) Return A Other: - [employer]:Supplies Cellular or Tissue Based Products Cellular or Tissue Based Product Type: - Will run IVR for Apligraf= $295 [employer] Bathing/ Shower/ Hygiene May shower and wash wound with soap and water. - wash with antibacterial soap when changing dressing Edema Control - Lymphedema / SCD / Other Elevate legs to the level of the heart or above for 30 minutes daily and/or when sitting, a frequency of: - throughout the day Patient to wear own compression stockings every day. - Use stocking daily to right leg Moisturize legs daily. - Eucerin (in jar) Additional Orders / Instructions Follow Nutritious Diet Wound Treatment Wound #6 - Foot Wound Laterality: Left, Lateral Cleanser: Soap and Water Every Other Day/30 Days Discharge Instructions: May shower and wash wound with dial antibacterial soap and water prior to dressing change. Peri-Wound Care: Zinc Oxide Ointment 30g tube Every Other Day/30 Days Discharge Instructions: Apply Zinc Oxide to periwound with each dressing change Prim Dressing: Hydrofera Blue Ready Foam, 4x5 in Every Other Day/30 Days ary Discharge Instructions: Apply to wound bed as instructed Secondary Dressing: ABD Pad, 5x9 (Generic) Every Other Day/30 Days Discharge Instructions: Apply over primary dressing as directed. Secured With: Coban Self-Adherent Wrap 4x5 (in/yd) Every Other Day/30 Days Discharge Instructions: Secure with Coban as directed. Secured With: Elastic Bandage 4 inch (ACE bandage) (Generic) Every Other Day/30 Days Discharge Instructions: Secure with ACE bandage as directed. Secured With: [employer], 4.5x3.1 (in/yd) (Generic) Every Other Day/30 Days Discharge Instructions: Secure with Kerlix as directed. Secured With: 50M Medipore Public affairs consultant Surgical T 2x10 (in/yd) (Generic) Every Other Day/30 Days ape Discharge Instructions: Secure with tape as directed. Patient Medications llergies: latex,  Feraheme, Naprosyn, levothyroxine A Notifications Medication Indication  Start End prior to debridement 03/01/2022 lidocaine DOSE topical 5 % ointment - ointment topical Electronic Signature(s) Signed: 03/03/2022 12:28:44 PM By: Sharyn Creamer RN, BSN Signed: 03/03/2022 1:37:17 PM By: [name] PA-C Previous Signature: 03/01/2022 5:51:27 PM Version By: [name] PA-C Previous Signature: 03/03/2022 10:29:43 AM Version By: Sharyn Creamer RN, BSN Entered By: Sharyn Creamer on 03/03/2022 11:44:38 -------------------------------------------------------------------------------- Problem List Details Patient Name: Date of Service: [place], [name] 03/01/2022 9:30 A M Medical Record Number: [id number] Patient Account Number: [id number] Date of Birth/Sex: Treating RN: April 01, 1941 (81 y.o. 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 10/19/2021 No Yes L97.522 Non-pressure chronic ulcer of other part of left foot with fat layer exposed 10/19/2021 No Yes L97.512 Non-pressure chronic ulcer of other part of right foot with fat layer exposed 10/19/2021 No Yes E11.622 Type 2 diabetes mellitus with other skin ulcer 10/19/2021 No Yes Inactive Problems Resolved Problems Electronic Signature(s) Signed: 03/01/2022 9:45:44 AM By: [name] PA-C Entered By: [name] on 03/01/2022 09:45:44 -------------------------------------------------------------------------------- Progress Note Details Patient Name: Date of Service: [place], [name] 03/01/2022 9:30 A  M Medical Record Number: [id number] Patient Account Number: [id number] Date of Birth/Sex: Treating RN: 10-Jan-1941 (81 y.o. M) Primary Care Provider: [name] Other Clinician: Referring Provider: Treating Provider/Extender: Azucena Kuba in Treatment: 19 Subjective Chief Complaint Information obtained from Patient Bilateral foot ulcers History of Present Illness (HPI) 81 year old gentleman known to have swelling of his left lower extremity with some ulceration along the medial ankle has been having these problems for the last 3-4 months and does not know how it came on. No history of injury or no history of any infection in this area. He is known to have diabetes mellitus controlled with diet, hyperlipidemia, hypertension and chronic lumbar back pain with sciatica which is being treated by his PCP. Last hemoglobin A1c was 6.3 in June 2017. Last medical history is significant for esophageal stricture, hiatal hernia, vitamin D deficiency, status post back surgery o3, hernia repair as a child for both groins, hiatal hernia repair, joint replacement and revision of a knee surgery on the right side. He has quit smoking in 1982. He has never had a Doppler study of his lower extremity except remotely when he had knee surgery they looked for a blood clot on the right side. 09/13/2016 -- venous reflux study done on 09/12/2016 showed there is evidence of greater saphenous vein reflux in the left lower extremity and the small saphenous vein and the posterior thigh is not competent. 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 With these results he definitely needs a referral to the vascular surgeons 09/20/2016 -- he has an appointment to see the vascular surgeons on May 1 10/03/2016 -- patient had a 4 layer compression on his left lower extremity and had a lot of discomfort and pain. 10/11/2016-- was seen by Dr. Althea [name] -- review of his data he  recommended staged laser ablation of his great and then small saphenous veins for reduction odd of his venous hypertension. He did examine his right leg with the SonoSite and  he has dilatation of the saphenous vein on the right lower extremity too. Since there was no evidence of ulceration he recommended observation of the right lower extremity. He recommended to continue with local wound care at the wound center until his wound was completely healed. 10/18/2016 -- the patient has not been very compliant with his compression, his diet and his diuretics and as a result his lymphedema has increased markedly 10/25/2016 -- he has been compliant this week, has worn his 2 press compression wraps, taken his diuretics and is watching his salt intake. 11/08/2016 -- he had his venous procedure done last week and details of this have been reported and reviewed in his electronic medical record. he had a laser ablation of his great saphenous vein and this was done from mid calf to just below the saphenofemoral junction. He would return next week for ultrasound follow-up. He will then undergo the small saphenous vein ablation in a few weeks. 11/15/2016 -- his postprocedure visit showed good closure of his left great saphenous vein from the mid calf to 1-1/2 cm from the saphenofemoral junction. He had excellent early results from the ablation. The ablation of the left small saphenous vein was planned in a week 12/06/2016 -- he had his small saphenous vein venous ablation a week before and the duplex showed closure of his small saphenous vein to within half centimeter office saphenous popliteal junction with no DVT and he was pleased with the results. He recommended continue with elevation and compression and to see them back on an as-needed basis 12/19/16; the patient's wound is actually closed. Sometime over the weekend he developed a small abrasion on the lower calf just above the original wound on the left medial  malleolus although this is closed as well Readmission: 01/18/18 on evaluation today patient presents after having last been seen in our clinic July 2018. Subsequently he is a reoccurrence of the ulcers on the left ankle both medial and now a new area lateral that had been present back room for about a month he tells me. He states that this has done very well for about a year he really does not know of any injury or anything that happened that would have caused this reopening at this point. He has continued to wear compression stockings which is appropriate. He does not have lymphedema pumps. He has continued to also keep the area clean and dry as best he could. With that being said now that is been reopened is been harder for him to take care of this as far as compression stockings are concerned keeping them clean along with continuing to manage his fluid and swelling. His ABI appears to be great on the left registering at 1.14 today. He has previously undergone a venous ablation. Other than compression/lymphedema pumps he's really done everything that he can to help manage and prevent these ulcers from forming. 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 01/30/18 on evaluation today patient appears to actually be doing very well in regard to his medial ankle ulcer. It's the lateral ulcer that actually is not doing quite as well today. Fortunately he did have some alginate left ovary start using this on the wounds and the medial ankle actually seems to be doing much better. The lateral ankle does actually need something to con a help with slough and buildup. The collagen really did not seem to do much for him in that regard. 02/13/18 on evaluation today patient appears to be doing about the same  in regard to his ulcers. Unfortunately I do not feel like were making good progress at this time. When I have debrided the wound the slough seems to come back fairly quickly in the lateral ankle ulcer. There does not appear to  be any evidence of infection at this time. No fevers, chills, nausea, or vomiting noted at this time. 02/27/18 on evaluation today patient actually appears to be showing some signs of improvement in regard to both wound areas. I'm insurance on the medial aspect of his left lower extremity at the ankle whether or not the alginate may be sticking and causing some tearing off of new skin attempting to grow. With that being said the patient states it does not seem to be doing such just pulls up some of the dried dead skin around. Nonetheless this is something I wanted to watch out for going forward and actually I recommended that he take the dressing off in the shower when you could get it completely wet before removal to see how this does. Subsequently in regard to the lateral ankle ulcer he still has slough noted at this point although I do believe that he is tolerating the Medihoney much better I wish you could have gotten the Santyl but unfortunately the Santyl was too expensive gonna cost him $250. 03/06/18 on evaluation today patient appears to be doing about the same in regard to both ulcers of his left medial and lateral malleolus sites. He's been tolerating the dressing changes without complication. Unfortunately things do not seem to be improving in regard to either side at this time. Overall I think we may need to switch things up a little bit as far as the way we are performing the dressing changes currently. 03/27/18 on evaluation today patient's wound bed actually appears to be doing much better at both locations in regard to his ankle ulcers. He has been tolerating the dressing changes at this time without complication. Overall I'm very pleased with how things appear. The patient likewise states that he's much better in regard to discomfort at this time. 04/10/18 on evaluation today patient actually appears to be doing well in regard to his left lateral ankle ulcer. He has been tolerating the  dressing changes without complication. Fortunately there does not appear to be any evidence of infection. Overall I do feel like he is tolerating the Medihoney without complication. 04/24/18 upon evaluation today patient's wound actually appears to be healed on the lateral aspect of his ankle the medial aspect still continues to remain close without any evidence of complication at this time. Overall I have been extremely pleased with how things have progressed up to this point. The patient likewise is very happy he's not having any significant pain this time. He does wears compression stocking on a regular basis. Readmission: 10-19-2021 upon evaluation today patient presents for evaluation here in the clinic concerning issues with a left lateral ulcer as well as a right lateral ulcer both along the aspect of his foot getting close to the ankle. This left area is the same place that I treated back in 2024. 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 This appears to be very inflamed and is also very painful. In the past he has been completely unable to tolerate the use of any compression wraps unfortunately. There does not appear to be any evidence of active infection locally nor systemically at this time which is good news. I  do believe however we need to try to see what we can do to calm down the inflammation I think triamcinolone topically as well as oral prednisone would likely be indicated in this case. Patient's medical history really has not changed significantly since I last saw him in actually 2019 though I think is stated 2020 above. 10-26-21 upon evaluation today patient appears to still be having a lot of irritation and inflammation in regard to the ankles laterally. The left is greater than the right. With that being said I do believe that he would benefit from a biopsy to confirm whether or not this may indeed be vasculitis. It actually seems like  that physically and on examination but again I want to be sure that we are on the right track as far as treatment is concerned. The good news is he did not have a DVT I called him about that on Friday this was all in regard to the right leg. Overall I am very pleased in that regard. 11-02-2021 upon evaluation today patient appears to be doing poorly still in regard to his left lower extremity. Apparently he had a fever on Sunday which was around 101 and he was severely sick feeling and disoriented according to his wife. She came with him today because she did not think that he would actually tell me what was going on. With that being said unfortunately he did not go to the hospital he actually has been doing a little better over the past few days he been taking amoxicillin he has not taken any other medications orally at this point that are different. He has not been on any antibiotics either more recently other than the amoxicillin. Nonetheless he has not had a repeat in the past 3 days of that issue. 11-09-2021 upon evaluation today patient presents after having had quite an ordeal over the past several days. Subsequently yesterday I got a call from [facility] here in [place] concerning the patient and the fact that he was having bleeding that been going on for the past 24 hours and was not stopping. Subsequently my advice was to have the patient go to the ER for further evaluation and treatment. It seems to me that he had a regular way part of the wound down to the point that a blood vessel and open. Apparently he was filling up trash bags wrapped around his foot with blood. In fact his lab review which I did do him as well today showed that he went from a white hemoglobin on 09-14-2021 of 13.6 down to a hemoglobin of 11.2 on 11-08-2021 this was yesterday. Subsequently this is due to the amount of blood loss that he has had acutely. Again I do not believe he is at transfusion stage but at the same time he  is extremely weak and states that a couple times when he is going to stand up he is actually fallen back into his chair. Nonetheless I do believe that he probably needs to supplement with iron and I did recommend that there are some over-the-counter products he could get in this regard. Subsequently also suggested that the patient needs to be drinking plenty of water he apparently drinks Coke and he drinks coffee but that is about it. Subsequently following this conversation his wife was present during this time as well and I do think that he is going to try to drink more I think it is of utmost importance. With that being said I did review his  PCR culture as well it was positive for 2 organisms. This was Pseudomonas and Enterobacter. Both were showing up as being very prevalent in the PCR culture and subsequently Cipro will take care of the situation which is what I am recommending at this point based on what we see. This will take the place of the Bactrim and can have him discontinue the Bactrim the only thing is he is getting need to stop taking the hydroxyzine if he has been taking it that is the Vistaril and he tells me that he only takes this when he itches he has not been taking it recently. 6/7; patient arrives in clinic today with the wound on the right foot actually looking some better the area on the left lateral looking about the same. Problematically, he is having edema fluid leakage on the medial part of the left foot and ankle causing skin breakdown but no open wound. He has been very resistant to the notion of compression wraps. He has been using Santyl on both wound areas at home changing the dressing himself Partway through our visit today it became clear he had had a fall on Sunday 3 days ago. Since then he has had a lot of problems with his right shoulder 11-23-2021 upon evaluation today patient appears to be doing well with regard to his wound. Has been tolerating dressing changes  without complication. Fortunately there does not appear to be any signs of infection he was wrapped last week when Dr. Dellia Nims saw him on the left. He was having some breakdown medially which they were concerned about. He did keep the wrap on until Monday but states it got extremely wet. For that reason I am thinking of doing a nurse visit on Friday so that we can get some of the swelling down right now by Friday we should have a lot of that out that we can switch out and put on a fresh dressing to get him through till next Wednesday. He is in agreement with giving that a try. 11-30-2021 upon evaluation today patient actually appears to be doing better with regard to the overall appearance of his wound. Fortunately there does not appear to be any signs of active infection locally or systemically which is great news. With that being said he has been tolerating the dressing changes without complication other than the fact that from Wednesday to Friday he did well with the compression wrap from Friday till yesterday he tells me the drainage was so bad and the smell was so bad he had to take this off. It does appear that he is very macerated around the lower portion down around the heel and I do think that this is an issue. Unfortunately we cannot get home health to help with this currently. 12-07-2021 upon evaluation today patient appears to be doing well currently in regard to his wound. He is actually showing signs of excellent improvement which is great news. I am very pleased with where things stand. I do think that the progress is being made. With that being said the patient is tolerating the Santyl quite well and I think is doing an awesome job for him. 12-14-2021 upon evaluation today patient appears to be doing well with regard to his wounds he is making progress is still draining a lot however which I completely understand. With that being said fortunately there does not appear to be any signs of  active infection locally or systemically at this time. 12-21-2021 upon evaluation today  patient appears to be doing well currently in regard to his wound. He is actually showing signs of significant improvement this is slow but nonetheless week by week we are seeing this improve on the lateral aspect of his foot. The measurements are little bit better but more importantly the wound surface is actually a whole lot cleaner. The patient is pleased to hear this. With that being said he tells me that it is really hard for him to see exactly what all is going on. Nonetheless I am very pleased with all the new red tissue that were seeing granulating and hopefully we get this clean shortly and we can even switch to a different dressing to try to get it to close. 12-28-2021 upon evaluation today patient appears to be doing well currently in regard to his wound is actually showing signs of improvement we are going to perform some debridement today to clearway some of the necrotic debris. Overall I think that we are at the point where we do not have to use Santyl over the home wound I think using it just on the upper portion may be appropriate if we can get this completely cleaned away. 7/6; substantial wound on the left lateral foot. Using Santyl and Hydrofera Blue there is been good improvement since the last time I saw this. The patient does not tolerate compression wraps therefore he is using Ace wraps. Fortunately he appears to be doing this fairly well 01-11-22 upon evaluation today patient appears to be doing better in regard to his wound in general though he is having some issues here with blue-green drainage. I am going to go ahead and see about getting him sent in a prescription for gentamicin cream. 01-18-2022 upon evaluation today patient actually appears to be doing quite well in regards to his wound there green drainage we will previously done with has improved. Fortunately I do not see any evidence of  active infection locally or systemically which is great news. No fever chills noted 02-01-2022 upon evaluation today patient appears to be doing well with regard to his wound. Has been tolerating the dressing changes without complication. Fortunately I see no signs of active infection locally or systemically at this time which is great news. 02-15-2022 upon evaluation today patient appears to be doing excellent in regard to his wound he is actually showing signs of good improvement. This wound is looking much better and in fact were getting close to not even needing the Santyl any longer. Overall though I think is still good to be helpful with the more proximal portion of the wound. 03-01-2022 upon evaluation today patient appears to be doing well with regard to his ulcer although there is some irritation in the skin around. I do believe this is in a large part due to drainage and I discussed that with him today. I do think that we need to try to see what we can do to try to prevent this from being an ongoing issue going forward. Objective Constitutional Well-nourished and well-hydrated in no acute distress. Vitals Time Taken: 10:00 AM, Height: 66 in, Weight: 184 lbs, BMI: 29.7, Temperature: 98.2 F, Pulse: 74 bpm, Respiratory Rate: 18 breaths/min, Blood Pressure: 148/78 mmHg. Respiratory normal breathing without difficulty. Psychiatric this patient is able to make decisions and demonstrates good insight into disease process. Alert and Oriented x 3. pleasant and cooperative. General Notes: Upon inspection patient's wound did require sharp debridement of clearway some of the necrotic debris he tolerated that today  without complication and postdebridement wound bed appears to be doing significantly better which is also news. Integumentary (Hair, Skin) Wound #6 status is Open. Original cause of wound was Gradually Appeared. The date acquired was: 07/25/2021. The wound has been in treatment 19  weeks. The wound is located on the Left,Lateral Foot. The wound measures 5.2cm length x 3cm width x 0.2cm depth; 12.252cm^2 area and 2.45cm^3 volume. There is Fat Layer (Subcutaneous Tissue) exposed. There is no tunneling or undermining noted. There is a large amount of serosanguineous drainage noted. The wound margin is distinct with the outline attached to the wound base. There is large (67-100%) red, pink, hyper - granulation within the wound bed. There is a small (1- 33%) amount of necrotic tissue within the wound bed including Adherent Slough. 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 There was a Excisional Skin/Subcutaneous Tissue Debridement with a total area of 15.6 sq cm performed by [name], PA. With the following instrument(s): Curette to remove Non-Viable tissue/material. Material removed includes Subcutaneous Tissue, Slough, and Biofilm after achieving pain control using Lidocaine 5% topical ointment. No specimens were taken. A time out was conducted at 10:23, prior to the start of the procedure. A Minimum amount of bleeding was controlled with Pressure. The procedure was tolerated well with a pain level of 0 throughout and a pain level of 3 following the procedure. Post Debridement Measurements: 5.2cm length x 3cm width x 0.2cm depth; 2.45cm^3 volume. Character of Wound/Ulcer Post Debridement is improved. Post procedure Diagnosis Wound #6: Same as Pre-Procedure Plan Follow-up Appointments: Return  Appointment in 2 weeks. - 03/15/22 @ 9:30am with Bernette Redbird, Room 7) Other: - Prism:Supplies Cellular or Tissue Based Products: Cellular or Tissue Based Product Type: - Will run IVR for Apligraf= $295 [employer] Bathing/ Shower/ Hygiene: May shower and wash wound with soap and water. - wash with antibacterial soap when changing dressing Edema Control - Lymphedema / SCD / Other: Elevate legs to the level of the heart or above for 30 minutes daily and/or when sitting, a frequency of: - throughout the day Patient to wear own compression stockings every day. - Use stocking daily to right leg Moisturize legs daily. - Eucerin (in jar) Additional Orders / Instructions: Follow Nutritious Diet The following medication(s) was prescribed: lidocaine topical 5 % ointment ointment topical for prior to debridement was prescribed at facility WOUND #6: - Foot Wound Laterality: Left, Lateral Cleanser: Soap and Water Every Other Day/30 Days Discharge Instructions: May shower and wash wound with dial antibacterial soap and water prior to dressing change. Peri-Wound Care: Zinc Oxide Ointment 30g tube Every Other Day/30 Days Discharge Instructions: Apply Zinc Oxide to periwound with each dressing change Prim Dressing: Hydrofera Blue Ready Foam, 4x5 in Every Other Day/30 Days ary Discharge Instructions: Apply to wound bed as instructed Secondary Dressing: ABD Pad, 5x9 (Generic) Every Other Day/30 Days Discharge Instructions: Apply over primary dressing as directed. Secured With: Elastic Bandage 4 inch (ACE bandage) (Generic) Every Other Day/30 Days Discharge Instructions: Secure with ACE bandage as directed. Secured With: [employer], 4.5x3.1 (in/yd) (Generic) Every  Other Day/30 Days Discharge Instructions: Secure with Kerlix as directed. Secured With: 35M Medipore Public affairs consultant Surgical T 2x10 (in/yd) (Generic) Every Other Day/30 Days ape Discharge Instructions: Secure with tape as directed. 1. I am going to  suggest that we go ahead and continue with the recommendation for wound care measures as before and the patient is in agreement with the plan. This includes the use of the [facility] with zinc oxide around the edges of the wound. 2. We will get a discontinue the Santyl as well as the triamcinolone. 3. We Argun to use an ABD pad to cover and roll gauze to secure in place. We will see patient back for reevaluation in 2 weeks here in the clinic. If anything worsens or changes patient will contact our office for additional recommendations. Electronic Signature(s) Signed: 03/01/2022 6:14:32 PM By: [name] PA-C Entered By: [name] on 03/01/2022 18:14:32 -------------------------------------------------------------------------------- SuperBill Details Patient Name: Date of Service: [place], [name] 03/01/2022 Medical Record Number: [id number] Patient Account Number: [id number] Date of Birth/Sex: Treating RN: 1941-02-09 (81 y.o. 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

## 2022-03-03 DIAGNOSIS — L97929 Non-pressure chronic ulcer of unspecified part of left lower leg with unspecified severity: Secondary | ICD-10-CM | POA: Diagnosis not present

## 2022-03-03 NOTE — Progress Notes (Signed)
KHALEED, HOLAN ([id number]) Visit Report for 03/01/2022 Arrival Information Details Patient Name: Date of Service: [place], [name] 03/01/2022 9:30 A M Medical Record Number: [id number] Patient Account Number: [id number] Date of Birth/Sex: Treating RN: May 05, 1941 (81 y.o. Mare Ferrari Primary Care Laren Whaling: [name] Other Clinician: Referring Jawad Wiacek: Treating Valina Maes/Extender: Azucena Kuba in Treatment: 19 Visit Information History Since Last Visit Added or deleted any medications: No Patient Arrived: Ambulatory Any new allergies or adverse reactions: No Arrival Time: 10:01 Had a fall or experienced change in No Accompanied By: self activities of daily living that may affect Transfer Assistance: None risk of falls: Patient Identification Verified: Yes Signs or symptoms of abuse/neglect since last visito No Secondary Verification Process Completed: Yes Hospitalized since last visit: No Patient Requires Transmission-Based Precautions: No Implantable device outside of the clinic excluding No Patient Has Alerts: No cellular tissue based products placed in the center since last visit: Has Dressing in Place as Prescribed: Yes Pain Present Now: Yes Electronic Signature(s) Signed: 03/03/2022 10:29:43 AM By: Sharyn Creamer RN, BSN Entered By: Sharyn Creamer on 03/01/2022 10:01:54 -------------------------------------------------------------------------------- Encounter Discharge Information Details Patient Name: Date of Service: [place], [name] 03/01/2022 9:30 A M Medical Record Number: [id number] Patient Account Number: [id number] Date of Birth/Sex: Treating RN: [birth year] (81 y.o. Mare Ferrari Primary Care Christne Platts: [name] Other Clinician: Referring Shelle Galdamez: Treating Lon Klippel/Extender: Azucena Kuba in Treatment: 19 Encounter Discharge Information Items Post Procedure Vitals Discharge  Condition: Stable Temperature (F): 98.2 Ambulatory Status: Ambulatory Pulse (bpm): 74 Discharge Destination: Home Respiratory Rate (breaths/min): 18 Transportation: Private Auto Blood Pressure (mmHg): 148/78 Accompanied By: self Schedule Follow-up Appointment: Yes Clinical Summary of Care: Patient Declined Electronic Signature(s) Signed: 03/03/2022 10:29:43 AM By: Sharyn Creamer RN, BSN Entered By: Sharyn Creamer on 03/01/2022 10:34:08 -------------------------------------------------------------------------------- Lower Extremity Assessment Details Patient Name: Date of Service: [place], [name] 03/01/2022 9:30 A M Medical Record Number: [id number] Patient Account Number: [id number] Date of Birth/Sex: Treating RN: Oct 23, 1940 (81 y.o. Mare Ferrari Primary Care Demitri Kucinski: [name] Other Clinician: Referring Lola Lofaro: Treating Kmari Brian/Extender: Azucena Kuba in Treatment: 19 Edema Assessment Assessed: [Left: No] Patrice Paradise: No] Edema: [Left: Ye] [Right: s] Calf Left: Right: Point of Measurement: 32 cm From Medial Instep 38 cm Ankle Left: Right: Point of Measurement: 10 cm From Medial Instep 24 cm Vascular Assessment Pulses: Dorsalis Pedis Palpable: [Left:Yes] Electronic Signature(s) Signed: 03/03/2022 10:29:43 AM By: Sharyn Creamer RN, BSN Entered By: Sharyn Creamer on 03/01/2022 10:06:57 -------------------------------------------------------------------------------- Multi-Disciplinary Care Plan Details Patient Name: Date of Service: [place], [name] 03/01/2022 9:30 A M Medical Record Number: [id number] Patient Account Number: [id number] Date of Birth/Sex: Treating RN: January 03, 1941 (81 y.o. Mare Ferrari Primary Care Shiva Sahagian: [name] Other Clinician: Referring Thorvald Orsino: Treating Micki Cassel/Extender: Azucena Kuba in Treatment: 19 Active Inactive Venous Leg Ulcer Nursing  Diagnoses: Actual venous Insuffiency (use after diagnosis is confirmed) Goals: Patient will maintain optimal edema control Date Initiated: 10/19/2021 Target Resolution Date: 03/08/2022 Goal Status: Active Interventions: Assess peripheral edema status every visit. Compression as ordered Notes: 11/16/21: Edema not controlled, patient not compliant with compression. 12/14/21:Edema not well controlled, patient will not allow compression wraps. 02/15/22: Edema better controlled, patient using ACE wrap Wound/Skin Impairment Nursing Diagnoses: Impaired tissue integrity Goals: Patient/caregiver will verbalize understanding of skin care regimen Date Initiated: 10/19/2021 Target Resolution Date: 03/08/2022 Goal Status: Active  Ulcer/skin breakdown will have a volume reduction of 30% by week 4 Date Initiated: 10/19/2021 Date Inactivated: 12/14/2021 Target Resolution Date: 12/14/2021 Unmet Reason: infection, non Goal Status: Unmet compliance Interventions: Assess patient/caregiver ability to obtain necessary supplies Assess patient/caregiver ability to perform ulcer/skin care regimen upon admission and as needed Assess ulceration(s) every visit Provide education on ulcer and skin care Treatment Activities: Topical wound management initiated : 10/19/2021 Notes: 11/16/21: Wound care continues, patient not compliant with edema control. 02/15/22: Wound care regimen continues. Large co-pay for skin subs. Electronic Signature(s) Signed: 03/03/2022 10:29:43 AM By: Sharyn Creamer RN, BSN Entered By: Sharyn Creamer on 03/01/2022 10:10:52 -------------------------------------------------------------------------------- Pain Assessment Details Patient Name: Date of Service: [place], [name] 03/01/2022 9:30 A M Medical Record Number: [id number] Patient Account Number: [id number] Date of Birth/Sex: Treating RN: May 16, 1941 (81 y.o. Mare Ferrari Primary Care Pardeep Pautz: [name] Other  Clinician: Referring Yazmin Locher: Treating Saraia Platner/Extender: Azucena Kuba in Treatment: 19 Active Problems Location of Pain Severity and Description of Pain Patient Has Paino Yes Site Locations Rate the pain. Current Pain Level: 4 Pain Management and Medication Current Pain Management: Electronic Signature(s) Signed: 03/03/2022 10:29:43 AM By: Sharyn Creamer RN, BSN Entered By: Sharyn Creamer on 03/01/2022 10:02:46 -------------------------------------------------------------------------------- Patient/Caregiver Education Details Patient Name: Date of Service: [place] [name] 9/20/2023andnbsp9:30 A M Medical Record Number: [id number] Patient Account Number: [id number] Date of Birth/Gender: Treating RN: 10/11/1940 (81 y.o. Mare Ferrari Primary Care Physician: [name] Other Clinician: Referring Physician: Treating Physician/Extender: Azucena Kuba in Treatment: 19 Education Assessment Education Provided To: Patient Education Topics Provided Wound/Skin Impairment: Methods: Explain/Verbal Responses: State content correctly [employer]) Signed: 03/03/2022 10:29:43 AM By: Sharyn Creamer RN, BSN Entered By: Sharyn Creamer on 03/01/2022 10:11:12 -------------------------------------------------------------------------------- Wound Assessment Details Patient Name: Date of Service: [place], [name] 03/01/2022 9:30 A M Medical Record Number: [id number] Patient Account Number: [id number] Date of Birth/Sex: Treating RN: Jun 12, 1941 (81 y.o. Mare Ferrari Primary Care Legaci Tarman: [name] Other Clinician: Referring Shelanda Duvall: Treating Lyrah Bradt/Extender: Azucena Kuba in Treatment: 19 Wound Status Wound Number: 6 Primary Vasculitis Etiology: Wound Location: Left, Lateral Foot Wound Open Wounding Event: Gradually Appeared Status: Date Acquired:  07/25/2021 Comorbid Anemia, Chronic Obstructive Pulmonary Disease (COPD), Weeks Of Treatment: 19 History: Hypertension, Peripheral Venous Disease, Type II Diabetes, Clustered Wound: No Osteoarthritis, Neuropathy Photos Wound Measurements Length: (cm) 5.2 Width: (cm) 3 Depth: (cm) 0.2 Area: (cm) 12.252 Volume: (cm) 2.45 % Reduction in Area: 37.1% % Reduction in Volume: 37.1% Epithelialization: Medium (34-66%) Tunneling: No Undermining: No Wound Description Classification: Full Thickness Without Exposed Support Structures Wound Margin: Distinct, outline attached Exudate Amount: Large Exudate Type: Serosanguineous Exudate Color: red, brown Foul Odor After Cleansing: No Slough/Fibrino Yes Wound Bed Granulation Amount: Large (67-100%) Exposed Structure Granulation Quality: Red, Pink, Hyper-granulation Fascia Exposed: No Necrotic Amount: Small (1-33%) Fat Layer (Subcutaneous Tissue) Exposed: Yes Necrotic Quality: Adherent Slough Tendon Exposed: No Muscle Exposed: No Joint Exposed: No Bone Exposed: No Treatment Notes Wound #6 (Foot) Wound Laterality: Left, Lateral Cleanser Soap and Water Discharge Instruction: May shower and wash wound with dial antibacterial soap and water prior to dressing change. Peri-Wound Care Zinc Oxide Ointment 30g tube Discharge Instruction: Apply Zinc Oxide to periwound with each dressing change Topical Primary Dressing Hydrofera Blue Ready Foam, 4x5 in Discharge Instruction: Apply to wound bed as instructed Secondary Dressing ABD Pad, 5x9 Discharge Instruction: Apply over primary  dressing as directed. Secured With Elastic Bandage 4 inch (ACE bandage) Discharge Instruction: Secure with ACE bandage as directed. Kerlix Roll Sterile, 4.5x3.1 (in/yd) Discharge Instruction: Secure with Kerlix as directed. 21M Medipore Soft Cloth Surgical T 2x10 (in/yd) ape Discharge Instruction: Secure with tape as directed. Compression Wrap Compression  Stockings Add-Ons Electronic Signature(s) Signed: 03/01/2022 4:44:21 PM By: Erenest Blank Signed: 03/03/2022 10:29:43 AM By: Sharyn Creamer RN, BSN Entered By: Erenest Blank on 03/01/2022 10:12:14 -------------------------------------------------------------------------------- Vitals Details Patient Name: Date of Service: [name], [name] 03/01/2022 9:30 A M Medical Record Number: [id number] Patient Account Number: [id number] Date of Birth/Sex: Treating RN: 1940-09-26 (81 y.o. Mare Ferrari Primary Care Madia Carvell: [name] Other Clinician: Referring Lorali Khamis: Treating Aerionna Moravek/Extender: Azucena Kuba in Treatment: 19 Vital Signs Time Taken: 10:00 Temperature (F): 98.2 Height (in): 66 Pulse (bpm): 74 Weight (lbs): 184 Respiratory Rate (breaths/min): 18 Body Mass Index (BMI): 29.7 Blood Pressure (mmHg): 148/78 Reference Range: 80 - 120 mg / dl Electronic Signature(s) Signed: 03/03/2022 10:29:43 AM By: Sharyn Creamer RN, BSN Entered By: Sharyn Creamer on 03/01/2022 10:02:33

## 2022-03-15 ENCOUNTER — Encounter (HOSPITAL_BASED_OUTPATIENT_CLINIC_OR_DEPARTMENT_OTHER): Payer: Medicare HMO | Attending: Physician Assistant | Admitting: Physician Assistant

## 2022-03-15 DIAGNOSIS — I776 Arteritis, unspecified: Secondary | ICD-10-CM | POA: Diagnosis not present

## 2022-03-15 DIAGNOSIS — E11621 Type 2 diabetes mellitus with foot ulcer: Secondary | ICD-10-CM | POA: Diagnosis not present

## 2022-03-15 DIAGNOSIS — L97512 Non-pressure chronic ulcer of other part of right foot with fat layer exposed: Secondary | ICD-10-CM | POA: Diagnosis not present

## 2022-03-15 DIAGNOSIS — G8929 Other chronic pain: Secondary | ICD-10-CM | POA: Insufficient documentation

## 2022-03-15 DIAGNOSIS — L97522 Non-pressure chronic ulcer of other part of left foot with fat layer exposed: Secondary | ICD-10-CM | POA: Diagnosis not present

## 2022-03-15 DIAGNOSIS — E11622 Type 2 diabetes mellitus with other skin ulcer: Secondary | ICD-10-CM | POA: Insufficient documentation

## 2022-03-15 DIAGNOSIS — I87333 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 extremity: Secondary | ICD-10-CM | POA: Diagnosis not present

## 2022-03-15 DIAGNOSIS — E1151 Type 2 diabetes mellitus with diabetic peripheral angiopathy without gangrene: Secondary | ICD-10-CM | POA: Diagnosis not present

## 2022-03-15 DIAGNOSIS — I890 Lymphedema, not elsewhere classified: Secondary | ICD-10-CM | POA: Insufficient documentation

## 2022-03-15 NOTE — Progress Notes (Signed)
VIVIAN, OKELLEY ([id number]) Visit Report for 03/15/2022 Arrival Information Details Patient Name: Date of Service: [place], [name] 03/15/2022 9:30 A M Medical Record Number: [id number] Patient Account Number: [id number] Date of Birth/Sex: Treating RN: 1940/10/24 (81 y.o. [name] Primary Care Johanan Skorupski: [name] Other Clinician: Referring Wlliam Grosso: Treating Lavarius Doughten/Extender: Azucena Kuba in Treatment: 21 Visit Information History Since Last Visit Added or deleted any medications: No Patient Arrived: Ambulatory Any new allergies or adverse reactions: No Arrival Time: 09:33 Had a fall or experienced change in No Accompanied By: wife activities of daily living that may affect Transfer Assistance: None risk of falls: Patient Identification Verified: Yes Signs or symptoms of abuse/neglect since last visito No Secondary Verification Process Completed: Yes Hospitalized since last visit: No Patient Requires Transmission-Based Precautions: No Implantable device outside of the clinic excluding No Patient Has Alerts: No cellular tissue based products placed in the center since last visit: Has Dressing in Place as Prescribed: Yes Has Compression in Place as Prescribed: Yes Pain Present Now: Yes Electronic Signature(s) Signed: 03/15/2022 4:24:27 PM By: [name] Entered By: [name] on 03/15/2022 09:47:46 -------------------------------------------------------------------------------- Clinic Level of Care Assessment Details Patient Name: Date of Service: [place], [name] 03/15/2022 9:30 A M Medical Record Number: [id number] Patient Account Number: [id number] Date of Birth/Sex: Treating RN: [birth year] (81 y.o. [name] Primary Care Porschia Willbanks: [name] Other Clinician: Referring Anjelita Sheahan: Treating Avrielle Fry/Extender: Azucena Kuba in Treatment: 21 Clinic Level of Care Assessment  Items TOOL 4 Quantity Score X- 1 0 Use when only an EandM is performed on FOLLOW-UP visit ASSESSMENTS - Nursing Assessment / Reassessment X- 1 10 Reassessment of Co-morbidities (includes updates in patient status) X- 1 5 Reassessment of Adherence to Treatment Plan ASSESSMENTS - Wound and Skin A ssessment / Reassessment X - Simple Wound Assessment / Reassessment - one wound 1 5 []  - 0 Complex Wound Assessment / Reassessment - multiple wounds []  - 0 Dermatologic / Skin Assessment (not related to wound area) ASSESSMENTS - Focused Assessment []  - 0 Circumferential Edema Measurements - multi extremities []  - 0 Nutritional Assessment / Counseling / Intervention []  - 0 Lower Extremity Assessment (monofilament, tuning fork, pulses) []  - 0 Peripheral Arterial Disease Assessment (using hand held doppler) ASSESSMENTS - Ostomy and/or Continence Assessment and Care []  - 0 Incontinence Assessment and Management []  - 0 Ostomy Care Assessment and Management (repouching, etc.) PROCESS - Coordination of Care []  - 0 Simple Patient / Family Education for ongoing care X- 1 20 Complex (extensive) Patient / Family Education for ongoing care X- 1 10 Staff obtains Programmer, systems, Records, T Results / Process Orders est []  - 0 Staff telephones HHA, Nursing Homes / Clarify orders / etc []  - 0 Routine Transfer to another Facility (non-emergent condition) []  - 0 Routine Hospital Admission (non-emergent condition) []  - 0 New Admissions / Biomedical engineer / Ordering NPWT Apligraf, etc. , []  - 0 Emergency Hospital Admission (emergent condition) []  - 0 Simple Discharge Coordination []  - 0 Complex (extensive) Discharge Coordination PROCESS - Special Needs []  - 0 Pediatric / Minor Patient Management []  - 0 Isolation Patient Management []  - 0 Hearing / Language / Visual special needs []  - 0 Assessment of Community assistance (transportation, D/C planning, etc.) []  - 0 Additional  assistance / Altered mentation []  - 0 Support Surface(s) Assessment (bed, cushion, seat, etc.) INTERVENTIONS - Wound Cleansing / Measurement X - Simple Wound Cleansing -  one wound 1 5 []  - 0 Complex Wound Cleansing - multiple wounds X- 1 5 Wound Imaging (photographs - any number of wounds) []  - 0 Wound Tracing (instead of photographs) X- 1 5 Simple Wound Measurement - one wound []  - 0 Complex Wound Measurement - multiple wounds INTERVENTIONS - Wound Dressings []  - 0 Small Wound Dressing one or multiple wounds X- 1 15 Medium Wound Dressing one or multiple wounds []  - 0 Large Wound Dressing one or multiple wounds []  - 0 Application of Medications - topical []  - 0 Application of Medications - injection INTERVENTIONS - Miscellaneous []  - 0 External ear exam X- 1 5 Specimen Collection (cultures, biopsies, blood, body fluids, etc.) X- 1 5 Specimen(s) / Culture(s) sent or taken to Lab for analysis []  - 0 Patient Transfer (multiple staff / Civil Service fast streamer / Similar devices) []  - 0 Simple Staple / Suture removal (25 or less) []  - 0 Complex Staple / Suture removal (26 or more) []  - 0 Hypo / Hyperglycemic Management (close monitor of Blood Glucose) []  - 0 Ankle / Brachial Index (ABI) - do not check if billed separately X- 1 5 Vital Signs Has the patient been seen at the hospital within the last three years: Yes Total Score: 95 Level Of Care: New/Established - Level 3 Electronic Signature(s) Signed: 03/15/2022 4:24:27 PM By: [name] Entered By: [name] on 03/15/2022 10:19:57 -------------------------------------------------------------------------------- Encounter Discharge Information Details Patient Name: Date of Service: [name] 03/15/2022 9:30 A M Medical Record Number: [id number] Patient Account Number: [id number] Date of Birth/Sex: Treating RN: 14-Dec-1940 (81 y.o. [name] Primary Care Arrie Zuercher: [name] Other  Clinician: Referring Kimbella Heisler: Treating Marlene Beidler/Extender: Azucena Kuba in Treatment: 21 Encounter Discharge Information Items Discharge Condition: Stable Ambulatory Status: Ambulatory Discharge Destination: Home Transportation: Private Auto Accompanied By: wife Schedule Follow-up Appointment: Yes Clinical Summary of Care: Provided on 03/15/2022 Form Type Recipient Paper Patient Patient Electronic Signature(s) Signed: 03/15/2022 4:24:27 PM By: [name] Entered By: [name] on 03/15/2022 10:33:31 -------------------------------------------------------------------------------- Lower Extremity Assessment Details Patient Name: Date of Service: [place], [name] 03/15/2022 9:30 A M Medical Record Number: [id number] Patient Account Number: [id number] Date of Birth/Sex: Treating RN: 05-06-1941 (81 y.o. [name] Primary Care Daiwik Buffalo: [name] Other Clinician: Referring Hannah Crill: Treating Gracyn Santillanes/Extender: Azucena Kuba in Treatment: 21 Edema Assessment Assessed: [Left: Yes] Patrice Paradise: No] Edema: [Left: Ye] [Right: s] Calf Left: Right: Point of Measurement: 32 cm From Medial Instep 37.7 cm Ankle Left: Right: Point of Measurement: 10 cm From Medial Instep 22.8 cm Vascular Assessment Pulses: Dorsalis Pedis Palpable: [Left:Yes] Electronic Signature(s) Signed: 03/15/2022 4:24:27 PM By: [name] Entered By: [name] on 03/15/2022 09:43:11 -------------------------------------------------------------------------------- Multi-Disciplinary Care Plan Details Patient Name: Date of Service: [place], [name] 03/15/2022 9:30 A M Medical Record Number: [id number] Patient Account Number: [id number] Date of Birth/Sex: Treating RN: 03-20-1941 (81 y.o. 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 Compression as ordered Notes: 11/16/21: Edema not controlled, patient not compliant with compression. 12/14/21:Edema not well controlled, patient will not allow compression wraps. 02/15/22: Edema better  controlled, patient using ACE wrap Wound/Skin Impairment Nursing Diagnoses: Impaired tissue integrity Goals: Patient/caregiver will verbalize understanding of skin care regimen Date Initiated: 10/19/2021 Target Resolution Date: 04/05/2022 Goal Status: Active Ulcer/skin breakdown will have a volume reduction of 30% by week 4 Date Initiated: 10/19/2021 Date Inactivated: 12/14/2021 Target Resolution Date: 12/14/2021 Unmet Reason: infection, non Goal Status: Unmet compliance Interventions: Assess patient/caregiver ability to obtain necessary supplies Assess patient/caregiver ability to perform ulcer/skin care regimen upon admission and as needed Assess ulceration(s) every visit Provide education on ulcer and skin care Treatment Activities: Topical wound management initiated : 10/19/2021 Notes: 11/16/21: Wound care continues, patient not compliant with edema control. 02/15/22: Wound care regimen continues. Large co-pay for skin subs. Electronic Signature(s) Signed: 03/15/2022 4:24:27 PM By: [name] Entered By: [name] on 03/15/2022 09:33:28 -------------------------------------------------------------------------------- Pain Assessment Details Patient Name: Date of Service: [name], [name] 03/15/2022 9:30 A M Medical Record Number: [id number] Patient Account Number: [id number] Date of Birth/Sex: Treating RN: [birth year] (81 y.o.  [name] Primary Care Maicy Filip: [name] Other Clinician: Referring Kalecia Hartney: Treating Braniya Farrugia/Extender: Azucena Kuba in Treatment: 21 Active Problems Location of Pain Severity and Description of Pain Patient Has Paino Yes Site Locations Pain Location: Generalized Pain Rate the pain. Current Pain Level: 7 Character of Pain Describe the Pain: Burning, Tender, Throbbing Pain Management and Medication Current Pain Management: Medication: Yes Cold Application: No Rest: Yes Massage: No Activity: No T.E.N.S.: No Heat Application: No Leg drop or elevation: No Is the Current Pain Management Adequate: Adequate How does your wound impact your activities of daily livingo Sleep: No Bathing: No Appetite: No Relationship With Others: No Bladder Continence: No Emotions: No Bowel Continence: No Work: No Toileting: No Drive: No Dressing: No Hobbies: No Electronic Signature(s) Signed: 03/15/2022 4:24:27 PM By: [name] Entered By: [name] on 03/15/2022 09:48:20 -------------------------------------------------------------------------------- Patient/Caregiver Education Details Patient Name: Date of Service: [place] [name] 10/4/2023andnbsp9:30 A M Medical Record Number: [id number] Patient Account Number: [id number] Date of Birth/Gender: Treating RN: 01-May-1941 (82 y.o. [name] Primary Care Physician: [name] Other Clinician: Referring Physician: Treating Physician/Extender: Azucena Kuba in Treatment: 21 Education Assessment Education Provided To: Patient Education Topics Provided Venous: Methods: Explain/Verbal, Printed Responses: State content correctly Wound/Skin Impairment: Methods: Explain/Verbal, Printed Responses: State content correctly Electronic Signature(s) Signed: 03/15/2022 4:24:27 PM By: [name] Entered By: [name] on 03/15/2022  09:33:51 -------------------------------------------------------------------------------- Wound Assessment Details Patient Name: Date of Service: [name], [name] 03/15/2022 9:30 A M Medical Record Number: [id number] Patient Account Number: [id number] Date of Birth/Sex: Treating RN: 07-28-1940 (81 y.o. [name] Primary Care Tegan Burnside: [name] Other Clinician: Referring Khristian Phillippi: Treating Lucy Boardman/Extender: Azucena Kuba in Treatment: 21 Wound Status Wound Number: 6 Primary Vasculitis Etiology: Wound Location: Left, Lateral Foot Wound Open Wounding Event: Gradually Appeared Status: Date Acquired: 07/25/2021 Comorbid Anemia, Chronic Obstructive Pulmonary Disease (COPD), Weeks Of Treatment: 21 History: Hypertension, Peripheral Venous Disease, Type II Diabetes, Clustered Wound: No Osteoarthritis, Neuropathy Photos Wound Measurements Length: (cm) 5.5 Width: (cm) 3.5 Depth: (cm) 0.2 Area: (cm) 15.119 Volume: (cm) 3.024 % Reduction in Area: 22.4% % Reduction in Volume: 22.4% Epithelialization: Small (1-33%) Tunneling: No Undermining: No Wound Description Classification: Full Thickness Without Exposed Support Structures Wound Margin: Distinct, outline attached Exudate Amount: Large Exudate Type: Serosanguineous Exudate Color: red, brown Foul Odor After Cleansing: No Slough/Fibrino Yes Wound Bed Granulation Amount:  Large (67-100%) Exposed Structure Granulation Quality: Red, Pink, Hyper-granulation Fascia Exposed: No Necrotic Amount: Small (1-33%) Fat Layer (Subcutaneous Tissue) Exposed: Yes Necrotic Quality: Adherent Slough Tendon Exposed: No Muscle Exposed: No Joint Exposed: No Bone Exposed: No Periwound Skin Texture Texture Color No Abnormalities Noted: Yes No Abnormalities Noted: No Atrophie Blanche: No Moisture Cyanosis: No No Abnormalities Noted: Yes Ecchymosis: No Erythema: Yes Erythema Location:  Circumferential Hemosiderin Staining: No Mottled: No Pallor: No Rubor: No Temperature / Pain Temperature: No Abnormality Treatment Notes Wound #6 (Foot) Wound Laterality: Left, Lateral Cleanser Soap and Water Discharge Instruction: May shower and wash wound with dial antibacterial soap and water prior to dressing change. Peri-Wound Care Zinc Oxide Ointment 30g tube Discharge Instruction: Apply Zinc Oxide to periwound with each dressing change Topical Gentamicin Discharge Instruction: As directed by physician Primary Dressing Hydrofera Blue Ready Foam, 4x5 in Discharge Instruction: Apply to wound bed as instructed Secondary Dressing ABD Pad, 5x9 Discharge Instruction: Apply over primary dressing as directed. Secured With [employer] 4x5 (in/yd) Discharge Instruction: Secure with Coban as directed. Kerlix Roll Sterile, 4.5x3.1 (in/yd) Discharge Instruction: Secure with Kerlix as directed. 42M Medipore Soft Cloth Surgical T 2x10 (in/yd) ape Discharge Instruction: Secure with tape as directed. Compression Wrap ACE Wrap Discharge Instruction: For Compression Compression Stockings Add-Ons Electronic Signature(s) Signed: 03/15/2022 4:24:27 PM By: [name] Entered By: [name] on 03/15/2022 09:46:29 -------------------------------------------------------------------------------- Vitals Details Patient Name: Date of Service: [name], [name] 03/15/2022 9:30 A M Medical Record Number: [id number] Patient Account Number: [id number] Date of Birth/Sex: Treating RN: 09-04-1940 (81 y.o. [name] Primary Care Masashi Snowdon: [name] Other Clinician: Referring Lissete Maestas: Treating Celsey Asselin/Extender: Azucena Kuba in Treatment: 21 Vital Signs Time Taken: 09:36 Temperature (F): 98.2 Height (in): 66 Pulse (bpm): 64 Weight (lbs): 184 Respiratory Rate (breaths/min): 18 Body Mass Index (BMI): 29.7 Blood Pressure  (mmHg): 147/77 Reference Range: 80 - 120 mg / dl Electronic Signature(s) Signed: 03/15/2022 4:24:27 PM By: [name] Entered By: [name] on 03/15/2022 09:38:50

## 2022-03-15 NOTE — Progress Notes (Addendum)
AUSTEN, OYSTER ([id number]) Visit Report for 03/15/2022 Chief Complaint Document Details Patient Name: Date of Service: [place], [name] 03/15/2022 9:30 A M Medical Record Number: [id number] Patient Account Number: [id number] Date of Birth/Sex: Treating RN: 1940-11-04 (81 y.o. M) Primary Care Provider: [name] Other Clinician: Referring Provider: Treating Provider/Extender: Azucena Kuba in Treatment: 21 Information Obtained from: Patient Chief Complaint Bilateral foot ulcers Electronic Signature(s) Signed: 03/15/2022 9:23:07 AM By: [name] PA-C Entered By: [name] on 03/15/2022 09:23:07 -------------------------------------------------------------------------------- HPI Details Patient Name: Date of Service: [place], [name] 03/15/2022 9:30 A M Medical Record Number: [id number] Patient Account Number: [id number] Date of Birth/Sex: Treating RN: Oct 01, 1940 (81 y.o. M) Primary Care Provider: [name] Other Clinician: Referring Provider: Treating Provider/Extender: Azucena Kuba in Treatment: 21 History of Present Illness HPI Description: 81 year old gentleman known to have swelling of his left lower extremity with some ulceration along the medial ankle has been having these problems for the last 3-4 months and does not know how it came on. No history of injury or no history of any infection in this area. He is known to have diabetes mellitus controlled with diet, hyperlipidemia, hypertension and chronic lumbar back pain with sciatica which is being treated by his PCP. Last hemoglobin A1c was 6.3 in June 2017. Last medical history is significant for esophageal stricture, hiatal hernia, vitamin D deficiency, status post back surgery 3, hernia repair as a child for both groins, hiatal hernia repair, joint replacement and revision of a knee surgery on the right side. He has quit smoking in 1982. He  has never had a Doppler study of his lower extremity except remotely when he had knee surgery they looked for a blood clot on the right side. 09/13/2016 -- venous reflux study done on 09/12/2016 showed there is evidence of greater saphenous vein reflux in the left lower extremity and the small saphenous vein and the posterior thigh is not competent. 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 With these results he definitely needs a referral to the vascular surgeons 09/20/2016 -- he has an appointment to see the vascular surgeons on May 1 10/03/2016 -- patient had a 4 layer compression on his left lower extremity and had a lot of discomfort and pain. 10/11/2016-- was seen by Dr. Althea [name] -- review of his data he recommended staged laser ablation of his great and then small saphenous veins for reduction odd of his venous hypertension. He did examine his right leg with the SonoSite and he has dilatation of the saphenous vein on the right lower extremity too. Since there was no evidence of ulceration he recommended observation of the right lower extremity. He recommended to continue with local wound care at the wound center until his wound was completely healed. 10/18/2016 -- the patient has not been very compliant with his compression, his diet and his diuretics and as a result his lymphedema has increased markedly 10/25/2016 -- he has been compliant this week, has worn his 2 press compression wraps, taken his diuretics and is watching his salt intake. 11/08/2016 -- he had his venous procedure done last week and details of this have been reported and reviewed in his electronic medical record. he had a laser ablation of his great saphenous vein and this was done from mid calf to just below the saphenofemoral junction. He would return next week for ultrasound follow-up. He  will then undergo the small saphenous vein ablation in a few weeks. 11/15/2016 -- his postprocedure visit showed  good closure of his left great saphenous vein from the mid calf to 1-1/2 cm from the saphenofemoral junction. He had excellent early results from the ablation. The ablation of the left small saphenous vein was planned in a week 12/06/2016 -- he had his small saphenous vein venous ablation a week before and the duplex showed closure of his small saphenous vein to within half centimeter office saphenous popliteal junction with no DVT and he was pleased with the results. He recommended continue with elevation and compression and to see them back on an as-needed basis 12/19/16; the patient's wound is actually closed. Sometime over the weekend he developed a small abrasion on the lower calf just above the original wound on the left medial malleolus although this is closed as well Readmission: 01/18/18 on evaluation today patient presents after having last been seen in our clinic July 2018. Subsequently he is a reoccurrence of the ulcers on the left ankle both medial and now a new area lateral that had been present back room for about a month he tells me. He states that this has done very well for about a year he really does not know of any injury or anything that happened that would have caused this reopening at this point. He has continued to wear compression stockings which is appropriate. He does not have lymphedema pumps. He has continued to also keep the area clean and dry as best he could. With that being said now that is been reopened is been harder for him to take care of this as far as compression stockings are concerned keeping them clean along with continuing to manage his fluid and swelling. His ABI appears to be great on the left registering at 1.14 today. He has previously undergone a venous ablation. Other than compression/lymphedema pumps he's really done everything that he can to help manage and prevent these ulcers from forming. 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 01/30/18 on evaluation today  patient appears to actually be doing very well in regard to his medial ankle ulcer. It's the lateral ulcer that actually is not doing quite as well today. Fortunately he did have some alginate left ovary start using this on the wounds and the medial ankle actually seems to be doing much better. The lateral ankle does actually need something to con a help with slough and buildup. The collagen really did not seem to do much for him in that regard. 02/13/18 on evaluation today patient appears to be doing about the same in regard to his ulcers. Unfortunately I do not feel like were making good progress at this time. When I have debrided the wound the slough seems to come back fairly quickly in the lateral ankle ulcer. There does not appear to be any evidence of infection at this time. No fevers, chills, nausea, or vomiting noted at this time. 02/27/18 on evaluation today patient actually appears to be showing some signs of improvement in regard to both wound areas. I'm insurance on the medial aspect of his left lower extremity at the ankle whether or not the alginate may be sticking and causing some tearing off of new skin attempting to grow. With that being said the patient states it does not seem to be doing such just pulls up some of the dried dead skin around. Nonetheless this is something I wanted to watch out for going forward and  actually I recommended that he take the dressing off in the shower when you could get it completely wet before removal to see how this does. Subsequently in regard to the lateral ankle ulcer he still has slough noted at this point although I do believe that he is tolerating the Medihoney much better I wish you could have gotten the Santyl but unfortunately the Santyl was too expensive gonna cost him $250. 03/06/18 on evaluation today patient appears to be doing about the same in regard to both ulcers of his left medial and lateral malleolus sites. He's been tolerating the  dressing changes without complication. Unfortunately things do not seem to be improving in regard to either side at this time. Overall I think we may need to switch things up a little bit as far as the way we are performing the dressing changes currently. 03/27/18 on evaluation today patient's wound bed actually appears to be doing much better at both locations in regard to his ankle ulcers. He has been tolerating the dressing changes at this time without complication. Overall I'm very pleased with how things appear. The patient likewise states that he's much better in regard to discomfort at this time. 04/10/18 on evaluation today patient actually appears to be doing well in regard to his left lateral ankle ulcer. He has been tolerating the dressing changes without complication. Fortunately there does not appear to be any evidence of infection. Overall I do feel like he is tolerating the Medihoney without complication. 04/24/18 upon evaluation today patient's wound actually appears to be healed on the lateral aspect of his ankle the medial aspect still continues to remain close without any evidence of complication at this time. Overall I have been extremely pleased with how things have progressed up to this point. The patient likewise is very happy he's not having any significant pain this time. He does wears compression stocking on a regular basis. Readmission: 10-19-2021 upon evaluation today patient presents for evaluation here in the clinic concerning issues with a left lateral ulcer as well as a right lateral ulcer both along the aspect of his foot getting close to the ankle. This left area is the same place that I treated back in 2024. 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 This appears to be very inflamed and is also very painful. In the past he has been completely unable to tolerate the use of any compression wraps unfortunately. There does not  appear to be any evidence of active infection locally nor systemically at this time which is good news. I do believe however we need to try to see what we can do to calm down the inflammation I think triamcinolone topically as well as oral prednisone would likely be indicated in this case. Patient's medical history really has not changed significantly since I last saw him in actually 2019 though I think is stated 2020 above. 10-26-21 upon evaluation today patient appears to still be having a lot of irritation and inflammation in regard to the ankles laterally. The left is greater than the right. With that being said I do believe that he would benefit from a biopsy to confirm whether or not this may indeed be vasculitis. It actually seems like that physically and on examination but again I want to be sure that we are on the right track as far as treatment is concerned. The good news is he did not have a DVT I called  him about that on Friday this was all in regard to the right leg. Overall I am very pleased in that regard. 11-02-2021 upon evaluation today patient appears to be doing poorly still in regard to his left lower extremity. Apparently he had a fever on Sunday which was around 101 and he was severely sick feeling and disoriented according to his wife. She came with him today because she did not think that he would actually tell me what was going on. With that being said unfortunately he did not go to the hospital he actually has been doing a little better over the past few days he been taking amoxicillin he has not taken any other medications orally at this point that are different. He has not been on any antibiotics either more recently other than the amoxicillin. Nonetheless he has not had a repeat in the past 3 days of that issue. 11-09-2021 upon evaluation today patient presents after having had quite an ordeal over the past several days. Subsequently yesterday I got a call from [facility] here in  [place] concerning the patient and the fact that he was having bleeding that been going on for the past 24 hours and was not stopping. Subsequently my advice was to have the patient go to the ER for further evaluation and treatment. It seems to me that he had a regular way part of the wound down to the point that a blood vessel and open. Apparently he was filling up trash bags wrapped around his foot with blood. In fact his lab review which I did do him as well today showed that he went from a white hemoglobin on 09-14-2021 of 13.6 down to a hemoglobin of 11.2 on 11-08-2021 this was yesterday. Subsequently this is due to the amount of blood loss that he has had acutely. Again I do not believe he is at transfusion stage but at the same time he is extremely weak and states that a couple times when he is going to stand up he is actually fallen back into his chair. Nonetheless I do believe that he probably needs to supplement with iron and I did recommend that there are some over-the-counter products he could get in this regard. Subsequently also suggested that the patient needs to be drinking plenty of water he apparently drinks Coke and he drinks coffee but that is about it. Subsequently following this conversation his wife was present during this time as well and I do think that he is going to try to drink more I think it is of utmost importance. With that being said I did review his PCR culture as well it was positive for 2 organisms. This was Pseudomonas and Enterobacter. Both were showing up as being very prevalent in the PCR culture and subsequently Cipro will take care of the situation which is what I am recommending at this point based on what we see. This will take the place of the Bactrim and can have him discontinue the Bactrim the only thing is he is getting need to stop taking the hydroxyzine if he has been taking it that is the Vistaril and he tells me that he only takes this when he itches he  has not been taking it recently. 6/7; patient arrives in clinic today with the wound on the right foot actually looking some better the area on the left lateral looking about the same. Problematically, he is having edema fluid leakage on the medial part of the left foot and ankle  causing skin breakdown but no open wound. He has been very resistant to the notion of compression wraps. He has been using Santyl on both wound areas at home changing the dressing himself Partway through our visit today it became clear he had had a fall on Sunday 3 days ago. Since then he has had a lot of problems with his right shoulder 11-23-2021 upon evaluation today patient appears to be doing well with regard to his wound. Has been tolerating dressing changes without complication. Fortunately there does not appear to be any signs of infection he was wrapped last week when Dr. Dellia Nims saw him on the left. He was having some breakdown medially which they were concerned about. He did keep the wrap on until Monday but states it got extremely wet. For that reason I am thinking of doing a nurse visit on Friday so that we can get some of the swelling down right now by Friday we should have a lot of that out that we can switch out and put on a fresh dressing to get him through till next Wednesday. He is in agreement with giving that a try. 11-30-2021 upon evaluation today patient actually appears to be doing better with regard to the overall appearance of his wound. Fortunately there does not appear to be any signs of active infection locally or systemically which is great news. With that being said he has been tolerating the dressing changes without complication other than the fact that from Wednesday to Friday he did well with the compression wrap from Friday till yesterday he tells me the drainage was so bad and the smell was so bad he had to take this off. It does appear that he is very macerated around the lower portion down  around the heel and I do think that this is an issue. Unfortunately we cannot get home health to help with this currently. 12-07-2021 upon evaluation today patient appears to be doing well currently in regard to his wound. He is actually showing signs of excellent improvement which is great news. I am very pleased with where things stand. I do think that the progress is being made. With that being said the patient is tolerating the Santyl quite well and I think is doing an awesome job for him. 12-14-2021 upon evaluation today patient appears to be doing well with regard to his wounds he is making progress is still draining a lot however which I completely understand. With that being said fortunately there does not appear to be any signs of active infection locally or systemically at this time. 12-21-2021 upon evaluation today patient appears to be doing well currently in regard to his wound. He is actually showing signs of significant improvement this is slow but nonetheless week by week we are seeing this improve on the lateral aspect of his foot. The measurements are little bit better but more importantly the wound surface is actually a whole lot cleaner. The patient is pleased to hear this. With that being said he tells me that it is really hard for him to see exactly what all is going on. Nonetheless I am very pleased with all the new red tissue that were seeing granulating and hopefully we get this clean shortly and we can even switch to a different dressing to try to get it to close. 12-28-2021 upon evaluation today patient appears to be doing well currently in regard to his wound is actually showing signs of improvement we are going to perform  some debridement today to clearway some of the necrotic debris. Overall I think that we are at the point where we do not have to use Santyl over the home wound I think using it just on the upper portion may be appropriate if we can get this completely cleaned  away. 7/6; substantial wound on the left lateral foot. Using Santyl and Hydrofera Blue there is been good improvement since the last time I saw this. The patient does not tolerate compression wraps therefore he is using Ace wraps. Fortunately he appears to be doing this fairly well 01-11-22 upon evaluation today patient appears to be doing better in regard to his wound in general though he is having some issues here with blue-green drainage. I am going to go ahead and see about getting him sent in a prescription for gentamicin cream. 01-18-2022 upon evaluation today patient actually appears to be doing quite well in regards to his wound there green drainage we will previously done with has improved. Fortunately I do not see any evidence of active infection locally or systemically which is great news. No fever chills noted 02-01-2022 upon evaluation today patient appears to be doing well with regard to his wound. Has been tolerating the dressing changes without complication. Fortunately I see no signs of active infection locally or systemically at this time which is great news. 02-15-2022 upon evaluation today patient appears to be doing excellent in regard to his wound he is actually showing signs of good improvement. This wound is looking much better and in fact were getting close to not even needing the Santyl any longer. Overall though I think is still good to be helpful with the more proximal portion of the wound. 03-01-2022 upon evaluation today patient appears to be doing well with regard to his ulcer although there is some irritation in the skin around. I do believe this is in a large part due to drainage and I discussed that with him today. I do think that we need to try to see what we can do to try to prevent this from being an ongoing issue going forward. 03-15-2022 upon evaluation today patient unfortunately is having increased discomfort and pain. It seems like this is likely due to infection based  on what I am seeing the wound is also measuring a little bit larger than what it has been in the past. Unfortunately I am seeing some signs of the patient likely has been having increased drainage she also has the [facility] ready not the ready transfer which also does not allow anything to drain completely through to an outside dressing I think this can be a little bit of an issue as well. Electronic Signature(s) Signed: 03/15/2022 10:20:31 AM By: [name] PA-C Entered By: [name] on 03/15/2022 10:20:30 -------------------------------------------------------------------------------- Physical Exam Details Patient Name: Date of Service: [place], [name] 03/15/2022 9:30 A M Medical Record Number: [id number] Patient Account Number: [id number] Date of Birth/Sex: Treating RN: 12/31/1940 (81 y.o. M) Primary Care Provider: [name] Other Clinician: Referring Provider: Treating Provider/Extender: Azucena Kuba in Treatment: 21 Constitutional Well-nourished and well-hydrated in no acute distress. Respiratory normal breathing without difficulty. Psychiatric this patient is able to make decisions and demonstrates good insight into disease process. Alert and Oriented x 3. pleasant and cooperative. Notes Upon evaluation patient's wound actually appears better in general although there is some irritation around the edge which is actually making this show up to be  a little bit larger. I think this is secondary to the infection. Subsequently I do believe that we need to try to get this under control as quickly as we can. In the past he has had issues with Pseudomonas there is actually some blue-green drainage going on here as well that has me concerned about the same occurring at this point. Electronic Signature(s) Signed: 03/15/2022 10:21:24 AM By: [name] PA-C Entered By: [name] on 03/15/2022  10:21:24 -------------------------------------------------------------------------------- Physician Orders Details Patient Name: Date of Service: [place], [name] 03/15/2022 9:30 A M Medical Record Number: [id number] Patient Account Number: [id number] Date of Birth/Sex: Treating RN: March 24, 1941 (81 y.o. 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 - with Bernette Redbird, Room 7) Return A Other: - Prescription for Levaquin sent to pharmacy, take as directed. Prism:Supplies Anesthetic (In clinic) Topical Lidocaine 5% applied to wound bed (In clinic) Topical Lidocaine 4% applied to wound bed Cellular or Tissue Based Products Cellular or Tissue Based Product Type: - Will run IVR for Apligraf= $295 [employer], $3400 out of pocket with $1440 met. Bathing/ Shower/ Hygiene May shower and wash wound with soap and water. - wash with antibacterial soap when changing dressing Edema Control - Lymphedema / SCD / Other Elevate legs to the level of the heart or above for 30 minutes daily and/or when sitting, a frequency of: - throughout the day Patient to wear own compression stockings every day. - Use stocking daily to right leg Moisturize legs daily. - Eucerin (in jar) Additional Orders / Instructions Follow Nutritious Diet Wound  Treatment Wound #6 - Foot Wound Laterality: Left, Lateral Cleanser: Soap and Water Every Other Day/30 Days Discharge Instructions: May shower and wash wound with dial antibacterial soap and water prior to dressing change. Peri-Wound Care: Zinc Oxide Ointment 30g tube Every Other Day/30 Days Discharge Instructions: Apply Zinc Oxide to periwound with each dressing change Topical: Gentamicin Every Other Day/30 Days Discharge Instructions: As directed by physician Prim Dressing: Hydrofera Blue Ready Foam, 4x5 in Every Other Day/30 Days ary Discharge Instructions: Apply to wound bed as instructed Secondary Dressing: ABD Pad, 5x9 (Generic) Every Other Day/30 Days Discharge Instructions: Apply over primary dressing as directed. Secured With: [place] Self-Adherent Wrap 4x5 (in/yd) Every Other Day/30 Days Discharge Instructions: Secure with [place] as directed. Secured With: [employer], 4.5x3.1 (in/yd) (Generic) Every Other Day/30 Days Discharge Instructions: Secure with Kerlix as directed. Secured With: 70M Medipore Public affairs consultant Surgical T 2x10 (in/yd) (Generic) Every Other Day/30 Days ape Discharge Instructions: Secure with tape as directed. Compression Wrap: ACE Wrap Every Other Day/30 Days Discharge Instructions: For Compression Laboratory erobe culture (MICRO) - Standard Culture - (ICD10 [phone] - Non-pressure chronic ulcer of other Bacteria identified in Unspecified specimen by A part of left foot with fat layer exposed) LOINC Code: 762-8 Convenience Name: Aerobic culture-specimen not specified Patient Medications llergies: latex, Feraheme, Naprosyn, levothyroxine A Notifications Medication Indication Start End  03/15/2022 levofloxacin DOSE 1 - oral 500 mg tablet - 1 tablet oral taken 1 time per day for 30 days. Do not take hydroxyzine with this medication or your zinc. Electronic Signature(s) Signed: 03/15/2022 10:22:46 AM By: [name] PA-C Entered By: [name] on  03/15/2022 10:22:45 -------------------------------------------------------------------------------- Problem List Details Patient Name: Date of Service: [place], [name] 03/15/2022 9:30 A M Medical Record Number: [id number] Patient Account Number: [id number] Date of Birth/Sex: Treating RN: 03/24/1941 (81 y.o. 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, [name] 03/15/2022 9:30 A M Medical Record Number: [id number] Patient Account Number: [id number] Date of Birth/Sex: Treating RN: [birth year] (81 y.o. M) Primary Care Provider: [name] Other Clinician: Referring Provider: Treating Provider/Extender: Azucena Kuba in Treatment: 21 Subjective Chief Complaint Information obtained from Patient Bilateral foot ulcers History of Present Illness (HPI) 81 year old gentleman known to have swelling of his left lower extremity with some  ulceration along the medial ankle has been having these problems for the last 3-4 months and does not know how it came on. No history of injury or no history of any infection in this area. He is known to have diabetes mellitus controlled with diet, hyperlipidemia, hypertension and chronic lumbar back pain with sciatica which is being treated by his PCP. Last hemoglobin A1c was 6.3 in June 2017. Last medical history is significant for esophageal stricture, hiatal hernia, vitamin D deficiency, status post back surgery o3, hernia repair as a child for both groins, hiatal hernia repair, joint replacement and revision of a knee surgery on the right side. He has quit smoking in 1982. He has never had a Doppler study of his lower extremity except remotely when he had knee surgery they looked for a blood clot on the right side. 09/13/2016 -- venous reflux study done on 09/12/2016 showed there is evidence of greater saphenous vein reflux in the left lower extremity and the small saphenous vein and the posterior thigh is not competent. 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 With these results he definitely needs a referral to the vascular surgeons 09/20/2016 -- he has an appointment to see the vascular surgeons on May 1 10/03/2016 -- patient had a 4 layer compression on his left lower extremity and had a lot of discomfort and pain. 10/11/2016-- was seen by Dr. Althea [name] -- review of his data he recommended staged laser ablation of his great and then small saphenous veins for reduction odd of his venous hypertension. He did examine his right leg with the SonoSite and he has dilatation of the saphenous vein on the right lower extremity too. Since  there was no evidence of ulceration he recommended observation of the right lower extremity. He recommended to continue with local wound care at the wound center until his wound was completely healed. 10/18/2016 -- the patient has not been very  compliant with his compression, his diet and his diuretics and as a result his lymphedema has increased markedly 10/25/2016 -- he has been compliant this week, has worn his 2 press compression wraps, taken his diuretics and is watching his salt intake. 11/08/2016 -- he had his venous procedure done last week and details of this have been reported and reviewed in his electronic medical record. he had a laser ablation of his great saphenous vein and this was done from mid calf to just below the saphenofemoral junction. He would return next week for ultrasound follow-up. He will then undergo the small saphenous vein ablation in a few weeks. 11/15/2016 -- his postprocedure visit showed good closure of his left great saphenous vein from the mid calf to 1-1/2 cm from the saphenofemoral junction. He had excellent early results from the ablation. The ablation of the left small saphenous vein was planned in a week 12/06/2016 -- he had his small saphenous vein venous ablation a week before and the duplex showed closure of his small saphenous vein to within half centimeter office saphenous popliteal junction with no DVT and he was pleased with the results. He recommended continue with elevation and compression and to see them back on an as-needed basis 12/19/16; the patient's wound is actually closed. Sometime over the weekend he developed a small abrasion on the lower calf just above the original wound on the left medial malleolus although this is closed as well Readmission: 01/18/18 on evaluation today patient presents after having last been seen in our clinic July 2018. Subsequently he is a reoccurrence of the ulcers on the left ankle both medial and now a new area lateral that had been present back room for about a month he tells me. He states that this has done very well for about a year he really does not know of any injury or anything that happened that would have caused this reopening at this point. He has  continued to wear compression stockings which is appropriate. He does not have lymphedema pumps. He has continued to also keep the area clean and dry as best he could. With that being said now that is been reopened is been harder for him to take care of this as far as compression stockings are concerned keeping them clean along with continuing to manage his fluid and swelling. His ABI appears to be great on the left registering at 1.14 today. He has previously undergone a venous ablation. Other than compression/lymphedema pumps he's really done everything that he can to help manage and prevent these ulcers from forming. 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 01/30/18 on evaluation today patient appears to actually be doing very well in regard to his medial ankle ulcer. It's the lateral ulcer that actually is not doing quite as well today. Fortunately he did have some alginate left ovary start using this on the wounds and the medial ankle actually seems to be doing much better. The lateral ankle does actually need something to con a help with slough and buildup. The collagen really did not seem to do much for him in that regard. 02/13/18 on evaluation today patient appears to be doing about the same in regard to his ulcers. Unfortunately I do not feel like were making  good progress at this time. When I have debrided the wound the slough seems to come back fairly quickly in the lateral ankle ulcer. There does not appear to be any evidence of infection at this time. No fevers, chills, nausea, or vomiting noted at this time. 02/27/18 on evaluation today patient actually appears to be showing some signs of improvement in regard to both wound areas. I'm insurance on the medial aspect of his left lower extremity at the ankle whether or not the alginate may be sticking and causing some tearing off of new skin attempting to grow. With that being said the patient states it does not seem to be doing such just pulls up some  of the dried dead skin around. Nonetheless this is something I wanted to watch out for going forward and actually I recommended that he take the dressing off in the shower when you could get it completely wet before removal to see how this does. Subsequently in regard to the lateral ankle ulcer he still has slough noted at this point although I do believe that he is tolerating the Medihoney much better I wish you could have gotten the Santyl but unfortunately the Santyl was too expensive gonna cost him $250. 03/06/18 on evaluation today patient appears to be doing about the same in regard to both ulcers of his left medial and lateral malleolus sites. He's been tolerating the dressing changes without complication. Unfortunately things do not seem to be improving in regard to either side at this time. Overall I think we may need to switch things up a little bit as far as the way we are performing the dressing changes currently. 03/27/18 on evaluation today patient's wound bed actually appears to be doing much better at both locations in regard to his ankle ulcers. He has been tolerating the dressing changes at this time without complication. Overall I'm very pleased with how things appear. The patient likewise states that he's much better in regard to discomfort at this time. 04/10/18 on evaluation today patient actually appears to be doing well in regard to his left lateral ankle ulcer. He has been tolerating the dressing changes without complication. Fortunately there does not appear to be any evidence of infection. Overall I do feel like he is tolerating the Medihoney without complication. 04/24/18 upon evaluation today patient's wound actually appears to be healed on the lateral aspect of his ankle the medial aspect still continues to remain close without any evidence of complication at this time. Overall I have been extremely pleased with how things have progressed up to this point. The patient  likewise is very happy he's not having any significant pain this time. He does wears compression stocking on a regular basis. Readmission: 10-19-2021 upon evaluation today patient presents for evaluation here in the clinic concerning issues with a left lateral ulcer as well as a right lateral ulcer both along the aspect of his foot getting close to the ankle. This left area is the same place that I treated back in 2024. 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 This appears to be very inflamed and is also very painful. In the past he has been completely unable to tolerate the use of any compression wraps unfortunately. There does not appear to be any evidence of active infection locally nor systemically at this time which is good news. I do believe however we need to try to see what we can do  to calm down the inflammation I think triamcinolone topically as well as oral prednisone would likely be indicated in this case. Patient's medical history really has not changed significantly since I last saw him in actually 2019 though I think is stated 2020 above. 10-26-21 upon evaluation today patient appears to still be having a lot of irritation and inflammation in regard to the ankles laterally. The left is greater than the right. With that being said I do believe that he would benefit from a biopsy to confirm whether or not this may indeed be vasculitis. It actually seems like that physically and on examination but again I want to be sure that we are on the right track as far as treatment is concerned. The good news is he did not have a DVT I called him about that on Friday this was all in regard to the right leg. Overall I am very pleased in that regard. 11-02-2021 upon evaluation today patient appears to be doing poorly still in regard to his left lower extremity. Apparently he had a fever on Sunday which was around 101 and he was severely sick feeling and  disoriented according to his wife. She came with him today because she did not think that he would actually tell me what was going on. With that being said unfortunately he did not go to the hospital he actually has been doing a little better over the past few days he been taking amoxicillin he has not taken any other medications orally at this point that are different. He has not been on any antibiotics either more recently other than the amoxicillin. Nonetheless he has not had a repeat in the past 3 days of that issue. 11-09-2021 upon evaluation today patient presents after having had quite an ordeal over the past several days. Subsequently yesterday I got a call from [facility] here in [place] concerning the patient and the fact that he was having bleeding that been going on for the past 24 hours and was not stopping. Subsequently my advice was to have the patient go to the ER for further evaluation and treatment. It seems to me that he had a regular way part of the wound down to the point that a blood vessel and open. Apparently he was filling up trash bags wrapped around his foot with blood. In fact his lab review which I did do him as well today showed that he went from a white hemoglobin on 09-14-2021 of 13.6 down to a hemoglobin of 11.2 on 11-08-2021 this was yesterday. Subsequently this is due to the amount of blood loss that he has had acutely. Again I do not believe he is at transfusion stage but at the same time he is extremely weak and states that a couple times when he is going to stand up he is actually fallen back into his chair. Nonetheless I do believe that he probably needs to supplement with iron and I did recommend that there are some over-the-counter products he could get in this regard. Subsequently also suggested that the patient needs to be drinking plenty of water he apparently drinks Coke and he drinks coffee but that is about it. Subsequently following this conversation his wife was  present during this time as well and I do think that he is going to try to drink more I think it is of utmost importance. With that being said I did review his PCR culture as well it was positive for 2 organisms. This was Pseudomonas  and Enterobacter. Both were showing up as being very prevalent in the PCR culture and subsequently Cipro will take care of the situation which is what I am recommending at this point based on what we see. This will take the place of the Bactrim and can have him discontinue the Bactrim the only thing is he is getting need to stop taking the hydroxyzine if he has been taking it that is the Vistaril and he tells me that he only takes this when he itches he has not been taking it recently. 6/7; patient arrives in clinic today with the wound on the right foot actually looking some better the area on the left lateral looking about the same. Problematically, he is having edema fluid leakage on the medial part of the left foot and ankle causing skin breakdown but no open wound. He has been very resistant to the notion of compression wraps. He has been using Santyl on both wound areas at home changing the dressing himself Partway through our visit today it became clear he had had a fall on Sunday 3 days ago. Since then he has had a lot of problems with his right shoulder 11-23-2021 upon evaluation today patient appears to be doing well with regard to his wound. Has been tolerating dressing changes without complication. Fortunately there does not appear to be any signs of infection he was wrapped last week when Dr. Dellia Nims saw him on the left. He was having some breakdown medially which they were concerned about. He did keep the wrap on until Monday but states it got extremely wet. For that reason I am thinking of doing a nurse visit on Friday so that we can get some of the swelling down right now by Friday we should have a lot of that out that we can switch out and put on a fresh  dressing to get him through till next Wednesday. He is in agreement with giving that a try. 11-30-2021 upon evaluation today patient actually appears to be doing better with regard to the overall appearance of his wound. Fortunately there does not appear to be any signs of active infection locally or systemically which is great news. With that being said he has been tolerating the dressing changes without complication other than the fact that from Wednesday to Friday he did well with the compression wrap from Friday till yesterday he tells me the drainage was so bad and the smell was so bad he had to take this off. It does appear that he is very macerated around the lower portion down around the heel and I do think that this is an issue. Unfortunately we cannot get home health to help with this currently. 12-07-2021 upon evaluation today patient appears to be doing well currently in regard to his wound. He is actually showing signs of excellent improvement which is great news. I am very pleased with where things stand. I do think that the progress is being made. With that being said the patient is tolerating the Santyl quite well and I think is doing an awesome job for him. 12-14-2021 upon evaluation today patient appears to be doing well with regard to his wounds he is making progress is still draining a lot however which I completely understand. With that being said fortunately there does not appear to be any signs of active infection locally or systemically at this time. 12-21-2021 upon evaluation today patient appears to be doing well currently in regard to his wound. He is  actually showing signs of significant improvement this is slow but nonetheless week by week we are seeing this improve on the lateral aspect of his foot. The measurements are little bit better but more importantly the wound surface is actually a whole lot cleaner. The patient is pleased to hear this. With that being said he tells me  that it is really hard for him to see exactly what all is going on. Nonetheless I am very pleased with all the new red tissue that were seeing granulating and hopefully we get this clean shortly and we can even switch to a different dressing to try to get it to close. 12-28-2021 upon evaluation today patient appears to be doing well currently in regard to his wound is actually showing signs of improvement we are going to perform some debridement today to clearway some of the necrotic debris. Overall I think that we are at the point where we do not have to use Santyl over the home wound I think using it just on the upper portion may be appropriate if we can get this completely cleaned away. 7/6; substantial wound on the left lateral foot. Using Santyl and Hydrofera Blue there is been good improvement since the last time I saw this. The patient does not tolerate compression wraps therefore he is using Ace wraps. Fortunately he appears to be doing this fairly well 01-11-22 upon evaluation today patient appears to be doing better in regard to his wound in general though he is having some issues here with blue-green drainage. I am going to go ahead and see about getting him sent in a prescription for gentamicin cream. 01-18-2022 upon evaluation today patient actually appears to be doing quite well in regards to his wound there green drainage we will previously done with has improved. Fortunately I do not see any evidence of active infection locally or systemically which is great news. No fever chills noted 02-01-2022 upon evaluation today patient appears to be doing well with regard to his wound. Has been tolerating the dressing changes without complication. Fortunately I see no signs of active infection locally or systemically at this time which is great news. 02-15-2022 upon evaluation today patient appears to be doing excellent in regard to his wound he is actually showing signs of good improvement. This wound  is looking much better and in fact were getting close to not even needing the Santyl any longer. Overall though I think is still good to be helpful with the more proximal portion of the wound. 03-01-2022 upon evaluation today patient appears to be doing well with regard to his ulcer although there is some irritation in the skin around. I do believe this is in a large part due to drainage and I discussed that with him today. I do think that we need to try to see what we can do to try to prevent this from being an ongoing issue going forward. 03-15-2022 upon evaluation today patient unfortunately is having increased discomfort and pain. It seems like this is likely due to infection based on what I am seeing the wound is also measuring a little bit larger than what it has been in the past. Unfortunately I am seeing some signs of the patient likely has been having increased drainage she also has the [facility] ready not the ready transfer which also does not allow anything to drain completely through to an outside dressing I think this can be a little bit of an issue as well. Objective  Constitutional Well-nourished and well-hydrated in no acute distress. Vitals Time Taken: 9:36 AM, Height: 66 in, Weight: 184 lbs, BMI: 29.7, Temperature: 98.2 F, Pulse: 64 bpm, Respiratory Rate: 18 breaths/min, Blood Pressure: 147/77 mmHg. Respiratory normal breathing without difficulty. Psychiatric this patient is able to make decisions and demonstrates good insight into disease process. Alert and Oriented x 3. pleasant and cooperative. General Notes: Upon evaluation patient's wound actually appears better in general although there is some irritation around the edge which is actually making this show up to be a little bit larger. I think this is secondary to the infection. Subsequently I do believe that we need to try to get this under control as quickly as we can. In the past he has had issues with Pseudomonas  there is actually some blue-green drainage going on here as well that has me concerned about the same occurring at this point. Integumentary (Hair, Skin) Wound #6 status is Open. Original cause of wound was Gradually Appeared. The date acquired was: 07/25/2021. The wound has been in treatment 21 weeks. The wound is located on the Left,Lateral Foot. The wound measures 5.5cm length x 3.5cm width x 0.2cm depth; 15.119cm^2 area and 3.024cm^3 volume. There is Fat Layer (Subcutaneous Tissue) exposed. There is no tunneling or undermining noted. There is a large amount of serosanguineous drainage noted. The wound margin is distinct with the outline attached to the wound base. There is large (67-100%) red, pink, hyper - granulation within the wound bed. There is a small (1-33%) amount of necrotic tissue within the wound bed including Adherent Slough. The periwound skin appearance had no abnormalities noted for texture. The periwound skin appearance had no abnormalities noted for moisture. The periwound skin appearance exhibited: Erythema. The periwound skin appearance did not exhibit: Atrophie Blanche, Cyanosis, Ecchymosis, Hemosiderin Staining, Mottled, Pallor, Rubor. The surrounding wound skin color is noted with erythema which is circumferential. Periwound temperature was noted as No Abnormality. 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 - with Bernette Redbird, Room 7) Other: - Prescription for Levaquin sent to pharmacy, take as directed. Prism:Supplies Anesthetic: (In clinic) Topical  Lidocaine 5% applied to wound bed (In clinic) Topical Lidocaine 4% applied to wound bed Cellular or Tissue Based Products: Cellular or Tissue Based Product Type: - Will run IVR for Apligraf= $295 CoPay, $3400 out of pocket with $1440 met. Bathing/ Shower/ Hygiene: May shower and wash wound with soap and water. - wash with antibacterial soap when changing dressing Edema Control - Lymphedema / SCD / Other: Elevate legs to the level of the heart or above for 30 minutes daily and/or when sitting, a frequency of: - throughout the day Patient to wear own compression stockings every day. - Use stocking daily to right leg Moisturize legs daily. - Eucerin (in jar) Additional Orders / Instructions: Follow Nutritious Diet Laboratory ordered were: Aerobic culture-specimen not specified - Standard Culture The following medication(s) was prescribed: levofloxacin oral 500 mg tablet 1 1 tablet oral taken 1 time per day for 30 days. Do not take hydroxyzine with this medication or your zinc. starting 03/15/2022 WOUND #6: - Foot Wound Laterality: Left, Lateral Cleanser: Soap and  Water Every Other Day/30 Days Discharge Instructions: May shower and wash wound with dial antibacterial soap and water prior to dressing change. Peri-Wound Care: Zinc Oxide Ointment 30g tube Every Other Day/30 Days Discharge Instructions: Apply Zinc Oxide to periwound with each dressing change Topical: Gentamicin Every Other Day/30 Days Discharge Instructions: As directed by physician Prim Dressing: Hydrofera Blue Ready Foam, 4x5 in Every Other Day/30 Days ary Discharge Instructions: Apply to wound bed as instructed Secondary Dressing: ABD Pad, 5x9 (Generic) Every Other Day/30 Days Discharge Instructions: Apply over primary dressing as directed. Secured With: Coban Self-Adherent Wrap 4x5 (in/yd) Every Other Day/30 Days Discharge Instructions: Secure with Coban as directed. Secured With: [employer], 4.5x3.1 (in/yd)  (Generic) Every Other Day/30 Days Discharge Instructions: Secure with Kerlix as directed. Secured With: 43M Medipore Public affairs consultant Surgical T 2x10 (in/yd) (Generic) Every Other Day/30 Days ape Discharge Instructions: Secure with tape as directed. Com pression Wrap: ACE Wrap Every Other Day/30 Days Discharge Instructions: For Compression 1. Based on what I am seeing I am going going to send in a prescription for him for the Levaquin which I think is probably going to be the best way to go. He is previously taking the Cipro and done well with that. My plan is to actually have him on this for a month to try to make sure that we get the infection completely under control since this has been an ongoing back-and-forth issue depending on the results of the wound culture which also obtained today we will make the adjustments in care as we need to but again traditionally this has been his problem as far as bacterial infections are concerned. 2. I am good recommend he continue with the [facility] is can be using topical gentamicin. 3. I am also can recommend that he continue to monitor for any signs of infection if anything changes he should contact the office and let me know. We will see patient back for reevaluation in 1 week here in the clinic. If anything worsens or changes patient will contact our office for additional recommendations. Electronic Signature(s) Signed: 03/15/2022 10:23:16 AM By: [name] PA-C Entered By: [name] on 03/15/2022 10:23:16 -------------------------------------------------------------------------------- SuperBill Details Patient Name: Date of Service: [place], [name] 03/15/2022 Medical Record Number: [id number] Patient Account Number: [id number] Date of Birth/Sex: Treating RN: 1940/09/09 (81 y.o. 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 layer exposed E11.622 Type 2 diabetes mellitus with other skin ulcer Facility Procedures CPT4 Code: 91478295 Description: 99213 - WOUND CARE VISIT-LEV 3 EST PT Modifier: Quantity: 1 Physician Procedures : CPT4 Code Description Modifier [id number] - WC PHYS LEVEL 4 - EST PT ICD-10 Diagnosis Description I87.333 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 extremity I89.0 Lymphedema, not elsewhere classified  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

## 2022-03-16 ENCOUNTER — Encounter: Payer: Medicare HMO | Admitting: Internal Medicine

## 2022-03-20 LAB — AEROBIC/ANAEROBIC CULTURE W GRAM STAIN (SURGICAL/DEEP WOUND): Gram Stain: NONE SEEN

## 2022-03-22 ENCOUNTER — Encounter (HOSPITAL_BASED_OUTPATIENT_CLINIC_OR_DEPARTMENT_OTHER): Payer: Medicare HMO | Admitting: Physician Assistant

## 2022-03-22 DIAGNOSIS — E11621 Type 2 diabetes mellitus with foot ulcer: Secondary | ICD-10-CM | POA: Diagnosis not present

## 2022-03-22 DIAGNOSIS — L97512 Non-pressure chronic ulcer of other part of right foot with fat layer exposed: Secondary | ICD-10-CM | POA: Diagnosis not present

## 2022-03-22 DIAGNOSIS — I776 Arteritis, unspecified: Secondary | ICD-10-CM | POA: Diagnosis not present

## 2022-03-22 DIAGNOSIS — E1151 Type 2 diabetes mellitus with diabetic peripheral angiopathy without gangrene: Secondary | ICD-10-CM | POA: Diagnosis not present

## 2022-03-22 DIAGNOSIS — I87333 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 extremity: Secondary | ICD-10-CM | POA: Diagnosis not present

## 2022-03-22 DIAGNOSIS — I890 Lymphedema, not elsewhere classified: Secondary | ICD-10-CM | POA: Diagnosis not present

## 2022-03-22 DIAGNOSIS — L97522 Non-pressure chronic ulcer of other part of left foot with fat layer exposed: Secondary | ICD-10-CM | POA: Diagnosis not present

## 2022-03-22 DIAGNOSIS — G8929 Other chronic pain: Secondary | ICD-10-CM | POA: Diagnosis not present

## 2022-03-22 DIAGNOSIS — E11622 Type 2 diabetes mellitus with other skin ulcer: Secondary | ICD-10-CM | POA: Diagnosis not present

## 2022-03-22 NOTE — Progress Notes (Signed)
[name] Fernandez ([id number]) [id number] Page 1 of 8 Visit Report for 03/22/2022 Arrival Information Details Patient Name: Date of Service: [place], [name] 03/22/2022 11:00 A M Medical Record Number: [id number] Patient Account Number: [id number] Date of Birth/Sex: Treating RN: [birth year] (81 y.o. M) Primary Care Jahmiyah Dullea: [name] Other Clinician: Referring Arelie Kuzel: Treating Laylee Schooley/Extender: Azucena Kuba in Treatment: 61 Visit Information History Since Last Visit Added or deleted any medications: No Patient Arrived: Ambulatory Any new allergies or adverse reactions: No Arrival Time: 10:56 Had a fall or experienced change in No Accompanied By: self activities of daily living that may affect Transfer Assistance: None risk of falls: Patient Identification Verified: Yes Signs or symptoms of abuse/neglect since last visito No Patient Requires Transmission-Based Precautions: No Hospitalized since last visit: No Patient Has Alerts: No Implantable device outside of the clinic excluding No cellular tissue based products placed in the center since last visit: Pain Present Now: No Electronic Signature(s) Signed: 03/22/2022 4:39:54 PM By: Erenest Blank Entered By: Erenest Blank on 03/22/2022 10:56:41 -------------------------------------------------------------------------------- Clinic Level of Care Assessment Details Patient Name: Date of Service: [place], [name] 03/22/2022 11:00 A M Medical Record Number: [id number] Patient Account Number: [id number] Date of Birth/Sex: Treating RN: 1940-11-23 (81 y.o. [name] Primary Care Taralyn Ferraiolo: [name] Other Clinician: Referring Latiqua Daloia: Treating Dearl Rudden/Extender: Azucena Kuba in Treatment: 22 Clinic Level of Care Assessment Items TOOL 4 Quantity Score X- 1 0 Use when only an EandM is performed on FOLLOW-UP  visit ASSESSMENTS - Nursing Assessment / Reassessment X- 1 10 Reassessment of Co-morbidities (includes updates in patient status) X- 1 5 Reassessment of Adherence to Treatment Plan ASSESSMENTS - Wound and Skin A ssessment / Reassessment X - Simple Wound Assessment / Reassessment - one wound 1 5 []  - 0 Complex Wound Assessment / Reassessment - multiple wounds []  - 0 Dermatologic / Skin Assessment (not related to wound area) ASSESSMENTS - Focused Assessment []  - 0 Circumferential Edema Measurements - multi extremities []  - 0 Nutritional Assessment / Counseling / Intervention []  - 0 Lower Extremity Assessment (monofilament, tuning fork, pulses) [name] Fernandez ([id number]) [id number] Page 2 of 8 []  - 0 Peripheral Arterial Disease Assessment (using hand held doppler) ASSESSMENTS - Ostomy and/or Continence Assessment and Care []  - 0 Incontinence Assessment and Management []  - 0 Ostomy Care Assessment and Management (repouching, etc.) PROCESS - Coordination of Care []  - 0 Simple Patient / Family Education for ongoing care X- 1 20 Complex (extensive) Patient / Family Education for ongoing care X- 1 10 Staff obtains Programmer, systems, Records, T Results / Process Orders est []  - 0 Staff telephones HHA, Nursing Homes / Clarify orders / etc []  - 0 Routine Transfer to another Facility (non-emergent condition) []  - 0 Routine Hospital Admission (non-emergent condition) []  - 0 New Admissions / Biomedical engineer / Ordering NPWT Apligraf, etc. , []  - 0 Emergency Hospital Admission (emergent condition) []  - 0 Simple Discharge Coordination []  - 0 Complex (extensive) Discharge Coordination PROCESS - Special Needs []  - 0 Pediatric / Minor Patient Management []  - 0 Isolation Patient Management []  - 0 Hearing / Language / Visual special needs []  - 0 Assessment of Community assistance (transportation, D/C planning, etc.) []  - 0 Additional assistance /  Altered mentation []  - 0 Support Surface(s) Assessment (bed, cushion, seat, etc.) INTERVENTIONS - Wound Cleansing / Measurement X - Simple Wound Cleansing - one wound 1 5 []  -  0 Complex Wound Cleansing - multiple wounds X- 1 5 Wound Imaging (photographs - any number of wounds) []  - 0 Wound Tracing (instead of photographs) X- 1 5 Simple Wound Measurement - one wound []  - 0 Complex Wound Measurement - multiple wounds INTERVENTIONS - Wound Dressings []  - 0 Small Wound Dressing one or multiple wounds X- 1 15 Medium Wound Dressing one or multiple wounds []  - 0 Large Wound Dressing one or multiple wounds []  - 0 Application of Medications - topical []  - 0 Application of Medications - injection INTERVENTIONS - Miscellaneous []  - 0 External ear exam []  - 0 Specimen Collection (cultures, biopsies, blood, body fluids, etc.) []  - 0 Specimen(s) / Culture(s) sent or taken to Lab for analysis []  - 0 Patient Transfer (multiple staff / Civil Service fast streamer / Similar devices) []  - 0 Simple Staple / Suture removal (25 or less) []  - 0 Complex Staple / Suture removal (26 or more) []  - 0 Hypo / Hyperglycemic Management (close monitor of Blood Glucose) []  - 0 Ankle / Brachial Index (ABI) - do not check if billed separately [name] Fernandez ([id number]) [id number] Page 3 of 8 X- 1 5 Vital Signs Has the patient been seen at the hospital within the last three years: Yes Total Score: 85 Level Of Care: New/Established - Level 3 Electronic Signature(s) Signed: 03/22/2022 5:41:25 PM By: [name] Entered By: [name] on 03/22/2022 11:54:30 -------------------------------------------------------------------------------- Encounter Discharge Information Details Patient Name: Date of Service: [name], [name] 03/22/2022 11:00 A M Medical Record Number: [id number] Patient Account Number: [id number] Date of Birth/Sex: Treating RN: [birth year] (81 y.o. [name] Primary Care Robbin Escher: [name] Other Clinician: Referring Muntaha Vermette: Treating Cherylene Ferrufino/Extender: Azucena Kuba in Treatment: 22 Encounter Discharge Information Items Discharge Condition: Stable Ambulatory Status: Ambulatory Discharge Destination: Home Transportation: Private Auto Schedule Follow-up Appointment: Yes Clinical Summary of Care: Provided on 03/22/2022 Form Type Recipient Paper Patient Patient Electronic Signature(s) Signed: 03/22/2022 5:41:25 PM By: [name] Entered By: [name] on 03/22/2022 11:55:17 -------------------------------------------------------------------------------- Lower Extremity Assessment Details Patient Name: Date of Service: [place], [name] 03/22/2022 11:00 A M Medical Record Number: [id number] Patient Account Number: [id number] Date of Birth/Sex: Treating RN: [birth year] (81 y.o. M) Primary Care Katrice Goel: [name] Other Clinician: Referring Meygan Kyser: Treating Nastasha Reising/Extender: Azucena Kuba in Treatment: 22 Edema Assessment Assessed: [Left: No] [Right: No] Edema: [Left: Ye] [Right: s] Calf Left: Right: Point of Measurement: 32 cm From Medial Instep 397 cm Ankle Left: Right: Point of Measurement: 10 cm From Medial Instep 27 cm Electronic Signature(s) DEMONTRAE, GILBERT ([id number]) [id number] Page 4 of 8 Signed: 03/22/2022 4:39:54 PM By: Erenest Blank Entered By: Erenest Blank on 03/22/2022 11:05:50 -------------------------------------------------------------------------------- Multi-Disciplinary Care Plan Details Patient Name: Date of Service: [place], [name] 03/22/2022 11:00 A M Medical Record Number: [id number] Patient Account Number: [id number] Date of Birth/Sex: Treating RN: February 11, 1941 (81 y.o. [name] Primary Care Ramia Sidney: [name] Other Clinician: Referring Avigail Pilling: Treating  Rocky Rishel/Extender: Azucena Kuba in Treatment: 22 Active Inactive Venous Leg Ulcer Nursing Diagnoses: Actual venous Insuffiency (use after diagnosis is confirmed) Goals: Patient will maintain optimal edema control Date Initiated: 10/19/2021 Target Resolution Date: 04/05/2022 Goal Status: Active Interventions: Assess peripheral edema status every visit. Compression as ordered Notes: 11/16/21: Edema not controlled, patient not compliant with compression. 12/14/21:Edema not well controlled, patient will not allow compression wraps. 02/15/22: Edema better  controlled, patient using ACE wrap Wound/Skin Impairment Nursing Diagnoses: Impaired tissue integrity Goals: Patient/caregiver will verbalize understanding of skin care regimen Date Initiated: 10/19/2021 Target Resolution Date: 04/05/2022 Goal Status: Active Ulcer/skin breakdown will have a volume reduction of 30% by week 4 Date Initiated: 10/19/2021 Date Inactivated: 12/14/2021 Target Resolution Date: 12/14/2021 Unmet Reason: infection, non Goal Status: Unmet compliance Interventions: Assess patient/caregiver ability to obtain necessary supplies Assess patient/caregiver ability to perform ulcer/skin care regimen upon admission and as needed Assess ulceration(s) every visit Provide education on ulcer and skin care Treatment Activities: Topical wound management initiated : 10/19/2021 Notes: 11/16/21: Wound care continues, patient not compliant with edema control. 02/15/22: Wound care regimen continues. Large co-pay for skin subs. Electronic Signature(s) Signed: 03/22/2022 11:23:42 AM By: [name] Entered By: [name] on 03/22/2022 11:23:41 [name] ([id number]) [id number] Page 5 of 8 -------------------------------------------------------------------------------- Pain Assessment Details Patient Name: Date of Service: [place], [name] 03/22/2022 11:00 A M Medical  Record Number: [id number] Patient Account Number: [id number] Date of Birth/Sex: Treating RN: Dec 11, 1940 (81 y.o. M) Primary Care Rodarius Kichline: [name] Other Clinician: Referring Ramin Zoll: Treating Imbler Antolin/Extender: Azucena Kuba in Treatment: 22 Active Problems Location of Pain Severity and Description of Pain Patient Has Paino Yes Site Locations Pain Location: Pain in Ulcers Rate the pain. Current Pain Level: 4 Character of Pain Describe the Pain: Burning, Sharp, Other: stinging Pain Management and Medication Current Pain Management: Electronic Signature(s) Signed: 03/22/2022 4:39:54 PM By: Erenest Blank Entered By: Erenest Blank on 03/22/2022 10:57:19 -------------------------------------------------------------------------------- Patient/Caregiver Education Details Patient Name: Date of Service: [place] [name] 10/11/2023andnbsp11:00 A M Medical Record Number: [id number] Patient Account Number: [id number] Date of Birth/Gender: Treating RN: [birth year] (81 y.o. [name] Primary Care Physician: [name] Other Clinician: Referring Physician: Treating Physician/Extender: Azucena Kuba in Treatment: 22 Education Assessment Education Provided To: Patient Education Topics Provided Malignant/Atypical Wounds: NEIMAN, ROOTS ([id number]) [id number] Page 6 of 8 Methods: Explain/Verbal, Printed Responses: State content correctly Venous: Methods: Explain/Verbal, Printed Responses: State content correctly Wound/Skin Impairment: Methods: Explain/Verbal, Printed Responses: State content correctly Electronic Signature(s) Signed: 03/22/2022 5:41:25 PM By: [name] Entered By: [name] on 03/22/2022 11:24:10 -------------------------------------------------------------------------------- Wound Assessment Details Patient Name: Date of Service: [name], [place] LD  C. 03/22/2022 11:00 A M Medical Record Number: [id number] Patient Account Number: [id number] Date of Birth/Sex: Treating RN: [birth year] (81 y.o. M) Primary Care Aliveah Gallant: [name] Other Clinician: Referring Margee Trentham: Treating Kristalynn Coddington/Extender: Azucena Kuba in Treatment: 22 Wound Status Wound Number: 6 Primary Vasculitis Etiology: Wound Location: Left, Lateral Foot Wound Open Wounding Event: Gradually Appeared Status: Date Acquired: 07/25/2021 Comorbid Anemia, Chronic Obstructive Pulmonary Disease (COPD), Weeks Of Treatment: 22 History: Hypertension, Peripheral Venous Disease, Type II Diabetes, Clustered Wound: No Osteoarthritis, Neuropathy Photos Wound Measurements Length: (cm) 6 Width: (cm) 3 Depth: (cm) 0.2 Area: (cm) 14.137 Volume: (cm) 2.827 % Reduction in Area: 27.4% % Reduction in Volume: 27.4% Epithelialization: Small (1-33%) Tunneling: No Undermining: No Wound Description Classification: Full Thickness Without Exposed Suppor Wound Margin: Distinct, outline attached Exudate Amount: Large Exudate Type: Serosanguineous Exudate Color: red, brown t Structures Foul Odor After Cleansing: No Slough/Fibrino Yes Wound Bed Granulation Amount: Large (67-100%) Exposed Structure Granulation Quality: Red, Pink, Hyper-granulation Fascia Exposed: No Necrotic Amount: Small (1-33%) Fat Layer (Subcutaneous Tissue) Exposed: Yes MAKYI, LEDO ([id number]) [id number] Page 7 of 8 Necrotic Quality: Adherent Slough Tendon Exposed: No Muscle Exposed:  No Joint Exposed: No Bone Exposed: No Periwound Skin Texture Texture Color No Abnormalities Noted: Yes No Abnormalities Noted: No Atrophie Blanche: No Moisture Cyanosis: No No Abnormalities Noted: Yes Ecchymosis: No Erythema: No Hemosiderin Staining: No Mottled: No Pallor: No Rubor: No Temperature / Pain Temperature: No Abnormality Treatment Notes Wound  #6 (Foot) Wound Laterality: Left, Lateral Cleanser Soap and Water Discharge Instruction: May shower and wash wound with dial antibacterial soap and water prior to dressing change. Peri-Wound Care Zinc Oxide Ointment 30g tube Discharge Instruction: Apply Zinc Oxide to periwound with each dressing change Topical Gentamicin Discharge Instruction: As directed by physician Primary Dressing Hydrofera Blue Ready Foam, 4x5 in Discharge Instruction: Apply to wound bed as instructed Secondary Dressing ABD Pad, 5x9 Discharge Instruction: Apply over primary dressing as directed. Secured With [employer] 4x5 (in/yd) Discharge Instruction: Secure with Coban as directed. Kerlix Roll Sterile, 4.5x3.1 (in/yd) Discharge Instruction: Secure with Kerlix as directed. 41M Medipore Soft Cloth Surgical T 2x10 (in/yd) ape Discharge Instruction: Secure with tape as directed. Compression Wrap ACE Wrap Discharge Instruction: For Compression Compression Stockings Add-Ons Electronic Signature(s) Signed: 03/22/2022 4:39:54 PM By: Erenest Blank Entered By: Erenest Blank on 03/22/2022 11:06:49 -------------------------------------------------------------------------------- Vitals Details Patient Name: Date of Service: [name], [name] 03/22/2022 11:00 A Koren Bound, Mickle Asper ([id number]) [id number] Page 8 of 8 Medical Record Number: [id number] Patient Account Number: [id number] Date of Birth/Sex: Treating RN: [birth year] (81 y.o. M) Primary Care Bryttani Blew: [name] Other Clinician: Referring Wane Mollett: Treating Liylah Najarro/Extender: Azucena Kuba in Treatment: 22 Vital Signs Time Taken: 10:57 Temperature (F): 97.8 Height (in): 66 Pulse (bpm): 64 Weight (lbs): 184 Respiratory Rate (breaths/min): 18 Body Mass Index (BMI): 29.7 Blood Pressure (mmHg): 147/79 Reference Range: 80 - 120 mg / dl Electronic Signature(s) Signed:  03/22/2022 4:39:54 PM By: Erenest Blank Entered By: Erenest Blank on 03/22/2022 10:57:55

## 2022-03-22 NOTE — Progress Notes (Addendum)
ELIEZER, KHAWAJA ([id number]) [id number] Page 1 of 10 Visit Report for 03/22/2022 Chief Complaint Document Details Patient Name: Date of Service: [place], [name] 03/22/2022 11:00 A M Medical Record Number: [id number] Patient Account Number: [id number] Date of Birth/Sex: Treating RN: 02-06-1941 (81 y.o. M) Primary Care Provider: [name] Other Clinician: Referring Provider: Treating Provider/Extender: Azucena Kuba in Treatment: 22 Information Obtained from: Patient Chief Complaint Bilateral foot ulcers Electronic Signature(s) Signed: 03/22/2022 11:16:17 AM By: [name] PA-C Entered By: [name] on 03/22/2022 11:16:17 -------------------------------------------------------------------------------- HPI Details Patient Name: Date of Service: [place], [name] 03/22/2022 11:00 A M Medical Record Number: [id number] Patient Account Number: [id number] Date of Birth/Sex: Treating RN: 09-26-1940 (81 y.o. M) Primary Care Provider: [name] Other Clinician: Referring Provider: Treating Provider/Extender: Azucena Kuba in Treatment: 50 History of Present Illness HPI Description: 81 year old gentleman known to have swelling of his left lower extremity with some ulceration along the medial ankle has been having these problems for the last 3-4 months and does not know how it came on. No history of injury or no history of any infection in this area. He is known to have diabetes mellitus controlled with diet, hyperlipidemia, hypertension and chronic lumbar back pain with sciatica which is being treated by his PCP. Last hemoglobin A1c was 6.3 in June 2017. Last medical history is significant for esophageal stricture, hiatal hernia, vitamin D deficiency, status post back surgery 3, hernia repair as a child for both groins, hiatal hernia repair, joint replacement and revision of a knee  surgery on the right side. He has quit smoking in 1982. He has never had a Doppler study of his lower extremity except remotely when he had knee surgery they looked for a blood clot on the right side. 09/13/2016 -- venous reflux study done on 09/12/2016 showed there is evidence of greater saphenous vein reflux in the left lower extremity and the small saphenous vein and the posterior thigh is not competent. 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 With these results he definitely needs a referral to the vascular surgeons 09/20/2016 -- he has an appointment to see the vascular surgeons on May 1 10/03/2016 -- patient had a 4 layer compression on his left lower extremity and had a lot of discomfort and pain. 10/11/2016-- was seen by Dr. Althea [name] -- review of his data he recommended staged laser ablation of his great and then small saphenous veins for reduction odd of his venous hypertension. He did examine his right leg with the SonoSite and he has dilatation of the saphenous vein on the right lower extremity too. Since there was no evidence of ulceration he recommended observation of the right lower extremity. He recommended to continue with local wound care at the wound center until his wound was completely healed. 10/18/2016 -- the patient has not been very compliant with his compression, his diet and his diuretics and as a result his lymphedema has increased markedly 10/25/2016 -- he has been compliant this week, has worn his 2 press compression wraps, taken his diuretics and is watching his salt intake. 11/08/2016 -- he had his venous procedure done last week and details of this have been reported and reviewed in his electronic medical record. he had a laser ablation of his great saphenous vein and this was done from mid calf to just below the saphenofemoral junction. He would return next  week for ultrasound follow-up. He will then undergo the small saphenous vein ablation in a  few weeks. 11/15/2016 -- his postprocedure visit showed good closure of his left great saphenous vein from the mid calf to 1-1/2 cm from the saphenofemoral junction. He had excellent early results from the ablation. The ablation of the left small saphenous vein was planned in a week 12/06/2016 -- he had his small saphenous vein venous ablation a week before and the duplex showed closure of his small saphenous vein to within half centimeter office saphenous popliteal junction with no DVT and he was pleased with the results. He recommended continue with elevation and compression and to see them back on an as-needed basis 12/19/16; the patient's wound is actually closed. Sometime over the weekend he developed a small abrasion on the lower calf just above the original wound on [place] ([id number]) [id number] Page 2 of 10 the left medial malleolus although this is closed as well Readmission: 01/18/18 on evaluation today patient presents after having last been seen in our clinic July 2018. Subsequently he is a reoccurrence of the ulcers on the left ankle both medial and now a new area lateral that had been present back room for about a month he tells me. He states that this has done very well for about a year he really does not know of any injury or anything that happened that would have caused this reopening at this point. He has continued to wear compression stockings which is appropriate. He does not have lymphedema pumps. He has continued to also keep the area clean and dry as best he could. With that being said now that is been reopened is been harder for him to take care of this as far as compression stockings are concerned keeping them clean along with continuing to manage his fluid and swelling. His ABI appears to be great on the left registering at 1.14 today. He has previously undergone a venous ablation. Other than compression/lymphedema pumps he's really done  everything that he can to help manage and prevent these ulcers from forming. 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 01/30/18 on evaluation today patient appears to actually be doing very well in regard to his medial ankle ulcer. It's the lateral ulcer that actually is not doing quite as well today. Fortunately he did have some alginate left ovary start using this on the wounds and the medial ankle actually seems to be doing much better. The lateral ankle does actually need something to con a help with slough and buildup. The collagen really did not seem to do much for him in that regard. 02/13/18 on evaluation today patient appears to be doing about the same in regard to his ulcers. Unfortunately I do not feel like were making good progress at this time. When I have debrided the wound the slough seems to come back fairly quickly in the lateral ankle ulcer. There does not appear to be any evidence of infection at this time. No fevers, chills, nausea, or vomiting noted at this time. 02/27/18 on evaluation today patient actually appears to be showing some signs of improvement in regard to both wound areas. I'm insurance on the medial aspect of his left lower extremity at the ankle whether or not the alginate may be sticking and causing some tearing off of new skin attempting to grow. With that being said the patient states it does not seem to be doing such just pulls up some of the dried dead skin  around. Nonetheless this is something I wanted to watch out for going forward and actually I recommended that he take the dressing off in the shower when you could get it completely wet before removal to see how this does. Subsequently in regard to the lateral ankle ulcer he still has slough noted at this point although I do believe that he is tolerating the Medihoney much better I wish you could have gotten the Santyl but unfortunately the Santyl was too expensive gonna cost him $250. 03/06/18 on evaluation today patient  appears to be doing about the same in regard to both ulcers of his left medial and lateral malleolus sites. He's been tolerating the dressing changes without complication. Unfortunately things do not seem to be improving in regard to either side at this time. Overall I think we may need to switch things up a little bit as far as the way we are performing the dressing changes currently. 03/27/18 on evaluation today patient's wound bed actually appears to be doing much better at both locations in regard to his ankle ulcers. He has been tolerating the dressing changes at this time without complication. Overall I'm very pleased with how things appear. The patient likewise states that he's much better in regard to discomfort at this time. 04/10/18 on evaluation today patient actually appears to be doing well in regard to his left lateral ankle ulcer. He has been tolerating the dressing changes without complication. Fortunately there does not appear to be any evidence of infection. Overall I do feel like he is tolerating the Medihoney without complication. 04/24/18 upon evaluation today patient's wound actually appears to be healed on the lateral aspect of his ankle the medial aspect still continues to remain close without any evidence of complication at this time. Overall I have been extremely pleased with how things have progressed up to this point. The patient likewise is very happy he's not having any significant pain this time. He does wears compression stocking on a regular basis. Readmission: 10-19-2021 upon evaluation today patient presents for evaluation here in the clinic concerning issues with a left lateral ulcer as well as a right lateral ulcer both along the aspect of his foot getting close to the ankle. This left area is the same place that I treated back in 2024. 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 This appears to be very inflamed and is also  very painful. In the past he has been completely unable to tolerate the use of any compression wraps unfortunately. There does not appear to be any evidence of active infection locally nor systemically at this time which is good news. I do believe however we need to try to see what we can do to calm down the inflammation I think triamcinolone topically as well as oral prednisone would likely be indicated in this case. Patient's medical history really has not changed significantly since I last saw him in actually 2019 though I think is stated 2020 above. 10-26-21 upon evaluation today patient appears to still be having a lot of irritation and inflammation in regard to the ankles laterally. The left is greater than the right. With that being said I do believe that he would benefit from a biopsy to confirm whether or not this may indeed be vasculitis. It actually seems like that physically and on examination but again I want to be sure that we are on the right track as far as treatment  is concerned. The good news is he did not have a DVT I called him about that on Friday this was all in regard to the right leg. Overall I am very pleased in that regard. 11-02-2021 upon evaluation today patient appears to be doing poorly still in regard to his left lower extremity. Apparently he had a fever on Sunday which was around 101 and he was severely sick feeling and disoriented according to his wife. She came with him today because she did not think that he would actually tell me what was going on. With that being said unfortunately he did not go to the hospital he actually has been doing a little better over the past few days he been taking amoxicillin he has not taken any other medications orally at this point that are different. He has not been on any antibiotics either more recently other than the amoxicillin. Nonetheless he has not had a repeat in the past 3 days of that issue. 11-09-2021 upon evaluation today  patient presents after having had quite an ordeal over the past several days. Subsequently yesterday I got a call from [facility] here in [place] concerning the patient and the fact that he was having bleeding that been going on for the past 24 hours and was not stopping. Subsequently my advice was to have the patient go to the ER for further evaluation and treatment. It seems to me that he had a regular way part of the wound down to the point that a blood vessel and open. Apparently he was filling up trash bags wrapped around his foot with blood. In fact his lab review which I did do him as well today showed that he went from a white hemoglobin on 09-14-2021 of 13.6 down to a hemoglobin of 11.2 on 11-08-2021 this was yesterday. Subsequently this is due to the amount of blood loss that he has had acutely. Again I do not believe he is at transfusion stage but at the same time he is extremely weak and states that a couple times when he is going to stand up he is actually fallen back into his chair. Nonetheless I do believe that he probably needs to supplement with iron and I did recommend that there are some over-the-counter products he could get in this regard. Subsequently also suggested that the patient needs to be drinking plenty of water he apparently drinks Coke and he drinks coffee but that is about it. Subsequently following this conversation his wife was present during this time as well and I do think that he is going to try to drink more I think it is of utmost importance. With that being said I did review his PCR culture as well it was positive for 2 organisms. This was Pseudomonas and Enterobacter. Both were showing up as being very prevalent in the PCR culture and subsequently Cipro will take care of the situation which is what I am recommending at this point based on what we see. This will take the place of the Bactrim and can have him discontinue the Bactrim the only thing is he is getting need to  stop taking the hydroxyzine if he has been taking it that is the Vistaril and he tells me that he only takes this when he itches he has not been taking it recently. 6/7; patient arrives in clinic today with the wound on the right foot actually looking some better the area on the left lateral looking about the same. Problematically, he is  having edema fluid leakage on the medial part of the left foot and ankle causing skin breakdown but no open wound. He has been very resistant to the notion of compression wraps. He has been using Santyl on both wound areas at home changing the dressing himself Partway through our visit today it became clear he had had a fall on Sunday 3 days ago. Since then he has had a lot of problems with his right shoulder 11-23-2021 upon evaluation today patient appears to be doing well with regard to his wound. Has been tolerating dressing changes without complication. Fortunately there does not appear to be any signs of infection he was wrapped last week when Dr. Dellia Nims saw him on the left. He was having some breakdown medially which they were concerned about. He did keep the wrap on until Monday but states it got extremely wet. For that reason I am thinking of doing a nurse visit on Friday so that we can get some of the swelling down right now by Friday we should have a lot of that out that we can switch out and put on a fresh dressing to get him through till next Wednesday. He is in agreement with giving that a try. JAYTHAN, HINELY ([id number]) [id number] Page 3 of 10 11-30-2021 upon evaluation today patient actually appears to be doing better with regard to the overall appearance of his wound. Fortunately there does not appear to be any signs of active infection locally or systemically which is great news. With that being said he has been tolerating the dressing changes without complication other than the fact that from Wednesday to Friday he did well  with the compression wrap from Friday till yesterday he tells me the drainage was so bad and the smell was so bad he had to take this off. It does appear that he is very macerated around the lower portion down around the heel and I do think that this is an issue. Unfortunately we cannot get home health to help with this currently. 12-07-2021 upon evaluation today patient appears to be doing well currently in regard to his wound. He is actually showing signs of excellent improvement which is great news. I am very pleased with where things stand. I do think that the progress is being made. With that being said the patient is tolerating the Santyl quite well and I think is doing an awesome job for him. 12-14-2021 upon evaluation today patient appears to be doing well with regard to his wounds he is making progress is still draining a lot however which I completely understand. With that being said fortunately there does not appear to be any signs of active infection locally or systemically at this time. 12-21-2021 upon evaluation today patient appears to be doing well currently in regard to his wound. He is actually showing signs of significant improvement this is slow but nonetheless week by week we are seeing this improve on the lateral aspect of his foot. The measurements are little bit better but more importantly the wound surface is actually a whole lot cleaner. The patient is pleased to hear this. With that being said he tells me that it is really hard for him to see exactly what all is going on. Nonetheless I am very pleased with all the new red tissue that were seeing granulating and hopefully we get this clean shortly and we can even switch to a different dressing to try to get it to close. 12-28-2021 upon evaluation today  patient appears to be doing well currently in regard to his wound is actually showing signs of improvement we are going to perform some debridement today to clearway some of the  necrotic debris. Overall I think that we are at the point where we do not have to use Santyl over the home wound I think using it just on the upper portion may be appropriate if we can get this completely cleaned away. 7/6; substantial wound on the left lateral foot. Using Santyl and Hydrofera Blue there is been good improvement since the last time I saw this. The patient does not tolerate compression wraps therefore he is using Ace wraps. Fortunately he appears to be doing this fairly well 01-11-22 upon evaluation today patient appears to be doing better in regard to his wound in general though he is having some issues here with blue-green drainage. I am going to go ahead and see about getting him sent in a prescription for gentamicin cream. 01-18-2022 upon evaluation today patient actually appears to be doing quite well in regards to his wound there green drainage we will previously done with has improved. Fortunately I do not see any evidence of active infection locally or systemically which is great news. No fever chills noted 02-01-2022 upon evaluation today patient appears to be doing well with regard to his wound. Has been tolerating the dressing changes without complication. Fortunately I see no signs of active infection locally or systemically at this time which is great news. 02-15-2022 upon evaluation today patient appears to be doing excellent in regard to his wound he is actually showing signs of good improvement. This wound is looking much better and in fact were getting close to not even needing the Santyl any longer. Overall though I think is still good to be helpful with the more proximal portion of the wound. 03-01-2022 upon evaluation today patient appears to be doing well with regard to his ulcer although there is some irritation in the skin around. I do believe this is in a large part due to drainage and I discussed that with him today. I do think that we need to try to see what we can do  to try to prevent this from being an ongoing issue going forward. 03-15-2022 upon evaluation today patient unfortunately is having increased discomfort and pain. It seems like this is likely due to infection based on what I am seeing the wound is also measuring a little bit larger than what it has been in the past. Unfortunately I am seeing some signs of the patient likely has been having increased drainage she also has the [facility] ready not the ready transfer which also does not allow anything to drain completely through to an outside dressing I think this can be a little bit of an issue as well. 03-22-2022 upon evaluation today patient's foot actually seems to be doing significantly better which is great news. Fortunately I do not see any signs of active infection locally or systemically which is great news and overall I am extremely pleased with where things stand currently. Electronic Signature(s) Signed: 03/22/2022 1:52:35 PM By: [name] PA-C Entered By: [name] on 03/22/2022 13:52:35 -------------------------------------------------------------------------------- Physical Exam Details Patient Name: Date of Service: [place], [name] 03/22/2022 11:00 A M Medical Record Number: [id number] Patient Account Number: [id number] Date of Birth/Sex: Treating RN: 22-Sep-1940 (81 y.o. M) Primary Care Provider: [name] Other Clinician: Referring Provider: Treating Provider/Extender: Lebron Quam  D Weeks in Treatment: 27 Constitutional Well-nourished and well-hydrated in no acute distress. Respiratory normal breathing without difficulty. Psychiatric this patient is able to make decisions and demonstrates good insight into disease process. Alert and Oriented x 3. pleasant and cooperative. Notes JABRON, WEESE ([id number]) [id number] Page 4 of 10 Upon inspection patient's wound did not require any sharp debridement  it actually appears to be less erythematous compared to what we have seen previous. Nonetheless I do believe that he is getting need to continue with appropriate monitoring as far as the infection is concerned he does have still 3 weeks worth of medication remaining and it seems to be doing a great job for him and very pleased in that regard. Electronic Signature(s) Signed: 03/22/2022 1:53:00 PM By: [name] PA-C Entered By: [name] on 03/22/2022 13:53:00 -------------------------------------------------------------------------------- Physician Orders Details Patient Name: Date of Service: [place], [name] 03/22/2022 11:00 A M Medical Record Number: [id number] Patient Account Number: [id number] Date of Birth/Sex: Treating RN: 1941/03/06 (81 y.o. 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 - with Bernette Redbird, Room 7) Return A Other: - Continue taking Levaquin Prism:Supplies Anesthetic (In clinic) Topical Lidocaine 5% applied to wound bed (In clinic) Topical Lidocaine 4% applied to wound bed Cellular or Tissue Based Products Cellular or Tissue Based Product Type: - Will run IVR for Apligraf= $295 [employer], $3400 out of pocket with $1440 met. Bathing/ Shower/ Hygiene May shower and wash wound with soap and  water. - wash with antibacterial soap when changing dressing Edema Control - Lymphedema / SCD / Other Elevate legs to the level of the heart or above for 30 minutes daily and/or when sitting, a frequency of: - throughout the day Patient to wear own compression stockings every day. - Use stocking daily to right leg Moisturize legs daily. - Eucerin (in jar) Additional Orders / Instructions Follow Nutritious Diet Wound Treatment Wound #6 - Foot Wound Laterality: Left, Lateral Cleanser: Soap and Water Every Other Day/30 Days Discharge Instructions: May shower and wash wound with dial antibacterial soap and water prior to dressing change. Peri-Wound Care: Zinc Oxide Ointment 30g tube Every Other Day/30 Days Discharge Instructions: Apply Zinc Oxide to periwound with each dressing change Topical: Gentamicin Every Other Day/30 Days Discharge Instructions: As directed by physician Prim Dressing: Hydrofera Blue Ready Foam, 4x5 in Every Other Day/30 Days ary Discharge Instructions: Apply to wound bed as instructed Secondary Dressing: ABD Pad, 5x9 (Generic) Every Other Day/30 Days Discharge Instructions: Apply over primary dressing as directed. MELITON, SAMAD ([id number]) [id number] Page 5 of 10 Secured With: Coban Self-Adherent Wrap 4x5 (in/yd) Every Other Day/30 Days Discharge Instructions: Secure with Coban as directed. Secured With: [employer], 4.5x3.1 (in/yd) (Generic) Every Other Day/30 Days Discharge Instructions: Secure with Kerlix as directed. Secured With: 33M Medipore Public affairs consultant Surgical T 2x10 (in/yd) (Generic) Every Other Day/30 Days ape Discharge Instructions: Secure with tape as directed. Compression  Wrap: ACE Wrap Every Other Day/30 Days Discharge Instructions: For Compression Electronic Signature(s) Signed: 03/22/2022 5:12:24 PM By: [name] PA-C Signed: 03/22/2022 5:41:25 PM By: [name] Entered By: [name] on 03/22/2022  11:53:39 -------------------------------------------------------------------------------- Problem List Details Patient Name: Date of Service: [place], [name] 03/22/2022 11:00 A M Medical Record Number: [id number] Patient Account Number: [id number] Date of Birth/Sex: Treating RN: [birth year] (81 y.o. 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 10/19/2021 No Yes L97.522 Non-pressure chronic ulcer of other part of left foot with fat layer exposed 10/19/2021 No Yes L97.512 Non-pressure chronic ulcer of other part of right foot with fat layer exposed 10/19/2021 No Yes E11.622 Type 2 diabetes mellitus with other skin ulcer 10/19/2021 No Yes Inactive Problems Resolved Problems Electronic Signature(s) Signed: 03/22/2022 11:16:01 AM By: Curtis Sites, Mickle Asper ([id number]) [id number] Page 6 of 10 Entered By: [name] on 03/22/2022 11:16:01 -------------------------------------------------------------------------------- Progress Note Details Patient Name: Date of Service: [place], [name] 03/22/2022 11:00 A M Medical Record Number: [id number] Patient Account Number: [id number] Date of Birth/Sex: Treating RN: 29-Aug-1940 (81 y.o. M) Primary Care Provider: [name] Other Clinician: Referring Provider: Treating Provider/Extender: Azucena Kuba in Treatment: 22 Subjective Chief Complaint Information obtained from Patient Bilateral foot ulcers History of Present Illness (HPI) 81 year old  gentleman known to have swelling of his left lower extremity with some ulceration along the medial ankle has been having these problems for the last 3-4 months and does not know how it came on. No history of injury or no history of any infection in this area. He is known to have diabetes mellitus controlled with diet, hyperlipidemia, hypertension and chronic lumbar back pain with sciatica which is being treated by his PCP. Last hemoglobin A1c was 6.3 in June 2017. Last medical history is significant for esophageal stricture, hiatal hernia, vitamin D deficiency, status post back surgery o3, hernia repair as a child for both groins, hiatal hernia repair, joint replacement and revision of a knee surgery on the right side. He has quit smoking in 1982. He has never had a Doppler study of his lower extremity except remotely when he had knee surgery they looked for a blood clot on the right side. 09/13/2016 -- venous reflux study done on 09/12/2016 showed there is evidence of greater saphenous vein reflux in the left lower extremity and the small saphenous vein and the posterior thigh is not competent. 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 With these results he definitely needs a referral to the vascular surgeons 09/20/2016 -- he has an appointment to see the vascular surgeons on May 1 10/03/2016 -- patient had a 4 layer compression on his left lower extremity and had a lot of discomfort and pain. 10/11/2016-- was seen by Dr. Althea [name] -- review of his data he recommended staged laser ablation of his great and then small saphenous veins for reduction odd of his venous hypertension. He did examine his right leg with the SonoSite and he has dilatation of the saphenous vein on the right lower extremity too. Since there was no evidence of  ulceration he recommended observation of the right lower extremity. He recommended to continue with local wound care at the wound center until his wound  was completely healed. 10/18/2016 -- the patient has not been very compliant with his compression, his diet and his diuretics and as a result his lymphedema has increased markedly 10/25/2016 -- he has been compliant this week, has worn his 2 press compression wraps, taken his diuretics and is watching his salt intake. 11/08/2016 -- he had his venous procedure done last week and details of this have been reported and reviewed in his electronic medical record. he had a laser ablation of his great saphenous vein and this was done from mid calf to just below the saphenofemoral junction. He would return next week for ultrasound follow-up. He will then undergo the small saphenous vein ablation in a few weeks. 11/15/2016 -- his postprocedure visit showed good closure of his left great saphenous vein from the mid calf to 1-1/2 cm from the saphenofemoral junction. He had excellent early results from the ablation. The ablation of the left small saphenous vein was planned in a week 12/06/2016 -- he had his small saphenous vein venous ablation a week before and the duplex showed closure of his small saphenous vein to within half centimeter office saphenous popliteal junction with no DVT and he was pleased with the results. He recommended continue with elevation and compression and to see them back on an as-needed basis 12/19/16; the patient's wound is actually closed. Sometime over the weekend he developed a small abrasion on the lower calf just above the original wound on the left medial malleolus although this is closed as well Readmission: 01/18/18 on evaluation today patient presents after having last been seen in our clinic July 2018. Subsequently he is a reoccurrence of the ulcers on the left ankle both medial and now a new area lateral that had been present back room for about a month he tells me. He states that this has done very well for about a year he really does not know of any injury or anything that  happened that would have caused this reopening at this point. He has continued to wear compression stockings which is appropriate. He does not have lymphedema pumps. He has continued to also keep the area clean and dry as best he could. With that being said now that is been reopened is been harder for him to take care of this as far as compression stockings are concerned keeping them clean along with continuing to manage his fluid and swelling. His ABI appears to be great on the left registering at 1.14 today. He has previously undergone a venous ablation. Other than compression/lymphedema pumps he's really done everything that he can to help manage and prevent these ulcers from forming. 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 01/30/18 on evaluation today patient appears to actually be doing very well in regard to his medial ankle ulcer. It's the lateral ulcer that actually is not doing quite as well today. Fortunately he did have some alginate left ovary start using this on the wounds and the medial ankle actually seems to be doing much better. The lateral ankle does actually need something to con a help with slough and buildup. The collagen really did not seem to do much for him in that regard. 02/13/18 on evaluation today patient appears to be doing about the same in regard to his ulcers. Unfortunately I do not feel like were making good progress at this time.  When I have debrided the wound the slough seems to come back fairly quickly in the lateral ankle ulcer. There does not appear to be any evidence of infection at this time. No fevers, chills, nausea, or vomiting noted at this time. 02/27/18 on evaluation today patient actually appears to be showing some signs of improvement in regard to both wound areas. I'm insurance on the medial aspect of his left lower extremity at the ankle whether or not the alginate may be sticking and causing some tearing off of new skin attempting to grow. With that being said  the patient states it does not seem to be doing such just pulls up some of the dried dead skin around. Nonetheless this is something I wanted to watch out for going forward and actually I recommended that he take the dressing off in the shower when you could get it completely wet before removal to see how this does. Subsequently in regard to the lateral ankle ulcer he still has slough noted at this point although I do believe that he is tolerating the Medihoney much better I wish you could have gotten the Santyl but unfortunately the Santyl was too expensive gonna cost him $250. 03/06/18 on evaluation today patient appears to be doing about the same in regard to both ulcers of his left medial and lateral malleolus sites. He's been tolerating the dressing changes without complication. Unfortunately things do not seem to be improving in regard to either side at this time. Overall I think we ADLEY, MAZUROWSKI ([id number]) [id number] Page 7 of 10 may need to switch things up a little bit as far as the way we are performing the dressing changes currently. 03/27/18 on evaluation today patient's wound bed actually appears to be doing much better at both locations in regard to his ankle ulcers. He has been tolerating the dressing changes at this time without complication. Overall I'm very pleased with how things appear. The patient likewise states that he's much better in regard to discomfort at this time. 04/10/18 on evaluation today patient actually appears to be doing well in regard to his left lateral ankle ulcer. He has been tolerating the dressing changes without complication. Fortunately there does not appear to be any evidence of infection. Overall I do feel like he is tolerating the Medihoney without complication. 04/24/18 upon evaluation today patient's wound actually appears to be healed on the lateral aspect of his ankle the medial aspect still continues to remain  close without any evidence of complication at this time. Overall I have been extremely pleased with how things have progressed up to this point. The patient likewise is very happy he's not having any significant pain this time. He does wears compression stocking on a regular basis. Readmission: 10-19-2021 upon evaluation today patient presents for evaluation here in the clinic concerning issues with a left lateral ulcer as well as a right lateral ulcer both along the aspect of his foot getting close to the ankle. This left area is the same place that I treated back in 2024. 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 This appears to be very inflamed and is also very painful. In the past he has been completely unable to tolerate the use of any compression wraps unfortunately. There does not appear to be any evidence of active infection locally nor systemically at this time which is good news. I do believe however we need to try to see  what we can do to calm down the inflammation I think triamcinolone topically as well as oral prednisone would likely be indicated in this case. Patient's medical history really has not changed significantly since I last saw him in actually 2019 though I think is stated 2020 above. 10-26-21 upon evaluation today patient appears to still be having a lot of irritation and inflammation in regard to the ankles laterally. The left is greater than the right. With that being said I do believe that he would benefit from a biopsy to confirm whether or not this may indeed be vasculitis. It actually seems like that physically and on examination but again I want to be sure that we are on the right track as far as treatment is concerned. The good news is he did not have a DVT I called him about that on Friday this was all in regard to the right leg. Overall I am very pleased in that regard. 11-02-2021 upon evaluation today patient appears to be  doing poorly still in regard to his left lower extremity. Apparently he had a fever on Sunday which was around 101 and he was severely sick feeling and disoriented according to his wife. She came with him today because she did not think that he would actually tell me what was going on. With that being said unfortunately he did not go to the hospital he actually has been doing a little better over the past few days he been taking amoxicillin he has not taken any other medications orally at this point that are different. He has not been on any antibiotics either more recently other than the amoxicillin. Nonetheless he has not had a repeat in the past 3 days of that issue. 11-09-2021 upon evaluation today patient presents after having had quite an ordeal over the past several days. Subsequently yesterday I got a call from [facility] here in [place] concerning the patient and the fact that he was having bleeding that been going on for the past 24 hours and was not stopping. Subsequently my advice was to have the patient go to the ER for further evaluation and treatment. It seems to me that he had a regular way part of the wound down to the point that a blood vessel and open. Apparently he was filling up trash bags wrapped around his foot with blood. In fact his lab review which I did do him as well today showed that he went from a white hemoglobin on 09-14-2021 of 13.6 down to a hemoglobin of 11.2 on 11-08-2021 this was yesterday. Subsequently this is due to the amount of blood loss that he has had acutely. Again I do not believe he is at transfusion stage but at the same time he is extremely weak and states that a couple times when he is going to stand up he is actually fallen back into his chair. Nonetheless I do believe that he probably needs to supplement with iron and I did recommend that there are some over-the-counter products he could get in this regard. Subsequently also suggested that the patient needs  to be drinking plenty of water he apparently drinks Coke and he drinks coffee but that is about it. Subsequently following this conversation his wife was present during this time as well and I do think that he is going to try to drink more I think it is of utmost importance. With that being said I did review his PCR culture as well it was positive for 2  organisms. This was Pseudomonas and Enterobacter. Both were showing up as being very prevalent in the PCR culture and subsequently Cipro will take care of the situation which is what I am recommending at this point based on what we see. This will take the place of the Bactrim and can have him discontinue the Bactrim the only thing is he is getting need to stop taking the hydroxyzine if he has been taking it that is the Vistaril and he tells me that he only takes this when he itches he has not been taking it recently. 6/7; patient arrives in clinic today with the wound on the right foot actually looking some better the area on the left lateral looking about the same. Problematically, he is having edema fluid leakage on the medial part of the left foot and ankle causing skin breakdown but no open wound. He has been very resistant to the notion of compression wraps. He has been using Santyl on both wound areas at home changing the dressing himself Partway through our visit today it became clear he had had a fall on Sunday 3 days ago. Since then he has had a lot of problems with his right shoulder 11-23-2021 upon evaluation today patient appears to be doing well with regard to his wound. Has been tolerating dressing changes without complication. Fortunately there does not appear to be any signs of infection he was wrapped last week when Dr. Dellia Nims saw him on the left. He was having some breakdown medially which they were concerned about. He did keep the wrap on until Monday but states it got extremely wet. For that reason I am thinking of doing a nurse visit  on Friday so that we can get some of the swelling down right now by Friday we should have a lot of that out that we can switch out and put on a fresh dressing to get him through till next Wednesday. He is in agreement with giving that a try. 11-30-2021 upon evaluation today patient actually appears to be doing better with regard to the overall appearance of his wound. Fortunately there does not appear to be any signs of active infection locally or systemically which is great news. With that being said he has been tolerating the dressing changes without complication other than the fact that from Wednesday to Friday he did well with the compression wrap from Friday till yesterday he tells me the drainage was so bad and the smell was so bad he had to take this off. It does appear that he is very macerated around the lower portion down around the heel and I do think that this is an issue. Unfortunately we cannot get home health to help with this currently. 12-07-2021 upon evaluation today patient appears to be doing well currently in regard to his wound. He is actually showing signs of excellent improvement which is great news. I am very pleased with where things stand. I do think that the progress is being made. With that being said the patient is tolerating the Santyl quite well and I think is doing an awesome job for him. 12-14-2021 upon evaluation today patient appears to be doing well with regard to his wounds he is making progress is still draining a lot however which I completely understand. With that being said fortunately there does not appear to be any signs of active infection locally or systemically at this time. 12-21-2021 upon evaluation today patient appears to be doing well currently in regard to  his wound. He is actually showing signs of significant improvement this is slow but nonetheless week by week we are seeing this improve on the lateral aspect of his foot. The measurements are little bit  better but more importantly the wound surface is actually a whole lot cleaner. The patient is pleased to hear this. With that being said he tells me that it is really hard for him to see exactly what all is going on. Nonetheless I am very pleased with all the new red tissue that were seeing granulating and hopefully we get this clean shortly and we can even switch to a different dressing to try to get it to close. 12-28-2021 upon evaluation today patient appears to be doing well currently in regard to his wound is actually showing signs of improvement we are going to perform some debridement today to clearway some of the necrotic debris. Overall I think that we are at the point where we do not have to use Santyl over the home wound I think using it just on the upper portion may be appropriate if we can get this completely cleaned away. 7/6; substantial wound on the left lateral foot. Using Santyl and Hydrofera Blue there is been good improvement since the last time I saw this. The patient does not tolerate compression wraps therefore he is using Ace wraps. Fortunately he appears to be doing this fairly well 01-11-22 upon evaluation today patient appears to be doing better in regard to his wound in general though he is having some issues here with blue-green drainage. I am going to go ahead and see about getting him sent in a prescription for gentamicin cream. RAYLEN, TANGONAN ([id number]) [id number] Page 8 of 10 01-18-2022 upon evaluation today patient actually appears to be doing quite well in regards to his wound there green drainage we will previously done with has improved. Fortunately I do not see any evidence of active infection locally or systemically which is great news. No fever chills noted 02-01-2022 upon evaluation today patient appears to be doing well with regard to his wound. Has been tolerating the dressing changes without complication. Fortunately I see no signs  of active infection locally or systemically at this time which is great news. 02-15-2022 upon evaluation today patient appears to be doing excellent in regard to his wound he is actually showing signs of good improvement. This wound is looking much better and in fact were getting close to not even needing the Santyl any longer. Overall though I think is still good to be helpful with the more proximal portion of the wound. 03-01-2022 upon evaluation today patient appears to be doing well with regard to his ulcer although there is some irritation in the skin around. I do believe this is in a large part due to drainage and I discussed that with him today. I do think that we need to try to see what we can do to try to prevent this from being an ongoing issue going forward. 03-15-2022 upon evaluation today patient unfortunately is having increased discomfort and pain. It seems like this is likely due to infection based on what I am seeing the wound is also measuring a little bit larger than what it has been in the past. Unfortunately I am seeing some signs of the patient likely has been having increased drainage she also has the [facility] ready not the ready transfer which also does not allow anything to drain completely through to an outside dressing I  think this can be a little bit of an issue as well. 03-22-2022 upon evaluation today patient's foot actually seems to be doing significantly better which is great news. Fortunately I do not see any signs of active infection locally or systemically which is great news and overall I am extremely pleased with where things stand currently. Objective Constitutional Well-nourished and well-hydrated in no acute distress. Vitals Time Taken: 10:57 AM, Height: 66 in, Weight: 184 lbs, BMI: 29.7, Temperature: 97.8 F, Pulse: 64 bpm, Respiratory Rate: 18 breaths/min, Blood Pressure: 147/79 mmHg. Respiratory normal breathing without difficulty. Psychiatric this  patient is able to make decisions and demonstrates good insight into disease process. Alert and Oriented x 3. pleasant and cooperative. General Notes: Upon inspection patient's wound did not require any sharp debridement it actually appears to be less erythematous compared to what we have seen previous. Nonetheless I do believe that he is getting need to continue with appropriate monitoring as far as the infection is concerned he does have still 3 weeks worth of medication remaining and it seems to be doing a great job for him and very pleased in that regard. Integumentary (Hair, Skin) Wound #6 status is Open. Original cause of wound was Gradually Appeared. The date acquired was: 07/25/2021. The wound has been in treatment 22 weeks. The wound is located on the Left,Lateral Foot. The wound measures 6cm length x 3cm width x 0.2cm depth; 14.137cm^2 area and 2.827cm^3 volume. There is Fat Layer (Subcutaneous Tissue) exposed. There is no tunneling or undermining noted. There is a large amount of serosanguineous drainage noted. The wound margin is distinct with the outline attached to the wound base. There is large (67-100%) red, pink, hyper - granulation within the wound bed. There is a small (1- 33%) amount of necrotic tissue within the wound bed including Adherent Slough. The periwound skin appearance had no abnormalities noted for texture. The periwound skin appearance had no abnormalities noted for moisture. The periwound skin appearance did not exhibit: Atrophie Blanche, Cyanosis, Ecchymosis, Hemosiderin Staining, Mottled, Pallor, Rubor, Erythema. Periwound temperature was noted as No Abnormality. 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 - with Bernette Redbird, Room 7) Other: - Continue taking Levaquin Prism:Supplies Anesthetic: (In clinic) Topical Lidocaine 5% applied to wound bed (In clinic) Topical Lidocaine 4% applied to wound bed Cellular or Tissue Based Products: MARSALIS, BEAULIEU ([id number]) [id number] Page 9 of 10 Cellular or Tissue Based Product Type: - Will run IVR for Apligraf= $295 [employer], $3400 out of pocket with $1440 met. Bathing/ Shower/ Hygiene: May shower and wash wound with soap and water. - wash with antibacterial soap when changing dressing Edema Control - Lymphedema / SCD / Other: Elevate legs to the level of the heart or above for 30 minutes daily and/or when sitting, a frequency of: - throughout the day Patient to wear own compression stockings every day. - Use stocking daily to right leg Moisturize legs daily. - Eucerin (in jar) Additional Orders / Instructions: Follow Nutritious Diet WOUND #6: - Foot Wound Laterality: Left, Lateral Cleanser: Soap and Water Every Other Day/30 Days Discharge Instructions: May shower and wash wound with dial antibacterial  soap and water prior to dressing change. Peri-Wound Care: Zinc Oxide Ointment 30g tube Every Other Day/30 Days Discharge Instructions: Apply Zinc Oxide to periwound with each dressing change Topical: Gentamicin Every Other Day/30 Days Discharge Instructions: As directed by physician Prim Dressing: Hydrofera Blue Ready Foam, 4x5 in Every Other Day/30 Days ary Discharge Instructions: Apply to wound bed as instructed Secondary Dressing: ABD Pad, 5x9 (Generic) Every Other Day/30 Days Discharge Instructions: Apply over primary dressing as directed. Secured With: Coban Self-Adherent Wrap 4x5 (in/yd) Every Other Day/30 Days Discharge Instructions: Secure with Coban as  directed. Secured With: [employer], 4.5x3.1 (in/yd) (Generic) Every Other Day/30 Days Discharge Instructions: Secure with Kerlix as directed. Secured With: 82M Medipore Public affairs consultant Surgical T 2x10 (in/yd) (Generic) Every Other Day/30 Days ape Discharge Instructions: Secure with tape as directed. Com pression Wrap: ACE Wrap Every Other Day/30 Days Discharge Instructions: For Compression 1. I would recommend that we have the patient going continue to monitor for any signs of worsening or infection. Obviously if anything changes he knows he should contact the office and let me know. 2. I am also can recommend that we have him continue specifically with the gentamicin followed by the [facility] and then he is using ABD pad and roll gauze to secure in place. We will see patient back for reevaluation in 2 weeks here in the clinic. If anything worsens or changes patient will contact our office for additional recommendations. Electronic Signature(s) Signed: 03/22/2022 1:53:29 PM By: [name] PA-C Entered By: [name] on 03/22/2022 13:53:29 -------------------------------------------------------------------------------- SuperBill Details Patient Name: Date of Service: [place], [name] 03/22/2022 Medical Record Number: [id number] Patient Account Number: [id number] Date of Birth/Sex: Treating RN: Nov 29, 1940 (81 y.o. 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 L97.522 Non-pressure chronic ulcer of other part of left foot with fat layer exposed L97.512 Non-pressure chronic ulcer of other part of  right foot with fat layer exposed E11.622 Type 2 diabetes mellitus with other skin ulcer Facility Procedures : CPT4 Code: [id number] Description: [place] VISIT-LEV 3 EST PT Modifier: Quantity: 1 Physician Procedures PAUL, TRETTIN ([id number]): CPT4 Code Description [id number] - WC PHYS LEVEL 3 - EST PT ICD-10 Diagnosis Description I87.333 Chronic venous hypertension (idiopathic) with ulcer and inflammat I89.0 Lymphedema, not elsewhere classified L95.8 Other  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

## 2022-03-29 ENCOUNTER — Encounter (HOSPITAL_BASED_OUTPATIENT_CLINIC_OR_DEPARTMENT_OTHER): Payer: Medicare HMO | Admitting: Internal Medicine

## 2022-04-04 ENCOUNTER — Ambulatory Visit (INDEPENDENT_AMBULATORY_CARE_PROVIDER_SITE_OTHER): Payer: Medicare HMO | Admitting: Internal Medicine

## 2022-04-04 ENCOUNTER — Encounter: Payer: Self-pay | Admitting: Internal Medicine

## 2022-04-04 VITALS — BP 140/78 | HR 70 | Temp 97.5°F | Resp 16 | Ht 65.0 in | Wt 180.2 lb

## 2022-04-04 DIAGNOSIS — E559 Vitamin D deficiency, unspecified: Secondary | ICD-10-CM | POA: Diagnosis not present

## 2022-04-04 DIAGNOSIS — Z1211 Encounter for screening for malignant neoplasm of colon: Secondary | ICD-10-CM

## 2022-04-04 DIAGNOSIS — Z136 Encounter for screening for cardiovascular disorders: Secondary | ICD-10-CM

## 2022-04-04 DIAGNOSIS — E785 Hyperlipidemia, unspecified: Secondary | ICD-10-CM | POA: Diagnosis not present

## 2022-04-04 DIAGNOSIS — N182 Chronic kidney disease, stage 2 (mild): Secondary | ICD-10-CM | POA: Diagnosis not present

## 2022-04-04 DIAGNOSIS — Z23 Encounter for immunization: Secondary | ICD-10-CM | POA: Diagnosis not present

## 2022-04-04 DIAGNOSIS — F419 Anxiety disorder, unspecified: Secondary | ICD-10-CM

## 2022-04-04 DIAGNOSIS — J449 Chronic obstructive pulmonary disease, unspecified: Secondary | ICD-10-CM

## 2022-04-04 DIAGNOSIS — Z0001 Encounter for general adult medical examination with abnormal findings: Secondary | ICD-10-CM

## 2022-04-04 DIAGNOSIS — E1122 Type 2 diabetes mellitus with diabetic chronic kidney disease: Secondary | ICD-10-CM

## 2022-04-04 DIAGNOSIS — Z125 Encounter for screening for malignant neoplasm of prostate: Secondary | ICD-10-CM

## 2022-04-04 DIAGNOSIS — I872 Venous insufficiency (chronic) (peripheral): Secondary | ICD-10-CM

## 2022-04-04 DIAGNOSIS — N401 Enlarged prostate with lower urinary tract symptoms: Secondary | ICD-10-CM

## 2022-04-04 DIAGNOSIS — I700 Atherosclerosis of aorta: Secondary | ICD-10-CM | POA: Diagnosis not present

## 2022-04-04 DIAGNOSIS — E1169 Type 2 diabetes mellitus with other specified complication: Secondary | ICD-10-CM

## 2022-04-04 DIAGNOSIS — I10 Essential (primary) hypertension: Secondary | ICD-10-CM

## 2022-04-04 DIAGNOSIS — Z79899 Other long term (current) drug therapy: Secondary | ICD-10-CM

## 2022-04-04 DIAGNOSIS — E538 Deficiency of other specified B group vitamins: Secondary | ICD-10-CM

## 2022-04-04 DIAGNOSIS — Z0000 Encounter for general adult medical examination without abnormal findings: Secondary | ICD-10-CM | POA: Diagnosis not present

## 2022-04-04 DIAGNOSIS — E039 Hypothyroidism, unspecified: Secondary | ICD-10-CM | POA: Diagnosis not present

## 2022-04-04 DIAGNOSIS — Z8249 Family history of ischemic heart disease and other diseases of the circulatory system: Secondary | ICD-10-CM | POA: Diagnosis not present

## 2022-04-04 DIAGNOSIS — K219 Gastro-esophageal reflux disease without esophagitis: Secondary | ICD-10-CM

## 2022-04-04 MED ORDER — HYDROXYZINE HCL 25 MG PO TABS: ORAL_TABLET | ORAL | 1 refills | Status: DC

## 2022-04-04 NOTE — Progress Notes (Signed)
Comprehensive Evaluation & Examination  Future Appointments  Date Time Provider Department  04/04/2022                            cpe  3:00 PM Unk Pinto, MD [facility]  04/05/2022 10:15 AM Woodroe Chen III, PA-C [facility]  04/19/2022  9:30 AM Woodroe Chen III, PA-C [facility]  04/10/2023                            cpe  3:00 PM Unk Pinto, MD [facility]             This very nice 81 y.o. MWM presents for a comprehensive evaluation and management of multiple medical co-morbidities.  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 Patient has GERD controlled on his meds. Patient has been going to the [facility]  for a slowly healing wound of his LLE /lateral ankle.                                     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          HTN predates circa 2000. Patient's BP has been controlled at home.  Today's BP is at goal -  140/80. Patient denies any cardiac symptoms as chest pain, palpitations, shortness of breath, dizziness or ankle swelling.       Patient's hyperlipidemia is  not controlled with diet and he is reticent to take meds for Cholesterol. Last lipids were not at goal:  Lab Results  Component Value Date   CHOL 175 09/14/2021   HDL 36 (L) 09/14/2021   LDLCALC 116 (H) 09/14/2021   TRIG 120 09/14/2021   CHOLHDL 4.9 09/14/2021         Patient has diet controlled T2_NIDDM  (A1c 6.5%/2015) and patient denies reactive hypoglycemic symptoms, visual blurring, diabetic polys or paresthesias. Last A1c was not at goal:   Lab Results  Component Value Date   HGBA1C 5.9 (H) 09/14/2021         Finally, patient has history of Vitamin D Deficiency  ("26"/2009) and last vitamin D was low & he has stopped his supplements:   Lab Results  Component Value Date   VD25OH 44 09/28/2020      Current Outpatient Medications on File Prior to Visit  Medication Sig   Red Yeast Rice 600 MG CAPS PRN   Sennosides (SENOKOT PO) Take as needed.   triamterene-hctz (MAXZIDE-25) 37.5-25  Take 1 tablet daily.      Allergies  Allergen Reactions   Feraheme [Ferumoxytol] Other (See Comments)    Swelling of lips and tongue, could not talk 30 minutes after the infusion.   Naprosyn [Naproxen] Swelling    Unsure if naprosyn caused this reaction   Latex    Levothyroxine     Lip swelling   Doxycycline Nausea Only     Past Medical History:  Diagnosis Date   Anemia    Arthritis    Celiac disease    diagnoses 06/24/12   Esophageal stricture    H/O hiatal hernia    Hiatal hernia    Hypertension  Hypothyroidism    Prediabetes    Shortness of breath    from oxycodone at times   Vitamin B12 deficiency    Vitamin D deficiency      Health Maintenance  Topic Date Due   COVID-19 Vaccine (1) Never done   Zoster Vaccines- Shingrix (1 of 2) Never done   FOOT EXAM  06/16/2017   OPHTHALMOLOGY EXAM  07/11/2017   INFLUENZA VACCINE  01/10/2021   TETANUS/TDAP  01/11/2021   URINE MICROALBUMIN  03/11/2021   HEMOGLOBIN A1C  03/30/2021   HPV VACCINES  Aged Out     Immunization History  Administered Date(s) Administered   DT 01/12/2011   Fluad Quad(high Dose  02/14/2019   Influenza, High Dose 05/20/2014, 05/26/2015, 03/20/2018   Influenza,inj,Quad  03/20/2018   Influenza  04/30/2013, 03/29/2016, 03/02/2017   Pneumococcal -13 05/20/2014   Pneumococcal -23 03/29/2016   Pneumococcal-23 05/20/2008   Zoster, Live 07/15/2014    Last Colon - 05/27/2012 - Dr Hilarie Fredrickson - aged out.  Past Surgical History:  Procedure Laterality Date   BACK SURGERY     3 diff surgeries for vertebrea broken   EYE SURGERY     bilateral cataract surgery   HERNIA REPAIR  1950's   bilateral groin as child   HIATAL HERNIA REPAIR     JOINT REPLACEMENT  04/2010   right knee replacement   TONSILLECTOMY      as child   TOTAL KNEE REVISION  11/20/2011   Procedure: TOTAL KNEE REVISION;  Surgeon: Mauri Pole, MD;  Location: [facility];  Service: Orthopedics;  Laterality: Right;  Right Total Knee Revision     Family History  Problem Relation Age of Onset   Esophageal cancer Father      Social History   Tobacco Use   Smoking status: Former    Packs/day: 1.00    Years: 20.00    Pack years: 20.00    Types: Cigarettes    Quit date: 06/27/1980    Years since quitting: 40.7   Smokeless tobacco: Never  Substance Use Topics   Alcohol use: No   Drug use: No      ROS Constitutional: Denies fever, chills, weight loss/gain, headaches, insomnia,  night sweats or change in appetite. Does c/o fatigue. Eyes: Denies redness, blurred vision, diplopia, discharge, itchy or watery eyes.  ENT: Denies discharge, congestion, post nasal drip, epistaxis, sore throat, earache, hearing loss, dental pain, Tinnitus, Vertigo, Sinus pain or snoring.  Cardio: Denies chest pain, palpitations, irregular heartbeat, syncope, dyspnea, diaphoresis, orthopnea, PND, claudication or edema Respiratory: denies cough, dyspnea, DOE, pleurisy, hoarseness, laryngitis or wheezing.  Gastrointestinal: Denies dysphagia, heartburn, reflux, water brash, pain, cramps, nausea, vomiting, bloating, diarrhea, constipation, hematemesis, melena, hematochezia, jaundice or hemorrhoids Genitourinary: Denies dysuria, frequency,discharge, hematuria or flank pain. Has urgency, nocturia x 2-3 & occasional hesitancy. Musculoskeletal: Denies arthralgia, myalgia, stiffness, Jt. Swelling, pain, limp or strain/sprain. Denies Falls. Skin: Denies puritis, rash, hives, warts, acne, eczema or change in skin lesion Neuro: No weakness, tremor, incoordination, spasms, paresthesia or pain Psychiatric: Denies confusion, memory loss or sensory loss. Denies Depression. Endocrine: Denies change in weight, skin, hair change, nocturia, and paresthesia, diabetic polys,  visual blurring or hyper / hypo glycemic episodes.  Heme/Lymph: No excessive bleeding, bruising or enlarged lymph nodes.   Physical Exam  BP (!) 140/78   Pulse 70   Temp (!) 97.5 F (36.4 C)   Resp 16   Ht 5' 5"  (1.651 m)   Wt 180 lb 3.2 oz (  81.7 kg)   SpO2 96%   BMI 29.99 kg/m   General Appearance: Well nourished and well groomed and in no apparent distress.  Eyes: PERRLA, EOMs, conjunctiva no swelling or erythema, normal fundi and vessels. Sinuses: No frontal/maxillary tenderness ENT/Mouth: EACs patent / TMs  nl. Nares clear without erythema, swelling, mucoid exudates. Oral hygiene is good. No erythema, swelling, or exudate. Tongue normal, non-obstructing. Tonsils not swollen or erythematous. Hearing normal.  Neck: Supple, thyroid not palpable. No bruits, nodes or JVD. Respiratory: Respiratory effort normal.  BS equal and clear bilateral without rales, rhonci, wheezing or stridor. Cardio: Heart sounds are normal with regular rate and rhythm and no murmurs, rubs or gallops. Peripheral pulses are normal and equal bilaterally without edema. No aortic or femoral bruits. Chest: symmetric with normal excursions and percussion.  Abdomen: Soft, with Nl bowel sounds. Nontender, no guarding, rebound, hernias, masses, or organomegaly.  Lymphatics: Non tender without lymphadenopathy.  Musculoskeletal: Full ROM all peripheral extremities, joint stability, 5/5 strength, and normal gait. Skin: Warm and dry without rashes, lesions, cyanosis, clubbing or  ecchymosis.  Neuro: Cranial nerves intact, reflexes equal bilaterally. Normal muscle tone, no cerebellar symptoms. Sensation intact.  Pysch: Alert and oriented X 3 with normal affect, insight and judgment appropriate.   Assessment and Plan  1. Encounter for general adult medical examination with abnormal findings   2. Essential hypertension  - EKG 12-Lead - [place], RETROPERITNL ABD,  LTD - Urinalysis, Routine w reflex microscopic -  Microalbumin / creatinine urine ratio - CBC with Differential/Platelet - COMPLETE METABOLIC PANEL WITH GFR - Magnesium - TSH  3. Hyperlipidemia associated with type 2 diabetes mellitus ([place])  - EKG 12-Lead - [place], RETROPERITNL ABD,  LTD - Lipid panel - TSH  4. Type 2 diabetes mellitus with stage 2 chronic kidney  disease, without long-term current use of insulin (HCC)  - EKG 12-Lead - [place], RETROPERITNL ABD,  LTD - Hemoglobin A1c - Insulin, random  5. Vitamin D deficiency  - VITAMIN D 25 Hydroxy   6. Hypothyroidism  - TSH  7. Chronic obstructive pulmonary disease(HCC)   8. Gastroesophageal reflux disease  - CBC with Differential/Platelet  9. Chronic venous insufficiency   10. Benign prostatic hyperplasia with lower urinary tract symptoms  - PSA  11. Screening for colorectal cancer  - POC Hemoccult Bld/Stl   12. Prostate cancer screening  - PSA  13. Screening for heart disease  - EKG 12-Lead  14. FHx: heart disease  - EKG 12-Lead - [place], RETROPERITNL ABD,  LTD  15. Screening for AAA (aortic abdominal aneurysm)  - [place], RETROPERITNL ABD,  LTD  16. Medication management  - Urinalysis, Routine w reflex microscopic - Microalbumin / creatinine urine ratio - CBC with Differential/Platelet - COMPLETE METABOLIC PANEL WITH GFR - Magnesium - Lipid panel - TSH - Hemoglobin A1c - Insulin, random - VITAMIN D 25 Hydroxyl          Patient was counseled in prudent diet, weight control to achieve/maintain BMI less than 25, BP monitoring, regular exercise and medications as discussed.  Discussed med effects and SE's. Routine screening labs and tests as requested with regular follow-up as recommended. Over 40 minutes of exam, counseling, chart review and high complex critical decision making was performed   [name], MD

## 2022-04-04 NOTE — Patient Instructions (Signed)

## 2022-04-05 ENCOUNTER — Encounter (HOSPITAL_BASED_OUTPATIENT_CLINIC_OR_DEPARTMENT_OTHER): Payer: Medicare HMO | Admitting: Physician Assistant

## 2022-04-05 DIAGNOSIS — G8929 Other chronic pain: Secondary | ICD-10-CM | POA: Diagnosis not present

## 2022-04-05 DIAGNOSIS — I87333 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 extremity: Secondary | ICD-10-CM | POA: Diagnosis not present

## 2022-04-05 DIAGNOSIS — I890 Lymphedema, not elsewhere classified: Secondary | ICD-10-CM | POA: Diagnosis not present

## 2022-04-05 DIAGNOSIS — E1151 Type 2 diabetes mellitus with diabetic peripheral angiopathy without gangrene: Secondary | ICD-10-CM | POA: Diagnosis not present

## 2022-04-05 DIAGNOSIS — I776 Arteritis, unspecified: Secondary | ICD-10-CM | POA: Diagnosis not present

## 2022-04-05 DIAGNOSIS — L97512 Non-pressure chronic ulcer of other part of right foot with fat layer exposed: Secondary | ICD-10-CM | POA: Diagnosis not present

## 2022-04-05 DIAGNOSIS — L97522 Non-pressure chronic ulcer of other part of left foot with fat layer exposed: Secondary | ICD-10-CM | POA: Diagnosis not present

## 2022-04-05 DIAGNOSIS — L97929 Non-pressure chronic ulcer of unspecified part of left lower leg with unspecified severity: Secondary | ICD-10-CM | POA: Diagnosis not present

## 2022-04-05 DIAGNOSIS — E11622 Type 2 diabetes mellitus with other skin ulcer: Secondary | ICD-10-CM | POA: Diagnosis not present

## 2022-04-05 DIAGNOSIS — E11621 Type 2 diabetes mellitus with foot ulcer: Secondary | ICD-10-CM | POA: Diagnosis not present

## 2022-04-05 LAB — CBC WITH DIFFERENTIAL/PLATELET
Absolute Monocytes: 773 cells/uL (ref 200–950)
Basophils Absolute: 74 cells/uL (ref 0–200)
Basophils Relative: 0.8 %
Eosinophils Absolute: 313 cells/uL (ref 15–500)
Eosinophils Relative: 3.4 %
HCT: 37.1 % — ABNORMAL LOW (ref 38.5–50.0)
Hemoglobin: 12.8 g/dL — ABNORMAL LOW (ref 13.2–17.1)
Lymphs Abs: 2107 cells/uL (ref 850–3900)
MCH: 31.6 pg (ref 27.0–33.0)
MCHC: 34.5 g/dL (ref 32.0–36.0)
MCV: 91.6 fL (ref 80.0–100.0)
MPV: 10 fL (ref 7.5–12.5)
Monocytes Relative: 8.4 %
Neutro Abs: 5934 cells/uL (ref 1500–7800)
Neutrophils Relative %: 64.5 %
Platelets: 413 10*3/uL — ABNORMAL HIGH (ref 140–400)
RBC: 4.05 10*6/uL — ABNORMAL LOW (ref 4.20–5.80)
RDW: 15.8 % — ABNORMAL HIGH (ref 11.0–15.0)
Total Lymphocyte: 22.9 %
WBC: 9.2 10*3/uL (ref 3.8–10.8)

## 2022-04-05 LAB — URINALYSIS, ROUTINE W REFLEX MICROSCOPIC
Bilirubin Urine: NEGATIVE
Glucose, UA: NEGATIVE
Hgb urine dipstick: NEGATIVE
Ketones, ur: NEGATIVE
Leukocytes,Ua: NEGATIVE
Nitrite: NEGATIVE
Protein, ur: NEGATIVE
Specific Gravity, Urine: 1.01 (ref 1.001–1.035)
pH: 7 (ref 5.0–8.0)

## 2022-04-05 LAB — MICROALBUMIN / CREATININE URINE RATIO
Creatinine, Urine: 46 mg/dL (ref 20–320)
Microalb Creat Ratio: 9 mcg/mg creat (ref <30)
Microalb, Ur: 0.4 mg/dL

## 2022-04-05 LAB — LIPID PANEL
Cholesterol: 162 mg/dL (ref <200)
HDL: 32 mg/dL — ABNORMAL LOW (ref >=40)
LDL Cholesterol (Calc): 103 mg/dL (calc) — ABNORMAL HIGH
Non-HDL Cholesterol (Calc): 130 mg/dL (calc) — ABNORMAL HIGH (ref <130)
Total CHOL/HDL Ratio: 5.1 (calc) — ABNORMAL HIGH (ref <5.0)
Triglycerides: 154 mg/dL — ABNORMAL HIGH (ref <150)

## 2022-04-05 LAB — COMPLETE METABOLIC PANEL WITH GFR
AG Ratio: 1.4 (calc) (ref 1.0–2.5)
ALT: 16 U/L (ref 9–46)
AST: 22 U/L (ref 10–35)
Albumin: 4.1 g/dL (ref 3.6–5.1)
Alkaline phosphatase (APISO): 90 U/L (ref 35–144)
BUN: 16 mg/dL (ref 7–25)
CO2: 30 mmol/L (ref 20–32)
Calcium: 9.6 mg/dL (ref 8.6–10.3)
Chloride: 101 mmol/L (ref 98–110)
Creat: 1.03 mg/dL (ref 0.70–1.22)
Globulin: 3 g/dL (calc) (ref 1.9–3.7)
Glucose, Bld: 111 mg/dL — ABNORMAL HIGH (ref 65–99)
Potassium: 4.8 mmol/L (ref 3.5–5.3)
Sodium: 139 mmol/L (ref 135–146)
Total Bilirubin: 1 mg/dL (ref 0.2–1.2)
Total Protein: 7.1 g/dL (ref 6.1–8.1)
eGFR: 73 mL/min/{1.73_m2} (ref >=60)

## 2022-04-05 LAB — INSULIN, RANDOM: Insulin: 21.6 u[IU]/mL — ABNORMAL HIGH

## 2022-04-05 LAB — PSA: PSA: 1.29 ng/mL (ref <=4.00)

## 2022-04-05 LAB — HEMOGLOBIN A1C
Hgb A1c MFr Bld: 6.4 % of total Hgb — ABNORMAL HIGH (ref <5.7)
Mean Plasma Glucose: 137 mg/dL
eAG (mmol/L): 7.6 mmol/L

## 2022-04-05 LAB — VITAMIN D 25 HYDROXY (VIT D DEFICIENCY, FRACTURES): Vit D, 25-Hydroxy: 59 ng/mL (ref 30–100)

## 2022-04-05 LAB — MAGNESIUM: Magnesium: 1.9 mg/dL (ref 1.5–2.5)

## 2022-04-05 LAB — VITAMIN B12: Vitamin B-12: 641 pg/mL (ref 200–1100)

## 2022-04-05 LAB — TSH: TSH: 4.15 mIU/L (ref 0.40–4.50)

## 2022-04-05 NOTE — Progress Notes (Signed)
GERHARD, RAPPAPORT ([id number]) [id number] Page 1 of 2 Visit Report for 04/05/2022 Chief Complaint Document Details Patient Name: Date of Service: [place], [name] 04/05/2022 10:15 A M Medical Record Number: [id number] Patient Account Number: [id number] Date of Birth/Sex: Treating RN: 1940/11/13 (81 y.o. M) Primary Care Provider: [name] Other Clinician: Referring Provider: Treating Provider/Extender: Azucena Kuba in Treatment: 24 Information Obtained from: Patient Chief Complaint Bilateral foot ulcers Electronic Signature(s) Signed: 04/05/2022 10:17:33 AM By: [name] PA-C Entered By: [name] on 04/05/2022 10:17:33 -------------------------------------------------------------------------------- Problem List Details Patient Name: Date of Service: [place], [name] 04/05/2022 10:15 A M Medical Record Number: [id number] Patient Account Number: [id number] Date of Birth/Sex: Treating RN: February 01, 1941 (81 y.o. 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

## 2022-04-05 NOTE — Progress Notes (Signed)
<><><><><><><><><><><><><><><><><><><><><><><><><><><><><><><><><> <><><><><><><><><><><><><><><><><><><><><><><><><><><><><><><><><> - Test results slightly outside the reference range are not unusual. If there is anything important, I will review this with you,  otherwise it is considered normal test values.  If you have further questions,  please do not hesitate to contact me at the office or via My Chart.  <><><><><><><><><><><><><><><><><><><><><><><><><><><><><><><><><> <><><><><><><><><><><><><><><><><><><><><><><><><><><><><><><><><>  -  Total Chol = 162  - Great   - But LDL Chol = 103  Slightly elevated                    (  Ideal or Goal LDL Chol is less than 70  !  )   - Recommend a stricter low cholesterol diet   - Cholesterol only comes from animal sources                                                                                        - ie. meat, dairy, egg yolks  - Eat all the vegetables you want.  - Avoid Meat, Avoid Meat,  Avoid Meat                                                                     - especially Red Meat - Beef AND Pork .  - Avoid cheese & dairy - milk & ice cream.     - Cheese is the most concentrated form of trans-fats which is                                                                                                                                                                                                    the worst thing to clog up our arteries.   - Veggie cheese is OK which can be found in the fresh  produce section at [employer] or [employer] or [employer]  <><><><><><><><><><><><><><><><><><><><><><><><><><><><><><><><><> <><><><><><><><><><><><><><><><><><><><><><><><><><><><><><><><><>  -  A1c  ( 12 week average blood sugar  ) is elevated at 6.4% ( Normal is less than 5.7%)  Blood sugar and A1c  are elevated in the borderline and  early or pre-diabetes range which has the same   300% increased risk for heart  attack, stroke, cancer and   alzheimer- type vascular dementia as full blown diabetes.   But the good news is that diet, exercise with  weight loss can cure the early diabetes at this point. <><><><><><><><><><><><><><><><><><><><><><><><><><><><><><><><><> <><><><><><><><><><><><><><><><><><><><><><><><><><><><><><><><><>  -  Your blood sugar and A1c are elevated.    uiiuIt is very important that you work harder with diet by  avoiding all foods that are white except chicken,   fish & calliflower.  - Avoid white rice  (brown & wild rice is OK),   - Avoid white potatoes  (sweet potatoes in moderation is OK),   White bread or wheat bread or anything made out of   white flour like bagels, donuts, rolls, buns, biscuits, cakes,  - pastries, cookies, pizza crust, and pasta (made from  white flour & egg whites)   - vegetarian pasta or spinach or wheat pasta is OK.  - Multigrain breads like Arnold's, Pepper[employer] or   multigrain sandwich thins or high fiber breads like   Eureka bread or "Dave's Killer" breads that are  4 to 5 grams fiber per slice !  are best.    Diet, exercise and weight loss can reverse and cure  diabetes in the early stages.   <><><><><><><><><><><><><><><><><><><><><><><><><><><><><><><><><>  -  Vitamin B12 is Normal  - So continue dose  B12 same <><><><><><><><><><><><><><><><><><><><><><><><><><><><><><><><><>  -  .All Else - CBC - Kidneys - Electrolytes - Liver - Magnesium & Thyroid    - all  Normal / OK <><><><><><><><><><><><><><><><><><><><><><><><><><><><><><><><><> <><><><><><><><><><><><><><><><><><><><><><><><><><><><><><><><><>

## 2022-04-06 NOTE — Progress Notes (Signed)
[name] Fernandez, CARNELL ([id number]) [id number] Page 1 of 8 Visit Report for 04/05/2022 Arrival Information Details Patient Name: Date of Service: [place], [name] 04/05/2022 10:15 A M Medical Record Number: [id number] Patient Account Number: [id number] Date of Birth/Sex: Treating RN: 1940-08-30 (81 y.o. [name] Fernandez Ferrari Primary Care Quron Ruddy: [name] Other Clinician: Referring Margarite Vessel: Treating Evagelia Knack/Extender: Azucena Kuba in Treatment: 24 Visit Information History Since Last Visit Added or deleted any medications: No Patient Arrived: Ambulatory Any new allergies or adverse reactions: No Arrival Time: 10:06 Had a fall or experienced change in No Accompanied By: Self activities of daily living that may affect Transfer Assistance: None risk of falls: Patient Identification Verified: Yes Signs or symptoms of abuse/neglect since last visito No Secondary Verification Process Completed: Yes Hospitalized since last visit: No Patient Requires Transmission-Based Precautions: No Implantable device outside of the clinic excluding No Patient Has Alerts: No cellular tissue based products placed in the center since last visit: Has Dressing in Place as Prescribed: Yes Has Compression in Place as Prescribed: Yes Pain Present Now: Yes Electronic Signature(s) Signed: 04/05/2022 5:59:56 PM By: Sharyn Creamer RN, BSN Entered By: Sharyn Creamer on 04/05/2022 10:07:17 -------------------------------------------------------------------------------- Clinic Level of Care Assessment Details Patient Name: Date of Service: [place], [name] 04/05/2022 10:15 A M Medical Record Number: [id number] Patient Account Number: [id number] Date of Birth/Sex: Treating RN: [birth year] (81 y.o. [name] Fernandez Primary Care Raul Winterhalter: [name] Other Clinician: Referring Iolanda Folson: Treating Shamond Skelton/Extender: Azucena Kuba in Treatment: 24 Clinic Level of Care Assessment Items TOOL 4 Quantity Score X- 1 0 Use when only an EandM is performed on FOLLOW-UP visit ASSESSMENTS - Nursing Assessment / Reassessment X- 1 10 Reassessment of Co-morbidities (includes updates in patient status) X- 1 5 Reassessment of Adherence to Treatment Plan ASSESSMENTS - Wound and Skin A ssessment / Reassessment X - Simple Wound Assessment / Reassessment - one wound 1 5 []  - 0 Complex Wound Assessment / Reassessment - multiple wounds X- 1 10 Dermatologic / Skin Assessment (not related to wound area) ASSESSMENTS - Focused Assessment X- 1 5 Circumferential Edema Measurements - multi extremities []  - 0 Nutritional Assessment / Counseling / Intervention KHADAR, MONGER ([id number]) [id number] Page 2 of 8 []  - 0 Lower Extremity Assessment (monofilament, tuning fork, pulses) []  - 0 Peripheral Arterial Disease Assessment (using hand held doppler) ASSESSMENTS - Ostomy and/or Continence Assessment and Care []  - 0 Incontinence Assessment and Management []  - 0 Ostomy Care Assessment and Management (repouching, etc.) PROCESS - Coordination of Care X - Simple Patient / Family Education for ongoing care 1 15 []  - 0 Complex (extensive) Patient / Family Education for ongoing care X- 1 10 Staff obtains Programmer, systems, Records, T Results / Process Orders est []  - 0 Staff telephones HHA, Nursing Homes / Clarify orders / etc []  - 0 Routine Transfer to another Facility (non-emergent condition) []  - 0 Routine Hospital Admission (non-emergent condition) []  - 0 New Admissions / Biomedical engineer / Ordering NPWT Apligraf, etc. , []  - 0 Emergency Hospital Admission (emergent condition) X- 1 10 Simple Discharge Coordination []  - 0 Complex (extensive) Discharge Coordination PROCESS - Special Needs []  - 0 Pediatric / Minor Patient Management []  - 0 Isolation Patient Management []  - 0 Hearing /  Language / Visual special needs []  - 0 Assessment of Community assistance (transportation, D/C planning, etc.) []  - 0 Additional assistance / Altered mentation []  - 0 Support  Surface(s) Assessment (bed, cushion, seat, etc.) INTERVENTIONS - Wound Cleansing / Measurement X - Simple Wound Cleansing - one wound 1 5 []  - 0 Complex Wound Cleansing - multiple wounds X- 1 5 Wound Imaging (photographs - any number of wounds) []  - 0 Wound Tracing (instead of photographs) X- 1 5 Simple Wound Measurement - one wound []  - 0 Complex Wound Measurement - multiple wounds INTERVENTIONS - Wound Dressings []  - 0 Small Wound Dressing one or multiple wounds X- 1 15 Medium Wound Dressing one or multiple wounds []  - 0 Large Wound Dressing one or multiple wounds []  - 0 Application of Medications - topical []  - 0 Application of Medications - injection INTERVENTIONS - Miscellaneous []  - 0 External ear exam []  - 0 Specimen Collection (cultures, biopsies, blood, body fluids, etc.) []  - 0 Specimen(s) / Culture(s) sent or taken to Lab for analysis []  - 0 Patient Transfer (multiple staff / Civil Service fast streamer / Similar devices) []  - 0 Simple Staple / Suture removal (25 or less) []  - 0 Complex Staple / Suture removal (26 or more) []  - 0 Hypo / Hyperglycemic Management (close monitor of Blood Glucose) JAQUES, MINEER ([id number]) [id number] Page 3 of 8 []  - 0 Ankle / Brachial Index (ABI) - do not check if billed separately X- 1 5 Vital Signs Has the patient been seen at the hospital within the last three years: Yes Total Score: 105 Level Of Care: New/Established - Level 3 Electronic Signature(s) Signed: 04/05/2022 6:57:47 PM By: Deon Pilling RN, BSN Entered By: Deon Pilling on 04/05/2022 10:24:27 -------------------------------------------------------------------------------- Encounter Discharge Information Details Patient Name: Date of Service: [place], [name]  04/05/2022 10:15 A M Medical Record Number: [id number] Patient Account Number: [id number] Date of Birth/Sex: Treating RN: 1940/12/13 (81 y.o. [name] Fernandez Primary Care Tasia Liz: [name] Other Clinician: Referring Domonique Cothran: Treating Treyveon Mochizuki/Extender: Azucena Kuba in Treatment: 24 Encounter Discharge Information Items Discharge Condition: Stable Ambulatory Status: Ambulatory Discharge Destination: Home Transportation: Private Auto Accompanied By: self Schedule Follow-up Appointment: Yes Clinical Summary of Care: Electronic Signature(s) Signed: 04/05/2022 6:57:47 PM By: Deon Pilling RN, BSN Entered By: Deon Pilling on 04/05/2022 10:26:27 -------------------------------------------------------------------------------- Lower Extremity Assessment Details Patient Name: Date of Service: [place], [name] 04/05/2022 10:15 A M Medical Record Number: [id number] Patient Account Number: [id number] Date of Birth/Sex: Treating RN: 03-15-1941 (81 y.o. [name] Fernandez Ferrari Primary Care Abid Bolla: [name] Other Clinician: Referring Akirra Lacerda: Treating Abia Monaco/Extender: Azucena Kuba in Treatment: 24 Edema Assessment Assessed: [Left: No] Patrice Paradise: No] Edema: [Left: Ye] [Right: s] Calf Left: Right: Point of Measurement: 32 cm From Medial Instep 39.4 cm Ankle Left: Right: Point of Measurement: 10 cm From Medial Instep 26.5 cm Vascular Assessment Left: [[id number] Page 4 of 8Right:] Pulses: Dorsalis Pedis Palpable: [[id number] Page 4 of 8Yes] Electronic Signature(s) Signed: 04/05/2022 5:59:56 PM By: Sharyn Creamer RN, BSN Entered By: Sharyn Creamer on 04/05/2022 10:08:35 -------------------------------------------------------------------------------- [place] Details Patient Name: Date of Service: [place], [name] 04/05/2022 10:15 A  M Medical Record Number: [id number] Patient Account Number: [id number] Date of Birth/Sex: Treating RN: 04-Mar-1941 (81 y.o. 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 Compression as ordered Notes: 11/16/21: Edema not controlled, patient not compliant with compression. 12/14/21:Edema not well controlled, patient will not allow compression wraps. 02/15/22: Edema better controlled, patient using ACE wrap Wound/Skin Impairment Nursing Diagnoses: Impaired tissue integrity Goals: Patient/caregiver will verbalize understanding of skin care regimen Date Initiated: 10/19/2021 Target Resolution Date: 05/04/2022 Goal Status: Active Ulcer/skin breakdown will have a volume reduction of 30% by week 4 Date Initiated: 10/19/2021 Date Inactivated: 12/14/2021 Target Resolution Date: 12/14/2021 Unmet Reason: infection, non Goal Status: Unmet compliance Interventions: Assess patient/caregiver ability to obtain necessary supplies Assess patient/caregiver ability to perform ulcer/skin care regimen upon admission and as needed Assess ulceration(s) every visit Provide education on ulcer and skin care Treatment Activities: Topical wound management initiated : 10/19/2021 Notes: 11/16/21: Wound care continues, patient not compliant with edema control. 02/15/22: Wound care regimen continues. Large co-pay for skin subs. Electronic Signature(s) Signed: 04/05/2022 6:57:47 PM By: Deon Pilling RN, BSN [place], [place] 04/05/2022 6:57:47 PM By: Deon Pilling RN, BSN SignedLoletha Grayer ([id number])  [id number] Page 5 of 8 Entered By: Deon Pilling on 04/05/2022 10:21:21 -------------------------------------------------------------------------------- Pain Assessment Details Patient Name: Date of Service: [place], [place] LD C. 04/05/2022 10:15 A M Medical Record Number: [id number] Patient Account Number: [id number] Date of Birth/Sex: Treating RN: December 09, 1940 (81 y.o. [name] Fernandez Ferrari Primary Care Jeyson Deshotel: [name] Other Clinician: Referring Abria Vannostrand: Treating Tarnesha Ulloa/Extender: Azucena Kuba in Treatment: 24 Active Problems Location of Pain Severity and Description of Pain Patient Has Paino Yes Site Locations Rate the pain. Current Pain Level: 4 Pain Management and Medication Current Pain Management: Electronic Signature(s) Signed: 04/05/2022 5:59:56 PM By: Sharyn Creamer RN, BSN Entered By: Sharyn Creamer on 04/05/2022 10:08:10 -------------------------------------------------------------------------------- Patient/Caregiver Education Details Patient Name: Date of Service: [name] 10/25/2023andnbsp10:15 A M Medical Record Number: [id number] Patient Account Number: [id number] Date of Birth/Gender: Treating RN: 21-Mar-1941 (81 y.o. [name] Fernandez Primary Care Physician: [name] Other Clinician: Referring Physician: Treating Physician/Extender: Azucena Kuba in Treatment: 24 Education Assessment Education Provided To: Patient Education Topics Provided JAQUES, MINEER ([id number]) [id number] Page 6 of 8 Wound/Skin Impairment: Handouts: Skin Care Do's and Dont's Methods: Explain/Verbal Responses: Reinforcements needed Electronic Signature(s) Signed: 04/05/2022 6:57:47 PM By: Deon Pilling RN, BSN Entered By: Deon Pilling on 04/05/2022 10:21:44 -------------------------------------------------------------------------------- Wound  Assessment Details Patient Name: Date of Service: [name] Fern[place], [name] 04/05/2022 10:15 A M Medical Record Number: [id number] Patient Account Number: [id number] Date of Birth/Sex: Treating RN: January 08, 1941 (81 y.o. [name] Fernandez Ferrari Primary Care Ollis Daudelin: [name] Other Clinician: Referring Deiondre Harrower: Treating Telia Amundson/Extender: Azucena Kuba in Treatment: 24 Wound Status Wound Number: 6 Primary Vasculitis Etiology: Wound Location: Left, Lateral Foot Wound Open Wounding Event: Gradually Appeared Status: Date Acquired: 07/25/2021 Comorbid Anemia, Chronic Obstructive Pulmonary Disease (COPD), Weeks Of Treatment: 24 History: Hypertension, Peripheral Venous Disease, Type II Diabetes, Clustered Wound: No Osteoarthritis, Neuropathy Photos Wound Measurements Length: (cm) 4 Width: (cm) 7.3 Depth: (cm) 0.2 Area: (cm) 22.934 Volume: (cm) 4.587 % Reduction in Area: -17.7% % Reduction in Volume: -17.7% Epithelialization: Small (1-33%) Tunneling: No Undermining: No Wound Description Classification: Full Thickness Without Exposed Suppor Wound Margin: Distinct, outline attached Exudate Amount: Large Exudate Type: Serosanguineous Exudate Color: red, brown t Structures Foul Odor After Cleansing: No Slough/Fibrino Yes Wound Bed Granulation Amount: Large (67-100%) Exposed Structure Granulation Quality: Red, Pink, Hyper-granulation Fascia Exposed: No Necrotic Amount: Small (  1-33%) Fat Layer (Subcutaneous Tissue) Exposed: Yes Necrotic Quality: Adherent Slough Tendon Exposed: No Muscle Exposed: No Joint Exposed: No Bone Exposed: No [address] MAVERICK, DIEUDONNE ([id number]) [id number] Page 7 of 8 Texture Color No Abnormalities Noted: Yes No Abnormalities Noted: No Atrophie Blanche: No Moisture Cyanosis: No No Abnormalities Noted: Yes Ecchymosis: No Erythema: No Hemosiderin Staining: No Mottled: No Pallor:  No Rubor: No Temperature / Pain Temperature: No Abnormality Treatment Notes Wound #6 (Foot) Wound Laterality: Left, Lateral Cleanser Soap and Water Discharge Instruction: May shower and wash wound with dial antibacterial soap and water prior to dressing change. Peri-Wound Care Zinc Oxide Ointment 30g tube Discharge Instruction: Apply Zinc Oxide to periwound with each dressing change Topical Gentamicin Discharge Instruction: As directed by physician Primary Dressing Hydrofera Blue Ready Foam, 4x5 in Discharge Instruction: Apply to wound bed as instructed Secondary Dressing Woven Gauze Sponge, Non-Sterile 4x4 in Discharge Instruction: Apply over primary dressing as directed. Secured With [employer] 4x5 (in/yd) Discharge Instruction: Secure with Coban as directed. Kerlix Roll Sterile, 4.5x3.1 (in/yd) Discharge Instruction: Secure with Kerlix as directed. 33M Medipore Soft Cloth Surgical T 2x10 (in/yd) ape Discharge Instruction: Secure with tape as directed. Compression Wrap ACE Wrap Discharge Instruction: For Compression Compression Stockings Add-Ons Electronic Signature(s) Signed: 04/05/2022 5:59:56 PM By: Sharyn Creamer RN, BSN Entered By: Sharyn Creamer on 04/05/2022 10:06:31 -------------------------------------------------------------------------------- [place] Details Patient Name: Date of Service: [name] Fernandez, [name] 04/05/2022 10:15 A M Medical Record Number: [id number] Patient Account Number: [id number] Date of Birth/Sex: Treating RN: 1940-11-15 (81 y.o. [name] Fernandez Ferrari Primary Care Julissa Browning: [name] Other Clinician: Referring Aking Klabunde: Treating Cambreigh Dearing/Extender: Azucena Kuba in Treatment: [address] ([id number]) [id number] Page 8 of 8 Vital Signs Time Taken: 10:02 Temperature (F): 98.1 Height (in): 66 Pulse (bpm): 68 Weight (lbs): 184 Respiratory Rate (breaths/min):  18 Body Mass Index (BMI): 29.7 Blood Pressure (mmHg): 151/72 Reference Range: 80 - 120 mg / dl Electronic Signature(s) Signed: 04/05/2022 5:59:56 PM By: Sharyn Creamer RN, BSN Entered By: Sharyn Creamer on 04/05/2022 10:08:00

## 2022-04-12 ENCOUNTER — Encounter (HOSPITAL_BASED_OUTPATIENT_CLINIC_OR_DEPARTMENT_OTHER): Payer: Medicare HMO | Admitting: Physician Assistant

## 2022-04-19 ENCOUNTER — Encounter (HOSPITAL_BASED_OUTPATIENT_CLINIC_OR_DEPARTMENT_OTHER): Payer: Medicare HMO | Admitting: Internal Medicine

## 2022-05-03 ENCOUNTER — Encounter (HOSPITAL_BASED_OUTPATIENT_CLINIC_OR_DEPARTMENT_OTHER): Payer: Medicare HMO | Attending: Physician Assistant | Admitting: Physician Assistant

## 2022-05-03 DIAGNOSIS — I87332 Chronic venous hypertension (idiopathic) with ulcer and inflammation of left lower extremity: Secondary | ICD-10-CM | POA: Diagnosis not present

## 2022-05-03 DIAGNOSIS — L97522 Non-pressure chronic ulcer of other part of left foot with fat layer exposed: Secondary | ICD-10-CM | POA: Diagnosis not present

## 2022-05-03 NOTE — Progress Notes (Addendum)
[name] Fernandez, [name] Fernandez ([id number]) [id number] Page 1 of 10 Visit Report for 05/03/2022 Chief Complaint Document Details Patient Name: Date of Service: [name] Fernandez, [name] 05/03/2022 10:15 A M Medical Record Number: [id number] Patient Account Number: [id number] Date of Birth/Sex: Treating RN: 02/06/1941 (81 y.o. M) Primary Care Provider: [name] Other Clinician: Referring Provider: Treating Provider/Extender: Azucena Kuba in Treatment: 28 Information Obtained from: Patient Chief Complaint Bilateral foot ulcers Electronic Signature(s) Signed: 05/03/2022 10:30:01 AM By: [name] PA-C Entered By: [name] on 05/03/2022 10:30:01 -------------------------------------------------------------------------------- HPI Details Patient Name: Date of Service: [name] Fernandez, [name] 05/03/2022 10:15 A M Medical Record Number: [id number] Patient Account Number: [id number] Date of Birth/Sex: Treating RN: 06-06-1941 (81 y.o. M) Primary Care Provider: [name] Other Clinician: Referring Provider: Treating Provider/Extender: Azucena Kuba in Treatment: 28 History of Present Illness HPI Description: 81 year old gentleman known to have swelling of his left lower extremity with some ulceration along the medial ankle has been having these problems for the last 3-4 months and does not know how it came on. No history of injury or no history of any infection in this area. He is known to have diabetes mellitus controlled with diet, hyperlipidemia, hypertension and chronic lumbar back pain with sciatica which is being treated by his PCP. Last hemoglobin A1c was 6.3 in June 2017. Last medical history is significant for esophageal stricture, hiatal hernia, vitamin D deficiency, status post back surgery 3, hernia repair as a child for both groins, hiatal hernia repair, joint replacement and revision of a knee  surgery on the right side. He has quit smoking in 1982. He has never had a Doppler study of his lower extremity except remotely when he had knee surgery they looked for a blood clot on the right side. 09/13/2016 -- venous reflux study done on 09/12/2016 showed there is evidence of greater saphenous vein reflux in the left lower extremity and the small saphenous vein and the posterior thigh is not competent. 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 With these results he definitely needs a referral to the vascular surgeons 09/20/2016 -- he has an appointment to see the vascular surgeons on May 1 10/03/2016 -- patient had a 4 layer compression on his left lower extremity and had a lot of discomfort and pain. 10/11/2016-- was seen by Dr. Althea [name] -- review of his data he recommended staged laser ablation of his great and then small saphenous veins for reduction odd of his venous hypertension. He did examine his right leg with the SonoSite and he has dilatation of the saphenous vein on the right lower extremity too. Since there was no evidence of ulceration he recommended observation of the right lower extremity. He recommended to continue with local wound care at the wound center until his wound was completely healed. 10/18/2016 -- the patient has not been very compliant with his compression, his diet and his diuretics and as a result his lymphedema has increased markedly 10/25/2016 -- he has been compliant this week, has worn his 2 press compression wraps, taken his diuretics and is watching his salt intake. 11/08/2016 -- he had his venous procedure done last week and details of this have been reported and reviewed in his electronic medical record. he had a laser ablation of his great saphenous vein and this was done from mid calf to just below the saphenofemoral junction. He would return next  week for ultrasound follow-up. He will then undergo the small saphenous vein ablation in a  few weeks. 11/15/2016 -- his postprocedure visit showed good closure of his left great saphenous vein from the mid calf to 1-1/2 cm from the saphenofemoral junction. He had excellent early results from the ablation. The ablation of the left small saphenous vein was planned in a week 12/06/2016 -- he had his small saphenous vein venous ablation a week before and the duplex showed closure of his small saphenous vein to within half centimeter office saphenous popliteal junction with no DVT and he was pleased with the results. He recommended continue with elevation and compression and to see them back on an as-needed basis 12/19/16; the patient's wound is actually closed. Sometime over the weekend he developed a small abrasion on the lower calf just above the original wound on [place] ([id number]) [id number] Page 2 of 10 the left medial malleolus although this is closed as well Readmission: 01/18/18 on evaluation today patient presents after having last been seen in our clinic July 2018. Subsequently he is a reoccurrence of the ulcers on the left ankle both medial and now a new area lateral that had been present back room for about a month he tells me. He states that this has done very well for about a year he really does not know of any injury or anything that happened that would have caused this reopening at this point. He has continued to wear compression stockings which is appropriate. He does not have lymphedema pumps. He has continued to also keep the area clean and dry as best he could. With that being said now that is been reopened is been harder for him to take care of this as far as compression stockings are concerned keeping them clean along with continuing to manage his fluid and swelling. His ABI appears to be great on the left registering at 1.14 today. He has previously undergone a venous ablation. Other than compression/lymphedema pumps he's really done  everything that he can to help manage and prevent these ulcers from forming. 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 01/30/18 on evaluation today patient appears to actually be doing very well in regard to his medial ankle ulcer. It's the lateral ulcer that actually is not doing quite as well today. Fortunately he did have some alginate left ovary start using this on the wounds and the medial ankle actually seems to be doing much better. The lateral ankle does actually need something to con a help with slough and buildup. The collagen really did not seem to do much for him in that regard. 02/13/18 on evaluation today patient appears to be doing about the same in regard to his ulcers. Unfortunately I do not feel like were making good progress at this time. When I have debrided the wound the slough seems to come back fairly quickly in the lateral ankle ulcer. There does not appear to be any evidence of infection at this time. No fevers, chills, nausea, or vomiting noted at this time. 02/27/18 on evaluation today patient actually appears to be showing some signs of improvement in regard to both wound areas. I'm insurance on the medial aspect of his left lower extremity at the ankle whether or not the alginate may be sticking and causing some tearing off of new skin attempting to grow. With that being said the patient states it does not seem to be doing such just pulls up some of the dried dead skin  around. Nonetheless this is something I wanted to watch out for going forward and actually I recommended that he take the dressing off in the shower when you could get it completely wet before removal to see how this does. Subsequently in regard to the lateral ankle ulcer he still has slough noted at this point although I do believe that he is tolerating the Medihoney much better I wish you could have gotten the Santyl but unfortunately the Santyl was too expensive gonna cost him $250. 03/06/18 on evaluation today patient  appears to be doing about the same in regard to both ulcers of his left medial and lateral malleolus sites. He's been tolerating the dressing changes without complication. Unfortunately things do not seem to be improving in regard to either side at this time. Overall I think we may need to switch things up a little bit as far as the way we are performing the dressing changes currently. 03/27/18 on evaluation today patient's wound bed actually appears to be doing much better at both locations in regard to his ankle ulcers. He has been tolerating the dressing changes at this time without complication. Overall I'm very pleased with how things appear. The patient likewise states that he's much better in regard to discomfort at this time. 04/10/18 on evaluation today patient actually appears to be doing well in regard to his left lateral ankle ulcer. He has been tolerating the dressing changes without complication. Fortunately there does not appear to be any evidence of infection. Overall I do feel like he is tolerating the Medihoney without complication. 04/24/18 upon evaluation today patient's wound actually appears to be healed on the lateral aspect of his ankle the medial aspect still continues to remain close without any evidence of complication at this time. Overall I have been extremely pleased with how things have progressed up to this point. The patient likewise is very happy he's not having any significant pain this time. He does wears compression stocking on a regular basis. Readmission: 10-19-2021 upon evaluation today patient presents for evaluation here in the clinic concerning issues with a left lateral ulcer as well as a right lateral ulcer both along the aspect of his foot getting close to the ankle. This left area is the same place that I treated back in 2024. 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 This appears to be very inflamed and is also  very painful. In the past he has been completely unable to tolerate the use of any compression wraps unfortunately. There does not appear to be any evidence of active infection locally nor systemically at this time which is good news. I do believe however we need to try to see what we can do to calm down the inflammation I think triamcinolone topically as well as oral prednisone would likely be indicated in this case. Patient's medical history really has not changed significantly since I last saw him in actually 2019 though I think is stated 2020 above. 10-26-21 upon evaluation today patient appears to still be having a lot of irritation and inflammation in regard to the ankles laterally. The left is greater than the right. With that being said I do believe that he would benefit from a biopsy to confirm whether or not this may indeed be vasculitis. It actually seems like that physically and on examination but again I want to be sure that we are on the right track as far as treatment  is concerned. The good news is he did not have a DVT I called him about that on Friday this was all in regard to the right leg. Overall I am very pleased in that regard. 11-02-2021 upon evaluation today patient appears to be doing poorly still in regard to his left lower extremity. Apparently he had a fever on Sunday which was around 101 and he was severely sick feeling and disoriented according to his wife. She came with him today because she did not think that he would actually tell me what was going on. With that being said unfortunately he did not go to the hospital he actually has been doing a little better over the past few days he been taking amoxicillin he has not taken any other medications orally at this point that are different. He has not been on any antibiotics either more recently other than the amoxicillin. Nonetheless he has not had a repeat in the past 3 days of that issue. 11-09-2021 upon evaluation today  patient presents after having had quite an ordeal over the past several days. Subsequently yesterday I got a call from [facility] here in [place] concerning the patient and the fact that he was having bleeding that been going on for the past 24 hours and was not stopping. Subsequently my advice was to have the patient go to the ER for further evaluation and treatment. It seems to me that he had a regular way part of the wound down to the point that a blood vessel and open. Apparently he was filling up trash bags wrapped around his foot with blood. In fact his lab review which I did do him as well today showed that he went from a white hemoglobin on 09-14-2021 of 13.6 down to a hemoglobin of 11.2 on 11-08-2021 this was yesterday. Subsequently this is due to the amount of blood loss that he has had acutely. Again I do not believe he is at transfusion stage but at the same time he is extremely weak and states that a couple times when he is going to stand up he is actually fallen back into his chair. Nonetheless I do believe that he probably needs to supplement with iron and I did recommend that there are some over-the-counter products he could get in this regard. Subsequently also suggested that the patient needs to be drinking plenty of water he apparently drinks Coke and he drinks coffee but that is about it. Subsequently following this conversation his wife was present during this time as well and I do think that he is going to try to drink more I think it is of utmost importance. With that being said I did review his PCR culture as well it was positive for 2 organisms. This was Pseudomonas and Enterobacter. Both were showing up as being very prevalent in the PCR culture and subsequently Cipro will take care of the situation which is what I am recommending at this point based on what we see. This will take the place of the Bactrim and can have him discontinue the Bactrim the only thing is he is getting need to  stop taking the hydroxyzine if he has been taking it that is the Vistaril and he tells me that he only takes this when he itches he has not been taking it recently. 6/7; patient arrives in clinic today with the wound on the right foot actually looking some better the area on the left lateral looking about the same. Problematically, he is  having edema fluid leakage on the medial part of the left foot and ankle causing skin breakdown but no open wound. He has been very resistant to the notion of compression wraps. He has been using Santyl on both wound areas at home changing the dressing himself Partway through our visit today it became clear he had had a fall on Sunday 3 days ago. Since then he has had a lot of problems with his right shoulder 11-23-2021 upon evaluation today patient appears to be doing well with regard to his wound. Has been tolerating dressing changes without complication. Fortunately there does not appear to be any signs of infection he was wrapped last week when Dr. Dellia Nims saw him on the left. He was having some breakdown medially which they were concerned about. He did keep the wrap on until Monday but states it got extremely wet. For that reason I am thinking of doing a nurse visit on Friday so that we can get some of the swelling down right now by Friday we should have a lot of that out that we can switch out and put on a fresh dressing to get him through till next Wednesday. He is in agreement with giving that a try. SKYLUR, FUSTON ([id number]) [id number] Page 3 of 10 11-30-2021 upon evaluation today patient actually appears to be doing better with regard to the overall appearance of his wound. Fortunately there does not appear to be any signs of active infection locally or systemically which is great news. With that being said he has been tolerating the dressing changes without complication other than the fact that from Wednesday to Friday he did well  with the compression wrap from Friday till yesterday he tells me the drainage was so bad and the smell was so bad he had to take this off. It does appear that he is very macerated around the lower portion down around the heel and I do think that this is an issue. Unfortunately we cannot get home health to help with this currently. 12-07-2021 upon evaluation today patient appears to be doing well currently in regard to his wound. He is actually showing signs of excellent improvement which is great news. I am very pleased with where things stand. I do think that the progress is being made. With that being said the patient is tolerating the Santyl quite well and I think is doing an awesome job for him. 12-14-2021 upon evaluation today patient appears to be doing well with regard to his wounds he is making progress is still draining a lot however which I completely understand. With that being said fortunately there does not appear to be any signs of active infection locally or systemically at this time. 12-21-2021 upon evaluation today patient appears to be doing well currently in regard to his wound. He is actually showing signs of significant improvement this is slow but nonetheless week by week we are seeing this improve on the lateral aspect of his foot. The measurements are little bit better but more importantly the wound surface is actually a whole lot cleaner. The patient is pleased to hear this. With that being said he tells me that it is really hard for him to see exactly what all is going on. Nonetheless I am very pleased with all the new red tissue that were seeing granulating and hopefully we get this clean shortly and we can even switch to a different dressing to try to get it to close. 12-28-2021 upon evaluation today  patient appears to be doing well currently in regard to his wound is actually showing signs of improvement we are going to perform some debridement today to clearway some of the  necrotic debris. Overall I think that we are at the point where we do not have to use Santyl over the home wound I think using it just on the upper portion may be appropriate if we can get this completely cleaned away. 7/6; substantial wound on the left lateral foot. Using Santyl and Hydrofera Blue there is been good improvement since the last time I saw this. The patient does not tolerate compression wraps therefore he is using Ace wraps. Fortunately he appears to be doing this fairly well 01-11-22 upon evaluation today patient appears to be doing better in regard to his wound in general though he is having some issues here with blue-green drainage. I am going to go ahead and see about getting him sent in a prescription for gentamicin cream. 01-18-2022 upon evaluation today patient actually appears to be doing quite well in regards to his wound there green drainage we will previously done with has improved. Fortunately I do not see any evidence of active infection locally or systemically which is great news. No fever chills noted 02-01-2022 upon evaluation today patient appears to be doing well with regard to his wound. Has been tolerating the dressing changes without complication. Fortunately I see no signs of active infection locally or systemically at this time which is great news. 02-15-2022 upon evaluation today patient appears to be doing excellent in regard to his wound he is actually showing signs of good improvement. This wound is looking much better and in fact were getting close to not even needing the Santyl any longer. Overall though I think is still good to be helpful with the more proximal portion of the wound. 03-01-2022 upon evaluation today patient appears to be doing well with regard to his ulcer although there is some irritation in the skin around. I do believe this is in a large part due to drainage and I discussed that with him today. I do think that we need to try to see what we can do  to try to prevent this from being an ongoing issue going forward. 03-15-2022 upon evaluation today patient unfortunately is having increased discomfort and pain. It seems like this is likely due to infection based on what I am seeing the wound is also measuring a little bit larger than what it has been in the past. Unfortunately I am seeing some signs of the patient likely has been having increased drainage she also has the [facility] ready not the ready transfer which also does not allow anything to drain completely through to an outside dressing I think this can be a little bit of an issue as well. 03-22-2022 upon evaluation today patient's foot actually seems to be doing significantly better which is great news. Fortunately I do not see any signs of active infection locally or systemically which is great news and overall I am extremely pleased with where things stand currently. 04-05-2022 upon evaluation today patient appears to be doing well currently in regard to his wound. He is actually been making good progress here and in fact in the 2 weeks since I last saw him he has significant improvement which is great news. Fortunately I do not see any evidence of infection locally or systemically which is great news. No fevers, chills, nausea, vomiting, or diarrhea. 05-03-2022 upon evaluation today  patient appears to be doing better in regard to his wound compared to 2 weeks ago he showed me pictures of what it looked like at that time it appears that he was likely infected. He started to take some of the leftover antibiotics that he had from previous with the Levaquin and has had about 7 to 9 days worth he states that he ran out of this today. Nonetheless he tells me it is much better and is not hurting nearly as bad and overall is doing significantly improved compared to what he was dealing with previous. Overall I am extremely pleased with that aspect of things. Nonetheless I am sorry to hear  this was giving him such trouble. Electronic Signature(s) Signed: 05/03/2022 1:54:24 PM By: [name] PA-C Entered By: [name] on 05/03/2022 13:54:24 -------------------------------------------------------------------------------- Physical Exam Details Patient Name: Date of Service: [place], [name] 05/03/2022 10:15 A M Medical Record Number: [id number] Patient Account Number: [id number] Date of Birth/Sex: Treating RN: 1940/12/21 (81 y.o. M) Primary Care Provider: [name] Other Clinician: Referring Provider: Treating Provider/Extender: Azucena Kuba in Treatment: 75 Constitutional Well-nourished and well-hydrated in no acute distress. KIAM, BRANSFIELD ([id number]) [id number] Page 4 of 10 Respiratory normal breathing without difficulty. Psychiatric this patient is able to make decisions and demonstrates good insight into disease process. Alert and Oriented x 3. pleasant and cooperative. Notes Upon inspection patient's wound bed actually showed signs of some necrotic tissue however he is in exquisite pain he still may be somewhat infected. For that reason he did not want me to perform any debridement and I was very careful with examination. I am going to go ahead and see about sending in a prescription for him for the Levaquin just to continue the treatment that he had already started at home. Electronic Signature(s) Signed: 05/03/2022 1:54:45 PM By: [name] PA-C Entered By: [name] on 05/03/2022 13:54:45 -------------------------------------------------------------------------------- Physician Orders Details Patient Name: Date of Service: [place], [name] 05/03/2022 10:15 A M Medical Record Number: [id number] Patient Account Number: [id number] Date of Birth/Sex: Treating RN: Jun 29, 1940 (81 y.o. 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 - with Jeri Cos, PA Wednesday (Dr. Dellia Nims covering) Return A Other: - Continue taking Levaquin Prism:Supplies Anesthetic (In clinic) Topical Lidocaine 5% applied to wound bed Cellular or Tissue Based Products Cellular or Tissue Based Product Type: - Will run IVR for Apligraf= $295 [employer], $3400 out of pocket with $1440 met. Bathing/ Shower/ Hygiene May shower and wash wound with soap and water. - wash with antibacterial soap when changing dressing Edema Control - Lymphedema / SCD / Other Elevate legs to the level of the heart or above for 30 minutes daily and/or when sitting, a frequency of: - throughout the day Patient to wear own compression stockings every day. - Use stocking daily to right leg Moisturize legs daily. - Eucerin (  in jar) Additional Orders / Instructions Follow Nutritious Diet Wound Treatment Wound #6 - Foot Wound Laterality: Left, Lateral Cleanser: Soap and Water Every Other Day/30 Days Discharge Instructions: May shower and wash wound with dial antibacterial soap and water prior to dressing change. Peri-Wound Care: Triamcinolone 15 (g) Every Other Day/30 Days Discharge Instructions: Use triamcinolone 15 (g) as directed RAMCES, SHOMAKER ([id number]) [id number] Page 5 of 10 Peri-Wound Care: Zinc Oxide Ointment  30g tube Every Other Day/30 Days Discharge Instructions: Apply Zinc Oxide to periwound with each dressing change Topical: Gentamicin Every Other Day/30 Days Discharge Instructions: As directed by physician Prim Dressing: Hydrofera Blue Ready Foam, 4x5 in Every Other Day/30 Days ary Discharge Instructions: Apply to wound bed as instructed Prim Dressing: Santyl Ointment Every Other Day/30 Days ary Discharge Instructions: apply nickel thick amount just to sloughy/yellow areas Secondary Dressing: ABD Pad, 8x10 Every Other Day/30 Days Discharge Instructions: Apply over primary dressing as directed. Secured With: Coban Self-Adherent Wrap 4x5 (in/yd) Every Other Day/30 Days Discharge Instructions: Secure with Coban as directed. Secured With: [employer], 4.5x3.1 (in/yd) (Generic) Every Other Day/30 Days Discharge Instructions: Secure with Kerlix as directed. Secured With: 31M Medipore Public affairs consultant Surgical T 2x10 (in/yd) (Generic) Every Other Day/30 Days ape Discharge Instructions: Secure with tape as directed. Compression Wrap: ACE Wrap Every Other Day/30 Days Discharge Instructions: For Compression Patient Medications llergies: latex, Feraheme, Naprosyn, levothyroxine A Notifications Medication Indication Start End 05/03/2022 levofloxacin DOSE 1 - oral 500 mg tablet - 1 tablet oral once daily x 30 days Electronic Signature(s) Signed: 05/03/2022 1:57:45 PM By: [name] PA-C Entered By: [name] on 05/03/2022 13:57:44 -------------------------------------------------------------------------------- Problem List Details Patient Name: Date of Service: [place], [name] 05/03/2022 10:15 A M Medical Record Number: [id number] Patient Account Number: [id number] Date of Birth/Sex: Treating RN: 1941-03-25 (81 y.o. 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 10/19/2021 No Yes MELROY, BOUGHER ([id number]) [id number] Page 6 of 10 [id number] Non-pressure chronic ulcer of other part of left foot with fat layer exposed 10/19/2021 No Yes L97.512 Non-pressure chronic ulcer of other part of right foot with fat layer exposed 10/19/2021 No Yes E11.622 Type 2 diabetes mellitus with other skin ulcer 10/19/2021 No Yes Inactive Problems Resolved Problems Electronic Signature(s) Signed: 05/03/2022 10:29:07 AM By: [name] PA-C Entered By: [name] on 05/03/2022 10:29:06 -------------------------------------------------------------------------------- Progress Note Details Patient Name: Date of Service: [place], [name] 05/03/2022 10:15 A M Medical Record Number: [id number] Patient Account Number: [id number] Date of Birth/Sex: Treating RN: [birth year] (81 y.o. M) Primary Care Provider: [name] Other Clinician: Referring Provider: Treating Provider/Extender: Azucena Kuba in Treatment: 28 Subjective Chief Complaint Information obtained from Patient Bilateral foot ulcers History of Present Illness (HPI) 81 year old gentleman known to have swelling of his left lower extremity with some ulceration along the medial ankle has been having these problems for the last 3-4 months and does not know how it came on. No history of injury or no history of any infection in this area. He is known to have diabetes mellitus controlled with diet, hyperlipidemia, hypertension and chronic lumbar  back pain with sciatica which is being treated by his PCP. Last hemoglobin A1c was 6.3 in June  2017. Last medical history is significant for esophageal stricture, hiatal hernia, vitamin D deficiency, status post back surgery o3, hernia repair as a child for both groins, hiatal hernia repair, joint replacement and revision of a knee surgery on the right side. He has quit smoking in 1982. He has never had a Doppler study of his lower extremity except remotely when he had knee surgery they looked for a blood clot on the right side. 09/13/2016 -- venous reflux study done on 09/12/2016 showed there is evidence of greater saphenous vein reflux in the left lower extremity and the small saphenous vein and the posterior thigh is not competent. 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 With these results he definitely needs a referral to the vascular surgeons 09/20/2016 -- he has an appointment to see the vascular surgeons on May 1 10/03/2016 -- patient had a 4 layer compression on his left lower extremity and had a lot of discomfort and pain. 10/11/2016-- was seen by Dr. Althea [name] -- review of his data he recommended staged laser ablation of his great and then small saphenous veins for reduction odd of his venous hypertension. He did examine his right leg with the SonoSite and he has dilatation of the saphenous vein on the right lower extremity too. Since there was no evidence of ulceration he recommended observation of the right lower extremity. He recommended to continue with local wound care at the wound center until his wound was completely healed. 10/18/2016 -- the patient has not been very compliant with his compression, his diet and his diuretics and as a result his lymphedema has increased markedly 10/25/2016 -- he has been compliant this week, has worn his 2 press compression wraps, taken his diuretics and is watching his salt intake. 11/08/2016 -- he had his venous procedure done last week and details of this have been reported and reviewed in his electronic medical record.  he had a laser ablation of his great saphenous vein and this was done from mid calf to just below the saphenofemoral junction. He would return next week for ultrasound follow-up. He will then undergo the small saphenous vein ablation in a few weeks. 11/15/2016 -- his postprocedure visit showed good closure of his left great saphenous vein from the mid calf to 1-1/2 cm from the saphenofemoral junction. He had excellent early results from the ablation. The ablation of the left small saphenous vein was planned in a week 12/06/2016 -- he had his small saphenous vein venous ablation a week before and the duplex showed closure of his small saphenous vein to within half centimeter office saphenous popliteal junction with no DVT and he was pleased with the results. He recommended continue with elevation and compression and to see them back on an as-needed basis 12/19/16; the patient's wound is actually closed. Sometime over the weekend he developed a small abrasion on the lower calf just above the original wound on the left medial malleolus although this is closed as well KELVEN, FLATER ([id number]) [id number] Page 7 of 10 Readmission: 01/18/18 on evaluation today patient presents after having last been seen in our clinic July 2018. Subsequently he is a reoccurrence of the ulcers on the left ankle both medial and now a new area lateral that had been present back room for about a month he tells me. He states that this has done very well  for about a year he really does not know of any injury or anything that happened that would have caused this reopening at this point. He has continued to wear compression stockings which is appropriate. He does not have lymphedema pumps. He has continued to also keep the area clean and dry as best he could. With that being said now that is been reopened is been harder for him to take care of this as far as compression stockings are concerned keeping  them clean along with continuing to manage his fluid and swelling. His ABI appears to be great on the left registering at 1.14 today. He has previously undergone a venous ablation. Other than compression/lymphedema pumps he's really done everything that he can to help manage and prevent these ulcers from forming. 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 01/30/18 on evaluation today patient appears to actually be doing very well in regard to his medial ankle ulcer. It's the lateral ulcer that actually is not doing quite as well today. Fortunately he did have some alginate left ovary start using this on the wounds and the medial ankle actually seems to be doing much better. The lateral ankle does actually need something to con a help with slough and buildup. The collagen really did not seem to do much for him in that regard. 02/13/18 on evaluation today patient appears to be doing about the same in regard to his ulcers. Unfortunately I do not feel like were making good progress at this time. When I have debrided the wound the slough seems to come back fairly quickly in the lateral ankle ulcer. There does not appear to be any evidence of infection at this time. No fevers, chills, nausea, or vomiting noted at this time. 02/27/18 on evaluation today patient actually appears to be showing some signs of improvement in regard to both wound areas. I'm insurance on the medial aspect of his left lower extremity at the ankle whether or not the alginate may be sticking and causing some tearing off of new skin attempting to grow. With that being said the patient states it does not seem to be doing such just pulls up some of the dried dead skin around. Nonetheless this is something I wanted to watch out for going forward and actually I recommended that he take the dressing off in the shower when you could get it completely wet before removal to see how this does. Subsequently in regard to the lateral ankle ulcer he still has  slough noted at this point although I do believe that he is tolerating the Medihoney much better I wish you could have gotten the Santyl but unfortunately the Santyl was too expensive gonna cost him $250. 03/06/18 on evaluation today patient appears to be doing about the same in regard to both ulcers of his left medial and lateral malleolus sites. He's been tolerating the dressing changes without complication. Unfortunately things do not seem to be improving in regard to either side at this time. Overall I think we may need to switch things up a little bit as far as the way we are performing the dressing changes currently. 03/27/18 on evaluation today patient's wound bed actually appears to be doing much better at both locations in regard to his ankle ulcers. He has been tolerating the dressing changes at this time without complication. Overall I'm very pleased with how things appear. The patient likewise states that he's much better in regard to discomfort at this time. 04/10/18 on evaluation today  patient actually appears to be doing well in regard to his left lateral ankle ulcer. He has been tolerating the dressing changes without complication. Fortunately there does not appear to be any evidence of infection. Overall I do feel like he is tolerating the Medihoney without complication. 04/24/18 upon evaluation today patient's wound actually appears to be healed on the lateral aspect of his ankle the medial aspect still continues to remain close without any evidence of complication at this time. Overall I have been extremely pleased with how things have progressed up to this point. The patient likewise is very happy he's not having any significant pain this time. He does wears compression stocking on a regular basis. Readmission: 10-19-2021 upon evaluation today patient presents for evaluation here in the clinic concerning issues with a left lateral ulcer as well as a right lateral ulcer both along  the aspect of his foot getting close to the ankle. This left area is the same place that I treated back in 2024. 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 This appears to be very inflamed and is also very painful. In the past he has been completely unable to tolerate the use of any compression wraps unfortunately. There does not appear to be any evidence of active infection locally nor systemically at this time which is good news. I do believe however we need to try to see what we can do to calm down the inflammation I think triamcinolone topically as well as oral prednisone would likely be indicated in this case. Patient's medical history really has not changed significantly since I last saw him in actually 2019 though I think is stated 2020 above. 10-26-21 upon evaluation today patient appears to still be having a lot of irritation and inflammation in regard to the ankles laterally. The left is greater than the right. With that being said I do believe that he would benefit from a biopsy to confirm whether or not this may indeed be vasculitis. It actually seems like that physically and on examination but again I want to be sure that we are on the right track as far as treatment is concerned. The good news is he did not have a DVT I called him about that on Friday this was all in regard to the right leg. Overall I am very pleased in that regard. 11-02-2021 upon evaluation today patient appears to be doing poorly still in regard to his left lower extremity. Apparently he had a fever on Sunday which was around 101 and he was severely sick feeling and disoriented according to his wife. She came with him today because she did not think that he would actually tell me what was going on. With that being said unfortunately he did not go to the hospital he actually has been doing a little better over the past few days he been taking amoxicillin he has not taken any other  medications orally at this point that are different. He has not been on any antibiotics either more recently other than the amoxicillin. Nonetheless he has not had a repeat in the past 3 days of that issue. 11-09-2021 upon evaluation today patient presents after having had quite an ordeal over the past several days. Subsequently yesterday I got a call from [place] here in [place] concerning the patient and the fact that he was having bleeding that been going on for the past 24 hours and was not stopping. Subsequently my  advice was to have the patient go to the ER for further evaluation and treatment. It seems to me that he had a regular way part of the wound down to the point that a blood vessel and open. Apparently he was filling up trash bags wrapped around his foot with blood. In fact his lab review which I did do him as well today showed that he went from a white hemoglobin on 09-14-2021 of 13.6 down to a hemoglobin of 11.2 on 11-08-2021 this was yesterday. Subsequently this is due to the amount of blood loss that he has had acutely. Again I do not believe he is at transfusion stage but at the same time he is extremely weak and states that a couple times when he is going to stand up he is actually fallen back into his chair. Nonetheless I do believe that he probably needs to supplement with iron and I did recommend that there are some over-the-counter products he could get in this regard. Subsequently also suggested that the patient needs to be drinking plenty of water he apparently drinks Coke and he drinks coffee but that is about it. Subsequently following this conversation his wife was present during this time as well and I do think that he is going to try to drink more I think it is of utmost importance. With that being said I did review his PCR culture as well it was positive for 2 organisms. This was Pseudomonas and Enterobacter. Both were showing up as being very prevalent in the PCR culture and  subsequently Cipro will take care of the situation which is what I am recommending at this point based on what we see. This will take the place of the Bactrim and can have him discontinue the Bactrim the only thing is he is getting need to stop taking the hydroxyzine if he has been taking it that is the Vistaril and he tells me that he only takes this when he itches he has not been taking it recently. 6/7; patient arrives in clinic today with the wound on the right foot actually looking some better the area on the left lateral looking about the same. Problematically, he is having edema fluid leakage on the medial part of the left foot and ankle causing skin breakdown but no open wound. He has been very resistant to the notion of compression wraps. He has been using Santyl on both wound areas at home changing the dressing himself Partway through our visit today it became clear he had had a fall on Sunday 3 days ago. Since then he has had a lot of problems with his right shoulder 11-23-2021 upon evaluation today patient appears to be doing well with regard to his wound. Has been tolerating dressing changes without complication. Fortunately there does not appear to be any signs of infection he was wrapped last week when Dr. Dellia Nims saw him on the left. He was having some breakdown medially which they were concerned about. He did keep the wrap on until Monday but states it got extremely wet. For that reason I am thinking of doing a nurse visit on Friday so that we can get some of the swelling down right now by Friday we should have a lot of that out that we can switch out and put on a fresh dressing to get him through till next Wednesday. He is in agreement with giving that a try. 11-30-2021 upon evaluation today patient actually appears to be doing better with regard  to the overall appearance of his wound. Fortunately there does not CASSIE, HENKELS ([id number]) [id number] Page 8 of  10 appear to be any signs of active infection locally or systemically which is great news. With that being said he has been tolerating the dressing changes without complication other than the fact that from Wednesday to Friday he did well with the compression wrap from Friday till yesterday he tells me the drainage was so bad and the smell was so bad he had to take this off. It does appear that he is very macerated around the lower portion down around the heel and I do think that this is an issue. Unfortunately we cannot get home health to help with this currently. 12-07-2021 upon evaluation today patient appears to be doing well currently in regard to his wound. He is actually showing signs of excellent improvement which is great news. I am very pleased with where things stand. I do think that the progress is being made. With that being said the patient is tolerating the Santyl quite well and I think is doing an awesome job for him. 12-14-2021 upon evaluation today patient appears to be doing well with regard to his wounds he is making progress is still draining a lot however which I completely understand. With that being said fortunately there does not appear to be any signs of active infection locally or systemically at this time. 12-21-2021 upon evaluation today patient appears to be doing well currently in regard to his wound. He is actually showing signs of significant improvement this is slow but nonetheless week by week we are seeing this improve on the lateral aspect of his foot. The measurements are little bit better but more importantly the wound surface is actually a whole lot cleaner. The patient is pleased to hear this. With that being said he tells me that it is really hard for him to see exactly what all is going on. Nonetheless I am very pleased with all the new red tissue that were seeing granulating and hopefully we get this clean shortly and we can even switch to a different dressing to  try to get it to close. 12-28-2021 upon evaluation today patient appears to be doing well currently in regard to his wound is actually showing signs of improvement we are going to perform some debridement today to clearway some of the necrotic debris. Overall I think that we are at the point where we do not have to use Santyl over the home wound I think using it just on the upper portion may be appropriate if we can get this completely cleaned away. 7/6; substantial wound on the left lateral foot. Using Santyl and Hydrofera Blue there is been good improvement since the last time I saw this. The patient does not tolerate compression wraps therefore he is using Ace wraps. Fortunately he appears to be doing this fairly well 01-11-22 upon evaluation today patient appears to be doing better in regard to his wound in general though he is having some issues here with blue-green drainage. I am going to go ahead and see about getting him sent in a prescription for gentamicin cream. 01-18-2022 upon evaluation today patient actually appears to be doing quite well in regards to his wound there green drainage we will previously done with has improved. Fortunately I do not see any evidence of active infection locally or systemically which is great news. No fever chills noted 02-01-2022 upon evaluation today patient appears to be doing  well with regard to his wound. Has been tolerating the dressing changes without complication. Fortunately I see no signs of active infection locally or systemically at this time which is great news. 02-15-2022 upon evaluation today patient appears to be doing excellent in regard to his wound he is actually showing signs of good improvement. This wound is looking much better and in fact were getting close to not even needing the Santyl any longer. Overall though I think is still good to be helpful with the more proximal portion of the wound. 03-01-2022 upon evaluation today patient appears to  be doing well with regard to his ulcer although there is some irritation in the skin around. I do believe this is in a large part due to drainage and I discussed that with him today. I do think that we need to try to see what we can do to try to prevent this from being an ongoing issue going forward. 03-15-2022 upon evaluation today patient unfortunately is having increased discomfort and pain. It seems like this is likely due to infection based on what I am seeing the wound is also measuring a little bit larger than what it has been in the past. Unfortunately I am seeing some signs of the patient likely has been having increased drainage she also has the [facility] ready not the ready transfer which also does not allow anything to drain completely through to an outside dressing I think this can be a little bit of an issue as well. 03-22-2022 upon evaluation today patient's foot actually seems to be doing significantly better which is great news. Fortunately I do not see any signs of active infection locally or systemically which is great news and overall I am extremely pleased with where things stand currently. 04-05-2022 upon evaluation today patient appears to be doing well currently in regard to his wound. He is actually been making good progress here and in fact in the 2 weeks since I last saw him he has significant improvement which is great news. Fortunately I do not see any evidence of infection locally or systemically which is great news. No fevers, chills, nausea, vomiting, or diarrhea. 05-03-2022 upon evaluation today patient appears to be doing better in regard to his wound compared to 2 weeks ago he showed me pictures of what it looked like at that time it appears that he was likely infected. He started to take some of the leftover antibiotics that he had from previous with the Levaquin and has had about 7 to 9 days worth he states that he ran out of this today. Nonetheless he tells me  it is much better and is not hurting nearly as bad and overall is doing significantly improved compared to what he was dealing with previous. Overall I am extremely pleased with that aspect of things. Nonetheless I am sorry to hear this was giving him such trouble. Objective Constitutional Well-nourished and well-hydrated in no acute distress. Vitals Time Taken: 10:51 AM, Height: 66 in, Weight: 184 lbs, BMI: 29.7, Temperature: 98.6 F, Pulse: 73 bpm, Respiratory Rate: 18 breaths/min, Blood Pressure: 153/73 mmHg. Respiratory normal breathing without difficulty. Psychiatric this patient is able to make decisions and demonstrates good insight into disease process. Alert and Oriented x 3. pleasant and cooperative. General Notes: Upon inspection patient's wound bed actually showed signs of some necrotic tissue however he is in exquisite pain he still may be somewhat infected. For that reason he did not want me to perform any debridement  and I was very careful with examination. I am going to go ahead and see about sending in a prescription for him for the Levaquin just to continue the treatment that he had already started at home. Integumentary (Hair, Skin) Wound #6 status is Open. Original cause of wound was Gradually Appeared. The date acquired was: 07/25/2021. The wound has been in treatment 28 weeks. The wound is located on the Left,Lateral Foot. The wound measures 5.5cm length x 7.3cm width x 0.2cm depth; 31.534cm^2 area and 6.307cm^3 volume. There is Fat Layer (Subcutaneous Tissue) exposed. There is no tunneling or undermining noted. There is a large amount of serosanguineous drainage noted. The PRAKASH, KIMBERLING ([id number]) [id number] Page 9 of 10 wound margin is distinct with the outline attached to the wound base. There is large (67-100%) red, pink, hyper - granulation within the wound bed. There is a small (1-33%) amount of necrotic tissue within the wound bed  including Adherent Slough. The periwound skin appearance had no abnormalities noted for texture. The periwound skin appearance had no abnormalities noted for moisture. The periwound skin appearance did not exhibit: Atrophie Blanche, Cyanosis, Ecchymosis, Hemosiderin Staining, Mottled, Pallor, Rubor, Erythema. Periwound temperature was noted as No Abnormality. 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 - with Jeri Cos, PA Wednesday (Dr. Dellia Nims covering) Other: - Continue taking Levaquin Prism:Supplies Anesthetic: (In clinic) Topical Lidocaine 5% applied to wound bed Cellular or Tissue Based Products: Cellular or Tissue Based Product Type: - Will run IVR for Apligraf= $295 CoPay, $3400 out of pocket with $1440 met. Bathing/ Shower/ Hygiene: May shower and wash wound with soap and water. - wash with antibacterial soap when changing dressing Edema Control - Lymphedema / SCD / Other: Elevate legs to the level of the heart or above for 30 minutes daily and/or when sitting, a frequency of: - throughout the day Patient to wear own compression stockings every day. - Use stocking daily to right leg Moisturize legs daily. - Eucerin (in jar) Additional Orders / Instructions: Follow Nutritious Diet The following medication(s) was prescribed: levofloxacin oral 500 mg tablet 1 1 tablet oral once daily x 30 days starting 05/03/2022 WOUND #6: - Foot Wound Laterality: Left, Lateral Cleanser: Soap and Water Every Other Day/30 Days Discharge Instructions: May shower and wash wound with  dial antibacterial soap and water prior to dressing change. Peri-Wound Care: Triamcinolone 15 (g) Every Other Day/30 Days Discharge Instructions: Use triamcinolone 15 (g) as directed Peri-Wound Care: Zinc Oxide Ointment 30g tube Every Other Day/30 Days Discharge Instructions: Apply Zinc Oxide to periwound with each dressing change Topical: Gentamicin Every Other Day/30 Days Discharge Instructions: As directed by physician Prim Dressing: Hydrofera Blue Ready Foam, 4x5 in Every Other Day/30 Days ary Discharge Instructions: Apply to wound bed as instructed Prim Dressing: Santyl Ointment Every Other Day/30 Days ary Discharge Instructions: apply nickel thick amount just to sloughy/yellow areas Secondary Dressing: ABD Pad, 8x10 Every Other Day/30 Days Discharge Instructions: Apply over primary dressing as directed. Secured With: Coban Self-Adherent Wrap 4x5 (in/yd) Every Other Day/30 Days Discharge Instructions: Secure with Coban as directed. Secured With: [employer], 4.5x3.1 (in/yd) (Generic) Every Other Day/30  Days Discharge Instructions: Secure with Kerlix as directed. Secured With: 68M Medipore Public affairs consultant Surgical T 2x10 (in/yd) (Generic) Every Other Day/30 Days ape Discharge Instructions: Secure with tape as directed. Com pression Wrap: ACE Wrap Every Other Day/30 Days Discharge Instructions: For Compression 1. I am going to recommend that we have the patient go ahead and continue with the recommendation for using the Levaquin he had some of that already has been taking him and continue that for a bit longer. 2. I am also going to recommend that we have the patient continue with the Santyl debriding the necrotic areas he will subsequently be using the gentamicin over the other areas. 3. He is going to continue with the [facility] to cover and then the ABD pad and roll gauze to secure in place. We will see patient back for reevaluation in 1 week here in the clinic. If anything  worsens or changes patient will contact our office for additional recommendations. Electronic Signature(s) Signed: 05/03/2022 1:57:57 PM By: [name] PA-C Previous Signature: 05/03/2022 1:55:32 PM Version By: [name] PA-C Entered By: [name] on 05/03/2022 13:57:57 [name] ([id number]) [id number] Page 10 of 10 -------------------------------------------------------------------------------- SuperBill Details Patient Name: Date of Service: CHRISTROPHER, GINTZ [place] LD C. 05/03/2022 Medical Record Number: [id number] Patient Account Number: [id number] Date of Birth/Sex: Treating RN: 1940/12/17 (81 y.o. 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 L97.522 Non-pressure chronic ulcer of other part of left foot with fat layer exposed L97.512 Non-pressure chronic ulcer of other part of right foot with fat layer exposed E11.622 Type 2 diabetes mellitus with other skin ulcer Physician Procedures : CPT4 Code Description Modifier [id number] - WC PHYS LEVEL 4 - EST PT ICD-10 Diagnosis Description I87.333 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 extremity I89.0 Lymphedema, not elsewhere classified  L95.8 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

## 2022-05-04 NOTE — Progress Notes (Signed)
RODRIGUS, KILKER ([id number]) [id number] Page 1 of 5 Visit Report for 05/03/2022 Arrival Information Details Patient Name: Date of Service: [place], [name] 05/03/2022 10:15 A M Medical Record Number: [id number] Patient Account Number: [id number] Date of Birth/Sex: Treating RN: 02-08-1941 (81 y.o. M) Primary Care Jazsmine Macari: [name] Other Clinician: Referring Shaw Dobek: Treating Clemon Devaul/Extender: Azucena Kuba in Treatment: 28 Visit Information History Since Last Visit Added or deleted any medications: No Patient Arrived: Ambulatory Any new allergies or adverse reactions: No Arrival Time: 10:50 Had a fall or experienced change in No Accompanied By: self activities of daily living that may affect Transfer Assistance: None risk of falls: Patient Identification Verified: Yes Signs or symptoms of abuse/neglect since last visito No Secondary Verification Process Completed: Yes Hospitalized since last visit: No Patient Requires Transmission-Based Precautions: No Implantable device outside of the clinic excluding No Patient Has Alerts: No cellular tissue based products placed in the center since last visit: Has Dressing in Place as Prescribed: Yes Has Compression in Place as Prescribed: Yes Pain Present Now: Yes Electronic Signature(s) Signed: 05/03/2022 4:51:51 PM By: Erenest Blank Entered By: Erenest Blank on 05/03/2022 10:51:01 -------------------------------------------------------------------------------- Lower Extremity Assessment Details Patient Name: Date of Service: [place], [name] 05/03/2022 10:15 A M Medical Record Number: [id number] Patient Account Number: [id number] Date of Birth/Sex: Treating RN: 06-01-1941 (81 y.o. M) Primary Care Corinthian Kemler: [name] Other Clinician: Referring Tanya Crothers: Treating Milagros Middendorf/Extender: Azucena Kuba in Treatment: 28 Edema  Assessment Assessed: [Left: No] [Right: No] Edema: [Left: Ye] [Right: s] Calf Left: Right: Point of Measurement: 32 cm From Medial Instep 38.5 cm Ankle Left: Right: Point of Measurement: 10 cm From Medial Instep 25 cm Electronic Signature(s) Signed: 05/03/2022 4:51:51 PM By: Erenest Blank Entered By: Erenest Blank on 05/03/2022 10:57:41 [name] ([id number]) [id number] Page 2 of 5 -------------------------------------------------------------------------------- Multi-Disciplinary Care Plan Details Patient Name: Date of Service: [place], [name] 05/03/2022 10:15 A M Medical Record Number: [id number] Patient Account Number: [id number] Date of Birth/Sex: Treating RN: 02-15-1941 (81 y.o. Burnadette Pop, Lauren Primary Care Kenley Troop: [name] Other Clinician: Referring Prashant Glosser: Treating Adeel Guiffre/Extender: Azucena Kuba in Treatment: 28 Active Inactive Venous Leg Ulcer Nursing Diagnoses: Actual venous Insuffiency (use after diagnosis is confirmed) Goals: Patient will maintain optimal edema control Date Initiated: 10/19/2021 Target Resolution Date: 05/04/2022 Goal Status: Active Interventions: Assess peripheral edema status every visit. Compression as ordered Notes: 11/16/21: Edema not controlled, patient not compliant with compression. 12/14/21:Edema not well controlled, patient will not allow compression wraps. 02/15/22: Edema better controlled, patient using ACE wrap Wound/Skin Impairment Nursing Diagnoses: Impaired tissue integrity Goals: Patient/caregiver will verbalize understanding of skin care regimen Date Initiated: 10/19/2021 Target Resolution Date: 05/04/2022 Goal Status: Active Ulcer/skin breakdown will have a volume reduction of 30% by week 4 Date Initiated: 10/19/2021 Date Inactivated: 12/14/2021 Target Resolution Date: 12/14/2021 Unmet Reason: infection, non Goal Status: Unmet  compliance Interventions: Assess patient/caregiver ability to obtain necessary supplies Assess patient/caregiver ability to perform ulcer/skin care regimen upon admission and as needed Assess ulceration(s) every visit Provide education on ulcer and skin care Treatment Activities: Topical wound management initiated : 10/19/2021 Notes: 11/16/21: Wound care continues, patient not compliant with edema control. 02/15/22: Wound care regimen continues. Large co-pay for skin subs. Electronic Signature(s) Signed: 05/03/2022 3:58:15 PM By: Rhae Hammock RN Entered By: Rhae Hammock on 05/03/2022 11:11:08 [name] ([id number]) [id number] Page 3 of 5 --------------------------------------------------------------------------------  Pain Assessment Details Patient Name: Date of Service: [place], [name] 05/03/2022 10:15 A M Medical Record Number: [id number] Patient Account Number: [id number] Date of Birth/Sex: Treating RN: [birth year] (81 y.o. M) Primary Care Shateka Petrea: [name] Other Clinician: Referring Jarom Govan: Treating Adriel Kessen/Extender: Azucena Kuba in Treatment: 28 Active Problems Location of Pain Severity and Description of Pain Patient Has Paino Yes Site Locations Rate the pain. Current Pain Level: 4 Pain Management and Medication Current Pain Management: Electronic Signature(s) Signed: 05/03/2022 4:51:51 PM By: Erenest Blank Entered By: Erenest Blank on 05/03/2022 10:51:30 -------------------------------------------------------------------------------- Patient/Caregiver Education Details Patient Name: Date of Service: [name] 11/22/2023andnbsp10:15 A M Medical Record Number: [id number] Patient Account Number: [id number] Date of Birth/Gender: Treating RN: 1940/10/27 (81 y.o. E[place] Noe Primary Care Physician: [name] Other Clinician: Referring Physician: Treating  Physician/Extender: Azucena Kuba in Treatment: 28 Education Assessment Education Provided To: Patient Education Topics Provided Wound/Skin Impairment: Methods: Explain/Verbal Responses: Reinforcements needed, State content correctly Electronic Signature(s) Signed: 05/03/2022 3:58:15 PM By: Rhae Hammock RN Entered By: Rhae Hammock on 05/03/2022 11:11:28 [name] ([id number]) [id number] Page 4 of 5 -------------------------------------------------------------------------------- Wound Assessment Details Patient Name: Date of Service: [place], [name] 05/03/2022 10:15 A M Medical Record Number: [id number] Patient Account Number: [id number] Date of Birth/Sex: Treating RN: [birth year] (81 y.o. M) Primary Care Kenny Rea: [name] Other Clinician: Referring Jordanny Waddington: Treating Richell Corker/Extender: Azucena Kuba in Treatment: 28 Wound Status Wound Number: 6 Primary Vasculitis Etiology: Wound Location: Left, Lateral Foot Wound Open Wounding Event: Gradually Appeared Status: Date Acquired: 07/25/2021 Comorbid Anemia, Chronic Obstructive Pulmonary Disease (COPD), Weeks Of Treatment: 28 History: Hypertension, Peripheral Venous Disease, Type II Diabetes, Clustered Wound: No Osteoarthritis, Neuropathy Photos Wound Measurements Length: (cm) 5.5 Width: (cm) 7.3 Depth: (cm) 0.2 Area: (cm) 31.534 Volume: (cm) 6.307 % Reduction in Area: -61.9% % Reduction in Volume: -61.9% Epithelialization: Small (1-33%) Tunneling: No Undermining: No Wound Description Classification: Full Thickness Without Exposed Suppor Wound Margin: Distinct, outline attached Exudate Amount: Large Exudate Type: Serosanguineous Exudate Color: red, brown t Structures Foul Odor After Cleansing: No Slough/Fibrino Yes Wound Bed Granulation Amount: Large (67-100%) Exposed Structure Granulation Quality: Red,  Pink, Hyper-granulation Fascia Exposed: No Necrotic Amount: Small (1-33%) Fat Layer (Subcutaneous Tissue) Exposed: Yes Necrotic Quality: Adherent Slough Tendon Exposed: No Muscle Exposed: No Joint Exposed: No Bone Exposed: No Periwound Skin Texture Texture Color No Abnormalities Noted: Yes No Abnormalities Noted: No Atrophie Blanche: No Moisture Cyanosis: No No Abnormalities Noted: Yes Ecchymosis: No Erythema: No Hemosiderin Staining: No Mottled: No Pallor: No Rubor: No Temperature / Pain BENJAMEN, KOELLING ([id number]) [id number] Page 5 of 5 Temperature: No Abnormality Electronic Signature(s) Signed: 05/03/2022 4:51:51 PM By: Erenest Blank Entered By: Erenest Blank on 05/03/2022 10:58:31 -------------------------------------------------------------------------------- Vitals Details Patient Name: Date of Service: [name], [name] 05/03/2022 10:15 A M Medical Record Number: [id number] Patient Account Number: [id number] Date of Birth/Sex: Treating RN: [birth year] (81 y.o. M) Primary Care Kayleb Warshaw: [name] Other Clinician: Referring Desiree Fleming: Treating Verdelle Valtierra/Extender: Azucena Kuba in Treatment: 28 Vital Signs Time Taken: 10:51 Temperature (F): 98.6 Height (in): 66 Pulse (bpm): 73 Weight (lbs): 184 Respiratory Rate (breaths/min): 18 Body Mass Index (BMI): 29.7 Blood Pressure (mmHg): 153/73 Reference Range: 80 - 120 mg / dl Electronic Signature(s) Signed: 05/03/2022 4:51:51 PM By: Erenest Blank Entered By: Erenest Blank on 05/03/2022 10:51:20

## 2022-05-17 ENCOUNTER — Encounter (HOSPITAL_BASED_OUTPATIENT_CLINIC_OR_DEPARTMENT_OTHER): Payer: Medicare HMO | Attending: Physician Assistant | Admitting: Physician Assistant

## 2022-05-17 DIAGNOSIS — I10 Essential (primary) hypertension: Secondary | ICD-10-CM | POA: Insufficient documentation

## 2022-05-17 DIAGNOSIS — Z9660 Presence of unspecified orthopedic joint implant: Secondary | ICD-10-CM | POA: Insufficient documentation

## 2022-05-17 DIAGNOSIS — M5440 Lumbago with sciatica, unspecified side: Secondary | ICD-10-CM | POA: Diagnosis not present

## 2022-05-17 DIAGNOSIS — E11622 Type 2 diabetes mellitus with other skin ulcer: Secondary | ICD-10-CM | POA: Diagnosis not present

## 2022-05-17 DIAGNOSIS — L97522 Non-pressure chronic ulcer of other part of left foot with fat layer exposed: Secondary | ICD-10-CM | POA: Insufficient documentation

## 2022-05-17 DIAGNOSIS — E785 Hyperlipidemia, unspecified: Secondary | ICD-10-CM | POA: Insufficient documentation

## 2022-05-17 DIAGNOSIS — L97512 Non-pressure chronic ulcer of other part of right foot with fat layer exposed: Secondary | ICD-10-CM | POA: Insufficient documentation

## 2022-05-17 DIAGNOSIS — L958 Other vasculitis limited to the skin: Secondary | ICD-10-CM | POA: Diagnosis not present

## 2022-05-17 DIAGNOSIS — I890 Lymphedema, not elsewhere classified: Secondary | ICD-10-CM | POA: Insufficient documentation

## 2022-05-17 DIAGNOSIS — Z87891 Personal history of nicotine dependence: Secondary | ICD-10-CM | POA: Diagnosis not present

## 2022-05-17 DIAGNOSIS — E11621 Type 2 diabetes mellitus with foot ulcer: Secondary | ICD-10-CM | POA: Diagnosis not present

## 2022-05-17 DIAGNOSIS — I87333 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 extremity: Secondary | ICD-10-CM | POA: Insufficient documentation

## 2022-05-17 DIAGNOSIS — Z9889 Other specified postprocedural states: Secondary | ICD-10-CM | POA: Insufficient documentation

## 2022-05-17 NOTE — Progress Notes (Addendum)
[name] Fernandez, [name] Fernandez ([id number]) [id number] Page 1 of 10 Visit Report for 05/17/2022 Chief Complaint Document Details Patient Name: Date of Service: [name] Roy, [name] 05/17/2022 10:15 A M Medical Record Number: [id number] Patient Account Number: [id number] Date of Birth/Sex: Treating RN: 12-01-1940 (81 y.o. M) Primary Care Provider: Kirtland Fernandez Other Clinician: Referring Provider: Treating Provider/Extender: [name] Fernandez in Treatment: 30 Information Obtained from: Patient Chief Complaint Bilateral foot ulcers Electronic Signature(s) Signed: 05/17/2022 10:06:54 AM By: [name] PA-C Entered By: [name] on 05/17/2022 10:06:54 -------------------------------------------------------------------------------- HPI Details Patient Name: Date of Service: [name], [name] 05/17/2022 10:15 A M Medical Record Number: [id number] Patient Account Number: [id number] Date of Birth/Sex: Treating RN: 06/16/1940 (81 y.o. M) Primary Care Provider: Kirtland Fernandez Other Clinician: Referring Provider: Treating Provider/Extender: [name] Fernandez in Treatment: 37 History of Present Illness HPI Description: 81 year old gentleman known to have swelling of his left lower extremity with some ulceration along the medial ankle has been having these problems for the last 3-4 months and does not know how it came on. No history of injury or no history of any infection in this area. He is known to have diabetes mellitus controlled with diet, hyperlipidemia, hypertension and chronic lumbar back pain with sciatica which is being treated by his PCP. Last hemoglobin A1c was 6.3 in June 2017. Last medical history is significant for esophageal stricture, hiatal hernia, vitamin D deficiency, status post back surgery 3, hernia repair as a child for both groins, hiatal hernia repair, joint replacement and revision of a knee  surgery on the right side. He has quit smoking in 1982. He has never had a Doppler study of his lower extremity except remotely when he had knee surgery they looked for a blood clot on the right side. 09/13/2016 -- venous reflux study done on 09/12/2016 showed there is evidence of greater saphenous vein reflux in the left lower extremity and the small saphenous vein and the posterior thigh is not competent. 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 With these results he definitely needs a referral to the vascular surgeons 09/20/2016 -- he has an appointment to see the vascular surgeons on May 1 10/03/2016 -- patient had a 4 layer compression on his left lower extremity and had a lot of discomfort and pain. 10/11/2016-- was seen by Dr. Althea [name] -- review of his data he recommended staged laser ablation of his great and then small saphenous veins for reduction odd of his venous hypertension. He did examine his right leg with the SonoSite and he has dilatation of the saphenous vein on the right lower extremity too. Since there was no evidence of ulceration he recommended observation of the right lower extremity. He recommended to continue with local wound care at the wound center until his wound was completely healed. 10/18/2016 -- the patient has not been very compliant with his compression, his diet and his diuretics and as a result his lymphedema has increased markedly 10/25/2016 -- he has been compliant this week, has worn his 2 press compression wraps, taken his diuretics and is watching his salt intake. 11/08/2016 -- he had his venous procedure done last week and details of this have been reported and reviewed in his electronic medical record. he had a laser ablation of his great saphenous vein and this was done from mid calf to just below the saphenofemoral junction. He would return next  week for ultrasound follow-up. He will then undergo the small saphenous vein ablation in a  few weeks. 11/15/2016 -- his postprocedure visit showed good closure of his left great saphenous vein from the mid calf to 1-1/2 cm from the saphenofemoral junction. He had excellent early results from the ablation. The ablation of the left small saphenous vein was planned in a week 12/06/2016 -- he had his small saphenous vein venous ablation a week before and the duplex showed closure of his small saphenous vein to within half centimeter office saphenous popliteal junction with no DVT and he was pleased with the results. He recommended continue with elevation and compression and to see them back on an as-needed basis 12/19/16; the patient's wound is actually closed. Sometime over the weekend he developed a small abrasion on the lower calf just above the original wound on [place] ([id number]) [id number] Page 2 of 10 the left medial malleolus although this is closed as well Readmission: 01/18/18 on evaluation today patient presents after having last been seen in our clinic July 2018. Subsequently he is a reoccurrence of the ulcers on the left ankle both medial and now a new area lateral that had been present back room for about a month he tells me. He states that this has done very well for about a year he really does not know of any injury or anything that happened that would have caused this reopening at this point. He has continued to wear compression stockings which is appropriate. He does not have lymphedema pumps. He has continued to also keep the area clean and dry as best he could. With that being said now that is been reopened is been harder for him to take care of this as far as compression stockings are concerned keeping them clean along with continuing to manage his fluid and swelling. His ABI appears to be great on the left registering at 1.14 today. He has previously undergone a venous ablation. Other than compression/lymphedema pumps he's really done  everything that he can to help manage and prevent these ulcers from forming. 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 01/30/18 on evaluation today patient appears to actually be doing very well in regard to his medial ankle ulcer. It's the lateral ulcer that actually is not doing quite as well today. Fortunately he did have some alginate left ovary start using this on the wounds and the medial ankle actually seems to be doing much better. The lateral ankle does actually need something to con a help with slough and buildup. The collagen really did not seem to do much for him in that regard. 02/13/18 on evaluation today patient appears to be doing about the same in regard to his ulcers. Unfortunately I do not feel like were making good progress at this time. When I have debrided the wound the slough seems to come back fairly quickly in the lateral ankle ulcer. There does not appear to be any evidence of infection at this time. No fevers, chills, nausea, or vomiting noted at this time. 02/27/18 on evaluation today patient actually appears to be showing some signs of improvement in regard to both wound areas. I'm insurance on the medial aspect of his left lower extremity at the ankle whether or not the alginate may be sticking and causing some tearing off of new skin attempting to grow. With that being said the patient states it does not seem to be doing such just pulls up some of the dried dead skin  around. Nonetheless this is something I wanted to watch out for going forward and actually I recommended that he take the dressing off in the shower when you could get it completely wet before removal to see how this does. Subsequently in regard to the lateral ankle ulcer he still has slough noted at this point although I do believe that he is tolerating the Medihoney much better I wish you could have gotten the Santyl but unfortunately the Santyl was too expensive gonna cost him $250. 03/06/18 on evaluation today patient  appears to be doing about the same in regard to both ulcers of his left medial and lateral malleolus sites. He's been tolerating the dressing changes without complication. Unfortunately things do not seem to be improving in regard to either side at this time. Overall I think we may need to switch things up a little bit as far as the way we are performing the dressing changes currently. 03/27/18 on evaluation today patient's wound bed actually appears to be doing much better at both locations in regard to his ankle ulcers. He has been tolerating the dressing changes at this time without complication. Overall I'm very pleased with how things appear. The patient likewise states that he's much better in regard to discomfort at this time. 04/10/18 on evaluation today patient actually appears to be doing well in regard to his left lateral ankle ulcer. He has been tolerating the dressing changes without complication. Fortunately there does not appear to be any evidence of infection. Overall I do feel like he is tolerating the Medihoney without complication. 04/24/18 upon evaluation today patient's wound actually appears to be healed on the lateral aspect of his ankle the medial aspect still continues to remain close without any evidence of complication at this time. Overall I have been extremely pleased with how things have progressed up to this point. The patient likewise is very happy he's not having any significant pain this time. He does wears compression stocking on a regular basis. Readmission: 10-19-2021 upon evaluation today patient presents for evaluation here in the clinic concerning issues with a left lateral ulcer as well as a right lateral ulcer both along the aspect of his foot getting close to the ankle. This left area is the same place that I treated back in 2024. 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 This appears to be very inflamed and is also  very painful. In the past he has been completely unable to tolerate the use of any compression wraps unfortunately. There does not appear to be any evidence of active infection locally nor systemically at this time which is good news. I do believe however we need to try to see what we can do to calm down the inflammation I think triamcinolone topically as well as oral prednisone would likely be indicated in this case. Patient's medical history really has not changed significantly since I last saw him in actually 2019 though I think is stated 2020 above. 10-26-21 upon evaluation today patient appears to still be having a lot of irritation and inflammation in regard to the ankles laterally. The left is greater than the right. With that being said I do believe that he would benefit from a biopsy to confirm whether or not this may indeed be vasculitis. It actually seems like that physically and on examination but again I want to be sure that we are on the right track as far as treatment  is concerned. The good news is he did not have a DVT I called him about that on Friday this was all in regard to the right leg. Overall I am very pleased in that regard. 11-02-2021 upon evaluation today patient appears to be doing poorly still in regard to his left lower extremity. Apparently he had a fever on Sunday which was around 101 and he was severely sick feeling and disoriented according to his wife. She came with him today because she did not think that he would actually tell me what was going on. With that being said unfortunately he did not go to the hospital he actually has been doing a little better over the past few days he been taking amoxicillin he has not taken any other medications orally at this point that are different. He has not been on any antibiotics either more recently other than the amoxicillin. Nonetheless he has not had a repeat in the past 3 days of that issue. 11-09-2021 upon evaluation today  patient presents after having had quite an ordeal over the past several days. Subsequently yesterday I got a call from [facility] here in [place] concerning the patient and the fact that he was having bleeding that been going on for the past 24 hours and was not stopping. Subsequently my advice was to have the patient go to the ER for further evaluation and treatment. It seems to me that he had a regular way part of the wound down to the point that a blood vessel and open. Apparently he was filling up trash bags wrapped around his foot with blood. In fact his lab review which I did do him as well today showed that he went from a white hemoglobin on 09-14-2021 of 13.6 down to a hemoglobin of 11.2 on 11-08-2021 this was yesterday. Subsequently this is due to the amount of blood loss that he has had acutely. Again I do not believe he is at transfusion stage but at the same time he is extremely weak and states that a couple times when he is going to stand up he is actually fallen back into his chair. Nonetheless I do believe that he probably needs to supplement with iron and I did recommend that there are some over-the-counter products he could get in this regard. Subsequently also suggested that the patient needs to be drinking plenty of water he apparently drinks Coke and he drinks coffee but that is about it. Subsequently following this conversation his wife was present during this time as well and I do think that he is going to try to drink more I think it is of utmost importance. With that being said I did review his PCR culture as well it was positive for 2 organisms. This was Pseudomonas and Enterobacter. Both were showing up as being very prevalent in the PCR culture and subsequently Cipro will take care of the situation which is what I am recommending at this point based on what we see. This will take the place of the Bactrim and can have him discontinue the Bactrim the only thing is he is getting need to  stop taking the hydroxyzine if he has been taking it that is the Vistaril and he tells me that he only takes this when he itches he has not been taking it recently. 6/7; patient arrives in clinic today with the wound on the right foot actually looking some better the area on the left lateral looking about the same. Problematically, he is  having edema fluid leakage on the medial part of the left foot and ankle causing skin breakdown but no open wound. He has been very resistant to the notion of compression wraps. He has been using Santyl on both wound areas at home changing the dressing himself Partway through our visit today it became clear he had had a fall on Sunday 3 days ago. Since then he has had a lot of problems with his right shoulder 11-23-2021 upon evaluation today patient appears to be doing well with regard to his wound. Has been tolerating dressing changes without complication. Fortunately there does not appear to be any signs of infection he was wrapped last week when Dr. Dellia Nims saw him on the left. He was having some breakdown medially which they were concerned about. He did keep the wrap on until Monday but states it got extremely wet. For that reason I am thinking of doing a nurse visit on Friday so that we can get some of the swelling down right now by Friday we should have a lot of that out that we can switch out and put on a fresh dressing to get him through till next Wednesday. He is in agreement with giving that a try. [name] Fernandez, [name] Fernandez ([id number]) [id number] Page 3 of 10 11-30-2021 upon evaluation today patient actually appears to be doing better with regard to the overall appearance of his wound. Fortunately there does not appear to be any signs of active infection locally or systemically which is great news. With that being said he has been tolerating the dressing changes without complication other than the fact that from Wednesday to Friday he did well  with the compression wrap from Friday till yesterday he tells me the drainage was so bad and the smell was so bad he had to take this off. It does appear that he is very macerated around the lower portion down around the heel and I do think that this is an issue. Unfortunately we cannot get home health to help with this currently. 12-07-2021 upon evaluation today patient appears to be doing well currently in regard to his wound. He is actually showing signs of excellent improvement which is great news. I am very pleased with where things stand. I do think that the progress is being made. With that being said the patient is tolerating the Santyl quite well and I think is doing an awesome job for him. 12-14-2021 upon evaluation today patient appears to be doing well with regard to his wounds he is making progress is still draining a lot however which I completely understand. With that being said fortunately there does not appear to be any signs of active infection locally or systemically at this time. 12-21-2021 upon evaluation today patient appears to be doing well currently in regard to his wound. He is actually showing signs of significant improvement this is slow but nonetheless week by week we are seeing this improve on the lateral aspect of his foot. The measurements are little bit better but more importantly the wound surface is actually a whole lot cleaner. The patient is pleased to hear this. With that being said he tells me that it is really hard for him to see exactly what all is going on. Nonetheless I am very pleased with all the new red tissue that were seeing granulating and hopefully we get this clean shortly and we can even switch to a different dressing to try to get it to close. 12-28-2021 upon evaluation today  patient appears to be doing well currently in regard to his wound is actually showing signs of improvement we are going to perform some debridement today to clearway some of the  necrotic debris. Overall I think that we are at the point where we do not have to use Santyl over the home wound I think using it just on the upper portion may be appropriate if we can get this completely cleaned away. 7/6; substantial wound on the left lateral foot. Using Santyl and Hydrofera Blue there is been good improvement since the last time I saw this. The patient does not tolerate compression wraps therefore he is using Ace wraps. Fortunately he appears to be doing this fairly well 01-11-22 upon evaluation today patient appears to be doing better in regard to his wound in general though he is having some issues here with blue-green drainage. I am going to go ahead and see about getting him sent in a prescription for gentamicin cream. 01-18-2022 upon evaluation today patient actually appears to be doing quite well in regards to his wound there green drainage we will previously done with has improved. Fortunately I do not see any evidence of active infection locally or systemically which is great news. No fever chills noted 02-01-2022 upon evaluation today patient appears to be doing well with regard to his wound. Has been tolerating the dressing changes without complication. Fortunately I see no signs of active infection locally or systemically at this time which is great news. 02-15-2022 upon evaluation today patient appears to be doing excellent in regard to his wound he is actually showing signs of good improvement. This wound is looking much better and in fact were getting close to not even needing the Santyl any longer. Overall though I think is still good to be helpful with the more proximal portion of the wound. 03-01-2022 upon evaluation today patient appears to be doing well with regard to his ulcer although there is some irritation in the skin around. I do believe this is in a large part due to drainage and I discussed that with him today. I do think that we need to try to see what we can do  to try to prevent this from being an ongoing issue going forward. 03-15-2022 upon evaluation today patient unfortunately is having increased discomfort and pain. It seems like this is likely due to infection based on what I am seeing the wound is also measuring a little bit larger than what it has been in the past. Unfortunately I am seeing some signs of the patient likely has been having increased drainage she also has the [facility] ready not the ready transfer which also does not allow anything to drain completely through to an outside dressing I think this can be a little bit of an issue as well. 03-22-2022 upon evaluation today patient's foot actually seems to be doing significantly better which is great news. Fortunately I do not see any signs of active infection locally or systemically which is great news and overall I am extremely pleased with where things stand currently. 04-05-2022 upon evaluation today patient appears to be doing well currently in regard to his wound. He is actually been making good progress here and in fact in the 2 weeks since I last saw him he has significant improvement which is great news. Fortunately I do not see any evidence of infection locally or systemically which is great news. No fevers, chills, nausea, vomiting, or diarrhea. 05-03-2022 upon evaluation today  patient appears to be doing better in regard to his wound compared to 2 weeks ago he showed me pictures of what it looked like at that time it appears that he was likely infected. He started to take some of the leftover antibiotics that he had from previous with the Levaquin and has had about 7 to 9 days worth he states that he ran out of this today. Nonetheless he tells me it is much better and is not hurting nearly as bad and overall is doing significantly improved compared to what he was dealing with previous. Overall I am extremely pleased with that aspect of things. Nonetheless I am sorry to hear  this was giving him such trouble. 05-17-2022 upon evaluation today patient appears to be doing well currently in regard to his wound in the distal portion of the wound unfortunately the proximal portion actually is irritating and causing much more discomfort at this point. Fortunately I do not see any signs of infection which is great news. No fevers, chills, nausea, vomiting, or diarrhea. Electronic Signature(s) Signed: 05/17/2022 11:11:18 AM By: [name] PA-C Entered By: [name] on 05/17/2022 11:11:18 -------------------------------------------------------------------------------- Physical Exam Details Patient Name: Date of Service: [place], [name] 05/17/2022 10:15 A M Medical Record Number: [id number] Patient Account Number: [id number] Date of Birth/Sex: Treating RN: 1941/01/23 (81 y.o. M) Primary Care Provider: Kirtland Fernandez Other Clinician: Referring Provider: Treating Provider/Extender: [name] Fernandez in Treatment: [address] [place] ([id number]) [id number] Page 4 of 10 Constitutional Well-nourished and well-hydrated in no acute distress. Respiratory normal breathing without difficulty. Psychiatric this patient is able to make decisions and demonstrates good insight into disease process. Alert and Oriented x 3. pleasant and cooperative. Notes Upon inspection patient's wound bed actually showed signs again of doing fairly well were not doing any debridements at this point he is utilizing the Santyl just over the areas in question whether some necrotic tissue but the most part we will get a switch over to just using the triamcinolone in general to try to help out with the irritation and inflammation. Hopefully this is going to calm down his pain to a degree as well. Electronic Signature(s) Signed: 05/17/2022 11:11:43 AM By: [name] PA-C Entered By: [name] on 05/17/2022  11:11:43 -------------------------------------------------------------------------------- Physician Orders Details Patient Name: Date of Service: [place], [name] 05/17/2022 10:15 A M Medical Record Number: [id number] Patient Account Number: [id number] Date of Birth/Sex: Treating RN: February 19, 1941 (81 y.o. 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 - with Jeri Cos, PA Wednesday Return A Other: - Continue taking Levaquin Prism:Supplies Anesthetic (In clinic) Topical Lidocaine 5% applied to wound bed Cellular or Tissue Based Products Cellular or Tissue Based Product Type: - Will run IVR for Apligraf= $295 [employer], $3400 out of pocket with $1440 met. Bathing/ Shower/ Hygiene May shower and wash wound with soap and water. -  wash with antibacterial soap when changing dressing Edema Control - Lymphedema / SCD / Other Elevate legs to the level of the heart or above for 30 minutes daily and/or when sitting, a frequency of: - throughout the day Patient to wear own compression stockings every day. - Use stocking daily to right leg Moisturize legs daily. - Eucerin (in jar) Additional Orders / Instructions Follow Nutritious Diet Wound Treatment Wound #6 - Foot Wound Laterality: Left, Lateral Cleanser: Soap and Water Every  Other Day/30 Days Discharge Instructions: May shower and wash wound with dial antibacterial soap and water prior to dressing change. [name] Fernandez, [name] Fernandez ([id number]) [id number] Page 5 of 10 Peri-Wound Care: Triamcinolone 15 (g) Every Other Day/30 Days Discharge Instructions: Use triamcinolone 15 (g) around the wound and on top of wound bed Peri-Wound Care: Zinc Oxide Ointment 30g tube Every Other Day/30 Days Discharge Instructions: Apply Zinc Oxide to periwound with each dressing change Prim Dressing: Santyl Ointment Every Other Day/30 Days ary Discharge Instructions: apply nickel thick amount just to sloughy/yellow areas Prim Dressing: Hydrofera Blue Ready Foam, 4x5 in (DME) (Dispense As Written) Every Other Day/30 Days ary Discharge Instructions: Apply to wound bed as instructed Secondary Dressing: ABD Pad, 8x10 Every Other Day/30 Days Discharge Instructions: Apply over primary dressing as directed. Secured With: Coban Self-Adherent Wrap 4x5 (in/yd) Every Other Day/30 Days Discharge Instructions: Secure with Coban as directed. Secured With: [employer], 4.5x3.1 (in/yd) (Generic) Every Other Day/30 Days Discharge Instructions: Secure with Kerlix as directed. Secured With: 37M Medipore Public affairs consultant Surgical T 2x10 (in/yd) (Generic) Every Other Day/30 Days ape Discharge Instructions: Secure with tape as directed. Compression Wrap: ACE Wrap Every Other Day/30 Days Discharge Instructions: For Compression Electronic Signature(s) Signed: 05/17/2022 4:11:00 PM By: [name] PA-C Signed: 06/14/2022 5:35:40 PM By: Rhae Hammock RN Entered By: Rhae Hammock on 05/17/2022 10:56:55 -------------------------------------------------------------------------------- Problem List Details Patient Name: Date of Service: [place], [name] 05/17/2022 10:15 A M Medical Record Number: [id number] Patient Account Number: [id number] Date of Birth/Sex: Treating  RN: [birth year] (81 y.o. 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 10/19/2021 No Yes L97.522 Non-pressure chronic ulcer of other part of left foot with fat layer exposed 10/19/2021 No Yes L97.512 Non-pressure chronic ulcer of other part of right foot with fat layer exposed 10/19/2021 No Yes [name] Fernandez, [name] Fernandez ([id number]) [id number] Page 6 of 10 E11.622 Type 2 diabetes mellitus with other skin ulcer 10/19/2021 No Yes Inactive Problems Resolved Problems Electronic Signature(s) Signed: 05/17/2022 10:06:46 AM By: [name] PA-C Entered By: [name] on 05/17/2022 10:06:46 -------------------------------------------------------------------------------- Progress Note Details Patient Name: Date of Service: [place], [name] 05/17/2022 10:15 A M Medical Record Number: [id number] Patient Account Number: [id number] Date of Birth/Sex: Treating RN: [birth year] (80 y.o. M) Primary Care Provider: Kirtland Fernandez Other Clinician: Referring Provider: Treating Provider/Extender: [name] Fernandez in Treatment: 30 Subjective Chief Complaint Information obtained from Patient Bilateral foot ulcers History of Present Illness (HPI) 81 year old gentleman known to have swelling of his left lower extremity with some ulceration along the medial ankle has been having these problems for the last 3-4 months and does  not know how it came on. No history of injury or no history of any infection in this area. He is known to have  diabetes mellitus controlled with diet, hyperlipidemia, hypertension and chronic lumbar back pain with sciatica which is being treated by his PCP. Last hemoglobin A1c was 6.3 in June 2017. Last medical history is significant for esophageal stricture, hiatal hernia, vitamin D deficiency, status post back surgery o3, hernia repair as a child for both groins, hiatal hernia repair, joint replacement and revision of a knee surgery on the right side. He has quit smoking in 1982. He has never had a Doppler study of his lower extremity except remotely when he had knee surgery they looked for a blood clot on the right side. 09/13/2016 -- venous reflux study done on 09/12/2016 showed there is evidence of greater saphenous vein reflux in the left lower extremity and the small saphenous vein and the posterior thigh is not competent. 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 With these results he definitely needs a referral to the vascular surgeons 09/20/2016 -- he has an appointment to see the vascular surgeons on May 1 10/03/2016 -- patient had a 4 layer compression on his left lower extremity and had a lot of discomfort and pain. 10/11/2016-- was seen by Dr. Althea [name] -- review of his data he recommended staged laser ablation of his great and then small saphenous veins for reduction odd of his venous hypertension. He did examine his right leg with the SonoSite and he has dilatation of the saphenous vein on the right lower extremity too. Since there was no evidence of ulceration he recommended observation of the right lower extremity. He recommended to continue with local wound care at the wound center until his wound was completely healed. 10/18/2016 -- the patient has not been very compliant with his compression, his diet and his diuretics and as a result his lymphedema has increased markedly 10/25/2016 -- he has been compliant this week, has worn his 2 press compression wraps, taken his  diuretics and is watching his salt intake. 11/08/2016 -- he had his venous procedure done last week and details of this have been reported and reviewed in his electronic medical record. he had a laser ablation of his great saphenous vein and this was done from mid calf to just below the saphenofemoral junction. He would return next week for ultrasound follow-up. He will then undergo the small saphenous vein ablation in a few weeks. 11/15/2016 -- his postprocedure visit showed good closure of his left great saphenous vein from the mid calf to 1-1/2 cm from the saphenofemoral junction. He had excellent early results from the ablation. The ablation of the left small saphenous vein was planned in a week 12/06/2016 -- he had his small saphenous vein venous ablation a week before and the duplex showed closure of his small saphenous vein to within half centimeter office saphenous popliteal junction with no DVT and he was pleased with the results. He recommended continue with elevation and compression and to see them back on an as-needed basis 12/19/16; the patient's wound is actually closed. Sometime over the weekend he developed a small abrasion on the lower calf just above the original wound on the left medial malleolus although this is closed as well Readmission: 01/18/18 on evaluation today patient presents after having last been seen in our clinic July 2018. Subsequently he is a reoccurrence of the ulcers on the left ankle both medial and now  a new area lateral that had been present back room for about a month he tells me. He states that this has done very well for about a year he really does not know of any injury or anything that happened that would have caused this reopening at this point. He has continued to wear compression stockings which is appropriate. He does not have lymphedema pumps. He has continued to also keep the area clean and dry as best he could. With that being said now that is been  reopened is been harder for him to take care of this as far as compression stockings are concerned keeping them clean along with continuing to manage his fluid and swelling. His ABI appears to be great on the left registering at 1.14 today. He has previously undergone a venous ablation. Other than compression/lymphedema pumps he's really done everything that he can to help manage and prevent these ulcers from forming. 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 [name] Fernandez, [name] Fernandez ([id number]) [id number] Page 7 of 10 01/30/18 on evaluation today patient appears to actually be doing very well in regard to his medial ankle ulcer. It's the lateral ulcer that actually is not doing quite as well today. Fortunately he did have some alginate left ovary start using this on the wounds and the medial ankle actually seems to be doing much better. The lateral ankle does actually need something to con a help with slough and buildup. The collagen really did not seem to do much for him in that regard. 02/13/18 on evaluation today patient appears to be doing about the same in regard to his ulcers. Unfortunately I do not feel like were making good progress at this time. When I have debrided the wound the slough seems to come back fairly quickly in the lateral ankle ulcer. There does not appear to be any evidence of infection at this time. No fevers, chills, nausea, or vomiting noted at this time. 02/27/18 on evaluation today patient actually appears to be showing some signs of improvement in regard to both wound areas. I'm insurance on the medial aspect of his left lower extremity at the ankle whether or not the alginate may be sticking and causing some tearing off of new skin attempting to grow. With that being said the patient states it does not seem to be doing such just pulls up some of the dried dead skin around. Nonetheless this is something I wanted to watch out for going forward and actually I  recommended that he take the dressing off in the shower when you could get it completely wet before removal to see how this does. Subsequently in regard to the lateral ankle ulcer he still has slough noted at this point although I do believe that he is tolerating the Medihoney much better I wish you could have gotten the Santyl but unfortunately the Santyl was too expensive gonna cost him $250. 03/06/18 on evaluation today patient appears to be doing about the same in regard to both ulcers of his left medial and lateral malleolus sites. He's been tolerating the dressing changes without complication. Unfortunately things do not seem to be improving in regard to either side at this time. Overall I think we may need to switch things up a little bit as far as the way we are performing the dressing changes currently. 03/27/18 on evaluation today patient's wound bed actually appears to be doing much better at both locations in regard to his ankle ulcers. He has been tolerating the  dressing changes at this time without complication. Overall I'm very pleased with how things appear. The patient likewise states that he's much better in regard to discomfort at this time. 04/10/18 on evaluation today patient actually appears to be doing well in regard to his left lateral ankle ulcer. He has been tolerating the dressing changes without complication. Fortunately there does not appear to be any evidence of infection. Overall I do feel like he is tolerating the Medihoney without complication. 04/24/18 upon evaluation today patient's wound actually appears to be healed on the lateral aspect of his ankle the medial aspect still continues to remain close without any evidence of complication at this time. Overall I have been extremely pleased with how things have progressed up to this point. The patient likewise is very happy he's not having any significant pain this time. He does wears compression stocking on a regular  basis. Readmission: 10-19-2021 upon evaluation today patient presents for evaluation here in the clinic concerning issues with a left lateral ulcer as well as a right lateral ulcer both along the aspect of his foot getting close to the ankle. This left area is the same place that I treated back in 2024. 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 This appears to be very inflamed and is also very painful. In the past he has been completely unable to tolerate the use of any compression wraps unfortunately. There does not appear to be any evidence of active infection locally nor systemically at this time which is good news. I do believe however we need to try to see what we can do to calm down the inflammation I think triamcinolone topically as well as oral prednisone would likely be indicated in this case. Patient's medical history really has not changed significantly since I last saw him in actually 2019 though I think is stated 2020 above. 10-26-21 upon evaluation today patient appears to still be having a lot of irritation and inflammation in regard to the ankles laterally. The left is greater than the right. With that being said I do believe that he would benefit from a biopsy to confirm whether or not this may indeed be vasculitis. It actually seems like that physically and on examination but again I want to be sure that we are on the right track as far as treatment is concerned. The good news is he did not have a DVT I called him about that on Friday this was all in regard to the right leg. Overall I am very pleased in that regard. 11-02-2021 upon evaluation today patient appears to be doing poorly still in regard to his left lower extremity. Apparently he had a fever on Sunday which was around 101 and he was severely sick feeling and disoriented according to his wife. She came with him today because she did not think that he would actually tell me what was  going on. With that being said unfortunately he did not go to the hospital he actually has been doing a little better over the past few days he been taking amoxicillin he has not taken any other medications orally at this point that are different. He has not been on any antibiotics either more recently other than the amoxicillin. Nonetheless he has not had a repeat in the past 3 days of that issue. 11-09-2021 upon evaluation today patient presents after having had quite an ordeal over the past several days. Subsequently yesterday I  got a call from [place] here in [place] concerning the patient and the fact that he was having bleeding that been going on for the past 24 hours and was not stopping. Subsequently my advice was to have the patient go to the ER for further evaluation and treatment. It seems to me that he had a regular way part of the wound down to the point that a blood vessel and open. Apparently he was filling up trash bags wrapped around his foot with blood. In fact his lab review which I did do him as well today showed that he went from a white hemoglobin on 09-14-2021 of 13.6 down to a hemoglobin of 11.2 on 11-08-2021 this was yesterday. Subsequently this is due to the amount of blood loss that he has had acutely. Again I do not believe he is at transfusion stage but at the same time he is extremely weak and states that a couple times when he is going to stand up he is actually fallen back into his chair. Nonetheless I do believe that he probably needs to supplement with iron and I did recommend that there are some over-the-counter products he could get in this regard. Subsequently also suggested that the patient needs to be drinking plenty of water he apparently drinks Coke and he drinks coffee but that is about it. Subsequently following this conversation his wife was present during this time as well and I do think that he is going to try to drink more I think it is of utmost importance.  With that being said I did review his PCR culture as well it was positive for 2 organisms. This was Pseudomonas and Enterobacter. Both were showing up as being very prevalent in the PCR culture and subsequently Cipro will take care of the situation which is what I am recommending at this point based on what we see. This will take the place of the Bactrim and can have him discontinue the Bactrim the only thing is he is getting need to stop taking the hydroxyzine if he has been taking it that is the Vistaril and he tells me that he only takes this when he itches he has not been taking it recently. 6/7; patient arrives in clinic today with the wound on the right foot actually looking some better the area on the left lateral looking about the same. Problematically, he is having edema fluid leakage on the medial part of the left foot and ankle causing skin breakdown but no open wound. He has been very resistant to the notion of compression wraps. He has been using Santyl on both wound areas at home changing the dressing himself Partway through our visit today it became clear he had had a fall on Sunday 3 days ago. Since then he has had a lot of problems with his right shoulder 11-23-2021 upon evaluation today patient appears to be doing well with regard to his wound. Has been tolerating dressing changes without complication. Fortunately there does not appear to be any signs of infection he was wrapped last week when Dr. Dellia Nims saw him on the left. He was having some breakdown medially which they were concerned about. He did keep the wrap on until Monday but states it got extremely wet. For that reason I am thinking of doing a nurse visit on Friday so that we can get some of the swelling down right now by Friday we should have a lot of that out that we can switch out and  put on a fresh dressing to get him through till next Wednesday. He is in agreement with giving that a try. 11-30-2021 upon evaluation today  patient actually appears to be doing better with regard to the overall appearance of his wound. Fortunately there does not appear to be any signs of active infection locally or systemically which is great news. With that being said he has been tolerating the dressing changes without complication other than the fact that from Wednesday to Friday he did well with the compression wrap from Friday till yesterday he tells me the drainage was so bad and the smell was so bad he had to take this off. It does appear that he is very macerated around the lower portion down around the heel and I do think that this is an issue. Unfortunately we cannot get home health to help with this currently. 12-07-2021 upon evaluation today patient appears to be doing well currently in regard to his wound. He is actually showing signs of excellent improvement which is great news. I am very pleased with where things stand. I do think that the progress is being made. With that being said the patient is tolerating the Santyl quite well and I think is doing an awesome job for him. 12-14-2021 upon evaluation today patient appears to be doing well with regard to his wounds he is making progress is still draining a lot however which I completely understand. With that being said fortunately there does not appear to be any signs of active infection locally or systemically at this time. [name] Fernandez, [name] Fernandez ([id number]) [id number] Page 8 of 10 12-21-2021 upon evaluation today patient appears to be doing well currently in regard to his wound. He is actually showing signs of significant improvement this is slow but nonetheless week by week we are seeing this improve on the lateral aspect of his foot. The measurements are little bit better but more importantly the wound surface is actually a whole lot cleaner. The patient is pleased to hear this. With that being said he tells me that it is really hard for him to see  exactly what all is going on. Nonetheless I am very pleased with all the new red tissue that were seeing granulating and hopefully we get this clean shortly and we can even switch to a different dressing to try to get it to close. 12-28-2021 upon evaluation today patient appears to be doing well currently in regard to his wound is actually showing signs of improvement we are going to perform some debridement today to clearway some of the necrotic debris. Overall I think that we are at the point where we do not have to use Santyl over the home wound I think using it just on the upper portion may be appropriate if we can get this completely cleaned away. 7/6; substantial wound on the left lateral foot. Using Santyl and Hydrofera Blue there is been good improvement since the last time I saw this. The patient does not tolerate compression wraps therefore he is using Ace wraps. Fortunately he appears to be doing this fairly well 01-11-22 upon evaluation today patient appears to be doing better in regard to his wound in general though he is having some issues here with blue-green drainage. I am going to go ahead and see about getting him sent in a prescription for gentamicin cream. 01-18-2022 upon evaluation today patient actually appears to be doing quite well in regards to his wound there green drainage we will previously  done with has improved. Fortunately I do not see any evidence of active infection locally or systemically which is great news. No fever chills noted 02-01-2022 upon evaluation today patient appears to be doing well with regard to his wound. Has been tolerating the dressing changes without complication. Fortunately I see no signs of active infection locally or systemically at this time which is great news. 02-15-2022 upon evaluation today patient appears to be doing excellent in regard to his wound he is actually showing signs of good improvement. This wound is looking much better and in fact  were getting close to not even needing the Santyl any longer. Overall though I think is still good to be helpful with the more proximal portion of the wound. 03-01-2022 upon evaluation today patient appears to be doing well with regard to his ulcer although there is some irritation in the skin around. I do believe this is in a large part due to drainage and I discussed that with him today. I do think that we need to try to see what we can do to try to prevent this from being an ongoing issue going forward. 03-15-2022 upon evaluation today patient unfortunately is having increased discomfort and pain. It seems like this is likely due to infection based on what I am seeing the wound is also measuring a little bit larger than what it has been in the past. Unfortunately I am seeing some signs of the patient likely has been having increased drainage she also has the [facility] ready not the ready transfer which also does not allow anything to drain completely through to an outside dressing I think this can be a little bit of an issue as well. 03-22-2022 upon evaluation today patient's foot actually seems to be doing significantly better which is great news. Fortunately I do not see any signs of active infection locally or systemically which is great news and overall I am extremely pleased with where things stand currently. 04-05-2022 upon evaluation today patient appears to be doing well currently in regard to his wound. He is actually been making good progress here and in fact in the 2 weeks since I last saw him he has significant improvement which is great news. Fortunately I do not see any evidence of infection locally or systemically which is great news. No fevers, chills, nausea, vomiting, or diarrhea. 05-03-2022 upon evaluation today patient appears to be doing better in regard to his wound compared to 2 weeks ago he showed me pictures of what it looked like at that time it appears that he was  likely infected. He started to take some of the leftover antibiotics that he had from previous with the Levaquin and has had about 7 to 9 days worth he states that he ran out of this today. Nonetheless he tells me it is much better and is not hurting nearly as bad and overall is doing significantly improved compared to what he was dealing with previous. Overall I am extremely pleased with that aspect of things. Nonetheless I am sorry to hear this was giving him such trouble. 05-17-2022 upon evaluation today patient appears to be doing well currently in regard to his wound in the distal portion of the wound unfortunately the proximal portion actually is irritating and causing much more discomfort at this point. Fortunately I do not see any signs of infection which is great news. No fevers, chills, nausea, vomiting, or diarrhea. Objective Constitutional Well-nourished and well-hydrated in no acute distress. Vitals  Time Taken: 10:21 AM, Height: 66 in, Weight: 184 lbs, BMI: 29.7, Pulse: 63 bpm, Respiratory Rate: 18 breaths/min, Blood Pressure: 121/69 mmHg. Respiratory normal breathing without difficulty. Psychiatric this patient is able to make decisions and demonstrates good insight into disease process. Alert and Oriented x 3. pleasant and cooperative. General Notes: Upon inspection patient's wound bed actually showed signs again of doing fairly well were not doing any debridements at this point he is utilizing the Santyl just over the areas in question whether some necrotic tissue but the most part we will get a switch over to just using the triamcinolone in general to try to help out with the irritation and inflammation. Hopefully this is going to calm down his pain to a degree as well. Integumentary (Hair, Skin) Wound #6 status is Open. Original cause of wound was Gradually Appeared. The date acquired was: 07/25/2021. The wound has been in treatment 30 weeks. The wound is located on the  Left,Lateral Foot. The wound measures 6cm length x 7.2cm width x 0.2cm depth; 33.929cm^2 area and 6.786cm^3 volume. There is Fat Layer (Subcutaneous Tissue) exposed. There is no tunneling or undermining noted. There is a large amount of serosanguineous drainage noted. The wound margin is distinct with the outline attached to the wound base. There is large (67-100%) red, pink, hyper - granulation within the wound bed. There is a small (1-33%) amount of necrotic tissue within the wound bed including Adherent Slough. The periwound skin appearance had no abnormalities noted for texture. The periwound skin appearance had no abnormalities noted for moisture. The periwound skin appearance did not exhibit: Atrophie Blanche, Cyanosis, Ecchymosis, Hemosiderin Staining, Mottled, Pallor, Rubor, Erythema. Periwound temperature was noted as No Abnormality. Wound #6 status is Open. Original cause of wound was Gradually Appeared. The date acquired was: 07/25/2021. The wound has been in treatment 30 weeks. The wound is located on the Left,Lateral Foot. The wound measures 6cm length x 7.2cm width x 0.2cm depth; 33.929cm^2 area and 6.786cm^3 volume. There [name] Fernandez, [name] Fernandez ([id number]) [id number] Page 9 of 10 is Fat Layer (Subcutaneous Tissue) exposed. There is no tunneling or undermining noted. There is a large amount of serosanguineous drainage noted. The wound margin is distinct with the outline attached to the wound base. There is large (67-100%) red, pink, hyper - granulation within the wound bed. There is a small (1-33%) amount of necrotic tissue within the wound bed including Adherent Slough. The periwound skin appearance had no abnormalities noted for texture. The periwound skin appearance exhibited: Maceration. The periwound skin appearance did not exhibit: Dry/Scaly, Atrophie Blanche, Cyanosis, Ecchymosis, Hemosiderin Staining, Mottled, Pallor, Rubor, Erythema. Periwound temperature was  noted as No Abnormality. 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 - with Jeri Cos, PA Wednesday Other: - Continue taking Levaquin Prism:Supplies Anesthetic: (In clinic) Topical Lidocaine 5% applied to wound bed Cellular or Tissue Based Products: Cellular or Tissue Based Product Type: - Will run IVR for Apligraf= $295 [employer]Pay, $3400 out of pocket with $1440 met. Bathing/ Shower/ Hygiene: May shower and wash wound with soap and water. - wash with antibacterial soap when changing dressing Edema Control - Lymphedema / SCD /  Other: Elevate legs to the level of the heart or above for 30 minutes daily and/or when sitting, a frequency of: - throughout the day Patient to wear own compression stockings every day. - Use stocking daily to right leg Moisturize legs daily. - Eucerin (in jar) Additional Orders / Instructions: Follow Nutritious Diet WOUND #6: - Foot Wound Laterality: Left, Lateral Cleanser: Soap and Water Every Other Day/30 Days Discharge Instructions: May shower and wash wound with dial antibacterial soap and water prior to dressing change. Peri-Wound Care: Triamcinolone 15 (g) Every Other Day/30 Days Discharge Instructions: Use triamcinolone 15 (g) around the wound and on top of wound bed Peri-Wound Care: Zinc Oxide Ointment 30g tube Every Other Day/30 Days Discharge Instructions: Apply Zinc Oxide to periwound with each dressing change Prim Dressing: Santyl Ointment Every Other Day/30 Days ary Discharge Instructions: apply nickel thick amount just to  sloughy/yellow areas Prim Dressing: Hydrofera Blue Ready Foam, 4x5 in (DME) (Dispense As Written) Every Other Day/30 Days ary Discharge Instructions: Apply to wound bed as instructed Secondary Dressing: ABD Pad, 8x10 Every Other Day/30 Days Discharge Instructions: Apply over primary dressing as directed. Secured With: Coban Self-Adherent Wrap 4x5 (in/yd) Every Other Day/30 Days Discharge Instructions: Secure with Coban as directed. Secured With: [employer], 4.5x3.1 (in/yd) (Generic) Every Other Day/30 Days Discharge Instructions: Secure with Kerlix as directed. Secured With: 46M Medipore Public affairs consultant Surgical T 2x10 (in/yd) (Generic) Every Other Day/30 Days ape Discharge Instructions: Secure with tape as directed. Com pression Wrap: ACE Wrap Every Other Day/30 Days Discharge Instructions: For Compression 1. I am going to recommend currently that we have the patient continue to monitor for any signs of infection. Obviously if anything changes he has the Levaquin on hand and he knows what to look out for. 2. I am also can recommend the patient should continue to monitor for any evidence of worsening in general. Obviously if anything changes he knows and let me know. 3. I am also can recommend that the patient should after applying the triamcinolone cream go ahead and then apply the [facility] then the ABD pad and roll gauze to secure in place. We will see patient back for reevaluation in 1 week here in the clinic. If anything worsens or changes patient will contact our office for additional recommendations. Electronic Signature(s) Signed: 05/17/2022 11:12:32 AM By: [name] PA-C Entered By: [name] on 05/17/2022 11:12:32 [name] ([id number]) [id number] Page 10 of 10 -------------------------------------------------------------------------------- SuperBill Details Patient Name: Date of Service: [name] Fernandez, [name] Fernandez [place] LD C.  05/17/2022 Medical Record Number: [id number] Patient Account Number: [id number] Date of Birth/Sex: Treating RN: 03-Jul-1940 (81 y.o. 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 L97.522 Non-pressure chronic ulcer of other part of left foot with fat layer exposed L97.512 Non-pressure chronic ulcer of other part of right foot with fat layer exposed E11.622 Type 2 diabetes mellitus with other skin ulcer Facility Procedures : CPT4 Code: 81856314 Description: 99213 - WOUND CARE VISIT-LEV 3 EST PT Modifier: Quantity: 1 Physician Procedures : CPT4 Code Description Modifier [id number] - WC PHYS LEVEL 4 - EST PT ICD-10 Diagnosis Description I87.333 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 extremity I89.0 Lymphedema, not  elsewhere classified  L95.8 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

## 2022-05-18 DIAGNOSIS — I87333 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 extremity: Secondary | ICD-10-CM | POA: Diagnosis not present

## 2022-05-18 DIAGNOSIS — L97522 Non-pressure chronic ulcer of other part of left foot with fat layer exposed: Secondary | ICD-10-CM | POA: Diagnosis not present

## 2022-05-18 DIAGNOSIS — I890 Lymphedema, not elsewhere classified: Secondary | ICD-10-CM | POA: Diagnosis not present

## 2022-05-18 DIAGNOSIS — L97512 Non-pressure chronic ulcer of other part of right foot with fat layer exposed: Secondary | ICD-10-CM | POA: Diagnosis not present

## 2022-05-18 DIAGNOSIS — E11622 Type 2 diabetes mellitus with other skin ulcer: Secondary | ICD-10-CM | POA: Diagnosis not present

## 2022-05-30 ENCOUNTER — Other Ambulatory Visit: Payer: Self-pay | Admitting: Internal Medicine

## 2022-05-31 ENCOUNTER — Encounter (HOSPITAL_BASED_OUTPATIENT_CLINIC_OR_DEPARTMENT_OTHER): Payer: Medicare HMO | Admitting: Physician Assistant

## 2022-06-09 ENCOUNTER — Telehealth: Payer: Self-pay | Admitting: Internal Medicine

## 2022-06-09 NOTE — Progress Notes (Signed)
  Chronic Care Management   Note  06/09/2022 Name: DAVARI LOPES MRN: [id number] DOB: 02/09/1941  HYMIE GORR is a 81 y.o. year old male who is a primary care patient of Unk Pinto, MD. I reached out to [name] by phone today in response to a referral sent by Mr. Curly Mackowski Villalpando's PCP, Unk Pinto, MD.   Mr. Axelson was given information about Chronic Care Management services today including:  CCM service includes personalized support from designated clinical staff supervised by his physician, including individualized plan of care and coordination with other care providers 24/7 contact phone numbers for assistance for urgent and routine care needs. Service will only be billed when office clinical staff spend 20 minutes or more in a month to coordinate care. Only one practitioner may furnish and bill the service in a calendar month. The patient may stop CCM services at any time (effective at the end of the month) by phone call to the office staff.   Patient did not agree to enrollment in care management services and does not wish to consider at this time.  Follow up plan: [place]

## 2022-06-14 ENCOUNTER — Encounter (HOSPITAL_BASED_OUTPATIENT_CLINIC_OR_DEPARTMENT_OTHER): Payer: Medicare HMO | Attending: Physician Assistant | Admitting: Physician Assistant

## 2022-06-14 ENCOUNTER — Inpatient Hospital Stay (HOSPITAL_COMMUNITY)
Admission: EM | Admit: 2022-06-14 | Discharge: 2022-06-15 | DRG: 593 | Discharge status: Against Medical Advice | Payer: Medicare HMO | Source: Ambulatory Visit | Attending: Internal Medicine | Admitting: Internal Medicine

## 2022-06-14 ENCOUNTER — Other Ambulatory Visit: Payer: Self-pay

## 2022-06-14 ENCOUNTER — Encounter (HOSPITAL_COMMUNITY): Payer: Self-pay | Admitting: Internal Medicine

## 2022-06-14 ENCOUNTER — Emergency Department (HOSPITAL_COMMUNITY): Payer: Medicare HMO

## 2022-06-14 DIAGNOSIS — Z800 Family history of malignant neoplasm of digestive organs: Secondary | ICD-10-CM

## 2022-06-14 DIAGNOSIS — I87333 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 extremity: Secondary | ICD-10-CM | POA: Insufficient documentation

## 2022-06-14 DIAGNOSIS — I129 Hypertensive chronic kidney disease with stage 1 through stage 4 chronic kidney disease, or unspecified chronic kidney disease: Secondary | ICD-10-CM | POA: Diagnosis not present

## 2022-06-14 DIAGNOSIS — L97522 Non-pressure chronic ulcer of other part of left foot with fat layer exposed: Secondary | ICD-10-CM | POA: Insufficient documentation

## 2022-06-14 DIAGNOSIS — L958 Other vasculitis limited to the skin: Secondary | ICD-10-CM | POA: Diagnosis not present

## 2022-06-14 DIAGNOSIS — Z87891 Personal history of nicotine dependence: Secondary | ICD-10-CM | POA: Diagnosis not present

## 2022-06-14 DIAGNOSIS — K900 Celiac disease: Secondary | ICD-10-CM | POA: Diagnosis present

## 2022-06-14 DIAGNOSIS — Z96651 Presence of right artificial knee joint: Secondary | ICD-10-CM | POA: Diagnosis present

## 2022-06-14 DIAGNOSIS — N189 Chronic kidney disease, unspecified: Secondary | ICD-10-CM | POA: Diagnosis not present

## 2022-06-14 DIAGNOSIS — E11622 Type 2 diabetes mellitus with other skin ulcer: Secondary | ICD-10-CM | POA: Diagnosis not present

## 2022-06-14 DIAGNOSIS — K219 Gastro-esophageal reflux disease without esophagitis: Secondary | ICD-10-CM | POA: Diagnosis present

## 2022-06-14 DIAGNOSIS — E538 Deficiency of other specified B group vitamins: Secondary | ICD-10-CM | POA: Diagnosis present

## 2022-06-14 DIAGNOSIS — E785 Hyperlipidemia, unspecified: Secondary | ICD-10-CM | POA: Diagnosis present

## 2022-06-14 DIAGNOSIS — E559 Vitamin D deficiency, unspecified: Secondary | ICD-10-CM | POA: Diagnosis present

## 2022-06-14 DIAGNOSIS — M659 Synovitis and tenosynovitis, unspecified: Secondary | ICD-10-CM | POA: Diagnosis not present

## 2022-06-14 DIAGNOSIS — I890 Lymphedema, not elsewhere classified: Secondary | ICD-10-CM | POA: Insufficient documentation

## 2022-06-14 DIAGNOSIS — L97322 Non-pressure chronic ulcer of left ankle with fat layer exposed: Principal | ICD-10-CM | POA: Diagnosis present

## 2022-06-14 DIAGNOSIS — L97323 Non-pressure chronic ulcer of left ankle with necrosis of muscle: Secondary | ICD-10-CM

## 2022-06-14 DIAGNOSIS — M65872 Other synovitis and tenosynovitis, left ankle and foot: Secondary | ICD-10-CM | POA: Diagnosis not present

## 2022-06-14 DIAGNOSIS — I872 Venous insufficiency (chronic) (peripheral): Secondary | ICD-10-CM | POA: Diagnosis present

## 2022-06-14 DIAGNOSIS — E039 Hypothyroidism, unspecified: Secondary | ICD-10-CM | POA: Diagnosis present

## 2022-06-14 DIAGNOSIS — R7303 Prediabetes: Secondary | ICD-10-CM | POA: Diagnosis present

## 2022-06-14 DIAGNOSIS — L97922 Non-pressure chronic ulcer of unspecified part of left lower leg with fat layer exposed: Secondary | ICD-10-CM

## 2022-06-14 DIAGNOSIS — L97512 Non-pressure chronic ulcer of other part of right foot with fat layer exposed: Secondary | ICD-10-CM | POA: Insufficient documentation

## 2022-06-14 DIAGNOSIS — T148XXA Other injury of unspecified body region, initial encounter: Secondary | ICD-10-CM | POA: Diagnosis not present

## 2022-06-14 DIAGNOSIS — S91002A Unspecified open wound, left ankle, initial encounter: Secondary | ICD-10-CM | POA: Diagnosis not present

## 2022-06-14 DIAGNOSIS — L089 Local infection of the skin and subcutaneous tissue, unspecified: Secondary | ICD-10-CM | POA: Diagnosis not present

## 2022-06-14 DIAGNOSIS — M7732 Calcaneal spur, left foot: Secondary | ICD-10-CM | POA: Diagnosis not present

## 2022-06-14 DIAGNOSIS — D649 Anemia, unspecified: Secondary | ICD-10-CM | POA: Diagnosis not present

## 2022-06-14 DIAGNOSIS — L03116 Cellulitis of left lower limb: Secondary | ICD-10-CM | POA: Diagnosis not present

## 2022-06-14 DIAGNOSIS — A498 Other bacterial infections of unspecified site: Secondary | ICD-10-CM | POA: Diagnosis not present

## 2022-06-14 DIAGNOSIS — M8618 Other acute osteomyelitis, other site: Secondary | ICD-10-CM | POA: Diagnosis not present

## 2022-06-14 DIAGNOSIS — L97329 Non-pressure chronic ulcer of left ankle with unspecified severity: Secondary | ICD-10-CM | POA: Diagnosis present

## 2022-06-14 DIAGNOSIS — I10 Essential (primary) hypertension: Secondary | ICD-10-CM | POA: Diagnosis present

## 2022-06-14 DIAGNOSIS — L97929 Non-pressure chronic ulcer of unspecified part of left lower leg with unspecified severity: Secondary | ICD-10-CM | POA: Diagnosis not present

## 2022-06-14 LAB — COMPREHENSIVE METABOLIC PANEL
ALT: 20 U/L (ref 0–44)
AST: 24 U/L (ref 15–41)
Albumin: 3.8 g/dL (ref 3.5–5.0)
Alkaline Phosphatase: 76 U/L (ref 38–126)
Anion gap: 7 (ref 5–15)
BUN: 19 mg/dL (ref 8–23)
CO2: 28 mmol/L (ref 22–32)
Calcium: 9.1 mg/dL (ref 8.9–10.3)
Chloride: 104 mmol/L (ref 98–111)
Creatinine, Ser: 0.86 mg/dL (ref 0.61–1.24)
GFR, Estimated: >60 mL/min (ref >60)
Glucose, Bld: 104 mg/dL — ABNORMAL HIGH (ref 70–99)
Potassium: 4 mmol/L (ref 3.5–5.1)
Sodium: 139 mmol/L (ref 135–145)
Total Bilirubin: 0.7 mg/dL (ref 0.3–1.2)
Total Protein: 7.8 g/dL (ref 6.5–8.1)

## 2022-06-14 LAB — CBC WITH DIFFERENTIAL/PLATELET
Abs Immature Granulocytes: 0.03 10*3/uL (ref 0.00–0.07)
Basophils Absolute: 0.1 10*3/uL (ref 0.0–0.1)
Basophils Relative: 1 %
Eosinophils Absolute: 0.2 10*3/uL (ref 0.0–0.5)
Eosinophils Relative: 3 %
HCT: 39.8 % (ref 39.0–52.0)
Hemoglobin: 13.3 g/dL (ref 13.0–17.0)
Immature Granulocytes: 0 %
Lymphocytes Relative: 20 %
Lymphs Abs: 1.8 10*3/uL (ref 0.7–4.0)
MCH: 31.7 pg (ref 26.0–34.0)
MCHC: 33.4 g/dL (ref 30.0–36.0)
MCV: 94.8 fL (ref 80.0–100.0)
Monocytes Absolute: 0.6 10*3/uL (ref 0.1–1.0)
Monocytes Relative: 7 %
Neutro Abs: 6.6 10*3/uL (ref 1.7–7.7)
Neutrophils Relative %: 69 %
Platelets: 459 10*3/uL — ABNORMAL HIGH (ref 150–400)
RBC: 4.2 MIL/uL — ABNORMAL LOW (ref 4.22–5.81)
RDW: 15.6 % — ABNORMAL HIGH (ref 11.5–15.5)
WBC: 9.4 10*3/uL (ref 4.0–10.5)
nRBC: 0 % (ref 0.0–0.2)

## 2022-06-14 LAB — MRSA NEXT GEN BY PCR, NASAL: MRSA by PCR Next Gen: NOT DETECTED

## 2022-06-14 LAB — LACTIC ACID, PLASMA: Lactic Acid, Venous: 1.1 mmol/L (ref 0.5–1.9)

## 2022-06-14 LAB — SEDIMENTATION RATE: Sed Rate: 40 mm/hr — ABNORMAL HIGH (ref 0–16)

## 2022-06-14 MED ORDER — SENNA 8.6 MG PO TABS: 1.0000 | ORAL_TABLET | Freq: Every day | ORAL | Status: DC

## 2022-06-14 MED ORDER — METRONIDAZOLE 500 MG PO TABS: 500.0000 mg | ORAL_TABLET | Freq: Two times a day (BID) | ORAL | Status: DC

## 2022-06-14 MED ORDER — ONDANSETRON HCL 4 MG PO TABS: 4.0000 mg | ORAL_TABLET | Freq: Four times a day (QID) | ORAL | Status: DC | PRN

## 2022-06-14 MED ORDER — GABAPENTIN 300 MG PO CAPS: 300.0000 mg | ORAL_CAPSULE | Freq: Every day | ORAL | Status: DC

## 2022-06-14 MED ORDER — ONDANSETRON HCL 4 MG/2ML IJ SOLN: 4.0000 mg | Freq: Four times a day (QID) | INTRAMUSCULAR | Status: DC | PRN

## 2022-06-14 MED ORDER — PIPERACILLIN-TAZOBACTAM 3.375 G IVPB
3.3750 g | Freq: Three times a day (TID) | INTRAVENOUS | Status: DC
  Administered 2022-06-14: 3.375 g via INTRAVENOUS
  Filled 2022-06-14: qty 50

## 2022-06-14 MED ORDER — GABAPENTIN 300 MG PO CAPS: 600.0000 mg | ORAL_CAPSULE | Freq: Two times a day (BID) | ORAL | Status: DC | PRN

## 2022-06-14 MED ORDER — TORSEMIDE 20 MG PO TABS: 20.0000 mg | ORAL_TABLET | Freq: Every day | ORAL | Status: DC

## 2022-06-14 MED ORDER — SODIUM CHLORIDE 0.9 % IV SOLN: 2.0000 g | Freq: Once | INTRAVENOUS | Status: DC

## 2022-06-14 MED ORDER — ACETAMINOPHEN 325 MG PO TABS: 650.0000 mg | ORAL_TABLET | Freq: Four times a day (QID) | ORAL | Status: DC | PRN

## 2022-06-14 MED ORDER — GABAPENTIN 600 MG PO TABS
300.0000 mg | ORAL_TABLET | Freq: Every day | ORAL | Status: DC
  Filled 2022-06-14: qty 0.5

## 2022-06-14 MED ORDER — PANTOPRAZOLE SODIUM 40 MG PO TBEC: 40.0000 mg | DELAYED_RELEASE_TABLET | Freq: Every day | ORAL | Status: DC

## 2022-06-14 MED ORDER — OMEPRAZOLE MAGNESIUM 20 MG PO TBEC: 20.0000 mg | DELAYED_RELEASE_TABLET | Freq: Every day | ORAL | Status: DC

## 2022-06-14 MED ORDER — ENOXAPARIN SODIUM 40 MG/0.4ML IJ SOSY: 40.0000 mg | PREFILLED_SYRINGE | INTRAMUSCULAR | Status: DC

## 2022-06-14 MED ORDER — HYDROXYZINE HCL 25 MG PO TABS: 25.0000 mg | ORAL_TABLET | Freq: Every day | ORAL | Status: DC

## 2022-06-14 MED ORDER — ACETAMINOPHEN 650 MG RE SUPP: 650.0000 mg | Freq: Four times a day (QID) | RECTAL | Status: DC | PRN

## 2022-06-14 NOTE — Progress Notes (Signed)
Pharmacy Antibiotic Note  [name] Fernandez is a 82 y.o. male admitted on 06/14/2022 with diabetic foot wound.  Pharmacy has been consulted for Zosyn dosing.  Plan: Zosyn 3.375gm IV q8h (4hr extended infusions) No dose adjustments anticipated.  Pharmacy will sign off and monitor peripherally via electronic surveillance software for any changes in renal function or micro data.   Height: 5\' 6"  (167.6 cm) Weight: 81.6 kg (180 lb) IBW/kg (Calculated) : 63.8  Temp (24hrs), Avg:98 F (36.7 C), Min:97.8 F (36.6 C), Max:98.2 F (36.8 C)  Recent Labs  Lab 06/14/22 1315  WBC 9.4  CREATININE 0.86  LATICACIDVEN 1.1    Estimated Creatinine Clearance: 67.6 mL/min (by C-G formula based on SCr of 0.86 mg/dL).    Allergies  Allergen Reactions   Feraheme [Ferumoxytol] Other (See Comments)    Swelling of lips and tongue, could not talk 30 minutes after the infusion.   Naprosyn [Naproxen] Swelling    Unsure if naprosyn caused this reaction   Latex    Levothyroxine     Lip swelling   Doxycycline Nausea Only    Antimicrobials this admission: 1/3 Zosyn >>  Dose adjustments this admission:  Microbiology results: 10/4 L foot Wound: Pseudomonas, Enterococcus  Thank you for allowing pharmacy to be a part of this patient's care.  Peggyann Juba, PharmD, BCPS Pharmacy: [phone] 06/14/2022 8:03 PM

## 2022-06-14 NOTE — ED Provider Notes (Signed)
[facility] DEPT Provider Note   CSN: [id number] Arrival date & time: 06/14/22  1012     History  Chief Complaint  Patient presents with   Foot Pain    [name] Fernandez is a 82 y.o. male.   Foot Pain     Patient has a history of reflux, celiac disease, hypertension, hyperlipidemia, chronic kidney disease, prediabetes and a persistent foot wound who presents to the ED for evaluation.  Patient states he has had a wound on his left foot around the ankle for several months.  He has been receiving treatment at the wound care center.  He has been on oral antibiotics.  Patient states 2 weeks ago the wound started appearing worse and turning black in color.  He went to the wound center today and was instructed to come to the ED for MRI and further evaluation.  Patient denies any fevers.  No increasing pain  Home Medications Prior to Admission medications   Medication Sig Start Date End Date Taking? Authorizing Provider  Ascorbic Acid (VITAMIN C) 1000 MG tablet Take 1,000 mg by mouth daily.    [provider]  Cholecalciferol (VITAMIN D PO) Take 5,000 Int'l Units by mouth daily. BID    [provider]  clotrimazole-betamethasone (LOTRISONE) cream APPLY TO AFFECTED AREA TWICE A DAY 10/24/21   Alycia Rossetti, NP  Ferrous Sulfate (IRON) 325 (65 Fe) MG TABS Take by mouth.    [provider]  gabapentin (NEURONTIN) 600 MG tablet Take 1/2 to 1 tablet 2 to 3 x /Daily as needed for Chronic Pain 10/24/21   Unk Pinto, MD  hydrOXYzine (ATARAX) 25 MG tablet Take  1 tablet  3 x /day  as needed  for Anxiety or Sleep 04/04/22   Unk Pinto, MD  levofloxacin (LEVAQUIN) 500 MG tablet Take 500 mg by mouth daily.    [provider]  omeprazole (PRILOSEC) 20 MG capsule Take 1 capsule (20 mg total) by mouth daily. 04/30/12   Pyrtle, Lajuan Lines, MD  OVER THE COUNTER MEDICATION Thyroid Complete    [provider]  Red Yeast Rice 600  MG CAPS PRN 02/26/21   Unk Pinto, MD  Sennosides (SENOKOT PO) Take by mouth as needed.    [provider]  torsemide (DEMADEX) 20 MG tablet Take  1 tablet  Daily for Fluid Retention / Leg Swelling 01/20/22   Unk Pinto, MD  triamcinolone cream (KENALOG) 0.5 % Apply 1 application topically 2 (two) times daily. 08/11/20   Unk Pinto, MD      Allergies    Feraheme [ferumoxytol], Naprosyn [naproxen], Latex, Levothyroxine, and Doxycycline    Review of Systems   Review of Systems  Physical Exam Updated Vital Signs BP 133/77   Pulse 64   Temp 97.8 F (36.6 C)   Resp 17   Ht 1.676 m (5\' 6" )   Wt 81.6 kg   SpO2 98%   BMI 29.05 kg/m  Physical Exam Vitals and nursing note reviewed.  Constitutional:      Appearance: He is well-developed. He is not diaphoretic.  HENT:     Head: Normocephalic and atraumatic.     Right Ear: External ear normal.     Left Ear: External ear normal.  Eyes:     General: No scleral icterus.       Right eye: No discharge.        Left eye: No discharge.     Conjunctiva/sclera: Conjunctivae normal.  Neck:  Trachea: No tracheal deviation.  Cardiovascular:     Rate and Rhythm: Normal rate and regular rhythm.  Pulmonary:     Effort: Pulmonary effort is normal. No respiratory distress.     Breath sounds: Normal breath sounds. No stridor. No wheezing or rales.  Abdominal:     General: Bowel sounds are normal. There is no distension.     Palpations: Abdomen is soft.     Tenderness: There is no abdominal tenderness. There is no guarding or rebound.  Musculoskeletal:        General: Tenderness present. No deformity.     Cervical back: Neck supple.     Comments: Tenderness palpation lateral ankle, lower extremity is still in a compression dressing, family showed pictures of his ankle that they took this morning before the wound was redressed.  He has a black eschar around the lateral aspect of his ankle that involves the entire lateral  aspect of his ankle extending down to his foot  Skin:    General: Skin is warm and dry.     Findings: No rash.  Neurological:     General: No focal deficit present.     Mental Status: He is alert.     Cranial Nerves: No cranial nerve deficit, dysarthria or facial asymmetry.     Sensory: No sensory deficit.     Motor: No abnormal muscle tone or seizure activity.     Coordination: Coordination normal.  Psychiatric:        Mood and Affect: Mood normal.     ED Results / Procedures / Treatments   Labs (all labs ordered are listed, but only abnormal results are displayed) Labs Reviewed  COMPREHENSIVE METABOLIC PANEL - Abnormal; Notable for the following components:      Result Value   Glucose, Bld 104 (*)    All other components within normal limits  CBC WITH DIFFERENTIAL/PLATELET - Abnormal; Notable for the following components:   RBC 4.20 (*)    RDW 15.6 (*)    Platelets 459 (*)    All other components within normal limits  MRSA NEXT GEN BY PCR, NASAL  AEROBIC/ANAEROBIC CULTURE W GRAM STAIN (SURGICAL/DEEP WOUND)  LACTIC ACID, PLASMA  SEDIMENTATION RATE  C-REACTIVE PROTEIN  PREALBUMIN    EKG None  Radiology MR ANKLE LEFT WO CONTRAST  Result Date: 06/14/2022 CLINICAL DATA:  Nonhealing wound along the lateral left ankle, discoloration of the skin. EXAM: MRI OF THE LEFT ANKLE WITHOUT CONTRAST TECHNIQUE: Multiplanar, multisequence MR imaging of the ankle was performed. No intravenous contrast was administered. COMPARISON:  Radiographs 01/14/2013 FINDINGS: TENDONS Peroneal: Common peroneus tendon sheath tenosynovitis. Posteromedial: Mild tibialis posterior tenosynovitis. Anterior: Unremarkable Achilles: Unremarkable Plantar Fascia: Mild thickening of the medial and lateral bands of the plantar fascia proximally. Plantar calcaneal spur noted. LIGAMENTS Lateral: Thin/attenuated ATFL probably from remote injury. The ATFL does not currently appear overtly discontinuous. No surrounding  synovitis. Medial: Unremarkable CARTILAGE Ankle Joint: Unremarkable Subtalar Joints/Sinus Tarsi: Unremarkable Bones: Subtle endosteal edema along the lateral malleolus underlying the region of cutaneous irregularity. Other: Cutaneous thinning and irregularity superficial to the lateral malleolus and tracking along the anterolateral ankle region compatible with ulceration. No drainable abscess observed. IMPRESSION: 1. Cutaneous thinning and irregularity superficial to the lateral malleolus and tracking along the anterolateral ankle region compatible with ulceration. No drainable abscess observed. 2. Subtle endosteal edema along the lateral malleolus underlying the region of cutaneous irregularity, probably reactive to local inflammation and not of a magnitude to be specific for osteomyelitis  at this time. 3. Common peroneus tendon sheath tenosynovitis. Mild tibialis posterior tenosynovitis. 4. Mild thickening of the medial and lateral bands of the plantar fascia proximally, with plantar calcaneal spur. 5. Thin/attenuated ATFL probably from remote injury. The ATFL does not currently appear overtly discontinuous. Electronically Signed   By: Van Clines M.D.   On: 06/14/2022 14:32    Procedures Procedures    Medications Ordered in ED Medications  piperacillin-tazobactam (ZOSYN) IVPB 3.375 g (has no administration in time range)  enoxaparin (LOVENOX) injection 40 mg (has no administration in time range)  acetaminophen (TYLENOL) tablet 650 mg (has no administration in time range)    Or  acetaminophen (TYLENOL) suppository 650 mg (has no administration in time range)  ondansetron (ZOFRAN) tablet 4 mg (has no administration in time range)    Or  ondansetron (ZOFRAN) injection 4 mg (has no administration in time range)    ED Course/ Medical Decision Making/ A&P Clinical Course as of 06/14/22 2052  Wed Jun 14, 2022  2016 CBC normal.  Metabolic panel normal.  Lactic acid level normal. [JK]  2016  MRI findings reviewed shows evidence of tendon involvement but no definite osteomyelitis or abscess at this time [JK]  2016 Case discussed with Dr. Alcario Drought regarding admission [JK]  2049 Case discussed with Dr. Marlou Sa.  He will consult on the patient.  Requests admission to [facility] [JK]    Clinical Course User Index [JK] Dorie Rank, MD                           Medical Decision Making Problems Addressed: Cellulitis of left lower extremity: chronic illness or injury with exacerbation, progression, or side effects of treatment Ulcer of left lower extremity with fat layer exposed [facility]): chronic illness or injury with exacerbation, progression, or side effects of treatment  Amount and/or Complexity of Data Reviewed Labs: ordered. Decision-making details documented in ED Course. Radiology: ordered.  Risk Prescription drug management. Decision regarding hospitalization.   Patient presented to the ED for evaluation of a worsening lower extremity wound.  Patient has been getting treatment at the wound care center.  He has been on oral antibiotics.  Patient has noted worsening discoloration of the wound.  He now has a black eschar.  Patient without signs of systemic infection at this time.  No fever.  Otherwise appears stable.  MRI however does show evidence of possible infection of the tendon.  No osteomyelitis at this time.  No abscess at this time.  With his worsening infection I have consulted with the medical service for admission and orthopedics        Final Clinical Impression(s) / ED Diagnoses Final diagnoses:  Cellulitis of left lower extremity  Ulcer of left lower extremity with fat layer exposed [facility])    Rx / [place] Orders ED Discharge Orders     None         Dorie Rank, MD 06/14/22 2052

## 2022-06-14 NOTE — ED Triage Notes (Signed)
Pt sent from wound center for MRI and [place] of left foot. Pt states hx ulcer to left ankle "started turning black 2 weeks ago"

## 2022-06-14 NOTE — H&P (Addendum)
History and Physical    Patient: [name] Fernandez BTD:[id number] DOB: [birth year] DOA: 06/14/2022 DOS: the patient was seen and examined on 06/14/2022 PCP: Unk Pinto, MD  Patient coming from: Home  Chief Complaint:  Chief Complaint  Patient presents with   Foot Pain   HPI: [name] Fernandez is a 82 y.o. male with medical history significant of HTN, hypothyroidism.  Pt with L ankle venous stasis ulcer history, s/p ablation of saphenous veins in 2018.  Pt with recurrent ankle ulcer for past several months.  Wound culture in Oct grew pseudomonas and enterococcus (both pan sensitive at that time).  Despite wound care and outpt abx ulcer has progressively worsened.  Sent in from wound care to ED today for admission.   Review of Systems: As mentioned in the history of present illness. All other systems reviewed and are negative. Past Medical History:  Diagnosis Date   Anemia    Arthritis    Celiac disease    diagnoses 06/24/12   Esophageal stricture    H/O hiatal hernia    Hiatal hernia    Hypertension    Hypothyroidism    Prediabetes    Shortness of breath    from oxycodone at times   Vitamin B12 deficiency    Vitamin D deficiency    Past Surgical History:  Procedure Laterality Date   BACK SURGERY     3 diff surgeries for vertebrea broken   EYE SURGERY     bilateral cataract surgery   HERNIA REPAIR  1950's   bilateral groin as child   HIATAL HERNIA REPAIR     JOINT REPLACEMENT  04/2010   right knee replacement   TONSILLECTOMY     as child   TOTAL KNEE REVISION  11/20/2011   Procedure: TOTAL KNEE REVISION;  Surgeon: Mauri Pole, MD;  Location: [facility];  Service: Orthopedics;  Laterality: Right;  Right Total Knee Revision   Social History:  reports that he quit smoking about 41 years ago. His smoking use included cigarettes. He has a 20.00 pack-year smoking history. He has been exposed to tobacco smoke. He has never used smokeless tobacco. He reports that he does not  drink alcohol and does not use drugs.  Allergies  Allergen Reactions   Feraheme [Ferumoxytol] Other (See Comments)    Swelling of lips and tongue, could not talk 30 minutes after the infusion.   Naprosyn [Naproxen] Swelling    Unsure if naprosyn caused this reaction   Latex    Levothyroxine     Lip swelling   Doxycycline Nausea Only    Family History  Problem Relation Age of Onset   Esophageal cancer Father     Prior to Admission medications   Medication Sig Start Date End Date Taking? Authorizing Provider  Ascorbic Acid (VITAMIN C) 1000 MG tablet Take 1,000 mg by mouth daily.    [provider]  Cholecalciferol (VITAMIN D PO) Take 5,000 Int'l Units by mouth daily. BID    [provider]  clotrimazole-betamethasone (LOTRISONE) cream APPLY TO AFFECTED AREA TWICE A DAY 10/24/21   Alycia Rossetti, NP  Ferrous Sulfate (IRON) 325 (65 Fe) MG TABS Take by mouth.    [provider]  gabapentin (NEURONTIN) 600 MG tablet Take 1/2 to 1 tablet 2 to 3 x /Daily as needed for Chronic Pain 10/24/21   Unk Pinto, MD  hydrOXYzine (ATARAX) 25 MG tablet Take  1 tablet  3 x /day  as needed  for Anxiety  or Sleep 04/04/22   Lucky Cowboy, MD  levofloxacin (LEVAQUIN) 500 MG tablet Take 500 mg by mouth daily.    [provider]  omeprazole (PRILOSEC) 20 MG capsule Take 1 capsule (20 mg total) by mouth daily. 04/30/12   Pyrtle, Carie Caddy, MD  OVER THE COUNTER MEDICATION Thyroid Complete    [provider]  Red Yeast Rice 600 MG CAPS PRN 02/26/21   Lucky Cowboy, MD  Sennosides (SENOKOT PO) Take by mouth as needed.    [provider]  torsemide (DEMADEX) 20 MG tablet Take  1 tablet  Daily for Fluid Retention / Leg Swelling 01/20/22   Lucky Cowboy, MD  triamcinolone cream (KENALOG) 0.5 % Apply 1 application topically 2 (two) times daily. 08/11/20   Lucky Cowboy, MD    Physical Exam: Vitals:   06/14/22 1147 06/14/22 1148 06/14/22 1526  BP:  (!) 153/84  133/77  Pulse: 61  64  Resp: 18  17  Temp: 98.2 F (36.8 C)  97.8 F (36.6 C)  TempSrc: Oral  Oral  SpO2: 97%  98%  Weight:  81.6 kg   Height:  5\' 6"  (1.676 m)    Constitutional: NAD, calm, comfortable Eyes: PERRL, lids and conjunctivae normal ENMT: Mucous membranes are moist. Posterior pharynx clear of any exudate or lesions.Normal dentition.  Neck: normal, supple, no masses, no thyromegaly Respiratory: clear to auscultation bilaterally, no wheezing, no crackles. Normal respiratory effort. No accessory muscle use.  Cardiovascular: Regular rate and rhythm, no murmurs / rubs / gallops. No extremity edema. 2+ pedal pulses. No carotid bruits.  Abdomen: no tenderness, no masses palpated. No hepatosplenomegaly. Bowel sounds positive.  Musculoskeletal: no clubbing / cyanosis. No joint deformity upper and lower extremities. Good ROM, no contractures. Normal muscle tone.  Skin: Wound under dressing, family has photo though:  Neurologic: CN 2-12 grossly intact. Sensation intact, DTR normal. Strength 5/5 in all 4.  Psychiatric: Normal judgment and insight. Alert and oriented x 3. Normal mood.   Data Reviewed:       Latest Ref Rng & Units 06/14/2022    1:15 PM 04/04/2022    2:50 PM 11/08/2021    6:20 PM  CBC  WBC 4.0 - 10.5 K/uL 9.4  9.2  8.1   Hemoglobin 13.0 - 17.0 g/dL 11/10/2021  76.5  46.5   Hematocrit 39.0 - 52.0 % 39.8  37.1  32.4   Platelets 150 - 400 K/uL 459  413  458       Latest Ref Rng & Units 06/14/2022    1:15 PM 04/04/2022    2:50 PM 09/14/2021    3:30 PM  CMP  Glucose 70 - 99 mg/dL 11/14/2021  465  93   BUN 8 - 23 mg/dL 19  16  12    Creatinine 0.61 - 1.24 mg/dL 681   2.75   Sodium 135 - 145 mmol/L 139  139  131   Potassium 3.5 - 5.1 mmol/L 4.0  4.8  5.5   Chloride 98 - 111 mmol/L 104  101  96   CO2 22 - 32 mmol/L 28  30  30    Calcium 8.9 - 10.3 mg/dL 9.1  9.6  1.70   Total Protein 6.5 - 8.1 g/dL 7.8  7.1  7.3   Total Bilirubin 0.3 - 1.2 mg/dL 0.7  1.0  1.0    Alkaline Phos 38 - 126 U/L 76     AST 15 - 41 U/L 24  22  27  ALT 0 - 44 U/L 20  16  24     MR ANKLE LEFT WO CONTRAST 06/14/2022  Narrative CLINICAL DATA:  Nonhealing wound along the lateral left ankle, discoloration of the skin.  EXAM: MRI OF THE LEFT ANKLE WITHOUT CONTRAST  TECHNIQUE: Multiplanar, multisequence MR imaging of the ankle was performed. No intravenous contrast was administered.  COMPARISON:  Radiographs 01/14/2013  FINDINGS: TENDONS  Peroneal: Common peroneus tendon sheath tenosynovitis.  Posteromedial: Mild tibialis posterior tenosynovitis.  Anterior: Unremarkable  Achilles: Unremarkable  Plantar Fascia: Mild thickening of the medial and lateral bands of the plantar fascia proximally. Plantar calcaneal spur noted.  LIGAMENTS  Lateral: Thin/attenuated ATFL probably from remote injury. The ATFL does not currently appear overtly discontinuous. No surrounding synovitis.  Medial: Unremarkable  CARTILAGE  Ankle Joint: Unremarkable  Subtalar Joints/Sinus Tarsi: Unremarkable  Bones: Subtle endosteal edema along the lateral malleolus underlying the region of cutaneous irregularity.  Other: Cutaneous thinning and irregularity superficial to the lateral malleolus and tracking along the anterolateral ankle region compatible with ulceration. No drainable abscess observed.  IMPRESSION: 1. Cutaneous thinning and irregularity superficial to the lateral malleolus and tracking along the anterolateral ankle region compatible with ulceration. No drainable abscess observed. 2. Subtle endosteal edema along the lateral malleolus underlying the region of cutaneous irregularity, probably reactive to local inflammation and not of a magnitude to be specific for osteomyelitis at this time. 3. Common peroneus tendon sheath tenosynovitis. Mild tibialis posterior tenosynovitis. 4. Mild thickening of the medial and lateral bands of the plantar fascia proximally,  with plantar calcaneal spur. 5. Thin/attenuated ATFL probably from remote injury. The ATFL does not currently appear overtly discontinuous.   Electronically Signed By: 03/16/2013 M.D. On: 06/14/2022 14:32   Assessment and Plan: * Chronic ulcer of left ankle (HCC) With infection, failed outpt treatment and ABx. LE wound pathway Zosyn for the moment given prior cultures grew pseudomonas and enterococcus. MRSA PCR (nares and ulcer both), add vanc if positive EDP calling ortho for consult (Dr. 08/13/2022 on for no-doc at [facility]). Will admit to [facility], anticipate that 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  Hypertension Cont home BP meds when med rec completed.  Hypothyroidism Cont synthroid when med rec completed.      Advance Care Planning:   Code Status: Full Code   Consults: EDP calling ortho  Family Communication: No family in room  Severity of Illness: The appropriate patient status for this patient is INPATIENT. Inpatient status is judged to be reasonable and necessary in order to provide the required intensity of service to ensure the patient's safety. The patient's presenting symptoms, physical exam findings, and initial radiographic and laboratory data in the context of their chronic comorbidities is felt to place them at high risk for further clinical deterioration. Furthermore, it is not anticipated that the patient will be medically stable for discharge from the hospital within 2 midnights of admission.   * I certify that at the point of admission it is my clinical judgment that the patient will require inpatient hospital care spanning beyond 2 midnights from the point of admission due to high intensity of  service, high risk for further deterioration and high frequency of surveillance  required.*  Author: Etta Quill., DO 06/14/2022 8:33 PM  For on call review [email or web address].

## 2022-06-14 NOTE — Assessment & Plan Note (Addendum)
With infection, failed outpt treatment and ABx. LE wound pathway Zosyn for the moment given prior cultures grew pseudomonas and enterococcus. MRSA PCR (nares and ulcer both), add vanc if positive EDP calling ortho for consult (Dr. Marlou Sa on for no-doc at [facility]). Will admit to [facility], anticipate that Dr. 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

## 2022-06-14 NOTE — Assessment & Plan Note (Signed)
Cont synthroid when med-rec completed. 

## 2022-06-14 NOTE — Progress Notes (Signed)
A consult was received from an ED physician for cefepime per pharmacy dosing.  The patient's profile has been reviewed for ht/wt/allergies/indication/available labs.   A one time order has been placed for cefepime 2g.  Further antibiotics/pharmacy consults should be ordered by admitting physician if indicated.                       Thank you, Peggyann Juba, PharmD, BCPS 06/14/2022  7:48 PM

## 2022-06-14 NOTE — Progress Notes (Signed)
KORTLAND, [name] ([id number]) [id number] Page 1 of 2 Visit Report for 06/14/2022 Chief Complaint Document Details Patient Name: Date of Service: [place], [name] 06/14/2022 8:45 A M Medical Record Number: [id number] Patient Account Number: [id number] Date of Birth/Sex: Treating RN: [birth year] (82 y.o. M) Primary Care Provider: [name] Other Clinician: Referring Provider: Treating Provider/Extender: Azucena Kuba in Treatment: 34 Information Obtained from: Patient Chief Complaint Bilateral foot ulcers Electronic Signature(s) Signed: 06/14/2022 9:05:45 AM By: [name] PA-C Entered By: [name] on 06/14/2022 09:05:45 -------------------------------------------------------------------------------- Problem List Details Patient Name: Date of Service: [place], [name] 06/14/2022 8:45 A M Medical Record Number: [id number] Patient Account Number: [id number] Date of Birth/Sex: Treating RN: [birth year] (82 y.o. 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

## 2022-06-14 NOTE — Assessment & Plan Note (Signed)
Cont home BP meds when med rec completed. 

## 2022-06-14 NOTE — ED Provider Triage Note (Signed)
Emergency Medicine Provider Triage Evaluation Note  [name] Fernandez , a 82 y.o. male  was evaluated in triage.  Pt complains of left has been going on for 3 weeks and now turning black.  Sent by wound care across the street for MRI and vascular studies.  Review of Systems  Positive:  Negative:   Physical Exam  BP (!) 153/84   Pulse 61   Temp 98.2 F (36.8 C) (Oral)   Resp 18   Ht 5\' 6"  (1.676 m)   Wt 81.6 kg   SpO2 97%   BMI 29.05 kg/m  Gen:   Awake, no distress   Resp:  Normal effort  MSK:   Moves extremities without difficulty  Other:    Media Information   Medical Decision Making  Medically screening exam initiated at 12:01 PM.  Appropriate orders placed.  [name] was informed that the remainder of the evaluation will be completed by another provider, this initial triage assessment does not replace that evaluation, and the importance of remaining in the ED until their evaluation is complete.     Rhae Hammock, PA-C 06/14/22 1203

## 2022-06-15 ENCOUNTER — Encounter (HOSPITAL_COMMUNITY): Payer: Self-pay | Admitting: Emergency Medicine

## 2022-06-15 ENCOUNTER — Other Ambulatory Visit: Payer: Self-pay

## 2022-06-15 ENCOUNTER — Inpatient Hospital Stay (HOSPITAL_COMMUNITY)
Admission: EM | Admit: 2022-06-15 | Discharge: 2022-06-21 | DRG: 982 | Disposition: A | Discharge status: Home Health | Payer: Medicare HMO | Attending: Internal Medicine | Admitting: Internal Medicine

## 2022-06-15 DIAGNOSIS — E785 Hyperlipidemia, unspecified: Secondary | ICD-10-CM | POA: Diagnosis present

## 2022-06-15 DIAGNOSIS — M659 Synovitis and tenosynovitis, unspecified: Secondary | ICD-10-CM | POA: Diagnosis present

## 2022-06-15 DIAGNOSIS — G894 Chronic pain syndrome: Secondary | ICD-10-CM | POA: Diagnosis present

## 2022-06-15 DIAGNOSIS — B965 Pseudomonas (aeruginosa) (mallei) (pseudomallei) as the cause of diseases classified elsewhere: Secondary | ICD-10-CM | POA: Diagnosis present

## 2022-06-15 DIAGNOSIS — I10 Essential (primary) hypertension: Secondary | ICD-10-CM | POA: Diagnosis not present

## 2022-06-15 DIAGNOSIS — N182 Chronic kidney disease, stage 2 (mild): Secondary | ICD-10-CM | POA: Diagnosis present

## 2022-06-15 DIAGNOSIS — T148XXA Other injury of unspecified body region, initial encounter: Secondary | ICD-10-CM

## 2022-06-15 DIAGNOSIS — E1122 Type 2 diabetes mellitus with diabetic chronic kidney disease: Secondary | ICD-10-CM | POA: Diagnosis present

## 2022-06-15 DIAGNOSIS — I872 Venous insufficiency (chronic) (peripheral): Secondary | ICD-10-CM | POA: Diagnosis present

## 2022-06-15 DIAGNOSIS — K219 Gastro-esophageal reflux disease without esophagitis: Secondary | ICD-10-CM | POA: Diagnosis present

## 2022-06-15 DIAGNOSIS — L97309 Non-pressure chronic ulcer of unspecified ankle with unspecified severity: Secondary | ICD-10-CM | POA: Diagnosis not present

## 2022-06-15 DIAGNOSIS — Z96651 Presence of right artificial knee joint: Secondary | ICD-10-CM | POA: Diagnosis present

## 2022-06-15 DIAGNOSIS — M86162 Other acute osteomyelitis, left tibia and fibula: Secondary | ICD-10-CM | POA: Diagnosis not present

## 2022-06-15 DIAGNOSIS — M86262 Subacute osteomyelitis, left tibia and fibula: Secondary | ICD-10-CM

## 2022-06-15 DIAGNOSIS — L089 Local infection of the skin and subcutaneous tissue, unspecified: Secondary | ICD-10-CM | POA: Diagnosis not present

## 2022-06-15 DIAGNOSIS — E11622 Type 2 diabetes mellitus with other skin ulcer: Principal | ICD-10-CM

## 2022-06-15 DIAGNOSIS — D649 Anemia, unspecified: Secondary | ICD-10-CM | POA: Diagnosis present

## 2022-06-15 DIAGNOSIS — Z800 Family history of malignant neoplasm of digestive organs: Secondary | ICD-10-CM

## 2022-06-15 DIAGNOSIS — Z87891 Personal history of nicotine dependence: Secondary | ICD-10-CM

## 2022-06-15 DIAGNOSIS — E039 Hypothyroidism, unspecified: Secondary | ICD-10-CM | POA: Diagnosis present

## 2022-06-15 DIAGNOSIS — Z79890 Hormone replacement therapy (postmenopausal): Secondary | ICD-10-CM

## 2022-06-15 DIAGNOSIS — Z881 Allergy status to other antibiotic agents status: Secondary | ICD-10-CM

## 2022-06-15 DIAGNOSIS — Z1623 Resistance to quinolones and fluoroquinolones: Secondary | ICD-10-CM | POA: Diagnosis present

## 2022-06-15 DIAGNOSIS — K900 Celiac disease: Secondary | ICD-10-CM | POA: Diagnosis present

## 2022-06-15 DIAGNOSIS — M76822 Posterior tibial tendinitis, left leg: Secondary | ICD-10-CM | POA: Diagnosis not present

## 2022-06-15 DIAGNOSIS — Z91040 Latex allergy status: Secondary | ICD-10-CM | POA: Diagnosis not present

## 2022-06-15 DIAGNOSIS — D631 Anemia in chronic kidney disease: Secondary | ICD-10-CM | POA: Diagnosis present

## 2022-06-15 DIAGNOSIS — E1169 Type 2 diabetes mellitus with other specified complication: Secondary | ICD-10-CM | POA: Diagnosis not present

## 2022-06-15 DIAGNOSIS — A498 Other bacterial infections of unspecified site: Secondary | ICD-10-CM | POA: Diagnosis not present

## 2022-06-15 DIAGNOSIS — Z888 Allergy status to other drugs, medicaments and biological substances status: Secondary | ICD-10-CM

## 2022-06-15 DIAGNOSIS — I129 Hypertensive chronic kidney disease with stage 1 through stage 4 chronic kidney disease, or unspecified chronic kidney disease: Secondary | ICD-10-CM | POA: Diagnosis present

## 2022-06-15 DIAGNOSIS — M86172 Other acute osteomyelitis, left ankle and foot: Secondary | ICD-10-CM

## 2022-06-15 DIAGNOSIS — J449 Chronic obstructive pulmonary disease, unspecified: Secondary | ICD-10-CM | POA: Diagnosis present

## 2022-06-15 DIAGNOSIS — M65969 Unspecified synovitis and tenosynovitis, unspecified lower leg: Secondary | ICD-10-CM | POA: Diagnosis present

## 2022-06-15 DIAGNOSIS — M869 Osteomyelitis, unspecified: Secondary | ICD-10-CM | POA: Insufficient documentation

## 2022-06-15 DIAGNOSIS — L97329 Non-pressure chronic ulcer of left ankle with unspecified severity: Secondary | ICD-10-CM | POA: Diagnosis present

## 2022-06-15 DIAGNOSIS — S91002A Unspecified open wound, left ankle, initial encounter: Secondary | ICD-10-CM | POA: Diagnosis not present

## 2022-06-15 DIAGNOSIS — E871 Hypo-osmolality and hyponatremia: Secondary | ICD-10-CM | POA: Diagnosis present

## 2022-06-15 DIAGNOSIS — Z9889 Other specified postprocedural states: Secondary | ICD-10-CM | POA: Diagnosis present

## 2022-06-15 DIAGNOSIS — M8618 Other acute osteomyelitis, other site: Secondary | ICD-10-CM | POA: Diagnosis not present

## 2022-06-15 LAB — CBC WITH DIFFERENTIAL/PLATELET
Abs Immature Granulocytes: 0.02 10*3/uL (ref 0.00–0.07)
Basophils Absolute: 0 10*3/uL (ref 0.0–0.1)
Basophils Relative: 1 %
Eosinophils Absolute: 0.2 10*3/uL (ref 0.0–0.5)
Eosinophils Relative: 3 %
HCT: 37.9 % — ABNORMAL LOW (ref 39.0–52.0)
Hemoglobin: 12.9 g/dL — ABNORMAL LOW (ref 13.0–17.0)
Immature Granulocytes: 0 %
Lymphocytes Relative: 23 %
Lymphs Abs: 2 10*3/uL (ref 0.7–4.0)
MCH: 32.5 pg (ref 26.0–34.0)
MCHC: 34 g/dL (ref 30.0–36.0)
MCV: 95.5 fL (ref 80.0–100.0)
Monocytes Absolute: 0.6 10*3/uL (ref 0.1–1.0)
Monocytes Relative: 7 %
Neutro Abs: 5.7 10*3/uL (ref 1.7–7.7)
Neutrophils Relative %: 66 %
Platelets: 429 10*3/uL — ABNORMAL HIGH (ref 150–400)
RBC: 3.97 MIL/uL — ABNORMAL LOW (ref 4.22–5.81)
RDW: 15.4 % (ref 11.5–15.5)
WBC: 8.6 10*3/uL (ref 4.0–10.5)
nRBC: 0 % (ref 0.0–0.2)

## 2022-06-15 LAB — BASIC METABOLIC PANEL
Anion gap: 11 (ref 5–15)
BUN: 16 mg/dL (ref 8–23)
CO2: 25 mmol/L (ref 22–32)
Calcium: 9.2 mg/dL (ref 8.9–10.3)
Chloride: 104 mmol/L (ref 98–111)
Creatinine, Ser: 1.03 mg/dL (ref 0.61–1.24)
GFR, Estimated: >60 mL/min (ref >60)
Glucose, Bld: 164 mg/dL — ABNORMAL HIGH (ref 70–99)
Potassium: 4.4 mmol/L (ref 3.5–5.1)
Sodium: 140 mmol/L (ref 135–145)

## 2022-06-15 LAB — LACTIC ACID, PLASMA
Lactic Acid, Venous: 2.4 mmol/L (ref 0.5–1.9)
Lactic Acid, Venous: 1.7 mmol/L (ref 0.5–1.9)

## 2022-06-15 LAB — C-REACTIVE PROTEIN
CRP: <0.5 mg/dL (ref <1.0)
CRP: 0.8 mg/dL (ref <1.0)

## 2022-06-15 LAB — PREALBUMIN: Prealbumin: 18 mg/dL (ref 18–38)

## 2022-06-15 LAB — SEDIMENTATION RATE: Sed Rate: 32 mm/hr — ABNORMAL HIGH (ref 0–16)

## 2022-06-15 MED ORDER — POLYETHYLENE GLYCOL 3350 17 G PO PACK: 17.0000 g | PACK | Freq: Every day | ORAL | Status: DC | PRN

## 2022-06-15 MED ORDER — VANCOMYCIN HCL IN DEXTROSE 1-5 GM/200ML-% IV SOLN: 1000.0000 mg | Freq: Once | INTRAVENOUS | Status: DC

## 2022-06-15 MED ORDER — SODIUM CHLORIDE 0.9 % IV BOLUS
1000.0000 mL | Freq: Once | INTRAVENOUS | Status: AC
  Administered 2022-06-15: 1000 mL via INTRAVENOUS

## 2022-06-15 MED ORDER — PANTOPRAZOLE SODIUM 40 MG PO TBEC
40.0000 mg | DELAYED_RELEASE_TABLET | Freq: Every day | ORAL | Status: DC
  Administered 2022-06-15 – 2022-06-21 (×7): 40 mg via ORAL
  Filled 2022-06-15 (×7): qty 1

## 2022-06-15 MED ORDER — OMEPRAZOLE MAGNESIUM 20 MG PO TBEC: 20.0000 mg | DELAYED_RELEASE_TABLET | Freq: Every day | ORAL | Status: DC

## 2022-06-15 MED ORDER — ACETAMINOPHEN 325 MG PO TABS
650.0000 mg | ORAL_TABLET | Freq: Four times a day (QID) | ORAL | Status: DC | PRN
  Administered 2022-06-16: 650 mg via ORAL
  Filled 2022-06-15: qty 2

## 2022-06-15 MED ORDER — PIPERACILLIN-TAZOBACTAM 3.375 G IVPB
3.3750 g | Freq: Three times a day (TID) | INTRAVENOUS | Status: AC
  Administered 2022-06-15 – 2022-06-19 (×11): 3.375 g via INTRAVENOUS
  Filled 2022-06-15 (×12): qty 50

## 2022-06-15 MED ORDER — TORSEMIDE 20 MG PO TABS
20.0000 mg | ORAL_TABLET | Freq: Every morning | ORAL | Status: DC
  Administered 2022-06-16 – 2022-06-19 (×4): 20 mg via ORAL
  Filled 2022-06-15 (×4): qty 1

## 2022-06-15 MED ORDER — GABAPENTIN 600 MG PO TABS: 300.0000 mg | ORAL_TABLET | ORAL | Status: DC

## 2022-06-15 MED ORDER — ACETAMINOPHEN 650 MG RE SUPP: 650.0000 mg | Freq: Four times a day (QID) | RECTAL | Status: DC | PRN

## 2022-06-15 MED ORDER — GABAPENTIN 300 MG PO CAPS
300.0000 mg | ORAL_CAPSULE | Freq: Three times a day (TID) | ORAL | Status: DC | PRN
  Administered 2022-06-16: 300 mg via ORAL
  Filled 2022-06-15: qty 1

## 2022-06-15 MED ORDER — SODIUM CHLORIDE 0.9% FLUSH
3.0000 mL | Freq: Two times a day (BID) | INTRAVENOUS | Status: DC
  Administered 2022-06-15 – 2022-06-21 (×12): 3 mL via INTRAVENOUS

## 2022-06-15 MED ORDER — PIPERACILLIN-TAZOBACTAM 3.375 G IVPB 30 MIN
3.3750 g | Freq: Once | INTRAVENOUS | Status: DC
  Administered 2022-06-15: 3.375 g via INTRAVENOUS

## 2022-06-15 NOTE — ED Notes (Signed)
Admitting MD ata bedside.

## 2022-06-15 NOTE — ED Triage Notes (Signed)
Pt reports he was seen yesterday for ulcer on left ankle. PT reports he was supposed to be transferred from [facility] to [facility] to "see a surgeon." Pt reports he chose to leave AMA yesterday.

## 2022-06-15 NOTE — H&P (Signed)
History and Physical   [name] Fernandez FXT:[id number] DOB: 26-May-1941 DOA: 06/15/2022  PCP: Unk Pinto, MD   Patient coming from: Home  Chief Complaint: Wound infection  HPI: [name] Fernandez is a 82 y.o. male with medical history significant of GERD status post Nissen fundoplication, hypothyroidism, hypertension, hyperlipidemia, celiac disease, anemia, COPD, CKD 2, venous insufficiency, chronic pain presenting with worsening wound infection.  Patient has known history of chronic wound infection and he follows with the wound center for this.  This wound has been worsening despite antibiotics for the last 2 weeks.  They recommended ED evaluation.  He was seen at [facility] ED yesterday and admitted from there to [facility].  However due to the wait he left there AMA and came directly to [facility] himself.  He denies fevers, chills, chest pain, shortness of breath, abdominal pain, constipation, diarrhea, nausea, vomiting.  ED Course: Vital signs in the ED stable.  Lab workup included BMP with glucose 164.  CBC with hemoglobin stable at 12.9.  Lactic acid mildly elevated at 2.4.  Yesterday ESR was 40.  MRSA screen was negative.  Blood cultures are pending.  MRI of the left ankle yesterday showed evidence of the ulceration with endosteal edema at the lateral malleolus suspicious for reactive inflammation, and not consistent with osteomyelitis.  Tenosynovitis of the common tibial tendon sheath also noted.  Patient received Zosyn at [place] long and Zosyn here.  Also received a liter of fluids here.  Orthopedics consulted at [facility] long with plan to see patient on arrival here.  Reconsulted here and request n.p.o. and plan for evaluation tomorrow with possible surgery.  Review of Systems: As per HPI otherwise all other systems reviewed and are negative.  Past Medical History:  Diagnosis Date   Anemia    Arthritis    Celiac disease    diagnoses 06/24/12   Esophageal stricture    H/O hiatal  hernia    Hiatal hernia    Hypertension    Hypothyroidism    Prediabetes    Shortness of breath    from oxycodone at times   Vitamin B12 deficiency    Vitamin D deficiency     Past Surgical History:  Procedure Laterality Date   BACK SURGERY     3 diff surgeries for vertebrea broken   EYE SURGERY     bilateral cataract surgery   HERNIA REPAIR  1950's   bilateral groin as child   HIATAL HERNIA REPAIR     JOINT REPLACEMENT  04/2010   right knee replacement   TONSILLECTOMY     as child   TOTAL KNEE REVISION  11/20/2011   Procedure: TOTAL KNEE REVISION;  Surgeon: Mauri Pole, MD;  Location: [facility] ORS;  Service: Orthopedics;  Laterality: Right;  Right Total Knee Revision    Social History  reports that he quit smoking about 41 years ago. His smoking use included cigarettes. He has a 20.00 pack-year smoking history. He has been exposed to tobacco smoke. He has never used smokeless tobacco. He reports that he does not drink alcohol and does not use drugs.  Allergies  Allergen Reactions   Feraheme [Ferumoxytol] Swelling and Other (See Comments)    Swelling of lips and tongue, could not talk 30 minutes after the infusion.   Latex Other (See Comments)    Latex butterfly bandages tore off the skin and caused terrible blisters   Wound Dressing Adhesive Other (See Comments)    Latex butterfly bandages tore  off the skin and caused terrible blisters   Levothyroxine Swelling and Other (See Comments)    Lip swelling   Doxycycline Nausea Only    Family History  Problem Relation Age of Onset   Esophageal cancer Father   Reviewed on admission  Prior to Admission medications   Medication Sig Start Date End Date Taking? Authorizing Provider  acetaminophen (TYLENOL) 500 MG tablet Take 500-1,000 mg by mouth every 6 (six) hours as needed for mild pain or headache.    [provider]  Ascorbic Acid (VITAMIN C) 1000 MG tablet Take 1,000 mg by mouth daily. Patient not taking: Reported  on 06/14/2022    [provider]  Cholecalciferol (VITAMIN D PO) Take 5,000 Int'l Units by mouth daily. BID Patient not taking: Reported on 06/14/2022    [provider]  clotrimazole-betamethasone (LOTRISONE) cream APPLY TO AFFECTED AREA TWICE A DAY Patient taking differently: Apply 1 Application topically See admin instructions. Apply to affected area 2 times a day 10/24/21   Alycia Rossetti, NP  Ferrous Sulfate (IRON) 325 (65 Fe) MG TABS Take by mouth. Patient not taking: Reported on 06/14/2022    [provider]  fluticasone (FLONASE) 50 MCG/ACT nasal spray Place 1-2 sprays into both nostrils daily as needed for allergies or rhinitis.    [provider]  gabapentin (NEURONTIN) 600 MG tablet Take 1/2 to 1 tablet 2 to 3 x /Daily as needed for Chronic Pain Patient taking differently: Take 300-600 mg by mouth See admin instructions. Take 300 mg by mouth at bedtime and an additional 600 mg up to twice a day as needed for pain (is allowed to take up to a sum total of 1,800 mg/day) 10/24/21   Unk Pinto, MD  gentamicin cream (GARAMYCIN) 0.1 % Apply 1 Application topically 2 (two) times daily as needed (for minor infections).    [provider]  hydrOXYzine (ATARAX) 25 MG tablet Take  1 tablet  3 x /day  as needed  for Anxiety or Sleep Patient taking differently: Take 25 mg by mouth at bedtime. 04/04/22   Unk Pinto, MD  NON FORMULARY Take 1 capsule by mouth See admin instructions. Thyroid Care Plus capsules- Take 1 capsule by mouth once a day    [provider]  omeprazole (PRILOSEC OTC) 20 MG tablet Take 20 mg by mouth daily before breakfast.    [provider]  Red Yeast Rice 600 MG CAPS PRN Patient not taking: Reported on 06/14/2022 02/26/21   Unk Pinto, MD  senna (SENOKOT) 8.6 MG TABS tablet Take 1-2 tablets by mouth at bedtime.    [provider]  torsemide (DEMADEX) 20 MG tablet Take  1 tablet  Daily for Fluid  Retention / Leg Swelling Patient taking differently: Take 20 mg by mouth in the morning. 01/20/22   Unk Pinto, MD  triamcinolone cream (KENALOG) 0.5 % Apply 1 application topically 2 (two) times daily. Patient taking differently: Apply 1 application  topically See admin instructions. Apply to left foot and bilateral legs every other day 08/11/20   Unk Pinto, MD    Physical Exam: Vitals:   06/15/22 1223 06/15/22 1604  BP: 134/75 126/78  Pulse: 66 68  Resp: 16 19  Temp: 97.9 F (36.6 C) 97.9 F (36.6 C)  TempSrc: Oral   SpO2: 96% 98%    Physical Exam Constitutional:      General: He is not in acute distress.    Appearance: Normal appearance.  HENT:  Head: Normocephalic and atraumatic.     Mouth/Throat:     Mouth: Mucous membranes are moist.     Pharynx: Oropharynx is clear.  Eyes:     Extraocular Movements: Extraocular movements intact.     Pupils: Pupils are equal, round, and reactive to light.  Cardiovascular:     Rate and Rhythm: Normal rate and regular rhythm.     Pulses: Normal pulses.     Heart sounds: Normal heart sounds.  Pulmonary:     Effort: Pulmonary effort is normal. No respiratory distress.     Breath sounds: Normal breath sounds.  Abdominal:     General: Bowel sounds are normal. There is no distension.     Palpations: Abdomen is soft.     Tenderness: There is no abdominal tenderness.  Musculoskeletal:        General: No swelling.     Comments: Left ankle wound dressed and wrapped with patient declining to have it evaluated but provided images.  Skin:    General: Skin is warm and dry.  Neurological:     General: No focal deficit present.     Mental Status: Mental status is at baseline.      Labs on Admission: I have personally reviewed following labs and imaging studies  CBC: Recent Labs  Lab 06/14/22 1315 06/15/22 1305  WBC 9.4 8.6  NEUTROABS 6.6 5.7  HGB 13.3 12.9*  HCT 39.8 37.9*  MCV 94.8 95.5  PLT 459* 429*    Basic  Metabolic Panel: Recent Labs  Lab 06/14/22 1315 06/15/22 1305  NA 139 140  K 4.0 4.4  CL 104 104  CO2 28 25  GLUCOSE 104* 164*  BUN 19 16  CREATININE 0.86 1.03  CALCIUM 9.1 9.2    GFR: Estimated Creatinine Clearance: 56.4 mL/min (by C-G formula based on SCr of 1.03 mg/dL).  Liver Function Tests: Recent Labs  Lab 06/14/22 1315  AST 24  ALT 20  ALKPHOS 76  BILITOT 0.7  PROT 7.8  ALBUMIN 3.8    Urine analysis:    Component Value Date/Time   COLORURINE DARK YELLOW 04/04/2022 1450   APPEARANCEUR CLEAR 04/04/2022 1450   LABSPEC 1.010 04/04/2022 1450   PHURINE 7.0 04/04/2022 1450   GLUCOSEU NEGATIVE 04/04/2022 1450   HGBUR NEGATIVE 04/04/2022 1450   BILIRUBINUR NEGATIVE 06/16/2016 0958   KETONESUR NEGATIVE 04/04/2022 1450   PROTEINUR NEGATIVE 04/04/2022 1450   UROBILINOGEN 0.2 05/20/2014 1108   NITRITE NEGATIVE 04/04/2022 1450   LEUKOCYTESUR NEGATIVE 04/04/2022 1450    Radiological Exams on Admission: MR ANKLE LEFT WO CONTRAST  Result Date: 06/14/2022 CLINICAL DATA:  Nonhealing wound along the lateral left ankle, discoloration of the skin. EXAM: MRI OF THE LEFT ANKLE WITHOUT CONTRAST TECHNIQUE: Multiplanar, multisequence MR imaging of the ankle was performed. No intravenous contrast was administered. COMPARISON:  Radiographs 01/14/2013 FINDINGS: TENDONS Peroneal: Common peroneus tendon sheath tenosynovitis. Posteromedial: Mild tibialis posterior tenosynovitis. Anterior: Unremarkable Achilles: Unremarkable Plantar Fascia: Mild thickening of the medial and lateral bands of the plantar fascia proximally. Plantar calcaneal spur noted. LIGAMENTS Lateral: Thin/attenuated ATFL probably from remote injury. The ATFL does not currently appear overtly discontinuous. No surrounding synovitis. Medial: Unremarkable CARTILAGE Ankle Joint: Unremarkable Subtalar Joints/Sinus Tarsi: Unremarkable Bones: Subtle endosteal edema along the lateral malleolus underlying the region of cutaneous  irregularity. Other: Cutaneous thinning and irregularity superficial to the lateral malleolus and tracking along the anterolateral ankle region compatible with ulceration. No drainable abscess observed. IMPRESSION: 1. Cutaneous thinning and irregularity superficial to the  lateral malleolus and tracking along the anterolateral ankle region compatible with ulceration. No drainable abscess observed. 2. Subtle endosteal edema along the lateral malleolus underlying the region of cutaneous irregularity, probably reactive to local inflammation and not of a magnitude to be specific for osteomyelitis at this time. 3. Common peroneus tendon sheath tenosynovitis. Mild tibialis posterior tenosynovitis. 4. Mild thickening of the medial and lateral bands of the plantar fascia proximally, with plantar calcaneal spur. 5. Thin/attenuated ATFL probably from remote injury. The ATFL does not currently appear overtly discontinuous. Electronically Signed   By: Van Clines M.D.   On: 06/14/2022 14:32    EKG: Not performed in the emergency department.  Assessment/Plan Principal Problem:   Tenosynovitis Active Problems:   Hypothyroidism   Hypertension   GERD (gastroesophageal reflux disease)   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 tube) procedure   Celiac disease   Hyperlipidemia   Chronic pain syndrome   CKD (chronic kidney disease) stage 2, GFR 60-89 ml/min   COPD (chronic obstructive pulmonary disease) (HCC)   Chronic venous insufficiency   Anemia   Tenosynovitis Worsening wound infection > Chronic wound infection follows with wound care center.  Has been worsening despite p.o. antibiotics and was told to come to the ED yesterday. > Initially presented to the ED at [facility] yesterday and was admitted to [facility] from there, but due to the wait he left AMA and came to [facility] directly himself. > Currently no leukocytosis.  Borderline lactic acid.  ESR yesterday was 40, will repeat. > MRI with  evidence of ulcer, tenosynovitis, suspected reactive endosteal edema. > Started on Zosyn at [place] long, received additional dose of Zosyn here in the ED.  MRSA screen was negative so hold off on vancomycin. > Orthopedics consulted and will see the patient - Appreciate orthopedics recommendations - Monitor on MedSurg with pulse ox - Continue with Zosyn - Repeat CRP and ESR - Follow-up blood cultures - N.p.o. at midnight - Follow fever curve and WBC - Gabapentin for pain (patient will like to avoid opioids due to history of prescription opioid use disorder)  CKD 2 > Creatinine stable in the ED - Continue to trend renal function and electrolytes  Hypertension - Continue home meds when med rec performed  Hypothyroidism - Continue home Synthroid after med rec performed  Hyperlipidemia - On chart, need to confirm if taking medications  COPD - Not currently on any inhalers, will need to confirm  DVT prophylaxis: SCDs Code Status:   Full Family Communication:  Updated at bedside  Disposition Plan:   Patient is from:  Home  Anticipated DC to:  Home  Anticipated DC date:  1 to 2 days  Anticipated DC barriers: None  Consults called:  Orthopedics Admission status:  Observation, MedSurg  Severity of Illness: The appropriate patient status for this patient is OBSERVATION. Observation status is judged to be reasonable and necessary in order to provide the required intensity of service to ensure the patient's safety. The patient's presenting symptoms, physical exam findings, and initial radiographic and laboratory data in the context of their medical condition is felt to place them at decreased risk for further clinical deterioration. Furthermore, it is anticipated that the patient will be medically stable for discharge from the hospital within 2 midnights of admission.    Marcelyn Bruins MD [facility]  How to contact the [facility] Attending or Consulting provider [place] or  covering provider during after hours [place], for this patient?  Check the care team in [facility] and look for a) attending/consulting [facility] provider listed and b) the [facility] team listed Log into [email or web address] and use Jessie's universal password to access. If you do not have the password, please contact the hospital operator. Locate the [facility] provider you are looking for under [facility] and page to a number that you can be directly reached. If you still have difficulty reaching the provider, please page the [facility] (Director on Call) for the Hospitalists listed on amion for assistance.  06/15/2022, 4:59 PM

## 2022-06-15 NOTE — ED Triage Notes (Signed)
Critical labs called to Carlis Abbott ,RN charge Lactic 2,4

## 2022-06-15 NOTE — Progress Notes (Signed)
Contacted regarding Mr. [name] Fernandez earlier this evening.  It appears that he was over at [facility] where an MRI was obtained for chronic appearing lateral ankle ulcer.  This showed some signs consistent with the above.  No acute issues ongoing.  However, due to some concerns from the family he was going to be admitted.  He then left the ER AGAINST MEDICAL ADVICE from [facility].  There was a conversation had yesterday with Dr. Marlou Sa which he did recommend transfer to [facility] to have Dr. Sharol Given take a look.  I believe this is still a good plan.  Please call [name] Fernandez in the morning after 8 AM to triage the patient and have Dr. Sharol Given as the staffing orthopedic surgeon.  Again, this is a chronic issue this been ongoing for months and should be appropriate to have evaluated tomorrow by the appropriate provider.

## 2022-06-15 NOTE — Progress Notes (Signed)
AISEA, BOULDIN ([id number]) [id number] Page 1 of 8 Visit Report for 05/17/2022 Arrival Information Details Patient Name: Date of Service: [place], [name] LD C. 05/17/2022 10:15 A M Medical Record Number: [id number] Patient Account Number: [id number] Date of Birth/Sex: Treating RN: [birth year] (82 y.o. M) Primary Care Antoinett Dorman: Marinus Maw Other Clinician: Referring Elysabeth Aust: Treating Cianni Manny/Extender: Williemae Area in Treatment: 30 Visit Information History Since Last Visit Added or deleted any medications: No Patient Arrived: Ambulatory Any new allergies or adverse reactions: No Arrival Time: 10:19 Had a fall or experienced change in No Accompanied By: self activities of daily living that may affect Transfer Assistance: None risk of falls: Patient Identification Verified: Yes Signs or symptoms of abuse/neglect since last visito No Secondary Verification Process Completed: Yes Hospitalized since last visit: No Patient Requires Transmission-Based Precautions: No Implantable device outside of the clinic excluding No Patient Has Alerts: No cellular tissue based products placed in the center since last visit: Has Compression in Place as Prescribed: Yes Pain Present Now: Yes Electronic Signature(s) Signed: 05/17/2022 4:44:51 PM By: Thayer Dallas Entered By: Thayer Dallas on 05/17/2022 10:20:05 -------------------------------------------------------------------------------- Clinic Level of Care Assessment Details Patient Name: Date of Service: [place], [name] LD C. 05/17/2022 10:15 A M Medical Record Number: [id number] Patient Account Number: [id number] Date of Birth/Sex: Treating RN: 1940-11-18 (82 y.o. Charlean Merl, Lauren Primary Care Pecola Haxton: Marinus Maw Other Clinician: Referring Polette Nofsinger: Treating Angeliki Mates/Extender: Williemae Area in Treatment: 30 Clinic Level of Care Assessment  Items TOOL 4 Quantity Score X- 1 0 Use when only an EandM is performed on FOLLOW-UP visit ASSESSMENTS - Nursing Assessment / Reassessment X- 1 10 Reassessment of Co-morbidities (includes updates in patient status) X- 1 5 Reassessment of Adherence to Treatment Plan ASSESSMENTS - Wound and Skin A ssessment / Reassessment X - Simple Wound Assessment / Reassessment - one wound 1 5 []  - 0 Complex Wound Assessment / Reassessment - multiple wounds []  - 0 Dermatologic / Skin Assessment (not related to wound area) ASSESSMENTS - Focused Assessment X- 1 5 Circumferential Edema Measurements - multi extremities []  - 0 Nutritional Assessment / Counseling / Intervention TRYONE, KILLE ( ) [id number] Page 2 of 8 []  - 0 Lower Extremity Assessment (monofilament, tuning fork, pulses) []  - 0 Peripheral Arterial Disease Assessment (using hand held doppler) ASSESSMENTS - Ostomy and/or Continence Assessment and Care []  - 0 Incontinence Assessment and Management []  - 0 Ostomy Care Assessment and Management (repouching, etc.) PROCESS - Coordination of Care X - Simple Patient / Family Education for ongoing care 1 15 []  - 0 Complex (extensive) Patient / Family Education for ongoing care X- 1 10 Staff obtains Oletta Lamas, Records, T Results / Process Orders est []  - 0 Staff telephones HHA, Nursing Homes / Clarify orders / etc []  - 0 Routine Transfer to another Facility (non-emergent condition) []  - 0 Routine Hospital Admission (non-emergent condition) []  - 0 New Admissions / [id number] / Ordering NPWT Apligraf, etc. , []  - 0 Emergency Hospital Admission (emergent condition) X- 1 10 Simple Discharge Coordination []  - 0 Complex (extensive) Discharge Coordination PROCESS - Special Needs []  - 0 Pediatric / Minor Patient Management []  - 0 Isolation Patient Management []  - 0 Hearing / Language / Visual special needs []  - 0 Assessment of  Community assistance (transportation, D/C planning, etc.) []  - 0 Additional assistance / Altered mentation []  - 0 Support Surface(s) Assessment (bed, cushion, seat, etc.) INTERVENTIONS - Wound Cleansing /  Measurement X - Simple Wound Cleansing - one wound 1 5 []  - 0 Complex Wound Cleansing - multiple wounds X- 1 5 Wound Imaging (photographs - any number of wounds) []  - 0 Wound Tracing (instead of photographs) X- 1 5 Simple Wound Measurement - one wound []  - 0 Complex Wound Measurement - multiple wounds INTERVENTIONS - Wound Dressings []  - 0 Small Wound Dressing one or multiple wounds X- 1 15 Medium Wound Dressing one or multiple wounds []  - 0 Large Wound Dressing one or multiple wounds X- 1 5 Application of Medications - topical []  - 0 Application of Medications - injection INTERVENTIONS - Miscellaneous []  - 0 External ear exam []  - 0 Specimen Collection (cultures, biopsies, blood, body fluids, etc.) []  - 0 Specimen(s) / Culture(s) sent or taken to Lab for analysis []  - 0 Patient Transfer (multiple staff / Civil Service fast streamer / Similar devices) []  - 0 Simple Staple / Suture removal (25 or less) []  - 0 Complex Staple / Suture removal (26 or more) []  - 0 Hypo / Hyperglycemic Management (close monitor of Blood Glucose) DEVANTA, DANIEL ([id number]) [id number] Page 3 of 8 []  - 0 Ankle / Brachial Index (ABI) - do not check if billed separately X- 1 5 Vital Signs Has the patient been seen at the hospital within the last three years: Yes Total Score: 100 Level Of Care: New/Established - Level 3 Electronic Signature(s) Signed: 06/14/2022 5:35:40 PM By: Rhae Hammock RN Entered By: Rhae Hammock on 05/17/2022 10:58:28 -------------------------------------------------------------------------------- Encounter Discharge Information Details Patient Name: Date of Service: [place], [name] 05/17/2022 10:15 A M Medical Record Number: [id number] Patient  Account Number: [id number] Date of Birth/Sex: Treating RN: 01-09-1941 (82 y.o. Burnadette Pop, Lauren Primary Care Rhonna Holster: [name] Other Clinician: Referring Olia Hinderliter: Treating Graylee Arutyunyan/Extender: Azucena Kuba in Treatment: 30 Encounter Discharge Information Items Discharge Condition: Stable Ambulatory Status: Ambulatory Discharge Destination: Home Transportation: Private Auto Accompanied By: self Schedule Follow-up Appointment: Yes Clinical Summary of Care: Patient Declined Electronic Signature(s) Signed: 06/14/2022 5:35:40 PM By: Rhae Hammock RN Entered By: Rhae Hammock on 05/29/2022 07:59:44 -------------------------------------------------------------------------------- Lower Extremity Assessment Details Patient Name: Date of Service: [place], [place] [name] 05/17/2022 10:15 A M Medical Record Number: [id number] Patient Account Number: [id number] Date of Birth/Sex: Treating RN: [birth year] (82 y.o. M) Primary Care Costantino Kohlbeck: [name] Other Clinician: Referring Jakki Doughty: Treating Dawna Jakes/Extender: Azucena Kuba in Treatment: 30 Edema Assessment Assessed: [Left: No] Patrice Paradise: No] Edema: [Left: Ye] [Right: s] Calf Left: Right: Point of Measurement: 32 cm From Medial Instep 38.5 cm Ankle Left: Right: Point of Measurement: 10 cm From Medial Instep 25.4 cm Electronic Signature(s) Signed: 05/17/2022 4:44:51 PM By: Elbert Ewings, Jiyan 05/17/2022 4:44:51 PM By: Erenest Blank SignedLoletha Grayer ([id number]) [id number] Page 4 of 8 Entered By: Erenest Blank on 05/17/2022 10:27:54 -------------------------------------------------------------------------------- Multi-Disciplinary Care Plan Details Patient Name: Date of Service: [place], [name] 05/17/2022 10:15 A M Medical Record Number: [id number] Patient Account Number: [id number] Date of Birth/Sex: Treating RN: 04/03/1941 (82  y.o. 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 Compression as ordered Notes: 11/16/21: Edema not controlled,  patient not compliant with compression. 12/14/21:Edema not well controlled, patient will not allow compression wraps. 02/15/22: Edema better controlled, patient using ACE wrap Wound/Skin Impairment Nursing Diagnoses: Impaired tissue integrity Goals: Patient/caregiver will verbalize understanding of skin care regimen Date Initiated: 10/19/2021 Target Resolution Date: 05/04/2022 Goal Status: Active Ulcer/skin breakdown will have a volume reduction of 30% by week 4 Date Initiated: 10/19/2021 Date Inactivated: 12/14/2021 Target Resolution Date: 12/14/2021 Unmet Reason: infection, non Goal Status: Unmet compliance Interventions: Assess patient/caregiver ability to obtain necessary supplies Assess patient/caregiver ability to perform ulcer/skin care regimen upon admission and as needed Assess ulceration(s) every visit Provide education on ulcer and skin care Treatment Activities: Topical wound management initiated : 10/19/2021 Notes: 11/16/21: Wound care continues, patient not compliant with edema control. 02/15/22: Wound care regimen continues. Large co-pay for skin subs. Electronic Signature(s) Signed: 06/14/2022 5:35:40 PM By: Rhae Hammock RN Entered By: Rhae Hammock on 05/17/2022 10:40:16 [name] ([id number]) [id number] Page 5 of  8 -------------------------------------------------------------------------------- Pain Assessment Details Patient Name: Date of Service: [place], [name] 05/17/2022 10:15 A M Medical Record Number: [id number] Patient Account Number: [id number] Date of Birth/Sex: Treating RN: 12-03-1940 (82 y.o. M) Primary Care Korrina Zern: [name] Other Clinician: Referring Treven Holtman: Treating Helma Argyle/Extender: Azucena Kuba in Treatment: 30 Active Problems Location of Pain Severity and Description of Pain Patient Has Paino Yes Site Locations Pain Location: Pain in Ulcers Rate the pain. Current Pain Level: 5 Pain Management and Medication Current Pain Management: Electronic Signature(s) Signed: 05/17/2022 4:44:51 PM By: Erenest Blank Entered By: Erenest Blank on 05/17/2022 10:22:09 -------------------------------------------------------------------------------- Patient/Caregiver Education Details Patient Name: Date of Service: [place]ace [place] [name] 12/6/2023andnbsp10:15 A M Medical Record Number: [id number] Patient Account Number: [id number] Date of Birth/Gender: Treating RN: 03-23-1941 (82 y.o. Erie Noe Primary Care Physician: [name] Other Clinician: Referring Physician: Treating Physician/Extender: Azucena Kuba in Treatment: 30 Education Assessment Education Provided To: Patient Education Topics Provided Wound/Skin Impairment: Methods: Explain/Verbal Responses: Reinforcements needed, State content correctly HUNTER, BACHAR ([id number]) [id number] Page 6 of 8 Electronic Signature(s) Signed: 06/14/2022 5:35:40 PM By: Rhae Hammock RN Entered By: Rhae Hammock on 05/17/2022 10:40:31 -------------------------------------------------------------------------------- Wound Assessment Details Patient Name: Date of Service: [place], [name] 05/17/2022 10:15 A M Medical  Record Number: [id number] Patient Account Number: [id number] Date of Birth/Sex: Treating RN: 1940/12/25 (82 y.o. 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, Lateral Foot Wound Open Wounding Event: Gradually Appeared Status: Date Acquired: 07/25/2021 Comorbid Anemia, Chronic Obstructive Pulmonary Disease (COPD), Weeks Of Treatment: 30 History: Hypertension, Peripheral Venous Disease, Type II Diabetes, Clustered Wound: No Osteoarthritis, Neuropathy Photos Wound Measurements Length: (cm) 6 Width: (cm) 7.2 Depth: (cm) 0.2 Area: (cm) 33.929 Volume: (cm) 6.786 % Reduction in Area: -74.2% % Reduction in Volume: -74.2% Epithelialization: Small (1-33%) Tunneling: No Undermining: No Wound Description Classification: Full Thickness Without Exposed Suppor Wound Margin: Distinct, outline attached Exudate Amount: Large Exudate Type: Serosanguineous Exudate Color: red, brown t Structures Foul Odor After Cleansing: No Slough/Fibrino Yes Wound Bed Granulation Amount: Large (67-100%) Exposed Structure Granulation Quality: Red, Pink, Hyper-granulation Fascia Exposed: No Necrotic Amount: Small (1-33%) Fat Layer (Subcutaneous Tissue) Exposed: Yes Necrotic Quality: Adherent Slough Tendon Exposed: No Muscle Exposed: No Joint Exposed: No Bone Exposed: No Periwound Skin Texture Texture Color No Abnormalities Noted:  Yes No Abnormalities Noted: No Atrophie Blanche: No Moisture Cyanosis: No No Abnormalities Noted: Yes Ecchymosis: No ISIDORE, MARGRAF ([id number]) [id number] Page 7 of 8 Erythema: No Hemosiderin Staining: No Mottled: No Pallor: No Rubor: No Temperature / Pain Temperature: No Abnormality Electronic Signature(s) Signed: 05/17/2022 4:44:51 PM  By: Thayer Dallas Entered By: Thayer Dallas on 05/17/2022 10:29:48 -------------------------------------------------------------------------------- Wound Assessment Details Patient Name: Date of Service: [place], [place] [name] 05/17/2022 10:15 A M Medical Record Number: [id number] Patient Account Number: [id number] Date of Birth/Sex: Treating RN: 12/17/1940 (82 y.o. 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, Lateral Foot Wound Open Wounding Event: Gradually Appeared Status: Date Acquired: 07/25/2021 Comorbid Anemia, Chronic Obstructive Pulmonary Disease (COPD), Weeks Of Treatment: 30 History: Hypertension, Peripheral Venous Disease, Type II Diabetes, Clustered Wound: No Osteoarthritis, Neuropathy Wound Measurements Length: (cm) 6 Width: (cm) 7.2 Depth: (cm) 0.2 Area: (cm) 33.929 Volume: (cm) 6.786 % Reduction in Area: -74.2% % Reduction in Volume: -74.2% Epithelialization: Small (1-33%) Tunneling: No Undermining: No Wound Description Classification: Full Thickness Without Exposed Suppor Wound Margin: Distinct, outline attached Exudate Amount: Large Exudate Type: Serosanguineous Exudate Color: red, brown t Structures Foul Odor After Cleansing: No Slough/Fibrino Yes Wound Bed Granulation Amount: Large (67-100%) Exposed Structure Granulation Quality: Red, Pink, Hyper-granulation Fascia Exposed: No Necrotic Amount: Small (1-33%) Fat Layer (Subcutaneous Tissue) Exposed: Yes Necrotic Quality: Adherent Slough Tendon Exposed: No Muscle Exposed: No Joint Exposed: No Bone Exposed: No Periwound Skin Texture Texture Color No Abnormalities Noted: Yes No Abnormalities Noted: No Atrophie Blanche: No Moisture Cyanosis: No No Abnormalities Noted: No Ecchymosis: No Dry /  Scaly: No Erythema: No Maceration: Yes Hemosiderin Staining: No Mottled: No Pallor: No Rubor: No Temperature / Pain Temperature: No Abnormality MAMADOU, BREON ([id number]) [id number] Page 8 of 8 Electronic Signature(s) Signed: 05/17/2022 4:44:51 PM By: Thayer Dallas Entered By: Thayer Dallas on 05/17/2022 10:30:22 -------------------------------------------------------------------------------- Vitals Details Patient Name: Date of Service: Katrinka [place], [place] [name] 05/17/2022 10:15 A M Medical Record Number: [id number] Patient Account Number: [id number] Date of Birth/Sex: Treating RN: [birth year] (82 y.o. M) Primary Care Coltin Casher: Marinus Maw Other Clinician: Referring Curstin Schmale: Treating Lola Lofaro/Extender: Williemae Area in Treatment: 30 Vital Signs Time Taken: 10:21 Pulse (bpm): 63 Height (in): 66 Respiratory Rate (breaths/min): 18 Weight (lbs): 184 Blood Pressure (mmHg): 121/69 Body Mass Index (BMI): 29.7 Reference Range: 80 - 120 mg / dl Electronic Signature(s) Signed: 05/17/2022 4:44:51 PM By: Thayer Dallas Entered By: Thayer Dallas on 05/17/2022 10:21:58

## 2022-06-15 NOTE — ED Provider Notes (Signed)
[facility] EMERGENCY DEPARTMENT Provider Note   CSN: [id number] Arrival date & time: 06/15/22  1035     History  Chief Complaint  Patient presents with   Wound Check    [name] Fernandez is a 82 y.o. male, history of chronic venous insufficiency, who presents to the ED secondary to a wound on his left ankle it has been going on for the last several months.  He states that he has tried antibiotics, going to the wound center, without relief.  Last antibiotic was Levaquin.  States that it is getting worse, and that he called his podiatrist, and was told to get go to the ER.  He was admitted to [facility] yesterday, but left after waiting for a long time to get a room.  He denies any fevers or chills, just states that his left ankle is very painful.     Home Medications Prior to Admission medications   Medication Sig Start Date End Date Taking? Authorizing Provider  acetaminophen (TYLENOL) 500 MG tablet Take 500-1,000 mg by mouth every 6 (six) hours as needed for mild pain or headache.    [provider]  Ascorbic Acid (VITAMIN C) 1000 MG tablet Take 1,000 mg by mouth daily. Patient not taking: Reported on 06/14/2022    [provider]  Cholecalciferol (VITAMIN D PO) Take 5,000 Int'l Units by mouth daily. BID Patient not taking: Reported on 06/14/2022    [provider]  clotrimazole-betamethasone (LOTRISONE) cream APPLY TO AFFECTED AREA TWICE A DAY Patient taking differently: Apply 1 Application topically See admin instructions. Apply to affected area 2 times a day 10/24/21   Raynelle Dick, NP  Ferrous Sulfate (IRON) 325 (65 Fe) MG TABS Take by mouth. Patient not taking: Reported on 06/14/2022    [provider]  fluticasone (FLONASE) 50 MCG/ACT nasal spray Place 1-2 sprays into both nostrils daily as needed for allergies or rhinitis.    [provider]  gabapentin (NEURONTIN) 600 MG tablet Take 1/2 to 1 tablet 2 to 3 x /Daily as  needed for Chronic Pain Patient taking differently: Take 300-600 mg by mouth See admin instructions. Take 300 mg by mouth at bedtime and an additional 600 mg up to twice a day as needed for pain (is allowed to take up to a sum total of 1,800 mg/day) 10/24/21   Lucky Cowboy, MD  gentamicin cream (GARAMYCIN) 0.1 % Apply 1 Application topically 2 (two) times daily as needed (for minor infections).    [provider]  hydrOXYzine (ATARAX) 25 MG tablet Take  1 tablet  3 x /day  as needed  for Anxiety or Sleep Patient taking differently: Take 25 mg by mouth at bedtime. 04/04/22   Lucky Cowboy, MD  NON FORMULARY Take 1 capsule by mouth See admin instructions. Thyroid Care Plus capsules- Take 1 capsule by mouth once a day    [provider]  omeprazole (PRILOSEC OTC) 20 MG tablet Take 20 mg by mouth daily before breakfast.    [provider]  Red Yeast Rice 600 MG CAPS PRN Patient not taking: Reported on 06/14/2022 02/26/21   Lucky Cowboy, MD  senna (SENOKOT) 8.6 MG TABS tablet Take 1-2 tablets by mouth at bedtime.    [provider]  torsemide (DEMADEX) 20 MG tablet Take  1 tablet  Daily for Fluid Retention / Leg Swelling Patient taking differently: Take 20 mg by mouth in the morning. 01/20/22   Lucky Cowboy, MD  triamcinolone  cream (KENALOG) 0.5 % Apply 1 application topically 2 (two) times daily. Patient taking differently: Apply 1 application  topically See admin instructions. Apply to left foot and bilateral legs every other day 08/11/20   Unk Pinto, MD      Allergies    Feraheme [ferumoxytol], Latex, Wound dressing adhesive, Levothyroxine, and Doxycycline    Review of Systems   Review of Systems  Constitutional:  Negative for fever.  Skin:  Positive for wound.    Physical Exam Updated Vital Signs BP 126/78 (BP Location: Right Arm)   Pulse 68   Temp 97.9 F (36.6 C)   Resp 19   SpO2 98%  Physical Exam Vitals and nursing note reviewed.   Constitutional:      General: He is not in acute distress.    Appearance: He is well-developed.  HENT:     Head: Normocephalic and atraumatic.  Eyes:     Conjunctiva/sclera: Conjunctivae normal.  Cardiovascular:     Rate and Rhythm: Normal rate and regular rhythm.     Heart sounds: No murmur heard. Pulmonary:     Effort: Pulmonary effort is normal. No respiratory distress.     Breath sounds: Normal breath sounds.  Abdominal:     Palpations: Abdomen is soft.     Tenderness: There is no abdominal tenderness.  Musculoskeletal:        General: No swelling.     Cervical back: Neck supple.  Skin:    General: Skin is warm.     Capillary Refill: Capillary refill takes less than 2 seconds.     Comments: Large wound on left lateral ankle, with necrotic tissue  Neurological:     Mental Status: He is alert.  Psychiatric:        Mood and Affect: Mood normal.     ED Results / Procedures / Treatments   Labs (all labs ordered are listed, but only abnormal results are displayed) Labs Reviewed  BASIC METABOLIC PANEL - Abnormal; Notable for the following components:      Result Value   Glucose, Bld 164 (*)    All other components within normal limits  CBC WITH DIFFERENTIAL/PLATELET - Abnormal; Notable for the following components:   RBC 3.97 (*)    Hemoglobin 12.9 (*)    HCT 37.9 (*)    Platelets 429 (*)    All other components within normal limits  LACTIC ACID, PLASMA - Abnormal; Notable for the following components:   Lactic Acid, Venous 2.4 (*)    All other components within normal limits  CULTURE, BLOOD (ROUTINE X 2)  CULTURE, BLOOD (ROUTINE X 2)  LACTIC ACID, PLASMA  SEDIMENTATION RATE  C-REACTIVE PROTEIN    EKG None  Radiology MR ANKLE LEFT WO CONTRAST  Result Date: 06/14/2022 CLINICAL DATA:  Nonhealing wound along the lateral left ankle, discoloration of the skin. EXAM: MRI OF THE LEFT ANKLE WITHOUT CONTRAST TECHNIQUE: Multiplanar, multisequence MR imaging of the  ankle was performed. No intravenous contrast was administered. COMPARISON:  Radiographs 01/14/2013 FINDINGS: TENDONS Peroneal: Common peroneus tendon sheath tenosynovitis. Posteromedial: Mild tibialis posterior tenosynovitis. Anterior: Unremarkable Achilles: Unremarkable Plantar Fascia: Mild thickening of the medial and lateral bands of the plantar fascia proximally. Plantar calcaneal spur noted. LIGAMENTS Lateral: Thin/attenuated ATFL probably from remote injury. The ATFL does not currently appear overtly discontinuous. No surrounding synovitis. Medial: Unremarkable CARTILAGE Ankle Joint: Unremarkable Subtalar Joints/Sinus Tarsi: Unremarkable Bones: Subtle endosteal edema along the lateral malleolus underlying the region of cutaneous irregularity. Other: Cutaneous thinning and  irregularity superficial to the lateral malleolus and tracking along the anterolateral ankle region compatible with ulceration. No drainable abscess observed. IMPRESSION: 1. Cutaneous thinning and irregularity superficial to the lateral malleolus and tracking along the anterolateral ankle region compatible with ulceration. No drainable abscess observed. 2. Subtle endosteal edema along the lateral malleolus underlying the region of cutaneous irregularity, probably reactive to local inflammation and not of a magnitude to be specific for osteomyelitis at this time. 3. Common peroneus tendon sheath tenosynovitis. Mild tibialis posterior tenosynovitis. 4. Mild thickening of the medial and lateral bands of the plantar fascia proximally, with plantar calcaneal spur. 5. Thin/attenuated ATFL probably from remote injury. The ATFL does not currently appear overtly discontinuous. Electronically Signed   By: [name] Fernandez M.D.   On: 06/14/2022 14:32    Procedures Procedures    Medications Ordered in ED Medications  sodium chloride 0.9 % bolus 1,000 mL (has no administration in time range)  piperacillin-tazobactam (ZOSYN) IVPB 3.375 g (has no  administration in time range)  torsemide (DEMADEX) tablet 20 mg (has no administration in time range)  omeprazole (PRILOSEC OTC) EC tablet 20 mg (has no administration in time range)  gabapentin (NEURONTIN) tablet 300-600 mg (has no administration in time range)  sodium chloride flush (NS) 0.9 % injection 3 mL (has no administration in time range)  acetaminophen (TYLENOL) tablet 650 mg (has no administration in time range)    Or  acetaminophen (TYLENOL) suppository 650 mg (has no administration in time range)  polyethylene glycol (MIRALAX / GLYCOLAX) packet 17 g (has no administration in time range)    ED Course/ Medical Decision Making/ A&P                           Medical Decision Making Patient is a 82 year old male, here for worsening wound on his foot, was supposed to be admitted to hospitalist at [facility] yesterday, but left prior to getting a room.  He is here again.  His MRI showed tenosynovitis, his lactate today is elevated at 2.4.  We will still start him on IV antibiotics, including Zosyn, after discussion with pharmacy.  Additionally I consulted Dr. Stann Mainland of orthopedics, and he states he will speak with Dr. Sharol Given and he will follow.  Keep patient n.p.o. after midnight in case Dr. Sharol Given wants to take to the OR.  Admitted to Dr. Trilby Drummer, hospitalist.   Amount and/or Complexity of Data Reviewed Labs:     Details: Elevated lactate of 2.4 Discussion of management or test interpretation with external provider(s): See above  Risk Decision regarding hospitalization.   Final Clinical Impression(s) / ED Diagnoses Final diagnoses:  Tenosynovitis of tibialis posterior tendon  Wound infection    Rx / DC Orders ED Discharge Orders     None         [name] Fernandez, [name] Gaul, PA 06/15/22 1703    Milton Ferguson, MD 06/17/22 1208

## 2022-06-15 NOTE — Progress Notes (Signed)
Pharmacy Antibiotic Note  [name] Fernandez is a 82 y.o. male admitted on 06/15/2022 with  wound infection .  Pharmacy has been consulted for Zosyn dosing. 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   Plan: Zosyn 3.375g IV q8h (4 hour infusion). Follow culture data for de-escalation.  Monitor renal function for dose adjustments as indicated.     Temp (24hrs), Avg:97.9 F (36.6 C), Min:97.8 F (36.6 C), Max:97.9 F (36.6 C)  Recent Labs  Lab 06/14/22 1315 06/15/22 1305  WBC 9.4 8.6  CREATININE 0.86 1.03  LATICACIDVEN 1.1 2.4*    Estimated Creatinine Clearance: 56.4 mL/min (by C-G formula based on SCr of 1.03 mg/dL).    Allergies  Allergen Reactions   Feraheme [Ferumoxytol] Swelling and Other (See Comments)    Swelling of lips and tongue, could not talk 30 minutes after the infusion.   Latex Other (See Comments)    Latex butterfly bandages tore off the skin and caused terrible blisters   Wound Dressing Adhesive Other (See Comments)    Latex butterfly bandages tore off the skin and caused terrible blisters   Levothyroxine Swelling and Other (See Comments)    Lip swelling   Doxycycline Nausea Only   Thank you for allowing pharmacy to be a part of this patient's care.  Esmeralda Arthur, PharmD, BCCCP  06/15/2022 5:02 PM

## 2022-06-15 NOTE — ED Provider Triage Note (Signed)
Emergency Medicine Provider Triage Evaluation Note  [name] Fernandez , a 82 y.o. male  was evaluated in triage.  Pt complains of ulcer to left ankle. Was at [facility] and then was to be admitted, however, left due to the wait.  Denies chest pain, shortness of breath, fever.  Review of Systems  Positive:  Negative:   Physical Exam  BP 134/75   Pulse 66   Temp 97.9 F (36.6 C) (Oral)   Resp 16   SpO2 96%  Gen:   Awake, no distress   Resp:  Normal effort  MSK:   Moves extremities without difficulty  Other:    Medical Decision Making  Medically screening exam initiated at 12:42 PM.  Appropriate orders placed.  [name] was informed that the remainder of the evaluation will be completed by another provider, this initial triage assessment does not replace that evaluation, and the importance of remaining in the ED until their evaluation is complete.     Mikeisha Lemonds A, PA-C 06/15/22 2100

## 2022-06-15 NOTE — Progress Notes (Addendum)
DONNA, SILVERMAN ([id number]) [id number] Page 1 of 7 Visit Report for 06/14/2022 Arrival Information Details Patient Name: Date of Service: [place], [name] 06/14/2022 8:45 A M Medical Record Number: [id number] Patient Account Number: [id number] Date of Birth/Sex: Treating RN: 15-Jan-1941 (82 y.o. M) Primary Care Matson Welch: [name] Other Clinician: Referring Kemara Quigley: Treating Niti Leisure/Extender: Azucena Kuba in Treatment: 24 Visit Information History Since Last Visit All ordered tests and consults were completed: No Patient Arrived: Ambulatory Added or deleted any medications: No Arrival Time: 09:03 Any new allergies or adverse reactions: No Accompanied By: wife Had a fall or experienced change in No Transfer Assistance: None activities of daily living that may affect Patient Identification Verified: Yes risk of falls: Secondary Verification Process Completed: Yes Signs or symptoms of abuse/neglect since last visito No Patient Requires Transmission-Based Precautions: No Hospitalized since last visit: No Patient Has Alerts: No Implantable device outside of the clinic excluding No cellular tissue based products placed in the center since last visit: Pain Present Now: No Electronic Signature(s) Signed: 06/14/2022 2:21:53 PM By: Worthy Rancher Entered By: Worthy Rancher on 06/14/2022 09:03:32 -------------------------------------------------------------------------------- Clinic Level of Care Assessment Details Patient Name: Date of Service: [place], [name] 06/14/2022 8:45 A M Medical Record Number: [id number] Patient Account Number: [id number] Date of Birth/Sex: Treating RN: 1941/03/30 (82 y.o. Hessie Diener Primary Care Morene Cecilio: [name] Other Clinician: Referring Wh[place]any [place]: Treating Estus Krakowski/Extender: Azucena Kuba in Treatment: 34 Clinic Level of Care Assessment Items TOOL 4  Quantity Score X- 1 0 Use when only an EandM is performed on FOLLOW-UP visit ASSESSMENTS - Nursing Assessment / Reassessment X- 1 10 Reassessment of Co-morbidities (includes updates in patient status) X- 1 5 Reassessment of Adherence to Treatment Plan ASSESSMENTS - Wound and Skin A ssessment / Reassessment _0  - 0 Simple Wound Assessment / Reassessment - one wound X- 1 5 Complex Wound Assessment / Reassessment - multiple wounds X- 1 10 Dermatologic / Skin Assessment (not related to wound area) ASSESSMENTS - Focused Assessment X- 1 5 Circumferential Edema Measurements - multi extremities X- 1 10 Nutritional Assessment / Counseling / Intervention ELAN, BRAINERD ([id number]) [id number] Page 2 of 7 _1  - 0 Lower Extremity Assessment (monofilament, tuning fork, pulses) _2  - 0 Peripheral Arterial Disease Assessment (using hand held doppler) ASSESSMENTS - Ostomy and/or Continence Assessment and Care _3  - 0 Incontinence Assessment and Management _4  - 0 Ostomy Care Assessment and Management (repouching, etc.) PROCESS - Coordination of Care _5  - 0 Simple Patient / Family Education for ongoing care X- 1 20 Complex (extensive) Patient / Family Education for ongoing care X- 1 10 Staff obtains Programmer, systems, Records, T Results / Process Orders est _6  - 0 Staff telephones HHA, Nursing Homes / Clarify orders / etc _7  - 0 Routine Transfer to another Facility (non-emergent condition) X- 1 10 Routine Hospital Admission (non-emergent condition) _8  - 0 New Admissions / Biomedical engineer / Ordering NPWT Apligraf, etc. , _9  - 0 Emergency Hospital Admission (emergent condition) _10  - 0 Simple Discharge Coordination X- 1 15 Complex (extensive) Discharge Coordination PROCESS - Special Needs _11  - 0 Pediatric / Minor Patient Management _12  - 0 Isolation Patient Management _13  - 0 Hearing / Language / Visual special needs _14  - 0 Assessment of Community  assistance (transportation, D/C planning, etc.) _15  - 0 Additional assistance / Altered mentation _16  - 0 Support Surface(s) Assessment (bed, cushion, seat, etc.) INTERVENTIONS - Wound Cleansing / Measurement _17  -  0 Simple Wound Cleansing - one wound X- 1 5 Complex Wound Cleansing - multiple wounds X- 1 5 Wound Imaging (photographs - any number of wounds) _0  - 0 Wound Tracing (instead of photographs) _1  - 0 Simple Wound Measurement - one wound X- 1 5 Complex Wound Measurement - multiple wounds INTERVENTIONS - Wound Dressings _2  - 0 Small Wound Dressing one or multiple wounds X- 1 15 Medium Wound Dressing one or multiple wounds _3  - 0 Large Wound Dressing one or multiple wounds <OXBDZHGDJMEQASTM>_1<\/DQQIWLNLGXQJJHER>_7  - 0 Application of Medications - topical <EYCXKGYJEHUDJSHF>_0<\/YOVZCHYIFOYDXAJO>_8  - 0 Application of Medications - injection INTERVENTIONS - Miscellaneous _6  - 0 External ear exam _7  - 0 Specimen Collection (cultures, biopsies, blood, body fluids, etc.) _8  - 0 Specimen(s) / Culture(s) sent or taken to Lab for analysis _9  - 0 Patient Transfer (multiple staff / Civil Service fast streamer / Similar devices) _10  - 0 Simple Staple / Suture removal (25 or less) _11  - 0 Complex Staple / Suture removal (26 or more) _12  - 0 Hypo / Hyperglycemic Management (close monitor of Blood Glucose) OSHEN, WLODARCZYK ([id number]) [phone].pdf Page 3 of 7 _13  - 0 Ankle / Brachial Index (ABI) - do not check if billed separately X- 1 5 Vital Signs Has the patient been seen at the hospital within the last three years: Yes Total Score: 135 Level Of Care: New/Established - Level 4 Electronic Signature(s) Signed: 06/14/2022 5:47:40 PM By: Deon Pilling RN, BSN Entered By: Deon Pilling on 06/14/2022 09:37:47 -------------------------------------------------------------------------------- Encounter Discharge Information Details Patient Name: Date of Service: [place], [name] 06/14/2022 8:45 A M Medical Record Number: [id number] Patient Account  Number: [id number] Date of Birth/Sex: Treating RN: Feb 05, 1941 (82 y.o. Hessie Diener Primary Care Eliya Bubar: [name] Other Clinician: Referring Yakir Wenke: Treating Naseer Hearn/Extender: Azucena Kuba in Treatment: 36 Encounter Discharge Information Items Discharge Condition: Stable Ambulatory Status: Ambulatory Discharge Destination: Home Transportation: Private Auto Accompanied By: wife Schedule Follow-up Appointment: Yes Clinical Summary of Care: Electronic Signature(s) Signed: 06/14/2022 5:47:40 PM By: Deon Pilling RN, BSN Entered By: Deon Pilling on 06/14/2022 09:38:14 -------------------------------------------------------------------------------- Lower Extremity Assessment Details Patient Name: Date of Service: [place], [name] 06/14/2022 8:45 A M Medical Record Number: [id number] Patient Account Number: [id number] Date of Birth/Sex: Treating RN: 07-15-1940 (82 y.o. Hessie Diener Primary Care Sammuel Blick: [name] Other Clinician: Referring Aima Mcwhirt: Treating Satish Hammers/Extender: Azucena Kuba in Treatment: 34 Edema Assessment Assessed: [Left: Yes] Patrice Paradise: No] Edema: [Left: Ye] [Right: s] Calf Left: Right: Point of Measurement: 32 cm From Medial Instep 38 cm Ankle Left: Right: Point of Measurement: 10 cm From Medial Instep 25 cm Vascular Assessment EDMAN, LIPSEY ([id number]) [Right:[id number] Page 4 of 7] Pulses: Dorsalis Pedis Palpable: [Left:Yes] Electronic Signature(s) Signed: 06/14/2022 5:47:40 PM By: Deon Pilling RN, BSN Entered By: Deon Pilling on 06/14/2022 09:17:06 -------------------------------------------------------------------------------- [place] Details Patient Name: Date of Service: [place], [name] 06/14/2022 8:45 A M Medical Record Number: [id number] Patient Account Number: [id number] Date of Birth/Sex: Treating RN: 21-May-1941  (82 y.o. Hessie Diener Primary Care Iran Rowe: [name] Other Clinician: Referring Marise Knapper: Treating Dagny Fiorentino/Extender: Azucena Kuba in Treatment: 42 Active Inactive Electronic Signature(s) Signed: 07/03/2022 5:03:34 PM By: Deon Pilling RN, BSN Previous Signature: 06/14/2022 5:47:40 PM Version By: Deon Pilling RN, BSN Entered By: Deon Pilling on 07/03/2022 17:03:33 -------------------------------------------------------------------------------- Pain Assessment Details Patient Name: Date of Service: [name], [name] 06/14/2022 8:45  A M Medical Record Number: [id number] Patient Account Number: [id number] Date of Birth/Sex: Treating RN: Feb 08, 1941 (82 y.o. M) Primary Care Kamyra Schroeck: [name] Other Clinician: Referring Wynetta Seith: Treating Chisom Aust/Extender: Azucena Kuba in Treatment: 34 Active Problems Location of Pain Severity and Description of Pain Patient Has Paino Yes Site Locations Rate the pain. Current Pain Level: 7 Worst Pain Level: 10 Least Pain Level: 0 Tolerable Pain Level: 2 CHRISTAN, DEFRANCO ([id number]) [phone].pdf Page 5 of 7 Character of Pain Describe the Pain: Burning, Sharp, Shooting, Stabbing Pain Management and Medication Current Pain Management: Electronic Signature(s) Signed: 06/14/2022 5:47:40 PM By: Deon Pilling RN, BSN Entered By: Deon Pilling on 06/14/2022 09:28:34 -------------------------------------------------------------------------------- Patient/Caregiver Education Details Patient Name: Date of Service: [place] [name] 1/3/2024andnbsp8:45 A M Medical Record Number: [id number] Patient Account Number: [id number] Date of Birth/Gender: Treating RN: Mar 04, 1941 (82 y.o. Hessie Diener Primary Care Physician: [name] Other Clinician: Referring Physician: Treating Physician/Extender: Azucena Kuba in  Treatment: 57 Education Assessment Education Provided To: Patient Education Topics Provided Wound/Skin Impairment: Handouts: Caring for Your Ulcer Methods: Explain/Verbal Responses: Reinforcements needed Electronic Signature(s) Signed: 06/14/2022 5:47:40 PM By: Deon Pilling RN, BSN Entered By: Deon Pilling on 06/14/2022 09:29:20 -------------------------------------------------------------------------------- Wound Assessment Details Patient Name: Date of Service: [name], [name] 06/14/2022 8:45 A M Medical Record Number: [id number] Patient Account Number: [id number] Date of Birth/Sex: Treating RN: 01-13-1941 (82 y.o. M) Primary Care Meer Reindl: [name] Other Clinician: Referring Heide Brossart: Treating Jariel Drost/Extender: Azucena Kuba in Treatment: 34 Wound Status Wound Number: 6 Primary Vasculitis Etiology: Wound Location: Left, Lateral Foot Wound Open Wounding Event: Gradually Appeared Status: Date Acquired: 07/25/2021 Comorbid Anemia, Chronic Obstructive Pulmonary Disease (COPD), Weeks Of Treatment: 34 History: Hypertension, Peripheral Venous Disease, Type II Diabetes, Clustered Wound: No Osteoarthritis, Neuropathy Photos JEOVANNI, HEURING ([id number]) [phone].pdf Page 6 of 7 Wound Measurements Length: (cm) 6.4 Width: (cm) 7 Depth: (cm) 0.2 Area: (cm) 35.186 Volume: (cm) 7.037 % Reduction in Area: -80.6% % Reduction in Volume: -80.6% Epithelialization: Small (1-33%) Tunneling: No Undermining: No Wound Description Classification: Full Thickness Without Exposed Suppor Wound Margin: Distinct, outline attached Exudate Amount: Large Exudate Type: Serosanguineous Exudate Color: red, brown t Structures Foul Odor After Cleansing: No Slough/Fibrino Yes Wound Bed Granulation Amount: Large (67-100%) Exposed Structure Granulation Quality: Red, Pink Fascia Exposed: No Necrotic Amount: Small (1-33%) Fat Layer  (Subcutaneous Tissue) Exposed: Yes Necrotic Quality: Eschar, Adherent Slough Tendon Exposed: No Muscle Exposed: No Joint Exposed: No Bone Exposed: No Periwound Skin Texture Texture Color No Abnormalities Noted: Yes No Abnormalities Noted: No Atrophie Blanche: No Moisture Cyanosis: No No Abnormalities Noted: No Ecchymosis: No Dry / Scaly: No Erythema: No Maceration: Yes Hemosiderin Staining: No Mottled: No Pallor: No Rubor: No Temperature / Pain Temperature: No Abnormality Electronic Signature(s) Signed: 06/14/2022 5:47:40 PM By: Deon Pilling RN, BSN Entered By: Deon Pilling on 06/14/2022 09:17:23 -------------------------------------------------------------------------------- Vitals Details Patient Name: Date of Service: [name], [name] 06/14/2022 8:45 A M Medical Record Number: [id number] Patient Account Number: [id number] Date of Birth/Sex: Treating RN: [birth year] (82 y.o. M) Primary Care Yandel Zeiner: [name] Other Clinician: Referring Koby Hartfield: Treating Jaziah Goeller/Extender: Azucena Kuba in Treatment: [address] MERIK, MIGNANO ([id number]) [id number] Page 7 of 7 Time Taken: 09:03 Temperature (F): 98.1 Height (in): 66 Pulse (bpm): 62 Weight (lbs): 184 Respiratory Rate (breaths/min): 20 Body Mass Index (BMI): 29.7 Blood Pressure (  mmHg): 138/73 Reference Range: 80 - 120 mg / dl Electronic Signature(s) Signed: 06/14/2022 2:21:53 PM By: Worthy Rancher Entered By: Worthy Rancher on 06/14/2022 09:03:56

## 2022-06-16 ENCOUNTER — Encounter (HOSPITAL_COMMUNITY)
Admission: EM | Disposition: A | Discharge status: Home Health | Payer: Self-pay | Source: Home / Self Care | Attending: Internal Medicine

## 2022-06-16 ENCOUNTER — Observation Stay (HOSPITAL_COMMUNITY): Payer: Medicare HMO | Admitting: Certified Registered Nurse Anesthetist

## 2022-06-16 ENCOUNTER — Encounter (HOSPITAL_COMMUNITY): Payer: Self-pay | Admitting: Internal Medicine

## 2022-06-16 ENCOUNTER — Other Ambulatory Visit: Payer: Self-pay

## 2022-06-16 ENCOUNTER — Observation Stay (HOSPITAL_BASED_OUTPATIENT_CLINIC_OR_DEPARTMENT_OTHER): Payer: Medicare HMO | Admitting: Certified Registered Nurse Anesthetist

## 2022-06-16 DIAGNOSIS — M86262 Subacute osteomyelitis, left tibia and fibula: Secondary | ICD-10-CM

## 2022-06-16 DIAGNOSIS — L97309 Non-pressure chronic ulcer of unspecified ankle with unspecified severity: Secondary | ICD-10-CM

## 2022-06-16 DIAGNOSIS — E11622 Type 2 diabetes mellitus with other skin ulcer: Secondary | ICD-10-CM

## 2022-06-16 DIAGNOSIS — S91002A Unspecified open wound, left ankle, initial encounter: Secondary | ICD-10-CM | POA: Diagnosis not present

## 2022-06-16 DIAGNOSIS — A498 Other bacterial infections of unspecified site: Secondary | ICD-10-CM | POA: Diagnosis not present

## 2022-06-16 DIAGNOSIS — E1169 Type 2 diabetes mellitus with other specified complication: Secondary | ICD-10-CM

## 2022-06-16 DIAGNOSIS — Z87891 Personal history of nicotine dependence: Secondary | ICD-10-CM

## 2022-06-16 DIAGNOSIS — M869 Osteomyelitis, unspecified: Secondary | ICD-10-CM | POA: Diagnosis not present

## 2022-06-16 DIAGNOSIS — D649 Anemia, unspecified: Secondary | ICD-10-CM

## 2022-06-16 DIAGNOSIS — J449 Chronic obstructive pulmonary disease, unspecified: Secondary | ICD-10-CM

## 2022-06-16 DIAGNOSIS — I872 Venous insufficiency (chronic) (peripheral): Secondary | ICD-10-CM

## 2022-06-16 DIAGNOSIS — M86162 Other acute osteomyelitis, left tibia and fibula: Secondary | ICD-10-CM

## 2022-06-16 DIAGNOSIS — T148XXA Other injury of unspecified body region, initial encounter: Secondary | ICD-10-CM | POA: Diagnosis not present

## 2022-06-16 DIAGNOSIS — I10 Essential (primary) hypertension: Secondary | ICD-10-CM | POA: Diagnosis not present

## 2022-06-16 DIAGNOSIS — M659 Synovitis and tenosynovitis, unspecified: Secondary | ICD-10-CM | POA: Diagnosis not present

## 2022-06-16 DIAGNOSIS — L089 Local infection of the skin and subcutaneous tissue, unspecified: Secondary | ICD-10-CM | POA: Diagnosis not present

## 2022-06-16 HISTORY — PX: I & D EXTREMITY: SHX5045

## 2022-06-16 LAB — CBC
HCT: 33.6 % — ABNORMAL LOW (ref 39.0–52.0)
Hemoglobin: 11.5 g/dL — ABNORMAL LOW (ref 13.0–17.0)
MCH: 32.5 pg (ref 26.0–34.0)
MCHC: 34.2 g/dL (ref 30.0–36.0)
MCV: 94.9 fL (ref 80.0–100.0)
Platelets: 412 10*3/uL — ABNORMAL HIGH (ref 150–400)
RBC: 3.54 MIL/uL — ABNORMAL LOW (ref 4.22–5.81)
RDW: 15.1 % (ref 11.5–15.5)
WBC: 9.2 10*3/uL (ref 4.0–10.5)
nRBC: 0 % (ref 0.0–0.2)

## 2022-06-16 LAB — COMPREHENSIVE METABOLIC PANEL
ALT: 18 U/L (ref 0–44)
AST: 21 U/L (ref 15–41)
Albumin: 2.9 g/dL — ABNORMAL LOW (ref 3.5–5.0)
Alkaline Phosphatase: 69 U/L (ref 38–126)
Anion gap: 5 (ref 5–15)
BUN: 16 mg/dL (ref 8–23)
CO2: 29 mmol/L (ref 22–32)
Calcium: 8.7 mg/dL — ABNORMAL LOW (ref 8.9–10.3)
Chloride: 106 mmol/L (ref 98–111)
Creatinine, Ser: 0.94 mg/dL (ref 0.61–1.24)
GFR, Estimated: >60 mL/min (ref >60)
Glucose, Bld: 111 mg/dL — ABNORMAL HIGH (ref 70–99)
Potassium: 4.3 mmol/L (ref 3.5–5.1)
Sodium: 140 mmol/L (ref 135–145)
Total Bilirubin: 0.9 mg/dL (ref 0.3–1.2)
Total Protein: 6.1 g/dL — ABNORMAL LOW (ref 6.5–8.1)

## 2022-06-16 LAB — GLUCOSE, CAPILLARY: Glucose-Capillary: 83 mg/dL (ref 70–99)

## 2022-06-16 SURGERY — IRRIGATION AND DEBRIDEMENT EXTREMITY
Anesthesia: General | Site: Ankle | Laterality: Left

## 2022-06-16 MED ORDER — ORAL CARE MOUTH RINSE: 15.0000 mL | Freq: Once | OROMUCOSAL | Status: AC

## 2022-06-16 MED ORDER — FENTANYL CITRATE (PF) 100 MCG/2ML IJ SOLN
25.0000 ug | INTRAMUSCULAR | Status: DC | PRN
  Administered 2022-06-16: 50 ug via INTRAVENOUS

## 2022-06-16 MED ORDER — EPHEDRINE SULFATE-NACL 50-0.9 MG/10ML-% IV SOSY
PREFILLED_SYRINGE | INTRAVENOUS | Status: DC | PRN
  Administered 2022-06-16: 5 mg via INTRAVENOUS

## 2022-06-16 MED ORDER — CEFAZOLIN SODIUM-DEXTROSE 2-4 GM/100ML-% IV SOLN
2.0000 g | INTRAVENOUS | Status: AC
  Administered 2022-06-16: 2 g via INTRAVENOUS

## 2022-06-16 MED ORDER — CHLORHEXIDINE GLUCONATE 4 % EX LIQD: 60.0000 mL | Freq: Once | CUTANEOUS | Status: DC

## 2022-06-16 MED ORDER — ONDANSETRON HCL 4 MG/2ML IJ SOLN: 4.0000 mg | Freq: Four times a day (QID) | INTRAMUSCULAR | Status: DC | PRN

## 2022-06-16 MED ORDER — 0.9 % SODIUM CHLORIDE (POUR BTL) OPTIME
TOPICAL | Status: DC | PRN
  Administered 2022-06-16: 1000 mL

## 2022-06-16 MED ORDER — FENTANYL CITRATE (PF) 250 MCG/5ML IJ SOLN
INTRAMUSCULAR | Status: AC
  Filled 2022-06-16: qty 5

## 2022-06-16 MED ORDER — SODIUM CHLORIDE 0.9 % IV SOLN: INTRAVENOUS | Status: DC

## 2022-06-16 MED ORDER — METHOCARBAMOL 500 MG PO TABS
500.0000 mg | ORAL_TABLET | Freq: Four times a day (QID) | ORAL | Status: DC | PRN
  Administered 2022-06-17: 500 mg via ORAL
  Filled 2022-06-16: qty 1

## 2022-06-16 MED ORDER — POVIDONE-IODINE 10 % EX SWAB
2.0000 | Freq: Once | CUTANEOUS | Status: AC
  Administered 2022-06-16: 2 via TOPICAL

## 2022-06-16 MED ORDER — FENTANYL CITRATE (PF) 100 MCG/2ML IJ SOLN
INTRAMUSCULAR | Status: AC
  Administered 2022-06-16: 50 ug via INTRAVENOUS
  Filled 2022-06-16: qty 2

## 2022-06-16 MED ORDER — OXYCODONE HCL 5 MG PO TABS
5.0000 mg | ORAL_TABLET | ORAL | Status: DC | PRN
  Administered 2022-06-16: 10 mg via ORAL
  Filled 2022-06-16: qty 2

## 2022-06-16 MED ORDER — METOCLOPRAMIDE HCL 5 MG/ML IJ SOLN: 5.0000 mg | Freq: Three times a day (TID) | INTRAMUSCULAR | Status: DC | PRN

## 2022-06-16 MED ORDER — LACTATED RINGERS IV SOLN: INTRAVENOUS | Status: DC

## 2022-06-16 MED ORDER — BISACODYL 10 MG RE SUPP: 10.0000 mg | Freq: Every day | RECTAL | Status: DC | PRN

## 2022-06-16 MED ORDER — METHOCARBAMOL 1000 MG/10ML IJ SOLN: 500.0000 mg | Freq: Four times a day (QID) | INTRAVENOUS | Status: DC | PRN

## 2022-06-16 MED ORDER — OXYCODONE HCL 5 MG PO TABS
10.0000 mg | ORAL_TABLET | ORAL | Status: DC | PRN
  Administered 2022-06-16: 10 mg via ORAL
  Administered 2022-06-17 – 2022-06-19 (×9): 15 mg via ORAL
  Filled 2022-06-16 (×2): qty 3
  Filled 2022-06-16: qty 2
  Filled 2022-06-16 (×7): qty 3

## 2022-06-16 MED ORDER — MAGNESIUM CITRATE PO SOLN: 1.0000 | Freq: Once | ORAL | Status: DC | PRN

## 2022-06-16 MED ORDER — CHLORHEXIDINE GLUCONATE 0.12 % MT SOLN: 15.0000 mL | Freq: Once | OROMUCOSAL | Status: AC

## 2022-06-16 MED ORDER — CEFAZOLIN SODIUM-DEXTROSE 2-4 GM/100ML-% IV SOLN
INTRAVENOUS | Status: AC
  Filled 2022-06-16: qty 100

## 2022-06-16 MED ORDER — PHENYLEPHRINE 80 MCG/ML (10ML) SYRINGE FOR IV PUSH (FOR BLOOD PRESSURE SUPPORT)
PREFILLED_SYRINGE | INTRAVENOUS | Status: AC
  Filled 2022-06-16: qty 10

## 2022-06-16 MED ORDER — ENOXAPARIN SODIUM 40 MG/0.4ML IJ SOSY
40.0000 mg | PREFILLED_SYRINGE | INTRAMUSCULAR | Status: DC
  Administered 2022-06-17 – 2022-06-21 (×5): 40 mg via SUBCUTANEOUS
  Filled 2022-06-16 (×5): qty 0.4

## 2022-06-16 MED ORDER — TRAMADOL HCL 50 MG PO TABS
50.0000 mg | ORAL_TABLET | Freq: Four times a day (QID) | ORAL | Status: DC | PRN
  Administered 2022-06-16 – 2022-06-20 (×5): 50 mg via ORAL
  Filled 2022-06-16 (×5): qty 1

## 2022-06-16 MED ORDER — LIDOCAINE 2% (20 MG/ML) 5 ML SYRINGE
INTRAMUSCULAR | Status: DC | PRN
  Administered 2022-06-16: 60 mg via INTRAVENOUS

## 2022-06-16 MED ORDER — METOCLOPRAMIDE HCL 5 MG PO TABS: 5.0000 mg | ORAL_TABLET | Freq: Three times a day (TID) | ORAL | Status: DC | PRN

## 2022-06-16 MED ORDER — DEXAMETHASONE SODIUM PHOSPHATE 10 MG/ML IJ SOLN
INTRAMUSCULAR | Status: DC | PRN
  Administered 2022-06-16: 5 mg via INTRAVENOUS

## 2022-06-16 MED ORDER — FENTANYL CITRATE (PF) 250 MCG/5ML IJ SOLN
INTRAMUSCULAR | Status: DC | PRN
  Administered 2022-06-16: 50 ug via INTRAVENOUS

## 2022-06-16 MED ORDER — POLYETHYLENE GLYCOL 3350 17 G PO PACK: 17.0000 g | PACK | Freq: Every day | ORAL | Status: DC | PRN

## 2022-06-16 MED ORDER — PROPOFOL 10 MG/ML IV BOLUS
INTRAVENOUS | Status: DC | PRN
  Administered 2022-06-16: 150 mg via INTRAVENOUS

## 2022-06-16 MED ORDER — CHLORHEXIDINE GLUCONATE 0.12 % MT SOLN
OROMUCOSAL | Status: AC
  Administered 2022-06-16: 15 mL via OROMUCOSAL
  Filled 2022-06-16: qty 15

## 2022-06-16 MED ORDER — DOCUSATE SODIUM 100 MG PO CAPS
100.0000 mg | ORAL_CAPSULE | Freq: Two times a day (BID) | ORAL | Status: DC
  Administered 2022-06-16 – 2022-06-17 (×3): 100 mg via ORAL
  Filled 2022-06-16 (×3): qty 1

## 2022-06-16 MED ORDER — ACETAMINOPHEN 325 MG PO TABS: 325.0000 mg | ORAL_TABLET | Freq: Four times a day (QID) | ORAL | Status: DC | PRN

## 2022-06-16 MED ORDER — ONDANSETRON HCL 4 MG/2ML IJ SOLN
INTRAMUSCULAR | Status: DC | PRN
  Administered 2022-06-16: 4 mg via INTRAVENOUS

## 2022-06-16 MED ORDER — EPHEDRINE 5 MG/ML INJ
INTRAVENOUS | Status: AC
  Filled 2022-06-16: qty 10

## 2022-06-16 MED ORDER — ONDANSETRON HCL 4 MG PO TABS: 4.0000 mg | ORAL_TABLET | Freq: Four times a day (QID) | ORAL | Status: DC | PRN

## 2022-06-16 MED ORDER — HYDROMORPHONE HCL 1 MG/ML IJ SOLN
0.5000 mg | INTRAMUSCULAR | Status: DC | PRN
  Administered 2022-06-16 – 2022-06-17 (×3): 1 mg via INTRAVENOUS
  Filled 2022-06-16 (×3): qty 1

## 2022-06-16 SURGICAL SUPPLY — 37 items
BAG COUNTER SPONGE SURGICOUNT (BAG) IMPLANT
BAG SPNG CNTER NS LX DISP (BAG)
BLADE SURG 21 STRL SS (BLADE) ×1 IMPLANT
BNDG COHESIVE 1X5 TAN STRL LF (GAUZE/BANDAGES/DRESSINGS) IMPLANT
BNDG COHESIVE 6X5 TAN STRL LF (GAUZE/BANDAGES/DRESSINGS) IMPLANT
BNDG GAUZE DERMACEA FLUFF 4 (GAUZE/BANDAGES/DRESSINGS) ×2 IMPLANT
BNDG GZE DERMACEA 4 6PLY (GAUZE/BANDAGES/DRESSINGS) ×2
COVER SURGICAL LIGHT HANDLE (MISCELLANEOUS) ×2 IMPLANT
DRAPE U-SHAPE 47X51 STRL (DRAPES) ×1 IMPLANT
DRESSING VERAFLO CLEANS CC MED (GAUZE/BANDAGES/DRESSINGS) IMPLANT
DRSG ADAPTIC 3X8 NADH LF (GAUZE/BANDAGES/DRESSINGS) ×1 IMPLANT
DRSG VERAFLO CLEANSE CC MED (GAUZE/BANDAGES/DRESSINGS) ×1
DURAPREP 26ML APPLICATOR (WOUND CARE) ×1 IMPLANT
ELECT REM PT RETURN 9FT ADLT (ELECTROSURGICAL)
ELECTRODE REM PT RTRN 9FT ADLT (ELECTROSURGICAL) IMPLANT
GAUZE SPONGE 4X4 12PLY STRL (GAUZE/BANDAGES/DRESSINGS) ×1 IMPLANT
GLOVE BIOGEL PI IND STRL 9 (GLOVE) ×1 IMPLANT
GLOVE SURG ORTHO 9.0 STRL STRW (GLOVE) ×1 IMPLANT
GOWN STRL REUS W/ TWL XL LVL3 (GOWN DISPOSABLE) ×2 IMPLANT
GOWN STRL REUS W/TWL XL LVL3 (GOWN DISPOSABLE) ×2
GRAFT SKIN WND MICRO 38 (Tissue) IMPLANT
GRAFT SKIN WND OMEGA3 SB 7X10 (Tissue) IMPLANT
HANDPIECE INTERPULSE COAX TIP (DISPOSABLE)
KIT BASIN OR (CUSTOM PROCEDURE TRAY) ×1 IMPLANT
KIT TURNOVER KIT B (KITS) ×1 IMPLANT
MANIFOLD NEPTUNE II (INSTRUMENTS) ×1 IMPLANT
NS IRRIG 1000ML POUR BTL (IV SOLUTION) ×1 IMPLANT
PACK ORTHO EXTREMITY (CUSTOM PROCEDURE TRAY) ×1 IMPLANT
PAD ARMBOARD 7.5X6 YLW CONV (MISCELLANEOUS) ×2 IMPLANT
SET HNDPC FAN SPRY TIP SCT (DISPOSABLE) IMPLANT
STOCKINETTE IMPERVIOUS 9X36 MD (GAUZE/BANDAGES/DRESSINGS) IMPLANT
SUT ETHILON 2 0 PSLX (SUTURE) ×1 IMPLANT
SWAB COLLECTION DEVICE MRSA (MISCELLANEOUS) ×1 IMPLANT
SWAB CULTURE ESWAB REG 1ML (MISCELLANEOUS) IMPLANT
TOWEL GREEN STERILE (TOWEL DISPOSABLE) ×1 IMPLANT
TUBE CONNECTING 12X1/4 (SUCTIONS) ×1 IMPLANT
YANKAUER SUCT BULB TIP NO VENT (SUCTIONS) ×1 IMPLANT

## 2022-06-16 NOTE — Op Note (Signed)
06/16/2022  2:36 PM  PATIENT:  [name] Fernandez    PRE-OPERATIVE DIAGNOSIS:  Wound left Ankle  POST-OPERATIVE DIAGNOSIS: Osteomyeliti left distal fibula.  PROCEDURE:  IRRIGATION AND DEBRIDEMENT ANKLE Excision skin and soft tissue muscle fascia and partial excision of left distal fibula. Application of Kerecis tissue graft: 38 cm micro graft and 7 x 10 cm sheet. Application of cleanse choice wound VAC sponge x 1. Tissue sent for cultures and bone sent for culture separately.  SURGEON:  Newt Minion, MD  PHYSICIAN ASSISTANT:None ANESTHESIA:   General  PREOPERATIVE INDICATIONS:  [name] is a  82 y.o. male with a diagnosis of Wound left Ankle who failed conservative measures and elected for surgical management.    The risks benefits and alternatives were discussed with the patient preoperatively including but not limited to the risks of infection, bleeding, nerve injury, cardiopulmonary complications, the need for revision surgery, among others, and the patient was willing to proceed.  OPERATIVE IMPLANTS: Kerecis micro graft 38 cm and Kerecis sheet 7 x 10 cm  @ENCIMAGES @  OPERATIVE FINDINGS: Patient had dry necrotic changes of the distal fibula.  This was resected and sent for cultures.  Soft tissue sent for cultures separately from the bone culture.  OPERATIVE PROCEDURE: Patient was brought the operating room and underwent a general anesthetic.  After adequate levels anesthesia were obtained patient's left lower extremity was prepped using DuraPrep draped into a sterile field a timeout was called.  A 21 blade knife and rondure was used to excise the skin and soft tissue from the left ankle wound.  This left a wound that was 10 x 9 cm.  There was ischemic changes to the lateral distal fibula and an osteotome was used to resect the lateral wall of the fibula back to healthy viable subchondral bone.  This bone was sent for cultures separately from the soft tissue.  The wound was  irrigated with normal saline electrocautery was used hemostasis.  The wound bed was filled with 38 cm Kerecis micro powder this was covered with a Kerecis sheet.  This was secured with a cleanse choice sponge and derma tack covered with Coban.  This had a good suction fit patient was extubated taken the PACU in stable condition.   DISCHARGE PLANNING:  Antibiotic duration: Patient will need anticipated 6 weeks of IV antibiotics  Weightbearing: Weightbearing as tolerated with a cam boot  Pain medication: Opioid pathway  Dressing care/ Wound VAC: Continue wound VAC for 1 week  Ambulatory devices: Walker as needed  Discharge to: Anticipate discharge to home.  Follow-up: In the office 1 week post operative.

## 2022-06-16 NOTE — Consult Note (Signed)
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 male who presents with chronic lateral left ankle wound.  Patient states he has been going to the wound center for 8 months without resolution.  Past Medical History:  Diagnosis Date   Anemia    Arthritis    Celiac disease    diagnoses 06/24/12   Esophageal stricture    H/O hiatal hernia    Hiatal hernia    Hypertension    Hypothyroidism    Prediabetes    Shortness of breath    from oxycodone at times   Vitamin B12 deficiency    Vitamin D deficiency    Past Surgical History:  Procedure Laterality Date   BACK SURGERY     3 diff surgeries for vertebrea broken   EYE SURGERY     bilateral cataract surgery   HERNIA REPAIR  1950's   bilateral groin as child   HIATAL HERNIA REPAIR     JOINT REPLACEMENT  04/2010   right knee replacement   TONSILLECTOMY     as child   TOTAL KNEE REVISION  11/20/2011   Procedure: TOTAL KNEE REVISION;  Surgeon: Mauri Pole, MD;  Location: [facility];  Service: Orthopedics;  Laterality: Right;  Right Total Knee Revision   Social History   Socioeconomic History   Marital status: Married    Spouse name: Not on file   Number of children: 2   Years of education: Not on file   Highest education level: Not on file  Occupational History   Occupation: Retired    Fish farm manager: [place]  Tobacco Use   Smoking status: Former    Packs/day: 1.00    Years: 20.00    Total pack years: 20.00    Types: Cigarettes    Quit date: 06/27/1980    Years since quitting: 41.9    Passive exposure: Past   Smokeless tobacco: Never  Vaping Use   Vaping Use: Never used  Substance and Sexual Activity   Alcohol use: No   Drug use: No   Sexual activity: Yes  Other Topics Concern   Not on file  Social History Narrative   Not on file   Social Determinants of Health   Financial  Resource Strain: Not on file  Food Insecurity: No Food Insecurity (06/15/2022)   Hunger Vital Sign    Worried About Running Out of Food in the Last Year: Never true    Ran Out of Food in the Last Year: Never true  Transportation Needs: No Transportation Needs (06/15/2022)   PRAPARE - Hydrologist (Medical): No    Lack of Transportation (Non-Medical): No  Physical Activity: Not on file  Stress: Not on file  Social Connections: Not on file   Family History  Problem Relation Age of Onset   Esophageal cancer Father    - negative except otherwise stated in the family history section Allergies  Allergen Reactions   Feraheme [Ferumoxytol] Swelling and Other (See Comments)    Swelling of lips and tongue, could not talk 30 minutes after the infusion.   Latex Other (See Comments)    Latex butterfly bandages tore off the skin and caused terrible blisters   Wound Dressing Adhesive Other (See Comments)    Latex butterfly bandages tore off the skin and caused terrible blisters   Levothyroxine Swelling and Other (  See Comments)    Lip swelling   Doxycycline Nausea Only   Prior to Admission medications   Medication Sig Start Date End Date Taking? Authorizing Provider  acetaminophen (TYLENOL) 500 MG tablet Take 500-1,000 mg by mouth every 6 (six) hours as needed for mild pain or headache.    [provider]  Ascorbic Acid (VITAMIN C) 1000 MG tablet Take 1,000 mg by mouth daily. Patient not taking: Reported on 06/15/2022    [provider]  Cholecalciferol (VITAMIN D3) 125 MCG (5000 UT) CAPS Take 5,000 Units by mouth 2 (two) times daily. Patient not taking: Reported on 06/15/2022    [provider]  clotrimazole-betamethasone (LOTRISONE) cream APPLY TO AFFECTED AREA TWICE A DAY Patient taking differently: Apply 1 Application topically See admin instructions. Apply to affected area 2 times a day 10/24/21   Raynelle Dick, NP  Ferrous Sulfate (IRON)  325 (65 Fe) MG TABS Take by mouth. Patient not taking: Reported on 06/15/2022    [provider]  fluticasone (FLONASE) 50 MCG/ACT nasal spray Place 1-2 sprays into both nostrils daily as needed for allergies or rhinitis.    [provider]  gabapentin (NEURONTIN) 600 MG tablet Take 1/2 to 1 tablet 2 to 3 x /Daily as needed for Chronic Pain Patient taking differently: Take 300-600 mg by mouth See admin instructions. Take 300 mg by mouth at bedtime and an additional 600 mg up to twice a day as needed for pain (is allowed to take up to a sum total of 1,800 mg/day) 10/24/21   Lucky Cowboy, MD  gentamicin cream (GARAMYCIN) 0.1 % Apply 1 Application topically 2 (two) times daily as needed (for minor infections).    [provider]  hydrOXYzine (ATARAX) 25 MG tablet Take  1 tablet  3 x /day  as needed  for Anxiety or Sleep Patient taking differently: Take 25 mg by mouth at bedtime. 04/04/22   Lucky Cowboy, MD  NON FORMULARY Take 1 capsule by mouth See admin instructions. Thyroid Care Plus capsules- Take 1 capsule by mouth once a day    [provider]  omeprazole (PRILOSEC OTC) 20 MG tablet Take 20 mg by mouth daily before breakfast.    [provider]  Red Yeast Rice 600 MG CAPS PRN Patient not taking: Reported on 06/14/2022 02/26/21   Lucky Cowboy, MD  senna (SENOKOT) 8.6 MG TABS tablet Take 1-2 tablets by mouth at bedtime.    [provider]  torsemide (DEMADEX) 20 MG tablet Take  1 tablet  Daily for Fluid Retention / Leg Swelling Patient taking differently: Take 20 mg by mouth in the morning. 01/20/22   Lucky Cowboy, MD  triamcinolone cream (KENALOG) 0.5 % Apply 1 application topically 2 (two) times daily. Patient taking differently: Apply 1 application  topically See admin instructions. Apply to left foot and bilateral legs every other day 08/11/20   Lucky Cowboy, MD   MR ANKLE LEFT WO CONTRAST  Result Date: 06/14/2022 CLINICAL DATA:   Nonhealing wound along the lateral left ankle, discoloration of the skin. EXAM: MRI OF THE LEFT ANKLE WITHOUT CONTRAST TECHNIQUE: Multiplanar, multisequence MR imaging of the ankle was performed. No intravenous contrast was administered. COMPARISON:  Radiographs 01/14/2013 FINDINGS: TENDONS Peroneal: Common peroneus tendon sheath tenosynovitis. Posteromedial: Mild tibialis posterior tenosynovitis. Anterior: Unremarkable Achilles: Unremarkable Plantar Fascia: Mild thickening of the medial and lateral bands of the plantar fascia proximally. Plantar calcaneal spur noted. LIGAMENTS Lateral: Thin/attenuated ATFL probably from remote injury. The ATFL does  not currently appear overtly discontinuous. No surrounding synovitis. Medial: Unremarkable CARTILAGE Ankle Joint: Unremarkable Subtalar Joints/Sinus Tarsi: Unremarkable Bones: Subtle endosteal edema along the lateral malleolus underlying the region of cutaneous irregularity. Other: Cutaneous thinning and irregularity superficial to the lateral malleolus and tracking along the anterolateral ankle region compatible with ulceration. No drainable abscess observed. IMPRESSION: 1. Cutaneous thinning and irregularity superficial to the lateral malleolus and tracking along the anterolateral ankle region compatible with ulceration. No drainable abscess observed. 2. Subtle endosteal edema along the lateral malleolus underlying the region of cutaneous irregularity, probably reactive to local inflammation and not of a magnitude to be specific for osteomyelitis at this time. 3. Common peroneus tendon sheath tenosynovitis. Mild tibialis posterior tenosynovitis. 4. Mild thickening of the medial and lateral bands of the plantar fascia proximally, with plantar calcaneal spur. 5. Thin/attenuated ATFL probably from remote injury. The ATFL does not currently appear overtly discontinuous. Electronically Signed   By: Gaylyn Rong M.D.   On: 06/14/2022 14:32   - pertinent xrays, CT, MRI  studies were reviewed and independently interpreted  Positive ROS: All other systems have been reviewed and were otherwise negative with the exception of those mentioned in the HPI and as above.  Physical Exam: General: Alert, no acute distress Psychiatric: Patient is competent for consent with normal mood and affect Lymphatic: No axillary or cervical lymphadenopathy Cardiovascular: No pedal edema Respiratory: No cyanosis, no use of accessory musculature GI: No organomegaly, abdomen is soft and non-tender    Images:  @ENCIMAGES @  Labs:  Lab Results  Component Value Date   HGBA1C 6.4 (H) 04/04/2022   HGBA1C 5.9 (H) 09/14/2021   HGBA1C 5.8 (H) 03/16/2021   ESRSEDRATE 32 (H) 06/15/2022   ESRSEDRATE 40 (H) 06/14/2022   CRP 0.8 06/15/2022   CRP <0.5 06/14/2022   LABURIC 7.8 05/20/2014   LABURIC 6.8 05/20/2013   REPTSTATUS 03/20/2022 FINAL 03/15/2022   GRAMSTAIN NO WBC SEEN RARE GRAM NEGATIVE RODS  03/15/2022   CULT  03/15/2022    ABUNDANT PSEUDOMONAS AERUGINOSA FEW ENTEROCOCCUS FAECALIS NO ANAEROBES ISOLATED Performed at [facility], [address], [place], [place]    LABORGA PSEUDOMONAS AERUGINOSA 03/15/2022   LABORGA ENTEROCOCCUS FAECALIS 03/15/2022    Lab Results  Component Value Date   ALBUMIN 2.9 (L) 06/16/2022   ALBUMIN 3.8 06/14/2022   ALBUMIN 4.0 09/25/2016   PREALBUMIN 18 06/14/2022   LABURIC 7.8 05/20/2014   LABURIC 6.8 05/20/2013        Latest Ref Rng & Units 06/16/2022    4:40 AM 06/15/2022    1:05 PM 06/14/2022    1:15 PM  CBC EXTENDED  WBC 4.0 - 10.5 K/uL 9.2  8.6  9.4   RBC 4.22 - 5.81 MIL/uL 3.54  3.97  4.20   Hemoglobin 13.0 - 17.0 g/dL 08/13/2022  09.3  81.8   HCT 39.0 - 52.0 % 33.6  37.9  39.8   Platelets 150 - 400 K/uL 412  429  459   NEUT# 1.7 - 7.7 K/uL  5.7  6.6   Lymph# 0.7 - 4.0 K/uL  2.0  1.8     Neurologic: Patient does not have protective sensation bilateral lower extremities.   MUSCULOSKELETAL:   Skin:  Examination patient has a necrotic wound over the lateral left ankle approximately 5 cm in diameter.  There is brawny edema consistent with chronic venous insufficiency.  There is black eschar proximally.  Patient has a strong palpable dorsalis pedis pulse.  White cell count  9.2 hemoglobin 11.5.  Hemoglobin A1c 6.4.  Review of the MRI scan shows edema in the lateral distal fibula consistent with possible early osteomyelitis.  There is no destructive bony changes radiographically.  Patient has fluid in the peroneal tendons.  No abscess.  Assessment: Assessment: Chronic lateral left ankle wound.  Plan: Will plan for surgical debridement today obtain tissue cultures and determine if the fibula is involved with the wound infection.  Patient may require partial bone resection.  Patient may require prolonged IV antibiotics and repeat surgical debridement.  Risk and benefits of surgery were discussed including persistent infection nonhealing of the wound need for additional surgery.  Patient states he understands wished to proceed at this time.  Thank you for the consult and the opportunity to see Mr. Abishai Viegas, [place] [phone] 7:28 AM

## 2022-06-16 NOTE — Progress Notes (Signed)
Patient arrived from his procedure vitals are stable and pain medication is given at this time.

## 2022-06-16 NOTE — Anesthesia Procedure Notes (Signed)
Procedure Name: LMA Insertion Date/Time: 06/16/2022 1:42 PM  Performed by: Darletta Moll, CRNAPre-anesthesia Checklist: Patient identified, Emergency Drugs available, Suction available and Patient being monitored Patient Re-evaluated:Patient Re-evaluated prior to induction Oxygen Delivery Method: Circle system utilized Preoxygenation: Pre-oxygenation with 100% oxygen Induction Type: IV induction Ventilation: Mask ventilation without difficulty LMA: LMA inserted LMA Size: 4.0 Tube type: Oral Number of attempts: 1 Placement Confirmation: positive ETCO2, breath sounds checked- equal and bilateral and CO2 detector Tube secured with: Tape Dental Injury: Teeth and Oropharynx as per pre-operative assessment

## 2022-06-16 NOTE — Progress Notes (Addendum)
[facility] PROGRESS NOTE   [name] Fernandez YIR:[id number] DOB: Nov 13, 1940 DOA: 06/15/2022  PCP: Unk Pinto, MD  Brief History/Interval Summary: 82 y.o. male with medical history significant of GERD status post Nissen fundoplication, hypothyroidism, hypertension, hyperlipidemia, celiac disease, anemia, COPD, CKD 2, venous insufficiency, chronic pain presenting with worsening wound infection involving his left ankle area.  He has been on prolonged oral antibiotic regimen with levofloxacin without any improvement.  He is followed at the wound center.  He was sent to the emergency department.  He was hospitalized for further management.  Orthopedics consulted. MRI of the left ankle showed evidence of the ulceration with endosteal edema at the lateral malleolus suspicious for reactive inflammation, and not consistent with osteomyelitis.  Tenosynovitis of the common tibial tendon sheath also noted.    Consultants: Orthopedics, Dr. Sharol Given  Procedures: Debridement is planned for later today    Subjective/Interval History: Complains of pain in the left ankle, 6 out of 10 in intensity.  Denies any chest pain shortness of breath nausea or vomiting.    Assessment/Plan:  Left ankle wound infection/tenosynovitis MRI was done.  Orthopedics has been consulted.  Plan is for debridement later today.  May need prolonged IV antibiotics per orthopedics. Currently on Zosyn. He was on levofloxacin in the outpatient setting until about a week prior to admission. Pain control. CRP normal at 0.8.  WBC normal.  Follow-up on blood cultures.  Follow-up on wound cultures after surgical debridement. May need to involve infectious disease at some point in time.  Chronic kidney disease stage II Stable.  Essential hypertension Blood pressure with occasional high readings noted.  Noted to be on torsemide which is mainly for his LE edema. Not on any other anti-hypertensives.  Normocytic anemia Monitor  drop in hemoglobin is likely dilutional.  No evidence for overt bleeding.  Recheck labs tomorrow.  Anemia panel.  Hypothyroidism Continue levothyroxine.  DVT Prophylaxis: Start Lovenox after he undergoes surgical debridement Code Status: Full code Family Communication: Discussed with patient Disposition Plan: Hopefully return home when improved  Status is: Observation The patient will require care spanning > 2 midnights and should be moved to inpatient because: Need surgical debridement for bone infection      Medications: Scheduled:  pantoprazole  40 mg Oral Daily   sodium chloride flush  3 mL Intravenous Q12H   torsemide  20 mg Oral q AM   Continuous:  piperacillin-tazobactam (ZOSYN)  IV 3.375 g (06/16/22 0604)   JKK:[id number] **OR** acetaminophen, gabapentin, polyethylene glycol, traMADol  Antibiotics: Anti-infectives (From admission, onward)    Start     Dose/Rate Route Frequency Ordered Stop   06/15/22 1715  piperacillin-tazobactam (ZOSYN) IVPB 3.375 g        3.375 g 12.5 mL/hr over 240 Minutes Intravenous Every 8 hours 06/15/22 1702     06/15/22 1645  vancomycin (VANCOCIN) IVPB 1000 mg/200 mL premix  Status:  Discontinued        1,000 mg 200 mL/hr over 60 Minutes Intravenous  Once 06/15/22 1633 06/15/22 1652   06/15/22 1645  piperacillin-tazobactam (ZOSYN) IVPB 3.375 g  Status:  Discontinued        3.375 g 100 mL/hr over 30 Minutes Intravenous  Once 06/15/22 1633 06/15/22 1849       Objective:  Vital Signs  Vitals:   06/15/22 2004 06/15/22 2152 06/16/22 0020 06/16/22 0419  BP:   (!) 159/84 128/75  Pulse: 69  67 71  Resp: 16  16 16   Temp: 98 F (  36.7 C)  97.8 F (36.6 C) 97.7 F (36.5 C)  TempSrc: Oral  Oral Oral  SpO2: 98%  96% 96%  Height:  5\' 6"  (1.676 m)      Intake/Output Summary (Last 24 hours) at 06/16/2022 1018 Last data filed at 06/16/2022 0356 Gross per 24 hour  Intake 105.13 ml  Output --  Net 105.13 ml   There were no vitals filed  for this visit.  General appearance: Awake alert.  In no distress Resp: Clear to auscultation bilaterally.  Normal effort Cardio: S1-S2 is normal regular.  No S3-S4.  No rubs murmurs or bruit GI: Abdomen is soft.  Nontender nondistended.  Bowel sounds are present normal.  No masses organomegaly Extremities: Wound noted over the lateral aspect of the left ankle area.  Yellowish exudate noted.  No active drainage noted.  Black necrotic area noted. Neurologic: Alert and oriented x3.  No focal neurological deficits.    Lab Results:  Data Reviewed: I have personally reviewed following labs and reports of the imaging studies  CBC: Recent Labs  Lab 06/14/22 1315 06/15/22 1305 06/16/22 0440  WBC 9.4 8.6 9.2  NEUTROABS 6.6 5.7  --   HGB 13.3 12.9* 11.5*  HCT 39.8 37.9* 33.6*  MCV 94.8 95.5 94.9  PLT 459* 429* 412*    Basic Metabolic Panel: Recent Labs  Lab 06/14/22 1315 06/15/22 1305 06/16/22 0440  NA 139 140 140  K 4.0 4.4 4.3  CL 104 104 106  CO2 28 25 29   GLUCOSE 104* 164* 111*  BUN 19 16 16   CREATININE 0.86 1.03 0.94  CALCIUM 9.1 9.2 8.7*    GFR: Estimated Creatinine Clearance: 61.8 mL/min (by C-G formula based on SCr of 0.94 mg/dL).  Liver Function Tests: Recent Labs  Lab 06/14/22 1315 06/16/22 0440  AST 24 21  ALT 20 18  ALKPHOS 76 69  BILITOT 0.7 0.9  PROT 7.8 6.1*  ALBUMIN 3.8 2.9*     Recent Results (from the past 240 hour(s))  MRSA Next Gen by PCR, Nasal     Status: None   Collection Time: 06/14/22  8:02 PM   Specimen: Nasal Mucosa; Nasal Swab  Result Value Ref Range Status   MRSA by PCR Next Gen NOT DETECTED NOT DETECTED Final    Comment: (NOTE) The GeneXpert MRSA Assay (FDA approved for NASAL specimens only), is one component of a comprehensive MRSA colonization surveillance program. It is not intended to diagnose MRSA infection nor to guide or monitor treatment for MRSA infections. Test performance is not FDA approved in patients less than 62  years old. Performed at [facility], [place] [address], [place], [place] [address]       Radiology Studies: MR ANKLE LEFT WO CONTRAST  Result Date: 06/14/2022 CLINICAL DATA:  Nonhealing wound along the lateral left ankle, discoloration of the skin. EXAM: MRI OF THE LEFT ANKLE WITHOUT CONTRAST TECHNIQUE: Multiplanar, multisequence MR imaging of the ankle was performed. No intravenous contrast was administered. COMPARISON:  Radiographs 01/14/2013 FINDINGS: TENDONS Peroneal: Common peroneus tendon sheath tenosynovitis. Posteromedial: Mild tibialis posterior tenosynovitis. Anterior: Unremarkable Achilles: Unremarkable Plantar Fascia: Mild thickening of the medial and lateral bands of the plantar fascia proximally. Plantar calcaneal spur noted. LIGAMENTS Lateral: Thin/attenuated ATFL probably from remote injury. The ATFL does not currently appear overtly discontinuous. No surrounding synovitis. Medial: Unremarkable CARTILAGE Ankle Joint: Unremarkable Subtalar Joints/Sinus Tarsi: Unremarkable Bones: Subtle endosteal edema along the lateral malleolus underlying the region of cutaneous irregularity. Other: Cutaneous thinning and  irregularity superficial to the lateral malleolus and tracking along the anterolateral ankle region compatible with ulceration. No drainable abscess observed. IMPRESSION: 1. Cutaneous thinning and irregularity superficial to the lateral malleolus and tracking along the anterolateral ankle region compatible with ulceration. No drainable abscess observed. 2. Subtle endosteal edema along the lateral malleolus underlying the region of cutaneous irregularity, probably reactive to local inflammation and not of a magnitude to be specific for osteomyelitis at this time. 3. Common peroneus tendon sheath tenosynovitis. Mild tibialis posterior tenosynovitis. 4. Mild thickening of the medial and lateral bands of the plantar fascia proximally, with plantar calcaneal spur. 5.  Thin/attenuated ATFL probably from remote injury. The ATFL does not currently appear overtly discontinuous. Electronically Signed   By: Gaylyn Rong M.D.   On: 06/14/2022 14:32       LOS: 0 days   [name] Fernandez  [facility] Pager on [email or web address]  06/16/2022, 10:18 AM

## 2022-06-16 NOTE — Transfer of Care (Signed)
Immediate Anesthesia Transfer of Care Note  Patient: [name] Fernandez  Procedure(s) Performed: IRRIGATION AND DEBRIDEMENT ANKLE (Left: Ankle)  Patient Location: PACU  Anesthesia Type:General  Level of Consciousness: drowsy and patient cooperative  Airway & Oxygen Therapy: Patient Spontanous Breathing and Patient connected to nasal cannula oxygen  Post-op Assessment: Report given to RN, Post -op Vital signs reviewed and stable, and Patient moving all extremities X 4  Post vital signs: Reviewed and stable  Last Vitals:  Vitals Value Taken Time  BP 106/68 06/16/22 1411  Temp    Pulse 72 06/16/22 1414  Resp 17 06/16/22 1414  SpO2 96 % 06/16/22 1414  Vitals shown include unvalidated device data.  Last Pain:  Vitals:   06/16/22 1310  TempSrc: Oral  PainSc:          Complications: No notable events documented.

## 2022-06-16 NOTE — Anesthesia Preprocedure Evaluation (Addendum)
Anesthesia Evaluation  Patient identified by MRN, date of birth, ID band Patient awake    Reviewed: Allergy & Precautions, NPO status , Patient's Chart, lab work & pertinent test results  Airway Mallampati: II  TM Distance: >3 FB Neck ROM: Full    Dental  (+) Partial Upper   Pulmonary COPD, former smoker   Pulmonary exam normal        Cardiovascular hypertension,  Rhythm:Regular Rate:Normal     Neuro/Psych negative neurological ROS  negative psych ROS   GI/Hepatic Neg liver ROS, hiatal hernia,GERD  Medicated,,  Endo/Other  diabetesHypothyroidism    Renal/GU   negative genitourinary   Musculoskeletal  (+) Arthritis , Osteoarthritis,  Left ankle wound   Abdominal Normal abdominal exam  (+)   Peds  Hematology  (+) Blood dyscrasia, anemia   Anesthesia Other Findings   Reproductive/Obstetrics                             Anesthesia Physical Anesthesia Plan  ASA: 3  Anesthesia Plan: General   Post-op Pain Management:    Induction: Intravenous  PONV Risk Score and Plan: 2 and Ondansetron, Dexamethasone and Treatment may vary due to age or medical condition  Airway Management Planned: Mask and LMA  Additional Equipment: None  Intra-op Plan:   Post-operative Plan: Extubation in OR  Informed Consent: I have reviewed the patients History and Physical, chart, labs and discussed the procedure including the risks, benefits and alternatives for the proposed anesthesia with the patient or authorized representative who has indicated his/her understanding and acceptance.     Dental advisory given  Plan Discussed with: CRNA  Anesthesia Plan Comments:        Anesthesia Quick Evaluation

## 2022-06-16 NOTE — Anesthesia Postprocedure Evaluation (Signed)
Anesthesia Post Note  Patient: [name] Fernandez  Procedure(s) Performed: IRRIGATION AND DEBRIDEMENT ANKLE (Left: Ankle)     Patient location during evaluation: PACU Anesthesia Type: General Level of consciousness: awake and alert Pain management: pain level controlled Vital Signs Assessment: post-procedure vital signs reviewed and stable Respiratory status: spontaneous breathing, nonlabored ventilation, respiratory function stable and patient connected to nasal cannula oxygen Cardiovascular status: blood pressure returned to baseline and stable Postop Assessment: no apparent nausea or vomiting Anesthetic complications: no   No notable events documented.  Last Vitals:  Vitals:   06/16/22 1450 06/16/22 1513  BP:  137/80  Pulse: 67 66  Resp: 12   Temp: 36.5 C (!) 36.4 C  SpO2: 94% 97%    Last Pain:  Vitals:   06/16/22 1513  TempSrc: Oral  PainSc: 10-Worst pain ever                 Belenda Cruise P Khalaya Mcgurn

## 2022-06-17 DIAGNOSIS — K219 Gastro-esophageal reflux disease without esophagitis: Secondary | ICD-10-CM | POA: Diagnosis present

## 2022-06-17 DIAGNOSIS — Z800 Family history of malignant neoplasm of digestive organs: Secondary | ICD-10-CM | POA: Diagnosis not present

## 2022-06-17 DIAGNOSIS — M8618 Other acute osteomyelitis, other site: Secondary | ICD-10-CM | POA: Diagnosis not present

## 2022-06-17 DIAGNOSIS — B965 Pseudomonas (aeruginosa) (mallei) (pseudomallei) as the cause of diseases classified elsewhere: Secondary | ICD-10-CM | POA: Diagnosis present

## 2022-06-17 DIAGNOSIS — E871 Hypo-osmolality and hyponatremia: Secondary | ICD-10-CM | POA: Diagnosis present

## 2022-06-17 DIAGNOSIS — I129 Hypertensive chronic kidney disease with stage 1 through stage 4 chronic kidney disease, or unspecified chronic kidney disease: Secondary | ICD-10-CM | POA: Diagnosis present

## 2022-06-17 DIAGNOSIS — Z79890 Hormone replacement therapy (postmenopausal): Secondary | ICD-10-CM | POA: Diagnosis not present

## 2022-06-17 DIAGNOSIS — E11622 Type 2 diabetes mellitus with other skin ulcer: Secondary | ICD-10-CM | POA: Diagnosis present

## 2022-06-17 DIAGNOSIS — E1122 Type 2 diabetes mellitus with diabetic chronic kidney disease: Secondary | ICD-10-CM | POA: Diagnosis present

## 2022-06-17 DIAGNOSIS — Z91040 Latex allergy status: Secondary | ICD-10-CM | POA: Diagnosis not present

## 2022-06-17 DIAGNOSIS — N182 Chronic kidney disease, stage 2 (mild): Secondary | ICD-10-CM | POA: Diagnosis present

## 2022-06-17 DIAGNOSIS — L97329 Non-pressure chronic ulcer of left ankle with unspecified severity: Secondary | ICD-10-CM | POA: Diagnosis present

## 2022-06-17 DIAGNOSIS — Z888 Allergy status to other drugs, medicaments and biological substances status: Secondary | ICD-10-CM | POA: Diagnosis not present

## 2022-06-17 DIAGNOSIS — Z87891 Personal history of nicotine dependence: Secondary | ICD-10-CM | POA: Diagnosis not present

## 2022-06-17 DIAGNOSIS — M659 Synovitis and tenosynovitis, unspecified: Secondary | ICD-10-CM | POA: Diagnosis present

## 2022-06-17 DIAGNOSIS — E039 Hypothyroidism, unspecified: Secondary | ICD-10-CM | POA: Diagnosis present

## 2022-06-17 DIAGNOSIS — Z1623 Resistance to quinolones and fluoroquinolones: Secondary | ICD-10-CM | POA: Diagnosis present

## 2022-06-17 DIAGNOSIS — E785 Hyperlipidemia, unspecified: Secondary | ICD-10-CM | POA: Diagnosis present

## 2022-06-17 DIAGNOSIS — Z881 Allergy status to other antibiotic agents status: Secondary | ICD-10-CM | POA: Diagnosis not present

## 2022-06-17 DIAGNOSIS — D649 Anemia, unspecified: Secondary | ICD-10-CM | POA: Diagnosis not present

## 2022-06-17 DIAGNOSIS — I872 Venous insufficiency (chronic) (peripheral): Secondary | ICD-10-CM | POA: Diagnosis present

## 2022-06-17 DIAGNOSIS — A498 Other bacterial infections of unspecified site: Secondary | ICD-10-CM | POA: Diagnosis not present

## 2022-06-17 DIAGNOSIS — D631 Anemia in chronic kidney disease: Secondary | ICD-10-CM | POA: Diagnosis present

## 2022-06-17 DIAGNOSIS — Z96651 Presence of right artificial knee joint: Secondary | ICD-10-CM | POA: Diagnosis present

## 2022-06-17 DIAGNOSIS — J449 Chronic obstructive pulmonary disease, unspecified: Secondary | ICD-10-CM | POA: Diagnosis present

## 2022-06-17 DIAGNOSIS — G894 Chronic pain syndrome: Secondary | ICD-10-CM | POA: Diagnosis present

## 2022-06-17 DIAGNOSIS — K900 Celiac disease: Secondary | ICD-10-CM | POA: Diagnosis present

## 2022-06-17 DIAGNOSIS — I10 Essential (primary) hypertension: Secondary | ICD-10-CM | POA: Diagnosis not present

## 2022-06-17 LAB — CBC
HCT: 32.5 % — ABNORMAL LOW (ref 39.0–52.0)
Hemoglobin: 11.3 g/dL — ABNORMAL LOW (ref 13.0–17.0)
MCH: 32.4 pg (ref 26.0–34.0)
MCHC: 34.8 g/dL (ref 30.0–36.0)
MCV: 93.1 fL (ref 80.0–100.0)
Platelets: 356 10*3/uL (ref 150–400)
RBC: 3.49 MIL/uL — ABNORMAL LOW (ref 4.22–5.81)
RDW: 15.1 % (ref 11.5–15.5)
WBC: 9.8 10*3/uL (ref 4.0–10.5)
nRBC: 0 % (ref 0.0–0.2)

## 2022-06-17 LAB — BASIC METABOLIC PANEL
Anion gap: 11 (ref 5–15)
BUN: 15 mg/dL (ref 8–23)
CO2: 26 mmol/L (ref 22–32)
Calcium: 8.6 mg/dL — ABNORMAL LOW (ref 8.9–10.3)
Chloride: 98 mmol/L (ref 98–111)
Creatinine, Ser: 1.17 mg/dL (ref 0.61–1.24)
GFR, Estimated: >60 mL/min (ref >60)
Glucose, Bld: 128 mg/dL — ABNORMAL HIGH (ref 70–99)
Potassium: 4.1 mmol/L (ref 3.5–5.1)
Sodium: 135 mmol/L (ref 135–145)

## 2022-06-17 LAB — RETICULOCYTES
Immature Retic Fract: 5.7 % (ref 2.3–15.9)
RBC.: 3.43 MIL/uL — ABNORMAL LOW (ref 4.22–5.81)
Retic Count, Absolute: 50.4 10*3/uL (ref 19.0–186.0)
Retic Ct Pct: 1.5 % (ref 0.4–3.1)

## 2022-06-17 LAB — IRON AND TIBC
Iron: 122 ug/dL (ref 45–182)
Saturation Ratios: 32 % (ref 17.9–39.5)
TIBC: 384 ug/dL (ref 250–450)
UIBC: 262 ug/dL

## 2022-06-17 LAB — FERRITIN: Ferritin: 21 ng/mL — ABNORMAL LOW (ref 24–336)

## 2022-06-17 LAB — VITAMIN B12: Vitamin B-12: 400 pg/mL (ref 180–914)

## 2022-06-17 LAB — GLUCOSE, CAPILLARY
Glucose-Capillary: 107 mg/dL — ABNORMAL HIGH (ref 70–99)
Glucose-Capillary: 124 mg/dL — ABNORMAL HIGH (ref 70–99)
Glucose-Capillary: 93 mg/dL (ref 70–99)

## 2022-06-17 LAB — FOLATE: Folate: 14.6 ng/mL (ref >5.9)

## 2022-06-17 MED ORDER — HYDROMORPHONE HCL 1 MG/ML IJ SOLN
0.5000 mg | INTRAMUSCULAR | Status: DC | PRN
  Administered 2022-06-18 – 2022-06-19 (×4): 1 mg via INTRAVENOUS
  Filled 2022-06-17 (×4): qty 1

## 2022-06-17 NOTE — Progress Notes (Signed)
Patient ID: [name] Fernandez, male   DOB: 09-Aug-1940, 82 y.o.   MRN: [id number] Patient is postoperative day 1 debridement ulceration left distal ankle.  Patient had infection involvement including the skin and soft tissue fascia and bone.  The soft tissue cultures and bone cultures are both showing gram-negative rods.  There is 50 cc in the wound VAC canister with a good suction fit.  Patient will discharge with the Praveena plus portable wound VAC pump.  Patient will need long-term antibiotics.  Patient may need recommendations from infectious disease.  Currently on Zosyn.

## 2022-06-17 NOTE — Progress Notes (Signed)
[facility] PROGRESS NOTE   HAL NORRINGTON ZOX:[id number] DOB: 11-Nov-1940 DOA: 06/15/2022  PCP: Lucky Cowboy, MD  Brief History/Interval Summary: 82 y.o. male with medical history significant of GERD status post Nissen fundoplication, hypothyroidism, hypertension, hyperlipidemia, celiac disease, anemia, COPD, CKD 2, venous insufficiency, chronic pain presenting with worsening wound infection involving his left ankle area.  He has been on prolonged oral antibiotic regimen with levofloxacin without any improvement.  He is followed at the wound center.  He was sent to the emergency department.  He was hospitalized for further management.  Orthopedics consulted. MRI of the left ankle showed evidence of the ulceration with endosteal edema at the lateral malleolus suspicious for reactive inflammation, and not consistent with osteomyelitis.  Tenosynovitis of the common tibial tendon sheath also noted.    Consultants: Orthopedics, Dr. Lajoyce Corners  Procedures:  On 1/5: PROCEDURE:  IRRIGATION AND DEBRIDEMENT ANKLE Excision skin and soft tissue muscle fascia and partial excision of left distal fibula. Application of Kerecis tissue graft: 38 cm micro graft and 7 x 10 cm sheet. Application of cleanse choice wound VAC sponge x 1. Tissue sent for cultures and bone sent for culture separately.    Subjective/Interval History: Complains of 7 out of 10 pain in the left ankle area.  No chest pain shortness of breath.  No nausea or vomiting.     Assessment/Plan:  Left ankle wound infection/tenosynovitis MRI was done.  Patient seen by orthopedics.  Underwent irrigation and debridement of the wound on 1/5.  See operative note for further details. Tissue was sent for cultures.  Results are pending. Continue with Zosyn for now.  Depending on the results of the culture antibiotics may need to be changed.  Per orthopedics patient may need prolonged IV antibiotics.   He was on levofloxacin in the outpatient  setting until about a week prior to admission. Pain is poorly controlled.  Will adjust his treatments.   Labs are pending from this morning. May need to involve infectious disease at some point in time.  Chronic kidney disease stage II Stable.  Essential hypertension Blood pressure with occasional high readings noted.  Noted to be on torsemide which is mainly for his LE edema. Not on any other anti-hypertensives.  Normocytic anemia Drop in hemoglobin likely dilutional.  Waiting on labs from this morning.  Anemia panel is pending.  Hypothyroidism Continue levothyroxine.  DVT Prophylaxis: LOVENOX Code Status: Full code Family Communication: Discussed with patient Disposition Plan: Hopefully return home when improved  Status is: Inpatient Remains inpatient appropriate because: Wound infection left ankle     Medications: Scheduled:  docusate sodium  100 mg Oral BID   enoxaparin (LOVENOX) injection  40 mg Subcutaneous Q24H   pantoprazole  40 mg Oral Daily   sodium chloride flush  3 mL Intravenous Q12H   torsemide  20 mg Oral q AM   Continuous:  sodium chloride     methocarbamol (ROBAXIN) IV     piperacillin-tazobactam (ZOSYN)  IV 3.375 g (06/17/22 0604)   WJX:BJYNWGNFAOZHY, bisacodyl, gabapentin, HYDROmorphone (DILAUDID) injection, magnesium citrate, methocarbamol **OR** methocarbamol (ROBAXIN) IV, metoCLOPramide **OR** metoCLOPramide (REGLAN) injection, ondansetron **OR** ondansetron (ZOFRAN) IV, oxyCODONE, oxyCODONE, polyethylene glycol, traMADol  Antibiotics: Anti-infectives (From admission, onward)    Start     Dose/Rate Route Frequency Ordered Stop   06/17/22 0600  ceFAZolin (ANCEF) IVPB 2g/100 mL premix        2 g 200 mL/hr over 30 Minutes Intravenous On call to O.R. 06/16/22 1249 06/16/22 1411  06/16/22 1259  ceFAZolin (ANCEF) 2-4 GM/100ML-% IVPB       Note to Pharmacy: Shanda Bumps M: cabinet override      06/16/22 1259 06/16/22 1347   06/15/22 1715   piperacillin-tazobactam (ZOSYN) IVPB 3.375 g        3.375 g 12.5 mL/hr over 240 Minutes Intravenous Every 8 hours 06/15/22 1702     06/15/22 1645  vancomycin (VANCOCIN) IVPB 1000 mg/200 mL premix  Status:  Discontinued        1,000 mg 200 mL/hr over 60 Minutes Intravenous  Once 06/15/22 1633 06/15/22 1652   06/15/22 1645  piperacillin-tazobactam (ZOSYN) IVPB 3.375 g  Status:  Discontinued        3.375 g 100 mL/hr over 30 Minutes Intravenous  Once 06/15/22 1633 06/15/22 1849       Objective:  Vital Signs  Vitals:   06/16/22 2031 06/17/22 0617 06/17/22 0621 06/17/22 0819  BP: (!) 144/117 124/77 123/87 121/72  Pulse: 96 87 89 88  Resp: 16 16 16 20   Temp: 98.1 F (36.7 C) (!) 97.5 F (36.4 C) 98.4 F (36.9 C) (!) 96.1 F (35.6 C)  TempSrc:  Oral  Axillary  SpO2: 93% 95% 96% 95%  Height:        Intake/Output Summary (Last 24 hours) at 06/17/2022 0934 Last data filed at 06/17/2022 08/16/2022 Gross per 24 hour  Intake 667.91 ml  Output 220 ml  Net 447.91 ml    There were no vitals filed for this visit.  General appearance: Awake alert.  In no distress Resp: Clear to auscultation bilaterally.  Normal effort Cardio: S1-S2 is normal regular.  No S3-S4.  No rubs murmurs or bruit GI: Abdomen is soft.  Nontender nondistended.  Bowel sounds are present normal.  No masses organomegaly Extremities: Dressing noted over left ankle area with wound VAC in place. Neurologic: Alert and oriented x3.  No focal neurological deficits.     Lab Results:  Data Reviewed: I have personally reviewed following labs and reports of the imaging studies  CBC: Recent Labs  Lab 06/14/22 1315 06/15/22 1305 06/16/22 0440  WBC 9.4 8.6 9.2  NEUTROABS 6.6 5.7  --   HGB 13.3 12.9* 11.5*  HCT 39.8 37.9* 33.6*  MCV 94.8 95.5 94.9  PLT 459* 429* 412*     Basic Metabolic Panel: Recent Labs  Lab 06/14/22 1315 06/15/22 1305 06/16/22 0440  NA 139 140 140  K 4.0 4.4 4.3  CL 104 104 106  CO2 28 25 29    GLUCOSE 104* 164* 111*  BUN 19 16 16   CREATININE 0.86 1.03 0.94  CALCIUM 9.1 9.2 8.7*     GFR: Estimated Creatinine Clearance: 61.8 mL/min (by C-G formula based on SCr of 0.94 mg/dL).  Liver Function Tests: Recent Labs  Lab 06/14/22 1315 06/16/22 0440  AST 24 21  ALT 20 18  ALKPHOS 76 69  BILITOT 0.7 0.9  PROT 7.8 6.1*  ALBUMIN 3.8 2.9*      Recent Results (from the past 240 hour(s))  MRSA Next Gen by PCR, Nasal     Status: None   Collection Time: 06/14/22  8:02 PM   Specimen: Nasal Mucosa; Nasal Swab  Result Value Ref Range Status   MRSA by PCR Next Gen NOT DETECTED NOT DETECTED Final    Comment: (NOTE) The GeneXpert MRSA Assay (FDA approved for NASAL specimens only), is one component of a comprehensive MRSA colonization surveillance program. It is not intended to diagnose MRSA infection nor to  guide or monitor treatment for MRSA infections. Test performance is not FDA approved in patients less than 17 years old. Performed at [facility], [place] [address], [place], [place] [address]   Culture, blood (routine x 2)     Status: None (Preliminary result)   Collection Time: 06/15/22 12:49 PM   Specimen: BLOOD  Result Value Ref Range Status   Specimen Description BLOOD RIGHT ANTECUBITAL  Final   Special Requests   Final    AEROBIC BOTTLE ONLY Blood Culture results may not be optimal due to an inadequate volume of blood received in culture bottles   Culture   Final    NO GROWTH < 24 HOURS Performed at [facility], [place] [address], [place], [place] [address]    Report Status PENDING  Incomplete  Culture, blood (Routine X 2) w Reflex to ID Panel     Status: None (Preliminary result)   Collection Time: 06/15/22  4:50 PM   Specimen: BLOOD LEFT ARM  Result Value Ref Range Status   Specimen Description BLOOD LEFT ARM  Final   Special Requests   Final    BOTTLES DRAWN AEROBIC AND ANAEROBIC Blood Culture results may not be optimal due to an  excessive volume of blood received in culture bottles   Culture   Final    NO GROWTH < 24 HOURS Performed at [facility], [place] [address], [place], [place] [address]    Report Status PENDING  Incomplete  Culture, blood (routine x 2)     Status: None (Preliminary result)   Collection Time: 06/15/22  6:57 PM   Specimen: BLOOD LEFT ARM  Result Value Ref Range Status   Specimen Description BLOOD LEFT ARM  Final   Special Requests   Final    BOTTLES DRAWN AEROBIC ONLY Blood Culture adequate volume   Culture   Final    NO GROWTH < 24 HOURS Performed at [facility], [place] [address] [place], [place] [address]    Report Status PENDING  Incomplete  Aerobic/Anaerobic Culture w Gram Stain (surgical/deep wound)     Status: None (Preliminary result)   Collection Time: 06/16/22  1:59 PM   Specimen: Wound  Result Value Ref Range Status   Specimen Description WOUND LEFT ANKLE  Final   Special Requests PT ON ANCEF  Final   Gram Stain   Final    NO WBC SEEN RARE GRAM NEGATIVE RODS Performed at [facility], [place] [address] [place], [place] [address]    Culture PENDING  Incomplete   Report Status PENDING  Incomplete  Aerobic/Anaerobic Culture w Gram Stain (surgical/deep wound)     Status: None (Preliminary result)   Collection Time: 06/16/22  1:59 PM   Specimen: Bone; Tissue  Result Value Ref Range Status   Specimen Description BONE  Final   Special Requests LEFT FIBULA  Final   Gram Stain   Final    FEW WBC PRESENT,BOTH PMN AND MONONUCLEAR RARE GRAM NEGATIVE RODS Performed at [facility], [place] [address], [place],  [address]    Culture PENDING  Incomplete   Report Status PENDING  Incomplete      Radiology Studies: No results found.     LOS: 0 days   Laquitha Heslin [employer] on [email or web address]  06/17/2022, 9:34 AM

## 2022-06-17 NOTE — Evaluation (Addendum)
Physical Therapy Evaluation & Discharge Patient Details Name: [name] Fernandez MRN: [id number] DOB: 1941/02/18 Today's Date: 06/17/2022  History of Present Illness  Pt is an 82 y.o. male admitted 06/15/22 with chronic L ankle wound. S/p L ankle I&D on 1/5. PMH includes HTN, prediabetes, arthritis, back sx, R TKA.   Clinical Impression  Patient evaluated by Physical Therapy with no further acute PT needs identified. PTA, pt independent, lives with supportive wife, retired from work. Today, pt moving well without DME; initial stability caused by darco shoe and wound vac, but improving with ambulation distance. All education has been completed and the patient has no further questions. Pt to continue ambulating with mobility specialist. Acute PT is signing off. Thank you for this referral.   Recommendations for follow up therapy are one component of a multi-disciplinary discharge planning process, led by the attending physician.  Recommendations may be updated based on patient status, additional functional criteria and insurance authorization.  Follow Up Recommendations No PT follow up      Assistance Recommended at Discharge Intermittent Supervision/Assistance  Patient can return home with the following  A little help with bathing/dressing/bathroom;Assistance with cooking/housework;Assist for transportation;Help with stairs or ramp for entrance    Equipment Recommendations None recommended by PT  Recommendations for Other Services  Mobility Specialist     Functional Status Assessment       Precautions / Restrictions Precautions Precautions: Fall;Other (comment) Precaution Comments: L ankle wound vac Restrictions Weight Bearing Restrictions: Yes LLE Weight Bearing: Weight bearing as tolerated Other Position/Activity Restrictions: in post-op shoe      Mobility  Bed Mobility Overal bed mobility: Modified Independent                  Transfers Overall transfer level: Needs  assistance Equipment used: None Transfers: Sit to/from Stand Sit to Stand: Supervision                Ambulation/Gait Ambulation/Gait assistance: Min guard Gait Distance (Feet): 300 Feet Assistive device: IV Pole, None Gait Pattern/deviations: Step-through pattern, Decreased stride length, Antalgic Gait velocity: Decreased     General Gait Details: slow, mildly unsteady gait with initial UE support on IV pole to stabilize, progresing to no UE support; intermittent min guard for balance; L darco shoeand R slip on shoe donned  Stairs            Wheelchair Mobility    Modified Rankin (Stroke Patients Only)       Balance Overall balance assessment: Needs assistance   Sitting balance-Leahy Scale: Good Sitting balance - Comments: indep to don shoes sitting EOB   Standing balance support: No upper extremity supported, During functional activity Standing balance-Leahy Scale: Fair Standing balance comment: able to use urinal without UE support                             Pertinent Vitals/Pain Pain Assessment Pain Assessment: Faces Faces Pain Scale: Hurts a little bit Pain Location: LLE Pain Descriptors / Indicators: Discomfort Pain Intervention(s): Monitored during session    Home Living Family/patient expects to be discharged to:: Private residence Living Arrangements: Spouse/significant other Available Help at Discharge: Family;Available 24 hours/day Type of Home: House Home Access: Ramped entrance       Home Layout: One level Home Equipment: None      Prior Function Prior Level of Function : Independent/Modified Independent;Driving             Mobility  Comments: independent withotu DME, retired from work; reports primarily sedentary since dealing with LE wounds the past few months       Hand Dominance        Extremity/Trunk Assessment   Upper Extremity Assessment Upper Extremity Assessment: Overall WFL for tasks assessed     Lower Extremity Assessment Lower Extremity Assessment: Generalized weakness;LLE deficits/detail LLE Deficits / Details: s/p L ankle I&D; hip and knee strength >4/5       Communication   Communication: No difficulties  Cognition Arousal/Alertness: Awake/alert Behavior During Therapy: WFL for tasks assessed/performed Overall Cognitive Status: Within Functional Limits for tasks assessed                                 General Comments: WFL for simple tasks; not formally assessed        General Comments General comments (skin integrity, edema, etc.): pt's wife present and supportive    Exercises     Assessment/Plan    PT Assessment Patient does not need any further PT services  PT Problem List         PT Treatment Interventions      PT Goals (Current goals can be found in the Care Plan section)  Acute Rehab PT Goals PT Goal Formulation: All assessment and education complete, DC therapy    Frequency       Co-evaluation               AM-PAC PT "6 Clicks" Mobility  Outcome Measure Help needed turning from your back to your side while in a flat bed without using bedrails?: None Help needed moving from lying on your back to sitting on the side of a flat bed without using bedrails?: None Help needed moving to and from a bed to a chair (including a wheelchair)?: A Little Help needed standing up from a chair using your arms (e.g., wheelchair or bedside chair)?: A Little Help needed to walk in hospital room?: A Little Help needed climbing 3-5 steps with a railing? : A Little 6 Click Score: 20    End of Session   Activity Tolerance: Patient tolerated treatment well Patient left: in bed;with call bell/phone within reach;with family/visitor present Nurse Communication: Mobility status PT Visit Diagnosis: Other abnormalities of gait and mobility (R26.89)    Time: 1660-6301 PT Time Calculation (min) (ACUTE ONLY): 20 min   Charges:   PT  Evaluation $PT Eval Low Complexity: [place], PT, DPT Acute Rehabilitation Services  Personal: [place] [facility] Office: Miguel Barrera 06/17/2022, 12:33 PM

## 2022-06-18 ENCOUNTER — Encounter (HOSPITAL_COMMUNITY): Payer: Self-pay | Admitting: Orthopedic Surgery

## 2022-06-18 DIAGNOSIS — D649 Anemia, unspecified: Secondary | ICD-10-CM | POA: Diagnosis not present

## 2022-06-18 DIAGNOSIS — M659 Synovitis and tenosynovitis, unspecified: Secondary | ICD-10-CM | POA: Diagnosis not present

## 2022-06-18 DIAGNOSIS — I10 Essential (primary) hypertension: Secondary | ICD-10-CM | POA: Diagnosis not present

## 2022-06-18 LAB — CBC
HCT: 31.4 % — ABNORMAL LOW (ref 39.0–52.0)
Hemoglobin: 10.6 g/dL — ABNORMAL LOW (ref 13.0–17.0)
MCH: 31.8 pg (ref 26.0–34.0)
MCHC: 33.8 g/dL (ref 30.0–36.0)
MCV: 94.3 fL (ref 80.0–100.0)
Platelets: 344 10*3/uL (ref 150–400)
RBC: 3.33 MIL/uL — ABNORMAL LOW (ref 4.22–5.81)
RDW: 14.9 % (ref 11.5–15.5)
WBC: 9.1 10*3/uL (ref 4.0–10.5)
nRBC: 0 % (ref 0.0–0.2)

## 2022-06-18 LAB — BASIC METABOLIC PANEL
Anion gap: 11 (ref 5–15)
BUN: 15 mg/dL (ref 8–23)
CO2: 27 mmol/L (ref 22–32)
Calcium: 8.6 mg/dL — ABNORMAL LOW (ref 8.9–10.3)
Chloride: 98 mmol/L (ref 98–111)
Creatinine, Ser: 1.19 mg/dL (ref 0.61–1.24)
GFR, Estimated: >60 mL/min (ref >60)
Glucose, Bld: 102 mg/dL — ABNORMAL HIGH (ref 70–99)
Potassium: 3.6 mmol/L (ref 3.5–5.1)
Sodium: 136 mmol/L (ref 135–145)

## 2022-06-18 LAB — GLUCOSE, CAPILLARY: Glucose-Capillary: 100 mg/dL — ABNORMAL HIGH (ref 70–99)

## 2022-06-18 MED ORDER — POLYETHYLENE GLYCOL 3350 17 G PO PACK
17.0000 g | PACK | Freq: Every day | ORAL | Status: DC
  Administered 2022-06-18 – 2022-06-20 (×3): 17 g via ORAL
  Filled 2022-06-18 (×4): qty 1

## 2022-06-18 MED ORDER — POTASSIUM CHLORIDE CRYS ER 20 MEQ PO TBCR
40.0000 meq | EXTENDED_RELEASE_TABLET | Freq: Once | ORAL | Status: AC
  Administered 2022-06-18: 40 meq via ORAL
  Filled 2022-06-18: qty 2

## 2022-06-18 MED ORDER — SENNOSIDES-DOCUSATE SODIUM 8.6-50 MG PO TABS
2.0000 | ORAL_TABLET | Freq: Two times a day (BID) | ORAL | Status: DC
  Administered 2022-06-18 – 2022-06-20 (×5): 2 via ORAL
  Filled 2022-06-18 (×6): qty 2

## 2022-06-18 NOTE — Progress Notes (Signed)
[facility] PROGRESS NOTE   ELIOR ROBINETTE ONG:[id number] DOB: 02/17/1941 DOA: 06/15/2022  PCP: Unk Pinto, MD  Brief History/Interval Summary: 82 y.o. male with medical history significant of GERD status post Nissen fundoplication, hypothyroidism, hypertension, hyperlipidemia, celiac disease, anemia, COPD, CKD 2, venous insufficiency, chronic pain presenting with worsening wound infection involving his left ankle area.  He has been on prolonged oral antibiotic regimen with levofloxacin without any improvement.  He is followed at the wound center.  He was sent to the emergency department.  He was hospitalized for further management.  Orthopedics consulted. MRI of the left ankle showed evidence of the ulceration with endosteal edema at the lateral malleolus suspicious for reactive inflammation, and not consistent with osteomyelitis.  Tenosynovitis of the common tibial tendon sheath also noted.    Consultants: Orthopedics, Dr. Sharol Given  Procedures:  On 1/5: PROCEDURE:  IRRIGATION AND DEBRIDEMENT ANKLE Excision skin and soft tissue muscle fascia and partial excision of left distal fibula. Application of Kerecis tissue graft: 38 cm micro graft and 7 x 10 cm sheet. Application of cleanse choice wound VAC sponge x 1. Tissue sent for cultures and bone sent for culture separately.    Subjective/Interval History: Patient mentions that the pain is better controlled today compared to yesterday morning.  Denies any nausea or vomiting.  Still has not had a bowel movement.   Assessment/Plan:  Left ankle wound infection/tenosynovitis MRI was done.  Patient seen by orthopedics.  Underwent irrigation and debridement of the wound on 1/5.  See operative note for further details. Tissue was sent for cultures.  Cultures are preliminarily showing gram-negative rods, few Pseudomonas.  Wait on final identification and sensitivities. Continue with Zosyn for now.  Will need to involve ID to determine  definitive management once final culture reports are available. Per orthopedics patient may need prolonged IV antibiotics.   He was on levofloxacin in the outpatient setting until about a week prior to admission. Pain is better controlled.  WBC remains normal.  He remains afebrile.    Chronic kidney disease stage II Stable.  Essential hypertension Blood pressure has been well-controlled over the last 24 hours.   Noted to be on torsemide which is mainly for his LE edema. Not on any other anti-hypertensives.  Normocytic anemia Drop in hemoglobin likely dilutional.  No overt bleeding noted.   Anemia panel reviewed.  Ferritin 21, iron 122, TIBC 384, percent saturation 32, vitamin B12 400, folate 14.6.  Hypothyroidism Continue levothyroxine.  DVT Prophylaxis: LOVENOX Code Status: Full code Family Communication: Discussed with patient Disposition Plan: Hopefully return home when improved  Status is: Inpatient Remains inpatient appropriate because: Wound infection left ankle     Medications: Scheduled:  enoxaparin (LOVENOX) injection  40 mg Subcutaneous Q24H   pantoprazole  40 mg Oral Daily   polyethylene glycol  17 g Oral Daily   senna-docusate  2 tablet Oral BID   sodium chloride flush  3 mL Intravenous Q12H   torsemide  20 mg Oral q AM   Continuous:  sodium chloride     methocarbamol (ROBAXIN) IV     piperacillin-tazobactam (ZOSYN)  IV 3.375 g (06/18/22 0559)   GMW:NUUVOZDGUYQIH, bisacodyl, gabapentin, HYDROmorphone (DILAUDID) injection, magnesium citrate, methocarbamol **OR** methocarbamol (ROBAXIN) IV, metoCLOPramide **OR** metoCLOPramide (REGLAN) injection, ondansetron **OR** ondansetron (ZOFRAN) IV, oxyCODONE, oxyCODONE, traMADol  Antibiotics: Anti-infectives (From admission, onward)    Start     Dose/Rate Route Frequency Ordered Stop   06/17/22 0600  ceFAZolin (ANCEF) IVPB 2g/100 mL premix  2 g 200 mL/hr over 30 Minutes Intravenous On call to O.R. 06/16/22  1249 06/16/22 1411   06/16/22 1259  ceFAZolin (ANCEF) 2-4 GM/100ML-% IVPB       Note to Pharmacy: Cameron Sprang M: cabinet override      06/16/22 1259 06/16/22 1347   06/15/22 1715  piperacillin-tazobactam (ZOSYN) IVPB 3.375 g        3.375 g 12.5 mL/hr over 240 Minutes Intravenous Every 8 hours 06/15/22 1702     06/15/22 1645  vancomycin (VANCOCIN) IVPB 1000 mg/200 mL premix  Status:  Discontinued        1,000 mg 200 mL/hr over 60 Minutes Intravenous  Once 06/15/22 1633 06/15/22 1652   06/15/22 1645  piperacillin-tazobactam (ZOSYN) IVPB 3.375 g  Status:  Discontinued        3.375 g 100 mL/hr over 30 Minutes Intravenous  Once 06/15/22 1633 06/15/22 1849       Objective:  Vital Signs  Vitals:   06/17/22 0819 06/17/22 1756 06/17/22 2126 06/18/22 0528  BP: 121/72 120/72 126/71 (!) 116/93  Pulse: 88 73 80 72  Resp: 20 18 17 17   Temp: (!) 96.1 F (35.6 C) 97.8 F (36.6 C) 98 F (36.7 C) 98 F (36.7 C)  TempSrc: Axillary Oral    SpO2: 95% 94% 97% 94%  Height:        Intake/Output Summary (Last 24 hours) at 06/18/2022 0840 Last data filed at 06/17/2022 [id number] Gross per 24 hour  Intake --  Output 200 ml  Net -200 ml    There were no vitals filed for this visit.  General appearance: Awake alert.  In no distress Resp: Clear to auscultation bilaterally.  Normal effort Cardio: S1-S2 is normal regular.  No S3-S4.  No rubs murmurs or bruit GI: Abdomen is soft.  Nontender nondistended.  Bowel sounds are present normal.  No masses organomegaly Extremities: Bilateral lower extremities covered in dressing.  Wound VAC noted on the left. Neurologic: Alert and oriented x3.  No focal neurological deficits.      Lab Results:  Data Reviewed: I have personally reviewed following labs and reports of the imaging studies  CBC: Recent Labs  Lab 06/14/22 1315 06/15/22 1305 06/16/22 0440 06/17/22 1033 06/18/22 0219  WBC 9.4 8.6 9.2 9.8 9.1  NEUTROABS 6.6 5.7  --   --   --   HGB  13.3 12.9* 11.5* 11.3* 10.6*  HCT 39.8 37.9* 33.6* 32.5* 31.4*  MCV 94.8 95.5 94.9 93.1 94.3  PLT 459* 429* 412* 356 344     Basic Metabolic Panel: Recent Labs  Lab 06/14/22 1315 06/15/22 1305 06/16/22 0440 06/17/22 1033 06/18/22 0219  NA 139 140 140 135 136  K 4.0 4.4 4.3 4.1 3.6  CL 104 104 106 98 98  CO2 28 25 29 26 27   GLUCOSE 104* 164* 111* 128* 102*  BUN 19 16 16 15 15   CREATININE 0.86 1.03 0.94 1.17 1.19  CALCIUM 9.1 9.2 8.7* 8.6* 8.6*     GFR: Estimated Creatinine Clearance: 48.8 mL/min (by C-G formula based on SCr of 1.19 mg/dL).  Liver Function Tests: Recent Labs  Lab 06/14/22 1315 06/16/22 0440  AST 24 21  ALT 20 18  ALKPHOS 76 69  BILITOT 0.7 0.9  PROT 7.8 6.1*  ALBUMIN 3.8 2.9*      Recent Results (from the past 240 hour(s))  MRSA Next Gen by PCR, Nasal     Status: None   Collection Time: 06/14/22  8:02 PM  Specimen: Nasal Mucosa; Nasal Swab  Result Value Ref Range Status   MRSA by PCR Next Gen NOT DETECTED NOT DETECTED Final    Comment: (NOTE) The GeneXpert MRSA Assay (FDA approved for NASAL specimens only), is one component of a comprehensive MRSA colonization surveillance program. It is not intended to diagnose MRSA infection nor to guide or monitor treatment for MRSA infections. Test performance is not FDA approved in patients less than 9 years old. Performed at [facility], [address], [place], [place] [address]   Culture, blood (routine x 2)     Status: None (Preliminary result)   Collection Time: 06/15/22 12:49 PM   Specimen: BLOOD  Result Value Ref Range Status   Specimen Description BLOOD RIGHT ANTECUBITAL  Final   Special Requests   Final    AEROBIC BOTTLE ONLY Blood Culture results may not be optimal due to an inadequate volume of blood received in culture bottles   Culture   Final    NO GROWTH < 24 HOURS Performed at [facility], [address] [place], [place] [address]    Report Status  PENDING  Incomplete  Culture, blood (Routine X 2) w Reflex to ID Panel     Status: None (Preliminary result)   Collection Time: 06/15/22  4:50 PM   Specimen: BLOOD LEFT ARM  Result Value Ref Range Status   Specimen Description BLOOD LEFT ARM  Final   Special Requests   Final    BOTTLES DRAWN AEROBIC AND ANAEROBIC Blood Culture results may not be optimal due to an excessive volume of blood received in culture bottles   Culture   Final    NO GROWTH < 24 HOURS Performed at [facility], [address] [place], [place] [address]    Report Status PENDING  Incomplete  Culture, blood (routine x 2)     Status: None (Preliminary result)   Collection Time: 06/15/22  6:57 PM   Specimen: BLOOD LEFT ARM  Result Value Ref Range Status   Specimen Description BLOOD LEFT ARM  Final   Special Requests   Final    BOTTLES DRAWN AEROBIC ONLY Blood Culture adequate volume   Culture   Final    NO GROWTH < 24 HOURS Performed at [facility], [address], [place], [place] [address]    Report Status PENDING  Incomplete  Aerobic/Anaerobic Culture w Gram Stain (surgical/deep wound)     Status: None (Preliminary result)   Collection Time: 06/16/22  1:59 PM   Specimen: Wound  Result Value Ref Range Status   Specimen Description WOUND LEFT ANKLE  Final   Special Requests PT ON ANCEF  Final   Gram Stain   Final    NO WBC SEEN RARE GRAM NEGATIVE RODS Performed at [facility], [address], [place], [place] [address]    Culture FEW PSEUDOMONAS AERUGINOSA  Final   Report Status PENDING  Incomplete  Aerobic/Anaerobic Culture w Gram Stain (surgical/deep wound)     Status: None (Preliminary result)   Collection Time: 06/16/22  1:59 PM   Specimen: Bone; Tissue  Result Value Ref Range Status   Specimen Description BONE  Final   Special Requests LEFT FIBULA  Final   Gram Stain   Final    FEW WBC PRESENT,BOTH PMN AND MONONUCLEAR RARE GRAM NEGATIVE RODS Performed at [facility]  Lab, [address] [place], [place] [address]    Culture FEW PSEUDOMONAS AERUGINOSA  Final   Report Status PENDING  Incomplete      Radiology Studies: No results found.     LOS: 1 day   Demarcus Thielke [employer] on [email or web address]  06/18/2022, 8:40 AM

## 2022-06-18 NOTE — Progress Notes (Signed)
Orthopedic Tech Progress Note Patient Details:  [name] Fernandez June 07, 1941 [id number]  Ortho Devices Type of Ortho Device: CAM walker Ortho Device/Splint Location: lle Ortho Device/Splint Interventions: Ordered, Application, Adjustment   Post Interventions Patient Tolerated: Well Instructions Provided: Care of device, Adjustment of device  Karolee Stamps 06/18/2022, 6:29 AM

## 2022-06-19 ENCOUNTER — Inpatient Hospital Stay: Payer: Self-pay

## 2022-06-19 DIAGNOSIS — M659 Synovitis and tenosynovitis, unspecified: Secondary | ICD-10-CM | POA: Diagnosis not present

## 2022-06-19 DIAGNOSIS — M8618 Other acute osteomyelitis, other site: Secondary | ICD-10-CM | POA: Diagnosis not present

## 2022-06-19 DIAGNOSIS — A498 Other bacterial infections of unspecified site: Secondary | ICD-10-CM

## 2022-06-19 DIAGNOSIS — D649 Anemia, unspecified: Secondary | ICD-10-CM | POA: Diagnosis not present

## 2022-06-19 DIAGNOSIS — I10 Essential (primary) hypertension: Secondary | ICD-10-CM | POA: Diagnosis not present

## 2022-06-19 LAB — BASIC METABOLIC PANEL
Anion gap: 6 (ref 5–15)
BUN: 13 mg/dL (ref 8–23)
CO2: 29 mmol/L (ref 22–32)
Calcium: 8.7 mg/dL — ABNORMAL LOW (ref 8.9–10.3)
Chloride: 99 mmol/L (ref 98–111)
Creatinine, Ser: 1.13 mg/dL (ref 0.61–1.24)
GFR, Estimated: >60 mL/min (ref >60)
Glucose, Bld: 105 mg/dL — ABNORMAL HIGH (ref 70–99)
Potassium: 4.2 mmol/L (ref 3.5–5.1)
Sodium: 134 mmol/L — ABNORMAL LOW (ref 135–145)

## 2022-06-19 LAB — MAGNESIUM: Magnesium: 1.8 mg/dL (ref 1.7–2.4)

## 2022-06-19 MED ORDER — SODIUM CHLORIDE 0.9% FLUSH: 10.0000 mL | INTRAVENOUS | Status: DC | PRN

## 2022-06-19 MED ORDER — SODIUM CHLORIDE 0.9% FLUSH
10.0000 mL | Freq: Two times a day (BID) | INTRAVENOUS | Status: DC
  Administered 2022-06-19 – 2022-06-20 (×3): 10 mL
  Administered 2022-06-21: 20 mL

## 2022-06-19 MED ORDER — OXYCODONE HCL 5 MG PO TABS
5.0000 mg | ORAL_TABLET | ORAL | Status: DC | PRN
  Administered 2022-06-19 – 2022-06-21 (×5): 10 mg via ORAL
  Filled 2022-06-19 (×5): qty 2

## 2022-06-19 MED ORDER — SODIUM CHLORIDE 0.9 % IV SOLN
2.0000 g | Freq: Two times a day (BID) | INTRAVENOUS | Status: DC
  Administered 2022-06-19 – 2022-06-21 (×4): 2 g via INTRAVENOUS
  Filled 2022-06-19 (×4): qty 12.5

## 2022-06-19 MED ORDER — CHLORHEXIDINE GLUCONATE CLOTH 2 % EX PADS
6.0000 | MEDICATED_PAD | Freq: Every day | CUTANEOUS | Status: DC
  Administered 2022-06-19 – 2022-06-20 (×2): 6 via TOPICAL

## 2022-06-19 MED ORDER — HYDROMORPHONE HCL 1 MG/ML IJ SOLN
0.5000 mg | INTRAMUSCULAR | Status: DC | PRN
  Administered 2022-06-19 – 2022-06-21 (×4): 0.5 mg via INTRAVENOUS
  Filled 2022-06-19 (×4): qty 0.5

## 2022-06-19 NOTE — TOC Initial Note (Signed)
Transition of Care [facility]) - Initial/Assessment Note    Patient Details  Name: [name] Fernandez MRN: [id number] Date of Birth: [birth year]  [facility] CM/SW Contact:    [name], RN Phone Number: 06/19/2022, 2:51 PM  Clinical Narrative:                 CM met with the patient at the bedside to discuss transitions of care needs - IV antibiotics for home for 6 weeks.  The patient lives with his wife, [name] Fernandez in the home.  ID MD met with the patient at the bedside.  PICC line was ordered for placement by the ID NP and is pending placement at this time.  I spoke with [name] Fernandez, [name] with [employer] and she will reach out to the wife for teaching regarding PICC line and IV antibiotic administration.  The patient's wife verbalizes that she is leaving the hospital at 4 pm today and may not visit at the bedside tomorrow if it is bad weather.  [name] Fernandez, [name] will reach out to the wife within the hour.  The patient was given Medicare choice regarding home health and he did not have a preference.  I called [name] Fernandez, [name] and he accepted the patient.  I asked [name] Fernandez, bedside RN order the [place] for home to be placed at the bedside.  The patient is ambulatory and has CAM walker and post-op shoe at the bedside to take home.  The patient's wife will be providing transportation to home by car - pending discharge date once IV antibiotics have been ordered and teaching for PICC line and IV antibiotics - PICC line pending placement as well.  Expected Discharge Plan: [place] Barriers to Discharge: Continued Medical Work up   Patient Goals and CMS Choice Patient states their goals for this hospitalization and ongoing recovery are:: To get better and go home CMS Medicare.gov Compare Post Acute Care list provided to:: Patient Choice offered to / list presented to : Patient, Spouse      Expected Discharge Plan and Services   Discharge  Planning Services: CM Consult Post Acute Care Choice: [place] arrangements for the past 2 months: Single Family Home                 DME Arranged:  (Bedside nursing to order preveena wound vac to bedside)         HH Arranged: RN [name] Agency: [name] [name] to provide [facility] RN, [facility] to provide home IV antibiotics/teaching) Date [place]: 06/19/22 Time [place]: [place] Representative spoke with at [place]: [name] Fernandez, [place] with [facility] HH  Prior Living Arrangements/Services Living arrangements for the past 2 months: [place] with:: Spouse Patient language and need for interpreter reviewed:: Yes Do you feel safe going back to the place where you live?: Yes      Need for Family Participation in Patient Care: Yes (Comment) Care giver support system in place?: Yes (comment) Current home services: Home RN, DME Criminal Activity/Legal Involvement Pertinent to Current Situation/Hospitalization: No - Comment as needed  Activities of Daily Living Home Assistive Devices/Equipment: Blood pressure cuff, Eyeglasses, Dentures (specify type) ADL Screening (condition at time of admission) Is the patient deaf or have difficulty hearing?: No Does the patient have difficulty seeing, even when wearing glasses/contacts?: No Does the patient have difficulty concentrating, remembering, or making decisions?: No Does the patient have difficulty dressing or  bathing?: No Does the patient have difficulty walking or climbing stairs?: Yes  Permission Sought/Granted Permission sought to share information with : Case Manager, Family Supports, Oceanographer granted to share information with : Yes, Verbal Permission Granted     Permission granted to share info w AGENCY: [facility] home health for RN [facility], [facility] for IV antibiotics  Permission granted to share info w Relationship: wife - [name] Fernandez - [phone]     Emotional  Assessment Appearance:: Appears stated age Attitude/Demeanor/Rapport: Gracious Affect (typically observed): Accepting Orientation: : Oriented to Self, Oriented to Place, Oriented to  Time, Oriented to Situation Alcohol / Substance Use: Not Applicable Psych Involvement: No (comment)  Admission diagnosis:  Tenosynovitis [M65.9] Wound infection [T14.8XXA, L08.9] Tenosynovitis of tibialis posterior tendon [M65.9] Patient Active Problem List   Diagnosis Date Noted   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) 06/16/2022   Osteomyelitis (HCC) 06/16/2022   Tenosynovitis of tibialis posterior tendon 06/15/2022   Wound infection 06/15/2022   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   Anemia 03/13/2019   Chronic venous insufficiency 03/12/2019   CKD (chronic kidney disease) stage 2, 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/2015   COPD (chronic obstructive pulmonary disease) (HCC) 12/08/2015   Chronic pain syndrome 05/26/2015   DDD (degenerative disc disease), lumbar 05/26/2015   Medication management 05/26/2015   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 AT [address] OF [facility] [address] [place] [address] Phone: [phone] Fax: [phone]     Social Determinants of Health (SDOH) Social History: SDOH Screenings   Food Insecurity: No Food Insecurity (06/15/2022)  Housing: Low Risk  (06/15/2022)  Transportation Needs: No Transportation Needs (06/15/2022)  Utilities: Not At Risk  (06/15/2022)  Depression (PHQ2-9): Low Risk  (03/19/2021)  Tobacco Use: Medium Risk (06/18/2022)   SDOH Interventions: Housing Interventions: Intervention Not Indicated   Readmission Risk Interventions    06/19/2022    2:50 PM  Readmission Risk Prevention Plan  Transportation Screening Complete  PCP or Specialist Appt within 5-7 Days Complete  Home Care Screening Complete  Medication Review (RN CM) Complete

## 2022-06-19 NOTE — Progress Notes (Signed)
Patient ID: [name] Fernandez, male   DOB: 1941-03-17, 82 y.o.   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  There is no additional draining in the wound VAC canister there is a good suction fit.  Patient will be discharged on a portable Praveena wound VAC pump.  Cultures are showing Pseudomonas anticipate patient will need prolonged antibiotic coverage.

## 2022-06-19 NOTE — Progress Notes (Signed)
Mobility Specialist Progress Note    06/19/22 1319  Mobility  Activity Ambulated with assistance in hallway  Level of Assistance Standby assist, set-up cues, supervision of patient - no hands on  Assistive Device Other (Comment) (IV pole)  Distance Ambulated (ft) 300 ft  LLE Weight Bearing WBAT  Activity Response Tolerated well  Mobility Referral Yes  $Mobility charge 1 Mobility   Pt received sitting EOB and agreeable. C/o pain being about 5-6/10.Marland Kitchen Returned to sitting EOB with call bell in reach.    Hildred Alamin Mobility Specialist  Please Psychologist, sport and exercise or Rehab Office at [phone]

## 2022-06-19 NOTE — Plan of Care (Signed)

## 2022-06-19 NOTE — Progress Notes (Signed)
CSW received notification from MD who states patient can be discharged home tomorrow with 6 weeks of IV antibiotics. Patient will also need a Prevena wound vac prior to discharge.  CSW spoke with [name] to inform her of the need for IV antibiotics at home. [name] to arrange for home health services through [place] as well.  Madilyn Fireman, MSW, LCSW Transitions of Care  Clinical Social Worker II [phone]

## 2022-06-19 NOTE — Progress Notes (Signed)
PHARMACY CONSULT NOTE FOR:  OUTPATIENT  PARENTERAL ANTIBIOTIC THERAPY (OPAT)  Indication: Tenosynovitis of tibialis posterior tendong 2/2 Pseudomonas aeruginosa Regimen: Cefepime 2g IV Q12H  End date: 07/28/22 (6w from 06/16/22)   IV antibiotic discharge orders are pended. To discharging provider:  please sign these orders via discharge navigator,  Select New Orders & click on the button choice - Manage This Unsigned Work.     Thank you for allowing pharmacy to be a part of this patient's care.  Adria Dill, PharmD PGY-2 Infectious Diseases Resident  06/19/2022 9:37 AM

## 2022-06-19 NOTE — Progress Notes (Signed)
[facility] PROGRESS NOTE   BROADUS COSTILLA BWG:[id number] DOB: 1941/04/29 DOA: 06/15/2022  PCP: Lucky Cowboy, MD  Brief History/Interval Summary: 82 y.o. male with medical history significant of GERD status post Nissen fundoplication, hypothyroidism, hypertension, hyperlipidemia, celiac disease, anemia, COPD, CKD 2, venous insufficiency, chronic pain presenting with worsening wound infection involving his left ankle area.  He has been on prolonged oral antibiotic regimen with levofloxacin without any improvement.  He is followed at the wound center.  He was sent to the emergency department.  He was hospitalized for further management.  Orthopedics consulted. MRI of the left ankle showed evidence of the ulceration with endosteal edema at the lateral malleolus suspicious for reactive inflammation, and not consistent with osteomyelitis.  Tenosynovitis of the common tibial tendon sheath also noted.    Consultants: Orthopedics, Dr. Lajoyce Corners  Procedures:  On 1/5: PROCEDURE:  IRRIGATION AND DEBRIDEMENT ANKLE Excision skin and soft tissue muscle fascia and partial excision of left distal fibula. Application of Kerecis tissue graft: 38 cm micro graft and 7 x 10 cm sheet. Application of cleanse choice wound VAC sponge x 1. Tissue sent for cultures and bone sent for culture separately.    Subjective/Interval History: Patient mentions that pain is well-controlled.  Did have a small bowel movement this morning.  Feels fatigued today.   Assessment/Plan:  Left ankle wound infection/tenosynovitis/Pseudomonas infection MRI was done.  Patient seen by orthopedics.  Underwent irrigation and debridement of the wound on 1/5.  See operative note for further details. Tissue samples and bone sample was sent for cultures.  Both are growing Pseudomonas.  ID will be consulted.  Orthopedics is of the opinion that patient will need prolonged antibiotic course.  Will wait for ID input. Currently on Zosyn. He  was on levofloxacin in the outpatient setting until about a week prior to admission. Pain is better controlled.  WBC remains normal.  He remains afebrile.   Per orthopedics he will go home with portable wound VAC.  Chronic kidney disease stage II/hyponatremia Stable.  Sodium level noted to be slightly low today.  Patient also feels fatigued.  Will hold his torsemide for now.  Essential hypertension Blood pressure has been well-controlled over the last 24 hours.   Noted to be on torsemide which is mainly for his LE edema. Not on any other anti-hypertensives.  Torsemide to be held as discussed above.  Normocytic anemia Drop in hemoglobin likely dilutional.  No overt bleeding noted.   Anemia panel reviewed.  Ferritin 21, iron 122, TIBC 384, percent saturation 32, vitamin B12 400, folate 14.6.  Hypothyroidism Continue levothyroxine.  DVT Prophylaxis: LOVENOX Code Status: Full code Family Communication: Discussed with patient Disposition Plan: Hopefully return home when improved.  Seen by PT.  No follow-up recommended.  Status is: Inpatient Remains inpatient appropriate because: Wound infection left ankle     Medications: Scheduled:  enoxaparin (LOVENOX) injection  40 mg Subcutaneous Q24H   pantoprazole  40 mg Oral Daily   polyethylene glycol  17 g Oral Daily   senna-docusate  2 tablet Oral BID   sodium chloride flush  3 mL Intravenous Q12H   torsemide  20 mg Oral q AM   Continuous:  sodium chloride     methocarbamol (ROBAXIN) IV     piperacillin-tazobactam (ZOSYN)  IV 3.375 g (06/19/22 0640)   VXB:[id number], bisacodyl, gabapentin, HYDROmorphone (DILAUDID) injection, magnesium citrate, methocarbamol **OR** methocarbamol (ROBAXIN) IV, metoCLOPramide **OR** metoCLOPramide (REGLAN) injection, ondansetron **OR** ondansetron (ZOFRAN) IV, oxyCODONE, oxyCODONE, traMADol  Antibiotics:  Anti-infectives (From admission, onward)    Start     Dose/Rate Route Frequency Ordered Stop    06/17/22 0600  ceFAZolin (ANCEF) IVPB 2g/100 mL premix        2 g 200 mL/hr over 30 Minutes Intravenous On call to O.R. 06/16/22 1249 06/16/22 1411   06/16/22 1259  ceFAZolin (ANCEF) 2-4 GM/100ML-% IVPB       Note to Pharmacy: Cameron Sprang M: cabinet override      06/16/22 1259 06/16/22 1347   06/15/22 1715  piperacillin-tazobactam (ZOSYN) IVPB 3.375 g        3.375 g 12.5 mL/hr over 240 Minutes Intravenous Every 8 hours 06/15/22 1702     06/15/22 1645  vancomycin (VANCOCIN) IVPB 1000 mg/200 mL premix  Status:  Discontinued        1,000 mg 200 mL/hr over 60 Minutes Intravenous  Once 06/15/22 1633 06/15/22 1652   06/15/22 1645  piperacillin-tazobactam (ZOSYN) IVPB 3.375 g  Status:  Discontinued        3.375 g 100 mL/hr over 30 Minutes Intravenous  Once 06/15/22 1633 06/15/22 1849       Objective:  Vital Signs  Vitals:   06/18/22 1537 06/18/22 1724 06/19/22 0636 06/19/22 0826  BP: 138/85  119/80 (!) 141/81  Pulse: 77  80 80  Resp: 16  18 16   Temp: 97.9 F (36.6 C)  98.5 F (36.9 C) 97.8 F (36.6 C)  TempSrc: Oral  Oral Oral  SpO2: 95%  94% 93%  Weight:  81.6 kg    Height:  5' 5.98" (1.676 m)     No intake or output data in the 24 hours ending 06/19/22 0846  Filed Weights   06/18/22 1724  Weight: 81.6 kg    General appearance: Awake alert.  In no distress Resp: Clear to auscultation bilaterally.  Normal effort Cardio: S1-S2 is normal regular.  No S3-S4.  No rubs murmurs or bruit GI: Abdomen is soft.  Nontender nondistended.  Bowel sounds are present normal.  No masses organomegaly Extremities: Both lower extremities covered by dressing.  Wound VAC on the left. Neurologic: Alert and oriented x3.  No focal neurological deficits.       Lab Results:  Data Reviewed: I have personally reviewed following labs and reports of the imaging studies  CBC: Recent Labs  Lab 06/14/22 1315 06/15/22 1305 06/16/22 0440 06/17/22 1033 06/18/22 0219  WBC 9.4 8.6 9.2 9.8  9.1  NEUTROABS 6.6 5.7  --   --   --   HGB 13.3 12.9* 11.5* 11.3* 10.6*  HCT 39.8 37.9* 33.6* 32.5* 31.4*  MCV 94.8 95.5 94.9 93.1 94.3  PLT 459* 429* 412* 356 344     Basic Metabolic Panel: Recent Labs  Lab 06/15/22 1305 06/16/22 0440 06/17/22 1033 06/18/22 0219 06/19/22 0645  NA 140 140 135 136 134*  K 4.4 4.3 4.1 3.6 4.2  CL 104 106 98 98 99  CO2 25 29 26 27 29   GLUCOSE 164* 111* 128* 102* 105*  BUN 16 16 15 15 13   CREATININE 1.03 0.94 1.17 1.19 1.13  CALCIUM 9.2 8.7* 8.6* 8.6* 8.7*  MG  --   --   --   --  1.8     GFR: Estimated Creatinine Clearance: 51.4 mL/min (by C-G formula based on SCr of 1.13 mg/dL).  Liver Function Tests: Recent Labs  Lab 06/14/22 1315 06/16/22 0440  AST 24 21  ALT 20 18  ALKPHOS 76 69  BILITOT 0.7 0.9  PROT  7.8 6.1*  ALBUMIN 3.8 2.9*      Recent Results (from the past 240 hour(s))  MRSA Next Gen by PCR, Nasal     Status: None   Collection Time: 06/14/22  8:02 PM   Specimen: Nasal Mucosa; Nasal Swab  Result Value Ref Range Status   MRSA by PCR Next Gen NOT DETECTED NOT DETECTED Final    Comment: (NOTE) The GeneXpert MRSA Assay (FDA approved for NASAL specimens only), is one component of a comprehensive MRSA colonization surveillance program. It is not intended to diagnose MRSA infection nor to guide or monitor treatment for MRSA infections. Test performance is not FDA approved in patients less than 32 years old. Performed at [facility], [address], [place], [place] [address]   Culture, blood (routine x 2)     Status: None (Preliminary result)   Collection Time: 06/15/22 12:49 PM   Specimen: BLOOD  Result Value Ref Range Status   Specimen Description BLOOD RIGHT ANTECUBITAL  Final   Special Requests   Final    AEROBIC BOTTLE ONLY Blood Culture results may not be optimal due to an inadequate volume of blood received in culture bottles   Culture   Final    NO GROWTH 4 DAYS Performed at [facility], [address] [place], [place] [address]    Report Status PENDING  Incomplete  Culture, blood (Routine X 2) w Reflex to ID Panel     Status: None (Preliminary result)   Collection Time: 06/15/22  4:50 PM   Specimen: BLOOD LEFT ARM  Result Value Ref Range Status   Specimen Description BLOOD LEFT ARM  Final   Special Requests   Final    BOTTLES DRAWN AEROBIC AND ANAEROBIC Blood Culture results may not be optimal due to an excessive volume of blood received in culture bottles   Culture   Final    NO GROWTH 4 DAYS Performed at [facility], [address] [place], [place] [address]    Report Status PENDING  Incomplete  Culture, blood (routine x 2)     Status: None (Preliminary result)   Collection Time: 06/15/22  6:57 PM   Specimen: BLOOD LEFT ARM  Result Value Ref Range Status   Specimen Description BLOOD LEFT ARM  Final   Special Requests   Final    BOTTLES DRAWN AEROBIC ONLY Blood Culture adequate volume   Culture   Final    NO GROWTH 4 DAYS Performed at [facility], [address], [place], [place] [address]    Report Status PENDING  Incomplete  Aerobic/Anaerobic Culture w Gram Stain (surgical/deep wound)     Status: None (Preliminary result)   Collection Time: 06/16/22  1:59 PM   Specimen: Wound  Result Value Ref Range Status   Specimen Description WOUND LEFT ANKLE  Final   Special Requests PT ON ANCEF  Final   Gram Stain   Final    NO WBC SEEN RARE GRAM NEGATIVE RODS Performed at [facility], [address], [place], [place] [address]    Culture   Final    FEW PSEUDOMONAS AERUGINOSA NO ANAEROBES ISOLATED; CULTURE IN PROGRESS FOR 5 DAYS    Report Status PENDING  Incomplete   Organism ID, Bacteria PSEUDOMONAS AERUGINOSA  Final      Susceptibility   Pseudomonas aeruginosa - MIC*    CEFTAZIDIME 4 SENSITIVE Sensitive     CIPROFLOXACIN >=4 RESISTANT Resistant  GENTAMICIN 4 SENSITIVE Sensitive     IMIPENEM 2 SENSITIVE Sensitive      PIP/TAZO <=4 SENSITIVE Sensitive     CEFEPIME 4 SENSITIVE Sensitive     * FEW PSEUDOMONAS AERUGINOSA  Aerobic/Anaerobic Culture w Gram Stain (surgical/deep wound)     Status: None (Preliminary result)   Collection Time: 06/16/22  1:59 PM   Specimen: Bone; Tissue  Result Value Ref Range Status   Specimen Description BONE  Final   Special Requests LEFT FIBULA  Final   Gram Stain   Final    FEW WBC PRESENT,BOTH PMN AND MONONUCLEAR RARE GRAM NEGATIVE RODS Performed at [facility], [address] [place], [place] [address]    Culture   Final    FEW PSEUDOMONAS AERUGINOSA NO ANAEROBES ISOLATED; CULTURE IN PROGRESS FOR 5 DAYS    Report Status PENDING  Incomplete   Organism ID, Bacteria PSEUDOMONAS AERUGINOSA  Final      Susceptibility   Pseudomonas aeruginosa - MIC*    CEFTAZIDIME 4 SENSITIVE Sensitive     CIPROFLOXACIN >=4 RESISTANT Resistant     GENTAMICIN 4 SENSITIVE Sensitive     IMIPENEM 1 SENSITIVE Sensitive     PIP/TAZO <=4 SENSITIVE Sensitive     CEFEPIME 4 SENSITIVE Sensitive     * FEW PSEUDOMONAS AERUGINOSA      Radiology Studies: No results found.     LOS: 2 days   Ronalda Walpole Rito Ehrlich  [facility] Pager on [email or web address]  06/19/2022, 8:46 AM

## 2022-06-19 NOTE — Progress Notes (Signed)
Peripherally Inserted Central Catheter Placement  The IV Nurse has discussed with the patient and/or persons authorized to consent for the patient, the purpose of this procedure and the potential benefits and risks involved with this procedure.  The benefits include less needle sticks, lab draws from the catheter, and the patient may be discharged home with the catheter. Risks include, but not limited to, infection, bleeding, blood clot (thrombus formation), and puncture of an artery; nerve damage and irregular heartbeat and possibility to perform a PICC exchange if needed/ordered by physician.  Alternatives to this procedure were also discussed.  Bard Power PICC patient education guide, fact sheet on infection prevention and patient information card has been provided to patient /or left at bedside.    PICC Placement Documentation  PICC Single Lumen 06/19/22 Right Brachial 34 cm 0 cm (Active)  Indication for Insertion or Continuance of Line Home intravenous therapies (PICC only) 06/19/22 1714  Exposed Catheter (cm) 0 cm 06/19/22 1714  Site Assessment Clean, Dry, Intact 06/19/22 1714  Line Status Flushed;Blood return noted;Saline locked 06/19/22 1714  Dressing Type Transparent 06/19/22 1714  Dressing Status Antimicrobial disc in place 06/19/22 [place] [employer] checked and tightened 06/19/22 1714  Dressing Change Due 06/26/22 06/19/22 1714       Scotty Court 06/19/2022, 5:15 PM

## 2022-06-19 NOTE — Consult Note (Signed)
[facility]    Date of Admission:  06/15/2022     Total days of antibiotics 6               Reason for Consult: Tenosynovitis    Referring Provider: Dr. Osvaldo Shipper Primary Care Provider: Lucky Cowboy, MD   ASSESSMENT:  [name] Fernandez is an 82 y/o caucasian male with recently worsened chronic left ankle wound refractory to outpatient wound care and antibiotics. MRI showing tenosynovitis and s/p I&D with fibular necrosis concerning for osteomyelitis and cultures positive for fluoroquinolone resistant Pseudomonas. Change piperacillin-tazobactam to cefepime which will be renally adjusted based on creatinine clearance. Recommend 6 weeks of antibiotics and will need PICC line as there are no oral options for treatment. OPAT/Home Health orders placed. Post-operative wound care be Dr. Lajoyce Corners and remaining medical and supportive care per Primary Team.   PLAN:  Change antibiotics to cefepime.  Post-operative wound care per Dr. Lajoyce Corners. Will need PICC line placement.  OPAT / Home Health orders Remaining medical and supportive care per Primary Team.   Diagnosis: Wound infection/osteomyelitis   Culture Result: Pseudomonas aeruginosa   Allergies  Allergen Reactions   Feraheme [Ferumoxytol] Swelling and Other (See Comments)    Swelling of lips and tongue, could not talk 30 minutes after the infusion.   Latex Other (See Comments)    Latex butterfly bandages tore off the skin and caused terrible blisters   Wound Dressing Adhesive Other (See Comments)    Latex butterfly bandages tore off the skin and caused terrible blisters   Levothyroxine Swelling and Other (See Comments)    Lip swelling   Doxycycline Nausea Only    OPAT Orders Discharge antibiotics to be given via PICC line Discharge antibiotics: Cefepime Per pharmacy protocol  Aim for Vancomycin trough 15-20 or AUC 400-550 (unless otherwise indicated) Duration: 6 weeks  End Date: 07/28/22  [facility]  Per Protocol:  Home health RN for IV administration and teaching; PICC line care and labs.    Labs weekly while on IV antibiotics: _X_ CBC with differential _X_ BMP __ CMP _X_ CRP _X_ ESR __ Vancomycin trough __ CK  _X_ Please pull PIC at completion of IV antibiotics __ Please leave PIC in place until doctor has seen patient or been notified  Fax weekly labs to [phone]  Clinic Follow Up Appt:  2:30 on 07/13/22 with Dr. 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 male with previous medical history of hypertension, celiac disease, hypothyroidism, and GERD s/p Nissen fundoplication, and venous insufficiency admitted with worsening ankle wound.   [name] Fernandez has been has  been followed by the wound care center for on-going chronic left ankle wound of at least 8 month duration that has been refractory to wound are and outpatient antibiotics having completed 2 courses of levofloxacin. On his last appointment with wound care on 06/14/22 noted to have a larger/expanding wound with recommendation for him to go to the ED. MRI left ankle showing ulceration and no drainable abscess with tenosynovitis of the common peroneus  tendon. No clear evidence of osteomyelitis. OR on 06/16/21 for I&D and wound VAC placements with findings of necrotic changes of the distal fibula which were resected and sent for cultures.  Surgical specimens with gram negative rods on gram stain and Pseudomonas aeruginosa on culture resistant to fluoroquinolones. POD #3 and has been afebrile with no further surgical interventions planned. Renal function is normal with CrCl of 51. On Day 6 of antibiotic therapy with piperacillin-tazobactam. ID has been asked to make antibiotic recommendations. [name] Fernandez currently lives at home with his wife and has some hesitation about possibly being sent home with IV antibiotics.    Review of Systems: Review of Systems  Constitutional:  Negative for chills, fever and weight loss.  Respiratory:  Negative for cough, shortness of breath and wheezing.   Cardiovascular:  Negative for chest pain and leg swelling.  Gastrointestinal:  Negative for abdominal pain, constipation, diarrhea, nausea and vomiting.  Skin:  Negative for rash.     Past Medical History:  Diagnosis Date   Anemia    Arthritis    Celiac disease    diagnoses 06/24/12   Esophageal stricture    H/O hiatal hernia    Hiatal hernia    Hypertension    Hypothyroidism    Prediabetes    Shortness of breath    from oxycodone at times   Vitamin B12 deficiency    Vitamin D deficiency     Social History   Tobacco Use   Smoking status: Former    Packs/day: 1.00    Years: 20.00    Total pack years: 20.00    Types: Cigarettes    Quit date: 06/27/1980    Years since quitting: 42.0    Passive exposure: Past   Smokeless tobacco: Never  Vaping Use   Vaping Use: Never used  Substance Use Topics   Alcohol use: No   Drug use: No    Family History  Problem Relation Age of Onset   Esophageal cancer Father     Allergies  Allergen Reactions   Feraheme [Ferumoxytol] Swelling and Other (See Comments)    Swelling of lips and tongue, could not  talk 30 minutes after the infusion.   Latex Other (See Comments)    Latex butterfly bandages tore off the skin and caused terrible blisters   Wound Dressing Adhesive Other (See Comments)    Latex butterfly bandages tore off the skin and caused terrible blisters   Levothyroxine Swelling and Other (See Comments)    Lip swelling   Doxycycline Nausea Only    OBJECTIVE: Blood pressure (!) 141/81, pulse 80, temperature 97.8 F (36.6 C), temperature source Oral, resp. rate 16, height 5' 5.98" (1.676 m), weight 81.6 kg, SpO2 93 %.  Physical Exam Constitutional:      General: He is not in acute distress.    Appearance: He is well-developed.  Cardiovascular:     Rate and Rhythm: Normal rate and regular rhythm.     Heart sounds: Normal heart sounds.  Pulmonary:     Effort: Pulmonary effort is  normal.     Breath sounds: Normal breath sounds.  Musculoskeletal:     Comments: Wound vac in place and functioning.   Skin:    General: Skin is warm and dry.  Neurological:     Mental Status: He is alert and oriented to person, place, and time.  Psychiatric:        Behavior: Behavior normal.        Thought Content: Thought content normal.        Judgment: Judgment normal.     Lab Results Lab Results  Component Value Date   WBC 9.1 06/18/2022   HGB 10.6 (L) 06/18/2022   HCT 31.4 (L) 06/18/2022   MCV 94.3 06/18/2022   PLT 344 06/18/2022    Lab Results  Component Value Date   CREATININE 1.13 06/19/2022   BUN 13 06/19/2022   NA 134 (L) 06/19/2022   K 4.2 06/19/2022   CL 99 06/19/2022   CO2 29 06/19/2022    Lab Results  Component Value Date   ALT 18 06/16/2022   AST 21 06/16/2022   ALKPHOS 69 06/16/2022   BILITOT 0.9 06/16/2022     Microbiology: Recent Results (from the past 240 hour(s))  MRSA Next Gen by PCR, Nasal     Status: None   Collection Time: 06/14/22  8:02 PM   Specimen: Nasal Mucosa; Nasal Swab  Result Value Ref Range Status   MRSA by PCR Next Gen NOT DETECTED NOT  DETECTED Final    Comment: (NOTE) The GeneXpert MRSA Assay (FDA approved for NASAL specimens only), is one component of a comprehensive MRSA colonization surveillance program. It is not intended to diagnose MRSA infection nor to guide or monitor treatment for MRSA infections. Test performance is not FDA approved in patients less than 88 years old. Performed at [facility], [address] [place], [place] [address]   Culture, blood (routine x 2)     Status: None (Preliminary result)   Collection Time: 06/15/22 12:49 PM   Specimen: BLOOD  Result Value Ref Range Status   Specimen Description BLOOD RIGHT ANTECUBITAL  Final   Special Requests   Final    AEROBIC BOTTLE ONLY Blood Culture results may not be optimal due to an inadequate volume of blood received in culture bottles   Culture   Final    NO GROWTH 4 DAYS Performed at [facility], [address] [place], [place] [address]    Report Status PENDING  Incomplete  Culture, blood (Routine X 2) w Reflex to ID Panel     Status: None (Preliminary result)   Collection Time: 06/15/22  4:50 PM   Specimen: BLOOD LEFT ARM  Result Value Ref Range Status   Specimen Description BLOOD LEFT ARM  Final   Special Requests   Final    BOTTLES DRAWN AEROBIC AND ANAEROBIC Blood Culture results may not be optimal due to an excessive volume of blood received in culture bottles   Culture   Final    NO GROWTH 4 DAYS Performed at [facility], [address], [place], [place] [address]    Report Status PENDING  Incomplete  Culture, blood (routine x 2)     Status: None (Preliminary result)   Collection Time: 06/15/22  6:57 PM   Specimen: BLOOD LEFT ARM  Result Value Ref Range Status   Specimen Description BLOOD LEFT ARM  Final   Special Requests   Final    BOTTLES DRAWN AEROBIC ONLY  Blood Culture adequate volume   Culture   Final    NO GROWTH 4 DAYS Performed at [place] [address], [place],  [place] [address]    Report Status PENDING  Incomplete  Aerobic/Anaerobic Culture w Gram Stain (surgical/deep wound)     Status: None (Preliminary result)   Collection Time: 06/16/22  1:59 PM   Specimen: Wound  Result Value Ref Range Status   Specimen Description WOUND LEFT ANKLE  Final   Special Requests PT ON ANCEF  Final   Gram Stain   Final    NO WBC SEEN RARE GRAM NEGATIVE RODS Performed at [facility], [place] [address] [place], [place] [address]    Culture   Final    FEW PSEUDOMONAS AERUGINOSA NO ANAEROBES ISOLATED; CULTURE IN PROGRESS FOR 5 DAYS    Report Status PENDING  Incomplete   Organism ID, Bacteria PSEUDOMONAS AERUGINOSA  Final      Susceptibility   Pseudomonas aeruginosa - MIC*    CEFTAZIDIME 4 SENSITIVE Sensitive     CIPROFLOXACIN >=4 RESISTANT Resistant     GENTAMICIN 4 SENSITIVE Sensitive     IMIPENEM 2 SENSITIVE Sensitive     PIP/TAZO <=4 SENSITIVE Sensitive     CEFEPIME 4 SENSITIVE Sensitive     * FEW PSEUDOMONAS AERUGINOSA  Aerobic/Anaerobic Culture w Gram Stain (surgical/deep wound)     Status: None (Preliminary result)   Collection Time: 06/16/22  1:59 PM   Specimen: Bone; Tissue  Result Value Ref Range Status   Specimen Description BONE  Final   Special Requests LEFT FIBULA  Final   Gram Stain   Final    FEW WBC PRESENT,BOTH PMN AND MONONUCLEAR RARE GRAM NEGATIVE RODS Performed at [facility], [place] [address] [place],  [address]    Culture   Final    FEW PSEUDOMONAS AERUGINOSA NO ANAEROBES ISOLATED; CULTURE IN PROGRESS FOR 5 DAYS    Report Status PENDING  Incomplete   Organism ID, Bacteria PSEUDOMONAS AERUGINOSA  Final      Susceptibility   Pseudomonas aeruginosa - MIC*    CEFTAZIDIME 4 SENSITIVE Sensitive     CIPROFLOXACIN >=4 RESISTANT Resistant     GENTAMICIN 4 SENSITIVE Sensitive     IMIPENEM 1 SENSITIVE Sensitive     PIP/TAZO <=4 SENSITIVE Sensitive     CEFEPIME 4 SENSITIVE Sensitive     * FEW PSEUDOMONAS AERUGINOSA      Terri Piedra, NP [facility]  06/19/2022  9:10 AM

## 2022-06-20 DIAGNOSIS — M659 Synovitis and tenosynovitis, unspecified: Secondary | ICD-10-CM | POA: Diagnosis not present

## 2022-06-20 LAB — CULTURE, BLOOD (ROUTINE X 2)
Culture: NO GROWTH
Culture: NO GROWTH
Culture: NO GROWTH
Special Requests: ADEQUATE

## 2022-06-20 LAB — BASIC METABOLIC PANEL
Anion gap: 12 (ref 5–15)
BUN: 15 mg/dL (ref 8–23)
CO2: 25 mmol/L (ref 22–32)
Calcium: 8.6 mg/dL — ABNORMAL LOW (ref 8.9–10.3)
Chloride: 97 mmol/L — ABNORMAL LOW (ref 98–111)
Creatinine, Ser: 1.16 mg/dL (ref 0.61–1.24)
GFR, Estimated: >60 mL/min (ref >60)
Glucose, Bld: 103 mg/dL — ABNORMAL HIGH (ref 70–99)
Potassium: 3.7 mmol/L (ref 3.5–5.1)
Sodium: 134 mmol/L — ABNORMAL LOW (ref 135–145)

## 2022-06-20 LAB — CBC
HCT: 31.8 % — ABNORMAL LOW (ref 39.0–52.0)
Hemoglobin: 11.2 g/dL — ABNORMAL LOW (ref 13.0–17.0)
MCH: 32.6 pg (ref 26.0–34.0)
MCHC: 35.2 g/dL (ref 30.0–36.0)
MCV: 92.4 fL (ref 80.0–100.0)
Platelets: 304 10*3/uL (ref 150–400)
RBC: 3.44 MIL/uL — ABNORMAL LOW (ref 4.22–5.81)
RDW: 14.6 % (ref 11.5–15.5)
WBC: 8.4 10*3/uL (ref 4.0–10.5)
nRBC: 0 % (ref 0.0–0.2)

## 2022-06-20 MED ORDER — POLYETHYLENE GLYCOL 3350 17 G PO PACK: 17.0000 g | PACK | Freq: Every day | ORAL | 0 refills | Status: DC

## 2022-06-20 MED ORDER — SENNOSIDES-DOCUSATE SODIUM 8.6-50 MG PO TABS: 2.0000 | ORAL_TABLET | Freq: Two times a day (BID) | ORAL | 0 refills | Status: DC

## 2022-06-20 MED ORDER — CEFEPIME IV (FOR PTA / DISCHARGE USE ONLY): 2.0000 g | Freq: Two times a day (BID) | INTRAVENOUS | 0 refills | Status: AC

## 2022-06-20 MED ORDER — OXYCODONE HCL 5 MG PO TABS: 5.0000 mg | ORAL_TABLET | Freq: Four times a day (QID) | ORAL | 0 refills | Status: DC | PRN

## 2022-06-20 NOTE — Discharge Summary (Signed)
[facility]  Physician Discharge Summary   Patient ID: [name] Fernandez MRN: [id number] DOB/AGE: 82/15/1942 82 y.o.  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 Lajoyce Corners to arrange outpatient follow-up   Home Health: Home health RN for antibiotic infusion Equipment/Devices: None  CODE STATUS: Full code  DISCHARGE CONDITION: fair  Diet recommendation: As before  INITIAL HISTORY: 82 y.o. male with medical history significant of GERD status post Nissen fundoplication, hypothyroidism, hypertension, hyperlipidemia, celiac disease, anemia, COPD, CKD 2, venous insufficiency, chronic pain presenting with worsening wound infection involving his left ankle area.  He has been on prolonged oral antibiotic regimen with levofloxacin without any improvement.  He is followed at the wound center.  He was sent to the emergency department.  He was hospitalized for further management.  Orthopedics consulted. MRI of the left ankle showed evidence of the ulceration with endosteal edema at the lateral malleolus suspicious for reactive inflammation, and not consistent with osteomyelitis.  Tenosynovitis of the common tibial tendon sheath also noted.     Consultants: Orthopedics, Dr. Lajoyce Corners.  Infectious disease   Procedures:  On 1/5: PROCEDURE:   IRRIGATION AND DEBRIDEMENT ANKLE Excision skin and soft tissue muscle fascia and partial excision of left distal fibula. Application of Kerecis tissue graft: 38 cm micro graft and 7 x 10 cm sheet. Application of cleanse choice wound VAC sponge x 1. Tissue sent for cultures and bone sent for culture separately.  PICC line placement    HOSPITAL COURSE:   Left ankle wound infection/tenosynovitis/Pseudomonas infection MRI was done.  Patient seen by orthopedics.  Underwent irrigation and debridement of the wound on 1/5.  See operative note for further details. Tissue samples and bone sample was sent for cultures.  Both are growing Pseudomonas. Patient seen by infectious disease.  Plan is for 6 weeks of cefepime.   Per orthopedics he will go home with portable wound VAC. PICC line has been placed.  Apparently there is some delay with PICC line teaching.  He may not be able to go home till tomorrow.   Chronic kidney disease stage II/hyponatremia Stable.  Sodium level is stable.  May resume torsemide at discharge.   Essential hypertension B elevated blood pressures initially were likely due to pain issues.  Now better controlled.  Noted to be on torsemide which is mainly for his LE edema. Not on any other anti-hypertensives.    Normocytic anemia Drop in hemoglobin likely dilutional.  No overt bleeding noted.   Anemia panel reviewed.  Ferritin 21, iron 122, TIBC 384, percent saturation 32, vitamin B12 400, folate 14.6.   Hypothyroidism Continue levothyroxine.  Patient is stable.  Okay for discharge home after PICC line teaching has been accomplished.   PERTINENT LABS:  The results of significant diagnostics from this hospitalization (including imaging, microbiology, ancillary and laboratory) are listed below for reference.  Microbiology: Recent Results (from the past 240 hour(s))  MRSA Next Gen by PCR, Nasal     Status: None   Collection Time: 06/14/22  8:02 PM   Specimen: Nasal  Mucosa; Nasal Swab  Result Value Ref Range Status   MRSA by PCR Next Gen NOT DETECTED NOT DETECTED Final    Comment: (NOTE) The GeneXpert MRSA Assay (FDA approved for NASAL specimens only), is one component of a comprehensive MRSA colonization surveillance program. It is not intended to diagnose MRSA infection nor to guide or monitor treatment for MRSA infections. Test performance is not FDA approved in patients less than 77 years old. Performed at [facility], [place] [address], [place], [place] [address]   Culture, blood (routine x 2)     Status: None   Collection Time: 06/15/22 12:49 PM   Specimen: BLOOD  Result Value Ref Range Status   Specimen Description BLOOD RIGHT ANTECUBITAL  Final   Special Requests   Final    AEROBIC BOTTLE ONLY Blood Culture results may not be optimal due to an inadequate volume of blood received in culture bottles   Culture   Final    NO GROWTH 5 DAYS Performed at [facility], [place] [address], [place], [place] [address]    Report Status 06/20/2022 FINAL  Final  Culture, blood (Routine X 2) w Reflex to ID Panel     Status: None   Collection Time: 06/15/22  4:50 PM   Specimen: BLOOD LEFT ARM  Result Value Ref Range Status   Specimen Description BLOOD LEFT ARM  Final   Special Requests   Final    BOTTLES DRAWN AEROBIC AND ANAEROBIC Blood Culture results may not be optimal due to an excessive volume of blood received in culture bottles   Culture   Final    NO GROWTH 5 DAYS Performed at [facility], [place] [name] [place], [place] [address]    Report Status 06/20/2022 FINAL  Final  Culture, blood (routine x 2)     Status: None   Collection Time: 06/15/22  6:57 PM   Specimen: BLOOD LEFT ARM  Result Value Ref Range Status   Specimen Description BLOOD LEFT ARM  Final   Special Requests   Final    BOTTLES DRAWN AEROBIC ONLY Blood Culture adequate volume   Culture   Final    NO GROWTH 5 DAYS Performed at [facility], [place] [address] [place], [place] [address]    Report Status 06/20/2022 FINAL  Final  Aerobic/Anaerobic Culture w Gram Stain (surgical/deep wound)     Status: None (Preliminary result)   Collection Time: 06/16/22  1:59 PM   Specimen: Wound  Result Value Ref Range Status   Specimen Description WOUND LEFT ANKLE  Final   Special Requests PT ON ANCEF  Final   Gram Stain   Final    NO WBC SEEN RARE GRAM NEGATIVE RODS Performed at [facility], [place] [address], [place], [place] [address]    Culture   Final    FEW PSEUDOMONAS AERUGINOSA NO ANAEROBES ISOLATED; CULTURE IN PROGRESS FOR 5 DAYS    Report Status PENDING  Incomplete   Organism ID, Bacteria PSEUDOMONAS AERUGINOSA  Final      Susceptibility   Pseudomonas aeruginosa - MIC*    CEFTAZIDIME 4 SENSITIVE Sensitive     CIPROFLOXACIN >=4 RESISTANT Resistant     GENTAMICIN 4 SENSITIVE Sensitive     IMIPENEM 2 SENSITIVE  Sensitive     PIP/TAZO <=4 SENSITIVE Sensitive     CEFEPIME 4 SENSITIVE Sensitive     * FEW PSEUDOMONAS AERUGINOSA  Aerobic/Anaerobic Culture w Gram Stain (surgical/deep wound)     Status: None (Preliminary result)   Collection Time: 06/16/22  1:59 PM   Specimen: Bone; Tissue  Result Value Ref Range Status   Specimen Description BONE  Final   Special Requests LEFT FIBULA  Final   Gram Stain   Final    FEW WBC PRESENT,BOTH PMN AND MONONUCLEAR RARE GRAM NEGATIVE RODS Performed at [facility], [place] [address], [place], [place] [address]    Culture   Final    FEW PSEUDOMONAS AERUGINOSA NO ANAEROBES ISOLATED; CULTURE IN PROGRESS FOR 5 DAYS    Report Status PENDING  Incomplete   Organism ID, Bacteria PSEUDOMONAS AERUGINOSA  Final      Susceptibility   Pseudomonas aeruginosa - MIC*    CEFTAZIDIME 4 SENSITIVE Sensitive     CIPROFLOXACIN >=4 RESISTANT Resistant     GENTAMICIN 4 SENSITIVE Sensitive     IMIPENEM 1 SENSITIVE Sensitive     PIP/TAZO <=4 SENSITIVE Sensitive     CEFEPIME 4 SENSITIVE  Sensitive     * FEW PSEUDOMONAS AERUGINOSA     Labs:   Basic Metabolic Panel: Recent Labs  Lab 06/16/22 0440 06/17/22 1033 06/18/22 0219 06/19/22 0645 06/20/22 0324  NA 140 135 136 134* 134*  K 4.3 4.1 3.6 4.2 3.7  CL 106 98 98 99 97*  CO2 29 26 27 29 25   GLUCOSE 111* 128* 102* 105* 103*  BUN 16 15 15 13 15   CREATININE 0.94 1.17 1.19 1.13 1.16  CALCIUM 8.7* 8.6* 8.6* 8.7* 8.6*  MG  --   --   --  1.8  --    Liver Function Tests: Recent Labs  Lab 06/14/22 1315 06/16/22 0440  AST 24 21  ALT 20 18  ALKPHOS 76 69  BILITOT 0.7 0.9  PROT 7.8 6.1*  ALBUMIN 3.8 2.9*    CBC: Recent Labs  Lab 06/14/22 1315 06/15/22 1305 06/16/22 0440 06/17/22 1033 06/18/22 0219 06/20/22 0324  WBC 9.4 8.6 9.2 9.8 9.1 8.4  NEUTROABS 6.6 5.7  --   --   --   --   HGB 13.3 12.9* 11.5* 11.3* 10.6* 11.2*  HCT 39.8 37.9* 33.6* 32.5* 31.4* 31.8*  MCV 94.8 95.5 94.9 93.1 94.3 92.4  PLT 459* 429* 412* 356 344 304     CBG: Recent Labs  Lab 06/16/22 1200 06/17/22 0752 06/17/22 1159 06/17/22 1606 06/18/22 0849  GLUCAP 83 107* 124* 93 100*     IMAGING STUDIES [place] EKG SITE RITE  Result Date: 06/19/2022 If Site Rite image not attached, placement could not be confirmed due to current cardiac rhythm.  MR ANKLE LEFT WO CONTRAST  Result Date: 06/14/2022 CLINICAL DATA:  Nonhealing wound along the lateral left ankle, discoloration of the skin. EXAM: MRI OF THE LEFT ANKLE WITHOUT CONTRAST TECHNIQUE: Multiplanar, multisequence MR imaging of the ankle was performed. No intravenous contrast was administered. COMPARISON:  Radiographs 01/14/2013 FINDINGS: TENDONS Peroneal: Common peroneus tendon sheath tenosynovitis. Posteromedial: Mild tibialis posterior tenosynovitis. Anterior: Unremarkable Achilles: Unremarkable Plantar Fascia: Mild thickening of the medial and lateral bands of the plantar fascia proximally. Plantar calcaneal spur noted. LIGAMENTS Lateral: Thin/attenuated ATFL probably from remote  injury. The ATFL does not currently appear overtly discontinuous. No surrounding synovitis. Medial: Unremarkable CARTILAGE Ankle Joint: Unremarkable Subtalar Joints/Sinus Tarsi: Unremarkable Bones: Subtle endosteal edema  along the lateral malleolus underlying the region of cutaneous irregularity. Other: Cutaneous thinning and irregularity superficial to the lateral malleolus and tracking along the anterolateral ankle region compatible with ulceration. No drainable abscess observed. IMPRESSION: 1. Cutaneous thinning and irregularity superficial to the lateral malleolus and tracking along the anterolateral ankle region compatible with ulceration. No drainable abscess observed. 2. Subtle endosteal edema along the lateral malleolus underlying the region of cutaneous irregularity, probably reactive to local inflammation and not of a magnitude to be specific for osteomyelitis at this time. 3. Common peroneus tendon sheath tenosynovitis. Mild tibialis posterior tenosynovitis. 4. Mild thickening of the medial and lateral bands of the plantar fascia proximally, with plantar calcaneal spur. 5. Thin/attenuated ATFL probably from remote injury. The ATFL does not currently appear overtly discontinuous. Electronically Signed   By: Van Clines M.D.   On: 06/14/2022 14:32    DISCHARGE EXAMINATION: Vitals:   06/19/22 0826 06/19/22 1546 06/19/22 2003 06/20/22 0440  BP: (!) 141/81 122/79 (!) 154/89 134/76  Pulse: 80 99 96 85  Resp: 16 16 16 16   Temp: 97.8 F (36.6 C) 98.5 F (36.9 C) 98.3 F (36.8 C) 99.1 F (37.3 C)  TempSrc: Oral Oral Oral Oral  SpO2: 93% 95% 96% 92%  Weight:      Height:       General appearance: Awake alert.  In no distress Resp: Clear to auscultation bilaterally.  Normal effort Cardio: S1-S2 is normal regular.  No S3-S4.  No rubs murmurs or bruit GI: Abdomen is soft.  Nontender nondistended.  Bowel sounds are present normal.  No masses organomegaly   DISPOSITION: Home  Discharge  Instructions     [facility] pharmacist to adjust dose for Vancomycin, Aminoglycosides and other anti-infective therapies as requested by physician.   Complete by: As directed    [facility] to provide Cath Flo 2mg    Complete by: As directed    Administer for PICC line occlusion and as ordered by physician for other access device issues.   Anaphylaxis Kit: Provided to treat any anaphylactic reaction to the medication being provided to the patient if First Dose or when requested by physician   Complete by: As directed    Epinephrine 1mg /ml vial / amp: Administer 0.3mg  (0.37ml) subcutaneously once for moderate to severe anaphylaxis, nurse to call physician and pharmacy when reaction occurs and call 911 if needed for immediate care   Diphenhydramine 50mg /ml IV vial: Administer 25-50mg  IV/IM PRN for first dose reaction, rash, itching, mild reaction, nurse to call physician and pharmacy when reaction occurs   Sodium Chloride 0.9% NS 547ml IV: Administer if needed for hypovolemic blood pressure drop or as ordered by physician after call to physician with anaphylactic reaction   Call MD for:  difficulty breathing, headache or visual disturbances   Complete by: As directed    Call MD for:  extreme fatigue   Complete by: As directed    Call MD for:  persistant dizziness or light-headedness   Complete by: As directed    Call MD for:  persistant nausea and vomiting   Complete by: As directed    Call MD for:  severe uncontrolled pain   Complete by: As directed    Call MD for:  temperature >100.4   Complete by: As directed    Change dressing on IV access line weekly and PRN   Complete by: As directed    Diet general   Complete by: As directed    Discharge instructions  Complete by: As directed    Please take your medications as prescribed.  Please be sure to follow-up with Dr. Sharol Given as he is recommended.  Call his office for any further issues with your left leg.  You were  cared for by a hospitalist during your hospital stay. If you have any questions about your discharge medications or the care you received while you were in the hospital after you are discharged, you can call the unit and asked to speak with the hospitalist on call if the hospitalist that took care of you is not available. Once you are discharged, your primary care physician will handle any further medical issues. Please note that NO REFILLS for any discharge medications will be authorized once you are discharged, as it is imperative that you return to your primary care physician (or establish a relationship with a primary care physician if you do not have one) for your aftercare needs so that they can reassess your need for medications and monitor your lab values. If you do not have a primary care physician, you can call [phone] for a physician referral.   Discharge wound care:   Complete by: As directed    Leave current dressing on as recommended by Dr. Sharol Given.   Flush IV access with Sodium Chloride 0.9% and Heparin 10 units/ml or 100 units/ml   Complete by: As directed    Home infusion instructions - [facility]   Complete by: As directed    Instructions: Flush IV access with Sodium Chloride 0.9% and Heparin 10units/ml or 100units/ml   Change dressing on IV access line: Weekly and PRN   Instructions Cath Flo 2mg : Administer for PICC Line occlusion and as ordered by physician for other access device   [facility] pharmacist to adjust dose for: Vancomycin, Aminoglycosides and other anti-infective therapies as requested by physician   Increase activity slowly   Complete by: As directed    Method of administration may be changed at the discretion of home infusion pharmacist based upon assessment of the patient and/or caregiver's ability to self-administer the medication ordered   Complete by: As directed    Negative Pressure Wound Therapy - Incisional   Complete by: As directed           Allergies as of 06/20/2022       Reactions   Feraheme [ferumoxytol] Swelling, Other (See Comments)   Swelling of lips and tongue, could not talk 30 minutes after the infusion.   Latex Other (See Comments)   Latex butterfly bandages tore off the skin and caused terrible blisters   Wound Dressing Adhesive Other (See Comments)   Latex butterfly bandages tore off the skin and caused terrible blisters   Levothyroxine Swelling, Other (See Comments)   Lip swelling   Doxycycline Nausea Only        Medication List     STOP taking these medications    clotrimazole-betamethasone cream Commonly known as: LOTRISONE   gentamicin cream 0.1 % Commonly known as: GARAMYCIN   Iron 325 (65 Fe) MG Tabs   Red Yeast Rice 600 MG Caps   senna 8.6 MG Tabs tablet Commonly known as: SENOKOT   triamcinolone cream 0.5 % Commonly known as: KENALOG       TAKE these medications    acetaminophen 500 MG tablet Commonly known as: TYLENOL Take 500-1,000 mg by mouth every 6 (six) hours as needed for mild pain or headache.   ceFEPime  IVPB Commonly known as: MAXIPIME  Inject 2 g into the vein every 12 (twelve) hours. Indication:  Tenosynovitis 2/2 Pseudomonas aeruginosa  First Dose: Yes Last Day of Therapy:  07/28/22  Labs - Once weekly:  CBC/D and BMP, Labs - Every other week:  ESR and CRP Method of administration: IV Push Method of administration may be changed at the discretion of home infusion pharmacist based upon assessment of the patient and/or caregiver's ability to self-administer the medication ordered.   fluticasone 50 MCG/ACT nasal spray Commonly known as: FLONASE Place 1-2 sprays into both nostrils daily as needed for allergies or rhinitis.   gabapentin 600 MG tablet Commonly known as: Neurontin Take 1/2 to 1 tablet 2 to 3 x /Daily as needed for Chronic Pain What changed:  how much to take how to take this when to take this additional instructions   hydrOXYzine  25 MG tablet Commonly known as: ATARAX Take  1 tablet  3 x /day  as needed  for Anxiety or Sleep What changed:  how much to take how to take this when to take this additional instructions   NON FORMULARY Take 1 capsule by mouth See admin instructions. Thyroid Care Plus capsules- Take 1 capsule by mouth once a day   omeprazole 20 MG tablet Commonly known as: PRILOSEC OTC Take 20 mg by mouth daily before breakfast.   oxyCODONE 5 MG immediate release tablet Commonly known as: Oxy IR/ROXICODONE Take 1 tablet (5 mg total) by mouth every 6 (six) hours as needed for severe pain.   polyethylene glycol 17 g packet Commonly known as: MIRALAX / GLYCOLAX Take 17 g by mouth daily.   senna-docusate 8.6-50 MG tablet Commonly known as: Senokot-S Take 2 tablets by mouth 2 (two) times daily.   torsemide 20 MG tablet Commonly known as: DEMADEX Take  1 tablet  Daily for Fluid Retention / Leg Swelling What changed:  how much to take how to take this when to take this additional instructions   vitamin C 1000 MG tablet Take 1,000 mg by mouth daily.   Vitamin D3 125 MCG (5000 UT) Caps Take 5,000 Units by mouth 2 (two) times daily.               Discharge Care Instructions  (From admission, onward)           Start     Ordered   06/20/22 0000  Change dressing on IV access line weekly and PRN  (Home infusion instructions - [facility] )        06/20/22 0851   06/20/22 0000  Discharge wound care:       Comments: Leave current dressing on as recommended by Dr. Lajoyce Cornersuda.   06/20/22 [id number]              Follow-up Information     Nadara Mustarduda, Marcus V, MD Follow up in 1 week(s).   Specialty: Orthopedic Surgery Contact information: [address] [place] [address] [phone]         [facility].   Specialty: Home Health Services Why: [facility] will be providing home health RN for PICC line care and follow up teaching in regards  to home IV antibiotics. Contact information: [address] [place] [address] [phone]         [facility] Follow up.   Why: [facility] will be providing home antibiotics.  Please call them regarding any problems with delivery of IV antibiotics - [phone]  TOTAL DISCHARGE TIME: 35 minutes  Aspasia Rude [employer] on [email or web address]  06/20/2022, 10:56 AM

## 2022-06-20 NOTE — Care Management Important Message (Signed)
Important Message  Patient Details  Name: [name] Fernandez MRN: [id number] Date of Birth: 1940/10/07   Medicare Important Message Given:  Yes     Orbie Pyo 06/20/2022, 2:06 PM

## 2022-06-20 NOTE — TOC Transition Note (Signed)
Transition of Care [facility]) - CM/SW Discharge Note   Patient Details  Name: [name] Fernandez MRN: [id number] Date of Birth: 1940-06-15  [facility]) CM/SW Contact:  [name], RN Phone Number: 06/20/2022, 11:21 AM   Clinical Narrative:    CM spoke with bedside nursing this morning and patient is upset that he was unable to go home today.  I met with the patient at the bedside and called the patient's wife on the phone while I was in the room.  The patient's wife states that she left early yesterday at 3 pm and was unable to meet with [name], [facility] yesterday since she is unable to drive in the dark.  The patient was pending placement of PICC yesterday and received placement yesterday evening.  I called the patient's wife and explained that patient is unable to return home for care until she receives proper teaching from [facility] for delivery of IV medications through the new PICC line.  The patient's wife is aware and states that she is not comfortable coming to the hospital today for teaching due to inclimate weather conditions.  The patient's wife was informed that [name], [place] with [facility] will reach out to her this morning to coordinate a teaching time tomorrow morning at the hospital.  [name] is aware.  I called the attending physician and he was updated that patient is unable to return to home today but will discharge home with wife tomorrow after teaching.  The patient is set up for home health through [facility] and [facility] for delivery of IV antibiotics.  The patient has CAM walker, postop shoe and prevena wound vac int he hospital room.  The patient will discharge home with the wife tomorrow by car once discharge teaching for IV antibiotics is completed.   Final next level of care: Home w Home Health Services Barriers to Discharge: Continued Medical Work up   Patient Goals and CMS Choice CMS Medicare.gov Compare Post Acute Care list provided  to:: Patient Choice offered to / list presented to : Patient, Spouse  Discharge Placement                         Discharge Plan and Services Additional resources added to the After Visit Summary for     Discharge Planning Services: CM Consult Post Acute Care Choice: Home Health          DME Arranged:  (Bedside nursing to order preveena wound vac to bedside)         HH Arranged: RN [place] Agency: [facility] [name] to provide [facility] RN, [facility] to provide home IV antibiotics/teaching) Date HH Agency Contacted: 06/19/22 Time [place]: [place] Representative spoke with at [place]: [name], [name] with [facility]  Social Determinants of Health (SDOH) Interventions [place]: No Food Insecurity (06/15/2022)  Housing: Low Risk  (06/15/2022)  Transportation Needs: No Transportation Needs (06/15/2022)  Utilities: Not At Risk (06/15/2022)  Depression (PHQ2-9): Low Risk  (03/19/2021)  Tobacco Use: Medium Risk (06/18/2022)     Readmission Risk Interventions    06/20/2022   11:21 AM 06/19/2022    2:50 PM  Readmission Risk Prevention Plan  Transportation Screening Complete Complete  PCP or Specialist Appt within 5-7 Days Complete Complete  Home Care Screening Complete Complete  Medication Review (RN CM) Complete Complete

## 2022-06-21 ENCOUNTER — Encounter (HOSPITAL_BASED_OUTPATIENT_CLINIC_OR_DEPARTMENT_OTHER): Payer: Medicare HMO | Admitting: Physician Assistant

## 2022-06-21 DIAGNOSIS — M86262 Subacute osteomyelitis, left tibia and fibula: Secondary | ICD-10-CM | POA: Diagnosis not present

## 2022-06-21 LAB — AEROBIC/ANAEROBIC CULTURE W GRAM STAIN (SURGICAL/DEEP WOUND): Gram Stain: NONE SEEN

## 2022-06-21 LAB — BASIC METABOLIC PANEL
Anion gap: 9 (ref 5–15)
BUN: 12 mg/dL (ref 8–23)
CO2: 24 mmol/L (ref 22–32)
Calcium: 8.5 mg/dL — ABNORMAL LOW (ref 8.9–10.3)
Chloride: 101 mmol/L (ref 98–111)
Creatinine, Ser: 1 mg/dL (ref 0.61–1.24)
GFR, Estimated: >60 mL/min (ref >60)
Glucose, Bld: 77 mg/dL (ref 70–99)
Potassium: 3.6 mmol/L (ref 3.5–5.1)
Sodium: 134 mmol/L — ABNORMAL LOW (ref 135–145)

## 2022-06-21 NOTE — Progress Notes (Signed)
Patient could not be discharged yesterday as PICC line teaching could not be performed.  Patient's wife to come in today for infusion teaching today.  He remains stable for discharge.  Did not have any new questions for me.  He has been ambulating in the room without difficulty. Please review discharge summary from yesterday for details.  Bonnielee Haff 06/21/2022

## 2022-06-22 DIAGNOSIS — I129 Hypertensive chronic kidney disease with stage 1 through stage 4 chronic kidney disease, or unspecified chronic kidney disease: Secondary | ICD-10-CM | POA: Diagnosis not present

## 2022-06-22 DIAGNOSIS — E1122 Type 2 diabetes mellitus with diabetic chronic kidney disease: Secondary | ICD-10-CM | POA: Diagnosis not present

## 2022-06-22 DIAGNOSIS — N182 Chronic kidney disease, stage 2 (mild): Secondary | ICD-10-CM | POA: Diagnosis not present

## 2022-06-22 DIAGNOSIS — M65172 Other infective (teno)synovitis, left ankle and foot: Secondary | ICD-10-CM | POA: Diagnosis not present

## 2022-06-22 DIAGNOSIS — E11622 Type 2 diabetes mellitus with other skin ulcer: Secondary | ICD-10-CM | POA: Diagnosis not present

## 2022-06-22 DIAGNOSIS — E1169 Type 2 diabetes mellitus with other specified complication: Secondary | ICD-10-CM | POA: Diagnosis not present

## 2022-06-22 DIAGNOSIS — B965 Pseudomonas (aeruginosa) (mallei) (pseudomallei) as the cause of diseases classified elsewhere: Secondary | ICD-10-CM | POA: Diagnosis not present

## 2022-06-22 DIAGNOSIS — L97329 Non-pressure chronic ulcer of left ankle with unspecified severity: Secondary | ICD-10-CM | POA: Diagnosis not present

## 2022-06-22 DIAGNOSIS — M86662 Other chronic osteomyelitis, left tibia and fibula: Secondary | ICD-10-CM | POA: Diagnosis not present

## 2022-06-23 DIAGNOSIS — L97329 Non-pressure chronic ulcer of left ankle with unspecified severity: Secondary | ICD-10-CM | POA: Diagnosis not present

## 2022-06-23 DIAGNOSIS — E1122 Type 2 diabetes mellitus with diabetic chronic kidney disease: Secondary | ICD-10-CM | POA: Diagnosis not present

## 2022-06-23 DIAGNOSIS — M65172 Other infective (teno)synovitis, left ankle and foot: Secondary | ICD-10-CM | POA: Diagnosis not present

## 2022-06-23 DIAGNOSIS — I129 Hypertensive chronic kidney disease with stage 1 through stage 4 chronic kidney disease, or unspecified chronic kidney disease: Secondary | ICD-10-CM | POA: Diagnosis not present

## 2022-06-23 DIAGNOSIS — B965 Pseudomonas (aeruginosa) (mallei) (pseudomallei) as the cause of diseases classified elsewhere: Secondary | ICD-10-CM | POA: Diagnosis not present

## 2022-06-23 DIAGNOSIS — E11622 Type 2 diabetes mellitus with other skin ulcer: Secondary | ICD-10-CM | POA: Diagnosis not present

## 2022-06-23 DIAGNOSIS — M86662 Other chronic osteomyelitis, left tibia and fibula: Secondary | ICD-10-CM | POA: Diagnosis not present

## 2022-06-23 DIAGNOSIS — N182 Chronic kidney disease, stage 2 (mild): Secondary | ICD-10-CM | POA: Diagnosis not present

## 2022-06-23 DIAGNOSIS — E1169 Type 2 diabetes mellitus with other specified complication: Secondary | ICD-10-CM | POA: Diagnosis not present

## 2022-06-26 ENCOUNTER — Telehealth: Payer: Self-pay | Admitting: Orthopedic Surgery

## 2022-06-26 ENCOUNTER — Ambulatory Visit: Payer: Medicare HMO

## 2022-06-26 DIAGNOSIS — L089 Local infection of the skin and subcutaneous tissue, unspecified: Secondary | ICD-10-CM

## 2022-06-26 NOTE — Telephone Encounter (Signed)
Called pt and he will come in today at 10 am to have wound vac removed.

## 2022-06-26 NOTE — Telephone Encounter (Signed)
Patients wife states patient needs to be seen due to wound vac is 80% full and she says is beeping. She would like someone to call and advise..[phone]

## 2022-06-26 NOTE — Progress Notes (Signed)
The pt called this morning advising that his wound vac had been alarming since Saturday and that he was advised to call for appt today by on call doctor. Patient is s/p irrigation and debridement of the left ankle with application of Kerecis graft on 06/16/2022. The wound vac canister had 125 ml serosanguinous drainage. Wound vac was removed and photo obtained for chart. Some of the graft has not incorporated but there is no redness, no swelling. A dry dressing was applied to the area and an ace wrapped from the base of the toe to the bend of the knee. Supplies were given to the patient as well and he will change this daily. Patient and his wife both voiced understanding and are please with the plan. They will follow up in the office next Monday with Dr. Sharol Given or sooner should they need to . Will call with any questions or concerns.    Kyrstan Gotwalt, [place], [employer]

## 2022-06-28 ENCOUNTER — Encounter: Payer: Medicare HMO | Admitting: Family

## 2022-06-29 ENCOUNTER — Telehealth: Payer: Self-pay | Admitting: Orthopedic Surgery

## 2022-06-29 ENCOUNTER — Telehealth: Payer: Self-pay

## 2022-06-29 DIAGNOSIS — N182 Chronic kidney disease, stage 2 (mild): Secondary | ICD-10-CM | POA: Diagnosis not present

## 2022-06-29 DIAGNOSIS — L97329 Non-pressure chronic ulcer of left ankle with unspecified severity: Secondary | ICD-10-CM | POA: Diagnosis not present

## 2022-06-29 DIAGNOSIS — E1169 Type 2 diabetes mellitus with other specified complication: Secondary | ICD-10-CM | POA: Diagnosis not present

## 2022-06-29 DIAGNOSIS — B965 Pseudomonas (aeruginosa) (mallei) (pseudomallei) as the cause of diseases classified elsewhere: Secondary | ICD-10-CM | POA: Diagnosis not present

## 2022-06-29 DIAGNOSIS — M86662 Other chronic osteomyelitis, left tibia and fibula: Secondary | ICD-10-CM | POA: Diagnosis not present

## 2022-06-29 DIAGNOSIS — M65172 Other infective (teno)synovitis, left ankle and foot: Secondary | ICD-10-CM | POA: Diagnosis not present

## 2022-06-29 DIAGNOSIS — Z452 Encounter for adjustment and management of vascular access device: Secondary | ICD-10-CM | POA: Diagnosis not present

## 2022-06-29 DIAGNOSIS — E1122 Type 2 diabetes mellitus with diabetic chronic kidney disease: Secondary | ICD-10-CM | POA: Diagnosis not present

## 2022-06-29 DIAGNOSIS — E11622 Type 2 diabetes mellitus with other skin ulcer: Secondary | ICD-10-CM | POA: Diagnosis not present

## 2022-06-29 DIAGNOSIS — I129 Hypertensive chronic kidney disease with stage 1 through stage 4 chronic kidney disease, or unspecified chronic kidney disease: Secondary | ICD-10-CM | POA: Diagnosis not present

## 2022-06-29 NOTE — Telephone Encounter (Signed)
[facility] nurse calling in stating she had questions about wound please call [name] [phone]

## 2022-06-29 NOTE — Telephone Encounter (Signed)
Tried calling to discuss. No answer, went straight to VM LMVM to call us back to discuss. Need additional information

## 2022-06-29 NOTE — Telephone Encounter (Signed)
Patient states that he had his wound vac removed Monday with [facility]. He said that he has been changing his bandage each day, but the gauze sticks. He wants to know if he can apply the triamcinoline cream 1% to the area before putting the gauze on to keep it from sticking. Please advise and I can call patient back.  [phone]

## 2022-06-29 NOTE — Telephone Encounter (Signed)
Called and relayed information to patient.

## 2022-06-30 ENCOUNTER — Telehealth: Payer: Self-pay | Admitting: Orthopedic Surgery

## 2022-06-30 DIAGNOSIS — M86262 Subacute osteomyelitis, left tibia and fibula: Secondary | ICD-10-CM | POA: Diagnosis not present

## 2022-06-30 NOTE — Telephone Encounter (Signed)
[facility] nurse said she missed a call about patient today..[phone]

## 2022-06-30 NOTE — Telephone Encounter (Signed)
FYI I called and talked to wound care nurse [name]. She stated pt is refusing her care. He is scheduled to see you on Monday

## 2022-07-03 ENCOUNTER — Encounter: Payer: Self-pay | Admitting: Orthopedic Surgery

## 2022-07-03 ENCOUNTER — Ambulatory Visit (INDEPENDENT_AMBULATORY_CARE_PROVIDER_SITE_OTHER): Payer: Medicare HMO | Admitting: Orthopedic Surgery

## 2022-07-03 DIAGNOSIS — S91002A Unspecified open wound, left ankle, initial encounter: Secondary | ICD-10-CM

## 2022-07-03 MED ORDER — SILVER SULFADIAZINE 1 % EX CREA: 1.0000 | TOPICAL_CREAM | Freq: Every day | CUTANEOUS | 3 refills | Status: DC

## 2022-07-05 ENCOUNTER — Telehealth: Payer: Self-pay | Admitting: Orthopedic Surgery

## 2022-07-05 NOTE — Telephone Encounter (Signed)
[name] Fernandez calling to confirm that patient will be doing his own wound care. They advised they spoke to someone lase week and no one returned their call [phone] [name])

## 2022-07-06 DIAGNOSIS — N182 Chronic kidney disease, stage 2 (mild): Secondary | ICD-10-CM | POA: Diagnosis not present

## 2022-07-06 DIAGNOSIS — Z452 Encounter for adjustment and management of vascular access device: Secondary | ICD-10-CM | POA: Diagnosis not present

## 2022-07-06 DIAGNOSIS — L97329 Non-pressure chronic ulcer of left ankle with unspecified severity: Secondary | ICD-10-CM | POA: Diagnosis not present

## 2022-07-06 DIAGNOSIS — M86662 Other chronic osteomyelitis, left tibia and fibula: Secondary | ICD-10-CM | POA: Diagnosis not present

## 2022-07-06 DIAGNOSIS — E1169 Type 2 diabetes mellitus with other specified complication: Secondary | ICD-10-CM | POA: Diagnosis not present

## 2022-07-06 DIAGNOSIS — I129 Hypertensive chronic kidney disease with stage 1 through stage 4 chronic kidney disease, or unspecified chronic kidney disease: Secondary | ICD-10-CM | POA: Diagnosis not present

## 2022-07-06 DIAGNOSIS — B965 Pseudomonas (aeruginosa) (mallei) (pseudomallei) as the cause of diseases classified elsewhere: Secondary | ICD-10-CM | POA: Diagnosis not present

## 2022-07-06 DIAGNOSIS — M65172 Other infective (teno)synovitis, left ankle and foot: Secondary | ICD-10-CM | POA: Diagnosis not present

## 2022-07-06 DIAGNOSIS — E1122 Type 2 diabetes mellitus with diabetic chronic kidney disease: Secondary | ICD-10-CM | POA: Diagnosis not present

## 2022-07-06 DIAGNOSIS — E11622 Type 2 diabetes mellitus with other skin ulcer: Secondary | ICD-10-CM | POA: Diagnosis not present

## 2022-07-07 DIAGNOSIS — M86262 Subacute osteomyelitis, left tibia and fibula: Secondary | ICD-10-CM | POA: Diagnosis not present

## 2022-07-11 ENCOUNTER — Ambulatory Visit: Payer: Medicare HMO | Admitting: Nurse Practitioner

## 2022-07-11 ENCOUNTER — Encounter: Payer: Self-pay | Admitting: Orthopedic Surgery

## 2022-07-11 NOTE — Progress Notes (Signed)
Office Visit Note   Patient: [name] Fernandez           Date of Birth: 08/06/1940           MRN: [id number] Visit Date: 07/03/2022              Requested by: Unk Pinto, [place],  [place] [address] PCP: Unk Pinto, MD  Chief Complaint  Patient presents with   Left Ankle - Follow-up    I&D left ankle 06/16/2022      HPI: Patient is an 82 year old gentleman is status post irrigation debridement left ankle wound on January 5.  Patient states he is on antibiotics using Neurontin as needed.  Patient has a PICC line in place.  Assessment & Plan: Visit Diagnoses:  1. Ankle wound, left, initial encounter     Plan: Dial soap cleansing Silvadene dressing changes follow-up in 2 weeks.  Evaluate for repeat debridement and tissue graft.  Follow-Up Instructions: Return in about 2 weeks (around 07/17/2022).   Ortho Exam  Patient is alert, oriented, no adenopathy, well-dressed, normal affect, normal respiratory effort. Examination the wound measures 5 x 10 cm there is 75% granulation tissue.  Imaging: No results found. No images are attached to the encounter.  Labs: Lab Results  Component Value Date   HGBA1C 6.4 (H) 04/04/2022   HGBA1C 5.9 (H) 09/14/2021   HGBA1C 5.8 (H) 03/16/2021   ESRSEDRATE 32 (H) 06/15/2022   ESRSEDRATE 40 (H) 06/14/2022   CRP 0.8 06/15/2022   CRP <0.5 06/14/2022   LABURIC 7.8 05/20/2014   LABURIC 6.8 05/20/2013   REPTSTATUS 06/21/2022 FINAL 06/16/2022   REPTSTATUS 06/21/2022 FINAL 06/16/2022   GRAMSTAIN NO WBC SEEN RARE GRAM NEGATIVE RODS  06/16/2022   GRAMSTAIN  06/16/2022    FEW WBC PRESENT,BOTH PMN AND MONONUCLEAR RARE GRAM NEGATIVE RODS    CULT  06/16/2022    FEW PSEUDOMONAS AERUGINOSA NO ANAEROBES ISOLATED Performed at [facility], [place] [address] [place], [place] [address]    CULT  06/16/2022    FEW PSEUDOMONAS AERUGINOSA NO ANAEROBES ISOLATED Performed at [facility], [place]  [address], [place], [place] [address]    LABORGA PSEUDOMONAS AERUGINOSA 06/16/2022   LABORGA PSEUDOMONAS AERUGINOSA 06/16/2022     Lab Results  Component Value Date   ALBUMIN 2.9 (L) 06/16/2022   ALBUMIN 3.8 06/14/2022   ALBUMIN 4.0 09/25/2016   PREALBUMIN 18 06/14/2022    Lab Results  Component Value Date   MG 1.8 06/19/2022   MG 1.9 04/04/2022   MG 1.9 03/16/2021   Lab Results  Component Value Date   VD25OH 59 04/04/2022   VD25OH 44 09/28/2020   VD25OH 35 07/24/2019    Lab Results  Component Value Date   PREALBUMIN 18 06/14/2022      Latest Ref Rng & Units 06/20/2022    3:24 AM 06/18/2022    2:19 AM 06/17/2022   10:33 AM  CBC EXTENDED  WBC 4.0 - 10.5 K/uL 8.4  9.1  9.8   RBC 4.22 - 5.81 MIL/uL 3.44  3.33  3.43    3.49   Hemoglobin 13.0 - 17.0 g/dL 11.2  10.6  11.3   HCT 39.0 - 52.0 % 31.8  31.4  32.5   Platelets 150 - 400 K/uL 304  344  356      There is no height or weight on file to calculate BMI.  Orders:  No orders of the defined types were  placed in this encounter.  Meds ordered this encounter  Medications   silver sulfADIAZINE (SILVADENE) 1 % cream    Sig: Apply 1 Application topically daily. Apply to affected area daily plus dry dressing    Dispense:  400 g    Refill:  3     Procedures: No procedures performed  Clinical Data: No additional findings.  ROS:  All other systems negative, except as noted in the HPI. Review of Systems  Objective: Vital Signs: There were no vitals taken for this visit.  Specialty Comments:  No specialty comments available.  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[place]) 06/16/2022   Osteomyelitis ([place]) 06/16/2022   Tenosynovitis of tibialis posterior tendon 06/15/2022   Wound infection 06/15/2022   Chronic ulcer of left ankle ([place]) 06/14/2022   Anemia 03/13/2019   Chronic venous insufficiency 03/12/2019   CKD (chronic kidney disease)  stage 2, 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/2015   COPD (chronic obstructive pulmonary disease) ([place]) 12/08/2015   Chronic pain syndrome 05/26/2015   DDD (degenerative disc disease), lumbar 05/26/2015   Medication management 05/26/2015   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   Lumbago with sciatica 08/06/2014   Hyperlipidemia 08/20/2013   Hypertension    Abnormal glucose    Vitamin D deficiency    Arthritis    Vitamin B12 deficiency    Celiac disease 06/19/2012   GERD (gastroesophageal reflux disease) 04/30/2012   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 tube) procedure 04/30/2012   Hypothyroidism 04/30/2012   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   Past Medical History:  Diagnosis Date   Anemia    Arthritis    Celiac disease    diagnoses 06/24/12   Esophageal stricture    H/O hiatal hernia    Hiatal hernia    Hypertension    Hypothyroidism    Prediabetes    Shortness of breath    from oxycodone at times   Vitamin B12 deficiency    Vitamin D deficiency     Family History  Problem Relation Age of Onset   Esophageal cancer Father     Past Surgical History:  Procedure Laterality Date   APPENDECTOMY     BACK SURGERY     3 diff surgeries for vertebrea broken   EYE SURGERY     bilateral cataract surgery   HERNIA REPAIR  02/10/1949   bilateral groin as child   HIATAL HERNIA REPAIR     I & D EXTREMITY Left 06/16/2022   Procedure: IRRIGATION AND DEBRIDEMENT ANKLE;  Surgeon: Newt Minion, MD;  Location: [facility];  Service: Orthopedics;  Laterality: Left;   JOINT REPLACEMENT  04/12/2010   right knee replacement   TONSILLECTOMY     as child   TOTAL KNEE REVISION  11/20/2011   Procedure: TOTAL KNEE REVISION;  Surgeon: Mauri Pole, MD;  Location: [facility];  Service: Orthopedics;  Laterality: Right;  Right Total Knee Revision   Social History   Occupational History   Occupation: Retired    Fish farm manager: [employer]  Tobacco Use   Smoking status: Former    Packs/day: 1.00     Years: 20.00    Total pack years: 20.00    Types: Cigarettes    Quit date: 06/27/1980    Years since quitting: 42.0    Passive exposure: Past   Smokeless tobacco: Never  Vaping Use   Vaping Use: Never used  Substance and Sexual  Activity   Alcohol use: No   Drug use: No   Sexual activity: Yes

## 2022-07-13 ENCOUNTER — Telehealth: Payer: Self-pay

## 2022-07-13 ENCOUNTER — Encounter: Payer: Self-pay | Admitting: Internal Medicine

## 2022-07-13 ENCOUNTER — Other Ambulatory Visit: Payer: Self-pay

## 2022-07-13 ENCOUNTER — Ambulatory Visit (INDEPENDENT_AMBULATORY_CARE_PROVIDER_SITE_OTHER): Payer: Medicare HMO | Admitting: Internal Medicine

## 2022-07-13 VITALS — BP 144/82 | HR 66 | Temp 97.6°F | Resp 16 | Wt 157.8 lb

## 2022-07-13 DIAGNOSIS — L97909 Non-pressure chronic ulcer of unspecified part of unspecified lower leg with unspecified severity: Secondary | ICD-10-CM | POA: Diagnosis not present

## 2022-07-13 DIAGNOSIS — L97329 Non-pressure chronic ulcer of left ankle with unspecified severity: Secondary | ICD-10-CM | POA: Diagnosis not present

## 2022-07-13 DIAGNOSIS — I129 Hypertensive chronic kidney disease with stage 1 through stage 4 chronic kidney disease, or unspecified chronic kidney disease: Secondary | ICD-10-CM | POA: Diagnosis not present

## 2022-07-13 DIAGNOSIS — E1169 Type 2 diabetes mellitus with other specified complication: Secondary | ICD-10-CM | POA: Diagnosis not present

## 2022-07-13 DIAGNOSIS — M65172 Other infective (teno)synovitis, left ankle and foot: Secondary | ICD-10-CM | POA: Diagnosis not present

## 2022-07-13 DIAGNOSIS — M8660 Other chronic osteomyelitis, unspecified site: Secondary | ICD-10-CM | POA: Diagnosis not present

## 2022-07-13 DIAGNOSIS — E1122 Type 2 diabetes mellitus with diabetic chronic kidney disease: Secondary | ICD-10-CM | POA: Diagnosis not present

## 2022-07-13 DIAGNOSIS — M86662 Other chronic osteomyelitis, left tibia and fibula: Secondary | ICD-10-CM | POA: Diagnosis not present

## 2022-07-13 DIAGNOSIS — I83009 Varicose veins of unspecified lower extremity with ulcer of unspecified site: Secondary | ICD-10-CM | POA: Diagnosis not present

## 2022-07-13 DIAGNOSIS — E11622 Type 2 diabetes mellitus with other skin ulcer: Secondary | ICD-10-CM | POA: Diagnosis not present

## 2022-07-13 DIAGNOSIS — B965 Pseudomonas (aeruginosa) (mallei) (pseudomallei) as the cause of diseases classified elsewhere: Secondary | ICD-10-CM | POA: Diagnosis not present

## 2022-07-13 DIAGNOSIS — N182 Chronic kidney disease, stage 2 (mild): Secondary | ICD-10-CM | POA: Diagnosis not present

## 2022-07-13 NOTE — Telephone Encounter (Signed)
Thank you :)

## 2022-07-13 NOTE — Patient Instructions (Signed)
You will be done with cefepime antibiotics on 2/16  Leave your picc in place until you see me again end of 07/2022 or beginning of 08/2022  If you have worsening rash let me know  It is ok to use over the counter calamine lotion on your arm

## 2022-07-13 NOTE — Telephone Encounter (Signed)
Per Dr.Vu - patient will stop IV cefepime on 07/28/2022 but patient will need to keep PICC line in until follow up appointment with Dr.Vu around the end of February.   Message sent to [name], RN and [facility] staff.   [place], CMA

## 2022-07-13 NOTE — Progress Notes (Signed)
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 ([place]) 06/16/2022   Osteomyelitis ([place]) 06/16/2022   Tenosynovitis of tibialis posterior tendon 06/15/2022   Wound infection 06/15/2022   Chronic ulcer of left ankle ([place]) 06/14/2022   Anemia 03/13/2019   Chronic venous insufficiency 03/12/2019   CKD (chronic kidney disease) stage 2, 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/2015   COPD (chronic obstructive pulmonary disease) ([place]) 12/08/2015   Chronic pain syndrome 05/26/2015   DDD (degenerative disc disease), lumbar 05/26/2015   Medication management 05/26/2015   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   MRN: [id number]  Chief Complaint  Patient presents with   Hospitalization Follow-up    Wound infection ankle -    HPI:  [name] Fernandez is a 82 y.o. male celiac disease, gerd s/p nissen, venous insufficiency, chronic left ankle wound complicated by OM here for hospital f/u  He was admitted early 06/2022 He was previously followed by wound care Dr Sharol Given performed I&D on 06/16/21 with wound vac -- distal fibula was resected and sent for cx which grew cipro resistant pseudomonas  He was sent out on opat with cefepime until 07/13/22 for 6 weeks  07/13/22 id clinic visit: He was seen in clinic by dr Sharol Given on 1/22. Exam with 5x10 cm wound and 75% granulation tissue. Patient advised silvadine dressing with soap cleansing of wound. Plan to f/u and  evaluate for tissue graft this coming week  1/15 picture --    Allergies  Allergen Reactions   Feraheme [Ferumoxytol] Swelling and Other (See Comments)    Swelling of lips and tongue, could not talk 30 minutes after the infusion.   Latex Other (See Comments)    Latex butterfly bandages tore off the skin and caused terrible blisters   Wound Dressing Adhesive Other (See Comments)    Latex butterfly bandages tore off the skin and caused terrible blisters   Levothyroxine Swelling and Other (See Comments)    Lip swelling   Doxycycline Nausea Only      Outpatient Medications Prior to Visit  Medication Sig Dispense Refill   acetaminophen (TYLENOL) 500 MG tablet Take 500-1,000 mg by mouth every 6 (six) hours as needed for mild pain or headache.     Ascorbic Acid (VITAMIN C) 1000 MG tablet Take 1,000 mg by mouth daily.     ceFEPime (MAXIPIME) IVPB Inject 2 g into the vein every 12 (twelve) hours. Indication:  Tenosynovitis 2/2 Pseudomonas aeruginosa  First Dose: Yes Last Day of Therapy:  07/28/22  Labs - Once weekly:  CBC/D and BMP, Labs - Every other week:  ESR and CRP Method of administration: IV Push Method of administration may be changed at the discretion of home infusion pharmacist based upon assessment of the patient and/or caregiver's ability to self-administer the medication ordered. 78 Units 0   Cholecalciferol (VITAMIN D3) 125 MCG (5000 UT) CAPS  Take 5,000 Units by mouth 2 (two) times daily.     gabapentin (NEURONTIN) 600 MG tablet Take 1/2 to 1 tablet 2 to 3 x /Daily as needed for Chronic Pain (Patient taking differently: Take 300-600 mg by mouth See admin instructions. Take 300 mg by mouth at bedtime and an additional 600 mg up to twice a day as needed for pain (is allowed to take up to a sum total of 1,800 mg/day)) 270 tablet 1   hydrOXYzine (ATARAX) 25 MG tablet Take  1 tablet  3 x /day  as needed  for Anxiety or Sleep (Patient taking differently: Take 25 mg by mouth at  bedtime.) 270 tablet 1   omeprazole (PRILOSEC OTC) 20 MG tablet Take 20 mg by mouth daily before breakfast.     senna-docusate (SENOKOT-S) 8.6-50 MG tablet Take 2 tablets by mouth 2 (two) times daily. 30 tablet 0   silver sulfADIAZINE (SILVADENE) 1 % cream Apply 1 Application topically daily. Apply to affected area daily plus dry dressing 400 g 3   torsemide (DEMADEX) 20 MG tablet Take  1 tablet  Daily for Fluid Retention / Leg Swelling (Patient taking differently: Take 20 mg by mouth in the morning.) 90 tablet 3   fluticasone (FLONASE) 50 MCG/ACT nasal spray Place 1-2 sprays into both nostrils daily as needed for allergies or rhinitis. (Patient not taking: Reported on 07/13/2022)     NON FORMULARY Take 1 capsule by mouth See admin instructions. Thyroid Care Plus capsules- Take 1 capsule by mouth once a day (Patient not taking: Reported on 07/13/2022)     oxyCODONE (OXY IR/ROXICODONE) 5 MG immediate release tablet Take 1 tablet (5 mg total) by mouth every 6 (six) hours as needed for severe pain. (Patient not taking: Reported on 07/13/2022) 30 tablet 0   polyethylene glycol (MIRALAX / GLYCOLAX) 17 g packet Take 17 g by mouth daily. (Patient not taking: Reported on 07/13/2022) 14 each 0   No facility-administered medications prior to visit.     Social History   Socioeconomic History   Marital status: Married    Spouse name: Not on file   Number of children: 2   Years of education: Not on file   Highest education level: Not on file  Occupational History   Occupation: Retired    Fish farm manager: [employer]  Tobacco Use   Smoking status: Former    Packs/day: 1.00    Years: 20.00    Total pack years: 20.00    Types: Cigarettes    Quit date: 06/27/1980    Years since quitting: 42.0    Passive exposure: Past   Smokeless tobacco: Never  Vaping Use   Vaping Use: Never used  Substance and Sexual Activity   Alcohol use: No   Drug use: No   Sexual activity: Yes  Other Topics Concern   Not  on file  Social History Narrative   Not on file   Social Determinants of Health   Financial Resource Strain: Not on file  Food Insecurity: No Food Insecurity (06/15/2022)   Hunger Vital Sign    Worried About Running Out of Food in the Last Year: Never true    Ran Out of Food in the Last Year: Never true  Transportation Needs: No Transportation Needs (06/15/2022)   PRAPARE - Hydrologist (Medical): No    Lack of Transportation (Non-Medical): No  Physical Activity: Not on file  Stress: Not on file  Social Connections: Not  on file  Intimate Partner Violence: Not At Risk (06/15/2022)   Humiliation, Afraid, Rape, and Kick questionnaire    Fernandez of Current or Ex-Partner: No    Emotionally Abused: No    Physically Abused: No    Sexually Abused: No      Review of Systems    All other ros negative  Objective:    Wt 157 lb 12.8 oz (71.6 kg)   BMI 25.48 kg/m  Nursing note and vital signs reviewed.  Physical Exam     General/constitutional: no distress, pleasant HEENT: Normocephalic, PER, Conj Clear, EOMI, Oropharynx clear Neck supple CV: rrr no mrg Lungs: clear to auscultation, normal respiratory effort Abd: Soft, Nontender Ext: no edema Skin/msk: reviewed pics 2 days prior to visit; healthy wound with slight white/beig oozing due to silvadne     Labs: Opat labs reviewed 07/06/22 cbc 8/12/458 (eos 600); cr 1.06; no lft 06/29/22 cbc 8/12/365; cr 0.97  Esr/crp 1/25        40  /   1 1/18        34  /   8   Micro:  Serology:  Imaging:  Assessment & Plan:   Problem List Items Addressed This Visit   None Visit Diagnoses     Chronic osteomyelitis ([place])    -  Primary   Venous stasis ulcer, unspecified site, unspecified ulcer stage, unspecified whether varicose veins present ([place])           Abx: cefepime 06/16/22 - current (eot 07/28/22)  Left leg distal fib OM in setting venous stasis ulcer, s/p I&D and partial resection 06/16/2022 (psA  R cipro) Clinically improving   The oozing of white/beige from wound is likely related to silvadne's use (it didn't ooze until when he starts silvadne)  Crp down.   Rash likely not related to cefepime. Eos is slightly up so will monitor for worsening rash  Will seen end of 07/2022 (2 weeks after he finishes cefepime). Leave picc on until then (no oral options for pseudomonas)  F/u dr Sharol Given as advised by him     Follow-up: Return in about 4 weeks (around 08/10/2022).      Jabier Mutton, [place] for Infectious Disease [facility] 07/13/2022, 2:29 PM

## 2022-07-14 DIAGNOSIS — M86262 Subacute osteomyelitis, left tibia and fibula: Secondary | ICD-10-CM | POA: Diagnosis not present

## 2022-07-18 ENCOUNTER — Ambulatory Visit (INDEPENDENT_AMBULATORY_CARE_PROVIDER_SITE_OTHER): Payer: Medicare HMO | Admitting: Orthopedic Surgery

## 2022-07-18 ENCOUNTER — Encounter: Payer: Self-pay | Admitting: Orthopedic Surgery

## 2022-07-18 DIAGNOSIS — S91002A Unspecified open wound, left ankle, initial encounter: Secondary | ICD-10-CM

## 2022-07-18 NOTE — Progress Notes (Signed)
Office Visit Note   Patient: [name] Fernandez           Date of Birth: July 10, 1940           MRN: [id number] Visit Date: 07/18/2022              Requested by: Unk Pinto, [place],  [place] [address] PCP: Unk Pinto, MD  Chief Complaint  Patient presents with   Left Ankle - Routine Post Op    06/16/2022 I&D left ankle kerecis graft       HPI: Patient is an 82 year old gentleman who presents in follow-up status post debridement and tissue graft reinforcement for chronic left ankle ulcer laterally.  Patient has been doing Silvadene dressing changes he has a PICC line and will continue antibiotics for 2 more weeks.  Assessment & Plan: Visit Diagnoses:  1. Ankle wound, left, initial encounter     Plan: Will plan for repeat debridement and application of Kerecis tissue graft with a wound VAC.  Plan for surgery on Friday as an outpatient follow-up in the office in 1 week.  Follow-Up Instructions: Return in about 1 week (around 07/25/2022).   Ortho Exam  Patient is alert, oriented, no adenopathy, well-dressed, normal affect, normal respiratory effort. Examination patient has significant swelling.  There is no cellulitis odor or drainage.  He has hypergranulation tissue in the wound there is good superficial epithelialization around the wound edges however the wound measures 8 x 9 cm.  Patient has a history of vein ablation in the leg.  With these increased swelling and wound breakdown medially we will need to proceed with additional debridement and tissue grafting.  Imaging: No results found.   Labs: Lab Results  Component Value Date   HGBA1C 6.4 (H) 04/04/2022   HGBA1C 5.9 (H) 09/14/2021   HGBA1C 5.8 (H) 03/16/2021   ESRSEDRATE 32 (H) 06/15/2022   ESRSEDRATE 40 (H) 06/14/2022   CRP 0.8 06/15/2022   CRP <0.5 06/14/2022   LABURIC 7.8 05/20/2014   LABURIC 6.8 05/20/2013   REPTSTATUS 06/21/2022 FINAL 06/16/2022   REPTSTATUS 06/21/2022  FINAL 06/16/2022   GRAMSTAIN NO WBC SEEN RARE GRAM NEGATIVE RODS  06/16/2022   GRAMSTAIN  06/16/2022    FEW WBC PRESENT,BOTH PMN AND MONONUCLEAR RARE GRAM NEGATIVE RODS    CULT  06/16/2022    FEW PSEUDOMONAS AERUGINOSA NO ANAEROBES ISOLATED Performed at [facility], [place] [address] [place], [place] [address]    CULT  06/16/2022    FEW PSEUDOMONAS AERUGINOSA NO ANAEROBES ISOLATED Performed at [facility], [place] [address] [place], [place] [address]    LABORGA PSEUDOMONAS AERUGINOSA 06/16/2022   LABORGA PSEUDOMONAS AERUGINOSA 06/16/2022     Lab Results  Component Value Date   ALBUMIN 2.9 (L) 06/16/2022   ALBUMIN 3.8 06/14/2022   ALBUMIN 4.0 09/25/2016   PREALBUMIN 18 06/14/2022    Lab Results  Component Value Date   MG 1.8 06/19/2022   MG 1.9 04/04/2022   MG 1.9 03/16/2021   Lab Results  Component Value Date   VD25OH 59 04/04/2022   VD25OH 44 09/28/2020   VD25OH 35 07/24/2019    Lab Results  Component Value Date   PREALBUMIN 18 06/14/2022      Latest Ref Rng & Units 06/20/2022    3:24 AM 06/18/2022    2:19 AM 06/17/2022   10:33 AM  CBC EXTENDED  WBC 4.0 - 10.5 K/uL 8.4  9.1  9.8  RBC 4.22 - 5.81 MIL/uL 3.44  3.33  3.43    3.49   Hemoglobin 13.0 - 17.0 g/dL 11.2  10.6  11.3   HCT 39.0 - 52.0 % 31.8  31.4  32.5   Platelets 150 - 400 K/uL 304  344  356      There is no height or weight on file to calculate BMI.  Orders:  No orders of the defined types were placed in this encounter.  No orders of the defined types were placed in this encounter.    Procedures: No procedures performed  Clinical Data: No additional findings.  ROS:  All other systems negative, except as noted in the HPI. Review of Systems  Objective: Vital Signs: There were no vitals taken for this visit.  Specialty Comments:  No specialty comments available.  PMFS History: Patient Active Problem List   Diagnosis Date Noted   Type 2 diabetes mellitus with  diabetic ankle ulcer ([place]) 06/16/2022   Osteomyelitis ([place]) 06/16/2022   Tenosynovitis of tibialis posterior tendon 06/15/2022   Wound infection 06/15/2022   Chronic ulcer of left ankle ([place]) 06/14/2022   Anemia 03/13/2019   Chronic venous insufficiency 03/12/2019   CKD (chronic kidney disease) stage 2, 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/2015   COPD (chronic obstructive pulmonary disease) ([place]) 12/08/2015   Chronic pain syndrome 05/26/2015   DDD (degenerative disc disease), lumbar 05/26/2015   Medication management 05/26/2015   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   Lumbago with sciatica 08/06/2014   Hyperlipidemia 08/20/2013   Hypertension    Abnormal glucose    Vitamin D deficiency    Arthritis    Vitamin B12 deficiency    Celiac disease 06/19/2012   GERD (gastroesophageal reflux disease) 04/30/2012   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 tube) procedure 04/30/2012   Hypothyroidism 04/30/2012   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   Past Medical History:  Diagnosis Date   Anemia    Arthritis    Celiac disease    diagnoses 06/24/12   Esophageal stricture    H/O hiatal hernia    Hiatal hernia    Hypertension    Hypothyroidism    Prediabetes    Shortness of breath    from oxycodone at times   Vitamin B12 deficiency    Vitamin D deficiency     Family History  Problem Relation Age of Onset   Esophageal cancer Father     Past Surgical History:  Procedure Laterality Date   APPENDECTOMY     BACK SURGERY     3 diff surgeries for vertebrea broken   EYE SURGERY     bilateral cataract surgery   HERNIA REPAIR  02/10/1949   bilateral groin as child   HIATAL HERNIA REPAIR     I & D EXTREMITY Left 06/16/2022   Procedure: IRRIGATION AND DEBRIDEMENT ANKLE;  Surgeon: Newt Minion, MD;  Location: [facility];  Service: Orthopedics;  Laterality: Left;   JOINT REPLACEMENT  04/12/2010   right knee replacement   TONSILLECTOMY     as child   TOTAL KNEE REVISION  11/20/2011   Procedure: TOTAL  KNEE REVISION;  Surgeon: Mauri Pole, MD;  Location[facility];  Service: Orthopedics;  Laterality: Right;  Right Total Knee Revision   Social History   Occupational History   Occupation: Retired    Fish farm manager: [employer]  Tobacco Use   Smoking status: Former    Packs/day: 1.00    Years: 20.00    Total pack years: 20.00  Types: Cigarettes    Quit date: 06/27/1980    Years since quitting: 42.0    Passive exposure: Past   Smokeless tobacco: Never  Vaping Use   Vaping Use: Never used  Substance and Sexual Activity   Alcohol use: No   Drug use: No   Sexual activity: Yes

## 2022-07-19 ENCOUNTER — Encounter (HOSPITAL_COMMUNITY): Payer: Self-pay | Admitting: Orthopedic Surgery

## 2022-07-19 NOTE — Progress Notes (Signed)
Internal Med - Dr Unk Pinto Cardiologist - n/a  Chest x-ray - n/a EKG - 04/04/22 Stress Test - n/a ECHO - n/a Cardiac Cath - n/a  ICD Pacemaker/Loop - n/a  Sleep Study -  n/a CPAP - none  Diabetes - n/a  ERAS: Clear liquids til 5:15 AM DOS.  Anesthesia review: Yes  STOP now taking any Aspirin (unless otherwise instructed by your surgeon), Aleve, Naproxen, Ibuprofen, Motrin, Advil, Goody's, BC's, all herbal medications, fish oil, and all vitamins.   Coronavirus Screening Do you have any of the following symptoms:  Cough yes/no: No Fever (>100.7F)  yes/no: No Runny nose yes/no: No Sore throat yes/no: No Difficulty breathing/shortness of breath  yes/no: No  Have you traveled in the last 14 days and where? yes/no: No  Patient verbalized understanding of instructions that were given iva phone.

## 2022-07-20 DIAGNOSIS — N182 Chronic kidney disease, stage 2 (mild): Secondary | ICD-10-CM | POA: Diagnosis not present

## 2022-07-20 DIAGNOSIS — L97329 Non-pressure chronic ulcer of left ankle with unspecified severity: Secondary | ICD-10-CM | POA: Diagnosis not present

## 2022-07-20 DIAGNOSIS — M65172 Other infective (teno)synovitis, left ankle and foot: Secondary | ICD-10-CM | POA: Diagnosis not present

## 2022-07-20 DIAGNOSIS — E11622 Type 2 diabetes mellitus with other skin ulcer: Secondary | ICD-10-CM | POA: Diagnosis not present

## 2022-07-20 DIAGNOSIS — Z452 Encounter for adjustment and management of vascular access device: Secondary | ICD-10-CM | POA: Diagnosis not present

## 2022-07-20 DIAGNOSIS — B965 Pseudomonas (aeruginosa) (mallei) (pseudomallei) as the cause of diseases classified elsewhere: Secondary | ICD-10-CM | POA: Diagnosis not present

## 2022-07-20 DIAGNOSIS — I129 Hypertensive chronic kidney disease with stage 1 through stage 4 chronic kidney disease, or unspecified chronic kidney disease: Secondary | ICD-10-CM | POA: Diagnosis not present

## 2022-07-20 DIAGNOSIS — E1169 Type 2 diabetes mellitus with other specified complication: Secondary | ICD-10-CM | POA: Diagnosis not present

## 2022-07-20 DIAGNOSIS — M86662 Other chronic osteomyelitis, left tibia and fibula: Secondary | ICD-10-CM | POA: Diagnosis not present

## 2022-07-20 DIAGNOSIS — E1122 Type 2 diabetes mellitus with diabetic chronic kidney disease: Secondary | ICD-10-CM | POA: Diagnosis not present

## 2022-07-20 MED ORDER — PHENYLEPHRINE HCL-NACL 20-0.9 MG/250ML-% IV SOLN
INTRAVENOUS | Status: AC
  Filled 2022-07-20: qty 500

## 2022-07-20 MED ORDER — PROPOFOL 1000 MG/100ML IV EMUL
INTRAVENOUS | Status: AC
  Filled 2022-07-20: qty 100

## 2022-07-20 NOTE — H&P (Signed)
[name] Fernandez is an 82 y.o. male.   Chief Complaint: Nonhealing lateral left ankle wound. HPI: Patient is an 82 year old gentleman who presents in follow-up status post debridement and tissue graft reinforcement for chronic left ankle ulcer laterally. Patient has been doing Silvadene dressing changes he has a PICC line and will continue antibiotics for 2 more weeks.   Past Medical History:  Diagnosis Date   Anemia    Arthritis    Celiac disease    diagnoses 06/24/12   Esophageal stricture    GERD (gastroesophageal reflux disease)    H/O hiatal hernia    Hiatal hernia    Hypertension    patient states " no current HTN"   Hypothyroidism    Prediabetes    Shortness of breath    from oxycodone at times   Vitamin B12 deficiency    Vitamin D deficiency     Past Surgical History:  Procedure Laterality Date   APPENDECTOMY     BACK SURGERY     3 diff surgeries for vertebrea broken   EYE SURGERY     bilateral cataract surgery   HERNIA REPAIR  02/10/1949   bilateral groin as child   HIATAL HERNIA REPAIR     I & D EXTREMITY Left 06/16/2022   Procedure: IRRIGATION AND DEBRIDEMENT ANKLE;  Surgeon: Newt Minion, MD;  Location: [facility];  Service: Orthopedics;  Laterality: Left;   JOINT REPLACEMENT  04/12/2010   right knee replacement   TONSILLECTOMY     as child   TOTAL KNEE REVISION  11/20/2011   Procedure: TOTAL KNEE REVISION;  Surgeon: Mauri Pole, MD;  Location: [facility];  Service: Orthopedics;  Laterality: Right;  Right Total Knee Revision    Family History  Problem Relation Age of Onset   Esophageal cancer Father    Social History:  reports that he quit smoking about 42 years ago. His smoking use included cigarettes. He has a 20.00 pack-year smoking history. He has been exposed to tobacco smoke. He has never used smokeless tobacco. He reports that he does not drink alcohol and does not use drugs.  Allergies:  Allergies  Allergen Reactions   Feraheme [Ferumoxytol] Swelling  and Other (See Comments)    Swelling of lips and tongue, could not talk 30 minutes after the infusion.   Latex Other (See Comments)    Latex butterfly bandages tore off the skin and caused terrible blisters   Wound Dressing Adhesive Other (See Comments)    Latex butterfly bandages tore off the skin and caused terrible blisters   Levothyroxine Swelling and Other (See Comments)    Lip swelling   Doxycycline Nausea Only    No medications prior to admission.    No results found for this or any previous visit (from the past 48 hour(s)). No results found.  Review of Systems  All other systems reviewed and are negative.   Height 5\' 6"  (1.676 m), weight 71.6 kg. Physical Exam  Patient is alert, oriented, no adenopathy, well-dressed, normal affect, normal respiratory effort. Examination patient has significant swelling.  There is no cellulitis odor or drainage.  He has hypergranulation tissue in the wound there is good superficial epithelialization around the wound edges however the wound measures 8 x 9 cm.  Patient has a history of vein ablation in the leg.  With these increased swelling and wound breakdown medially we will need to proceed with additional debridement and tissue grafting. Assessment/Plan . Ankle wound, left, initial encounter  Plan: Will plan for repeat debridement and application of Kerecis tissue graft with a wound VAC.  Plan for surgery on Friday as an outpatient follow-up in the office in 1 week.  Newt Minion, MD 07/20/2022, 5:33 PM

## 2022-07-21 ENCOUNTER — Ambulatory Visit (HOSPITAL_COMMUNITY)
Admission: RE | Admit: 2022-07-21 | Discharge: 2022-07-21 | Disposition: A | Discharge status: Home / Self Care | Payer: Medicare HMO | Attending: Orthopedic Surgery | Admitting: Orthopedic Surgery

## 2022-07-21 ENCOUNTER — Encounter (HOSPITAL_COMMUNITY)
Admission: RE | Disposition: A | Discharge status: Home / Self Care | Payer: Self-pay | Source: Home / Self Care | Attending: Orthopedic Surgery

## 2022-07-21 ENCOUNTER — Encounter (HOSPITAL_COMMUNITY): Payer: Self-pay | Admitting: Orthopedic Surgery

## 2022-07-21 ENCOUNTER — Other Ambulatory Visit: Payer: Self-pay

## 2022-07-21 ENCOUNTER — Ambulatory Visit (HOSPITAL_COMMUNITY): Payer: Medicare HMO | Admitting: Physician Assistant

## 2022-07-21 ENCOUNTER — Ambulatory Visit (HOSPITAL_BASED_OUTPATIENT_CLINIC_OR_DEPARTMENT_OTHER): Payer: Medicare HMO | Admitting: Physician Assistant

## 2022-07-21 DIAGNOSIS — Z87891 Personal history of nicotine dependence: Secondary | ICD-10-CM | POA: Insufficient documentation

## 2022-07-21 DIAGNOSIS — L97329 Non-pressure chronic ulcer of left ankle with unspecified severity: Secondary | ICD-10-CM | POA: Diagnosis not present

## 2022-07-21 DIAGNOSIS — I10 Essential (primary) hypertension: Secondary | ICD-10-CM | POA: Diagnosis not present

## 2022-07-21 DIAGNOSIS — K219 Gastro-esophageal reflux disease without esophagitis: Secondary | ICD-10-CM | POA: Diagnosis not present

## 2022-07-21 DIAGNOSIS — E11622 Type 2 diabetes mellitus with other skin ulcer: Secondary | ICD-10-CM | POA: Diagnosis not present

## 2022-07-21 DIAGNOSIS — S91002D Unspecified open wound, left ankle, subsequent encounter: Secondary | ICD-10-CM | POA: Diagnosis not present

## 2022-07-21 DIAGNOSIS — N189 Chronic kidney disease, unspecified: Secondary | ICD-10-CM

## 2022-07-21 DIAGNOSIS — L97309 Non-pressure chronic ulcer of unspecified ankle with unspecified severity: Secondary | ICD-10-CM | POA: Diagnosis not present

## 2022-07-21 DIAGNOSIS — K900 Celiac disease: Secondary | ICD-10-CM | POA: Diagnosis not present

## 2022-07-21 DIAGNOSIS — K449 Diaphragmatic hernia without obstruction or gangrene: Secondary | ICD-10-CM | POA: Diagnosis not present

## 2022-07-21 DIAGNOSIS — I129 Hypertensive chronic kidney disease with stage 1 through stage 4 chronic kidney disease, or unspecified chronic kidney disease: Secondary | ICD-10-CM

## 2022-07-21 DIAGNOSIS — S91002A Unspecified open wound, left ankle, initial encounter: Secondary | ICD-10-CM | POA: Diagnosis not present

## 2022-07-21 DIAGNOSIS — M1990 Unspecified osteoarthritis, unspecified site: Secondary | ICD-10-CM | POA: Diagnosis not present

## 2022-07-21 DIAGNOSIS — M86262 Subacute osteomyelitis, left tibia and fibula: Secondary | ICD-10-CM | POA: Diagnosis not present

## 2022-07-21 DIAGNOSIS — E039 Hypothyroidism, unspecified: Secondary | ICD-10-CM | POA: Insufficient documentation

## 2022-07-21 DIAGNOSIS — J449 Chronic obstructive pulmonary disease, unspecified: Secondary | ICD-10-CM | POA: Insufficient documentation

## 2022-07-21 DIAGNOSIS — E1122 Type 2 diabetes mellitus with diabetic chronic kidney disease: Secondary | ICD-10-CM

## 2022-07-21 HISTORY — DX: Gastro-esophageal reflux disease without esophagitis: K21.9

## 2022-07-21 HISTORY — PX: I & D EXTREMITY: SHX5045

## 2022-07-21 LAB — GLUCOSE, CAPILLARY: Glucose-Capillary: 100 mg/dL — ABNORMAL HIGH (ref 70–99)

## 2022-07-21 SURGERY — IRRIGATION AND DEBRIDEMENT EXTREMITY
Anesthesia: General | Laterality: Left

## 2022-07-21 MED ORDER — HEPARIN SOD (PORK) LOCK FLUSH 100 UNIT/ML IV SOLN
250.0000 [IU] | INTRAVENOUS | Status: AC | PRN
  Administered 2022-07-21: 250 [IU]

## 2022-07-21 MED ORDER — FENTANYL CITRATE (PF) 250 MCG/5ML IJ SOLN
INTRAMUSCULAR | Status: AC
  Filled 2022-07-21: qty 5

## 2022-07-21 MED ORDER — CEFAZOLIN SODIUM-DEXTROSE 2-4 GM/100ML-% IV SOLN
2.0000 g | INTRAVENOUS | Status: AC
  Administered 2022-07-21: 2 g via INTRAVENOUS
  Filled 2022-07-21: qty 100

## 2022-07-21 MED ORDER — 0.9 % SODIUM CHLORIDE (POUR BTL) OPTIME
TOPICAL | Status: DC | PRN
  Administered 2022-07-21: 1000 mL

## 2022-07-21 MED ORDER — ONDANSETRON HCL 4 MG/2ML IJ SOLN
INTRAMUSCULAR | Status: AC
  Filled 2022-07-21: qty 2

## 2022-07-21 MED ORDER — PROPOFOL 10 MG/ML IV BOLUS
INTRAVENOUS | Status: DC | PRN
  Administered 2022-07-21: 140 mg via INTRAVENOUS
  Administered 2022-07-21: 20 mg via INTRAVENOUS

## 2022-07-21 MED ORDER — ONDANSETRON HCL 4 MG/2ML IJ SOLN
INTRAMUSCULAR | Status: DC | PRN
  Administered 2022-07-21: 4 mg via INTRAVENOUS

## 2022-07-21 MED ORDER — ORAL CARE MOUTH RINSE: 15.0000 mL | Freq: Once | OROMUCOSAL | Status: AC

## 2022-07-21 MED ORDER — CHLORHEXIDINE GLUCONATE 0.12 % MT SOLN: 15.0000 mL | Freq: Once | OROMUCOSAL | Status: AC

## 2022-07-21 MED ORDER — ONDANSETRON HCL 4 MG/2ML IJ SOLN: 4.0000 mg | Freq: Once | INTRAMUSCULAR | Status: DC | PRN

## 2022-07-21 MED ORDER — GLYCOPYRROLATE 0.2 MG/ML IJ SOLN
INTRAMUSCULAR | Status: DC | PRN
  Administered 2022-07-21: .2 mg via INTRAVENOUS

## 2022-07-21 MED ORDER — PROPOFOL 10 MG/ML IV BOLUS
INTRAVENOUS | Status: AC
  Filled 2022-07-21: qty 20

## 2022-07-21 MED ORDER — OXYCODONE HCL 5 MG PO TABS
ORAL_TABLET | ORAL | Status: AC
  Administered 2022-07-21: 5 mg via ORAL
  Filled 2022-07-21: qty 1

## 2022-07-21 MED ORDER — FENTANYL CITRATE (PF) 100 MCG/2ML IJ SOLN
INTRAMUSCULAR | Status: AC
  Administered 2022-07-21: 25 ug via INTRAVENOUS
  Filled 2022-07-21: qty 2

## 2022-07-21 MED ORDER — VANCOMYCIN HCL 1000 MG IV SOLR
INTRAVENOUS | Status: DC | PRN
  Administered 2022-07-21: 1000 mg

## 2022-07-21 MED ORDER — FENTANYL CITRATE (PF) 100 MCG/2ML IJ SOLN
INTRAMUSCULAR | Status: DC | PRN
  Administered 2022-07-21 (×3): 50 ug via INTRAVENOUS

## 2022-07-21 MED ORDER — LIDOCAINE 2% (20 MG/ML) 5 ML SYRINGE
INTRAMUSCULAR | Status: AC
  Filled 2022-07-21: qty 5

## 2022-07-21 MED ORDER — ACETAMINOPHEN 500 MG PO TABS
1000.0000 mg | ORAL_TABLET | Freq: Once | ORAL | Status: AC
  Administered 2022-07-21: 1000 mg via ORAL

## 2022-07-21 MED ORDER — OXYCODONE HCL 5 MG PO TABS: 5.0000 mg | ORAL_TABLET | Freq: Once | ORAL | Status: AC | PRN

## 2022-07-21 MED ORDER — DEXAMETHASONE SODIUM PHOSPHATE 10 MG/ML IJ SOLN
INTRAMUSCULAR | Status: AC
  Filled 2022-07-21: qty 1

## 2022-07-21 MED ORDER — LIDOCAINE 2% (20 MG/ML) 5 ML SYRINGE
INTRAMUSCULAR | Status: DC | PRN
  Administered 2022-07-21: 60 mg via INTRAVENOUS

## 2022-07-21 MED ORDER — CHLORHEXIDINE GLUCONATE 0.12 % MT SOLN
OROMUCOSAL | Status: AC
  Administered 2022-07-21: 15 mL via OROMUCOSAL
  Filled 2022-07-21: qty 15

## 2022-07-21 MED ORDER — DEXAMETHASONE SODIUM PHOSPHATE 4 MG/ML IJ SOLN
INTRAMUSCULAR | Status: DC | PRN
  Administered 2022-07-21: 4 mg via INTRAVENOUS

## 2022-07-21 MED ORDER — LACTATED RINGERS IV SOLN: INTRAVENOUS | Status: DC

## 2022-07-21 MED ORDER — FENTANYL CITRATE (PF) 100 MCG/2ML IJ SOLN
25.0000 ug | INTRAMUSCULAR | Status: DC | PRN
  Administered 2022-07-21: 50 ug via INTRAVENOUS
  Administered 2022-07-21: 25 ug via INTRAVENOUS

## 2022-07-21 MED ORDER — PHENYLEPHRINE HCL-NACL 20-0.9 MG/250ML-% IV SOLN
INTRAVENOUS | Status: DC | PRN
  Administered 2022-07-21: 100 ug/min via INTRAVENOUS

## 2022-07-21 MED ORDER — VANCOMYCIN HCL 1000 MG IV SOLR
INTRAVENOUS | Status: AC
  Filled 2022-07-21: qty 20

## 2022-07-21 MED ORDER — OXYCODONE-ACETAMINOPHEN 5-325 MG PO TABS: 1.0000 | ORAL_TABLET | ORAL | 0 refills | Status: DC | PRN

## 2022-07-21 MED ORDER — OXYCODONE HCL 5 MG/5ML PO SOLN: 5.0000 mg | Freq: Once | ORAL | Status: AC | PRN

## 2022-07-21 SURGICAL SUPPLY — 42 items
BAG COUNTER SPONGE SURGICOUNT (BAG) IMPLANT
BAG SPNG CNTER NS LX DISP (BAG)
BLADE SURG 21 STRL SS (BLADE) ×1 IMPLANT
BNDG CMPR 5X6 CHSV STRCH STRL (GAUZE/BANDAGES/DRESSINGS) ×1
BNDG COHESIVE 6X5 TAN NS LF (GAUZE/BANDAGES/DRESSINGS) IMPLANT
BNDG COHESIVE 6X5 TAN ST LF (GAUZE/BANDAGES/DRESSINGS) IMPLANT
BNDG COHESIVE 6X5 TAN STRL LF (GAUZE/BANDAGES/DRESSINGS) IMPLANT
BNDG GAUZE DERMACEA FLUFF 4 (GAUZE/BANDAGES/DRESSINGS) ×2 IMPLANT
BNDG GZE DERMACEA 4 6PLY (GAUZE/BANDAGES/DRESSINGS) ×1
COVER SURGICAL LIGHT HANDLE (MISCELLANEOUS) ×2 IMPLANT
DRAPE U-SHAPE 47X51 STRL (DRAPES) ×1 IMPLANT
DRESSING VERAFLO CLEANS CC MED (GAUZE/BANDAGES/DRESSINGS) IMPLANT
DRSG ADAPTIC 3X8 NADH LF (GAUZE/BANDAGES/DRESSINGS) ×1 IMPLANT
DRSG VERAFLO CLEANSE CC MED (GAUZE/BANDAGES/DRESSINGS) ×1
DURAPREP 26ML APPLICATOR (WOUND CARE) ×1 IMPLANT
ELECT REM PT RETURN 9FT ADLT (ELECTROSURGICAL)
ELECTRODE REM PT RTRN 9FT ADLT (ELECTROSURGICAL) IMPLANT
GAUZE PAD ABD 8X10 STRL (GAUZE/BANDAGES/DRESSINGS) IMPLANT
GAUZE SPONGE 4X4 12PLY STRL (GAUZE/BANDAGES/DRESSINGS) ×1 IMPLANT
GAUZE SPONGE 4X4 12PLY STRL LF (GAUZE/BANDAGES/DRESSINGS) IMPLANT
GLOVE BIOGEL PI IND STRL 9 (GLOVE) ×1 IMPLANT
GLOVE SURG ORTHO 9.0 STRL STRW (GLOVE) ×1 IMPLANT
GOWN STRL REUS W/ TWL XL LVL3 (GOWN DISPOSABLE) ×2 IMPLANT
GOWN STRL REUS W/TWL XL LVL3 (GOWN DISPOSABLE) ×2
GRAFT SKIN WND MICRO 38 (Tissue) IMPLANT
GRAFT SKIN WND OMEGA3 SB 7X10 (Tissue) IMPLANT
HANDPIECE INTERPULSE COAX TIP (DISPOSABLE)
KIT BASIN OR (CUSTOM PROCEDURE TRAY) ×1 IMPLANT
KIT TURNOVER KIT B (KITS) ×1 IMPLANT
MANIFOLD NEPTUNE II (INSTRUMENTS) ×1 IMPLANT
NS IRRIG 1000ML POUR BTL (IV SOLUTION) ×1 IMPLANT
PACK ORTHO EXTREMITY (CUSTOM PROCEDURE TRAY) ×1 IMPLANT
PAD ARMBOARD 7.5X6 YLW CONV (MISCELLANEOUS) ×2 IMPLANT
PAD NEG PRESSURE SENSATRAC (MISCELLANEOUS) IMPLANT
SET HNDPC FAN SPRY TIP SCT (DISPOSABLE) IMPLANT
STOCKINETTE IMPERVIOUS 9X36 MD (GAUZE/BANDAGES/DRESSINGS) IMPLANT
SUT ETHILON 2 0 PSLX (SUTURE) ×1 IMPLANT
SWAB COLLECTION DEVICE MRSA (MISCELLANEOUS) ×1 IMPLANT
SWAB CULTURE ESWAB REG 1ML (MISCELLANEOUS) IMPLANT
TOWEL GREEN STERILE (TOWEL DISPOSABLE) ×1 IMPLANT
TUBE CONNECTING 12X1/4 (SUCTIONS) ×1 IMPLANT
YANKAUER SUCT BULB TIP NO VENT (SUCTIONS) ×1 IMPLANT

## 2022-07-21 NOTE — Anesthesia Procedure Notes (Signed)
Procedure Name: LMA Insertion Date/Time: 07/21/2022 8:24 AM  Performed by: Lieutenant Diego, CRNAPre-anesthesia Checklist: Patient identified, Emergency Drugs available, Suction available and Patient being monitored Patient Re-evaluated:Patient Re-evaluated prior to induction Oxygen Delivery Method: Circle system utilized Preoxygenation: Pre-oxygenation with 100% oxygen Induction Type: IV induction Ventilation: Mask ventilation without difficulty LMA: LMA inserted LMA Size: 4.0 Number of attempts: 1 Placement Confirmation: positive ETCO2 and breath sounds checked- equal and bilateral Tube secured with: Tape Dental Injury: Teeth and Oropharynx as per pre-operative assessment

## 2022-07-21 NOTE — Op Note (Signed)
07/21/2022  9:02 AM  PATIENT:  [name] Fernandez    PRE-OPERATIVE DIAGNOSIS:  Left Ankle Wound  POST-OPERATIVE DIAGNOSIS:  Same  PROCEDURE:  LEFT ANKLE DEBRIDEMENT Excision of skin and soft tissue muscle and fascia. Application of vancomycin 1 g, Kerecis micro graft 38 cm, Kerecis sheet 7 x 10 cm. Application of cleanse choice wound VAC sponge.  SURGEON:  Newt Minion, MD  PHYSICIAN ASSISTANT:None ANESTHESIA:   General  PREOPERATIVE INDICATIONS:  [name] is a  82 y.o. male with a diagnosis of Left Ankle Wound who failed conservative measures and elected for surgical management.    The risks benefits and alternatives were discussed with the patient preoperatively including but not limited to the risks of infection, bleeding, nerve injury, cardiopulmonary complications, the need for revision surgery, among others, and the patient was willing to proceed.  OPERATIVE IMPLANTS: Vancomycin powder 1 g, Kerecis micro graft 38 cm, Kerecis sheet 7 x 10 cm.  @ENCIMAGES$ @  OPERATIVE FINDINGS: Good granulation tissue after debridement.  Tissue was sent for cultures.  OPERATIVE PROCEDURE: Patient brought the operating room and underwent a general anesthetic.  After adequate levels anesthesia were obtained patient's left lower extremity was prepped using DuraPrep draped into a sterile field a timeout was called.  A 21 blade knife was used to excise skin and soft tissue muscle and fascia.  Rondure was also used for further debridement.  There was good petechial bleeding.  The wound was irrigated with normal saline.  1 g vancomycin powder was applied.  This was then covered with the Kerecis micro graft 38 cm Kerecis sheet.  This was secured with a cleanse choice wound VAC sponge derma tack Ioband and Coban.  Patient was extubated taken the PACU in stable condition.   DISCHARGE PLANNING:  Antibiotic duration: Continue IV antibiotics at home  Weightbearing: Weightbearing as tolerated  Pain  medication: Prescription for Percocet  Dressing care/ Wound VAC: Wound VAC  Ambulatory devices: Crutches or walker  Discharge to: Home.  Follow-up: In the office 1 week post operative.

## 2022-07-21 NOTE — Transfer of Care (Signed)
Immediate Anesthesia Transfer of Care Note  Patient: [name] Fernandez  Procedure(s) Performed: LEFT ANKLE DEBRIDEMENT (Left)  Patient Location: PACU  Anesthesia Type:General  Level of Consciousness: awake and alert   Airway & Oxygen Therapy: Patient Spontanous Breathing and Patient connected to face mask oxygen  Post-op Assessment: Report given to RN and Post -op Vital signs reviewed and stable  Post vital signs: Reviewed and stable  Last Vitals:  Vitals Value Taken Time  BP 132/89 07/21/22 0900  Temp    Pulse 75 07/21/22 0900  Resp 13 07/21/22 0900  SpO2 97 % 07/21/22 0900  Vitals shown include unvalidated device data.  Last Pain:  Vitals:   07/21/22 0652  TempSrc:   PainSc: 4          Complications: No notable events documented.

## 2022-07-21 NOTE — Anesthesia Postprocedure Evaluation (Signed)
Anesthesia Post Note  Patient: [name] Fernandez  Procedure(s) Performed: LEFT ANKLE DEBRIDEMENT (Left)     Patient location during evaluation: PACU Anesthesia Type: General Level of consciousness: awake and alert Pain management: pain level controlled Vital Signs Assessment: post-procedure vital signs reviewed and stable Respiratory status: spontaneous breathing, nonlabored ventilation and respiratory function stable Cardiovascular status: stable and blood pressure returned to baseline Anesthetic complications: no   No notable events documented.  Last Vitals:  Vitals:   07/21/22 0915 07/21/22 0945  BP: (!) 145/82   Pulse: 75   Resp: 10   Temp:  (!) 36.1 C  SpO2: 96%     Last Pain:  Vitals:   07/21/22 0945  TempSrc:   PainSc: [place]

## 2022-07-21 NOTE — Interval H&P Note (Signed)
History and Physical Interval Note:  07/21/2022 6:38 AM  [name] Fernandez  has presented today for surgery, with the diagnosis of Left Ankle Wound.  The various methods of treatment have been discussed with the patient and family. After consideration of risks, benefits and other options for treatment, the patient has consented to  Procedure(s): LEFT ANKLE DEBRIDEMENT (Left) as a surgical intervention.  The patient's history has been reviewed, patient examined, no change in status, stable for surgery.  I have reviewed the patient's chart and labs.  Questions were answered to the patient's satisfaction.     Newt Minion

## 2022-07-21 NOTE — Anesthesia Preprocedure Evaluation (Addendum)
Anesthesia Evaluation  Patient identified by MRN, date of birth, ID band Patient awake    Reviewed: Allergy & Precautions, NPO status , Patient's Chart, lab work & pertinent test results  History of Anesthesia Complications Negative for: history of anesthetic complications  Airway Mallampati: II  TM Distance: >3 FB Neck ROM: Full    Dental  (+) Dental Advisory Given, Edentulous Lower, Partial Upper   Pulmonary COPD, former smoker   Pulmonary exam normal        Cardiovascular hypertension, Pt. on medications Normal cardiovascular exam     Neuro/Psych negative neurological ROS  negative psych ROS   GI/Hepatic Neg liver ROS, hiatal hernia,GERD  Medicated and Controlled,, Celiac disease    Endo/Other  diabetesHypothyroidism    Renal/GU CRFRenal disease     Musculoskeletal  (+) Arthritis ,    Abdominal   Peds  Hematology negative hematology ROS (+)   Anesthesia Other Findings   Reproductive/Obstetrics                             Anesthesia Physical Anesthesia Plan  ASA: 3  Anesthesia Plan: General   Post-op Pain Management: Tylenol PO (pre-op)*   Induction: Intravenous  PONV Risk Score and Plan: 2 and Treatment may vary due to age or medical condition and Ondansetron  Airway Management Planned: LMA  Additional Equipment: None  Intra-op Plan:   Post-operative Plan: Extubation in OR  Informed Consent: I have reviewed the patients History and Physical, chart, labs and discussed the procedure including the risks, benefits and alternatives for the proposed anesthesia with the patient or authorized representative who has indicated his/her understanding and acceptance.     Dental advisory given  Plan Discussed with: CRNA and Anesthesiologist  Anesthesia Plan Comments:         Anesthesia Quick Evaluation

## 2022-07-24 ENCOUNTER — Encounter (HOSPITAL_COMMUNITY): Payer: Self-pay | Admitting: Orthopedic Surgery

## 2022-07-24 ENCOUNTER — Ambulatory Visit (INDEPENDENT_AMBULATORY_CARE_PROVIDER_SITE_OTHER): Payer: Medicare HMO | Admitting: Orthopedic Surgery

## 2022-07-24 ENCOUNTER — Telehealth: Payer: Self-pay | Admitting: Orthopedic Surgery

## 2022-07-24 DIAGNOSIS — S91002A Unspecified open wound, left ankle, initial encounter: Secondary | ICD-10-CM

## 2022-07-24 NOTE — Progress Notes (Signed)
Office Visit Note   Patient: [name] Fernandez           Date of Birth: [birth year]           MRN: [id number] Visit Date: 07/24/2022              Requested by: Unk Pinto, [place],  [place] [address] PCP: Unk Pinto, MD  Chief Complaint  Patient presents with   Left Ankle - Routine Post Op    07/21/2022 left ankle debridement wound vac complication       HPI: Patient states that he has filled 1 canister and the charging cord is loose.  Assessment & Plan: Visit Diagnoses:  1. Ankle wound, left, initial encounter     Plan: Patient was given a new wound VAC pump.  Follow-up in 1 week.  Follow-Up Instructions: Return in about 1 week (around 07/31/2022).   Ortho Exam  Patient is alert, oriented, no adenopathy, well-dressed, normal affect, normal respiratory effort. Examination the wound VAC charging cord is loose and will not stay in place.  Imaging: No results found. No images are attached to the encounter.  Labs: Lab Results  Component Value Date   HGBA1C 6.4 (H) 04/04/2022   HGBA1C 5.9 (H) 09/14/2021   HGBA1C 5.8 (H) 03/16/2021   ESRSEDRATE 32 (H) 06/15/2022   ESRSEDRATE 40 (H) 06/14/2022   CRP 0.8 06/15/2022   CRP <0.5 06/14/2022   LABURIC 7.8 05/20/2014   LABURIC 6.8 05/20/2013   REPTSTATUS PENDING 07/21/2022   GRAMSTAIN  07/21/2022    FEW WBC PRESENT, PREDOMINANTLY PMN NO ORGANISMS SEEN    CULT  07/21/2022    NO GROWTH 3 DAYS NO ANAEROBES ISOLATED; CULTURE IN PROGRESS FOR 5 DAYS Performed at [facility], [place] [address], [place], [place] [address]    LABORGA PSEUDOMONAS AERUGINOSA 06/16/2022   LABORGA PSEUDOMONAS AERUGINOSA 06/16/2022     Lab Results  Component Value Date   ALBUMIN 2.9 (L) 06/16/2022   ALBUMIN 3.8 06/14/2022   ALBUMIN 4.0 09/25/2016   PREALBUMIN 18 06/14/2022    Lab Results  Component Value Date   MG 1.8 06/19/2022   MG 1.9 04/04/2022   MG 1.9 03/16/2021   Lab Results   Component Value Date   VD25OH 59 04/04/2022   VD25OH 44 09/28/2020   VD25OH 35 07/24/2019    Lab Results  Component Value Date   PREALBUMIN 18 06/14/2022      Latest Ref Rng & Units 06/20/2022    3:24 AM 06/18/2022    2:19 AM 06/17/2022   10:33 AM  CBC EXTENDED  WBC 4.0 - 10.5 K/uL 8.4  9.1  9.8   RBC 4.22 - 5.81 MIL/uL 3.44  3.33  3.43    3.49   Hemoglobin 13.0 - 17.0 g/dL 11.2  10.6  11.3   HCT 39.0 - 52.0 % 31.8  31.4  32.5   Platelets 150 - 400 K/uL 304  344  356      There is no height or weight on file to calculate BMI.  Orders:  No orders of the defined types were placed in this encounter.  No orders of the defined types were placed in this encounter.    Procedures: No procedures performed  Clinical Data: No additional findings.  ROS:  All other systems negative, except as noted in the HPI. Review of Systems  Objective: Vital Signs: There were no vitals taken for this visit.  Specialty Comments:  No specialty comments available.  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[place]) 06/16/2022   Osteomyelitis ([place]) 06/16/2022   Tenosynovitis of tibialis posterior tendon 06/15/2022   Wound infection 06/15/2022   Chronic ulcer of left ankle ([place]) 06/14/2022   Anemia 03/13/2019   Chronic venous insufficiency 03/12/2019   CKD (chronic kidney disease) stage 2, 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/2015   COPD (chronic obstructive pulmonary disease) ([place]) 12/08/2015   Chronic pain syndrome 05/26/2015   DDD (degenerative disc disease), lumbar 05/26/2015   Medication management 05/26/2015   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   Lumbago with sciatica 08/06/2014   Hyperlipidemia 08/20/2013   Hypertension    Abnormal glucose    Vitamin D deficiency    Arthritis    Vitamin B12 deficiency    Celiac disease 06/19/2012   GERD (gastroesophageal reflux disease) 04/30/2012   S/P Nissen fundoplication  (without gastrostomy tube) procedure 04/30/2012   Hypothyroidism 04/30/2012   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   Past Medical History:  Diagnosis Date   Anemia    Arthritis    Celiac disease    diagnoses 06/24/12   Esophageal stricture    GERD (gastroesophageal reflux disease)    H/O hiatal hernia    Hiatal hernia    Hypertension    patient states " no current HTN"   Hypothyroidism    Prediabetes    Shortness of breath    from oxycodone at times   Vitamin B12 deficiency    Vitamin D deficiency     Family History  Problem Relation Age of Onset   Esophageal cancer Father     Past Surgical History:  Procedure Laterality Date   APPENDECTOMY     BACK SURGERY     3 diff surgeries for vertebrea broken   EYE SURGERY     bilateral cataract surgery   HERNIA REPAIR  02/10/1949   bilateral groin as child   HIATAL HERNIA REPAIR     I & D EXTREMITY Left 06/16/2022   Procedure: IRRIGATION AND DEBRIDEMENT ANKLE;  Surgeon: Newt Minion, MD;  Location: [name];  Service: Orthopedics;  Laterality: Left;   I & D EXTREMITY Left 07/21/2022   Procedure: LEFT ANKLE DEBRIDEMENT;  Surgeon: Newt Minion, MD;  Location: [facility];  Service: Orthopedics;  Laterality: Left;   JOINT REPLACEMENT  04/12/2010   right knee replacement   TONSILLECTOMY     as child   TOTAL KNEE REVISION  11/20/2011   Procedure: TOTAL KNEE REVISION;  Surgeon: Mauri Pole, MD;  Location: [facility];  Service: Orthopedics;  Laterality: Right;  Right Total Knee Revision   Social History   Occupational History   Occupation: Retired    Fish farm manager: [employer]  Tobacco Use   Smoking status: Former    Packs/day: 1.00    Years: 20.00    Total pack years: 20.00    Types: Cigarettes    Quit date: 06/27/1980    Years since quitting: 42.1    Passive exposure: Past   Smokeless tobacco: Never  Vaping Use   Vaping Use: Never used  Substance and Sexual Activity   Alcohol use: No   Drug use: No   Sexual activity: Yes

## 2022-07-24 NOTE — Telephone Encounter (Signed)
Pt's wife called again and says that they do want to come in today and I will add them to the schedule.

## 2022-07-24 NOTE — Telephone Encounter (Signed)
Called pt, SW wife and pt on the phone. She originally asked for a new canister and then stated that he didn't need that, he needed a new machine b/c the charger is loose in the machine and is a bad connection at times. I asked for them to come into the office so we can address this and see if we can trouble shoot the problem. I cannot just hand out wound vac machines without seeing it first. The items are also very expensive. Patient refused to come into the office for [place] to look at it and said he will just hold it up for three days. Offered a sooner appt for him. Did not want to come in sooner and him and wife had ended the phone call.

## 2022-07-24 NOTE — Telephone Encounter (Signed)
Patient states the wound vac is off and how do they dispose of it

## 2022-07-26 ENCOUNTER — Telehealth: Payer: Self-pay

## 2022-07-26 NOTE — Telephone Encounter (Signed)
Spoke with Dr. Gale Journey regarding Heparin flush. Per provider continue this and picc maintenance like they have been doing. Bethena Roys verbalized understanding. Leatrice Jewels, RMA

## 2022-07-26 NOTE — Telephone Encounter (Signed)
Received call from [facility] nurse, [name], with [name] wanting to know if antibiotics were stopped. Relayed that Dr. Gale Journey is okay with stopping antibiotics early, but would like him to maintain the line until his appointment with [place] on 2/20. She will get last set of labs tomorrow.   Notified [name], RN with [employer].   Beryle Flock, RN

## 2022-07-26 NOTE — Telephone Encounter (Signed)
Called patient's wife back to inform her that provider is okay with stopping antibiotics early. Will continue to flush picc twice a day.  Is rescheduled to follow up on 2/20 at 230. Patient wife would like to know if she should continue with Heparin flushes. Will discuss with provider and call back. Leatrice Jewels, RMA

## 2022-07-26 NOTE — Telephone Encounter (Signed)
I agree that it would be ok to stop 2 days early. [name] Fernandez

## 2022-07-26 NOTE — Telephone Encounter (Signed)
Patient's wife called office to see if Dr. Gale Journey would be okay with patient stopping antibiotics today. States that patient has had loss of appetite and is barely eating. Yesterday patient was lethargic, but is feeling better today. Is supposed to be ending antibiotics 2/16. Will forward message to provider. Leatrice Jewels, RMA

## 2022-07-27 DIAGNOSIS — M86662 Other chronic osteomyelitis, left tibia and fibula: Secondary | ICD-10-CM | POA: Diagnosis not present

## 2022-07-27 DIAGNOSIS — L97329 Non-pressure chronic ulcer of left ankle with unspecified severity: Secondary | ICD-10-CM | POA: Diagnosis not present

## 2022-07-27 DIAGNOSIS — B965 Pseudomonas (aeruginosa) (mallei) (pseudomallei) as the cause of diseases classified elsewhere: Secondary | ICD-10-CM | POA: Diagnosis not present

## 2022-07-27 DIAGNOSIS — M65172 Other infective (teno)synovitis, left ankle and foot: Secondary | ICD-10-CM | POA: Diagnosis not present

## 2022-07-27 DIAGNOSIS — E1169 Type 2 diabetes mellitus with other specified complication: Secondary | ICD-10-CM | POA: Diagnosis not present

## 2022-07-27 DIAGNOSIS — E1122 Type 2 diabetes mellitus with diabetic chronic kidney disease: Secondary | ICD-10-CM | POA: Diagnosis not present

## 2022-07-27 DIAGNOSIS — I129 Hypertensive chronic kidney disease with stage 1 through stage 4 chronic kidney disease, or unspecified chronic kidney disease: Secondary | ICD-10-CM | POA: Diagnosis not present

## 2022-07-27 DIAGNOSIS — E11622 Type 2 diabetes mellitus with other skin ulcer: Secondary | ICD-10-CM | POA: Diagnosis not present

## 2022-07-27 DIAGNOSIS — N182 Chronic kidney disease, stage 2 (mild): Secondary | ICD-10-CM | POA: Diagnosis not present

## 2022-07-27 DIAGNOSIS — Z452 Encounter for adjustment and management of vascular access device: Secondary | ICD-10-CM | POA: Diagnosis not present

## 2022-07-27 NOTE — Telephone Encounter (Signed)
Patient's wife called office today to confirm how often they should flush picc line since stopping antibiotics. Per Dr. Gale Journey patient should continue to flush like he has been. If not able to do twice a day can do once a day.  Relayed this to his wife who verbalized understanding. Leatrice Jewels, RMA

## 2022-07-28 ENCOUNTER — Ambulatory Visit (INDEPENDENT_AMBULATORY_CARE_PROVIDER_SITE_OTHER): Payer: Medicare HMO | Admitting: Family

## 2022-07-28 ENCOUNTER — Telehealth: Payer: Self-pay | Admitting: Orthopedic Surgery

## 2022-07-28 ENCOUNTER — Encounter: Payer: Self-pay | Admitting: Family

## 2022-07-28 ENCOUNTER — Other Ambulatory Visit: Payer: Self-pay | Admitting: Orthopedic Surgery

## 2022-07-28 DIAGNOSIS — Z945 Skin transplant status: Secondary | ICD-10-CM

## 2022-07-28 DIAGNOSIS — S91002A Unspecified open wound, left ankle, initial encounter: Secondary | ICD-10-CM

## 2022-07-28 LAB — AEROBIC/ANAEROBIC CULTURE W GRAM STAIN (SURGICAL/DEEP WOUND)

## 2022-07-28 MED ORDER — SULFAMETHOXAZOLE-TRIMETHOPRIM 800-160 MG PO TABS: 1.0000 | ORAL_TABLET | Freq: Two times a day (BID) | ORAL | 0 refills | Status: DC

## 2022-07-28 NOTE — Telephone Encounter (Signed)
SW pt, he is coming in at 2 on 2/26

## 2022-07-28 NOTE — Telephone Encounter (Signed)
[name] Minion, MD  Leane Call C Patient just had a culture come back that was sensitive to the Bactrim DS.  Prescription was sent to his pharmacy for this.    I called and SW pt and his wife of the abx he will need to pick up at [facility] today and start taking. If he has issues with abx he will call us.

## 2022-07-28 NOTE — Telephone Encounter (Signed)
Pt had an appt today and PA Erin want him to see Dr Sharol Given on 2/26. He is available either 1 or 2. Please call pt with either 1 or 2. Pt phone number is [phone].

## 2022-07-28 NOTE — Progress Notes (Signed)
Post-Op Visit Note   Patient: [name] Fernandez           Date of Birth: [birth year]           MRN: [id number] Visit Date: 07/28/2022 PCP: Unk Pinto, MD  Chief Complaint:  Chief Complaint  Patient presents with   Left Ankle - Routine Post Op    07/21/2022 left ankle debridement    HPI:  HPI The patient is an 82 year old gentleman seen status post left ankle debridement with Kerecis placement wound VAC removed today. Ortho Exam On examination of the left lateral ankle there is a large ulceration with Kerecis in the wound bed this has not yet fully incorporated there is scant surrounding maceration there is no active drainage at this time no erythema or sign of infection Visit Diagnoses: No diagnosis found.  Plan: Will begin daily Dial soap cleansing around the wound [facility] over the [facility] with gauze over this will change out the gauze daily follow-up in 1 week  Follow-Up Instructions: No follow-ups on file.   Imaging: No results found.  Orders:  No orders of the defined types were placed in this encounter.  No orders of the defined types were placed in this encounter.    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[place]) 06/16/2022   Osteomyelitis ([place]) 06/16/2022   Tenosynovitis of tibialis posterior tendon 06/15/2022   Wound infection 06/15/2022   Chronic ulcer of left ankle ([place]) 06/14/2022   Anemia 03/13/2019   Chronic venous insufficiency 03/12/2019   CKD (chronic kidney disease) stage 2, 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/2015   COPD (chronic obstructive pulmonary disease) ([place]) 12/08/2015   Chronic pain syndrome 05/26/2015   DDD (degenerative disc disease), lumbar 05/26/2015   Medication management 05/26/2015   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   Lumbago with sciatica 08/06/2014   Hyperlipidemia 08/20/2013   Hypertension    Abnormal glucose    Vitamin D deficiency    Arthritis    Vitamin  B12 deficiency    Celiac disease 06/19/2012   GERD (gastroesophageal reflux disease) 04/30/2012   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 tube) procedure 04/30/2012   Hypothyroidism 04/30/2012   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   Past Medical History:  Diagnosis Date   Anemia    Arthritis    Celiac disease    diagnoses 06/24/12   Esophageal stricture    GERD (gastroesophageal reflux disease)    H/O hiatal hernia    Hiatal hernia    Hypertension    patient states " no current HTN"   Hypothyroidism    Prediabetes    Shortness of breath    from oxycodone at times   Vitamin B12 deficiency    Vitamin D deficiency     Family History  Problem Relation Age of Onset   Esophageal cancer Father     Past Surgical History:  Procedure Laterality Date   APPENDECTOMY     BACK SURGERY     3 diff surgeries for vertebrea broken   EYE SURGERY     bilateral cataract surgery   HERNIA REPAIR  02/10/1949   bilateral groin as child   HIATAL HERNIA REPAIR     I & D EXTREMITY Left 06/16/2022   Procedure: IRRIGATION AND DEBRIDEMENT ANKLE;  Surgeon: Newt Minion, MD;  Location: [place];  Service: Orthopedics;  Laterality: Left;   I & D EXTREMITY Left 07/21/2022   Procedure: LEFT ANKLE  DEBRIDEMENT;  Surgeon: Newt Minion, MD;  Location: [place];  Service: Orthopedics;  Laterality: Left;   JOINT REPLACEMENT  04/12/2010   right knee replacement   TONSILLECTOMY     as child   TOTAL KNEE REVISION  11/20/2011   Procedure: TOTAL KNEE REVISION;  Surgeon: Mauri Pole, MD;  Location[facility];  Service: Orthopedics;  Laterality: Right;  Right Total Knee Revision   Social History   Occupational History   Occupation: Retired    Fish farm manager: [employer]  Tobacco Use   Smoking status: Former    Packs/day: 1.00    Years: 20.00    Total pack years: 20.00    Types: Cigarettes    Quit date: 06/27/1980    Years since quitting: 42.1    Passive exposure: Past   Smokeless tobacco: Never   Vaping Use   Vaping Use: Never used  Substance and Sexual Activity   Alcohol use: No   Drug use: No   Sexual activity: Yes

## 2022-08-01 ENCOUNTER — Ambulatory Visit (INDEPENDENT_AMBULATORY_CARE_PROVIDER_SITE_OTHER): Payer: Medicare HMO | Admitting: Internal Medicine

## 2022-08-01 ENCOUNTER — Other Ambulatory Visit: Payer: Self-pay

## 2022-08-01 VITALS — BP 126/83 | HR 63 | Temp 97.5°F | Wt 170.0 lb

## 2022-08-01 DIAGNOSIS — M86062 Acute hematogenous osteomyelitis, left tibia and fibula: Secondary | ICD-10-CM | POA: Diagnosis not present

## 2022-08-01 DIAGNOSIS — L97929 Non-pressure chronic ulcer of unspecified part of left lower leg with unspecified severity: Secondary | ICD-10-CM

## 2022-08-01 DIAGNOSIS — T148XXA Other injury of unspecified body region, initial encounter: Secondary | ICD-10-CM

## 2022-08-01 DIAGNOSIS — M869 Osteomyelitis, unspecified: Secondary | ICD-10-CM

## 2022-08-01 DIAGNOSIS — I872 Venous insufficiency (chronic) (peripheral): Secondary | ICD-10-CM

## 2022-08-01 DIAGNOSIS — R21 Rash and other nonspecific skin eruption: Secondary | ICD-10-CM

## 2022-08-01 NOTE — Patient Instructions (Signed)
You can finish bactrim from dr Sharol Given   Will repeat blood tests in 3-4 weeks when you see me to assess if the bone infection is healed

## 2022-08-01 NOTE — Progress Notes (Signed)
PICC Removal    PICC length & location:  right brachial 34 cm Removed per verbal order from: Dr. Gale Journey  Blood thinners:  none Platelet count:  320 ([facility] 07/27/22)  Site assessment: Dressing clean and dry. Extremity warm and dry.. No redness, drainage, or swelling present at insertion site.   Pre-removal vital signs:  BP:  146/75 HR:  96 SpO2:  97% room air  Insertion site positioned below level of heart. No sutures present. Insertion site cleaned with CHG, catheter removed and petroleum dressing applied. Tip intact. Pressure held until hemostasis achieved.    Length of catheter removed:  34 cm   Provided patient with after care instructions and precautions print out (via [facility]). Reviewed this information with patient.   Patient verbalized understanding and agreement, all questions answered. Patient tolerated procedure well and remained in clinic under the care of RN 30 minutes post removal.  Post-observation vital signs:  BP:  126/83 HR:  63 SpO2:  96% room air  Notified [name], RN with [facility] and [facility] pharmacy team of removal.  Beryle Flock, RN

## 2022-08-01 NOTE — Progress Notes (Signed)
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 ([place]) 06/16/2022   Osteomyelitis ([place]) 06/16/2022   Tenosynovitis of tibialis posterior tendon 06/15/2022   Wound infection 06/15/2022   Chronic ulcer of left ankle ([place]) 06/14/2022   Anemia 03/13/2019   Chronic venous insufficiency 03/12/2019   CKD (chronic kidney disease) stage 2, 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/2015   COPD (chronic obstructive pulmonary disease) ([place]) 12/08/2015   Chronic pain syndrome 05/26/2015   DDD (degenerative disc disease), lumbar 05/26/2015   Medication management 05/26/2015   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   Lumbago with sciatica 08/06/2014   Hyperlipidemia 08/20/2013   Hypertension    Abnormal glucose    Vitamin D deficiency    Arthritis    Vitamin B12 deficiency    Celiac disease 06/19/2012   GERD (gastroesophageal reflux disease) 04/30/2012   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 tube) procedure 04/30/2012   Hypothyroidism 04/30/2012   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      Subjective:    Patient ID: [name] Fernandez, male    DOB: 20-Nov-1940, 82 y.o.   MRN: [id number]  Chief Complaint  Patient presents with   Follow-up    HPI:  [name] Fernandez is a 82 y.o. male celiac disease, gerd s/p nissen, venous insufficiency, chronic left ankle wound complicated by OM here for hospital f/u  He was admitted early 06/2022 He was previously followed by wound care Dr Sharol Given performed I&D on 06/16/21 with wound vac -- distal fibula was resected and sent for cx which grew cipro resistant pseudomonas  He was sent out on opat with cefepime until 07/13/22 for 6 weeks  07/13/22 id clinic visit: He was seen in clinic by dr Sharol Given on 1/22. Exam with 5x10 cm wound and 75% granulation tissue. Patient advised silvadine dressing with soap cleansing of wound. Plan to f/u and evaluate for tissue graft this coming  week    08/01/22 id f/u Patient finished cefepime about a week ago He had another debridement on 07/21/22 of the open wound -- I reviewed operating note --> dissection goes down to muscle not bone He grew achromobacter and was given bactrim by dr Sharol Given I reviewed serial pictures since surgery --he has some kind of skin graft material in there and there has been no cellulitis change or purulence since surgery He no longer used silvadene up to the 07/21/22 surgery No fever, chill A little burning sensation in the wound No diarrhea   Picc line still in -- no pain/redness there     Allergies  Allergen Reactions   Feraheme [Ferumoxytol] Swelling and Other (See Comments)    Swelling of lips and tongue, could not talk 30 minutes after the infusion.   Latex Other (See Comments)    Latex butterfly bandages tore off the skin and caused terrible blisters   Wound Dressing Adhesive Other (See Comments)    Latex butterfly bandages tore off the skin and caused terrible blisters   Levothyroxine Swelling and Other (See Comments)    Lip swelling   Doxycycline Nausea Only      Outpatient Medications Prior to Visit  Medication Sig Dispense Refill   acetaminophen (TYLENOL) 500 MG tablet Take 500-1,000 mg by mouth every 6 (six) hours as needed for mild pain or headache.     Ascorbic Acid (VITAMIN C) 1000 MG tablet Take 1,000 mg by mouth daily.  Cholecalciferol (VITAMIN D3) 125 MCG (5000 UT) TABS Take 5,000 Units by mouth 2 (two) times daily.     gabapentin (NEURONTIN) 600 MG tablet Take 1/2 to 1 tablet 2 to 3 x /Daily as needed for Chronic Pain (Patient taking differently: Take 300-600 mg by mouth See admin instructions. Take 300 mg by mouth at bedtime and an additional 600 mg up to twice a day as needed for pain (is allowed to take up to a sum total of 1,800 mg/day)) 270 tablet 1   hydrOXYzine (ATARAX) 25 MG tablet Take  1 tablet  3 x /day  as needed  for Anxiety or Sleep (Patient taking  differently: Take 25 mg by mouth at bedtime.) 270 tablet 1   omeprazole (PRILOSEC OTC) 20 MG tablet Take 20 mg by mouth daily before breakfast.     senna-docusate (SENOKOT-S) 8.6-50 MG tablet Take 2 tablets by mouth 2 (two) times daily. 30 tablet 0   silver sulfADIAZINE (SILVADENE) 1 % cream Apply 1 Application topically daily. Apply to affected area daily plus dry dressing 400 g 3   sulfamethoxazole-trimethoprim (BACTRIM DS) 800-160 MG tablet Take 1 tablet by mouth 2 (two) times daily. 30 tablet 0   fluticasone (FLONASE) 50 MCG/ACT nasal spray Place 1-2 sprays into both nostrils daily as needed for allergies or rhinitis. (Patient not taking: Reported on 07/13/2022)     NON FORMULARY Take 1 capsule by mouth See admin instructions. Thyroid Care Plus capsules- Take 1 capsule by mouth once a day (Patient not taking: Reported on 08/01/2022)     oxyCODONE-acetaminophen (PERCOCET/ROXICET) 5-325 MG tablet Take 1 tablet by mouth every 4 (four) hours as needed. (Patient not taking: Reported on 08/01/2022) 30 tablet 0   polyethylene glycol (MIRALAX / GLYCOLAX) 17 g packet Take 17 g by mouth daily. (Patient not taking: Reported on 07/13/2022) 14 each 0   torsemide (DEMADEX) 20 MG tablet Take  1 tablet  Daily for Fluid Retention / Leg Swelling (Patient not taking: Reported on 08/01/2022) 90 tablet 3   No facility-administered medications prior to visit.     Social History   Socioeconomic History   Marital status: Married    Spouse name: Not on file   Number of children: 2   Years of education: Not on file   Highest education level: Not on file  Occupational History   Occupation: Retired    Fish farm manager: [employer]  Tobacco Use   Smoking status: Former    Packs/day: 1.00    Years: 20.00    Total pack years: 20.00    Types: Cigarettes    Quit date: 06/27/1980    Years since quitting: 42.1    Passive exposure: Past   Smokeless tobacco: Never  Vaping Use   Vaping Use: Never used  Substance  and Sexual Activity   Alcohol use: No   Drug use: No   Sexual activity: Yes  Other Topics Concern   Not on file  Social History Narrative   Not on file   Social Determinants of Health   Financial Resource Strain: Not on file  Food Insecurity: No Food Insecurity (06/15/2022)   Hunger Vital Sign    Worried About Running Out of Food in the Last Year: Never true    Ran Out of Food in the Last Year: Never true  Transportation Needs: No Transportation Needs (06/15/2022)   PRAPARE - Hydrologist (Medical): No    Lack of Transportation (Non-Medical): No  Physical Activity: Not on file  Stress: Not on file  Social Connections: Not on file  Intimate Partner Violence: Not At Risk (06/15/2022)   Humiliation, Afraid, Rape, and Kick questionnaire    Fernandez of Current or Ex-Partner: No    Emotionally Abused: No    Physically Abused: No    Sexually Abused: No      Review of Systems    All other ros negative  Objective:    BP 126/74   Pulse 96   Temp (!) 97.5 F (36.4 C) (Oral)   Wt 170 lb (77.1 kg)   SpO2 97%   BMI 26.63 kg/m  Nursing note and vital signs reviewed.  Physical Exam     General/constitutional: no distress, pleasant HEENT: Normocephalic, PER, Conj Clear, EOMI, Oropharynx clear Neck supple CV: rrr no mrg Lungs: clear to auscultation, normal respiratory effort Abd: Soft, Nontender Ext: no edema Skin/msk:  Neuro: nonfocal     Labs: Opat labs reviewed 07/06/22 cbc 8/12/458 (eos 600); cr 1.06; no lft 06/29/22 cbc 8/12/365; cr 0.97  Esr/crp  07/27/22        crp 2  /    esr 38 06/15/22        crp 0.8   Micro:  Serology:  Imaging:  Assessment & Plan:   Problem List Items Addressed This Visit       Musculoskeletal and Integument   Osteomyelitis ([place]) - Primary   Other Visit Diagnoses     Open wound          Abx: bactrim 07/28/22-c (15 day course) cefepime 06/16/22 - 07/28/22  Left leg distal fib OM in setting venous  stasis ulcer, s/p I&D and partial resection 06/16/2022 (psA R cipro) Clinically improving   The oozing of white/beige from wound is likely related to silvadne's use (it didn't ooze until when he starts silvadne)  Crp down.   Rash likely not related to cefepime. Eos is slightly up so will monitor for worsening rash  Will seen end of 07/2022 (2 weeks after he finishes cefepime). Leave picc on until then (no oral options for pseudomonas)  F/u dr Sharol Given as advised by him   ----------- 08/01/22 id f/u Will get crp again today -- some confounding effect by open wound but hopefuly will see down trending with wound care, and this effectively will suggest bone infection cured as well  Had recent wound debridement superficially on 2/9 by dr Sharol Given and given bactrim for a bacteria achromobacter that grew. Planned 2 weeks by dr Sharol Given which would be more than sufficient   See me again in around 4 weeks and we can repeat labs at that time   I have spent a total of 40 minutes of face-to-face and non-face-to-face time, excluding clinical staff time, preparing to see patient, ordering tests and/or medications, and provide counseling the patient   Follow-up: Return in about 4 weeks (around 08/29/2022).      Jabier Mutton, [facility] 08/01/2022, 2:55 PM

## 2022-08-03 DIAGNOSIS — I129 Hypertensive chronic kidney disease with stage 1 through stage 4 chronic kidney disease, or unspecified chronic kidney disease: Secondary | ICD-10-CM | POA: Diagnosis not present

## 2022-08-03 DIAGNOSIS — M65172 Other infective (teno)synovitis, left ankle and foot: Secondary | ICD-10-CM | POA: Diagnosis not present

## 2022-08-03 DIAGNOSIS — E1169 Type 2 diabetes mellitus with other specified complication: Secondary | ICD-10-CM | POA: Diagnosis not present

## 2022-08-03 DIAGNOSIS — E11622 Type 2 diabetes mellitus with other skin ulcer: Secondary | ICD-10-CM | POA: Diagnosis not present

## 2022-08-03 DIAGNOSIS — M86662 Other chronic osteomyelitis, left tibia and fibula: Secondary | ICD-10-CM | POA: Diagnosis not present

## 2022-08-03 DIAGNOSIS — N182 Chronic kidney disease, stage 2 (mild): Secondary | ICD-10-CM | POA: Diagnosis not present

## 2022-08-03 DIAGNOSIS — L97329 Non-pressure chronic ulcer of left ankle with unspecified severity: Secondary | ICD-10-CM | POA: Diagnosis not present

## 2022-08-03 DIAGNOSIS — E1122 Type 2 diabetes mellitus with diabetic chronic kidney disease: Secondary | ICD-10-CM | POA: Diagnosis not present

## 2022-08-03 DIAGNOSIS — B965 Pseudomonas (aeruginosa) (mallei) (pseudomallei) as the cause of diseases classified elsewhere: Secondary | ICD-10-CM | POA: Diagnosis not present

## 2022-08-07 ENCOUNTER — Ambulatory Visit (INDEPENDENT_AMBULATORY_CARE_PROVIDER_SITE_OTHER): Payer: Medicare HMO | Admitting: Orthopedic Surgery

## 2022-08-07 DIAGNOSIS — S91002A Unspecified open wound, left ankle, initial encounter: Secondary | ICD-10-CM

## 2022-08-08 ENCOUNTER — Encounter: Payer: Self-pay | Admitting: Orthopedic Surgery

## 2022-08-08 NOTE — Progress Notes (Signed)
Office Visit Note   Patient: [name] Fernandez           Date of Birth: 07-07-1940           MRN: [id number] Visit Date: 08/07/2022              Requested by: Unk Pinto, [address] [place],  [place] [id number] PCP: Unk Pinto, MD  Chief Complaint  Patient presents with   Left Ankle - Routine Post Op    07/21/2022 left ankle debridement kerecis 7x10 and 38 micro with 1 g vanc       HPI: Patient is 3 weeks status post debridement lateral left ankle wound.  Patient had placement of vancomycin powder 1 g Kerecis tissue 7 x 10 cm and 38 cm of micro.  Assessment & Plan: Visit Diagnoses:  1. Ankle wound, left, initial encounter     Plan: Patient will proceed with Dial soap cleansing Silvadene dressing changes daily with an Ace compression wrap.  Recommended elevation minimize walking complete his antibiotics and use probiotics.  Follow-Up Instructions: Return in about 2 weeks (around 08/21/2022).   Ortho Exam  Patient is alert, oriented, no adenopathy, well-dressed, normal affect, normal respiratory effort. Examination there is improved wrinkling of the skin.  There is fibrinous tissue over the wound bed which appears to be unincorporated tissue graft.  Wound measures 10 x 5 cm there is no cellulitis there is superficial epithelialization around the wound edges.  Imaging: No results found.   Labs: Lab Results  Component Value Date   HGBA1C 6.4 (H) 04/04/2022   HGBA1C 5.9 (H) 09/14/2021   HGBA1C 5.8 (H) 03/16/2021   ESRSEDRATE 32 (H) 06/15/2022   ESRSEDRATE 40 (H) 06/14/2022   CRP 0.8 06/15/2022   CRP <0.5 06/14/2022   LABURIC 7.8 05/20/2014   LABURIC 6.8 05/20/2013   REPTSTATUS 07/28/2022 FINAL 07/21/2022   GRAMSTAIN  07/21/2022    FEW WBC PRESENT, PREDOMINANTLY PMN NO ORGANISMS SEEN    CULT  07/21/2022    RARE ACHROMOBACTER DENITRIFICANS NO ANAEROBES ISOLATED Performed at [facility], [place] [address] [place], [place]  [address]    [facility] ACHROMOBACTER DENITRIFICANS 07/21/2022     Lab Results  Component Value Date   ALBUMIN 2.9 (L) 06/16/2022   ALBUMIN 3.8 06/14/2022   ALBUMIN 4.0 09/25/2016   PREALBUMIN 18 06/14/2022    Lab Results  Component Value Date   MG 1.8 06/19/2022   MG 1.9 04/04/2022   MG 1.9 03/16/2021   Lab Results  Component Value Date   VD25OH 59 04/04/2022   VD25OH 44 09/28/2020   VD25OH 35 07/24/2019    Lab Results  Component Value Date   PREALBUMIN 18 06/14/2022      Latest Ref Rng & Units 06/20/2022    3:24 AM 06/18/2022    2:19 AM 06/17/2022   10:33 AM  CBC EXTENDED  WBC 4.0 - 10.5 K/uL 8.4  9.1  9.8   RBC 4.22 - 5.81 MIL/uL 3.44  3.33  3.43    3.49   Hemoglobin 13.0 - 17.0 g/dL 11.2  10.6  11.3   HCT 39.0 - 52.0 % 31.8  31.4  32.5   Platelets 150 - 400 K/uL 304  344  356      There is no height or weight on file to calculate BMI.  Orders:  No orders of the defined types were placed in this encounter.  No orders of the defined types were placed  in this encounter.    Procedures: No procedures performed  Clinical Data: No additional findings.  ROS:  All other systems negative, except as noted in the HPI. Review of Systems  Objective: Vital Signs: There were no vitals taken for this visit.  Specialty Comments:  No specialty comments available.  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[place]) 06/16/2022   Osteomyelitis ([place]) 06/16/2022   Tenosynovitis of tibialis posterior tendon 06/15/2022   Wound infection 06/15/2022   Chronic ulcer of left ankle ([place]) 06/14/2022   Anemia 03/13/2019   Chronic venous insufficiency 03/12/2019   CKD (chronic kidney disease) stage 2, 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/2015   COPD (chronic obstructive pulmonary disease) ([place]) 12/08/2015   Chronic pain syndrome 05/26/2015   DDD (degenerative disc disease), lumbar 05/26/2015   Medication management  05/26/2015   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   Lumbago with sciatica 08/06/2014   Hyperlipidemia 08/20/2013   Hypertension    Abnormal glucose    Vitamin D deficiency    Arthritis    Vitamin B12 deficiency    Celiac disease 06/19/2012   GERD (gastroesophageal reflux disease) 04/30/2012   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 tube) procedure 04/30/2012   Hypothyroidism 04/30/2012   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   Past Medical History:  Diagnosis Date   Anemia    Arthritis    Celiac disease    diagnoses 06/24/12   Esophageal stricture    GERD (gastroesophageal reflux disease)    H/O hiatal hernia    Hiatal hernia    Hypertension    patient states " no current HTN"   Hypothyroidism    Prediabetes    Shortness of breath    from oxycodone at times   Vitamin B12 deficiency    Vitamin D deficiency     Family History  Problem Relation Age of Onset   Esophageal cancer Father     Past Surgical History:  Procedure Laterality Date   APPENDECTOMY     BACK SURGERY     3 diff surgeries for vertebrea broken   EYE SURGERY     bilateral cataract surgery   HERNIA REPAIR  02/10/1949   bilateral groin as child   HIATAL HERNIA REPAIR     I & D EXTREMITY Left 06/16/2022   Procedure: IRRIGATION AND DEBRIDEMENT ANKLE;  Surgeon: Newt Minion, MD;  Location: [place];  Service: Orthopedics;  Laterality: Left;   I & D EXTREMITY Left 07/21/2022   Procedure: LEFT ANKLE DEBRIDEMENT;  Surgeon: Newt Minion, MD;  Location: [place];  Service: Orthopedics;  Laterality: Left;   JOINT REPLACEMENT  04/12/2010   right knee replacement   TONSILLECTOMY     as child   TOTAL KNEE REVISION  11/20/2011   Procedure: TOTAL KNEE REVISION;  Surgeon: Mauri Pole, MD;  Location: [facility];  Service: Orthopedics;  Laterality: Right;  Right Total Knee Revision   Social History   Occupational History   Occupation: Retired    Fish farm manager: [employer]  Tobacco Use   Smoking status: Former     Packs/day: 1.00    Years: 20.00    Total pack years: 20.00    Types: Cigarettes    Quit date: 06/27/1980    Years since quitting: 42.1    Passive exposure: Past   Smokeless tobacco: Never  Vaping Use   Vaping Use: Never used  Substance and Sexual Activity  Alcohol use: No   Drug use: No   Sexual activity: Yes

## 2022-08-09 ENCOUNTER — Ambulatory Visit: Payer: Medicare HMO | Admitting: Internal Medicine

## 2022-08-15 ENCOUNTER — Telehealth: Payer: Self-pay | Admitting: *Deleted

## 2022-08-15 NOTE — Progress Notes (Signed)
  Care Coordination   Note   08/15/2022 Name: NAPOLEON YERENA MRN: [id number] DOB: Apr 16, 1941  KILIAN HEWITSON is a 82 y.o. year old male who sees Unk Pinto, MD for primary care. I reached out to [name] by phone today to offer care coordination services.  Mr. Coombe was given information about Care Coordination services today including:   The Care Coordination services include support from the care team which includes your Nurse Coordinator, Clinical Social Worker, or Pharmacist.  The Care Coordination team is here to help remove barriers to the health concerns and goals most important to you. Care Coordination services are voluntary, and the patient may decline or stop services at any time by request to their care team member.   Care Coordination Consent Status: Patient agreed to services and verbal consent obtained.   Follow up plan:  Telephone appointment with care coordination team member scheduled for:  08/18/22  Encounter Outcome:  Pt. Scheduled  [place]  Direct Dial: [phone]

## 2022-08-18 NOTE — Progress Notes (Signed)
  Care Coordination   Note   08/18/2022 Name: [name] Fernandez MRN: [id number] DOB: 1940-07-20  [name] Fernandez is a 82 y.o. year old male who sees Unk Pinto, MD for primary care. I reached out to [name] by phone today to offer care coordination services.  [name] Fernandez was given information about Care Coordination services today including:   The Care Coordination services include support from the care team which includes your Nurse Coordinator, Clinical Social Worker, or Pharmacist.  The Care Coordination team is here to help remove barriers to the health concerns and goals most important to you. Care Coordination services are voluntary, and the patient may decline or stop services at any time by request to their care team member.   Care Coordination Consent Status: Patient did not agree to participate in care coordination services at this time.  Follow up plan:  none indicated at this time - has multiple upcoming provider appointments   Encounter Outcome:  Pt. Refused  Julian Hy, [place] Direct Dial: [phone]

## 2022-08-22 ENCOUNTER — Ambulatory Visit (INDEPENDENT_AMBULATORY_CARE_PROVIDER_SITE_OTHER): Payer: Medicare HMO | Admitting: Orthopedic Surgery

## 2022-08-22 DIAGNOSIS — S91002A Unspecified open wound, left ankle, initial encounter: Secondary | ICD-10-CM

## 2022-08-25 ENCOUNTER — Telehealth: Payer: Self-pay | Admitting: Orthopedic Surgery

## 2022-08-25 NOTE — Telephone Encounter (Signed)
Patient asking about his wound care supplies,states he has not received anythig yet and he is going to need them soon. Please advise

## 2022-08-25 NOTE — Telephone Encounter (Signed)
Pending. Need signature from provider and I will fax to [employer]. He said he would have enough through the weekend.

## 2022-08-28 NOTE — Telephone Encounter (Signed)
Signed and faxing to [employer].

## 2022-09-01 ENCOUNTER — Telehealth: Payer: Self-pay | Admitting: Orthopedic Surgery

## 2022-09-01 DIAGNOSIS — S91001A Unspecified open wound, right ankle, initial encounter: Secondary | ICD-10-CM | POA: Diagnosis not present

## 2022-09-01 NOTE — Telephone Encounter (Signed)
Patient called. Says he has not received his medical supplies. Would like [place] to call him. [phone]

## 2022-09-01 NOTE — Telephone Encounter (Signed)
SW pt, he is coming by to pick up some gauze rolls at our office. I have left those at the front desk. I called [employer] for him and check the status of the supplies. They are in the final status, at warehouse waiting on [employer] to pick up and deliver. Stated early next week.

## 2022-09-04 ENCOUNTER — Encounter: Payer: Self-pay | Admitting: Orthopedic Surgery

## 2022-09-04 ENCOUNTER — Ambulatory Visit (INDEPENDENT_AMBULATORY_CARE_PROVIDER_SITE_OTHER): Payer: Medicare HMO | Admitting: Orthopedic Surgery

## 2022-09-04 DIAGNOSIS — S91002A Unspecified open wound, left ankle, initial encounter: Secondary | ICD-10-CM

## 2022-09-04 DIAGNOSIS — Z945 Skin transplant status: Secondary | ICD-10-CM

## 2022-09-04 NOTE — Progress Notes (Addendum)
Office Visit Note   Patient: [name] Fernandez           Date of Birth: 08-13-1940           MRN: [id number] Visit Date: 08/22/2022              Requested by: Unk Pinto, [address] [place],  [place] [address] PCP: Unk Pinto, MD  Chief Complaint  Patient presents with   Left Ankle - Routine Post Op    07/21/2022 left ankle debridement kerecis 7x10 and 38 micro with 1 g vanc      HPI: Patient is an 82 year old gentleman who is 4 weeks status post left ankle debridement and application of Kerecis tissue graft.  He is currently doing Silvadene dressing changes.  Patient has completed his antibiotics and probiotics.  Assessment & Plan: Visit Diagnoses:  1. Ankle wound, left, initial encounter     Plan: Continue current wound care with compression.  Follow-Up Instructions: Return in about 2 weeks (around 09/05/2022).   Ortho Exam  Patient is alert, oriented, no adenopathy, well-dressed, normal affect, normal respiratory effort. Examination there is no cellulitis.  There is granulation tissue in the wound bed there is venous swelling. The wound measures 5 x 9 cm in its largest dimensions.   Imaging: No results found.   Labs: Lab Results  Component Value Date   HGBA1C 6.4 (H) 04/04/2022   HGBA1C 5.9 (H) 09/14/2021   HGBA1C 5.8 (H) 03/16/2021   ESRSEDRATE 32 (H) 06/15/2022   ESRSEDRATE 40 (H) 06/14/2022   CRP 0.8 06/15/2022   CRP <0.5 06/14/2022   LABURIC 7.8 05/20/2014   LABURIC 6.8 05/20/2013   REPTSTATUS 07/28/2022 FINAL 07/21/2022   GRAMSTAIN  07/21/2022    FEW WBC PRESENT, PREDOMINANTLY PMN NO ORGANISMS SEEN    CULT  07/21/2022    RARE ACHROMOBACTER DENITRIFICANS NO ANAEROBES ISOLATED Performed at [facility], [place] [address] [place], [place] [address]    [facility] ACHROMOBACTER DENITRIFICANS 07/21/2022     Lab Results  Component Value Date   ALBUMIN 2.9 (L) 06/16/2022   ALBUMIN 3.8 06/14/2022   ALBUMIN 4.0  09/25/2016   PREALBUMIN 18 06/14/2022    Lab Results  Component Value Date   MG 1.8 06/19/2022   MG 1.9 04/04/2022   MG 1.9 03/16/2021   Lab Results  Component Value Date   VD25OH 59 04/04/2022   VD25OH 44 09/28/2020   VD25OH 35 07/24/2019    Lab Results  Component Value Date   PREALBUMIN 18 06/14/2022      Latest Ref Rng & Units 06/20/2022    3:24 AM 06/18/2022    2:19 AM 06/17/2022   10:33 AM  CBC EXTENDED  WBC 4.0 - 10.5 K/uL 8.4  9.1  9.8   RBC 4.22 - 5.81 MIL/uL 3.44  3.33  3.43    3.49   Hemoglobin 13.0 - 17.0 g/dL 11.2  10.6  11.3   HCT 39.0 - 52.0 % 31.8  31.4  32.5   Platelets 150 - 400 K/uL 304  344  356      There is no height or weight on file to calculate BMI.  Orders:  No orders of the defined types were placed in this encounter.  No orders of the defined types were placed in this encounter.    Procedures: No procedures performed  Clinical Data: No additional findings.  ROS:  All other systems negative, except as noted in the HPI. Review  of Systems  Objective: Vital Signs: There were no vitals taken for this visit.  Specialty Comments:  No specialty comments available.  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[place]) 06/16/2022   Osteomyelitis ([place]) 06/16/2022   Tenosynovitis of tibialis posterior tendon 06/15/2022   Wound infection 06/15/2022   Chronic ulcer of left ankle ([place]) 06/14/2022   Anemia 03/13/2019   Chronic venous insufficiency 03/12/2019   CKD (chronic kidney disease) stage 2, 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/2015   COPD (chronic obstructive pulmonary disease) ([place]) 12/08/2015   Chronic pain syndrome 05/26/2015   DDD (degenerative disc disease), lumbar 05/26/2015   Medication management 05/26/2015   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   Lumbago with sciatica 08/06/2014   Hyperlipidemia 08/20/2013   Hypertension    Abnormal glucose    Vitamin D  deficiency    Arthritis    Vitamin B12 deficiency    Celiac disease 06/19/2012   GERD (gastroesophageal reflux disease) 04/30/2012   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 tube) procedure 04/30/2012   Hypothyroidism 04/30/2012   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   Past Medical History:  Diagnosis Date   Anemia    Arthritis    Celiac disease    diagnoses 06/24/12   Esophageal stricture    GERD (gastroesophageal reflux disease)    H/O hiatal hernia    Hiatal hernia    Hypertension    patient states " no current HTN"   Hypothyroidism    Prediabetes    Shortness of breath    from oxycodone at times   Vitamin B12 deficiency    Vitamin D deficiency     Family History  Problem Relation Age of Onset   Esophageal cancer Father     Past Surgical History:  Procedure Laterality Date   APPENDECTOMY     BACK SURGERY     3 diff surgeries for vertebrea broken   EYE SURGERY     bilateral cataract surgery   HERNIA REPAIR  02/10/1949   bilateral groin as child   HIATAL HERNIA REPAIR     I & D EXTREMITY Left 06/16/2022   Procedure: IRRIGATION AND DEBRIDEMENT ANKLE;  Surgeon: Newt Minion, MD;  Location: [facility];  Service: Orthopedics;  Laterality: Left;   I & D EXTREMITY Left 07/21/2022   Procedure: LEFT ANKLE DEBRIDEMENT;  Surgeon: Newt Minion, MD;  Location: [facility];  Service: Orthopedics;  Laterality: Left;   JOINT REPLACEMENT  04/12/2010   right knee replacement   TONSILLECTOMY     as child   TOTAL KNEE REVISION  11/20/2011   Procedure: TOTAL KNEE REVISION;  Surgeon: Mauri Pole, MD;  Location: [facility];  Service: Orthopedics;  Laterality: Right;  Right Total Knee Revision   Social History   Occupational History   Occupation: Retired    Fish farm manager: [employer]  Tobacco Use   Smoking status: Former    Packs/day: 1.00    Years: 20.00    Additional pack years: 0.00    Total pack years: 20.00    Types: Cigarettes    Quit date: 06/27/1980    Years since  quitting: 42.2    Passive exposure: Past   Smokeless tobacco: Never  Vaping Use   Vaping Use: Never used  Substance and Sexual Activity   Alcohol use: No   Drug use: No   Sexual activity: Yes

## 2022-09-05 ENCOUNTER — Encounter: Payer: Self-pay | Admitting: Internal Medicine

## 2022-09-05 ENCOUNTER — Other Ambulatory Visit: Payer: Self-pay

## 2022-09-05 ENCOUNTER — Ambulatory Visit: Payer: Medicare HMO | Admitting: Internal Medicine

## 2022-09-05 VITALS — BP 123/74 | HR 67 | Resp 16 | Ht 67.0 in | Wt 177.4 lb

## 2022-09-05 DIAGNOSIS — L0390 Cellulitis, unspecified: Secondary | ICD-10-CM | POA: Diagnosis not present

## 2022-09-05 DIAGNOSIS — T148XXA Other injury of unspecified body region, initial encounter: Secondary | ICD-10-CM | POA: Diagnosis not present

## 2022-09-05 DIAGNOSIS — R21 Rash and other nonspecific skin eruption: Secondary | ICD-10-CM | POA: Diagnosis not present

## 2022-09-05 MED ORDER — BETAMETHASONE VALERATE 0.1 % EX OINT: 1.0000 | TOPICAL_OINTMENT | Freq: Two times a day (BID) | CUTANEOUS | 2 refills | Status: DC

## 2022-09-05 NOTE — Addendum Note (Signed)
Addended byPrudencio Pair T on: 09/05/2022 11:25 AM   Modules accepted: Orders

## 2022-09-05 NOTE — Patient Instructions (Signed)
Your wound is healing and no sign of infection  You don't need to follow up with [place]  But if any concern for deep infection let us know

## 2022-09-05 NOTE — Progress Notes (Addendum)
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) 06/14/2022   Anemia 03/13/2019   Chronic venous insufficiency 03/12/2019   CKD (chronic kidney disease) stage 2, 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/2015   COPD (chronic obstructive pulmonary disease) ([place]) 12/08/2015   Chronic pain syndrome 05/26/2015   DDD (degenerative disc disease), lumbar 05/26/2015   Medication management 05/26/2015   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   Lumbago with sciatica 08/06/2014   Hyperlipidemia 08/20/2013   Hypertension    Abnormal glucose    Vitamin D deficiency    Arthritis    Vitamin B12 deficiency    Celiac disease 06/19/2012   GERD (gastroesophageal reflux disease) 04/30/2012   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 tube) procedure 04/30/2012   Hypothyroidism 04/30/2012   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   MRN: [id number]  Chief Complaint  Patient presents with   Follow-up    HPI:  UNKOWN DEFRONZO is a 82 y.o. male celiac disease, gerd s/p nissen, venous insufficiency, chronic left ankle wound complicated by OM here for hospital f/u  He was admitted early 06/2022 He was previously followed by wound care Dr Sharol Given performed I&D on 06/16/21 with wound vac -- distal fibula was resected and sent for cx which grew cipro resistant pseudomonas  He was sent out on opat with cefepime until 07/13/22 for 6 weeks  07/13/22 id clinic visit: He was seen in clinic by dr Sharol Given on 1/22. Exam with 5x10 cm wound and 75% granulation tissue. Patient advised silvadine dressing with soap cleansing of wound. Plan to f/u and evaluate for tissue graft this coming  week    08/01/22 id f/u Patient finished cefepime about a week ago He had another debridement on 07/21/22 of the open wound -- I reviewed operating note --> dissection goes down to muscle not bone He grew achromobacter and was given bactrim by dr Sharol Given I reviewed serial pictures since surgery --he has some kind of skin graft material in there and there has been no cellulitis change or purulence since surgery He no longer used silvadene up to the 07/21/22 surgery No fever, chill A little burning sensation in the wound No diarrhea   Picc line still in -- no pain/redness there   09/05/22 id clinic f/u Saw dr duda 3/12. Still open wound but appears healthy. Doing silvadene with dressing change. Will f/u in 2 weeks with him Off bactrim for a month now Felt well  Allergies  Allergen Reactions   Feraheme [Ferumoxytol] Swelling and Other (See Comments)    Swelling of lips and tongue, could not talk 30 minutes after the infusion.   Latex Other (See Comments)    Latex butterfly bandages tore off the skin and caused terrible blisters   Wound Dressing Adhesive Other (See Comments)    Latex butterfly bandages tore off the skin and caused terrible blisters   Levothyroxine Swelling and Other (See Comments)    Lip swelling   Doxycycline Nausea Only      Outpatient Medications Prior to Visit  Medication Sig Dispense Refill   acetaminophen (TYLENOL) 500 MG tablet Take 500-1,000  mg by mouth every 6 (six) hours as needed for mild pain or headache.     Ascorbic Acid (VITAMIN C) 1000 MG tablet Take 1,000 mg by mouth daily.     Cholecalciferol (VITAMIN D3) 125 MCG (5000 UT) TABS Take 5,000 Units by mouth 2 (two) times daily.     gabapentin (NEURONTIN) 600 MG tablet Take 1/2 to 1 tablet 2 to 3 x /Daily as needed for Chronic Pain 270 tablet 1   hydrOXYzine (ATARAX) 25 MG tablet Take  1 tablet  3 x /day  as needed  for Anxiety or Sleep (Patient taking differently: Take 25 mg by mouth at bedtime.) 270  tablet 1   Ibuprofen (ADVIL) 200 MG CAPS Take by mouth.     omeprazole (PRILOSEC OTC) 20 MG tablet Take 20 mg by mouth daily before breakfast.     senna-docusate (SENOKOT-S) 8.6-50 MG tablet Take 2 tablets by mouth 2 (two) times daily. 30 tablet 0   silver sulfADIAZINE (SILVADENE) 1 % cream Apply 1 Application topically daily. Apply to affected area daily plus dry dressing 400 g 3   NON FORMULARY Take 1 capsule by mouth See admin instructions. Thyroid Care Plus capsules- Take 1 capsule by mouth once a day (Patient not taking: Reported on 08/01/2022)     oxyCODONE-acetaminophen (PERCOCET/ROXICET) 5-325 MG tablet Take 1 tablet by mouth every 4 (four) hours as needed. (Patient not taking: Reported on 09/05/2022) 30 tablet 0   polyethylene glycol (MIRALAX / GLYCOLAX) 17 g packet Take 17 g by mouth daily. (Patient not taking: Reported on 07/13/2022) 14 each 0   sulfamethoxazole-trimethoprim (BACTRIM DS) 800-160 MG tablet Take 1 tablet by mouth 2 (two) times daily. (Patient not taking: Reported on 09/05/2022) 30 tablet 0   torsemide (DEMADEX) 20 MG tablet Take  1 tablet  Daily for Fluid Retention / Leg Swelling (Patient not taking: Reported on 08/01/2022) 90 tablet 3   fluticasone (FLONASE) 50 MCG/ACT nasal spray Place 1-2 sprays into both nostrils daily as needed for allergies or rhinitis. (Patient not taking: Reported on 09/05/2022)     No facility-administered medications prior to visit.     Social History   Socioeconomic History   Marital status: Married    Spouse name: Not on file   Number of children: 2   Years of education: Not on file   Highest education level: Not on file  Occupational History   Occupation: Retired    Fish farm manager: [employer]  Tobacco Use   Smoking status: Former    Packs/day: 1.00    Years: 20.00    Additional pack years: 0.00    Total pack years: 20.00    Types: Cigarettes    Quit date: 06/27/1980    Years since quitting: 42.2    Passive exposure: Past    Smokeless tobacco: Never  Vaping Use   Vaping Use: Never used  Substance and Sexual Activity   Alcohol use: No   Drug use: No   Sexual activity: Yes  Other Topics Concern   Not on file  Social History Narrative   Not on file   Social Determinants of Health   Financial Resource Strain: Not on file  Food Insecurity: No Food Insecurity (06/15/2022)   Hunger Vital Sign    Worried About Running Out of Food in the Last Year: Never true    Ran Out of Food in the Last Year: Never true  Transportation Needs: No Transportation Needs (06/15/2022)   [place] - Transportation  Lack of Transportation (Medical): No    Lack of Transportation (Non-Medical): No  Physical Activity: Not on file  Stress: Not on file  Social Connections: Not on file  Intimate Partner Violence: Not At Risk (06/15/2022)   Humiliation, Afraid, Rape, and Kick questionnaire    Fernandez of Current or Ex-Partner: No    Emotionally Abused: No    Physically Abused: No    Sexually Abused: No      Review of Systems    All other ros negative  Objective:    BP 123/74   Pulse 67   Resp 16   Ht 5\' 7"  (1.702 m)   Wt 177 lb 6.4 oz (80.5 kg)   SpO2 97%   BMI 27.78 kg/m  Nursing note and vital signs reviewed.  Physical Exam     General/constitutional: no distress, pleasant HEENT: Normocephalic, PER, Conj Clear, EOMI, Oropharynx clear Neck supple CV: rrr no mrg Lungs: clear to auscultation, normal respiratory effort Abd: Soft, Nontender Ext: no edema Skin: I reviewed picture from yesterday. Lateral malleolus open wound but very healthy appearing -- per patient/dr Sharol Given granulating well      Labs: Opat labs reviewed 07/06/22 cbc 8/12/458 (eos 600); cr 1.06; no lft 06/29/22 cbc 8/12/365; cr 0.97  Esr/crp  07/27/22        crp 2  /    esr 38 06/15/22        crp 0.8   Micro:  Serology:  Imaging:  Assessment & Plan:   Problem List Items Addressed This Visit   None Visit Diagnoses     Cellulitis,  unspecified cellulitis site    -  Primary   Open wound         Abx: bactrim 07/28/22-08/10/22 (15 day course) cefepime 06/16/22 - 07/28/22  Left leg distal fib OM in setting venous stasis ulcer, s/p I&D and partial resection 06/16/2022 (psA R cipro) Clinically improving   The oozing of white/beige from wound is likely related to silvadne's use (it didn't ooze until when he starts silvadne)  Crp down.   Rash likely not related to cefepime. Eos is slightly up so will monitor for worsening rash  Will seen end of 07/2022 (2 weeks after he finishes cefepime). Leave picc on until then (no oral options for pseudomonas)  F/u dr Sharol Given as advised by him   ----------- 08/01/22 id f/u Will get crp again today -- some confounding effect by open wound but hopefuly will see down trending with wound care, and this effectively will suggest bone infection cured as well  Had recent wound debridement superficially on 2/9 by dr Sharol Given and given bactrim for a bacteria achromobacter that grew. Planned 2 weeks by dr Sharol Given which would be more than sufficient   See me again in around 4 weeks and we can repeat labs at that time    09/05/22 assessment Doing well No sign of infection Open wound healthy appearing/healing  F/u dr Tonia Brooms care No need to f/u id clinic further   Patient also questioned about psoriasis/rash and wonders if something to use - clobetasol trial -- f/u pcp  I have spent a total of 30 minutes of face-to-face and non-face-to-face time, excluding clinical staff time, preparing to see patient, ordering tests and/or medications, and provide counseling the patient   Follow-up: No follow-ups on file.      Jabier Mutton, [facility] for Infectious Disease [facility] 09/05/2022, 11:14 AM

## 2022-09-11 ENCOUNTER — Telehealth: Payer: Self-pay | Admitting: Orthopedic Surgery

## 2022-09-11 NOTE — Telephone Encounter (Signed)
Pt called stating [employer] sent in orders for supplies for his wound. Pt states the company is asking for wound size for his bandages, gauze and wrap. Please call pt about this matter at [phone]. Pt state this is urgent due to him WRAPPING HIS LEG WITH TOWEL AND USING TISSUE TO WHIP WOUND.

## 2022-09-11 NOTE — Telephone Encounter (Signed)
Message to Marlow Baars with [employer] to check the status of this order.

## 2022-09-12 ENCOUNTER — Telehealth: Payer: Self-pay | Admitting: Orthopedic Surgery

## 2022-09-12 NOTE — Telephone Encounter (Signed)
Duplicate message. 

## 2022-09-12 NOTE — Telephone Encounter (Signed)
Please call [facility] in reference to Patient asking for more supplies and they need conformation of wound area.[name].

## 2022-09-12 NOTE — Telephone Encounter (Signed)
I called and sw pt advised that I spoke with [employer] and they have a confirmation number from [employer] that the supplies were delivered on 09/05/2022. The pt then confirmed that yes he did receive this but that he wanted 4x4 gauze and a kerlix roll. He would like a clal back to his number [phone]. I am not sure if this was included in the order for delivery. Do you know?

## 2022-09-12 NOTE — Telephone Encounter (Signed)
SW [employer]. Patient received the 4 x 4 gauze on 09/01/22, he was given the adequate amount her the size of his wound and drainage reported. Rep told me that pt told them this used to be 4 different wounds and stated if we had measurements of these 4 wounds within the last 30 days then they could work something out. I told her that we do not have measurements as he underwent ankle debridement on 07/21/22 and it is one big wound. She noted that and stated that she will call the patient to tell him this. It is an insurance issue as they will not approve of anymore supplies until 30 days from previous shipment.

## 2022-09-12 NOTE — Telephone Encounter (Signed)
Please call [name] Fernandez in reference to Patient asking for more supplies and they need conformation of wound area.[name].

## 2022-09-17 ENCOUNTER — Encounter: Payer: Self-pay | Admitting: Orthopedic Surgery

## 2022-09-17 NOTE — Progress Notes (Signed)
Office Visit Note   Patient: [name] Fernandez           Date of Birth: 20-May-1941           MRN: [id number] Visit Date: 09/04/2022              Requested by: Lucky Cowboy, MD [address] [place],  [place] [address] PCP: Lucky Cowboy, MD  Chief Complaint  Patient presents with   Left Ankle - Routine Post Op    07/21/2022 left ankle debridement kerecis 7x10 and 38 micro with 1 g vanc      HPI: Patient is an 82 year old gentleman who presents status post left ankle debridement with Kerecis tissue graft.  Patient currently undergoing Silvadene dressing changes and an Ace wrap.  He is on antibiotics and probiotics.  Assessment & Plan: Visit Diagnoses: No diagnosis found.  Plan: Continue Dial soap cleansing dressing change plus control pression and Silvadene.  Patient states he will try to get the [facility]  Follow-Up Instructions: Return in about 2 weeks (around 09/18/2022).   Ortho Exam  Patient is alert, oriented, no adenopathy, well-dressed, normal affect, normal respiratory effort. Examination the wound bed is 6 x 9 cm in its largest dimension.  Lateral foot and ankle.  There is no cellulitis there is venous stasis swelling.  90% healthy granulation tissue.  Imaging: No results found.   Labs: Lab Results  Component Value Date   HGBA1C 6.4 (H) 04/04/2022   HGBA1C 5.9 (H) 09/14/2021   HGBA1C 5.8 (H) 03/16/2021   ESRSEDRATE 32 (H) 06/15/2022   ESRSEDRATE 40 (H) 06/14/2022   CRP 0.8 06/15/2022   CRP <0.5 06/14/2022   LABURIC 7.8 05/20/2014   LABURIC 6.8 05/20/2013   REPTSTATUS 07/28/2022 FINAL 07/21/2022   GRAMSTAIN  07/21/2022    FEW WBC PRESENT, PREDOMINANTLY PMN NO ORGANISMS SEEN    CULT  07/21/2022    RARE ACHROMOBACTER DENITRIFICANS NO ANAEROBES ISOLATED Performed at [facility], [address] [place], [place] [address]    [facility] ACHROMOBACTER DENITRIFICANS 07/21/2022     Lab Results  Component Value Date   ALBUMIN  2.9 (L) 06/16/2022   ALBUMIN 3.8 06/14/2022   ALBUMIN 4.0 09/25/2016   PREALBUMIN 18 06/14/2022    Lab Results  Component Value Date   MG 1.8 06/19/2022   MG 1.9 04/04/2022   MG 1.9 03/16/2021   Lab Results  Component Value Date   VD25OH 59 04/04/2022   VD25OH 44 09/28/2020   VD25OH 35 07/24/2019    Lab Results  Component Value Date   PREALBUMIN 18 06/14/2022      Latest Ref Rng & Units 06/20/2022    3:24 AM 06/18/2022    2:19 AM 06/17/2022   10:33 AM  CBC EXTENDED  WBC 4.0 - 10.5 K/uL 8.4  9.1  9.8   RBC 4.22 - 5.81 MIL/uL 3.44  3.33  3.43    3.49   Hemoglobin 13.0 - 17.0 g/dL 97.4  16.3  84.5   HCT 39.0 - 52.0 % 31.8  31.4  32.5   Platelets 150 - 400 K/uL 304  344  356      There is no height or weight on file to calculate BMI.  Orders:  No orders of the defined types were placed in this encounter.  No orders of the defined types were placed in this encounter.    Procedures: No procedures performed  Clinical Data: No additional findings.  ROS:  All  other systems negative, except as noted in the HPI. Review of Systems  Objective: Vital Signs: There were no vitals taken for this visit.  Specialty Comments:  No specialty comments available.  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 06/16/2022   Osteomyelitis 06/16/2022   Tenosynovitis of tibialis posterior tendon 06/15/2022   Wound infection 06/15/2022   Chronic ulcer of left ankle 06/14/2022   Anemia 03/13/2019   Chronic venous insufficiency 03/12/2019   CKD (chronic kidney disease) stage 2, 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/2015   COPD (chronic obstructive pulmonary disease) 12/08/2015   Chronic pain syndrome 05/26/2015   DDD (degenerative disc disease), lumbar 05/26/2015   Medication management 05/26/2015   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   Lumbago with sciatica 08/06/2014   Hyperlipidemia 08/20/2013   Hypertension     Abnormal glucose    Vitamin D deficiency    Arthritis    Vitamin B12 deficiency    Celiac disease 06/19/2012   GERD (gastroesophageal reflux disease) 04/30/2012   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 tube) procedure 04/30/2012   Hypothyroidism 04/30/2012   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   Past Medical History:  Diagnosis Date   Anemia    Arthritis    Celiac disease    diagnoses 06/24/12   Esophageal stricture    GERD (gastroesophageal reflux disease)    H/O hiatal hernia    Hiatal hernia    Hypertension    patient states " no current HTN"   Hypothyroidism    Prediabetes    Shortness of breath    from oxycodone at times   Vitamin B12 deficiency    Vitamin D deficiency     Family History  Problem Relation Age of Onset   Esophageal cancer Father     Past Surgical History:  Procedure Laterality Date   APPENDECTOMY     BACK SURGERY     3 diff surgeries for vertebrea broken   EYE SURGERY     bilateral cataract surgery   HERNIA REPAIR  02/10/1949   bilateral groin as child   HIATAL HERNIA REPAIR     I & D EXTREMITY Left 06/16/2022   Procedure: IRRIGATION AND DEBRIDEMENT ANKLE;  Surgeon: Nadara Mustard, MD;  Location: [facility] OR;  Service: Orthopedics;  Laterality: Left;   I & D EXTREMITY Left 07/21/2022   Procedure: LEFT ANKLE DEBRIDEMENT;  Surgeon: Nadara Mustard, MD;  Location: [facility];  Service: Orthopedics;  Laterality: Left;   JOINT REPLACEMENT  04/12/2010   right knee replacement   TONSILLECTOMY     as child   TOTAL KNEE REVISION  11/20/2011   Procedure: TOTAL KNEE REVISION;  Surgeon: Shelda Pal, MD;  Location[facility];  Service: Orthopedics;  Laterality: Right;  Right Total Knee Revision   Social History   Occupational History   Occupation: Retired    Associate Professor: [employer]  Tobacco Use   Smoking status: Former    Packs/day: 1.00    Years: 20.00    Additional pack years: 0.00    Total pack years: 20.00    Types: Cigarettes    Quit date:  06/27/1980    Years since quitting: 42.2    Passive exposure: Past   Smokeless tobacco: Never  Vaping Use   Vaping Use: Never used  Substance and Sexual Activity   Alcohol use: No   Drug use: No   Sexual activity: Yes

## 2022-09-25 ENCOUNTER — Ambulatory Visit (INDEPENDENT_AMBULATORY_CARE_PROVIDER_SITE_OTHER): Payer: Medicare HMO | Admitting: Orthopedic Surgery

## 2022-09-25 ENCOUNTER — Encounter: Payer: Self-pay | Admitting: Orthopedic Surgery

## 2022-09-25 ENCOUNTER — Telehealth: Payer: Self-pay

## 2022-09-25 DIAGNOSIS — S91002A Unspecified open wound, left ankle, initial encounter: Secondary | ICD-10-CM

## 2022-09-25 DIAGNOSIS — T148XXA Other injury of unspecified body region, initial encounter: Secondary | ICD-10-CM

## 2022-09-25 DIAGNOSIS — L089 Local infection of the skin and subcutaneous tissue, unspecified: Secondary | ICD-10-CM

## 2022-09-25 DIAGNOSIS — Z945 Skin transplant status: Secondary | ICD-10-CM

## 2022-09-25 DIAGNOSIS — L97929 Non-pressure chronic ulcer of unspecified part of left lower leg with unspecified severity: Secondary | ICD-10-CM | POA: Diagnosis not present

## 2022-09-25 MED ORDER — BETAMETHASONE VALERATE 0.1 % EX OINT: 1.0000 | TOPICAL_OINTMENT | Freq: Two times a day (BID) | CUTANEOUS | 2 refills | Status: DC

## 2022-09-25 NOTE — Telephone Encounter (Signed)
During office visit today pt has requested to have his orders for wound care dressing supplies sent to [facility]. This has been done while in office and pt was given the wound intake form with a list of the supplies her requested.

## 2022-09-25 NOTE — Progress Notes (Signed)
Office Visit Note   Patient: [name] Fernandez           Date of Birth: Jan 24, 1941           MRN: [id number] Visit Date: 09/25/2022              Requested by: Lucky Cowboy, MD [address] [place],  [place] [address] PCP: Lucky Cowboy, MD  Chief Complaint  Patient presents with   Left Ankle - Routine Post Op    07/21/2022 left ankle debridement kerecis 7x10 and 38 micro with 1 g vanc         HPI: Patient is a 82 year old gentleman who presents in follow-up for ulceration lateral left ankle.  Patient has been treated with Kerecis micro graft.  Patient states he tried the [facility] but this did not work.  Assessment & Plan: Visit Diagnoses: No diagnosis found.  Plan: Patient will continue with Silvadene dressing changes.  The steroid cream was refilled for the itching he has underneath the dressing.  He will continue with Silvadene dressing changes to the wound.  Follow-Up Instructions: No follow-ups on file.   Ortho Exam  Patient is alert, oriented, no adenopathy, well-dressed, normal affect, normal respiratory effort. Examination the wound has healthy granulation tissue there is no cellulitis odor or drainage.  Wound measures 10 x 6.5 cm in its largest dimension.  Imaging: No results found.   Labs: Lab Results  Component Value Date   HGBA1C 6.4 (H) 04/04/2022   HGBA1C 5.9 (H) 09/14/2021   HGBA1C 5.8 (H) 03/16/2021   ESRSEDRATE 32 (H) 06/15/2022   ESRSEDRATE 40 (H) 06/14/2022   CRP 0.8 06/15/2022   CRP <0.5 06/14/2022   LABURIC 7.8 05/20/2014   LABURIC 6.8 05/20/2013   REPTSTATUS 07/28/2022 FINAL 07/21/2022   GRAMSTAIN  07/21/2022    FEW WBC PRESENT, PREDOMINANTLY PMN NO ORGANISMS SEEN    CULT  07/21/2022    RARE ACHROMOBACTER DENITRIFICANS NO ANAEROBES ISOLATED Performed at [facility], [address] [place], [place] [address]    [facility] ACHROMOBACTER DENITRIFICANS 07/21/2022     Lab Results  Component Value Date    ALBUMIN 2.9 (L) 06/16/2022   ALBUMIN 3.8 06/14/2022   ALBUMIN 4.0 09/25/2016   PREALBUMIN 18 06/14/2022    Lab Results  Component Value Date   MG 1.8 06/19/2022   MG 1.9 04/04/2022   MG 1.9 03/16/2021   Lab Results  Component Value Date   VD25OH 59 04/04/2022   VD25OH 44 09/28/2020   VD25OH 35 07/24/2019    Lab Results  Component Value Date   PREALBUMIN 18 06/14/2022      Latest Ref Rng & Units 06/20/2022    3:24 AM 06/18/2022    2:19 AM 06/17/2022   10:33 AM  CBC EXTENDED  WBC 4.0 - 10.5 K/uL 8.4  9.1  9.8   RBC 4.22 - 5.81 MIL/uL 3.44  3.33  3.43    3.49   Hemoglobin 13.0 - 17.0 g/dL 00.8  67.6  19.5   HCT 39.0 - 52.0 % 31.8  31.4  32.5   Platelets 150 - 400 K/uL 304  344  356      There is no height or weight on file to calculate BMI.  Orders:  No orders of the defined types were placed in this encounter.  Meds ordered this encounter  Medications   betamethasone valerate ointment (VALISONE) 0.1 %    Sig: Apply 1 Application topically 2 (two)  times daily.    Dispense:  30 g    Refill:  2     Procedures: No procedures performed  Clinical Data: No additional findings.  ROS:  All other systems negative, except as noted in the HPI. Review of Systems  Objective: Vital Signs: There were no vitals taken for this visit.  Specialty Comments:  No specialty comments available.  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 06/16/2022   Osteomyelitis 06/16/2022   Tenosynovitis of tibialis posterior tendon 06/15/2022   Wound infection 06/15/2022   Chronic ulcer of left ankle 06/14/2022   Anemia 03/13/2019   Chronic venous insufficiency 03/12/2019   CKD (chronic kidney disease) stage 2, 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/2015   COPD (chronic obstructive pulmonary disease) 12/08/2015   Chronic pain syndrome 05/26/2015   DDD (degenerative disc disease), lumbar 05/26/2015   Medication management  05/26/2015   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   Lumbago with sciatica 08/06/2014   Hyperlipidemia 08/20/2013   Hypertension    Abnormal glucose    Vitamin D deficiency    Arthritis    Vitamin B12 deficiency    Celiac disease 06/19/2012   GERD (gastroesophageal reflux disease) 04/30/2012   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 tube) procedure 04/30/2012   Hypothyroidism 04/30/2012   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   Past Medical History:  Diagnosis Date   Anemia    Arthritis    Celiac disease    diagnoses 06/24/12   Esophageal stricture    GERD (gastroesophageal reflux disease)    H/O hiatal hernia    Hiatal hernia    Hypertension    patient states " no current HTN"   Hypothyroidism    Prediabetes    Shortness of breath    from oxycodone at times   Vitamin B12 deficiency    Vitamin D deficiency     Family History  Problem Relation Age of Onset   Esophageal cancer Father     Past Surgical History:  Procedure Laterality Date   APPENDECTOMY     BACK SURGERY     3 diff surgeries for vertebrea broken   EYE SURGERY     bilateral cataract surgery   HERNIA REPAIR  02/10/1949   bilateral groin as child   HIATAL HERNIA REPAIR     I & D EXTREMITY Left 06/16/2022   Procedure: IRRIGATION AND DEBRIDEMENT ANKLE;  Surgeon: Nadara Mustard, MD;  Location: [facility] OR;  Service: Orthopedics;  Laterality: Left;   I & D EXTREMITY Left 07/21/2022   Procedure: LEFT ANKLE DEBRIDEMENT;  Surgeon: Nadara Mustard, MD;  Location: [facility] OR;  Service: Orthopedics;  Laterality: Left;   JOINT REPLACEMENT  04/12/2010   right knee replacement   TONSILLECTOMY     as child   TOTAL KNEE REVISION  11/20/2011   Procedure: TOTAL KNEE REVISION;  Surgeon: Shelda Pal, MD;  Location: [facility] ORS;  Service: Orthopedics;  Laterality: Right;  Right Total Knee Revision   Social History   Occupational History   Occupation: Retired    Associate Professor: [employer]  Tobacco Use   Smoking status: Former     Packs/day: 1.00    Years: 20.00    Additional pack years: 0.00    Total pack years: 20.00    Types: Cigarettes    Quit date: 06/27/1980    Years since quitting: 42.2    Passive exposure: Past   Smokeless tobacco:  Never  Vaping Use   Vaping Use: Never used  Substance and Sexual Activity   Alcohol use: No   Drug use: No   Sexual activity: Yes

## 2022-09-26 ENCOUNTER — Other Ambulatory Visit: Payer: Self-pay | Admitting: Orthopedic Surgery

## 2022-10-05 ENCOUNTER — Ambulatory Visit (INDEPENDENT_AMBULATORY_CARE_PROVIDER_SITE_OTHER): Payer: Medicare HMO | Admitting: Nurse Practitioner

## 2022-10-05 ENCOUNTER — Encounter: Payer: Self-pay | Admitting: Nurse Practitioner

## 2022-10-05 VITALS — BP 150/76 | HR 96 | Temp 97.9°F | Ht 67.0 in | Wt 177.2 lb

## 2022-10-05 DIAGNOSIS — J449 Chronic obstructive pulmonary disease, unspecified: Secondary | ICD-10-CM

## 2022-10-05 DIAGNOSIS — E538 Deficiency of other specified B group vitamins: Secondary | ICD-10-CM | POA: Diagnosis not present

## 2022-10-05 DIAGNOSIS — K219 Gastro-esophageal reflux disease without esophagitis: Secondary | ICD-10-CM

## 2022-10-05 DIAGNOSIS — M1990 Unspecified osteoarthritis, unspecified site: Secondary | ICD-10-CM

## 2022-10-05 DIAGNOSIS — E039 Hypothyroidism, unspecified: Secondary | ICD-10-CM

## 2022-10-05 DIAGNOSIS — G894 Chronic pain syndrome: Secondary | ICD-10-CM

## 2022-10-05 DIAGNOSIS — E1122 Type 2 diabetes mellitus with diabetic chronic kidney disease: Secondary | ICD-10-CM

## 2022-10-05 DIAGNOSIS — Z79899 Other long term (current) drug therapy: Secondary | ICD-10-CM

## 2022-10-05 DIAGNOSIS — M5136 Other intervertebral disc degeneration, lumbar region: Secondary | ICD-10-CM | POA: Diagnosis not present

## 2022-10-05 DIAGNOSIS — N182 Chronic kidney disease, stage 2 (mild): Secondary | ICD-10-CM | POA: Diagnosis not present

## 2022-10-05 DIAGNOSIS — G8929 Other chronic pain: Secondary | ICD-10-CM

## 2022-10-05 DIAGNOSIS — L97323 Non-pressure chronic ulcer of left ankle with necrosis of muscle: Secondary | ICD-10-CM

## 2022-10-05 DIAGNOSIS — E785 Hyperlipidemia, unspecified: Secondary | ICD-10-CM | POA: Diagnosis not present

## 2022-10-05 DIAGNOSIS — E1169 Type 2 diabetes mellitus with other specified complication: Secondary | ICD-10-CM | POA: Diagnosis not present

## 2022-10-05 DIAGNOSIS — E559 Vitamin D deficiency, unspecified: Secondary | ICD-10-CM

## 2022-10-05 DIAGNOSIS — I10 Essential (primary) hypertension: Secondary | ICD-10-CM | POA: Diagnosis not present

## 2022-10-05 DIAGNOSIS — L308 Other specified dermatitis: Secondary | ICD-10-CM

## 2022-10-05 DIAGNOSIS — M5442 Lumbago with sciatica, left side: Secondary | ICD-10-CM

## 2022-10-05 DIAGNOSIS — I872 Venous insufficiency (chronic) (peripheral): Secondary | ICD-10-CM

## 2022-10-05 MED ORDER — DEXAMETHASONE SODIUM PHOSPHATE 10 MG/ML IJ SOLN
10.0000 mg | Freq: Once | INTRAMUSCULAR | Status: AC
  Administered 2022-10-05: 10 mg via INTRAMUSCULAR

## 2022-10-05 NOTE — Patient Instructions (Signed)

## 2022-10-05 NOTE — Progress Notes (Signed)
FOLLOW UP 6 MONTHS  Assessment:    Essential hypertension Discussed DASH (Dietary Approaches to Stop Hypertension) DASH diet is lower in sodium than a typical American diet. Cut back on foods that are high in saturated fat, cholesterol, and trans fats. Eat more whole-grain foods, fish, poultry, and nuts Remain active and exercise as tolerated daily.  Monitor BP at home-Call if greater than 130/80.  Check CMP/CBC   Hypothyroidism, unspecified type Controlled. Continue Levothyroxine. Reminded to take on an empty stomach 30-80mins before food.  Stop any Biotin Supplement 48-72 hours before next TSH level to reduce the risk of falsely low TSH levels. Continue to monitor.    DM2 Education: Reviewed 'ABCs' of diabetes management  Discussed goals to be met and/or maintained include A1C (<7) Blood pressure (<130/80) Cholesterol (LDL <70) Continue Eye Exam yearly  Continue Dental Exam Q6 mo Discussed dietary recommendations Discussed Physical Activity recommendations Check A1C  Mixed hyperlipidemia Discussed lifestyle modifications. Recommended diet heavy in fruits and veggies, omega 3's. Decrease consumption of animal meats, cheeses, and dairy products. Remain active and exercise as tolerated. Continue to monitor. Check lipids/TSH  Chronic obstructive pulmonary disease, unspecified COPD type (HCC) Avoid triggers, symptoms stable at this time  Gastroesophageal reflux disease, esophagitis presence not specified No suspected reflux complications (Barret/stricture). Lifestyle modification:  wt loss, avoid meals 2-3h before bedtime. Consider eliminating food triggers:  chocolate, caffeine, EtOH, acid/spicy food.  Celiac disease Continue GI followup, diet controlled  DDD (degenerative disc disease), lumbar Continue to monitor, off pain medication   Arthritis Continue to monitor, off pain medication  CKD (chronic kidney disease) stage 2, GFR 60-89 ml/min Discussed how  what you eat and drink can aide in kidney protection. Stay well hydrated. Avoid high salt foods. Avoid NSAIDS. Keep BP and BG well controlled.   Take medications as prescribed. Remain active and exercise as tolerated daily. Maintain weight.  Continue to monitor. 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 Physical activity. Encouraged/praised to build confidence.  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 Renold Don and Dr. Lajoyce Corners Slow healing  Medication management All medications discussed and reviewed in full. All questions and concerns regarding medications addressed.    Pruritic Dermatitis Dexamethasone injection administered - tolerated well. Check kidney and liver function Continue to monitor  Orders Placed This Encounter  Procedures   CBC with Differential/Platelet   COMPLETE METABOLIC PANEL WITH GFR   Lipid panel   TSH   Hemoglobin A1c   VITAMIN D 25 Hydroxy (Vit-D Deficiency, Fractures)   Vitamin B12   Meds ordered this encounter  Medications   dexamethasone (DECADRON) injection 10 mg   Notify office for further evaluation and treatment, questions or concerns if any reported s/s fail to improve.   The patient was advised to call back or seek an in-person evaluation if any symptoms worsen or if the condition fails to improve as anticipated.   Further disposition pending results of labs. Discussed med's effects and SE's.    I discussed the assessment and treatment plan with the patient. The patient was provided an  opportunity to ask questions and all were answered. The patient agreed with the plan and  demonstrated an understanding of the instructions.  Discussed med's effects and SE's. Screening labs and tests as requested with regular follow-up as recommended.  I provided 35 minutes of face-to-face time during this encounter including counseling, chart review, and critical decision making was preformed.  Today's Plan of Care is based on a patient-centered health care approach known as shared decision making - the decisions, tests and treatments allow for patient preferences and values to be balanced with clinical evidence.     Future Appointments  Date Time Provider Department Center  10/25/2022  1:00 PM Adonis Huguenin, NP [facility] None  01/05/2023 10:30 AM Lucky Cowboy, MD [facility] None  04/10/2023  3:00 PM Lucky Cowboy, MD [facility] None    Subjective:  [name] Fernandez is a 82 y.o. male who presents for a general follow up.   He presents today with a sensation of itching from the inside out.  He does not have a raised rash but has noted mild pink splotches along the BUE forearms.  He denies taking any new medications, change soaps or lotions.  Denies fever, chills, N/V.    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  Currently in a flat support shoe.  He has been cleared for ID and now following Dr. Lajoyce Corners and Dr. Renold Don.  Last followed with Dr. Lajoyce Corners 3/25, [facility] for a routine post op visit. On 07/21/2022 left ankle debridement kerecis 7x10 and 38 micro with 1 g vanc.  Patient currently undergoing Silvadene dressing changes and an Ace wrap. He is on antibiotics and probiotics.   He is off all his pain medication and back pain is controlled.   His blood pressure has been controlled at home,Currently on Maxzide 37.5/25 mg daily today their BP is BP: (!) 150/76  BP Readings from Last 3 Encounters:  10/05/22 (!) 150/76  09/05/22 123/74   08/01/22 126/83     He does not workout due to pain/swelling in his legs. He denies chest pain, shortness of breath, dizziness.  He is not on cholesterol medication, was suppose to be on crestor but stopped it and he is on RYRE. His cholesterol is at goal. The cholesterol last visit was:   Lab Results  Component Value Date   CHOL 162 04/04/2022   HDL 32 (L) 04/04/2022   LDLCALC 103 (H) 04/04/2022   TRIG 154 (H) 04/04/2022   CHOLHDL 5.1 (H) 04/04/2022    He has been working on diet and exercise for prediabetes, and denies paresthesia of the feet, polydipsia, polyuria and visual disturbances. Last A1C in the office was:  Lab Results  Component Value Date   HGBA1C 6.4 (H) 04/04/2022   Patient is on Vitamin D supplement.   Lab Results  Component Value Date   VD25OH 59 04/04/2022     BMI is Body mass index is 27.75 kg/m., he is working on diet and exercise. Will have some SOB with exertion, unchanged, no cough or wheezing with it. No CP or other accompaniments with it. He does do some walking.  Last CXR 2015 Wt Readings from Last 3 Encounters:  10/05/22 177 lb 3.2 oz (80.4 kg)  09/05/22 177 lb 6.4 oz (80.5 kg)  08/01/22 170 lb (77.1 kg)     Lab Results  Component Value Date   GFRNONAA >60 06/21/2022   He is on OTC thyroid medication complex. His medication was changed last visit. Lab Results  Component Value Date  TSH 4.15 04/04/2022  .  He has a history of testosterone deficiency . He does take Zinc. He states that the testosterone helps with his energy, libido, muscle mass. Lab Results  Component Value Date   TESTOSTERONE 193 (L) 09/28/2020   He has persistent itching in his legs and uses Hydroxyzine.  It does help with symptoms  Medication Review:   Current Outpatient Medications (Cardiovascular):    torsemide (DEMADEX) 20 MG tablet, Take  1 tablet  Daily for Fluid Retention / Leg Swelling (Patient not taking: Reported on 08/01/2022)   Current Outpatient  Medications (Analgesics):    acetaminophen (TYLENOL) 500 MG tablet, Take 500-1,000 mg by mouth every 6 (six) hours as needed for mild pain or headache.   Ibuprofen (ADVIL) 200 MG CAPS, Take by mouth.   oxyCODONE-acetaminophen (PERCOCET/ROXICET) 5-325 MG tablet, Take 1 tablet by mouth every 4 (four) hours as needed. (Patient not taking: Reported on 09/05/2022)   Current Outpatient Medications (Other):    Ascorbic Acid (VITAMIN C) 1000 MG tablet, Take 1,000 mg by mouth daily.   betamethasone valerate ointment (VALISONE) 0.1 %, Apply 1 Application topically 2 (two) times daily.   Cholecalciferol (VITAMIN D3) 125 MCG (5000 UT) TABS, Take 5,000 Units by mouth 2 (two) times daily.   gabapentin (NEURONTIN) 600 MG tablet, Take 1/2 to 1 tablet 2 to 3 x /Daily as needed for Chronic Pain   hydrOXYzine (ATARAX) 25 MG tablet, Take  1 tablet  3 x /day  as needed  for Anxiety or Sleep (Patient taking differently: Take 25 mg by mouth at bedtime.)   NON FORMULARY, Take 1 capsule by mouth See admin instructions. Thyroid Care Plus capsules- Take 1 capsule by mouth once a day (Patient not taking: Reported on 08/01/2022)   omeprazole (PRILOSEC OTC) 20 MG tablet, Take 20 mg by mouth daily before breakfast.   polyethylene glycol (MIRALAX / GLYCOLAX) 17 g packet, Take 17 g by mouth daily. (Patient not taking: Reported on 07/13/2022)   senna-docusate (SENOKOT-S) 8.6-50 MG tablet, Take 2 tablets by mouth 2 (two) times daily.   silver sulfADIAZINE (SILVADENE) 1 % cream, Apply 1 Application topically daily. Apply to affected area daily plus dry dressing   sulfamethoxazole-trimethoprim (BACTRIM DS) 800-160 MG tablet, Take 1 tablet by mouth 2 (two) times daily. (Patient not taking: Reported on 09/05/2022)   triamcinolone cream (KENALOG) 0.1 %, APPLY TO THE LEFT LEG UNDER THE WRAP DAILY WHERE THERE IS ITCHING OR RASH X 2 WEEKS OR UNTIL HEALED  Allergies: Allergies  Allergen Reactions   Feraheme [Ferumoxytol] Swelling and Other  (See Comments)    Swelling of lips and tongue, could not talk 30 minutes after the infusion.   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 06/16/2022   Osteomyelitis 06/16/2022   Tenosynovitis of tibialis posterior tendon 06/15/2022   Wound infection 06/15/2022   Chronic ulcer of left ankle 06/14/2022   Anemia 03/13/2019   Chronic venous insufficiency 03/12/2019   CKD (chronic kidney disease) stage 2, 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/2015   COPD (chronic obstructive pulmonary disease)  12/08/2015   Chronic pain syndrome 05/26/2015   DDD (degenerative disc disease), lumbar 05/26/2015   Medication management 05/26/2015   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   Lumbago with sciatica 08/06/2014   Hyperlipidemia 08/20/2013   Hypertension    Abnormal glucose    Vitamin D deficiency    Arthritis    Vitamin B12 deficiency    Celiac disease 06/19/2012   GERD (gastroesophageal reflux disease) 04/30/2012   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 tube) procedure 04/30/2012   Hypothyroidism 04/30/2012   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    Screening Tests Immunization History  Administered Date(s) Administered   DT (Pediatric) 01/12/2011   Fluad Quad(high Dose 65+) 02/14/2019   Influenza, High Dose Seasonal PF 05/20/2014, 05/26/2015, 03/20/2018, 03/16/2021, 04/04/2022   Influenza,inj,Quad PF,6+ Mos 03/20/2018   Influenza-Unspecified 04/30/2013, 03/29/2016, 03/02/2017   Pneumococcal Conjugate-13 05/20/2014   Pneumococcal Polysaccharide-23 03/29/2016   Pneumococcal-Unspecified 05/20/2008    Zoster, Live 07/15/2014   Health Maintenance  Topic Date Due   COVID-19 Vaccine (1) Never done   Zoster Vaccines- Shingrix (1 of 2) Never done   OPHTHALMOLOGY EXAM  07/11/2017   DTaP/Tdap/Td (2 - Tdap) 01/11/2021   Medicare Annual Wellness (AWV)  03/11/2021   FOOT EXAM  03/16/2022   HEMOGLOBIN A1C  10/04/2022   INFLUENZA VACCINE  01/11/2023   Diabetic kidney evaluation - Urine ACR  04/05/2023   Diabetic kidney evaluation - eGFR measurement  06/22/2023   Pneumonia Vaccine 58+ Years old  Completed   HPV VACCINES  Aged Out    Tetanus: 2012   Pneumovax: 2017  Prevnar 13: 2015 Flu vaccine: 2020 Zostavax: 2016 COVID declines  Colonoscopy: 05/2012 Dr. Rhea Belton  Last one  EGD: 05/2012  Names of Other Physician/Practitioners you currently use: 1. [facility] here for primary care 2. Theodis Aguas, eye doctor, last visit 07/2016 Patient Care Team: Lucky Cowboy, MD as PCP - General (Internal Medicine) Thyra Breed, MD as Consulting Physician (Anesthesiology) Pyrtle, Carie Caddy, MD as Consulting Physician (Gastroenterology) Juanell Fairly, RN as [facility] Care Management    Review of Systems  Constitutional:  Negative for chills, diaphoresis, fever, malaise/fatigue and weight loss.  HENT:  Negative for congestion, ear discharge, ear pain, hearing loss, nosebleeds, sore throat and tinnitus.   Eyes: Negative.   Respiratory:  Negative for cough, hemoptysis, sputum production, shortness of breath, wheezing and stridor.   Cardiovascular:  Negative for chest pain, palpitations, orthopnea, claudication, leg swelling and PND.  Gastrointestinal:  Negative for abdominal pain, blood in stool, constipation, diarrhea, heartburn, melena, nausea and vomiting.  Genitourinary:  Negative for dysuria, flank pain, frequency, hematuria and urgency.  Musculoskeletal:  Positive for back pain, joint pain and myalgias. Negative for falls and neck pain.  Skin:   Negative for itching and rash.  Neurological:  Negative for dizziness, tingling, tremors, sensory change, speech change, focal weakness, seizures, loss of consciousness and headaches.  Endo/Heme/Allergies: Negative.   Psychiatric/Behavioral: Negative.       Objective:     Today's Vitals   10/05/22 1113 10/05/22 1215  BP: (!) 160/80 (!) 150/76  Pulse: 96   Temp: 97.9 F (36.6 C)   SpO2: 99%   Weight: 177 lb 3.2 oz (80.4 kg)   Height:  (1.702 m)    Body mass index is 27.75 kg/m.  General appearance: alert, no distress, WD/WN, male HEENT: normocephalic, sclerae anicteric, TMs pearly, nares patent, no discharge or erythema, pharynx normal Oral cavity: MMM, no lesions Neck: supple, no lymphadenopathy,  no thyromegaly, no masses Heart: RRR, normal S1, S2, no murmurs Lungs: CTA bilaterally, no wheezes, rhonchi, or rales Abdomen: +bs, soft, non tender, non distended, no masses, no hepatomegaly, no splenomegaly Musculoskeletal: nontender, no swelling, no obvious deformity.  Extremities: 1+ pitting edema lower leg bilaterally, no visible ulcer or break down, no cyanosis, no clubbing Pulses: 2+ symmetric, upper and lower extremities, normal cap refill Neurological: alert, oriented x 3, CN2-12 intact, strength normal upper extremities and lower extremities,  DTRs 2+ throughout, no cerebellar signs, gait Normal and Antalgic Psychiatric: normal affect, behavior normal, pleasant      Tryton Bodi, NP   10/05/2022

## 2022-10-06 LAB — HEMOGLOBIN A1C
Hgb A1c MFr Bld: 6.3 % of total Hgb — ABNORMAL HIGH (ref <5.7)
Mean Plasma Glucose: 134 mg/dL
eAG (mmol/L): 7.4 mmol/L

## 2022-10-06 LAB — VITAMIN B12: Vitamin B-12: 485 pg/mL (ref 200–1100)

## 2022-10-06 LAB — COMPLETE METABOLIC PANEL WITH GFR
AG Ratio: 1.4 (calc) (ref 1.0–2.5)
ALT: 22 U/L (ref 9–46)
AST: 24 U/L (ref 10–35)
Albumin: 4.4 g/dL (ref 3.6–5.1)
Alkaline phosphatase (APISO): 116 U/L (ref 35–144)
BUN: 18 mg/dL (ref 7–25)
CO2: 28 mmol/L (ref 20–32)
Calcium: 9.8 mg/dL (ref 8.6–10.3)
Chloride: 102 mmol/L (ref 98–110)
Creat: 1.14 mg/dL (ref 0.70–1.22)
Globulin: 3.1 g/dL (calc) (ref 1.9–3.7)
Glucose, Bld: 122 mg/dL — ABNORMAL HIGH (ref 65–99)
Potassium: 4.7 mmol/L (ref 3.5–5.3)
Sodium: 141 mmol/L (ref 135–146)
Total Bilirubin: 0.9 mg/dL (ref 0.2–1.2)
Total Protein: 7.5 g/dL (ref 6.1–8.1)
eGFR: 65 mL/min/{1.73_m2} (ref >=60)

## 2022-10-06 LAB — CBC WITH DIFFERENTIAL/PLATELET
Absolute Monocytes: 721 cells/uL (ref 200–950)
Basophils Absolute: 80 cells/uL (ref 0–200)
Basophils Relative: 0.9 %
Eosinophils Absolute: 338 cells/uL (ref 15–500)
Eosinophils Relative: 3.8 %
HCT: 37.4 % — ABNORMAL LOW (ref 38.5–50.0)
Hemoglobin: 12.9 g/dL — ABNORMAL LOW (ref 13.2–17.1)
Lymphs Abs: 2252 cells/uL (ref 850–3900)
MCH: 32.3 pg (ref 27.0–33.0)
MCHC: 34.5 g/dL (ref 32.0–36.0)
MCV: 93.5 fL (ref 80.0–100.0)
MPV: 10.6 fL (ref 7.5–12.5)
Monocytes Relative: 8.1 %
Neutro Abs: 5509 cells/uL (ref 1500–7800)
Neutrophils Relative %: 61.9 %
Platelets: 369 10*3/uL (ref 140–400)
RBC: 4 10*6/uL — ABNORMAL LOW (ref 4.20–5.80)
RDW: 14.4 % (ref 11.0–15.0)
Total Lymphocyte: 25.3 %
WBC: 8.9 10*3/uL (ref 3.8–10.8)

## 2022-10-06 LAB — LIPID PANEL
Cholesterol: 207 mg/dL — ABNORMAL HIGH (ref <200)
HDL: 36 mg/dL — ABNORMAL LOW (ref >=40)
LDL Cholesterol (Calc): 140 mg/dL (calc) — ABNORMAL HIGH
Non-HDL Cholesterol (Calc): 171 mg/dL (calc) — ABNORMAL HIGH (ref <130)
Total CHOL/HDL Ratio: 5.8 (calc) — ABNORMAL HIGH (ref <5.0)
Triglycerides: 177 mg/dL — ABNORMAL HIGH (ref <150)

## 2022-10-06 LAB — TSH: TSH: 5.57 mIU/L — ABNORMAL HIGH (ref 0.40–4.50)

## 2022-10-06 LAB — VITAMIN D 25 HYDROXY (VIT D DEFICIENCY, FRACTURES): Vit D, 25-Hydroxy: 57 ng/mL (ref 30–100)

## 2022-10-10 ENCOUNTER — Ambulatory Visit: Payer: Medicare HMO | Admitting: Internal Medicine

## 2022-10-11 ENCOUNTER — Ambulatory Visit: Payer: Medicare HMO | Admitting: Family

## 2022-10-25 ENCOUNTER — Ambulatory Visit: Payer: Medicare HMO | Admitting: Family

## 2022-10-25 ENCOUNTER — Encounter: Payer: Self-pay | Admitting: Family

## 2022-10-25 DIAGNOSIS — Z945 Skin transplant status: Secondary | ICD-10-CM | POA: Diagnosis not present

## 2022-10-25 DIAGNOSIS — L97322 Non-pressure chronic ulcer of left ankle with fat layer exposed: Secondary | ICD-10-CM | POA: Diagnosis not present

## 2022-10-25 NOTE — Progress Notes (Signed)
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  He has previously been treated with a Kerecis micro graft.  He most recently has been using Silvadene dressing changes daily.  He is pleased with improvement in the ulcer.  He is concerned about some itching on bilateral forearms as well as his left groin.  This has been troublesome since hospitalization.  He initially thought he was having some sort of allergic reaction to an antibiotic.  He has been off of these for several weeks.  He has been trying triamcinolone as well as clobetasol topically with multiple times a day without any improvement.  He did have a steroid injection IM a few weeks ago which also provided no relief.  Has not had any new soaps detergents changes in medication  Assessment & Plan: Visit Diagnoses: No diagnosis found.  Plan: Atopic dermatitis, will have him hold off on Silvadene for the next 3 weeks to see if he may be having a reaction to the sulfa.  He will use silver cell for daily dressing changes. Have ordered wound care supplies through [facility]. Will follow up in office with dr duda.  Follow-Up Instructions: No follow-ups on file.   Ortho Exam  Patient is alert, oriented, no adenopathy, well-dressed, normal affect, normal respiratory effort. Please see attached image. On examination of LLE 1 + pitting edema. No erythema or warmth. L lateral ankle wound is now 9 x 5 cm with 2 mm of depth. Scant fibrinous tissue.   Imaging: No results found.   Labs: Lab Results   Component Value Date   HGBA1C 6.3 (H) 10/05/2022   HGBA1C 6.4 (H) 04/04/2022   HGBA1C 5.9 (H) 09/14/2021   ESRSEDRATE 32 (H) 06/15/2022   ESRSEDRATE 40 (H) 06/14/2022   CRP 0.8 06/15/2022   CRP <0.5 06/14/2022   LABURIC 7.8 05/20/2014   LABURIC 6.8 05/20/2013   REPTSTATUS 07/28/2022 FINAL 07/21/2022   GRAMSTAIN  07/21/2022    FEW WBC PRESENT, PREDOMINANTLY PMN NO ORGANISMS SEEN    CULT  07/21/2022    RARE ACHROMOBACTER DENITRIFICANS NO ANAEROBES ISOLATED Performed at [facility], [address] [place], [place] [address]    Cyndia Bent DENITRIFICANS 07/21/2022     Lab Results  Component Value Date   ALBUMIN 2.9 (L) 06/16/2022   ALBUMIN 3.8 06/14/2022   ALBUMIN 4.0 09/25/2016   PREALBUMIN 18 06/14/2022    Lab Results  Component Value Date   MG 1.8 06/19/2022   MG 1.9 04/04/2022   MG 1.9 03/16/2021   Lab Results  Component Value Date   VD25OH 57 10/05/2022   VD25OH 59 04/04/2022   VD25OH 44 09/28/2020    Lab Results  Component Value Date   PREALBUMIN 18 06/14/2022      Latest Ref Rng & Units 10/05/2022   12:16 PM 06/20/2022    3:24 AM 06/18/2022  2:19 AM  CBC EXTENDED  WBC 3.8 - 10.8 Thousand/uL 8.9  8.4  9.1   RBC 4.20 - 5.80 Million/uL 4.00  3.44  3.33   Hemoglobin 13.2 - 17.1 g/dL 19.1  47.8  29.5   HCT 38.5 - 50.0 % 37.4  31.8  31.4   Platelets 140 - 400 Thousand/uL 369  304  344   NEUT# 1,500 - 7,800 cells/uL 5,509     Lymph# 850 - 3,900 cells/uL 2,252        There is no height or weight on file to calculate BMI.  Orders:  No orders of the defined types were placed in this encounter.  No orders of the defined types were placed in this encounter.    Procedures: No procedures performed  Clinical Data: No additional findings.  ROS:  All other systems negative, except as noted in the HPI. Review of Systems  Objective: Vital Signs: There were no vitals taken for this visit.  Specialty Comments:  No specialty  comments available.  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) 06/16/2022   Osteomyelitis (HCC) 06/16/2022   Tenosynovitis of tibialis posterior tendon 06/15/2022   Wound infection 06/15/2022   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   Anemia 03/13/2019   Chronic venous insufficiency 03/12/2019   CKD (chronic kidney disease) stage 2, 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/2015   COPD (chronic obstructive pulmonary disease) (HCC) 12/08/2015   Chronic pain syndrome 05/26/2015   DDD (degenerative disc disease), lumbar 05/26/2015   Medication management 05/26/2015   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   Lumbago with sciatica 08/06/2014   Hyperlipidemia 08/20/2013   Hypertension    Abnormal glucose    Vitamin D deficiency    Arthritis    Vitamin B12 deficiency    Celiac disease 06/19/2012   GERD (gastroesophageal reflux disease) 04/30/2012   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 tube) procedure 04/30/2012   Hypothyroidism 04/30/2012   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   Past Medical History:  Diagnosis Date   Anemia    Arthritis    Celiac disease    diagnoses 06/24/12   Esophageal stricture    GERD (gastroesophageal reflux disease)    H/O hiatal hernia    Hiatal hernia    Hypertension    patient states " no current HTN"   Hypothyroidism    Prediabetes    Shortness of breath    from oxycodone at times   Vitamin B12 deficiency    Vitamin D deficiency     Family History  Problem Relation Age of Onset   Esophageal cancer Father     Past Surgical History:  Procedure Laterality Date   APPENDECTOMY     BACK SURGERY     3 diff surgeries for vertebrea broken   EYE SURGERY     bilateral cataract surgery   HERNIA REPAIR  02/10/1949   bilateral groin as child   HIATAL HERNIA REPAIR     I & D EXTREMITY Left 06/16/2022   Procedure: IRRIGATION AND DEBRIDEMENT ANKLE;  Surgeon: Nadara Mustard, MD;   Location: [facility] OR;  Service: Orthopedics;  Laterality: Left;   I & D EXTREMITY Left 07/21/2022   Procedure: LEFT ANKLE DEBRIDEMENT;  Surgeon: Nadara Mustard, MD;  Location: [facility] OR;  Service: Orthopedics;  Laterality: Left;   JOINT REPLACEMENT  04/12/2010   right knee replacement   TONSILLECTOMY     as child  TOTAL KNEE REVISION  11/20/2011   Procedure: TOTAL KNEE REVISION;  Surgeon: Shelda Pal, MD;  Location: [facility] ORS;  Service: Orthopedics;  Laterality: Right;  Right Total Knee Revision   Social History   Occupational History   Occupation: Retired    Associate Professor: [employer]  Tobacco Use   Smoking status: Former    Packs/day: 1.00    Years: 20.00    Additional pack years: 0.00    Total pack years: 20.00    Types: Cigarettes    Quit date: 06/27/1980    Years since quitting: 42.3    Passive exposure: Past   Smokeless tobacco: Never  Vaping Use   Vaping Use: Never used  Substance and Sexual Activity   Alcohol use: No   Drug use: No   Sexual activity: Yes

## 2022-10-26 DIAGNOSIS — L97929 Non-pressure chronic ulcer of unspecified part of left lower leg with unspecified severity: Secondary | ICD-10-CM | POA: Diagnosis not present

## 2022-10-27 ENCOUNTER — Telehealth: Payer: Self-pay | Admitting: Family

## 2022-10-27 NOTE — Telephone Encounter (Signed)
Patient asking about some supplies, please call [phone])

## 2022-10-30 NOTE — Telephone Encounter (Signed)
SW pt, he got his supplies from [facility] but he thought he was supposed to get silver alginate, but received the calcium cloth. He wants to make sure this is okay or if we need to change to Ag?

## 2022-10-31 NOTE — Telephone Encounter (Signed)
This is ok, that's the closest [facility] had

## 2022-10-31 NOTE — Telephone Encounter (Signed)
Pt informed

## 2022-11-02 ENCOUNTER — Telehealth: Payer: Self-pay | Admitting: Nurse Practitioner

## 2022-11-02 ENCOUNTER — Other Ambulatory Visit: Payer: Self-pay | Admitting: Internal Medicine

## 2022-11-02 MED ORDER — DEXAMETHASONE 4 MG PO TABS: ORAL_TABLET | ORAL | 0 refills | Status: DC

## 2022-11-02 NOTE — Telephone Encounter (Signed)
Made in error

## 2022-11-07 ENCOUNTER — Ambulatory Visit (INDEPENDENT_AMBULATORY_CARE_PROVIDER_SITE_OTHER): Payer: Medicare HMO | Admitting: Orthopedic Surgery

## 2022-11-07 ENCOUNTER — Telehealth: Payer: Self-pay | Admitting: Orthopedic Surgery

## 2022-11-07 DIAGNOSIS — T148XXA Other injury of unspecified body region, initial encounter: Secondary | ICD-10-CM

## 2022-11-07 DIAGNOSIS — Z945 Skin transplant status: Secondary | ICD-10-CM

## 2022-11-07 DIAGNOSIS — L089 Local infection of the skin and subcutaneous tissue, unspecified: Secondary | ICD-10-CM

## 2022-11-07 DIAGNOSIS — L97322 Non-pressure chronic ulcer of left ankle with fat layer exposed: Secondary | ICD-10-CM

## 2022-11-07 MED ORDER — LEVOFLOXACIN 750 MG PO TABS: 750.0000 mg | ORAL_TABLET | Freq: Every day | ORAL | 0 refills | Status: DC

## 2022-11-07 NOTE — Telephone Encounter (Signed)
SW pt, he is coming in today

## 2022-11-07 NOTE — Telephone Encounter (Signed)
Patient wants to speak to [place] about his wound.Marland Kitchen asking questions about his medication.

## 2022-11-09 ENCOUNTER — Encounter (HOSPITAL_COMMUNITY): Payer: Self-pay | Admitting: Orthopedic Surgery

## 2022-11-09 ENCOUNTER — Other Ambulatory Visit: Payer: Self-pay

## 2022-11-09 ENCOUNTER — Encounter: Payer: Self-pay | Admitting: Orthopedic Surgery

## 2022-11-09 NOTE — Progress Notes (Signed)
Mr. [name] Fernandez denies chest pain or shortness of breath. Patient denies having any s/s of Covid in his to Covid or  any s/s of upper or lower respiratory in the past 8 weeks.   [name] Fernandez PCP is Dr. Winn Jock.

## 2022-11-09 NOTE — Progress Notes (Signed)
Office Visit Note   Patient: [name] Fernandez           Date of Birth: 1940/12/22           MRN: [id number] Visit Date: 11/07/2022              Requested by: Lucky Cowboy, MD [address] [place],  [place] [address] PCP: Lucky Cowboy, MD  Chief Complaint  Patient presents with   Left Ankle - Follow-up    07/21/2022 left ankle debridement       HPI: Patient is an 82 year old gentleman status post debridement left ankle wound.  Patient has been showing slow steady improvement however presents at this time with ischemic changes.  Patient is 3-1/2 months status post initial debridement and tissue graft.  Patient is currently on steroids for a systemic rash. Assessment & Plan: Visit Diagnoses:  1. H/O skin graft   2. 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 Wound infection     Plan: Will plan for repeat debridement.  Will send tissue for cultures patient is provided a prescription for Levaquin.  Patient reports a possible sensitivity to sulfa medications.  Follow-Up Instructions: Return in about 1 week (around 11/14/2022).   Ortho Exam  Patient is alert, oriented, no adenopathy, well-dressed, normal affect, normal respiratory effort. Examination the wound is 7 x 10 cm lateral border of the left ankle.  The wound is ischemic with some green and black changes on the border of the wound possible Pseudomonas infection.  Patient's Doppler shows a strong triphasic dorsalis pedis pulse.  Patient has epiboly around the wound edges indicated above the wound healing has stalled.  Imaging: No results found.   Labs: Lab Results  Component Value Date   HGBA1C 6.3 (H) 10/05/2022   HGBA1C 6.4 (H) 04/04/2022   HGBA1C 5.9 (H) 09/14/2021   ESRSEDRATE 32 (H) 06/15/2022   ESRSEDRATE 40 (H) 06/14/2022   CRP 0.8 06/15/2022   CRP <0.5 06/14/2022   LABURIC 7.8 05/20/2014   LABURIC 6.8 05/20/2013   REPTSTATUS 07/28/2022 FINAL 07/21/2022   GRAMSTAIN   07/21/2022    FEW WBC PRESENT, PREDOMINANTLY PMN NO ORGANISMS SEEN    CULT  07/21/2022    RARE ACHROMOBACTER DENITRIFICANS NO ANAEROBES ISOLATED Performed at [facility], [address] [place], [place] [address]    [facility] ACHROMOBACTER DENITRIFICANS 07/21/2022     Lab Results  Component Value Date   ALBUMIN 2.9 (L) 06/16/2022   ALBUMIN 3.8 06/14/2022   ALBUMIN 4.0 09/25/2016   PREALBUMIN 18 06/14/2022    Lab Results  Component Value Date   MG 1.8 06/19/2022   MG 1.9 04/04/2022   MG 1.9 03/16/2021   Lab Results  Component Value Date   VD25OH 57 10/05/2022   VD25OH 59 04/04/2022   VD25OH 44 09/28/2020    Lab Results  Component Value Date   PREALBUMIN 18 06/14/2022      Latest Ref Rng & Units 10/05/2022   12:16 PM 06/20/2022    3:24 AM 06/18/2022    2:19 AM  CBC EXTENDED  WBC 3.8 - 10.8 Thousand/uL 8.9  8.4  9.1   RBC 4.20 - 5.80 Million/uL 4.00  3.44  3.33   Hemoglobin 13.2 - 17.1 g/dL 82.9  56.2  13.0   HCT 38.5 - 50.0 % 37.4  31.8  31.4   Platelets 140 - 400 Thousand/uL 369  304  344   NEUT# 1,500 -  7,800 cells/uL 5,509     Lymph# 850 - 3,900 cells/uL 2,252        There is no height or weight on file to calculate BMI.  Orders:  No orders of the defined types were placed in this encounter.  Meds ordered this encounter  Medications   levofloxacin (LEVAQUIN) 750 MG tablet    Sig: Take 1 tablet (750 mg total) by mouth daily.    Dispense:  14 tablet    Refill:  0     Procedures: No procedures performed  Clinical Data: No additional findings.  ROS:  All other systems negative, except as noted in the HPI. Review of Systems  Objective: Vital Signs: There were no vitals taken for this visit.  Specialty Comments:  No specialty comments available.  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) 06/16/2022   Osteomyelitis (HCC) 06/16/2022   Tenosynovitis of  tibialis posterior tendon 06/15/2022   Wound infection 06/15/2022   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   Anemia 03/13/2019   Chronic venous insufficiency 03/12/2019   CKD (chronic kidney disease) stage 2, 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/2015   COPD (chronic obstructive pulmonary disease) (HCC) 12/08/2015   Chronic pain syndrome 05/26/2015   DDD (degenerative disc disease), lumbar 05/26/2015   Medication management 05/26/2015   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   Lumbago with sciatica 08/06/2014   Hyperlipidemia 08/20/2013   Hypertension    Abnormal glucose    Vitamin D deficiency    Arthritis    Vitamin B12 deficiency    Celiac disease 06/19/2012   GERD (gastroesophageal reflux disease) 04/30/2012   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 tube) procedure 04/30/2012   Hypothyroidism 04/30/2012   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   Past Medical History:  Diagnosis Date   Anemia    Arthritis    Celiac disease    diagnoses 06/24/12   Esophageal stricture    GERD (gastroesophageal reflux disease)    H/O hiatal hernia    Hiatal hernia    Hypertension    patient states " no current HTN"   Hypothyroidism    Prediabetes    Shortness of breath    from oxycodone at times   Vitamin B12 deficiency    Vitamin D deficiency     Family History  Problem Relation Age of Onset   Esophageal cancer Father     Past Surgical History:  Procedure Laterality Date   APPENDECTOMY     BACK SURGERY     3 diff surgeries for vertebrea broken   EYE SURGERY     bilateral cataract surgery   HERNIA REPAIR  02/10/1949   bilateral groin as child   HIATAL HERNIA REPAIR     I & D EXTREMITY Left 06/16/2022   Procedure: IRRIGATION AND DEBRIDEMENT ANKLE;  Surgeon: Nadara Mustard, MD;  Location: [facility] OR;  Service: Orthopedics;  Laterality: Left;   I & D EXTREMITY Left 07/21/2022   Procedure: LEFT ANKLE DEBRIDEMENT;  Surgeon: Nadara Mustard, MD;  Location: [facility] OR;  Service: Orthopedics;  Laterality:  Left;   JOINT REPLACEMENT  04/12/2010   right knee replacement   TONSILLECTOMY     as child   TOTAL KNEE REVISION  11/20/2011   Procedure: TOTAL KNEE REVISION;  Surgeon: Shelda Pal, MD;  Location: [facility] ORS;  Service: Orthopedics;  Laterality: Right;  Right Total Knee Revision   Social History  Occupational History   Occupation: Retired    Associate Professor: [employer]  Tobacco Use   Smoking status: Former    Packs/day: 1.00    Years: 20.00    Additional pack years: 0.00    Total pack years: 20.00    Types: Cigarettes    Quit date: 06/27/1980    Years since quitting: 42.3    Passive exposure: Past   Smokeless tobacco: Never  Vaping Use   Vaping Use: Never used  Substance and Sexual Activity   Alcohol use: No   Drug use: No   Sexual activity: Yes

## 2022-11-10 ENCOUNTER — Ambulatory Visit (HOSPITAL_COMMUNITY)
Admission: RE | Admit: 2022-11-10 | Discharge: 2022-11-10 | Disposition: A | Discharge status: Home / Self Care | Payer: Medicare HMO | Source: Ambulatory Visit | Attending: Orthopedic Surgery | Admitting: Orthopedic Surgery

## 2022-11-10 ENCOUNTER — Encounter (HOSPITAL_COMMUNITY)
Admission: RE | Disposition: A | Discharge status: Home / Self Care | Payer: Self-pay | Source: Ambulatory Visit | Attending: Orthopedic Surgery

## 2022-11-10 ENCOUNTER — Encounter (HOSPITAL_COMMUNITY): Payer: Self-pay | Admitting: Orthopedic Surgery

## 2022-11-10 ENCOUNTER — Ambulatory Visit (HOSPITAL_BASED_OUTPATIENT_CLINIC_OR_DEPARTMENT_OTHER): Payer: Medicare HMO | Admitting: Registered Nurse

## 2022-11-10 ENCOUNTER — Other Ambulatory Visit: Payer: Self-pay

## 2022-11-10 ENCOUNTER — Ambulatory Visit (HOSPITAL_COMMUNITY): Payer: Medicare HMO | Admitting: Registered Nurse

## 2022-11-10 DIAGNOSIS — T8131XA Disruption of external operation (surgical) wound, not elsewhere classified, initial encounter: Secondary | ICD-10-CM | POA: Insufficient documentation

## 2022-11-10 DIAGNOSIS — K219 Gastro-esophageal reflux disease without esophagitis: Secondary | ICD-10-CM | POA: Insufficient documentation

## 2022-11-10 DIAGNOSIS — D638 Anemia in other chronic diseases classified elsewhere: Secondary | ICD-10-CM | POA: Diagnosis not present

## 2022-11-10 DIAGNOSIS — I10 Essential (primary) hypertension: Secondary | ICD-10-CM | POA: Insufficient documentation

## 2022-11-10 DIAGNOSIS — L97323 Non-pressure chronic ulcer of left ankle with necrosis of muscle: Secondary | ICD-10-CM

## 2022-11-10 DIAGNOSIS — S91002A Unspecified open wound, left ankle, initial encounter: Secondary | ICD-10-CM

## 2022-11-10 DIAGNOSIS — T8130XA Disruption of wound, unspecified, initial encounter: Secondary | ICD-10-CM

## 2022-11-10 DIAGNOSIS — B965 Pseudomonas (aeruginosa) (mallei) (pseudomallei) as the cause of diseases classified elsewhere: Secondary | ICD-10-CM | POA: Diagnosis not present

## 2022-11-10 DIAGNOSIS — X58XXXA Exposure to other specified factors, initial encounter: Secondary | ICD-10-CM | POA: Diagnosis not present

## 2022-11-10 DIAGNOSIS — N189 Chronic kidney disease, unspecified: Secondary | ICD-10-CM | POA: Diagnosis not present

## 2022-11-10 DIAGNOSIS — K900 Celiac disease: Secondary | ICD-10-CM | POA: Insufficient documentation

## 2022-11-10 DIAGNOSIS — R0602 Shortness of breath: Secondary | ICD-10-CM | POA: Insufficient documentation

## 2022-11-10 DIAGNOSIS — K449 Diaphragmatic hernia without obstruction or gangrene: Secondary | ICD-10-CM | POA: Diagnosis not present

## 2022-11-10 DIAGNOSIS — D649 Anemia, unspecified: Secondary | ICD-10-CM | POA: Insufficient documentation

## 2022-11-10 DIAGNOSIS — E1122 Type 2 diabetes mellitus with diabetic chronic kidney disease: Secondary | ICD-10-CM | POA: Diagnosis not present

## 2022-11-10 DIAGNOSIS — J449 Chronic obstructive pulmonary disease, unspecified: Secondary | ICD-10-CM | POA: Insufficient documentation

## 2022-11-10 DIAGNOSIS — L97322 Non-pressure chronic ulcer of left ankle with fat layer exposed: Secondary | ICD-10-CM | POA: Insufficient documentation

## 2022-11-10 DIAGNOSIS — Z1623 Resistance to quinolones and fluoroquinolones: Secondary | ICD-10-CM | POA: Diagnosis not present

## 2022-11-10 DIAGNOSIS — D631 Anemia in chronic kidney disease: Secondary | ICD-10-CM | POA: Diagnosis not present

## 2022-11-10 DIAGNOSIS — M1990 Unspecified osteoarthritis, unspecified site: Secondary | ICD-10-CM | POA: Insufficient documentation

## 2022-11-10 DIAGNOSIS — Z87891 Personal history of nicotine dependence: Secondary | ICD-10-CM | POA: Diagnosis not present

## 2022-11-10 DIAGNOSIS — E039 Hypothyroidism, unspecified: Secondary | ICD-10-CM | POA: Diagnosis not present

## 2022-11-10 DIAGNOSIS — I129 Hypertensive chronic kidney disease with stage 1 through stage 4 chronic kidney disease, or unspecified chronic kidney disease: Secondary | ICD-10-CM

## 2022-11-10 HISTORY — PX: I & D EXTREMITY: SHX5045

## 2022-11-10 HISTORY — DX: Prediabetes: R73.03

## 2022-11-10 HISTORY — DX: Non-pressure chronic ulcer of unspecified ankle with unspecified severity: L97.309

## 2022-11-10 LAB — CBC
HCT: 36.3 % — ABNORMAL LOW (ref 39.0–52.0)
Hemoglobin: 12.5 g/dL — ABNORMAL LOW (ref 13.0–17.0)
MCH: 31.9 pg (ref 26.0–34.0)
MCHC: 34.4 g/dL (ref 30.0–36.0)
MCV: 92.6 fL (ref 80.0–100.0)
Platelets: 371 10*3/uL (ref 150–400)
RBC: 3.92 MIL/uL — ABNORMAL LOW (ref 4.22–5.81)
RDW: 14.5 % (ref 11.5–15.5)
WBC: 14.6 10*3/uL — ABNORMAL HIGH (ref 4.0–10.5)
nRBC: 0.2 % (ref 0.0–0.2)

## 2022-11-10 LAB — BASIC METABOLIC PANEL
Anion gap: 10 (ref 5–15)
BUN: 34 mg/dL — ABNORMAL HIGH (ref 8–23)
CO2: 25 mmol/L (ref 22–32)
Calcium: 8.9 mg/dL (ref 8.9–10.3)
Chloride: 96 mmol/L — ABNORMAL LOW (ref 98–111)
Creatinine, Ser: 1.16 mg/dL (ref 0.61–1.24)
GFR, Estimated: >60 mL/min (ref >60)
Glucose, Bld: 108 mg/dL — ABNORMAL HIGH (ref 70–99)
Potassium: 3.5 mmol/L (ref 3.5–5.1)
Sodium: 131 mmol/L — ABNORMAL LOW (ref 135–145)

## 2022-11-10 LAB — GLUCOSE, CAPILLARY
Glucose-Capillary: 110 mg/dL — ABNORMAL HIGH (ref 70–99)
Glucose-Capillary: 128 mg/dL — ABNORMAL HIGH (ref 70–99)

## 2022-11-10 SURGERY — IRRIGATION AND DEBRIDEMENT EXTREMITY
Anesthesia: General | Laterality: Left

## 2022-11-10 MED ORDER — AMISULPRIDE (ANTIEMETIC) 5 MG/2ML IV SOLN: 10.0000 mg | Freq: Once | INTRAVENOUS | Status: DC | PRN

## 2022-11-10 MED ORDER — FENTANYL CITRATE (PF) 100 MCG/2ML IJ SOLN
INTRAMUSCULAR | Status: AC
  Filled 2022-11-10: qty 2

## 2022-11-10 MED ORDER — PROPOFOL 10 MG/ML IV BOLUS
INTRAVENOUS | Status: AC
  Filled 2022-11-10: qty 20

## 2022-11-10 MED ORDER — HYDROMORPHONE HCL 1 MG/ML IJ SOLN
0.2500 mg | INTRAMUSCULAR | Status: DC | PRN
  Administered 2022-11-10: 0.5 mg via INTRAVENOUS

## 2022-11-10 MED ORDER — FENTANYL CITRATE (PF) 100 MCG/2ML IJ SOLN
25.0000 ug | INTRAMUSCULAR | Status: DC | PRN
  Administered 2022-11-10 (×3): 50 ug via INTRAVENOUS

## 2022-11-10 MED ORDER — LACTATED RINGERS IV SOLN: INTRAVENOUS | Status: DC

## 2022-11-10 MED ORDER — CEFAZOLIN SODIUM-DEXTROSE 2-4 GM/100ML-% IV SOLN
INTRAVENOUS | Status: AC
  Filled 2022-11-10: qty 100

## 2022-11-10 MED ORDER — FENTANYL CITRATE (PF) 250 MCG/5ML IJ SOLN
INTRAMUSCULAR | Status: DC | PRN
  Administered 2022-11-10 (×4): 25 ug via INTRAVENOUS

## 2022-11-10 MED ORDER — DEXAMETHASONE SODIUM PHOSPHATE 10 MG/ML IJ SOLN
INTRAMUSCULAR | Status: DC | PRN
  Administered 2022-11-10: 5 mg via INTRAVENOUS

## 2022-11-10 MED ORDER — ORAL CARE MOUTH RINSE: 15.0000 mL | Freq: Once | OROMUCOSAL | Status: AC

## 2022-11-10 MED ORDER — ACETAMINOPHEN 500 MG PO TABS
1000.0000 mg | ORAL_TABLET | Freq: Once | ORAL | Status: AC
  Administered 2022-11-10: 1000 mg via ORAL
  Filled 2022-11-10: qty 2

## 2022-11-10 MED ORDER — LIDOCAINE 2% (20 MG/ML) 5 ML SYRINGE
INTRAMUSCULAR | Status: DC | PRN
  Administered 2022-11-10: 80 mg via INTRAVENOUS

## 2022-11-10 MED ORDER — ONDANSETRON HCL 4 MG/2ML IJ SOLN
INTRAMUSCULAR | Status: DC | PRN
  Administered 2022-11-10: 4 mg via INTRAVENOUS

## 2022-11-10 MED ORDER — FENTANYL CITRATE (PF) 250 MCG/5ML IJ SOLN
INTRAMUSCULAR | Status: AC
  Filled 2022-11-10: qty 5

## 2022-11-10 MED ORDER — HYDROCODONE-ACETAMINOPHEN 5-325 MG PO TABS: 1.0000 | ORAL_TABLET | ORAL | 0 refills | Status: DC | PRN

## 2022-11-10 MED ORDER — EPHEDRINE SULFATE-NACL 50-0.9 MG/10ML-% IV SOSY
PREFILLED_SYRINGE | INTRAVENOUS | Status: DC | PRN
  Administered 2022-11-10: 10 mg via INTRAVENOUS

## 2022-11-10 MED ORDER — OXYCODONE HCL 5 MG PO TABS
5.0000 mg | ORAL_TABLET | Freq: Once | ORAL | Status: AC
  Administered 2022-11-10: 5 mg via ORAL

## 2022-11-10 MED ORDER — CEFAZOLIN SODIUM-DEXTROSE 2-4 GM/100ML-% IV SOLN
2.0000 g | INTRAVENOUS | Status: AC
  Administered 2022-11-10: 2 g via INTRAVENOUS

## 2022-11-10 MED ORDER — HYDROMORPHONE HCL 1 MG/ML IJ SOLN
INTRAMUSCULAR | Status: AC
  Filled 2022-11-10: qty 1

## 2022-11-10 MED ORDER — OXYCODONE HCL 5 MG PO TABS
ORAL_TABLET | ORAL | Status: AC
  Filled 2022-11-10: qty 1

## 2022-11-10 MED ORDER — PHENYLEPHRINE HCL-NACL 20-0.9 MG/250ML-% IV SOLN
INTRAVENOUS | Status: DC | PRN
  Administered 2022-11-10: 50 ug/min via INTRAVENOUS

## 2022-11-10 MED ORDER — CHLORHEXIDINE GLUCONATE 0.12 % MT SOLN
15.0000 mL | Freq: Once | OROMUCOSAL | Status: AC
  Administered 2022-11-10: 15 mL via OROMUCOSAL
  Filled 2022-11-10: qty 15

## 2022-11-10 MED ORDER — PROPOFOL 10 MG/ML IV BOLUS
INTRAVENOUS | Status: DC | PRN
  Administered 2022-11-10: 90 mg via INTRAVENOUS
  Administered 2022-11-10: 110 mg via INTRAVENOUS

## 2022-11-10 SURGICAL SUPPLY — 40 items
BAG COUNTER SPONGE SURGICOUNT (BAG) IMPLANT
BAG SPNG CNTER NS LX DISP (BAG)
BLADE SURG 21 STRL SS (BLADE) ×1 IMPLANT
BNDG CMPR 5X6 CHSV STRCH STRL (GAUZE/BANDAGES/DRESSINGS)
BNDG COHESIVE 1X5 TAN STRL LF (GAUZE/BANDAGES/DRESSINGS) IMPLANT
BNDG COHESIVE 6X5 TAN ST LF (GAUZE/BANDAGES/DRESSINGS) IMPLANT
BNDG GAUZE DERMACEA FLUFF 4 (GAUZE/BANDAGES/DRESSINGS) ×2 IMPLANT
BNDG GZE DERMACEA 4 6PLY (GAUZE/BANDAGES/DRESSINGS) ×2
CANISTER PREVENA 45 (CANNISTER) IMPLANT
COVER SURGICAL LIGHT HANDLE (MISCELLANEOUS) ×2 IMPLANT
DRAPE U-SHAPE 47X51 STRL (DRAPES) ×1 IMPLANT
DRESSING VERAFLO CLEANS CC MED (GAUZE/BANDAGES/DRESSINGS) IMPLANT
DRSG ADAPTIC 3X8 NADH LF (GAUZE/BANDAGES/DRESSINGS) ×1 IMPLANT
DRSG VERAFLO CLEANSE CC MED (GAUZE/BANDAGES/DRESSINGS) ×1
DURAPREP 26ML APPLICATOR (WOUND CARE) ×1 IMPLANT
ELECT REM PT RETURN 9FT ADLT (ELECTROSURGICAL)
ELECTRODE REM PT RTRN 9FT ADLT (ELECTROSURGICAL) IMPLANT
GAUZE SPONGE 4X4 12PLY STRL (GAUZE/BANDAGES/DRESSINGS) ×1 IMPLANT
GLOVE BIOGEL PI IND STRL 9 (GLOVE) ×1 IMPLANT
GLOVE SURG ORTHO 9.0 STRL STRW (GLOVE) ×1 IMPLANT
GOWN STRL REUS W/ TWL XL LVL3 (GOWN DISPOSABLE) ×2 IMPLANT
GOWN STRL REUS W/TWL XL LVL3 (GOWN DISPOSABLE) ×2
GRAFT SKIN WND MICRO 38 (Tissue) IMPLANT
HANDPIECE INTERPULSE COAX TIP (DISPOSABLE)
KIT BASIN OR (CUSTOM PROCEDURE TRAY) ×1 IMPLANT
KIT DRSG PREVENA PLUS 7DAY 125 (MISCELLANEOUS) IMPLANT
KIT TURNOVER KIT B (KITS) ×1 IMPLANT
MANIFOLD NEPTUNE II (INSTRUMENTS) ×1 IMPLANT
NS IRRIG 1000ML POUR BTL (IV SOLUTION) ×1 IMPLANT
PACK ORTHO EXTREMITY (CUSTOM PROCEDURE TRAY) ×1 IMPLANT
PAD ARMBOARD 7.5X6 YLW CONV (MISCELLANEOUS) ×2 IMPLANT
PAD NEG PRESSURE SENSATRAC (MISCELLANEOUS) IMPLANT
SET HNDPC FAN SPRY TIP SCT (DISPOSABLE) IMPLANT
STOCKINETTE IMPERVIOUS 9X36 MD (GAUZE/BANDAGES/DRESSINGS) IMPLANT
SUT ETHILON 2 0 PSLX (SUTURE) ×1 IMPLANT
SWAB COLLECTION DEVICE MRSA (MISCELLANEOUS) ×1 IMPLANT
SWAB CULTURE ESWAB REG 1ML (MISCELLANEOUS) IMPLANT
TOWEL GREEN STERILE (TOWEL DISPOSABLE) ×1 IMPLANT
TUBE CONNECTING 12X1/4 (SUCTIONS) ×1 IMPLANT
YANKAUER SUCT BULB TIP NO VENT (SUCTIONS) ×1 IMPLANT

## 2022-11-10 NOTE — Anesthesia Preprocedure Evaluation (Addendum)
Anesthesia Evaluation  Patient identified by MRN, date of birth, ID band Patient awake    Reviewed: Allergy & Precautions, NPO status , Patient's Chart, lab work & pertinent test results  History of Anesthesia Complications Negative for: history of anesthetic complications  Airway Mallampati: II  TM Distance: >3 FB Neck ROM: Full    Dental  (+) Dental Advisory Given, Partial Upper, Missing, Edentulous Lower   Pulmonary shortness of breath, COPD, former smoker   Pulmonary exam normal        Cardiovascular hypertension, Pt. on medications Normal cardiovascular exam Rhythm:Regular     Neuro/Psych negative neurological ROS  negative psych ROS   GI/Hepatic Neg liver ROS, hiatal hernia,GERD  Medicated and Controlled,, Celiac disease    Endo/Other  diabetesHypothyroidism    Renal/GU CRFRenal disease     Musculoskeletal  (+) Arthritis ,    Abdominal   Peds  Hematology  (+) Blood dyscrasia, anemia   Anesthesia Other Findings   Reproductive/Obstetrics                              Anesthesia Physical Anesthesia Plan  ASA: 3  Anesthesia Plan: General   Post-op Pain Management: Tylenol PO (pre-op)*   Induction: Intravenous  PONV Risk Score and Plan: 2 and Treatment may vary due to age or medical condition, Ondansetron and Dexamethasone  Airway Management Planned: LMA  Additional Equipment: None  Intra-op Plan:   Post-operative Plan: Extubation in OR  Informed Consent: I have reviewed the patients History and Physical, chart, labs and discussed the procedure including the risks, benefits and alternatives for the proposed anesthesia with the patient or authorized representative who has indicated his/her understanding and acceptance.     Dental advisory given  Plan Discussed with: CRNA  Anesthesia Plan Comments:          Anesthesia Quick Evaluation

## 2022-11-10 NOTE — Anesthesia Postprocedure Evaluation (Signed)
Anesthesia Post Note  Patient: [name] Fernandez  Procedure(s) Performed: LEFT ANKLE DEBRIDEMENT (Left)     Patient location during evaluation: PACU Anesthesia Type: General Level of consciousness: sedated and patient cooperative Pain management: pain level controlled Vital Signs Assessment: post-procedure vital signs reviewed and stable Respiratory status: spontaneous breathing Cardiovascular status: stable Anesthetic complications: no   No notable events documented.  Last Vitals:  Vitals:   11/10/22 1215 11/10/22 1230  BP: 124/62 121/70  Pulse: 71 64  Resp: 13 14  Temp:  36.7 C  SpO2: 92% 92%    Last Pain:  Vitals:   11/10/22 1230  TempSrc:   PainSc: 3                  Lewie Loron

## 2022-11-10 NOTE — Anesthesia Procedure Notes (Signed)
Procedure Name: LMA Insertion Date/Time: 11/10/2022 10:52 AM  Performed by: Loleta Calyssa Zobrist, CRNAPre-anesthesia Checklist: Patient identified, Patient being monitored, Timeout performed, Emergency Drugs available and Suction available Patient Re-evaluated:Patient Re-evaluated prior to induction Oxygen Delivery Method: Circle system utilized Preoxygenation: Pre-oxygenation with 100% oxygen Induction Type: IV induction Ventilation: Mask ventilation without difficulty LMA: LMA inserted LMA Size: 4.0 Tube type: Oral Number of attempts: 1 Placement Confirmation: positive ETCO2 and breath sounds checked- equal and bilateral Tube secured with: Tape Dental Injury: Teeth and Oropharynx as per pre-operative assessment

## 2022-11-10 NOTE — Transfer of Care (Signed)
Immediate Anesthesia Transfer of Care Note  Patient: [name] Fernandez  Procedure(s) Performed: LEFT ANKLE DEBRIDEMENT (Left)  Patient Location: PACU  Anesthesia Type:General  Level of Consciousness: drowsy and patient cooperative  Airway & Oxygen Therapy: Patient Spontanous Breathing and Patient connected to face mask oxygen  Post-op Assessment: Report given to RN and Post -op Vital signs reviewed and stable  Post vital signs: Reviewed and stable  Last Vitals:  Vitals Value Taken Time  BP 159/86 11/10/22 1137  Temp    Pulse 71 11/10/22 1140  Resp 20 11/10/22 1140  SpO2 96 % 11/10/22 1140  Vitals shown include unvalidated device data.  Last Pain:  Vitals:   11/10/22 0943  TempSrc:   PainSc: 5          Complications: No notable events documented.

## 2022-11-10 NOTE — H&P (Signed)
[name] Fernandez is an 82 y.o. male.   Chief Complaint: Wound dehiscence left ankle. HPI: Patient is an 82 year old gentleman status post debridement left ankle wound. Patient has been showing slow steady improvement however presents at this time with ischemic changes. Patient is 3-1/2 months status post initial debridement and tissue graft. Patient is currently on steroids for a systemic rash.   Past Medical History:  Diagnosis Date   Anemia    Celiac disease    diagnoses 06/24/12   Chronic ulcer of ankle (HCC)    left   Esophageal stricture    GERD (gastroesophageal reflux disease)    Hiatal hernia    Hypertension    patient states " no current HTN"   Hypothyroidism    Prediabetes    Shortness of breath    from oxycodone at times   Vitamin B12 deficiency    Vitamin D deficiency     Past Surgical History:  Procedure Laterality Date   APPENDECTOMY     BACK SURGERY     3 diff surgeries for vertebrea broken   EYE SURGERY     bilateral cataract surgery   HERNIA REPAIR  02/10/1949   bilateral groin as child   HIATAL HERNIA REPAIR     I & D EXTREMITY Left 06/16/2022   Procedure: IRRIGATION AND DEBRIDEMENT ANKLE;  Surgeon: Nadara Mustard, MD;  Location: [facility] OR;  Service: Orthopedics;  Laterality: Left;   I & D EXTREMITY Left 07/21/2022   Procedure: LEFT ANKLE DEBRIDEMENT;  Surgeon: Nadara Mustard, MD;  Location: [facility] OR;  Service: Orthopedics;  Laterality: Left;   JOINT REPLACEMENT  04/12/2010   right knee replacement   TONSILLECTOMY     as child   TOTAL KNEE REVISION  11/20/2011   Procedure: TOTAL KNEE REVISION;  Surgeon: Shelda Pal, MD;  Location: [facility] ORS;  Service: Orthopedics;  Laterality: Right;  Right Total Knee Revision    Family History  Problem Relation Age of Onset   Esophageal cancer Father    Social History:  reports that he quit smoking about 42 years ago. His smoking use included cigarettes. He has a 20.00 pack-year smoking history. He has been exposed to tobacco  smoke. He has never used smokeless tobacco. He reports that he does not drink alcohol and does not use drugs.  Allergies:  Allergies  Allergen Reactions   Feraheme [Ferumoxytol] Swelling and Other (See Comments)    Swelling of lips and tongue, could not talk 30 minutes after the infusion.   Latex Other (See Comments)    Latex butterfly bandages tore off the skin and caused terrible blisters   Sulfa Antibiotics Hives    Severe itching, blisters   Wound Dressing Adhesive Other (See Comments)    Latex butterfly bandages tore off the skin and caused terrible blisters   Levothyroxine Swelling and Other (See Comments)    Lip swelling   Doxycycline Nausea Only    No medications prior to admission.    No results found for this or any previous visit (from the past 48 hour(s)). No results found.  Review of Systems  All other systems reviewed and are negative.   There were no vitals taken for this visit. Physical Exam  Patient is alert, oriented, no adenopathy, well-dressed, normal affect, normal respiratory effort. Examination the wound is 7 x 10 cm lateral border of the left ankle.  The wound is ischemic with some green and black changes on the border of the wound possible  Pseudomonas infection.  Patient's Doppler shows a strong triphasic dorsalis pedis pulse.  Patient has epiboly around the wound edges indicated above the wound healing has stalled. Assessment/Plan . H/O skin graft   2. 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 Wound infection       Plan: Will plan for repeat debridement.  Will send tissue for cultures patient is provided a prescription for Levaquin.  Patient reports a possible sensitivity to sulfa medications.  Nadara Mustard, MD 11/10/2022, 6:24 AM

## 2022-11-10 NOTE — Op Note (Signed)
11/10/2022  11:39 AM  PATIENT:  [name] Fernandez    PRE-OPERATIVE DIAGNOSIS: Dehiscence wound Left Ankle  POST-OPERATIVE DIAGNOSIS:  Same  PROCEDURE:  LEFT ANKLE EXCISIONAL DEBRIDEMENT with excision of skin and soft tissue muscle and fascia. Application Kerecis micro graft 38 cm. Application of cleanse choice wound VAC sponge.  SURGEON:  Nadara Mustard, MD  PHYSICIAN ASSISTANT:None ANESTHESIA:   General  PREOPERATIVE INDICATIONS:  [name] Fernandez is a  82 y.o. male with a diagnosis of Wound Left Ankle who failed conservative measures and elected for surgical management.    The risks benefits and alternatives were discussed with the patient preoperatively including but not limited to the risks of infection, bleeding, nerve injury, cardiopulmonary complications, the need for revision surgery, among others, and the patient was willing to proceed.  OPERATIVE IMPLANTS:   Implant Name Type Inv. Item Serial No. Manufacturer Lot No. LRB No. Used Action  GRAFT SKIN WND MICRO 38 - [id number] Tissue GRAFT SKIN WND MICRO 38  [employer] [id number] Left 1 Implanted    @ENCIMAGES @  OPERATIVE FINDINGS: Tissue margins had ischemic necrotic tissue prior to debridement.  After debridement wound was healthy and viable with no nonviable tissue.  Wound measurements 11 x 9 cm after debridement.  Tissue was sent for cultures.  OPERATIVE PROCEDURE: Patient was brought the operating room and underwent a general anesthetic.  After adequate levels anesthesia were obtained patient's left lower extremity was prepped using DuraPrep draped into a sterile field a timeout was called.  A 21 blade knife was used to excise skin and soft tissue and fascia that was ischemic and nonviable.  After debridement the wound had healthy viable tissue that measured 11 x 9 cm.  Electrocautery was used for hemostasis the wound was irrigated normal saline.  The wound was filled with 38 cm of Kerecis micro graft.  This was covered  with a Prevena cleanse choice wound VAC sponge derma tack this had a good suction fit overwrapped with Coban patient was extubated taken the PACU in stable condition.  Tissue was sent for cultures.   DISCHARGE PLANNING:  Antibiotic duration: Preoperative antibiotics  Weightbearing: Weightbearing as tolerated in cam boot  Pain medication: Prescription for Vicodin  Dressing care/ Wound VAC: Wound VAC  Ambulatory devices: Walker or crutches  Discharge to: Home.  Follow-up: In the office 1 week post operative.

## 2022-11-11 ENCOUNTER — Encounter (HOSPITAL_COMMUNITY): Payer: Self-pay | Admitting: Orthopedic Surgery

## 2022-11-11 LAB — AEROBIC/ANAEROBIC CULTURE W GRAM STAIN (SURGICAL/DEEP WOUND)

## 2022-11-12 LAB — AEROBIC/ANAEROBIC CULTURE W GRAM STAIN (SURGICAL/DEEP WOUND)

## 2022-11-13 ENCOUNTER — Encounter: Payer: Self-pay | Admitting: Orthopedic Surgery

## 2022-11-13 ENCOUNTER — Ambulatory Visit (INDEPENDENT_AMBULATORY_CARE_PROVIDER_SITE_OTHER): Payer: Medicare HMO | Admitting: Orthopedic Surgery

## 2022-11-13 ENCOUNTER — Other Ambulatory Visit: Payer: Self-pay

## 2022-11-13 ENCOUNTER — Telehealth: Payer: Self-pay

## 2022-11-13 DIAGNOSIS — Z945 Skin transplant status: Secondary | ICD-10-CM

## 2022-11-13 DIAGNOSIS — L97322 Non-pressure chronic ulcer of left ankle with fat layer exposed: Secondary | ICD-10-CM

## 2022-11-13 DIAGNOSIS — L089 Local infection of the skin and subcutaneous tissue, unspecified: Secondary | ICD-10-CM

## 2022-11-13 NOTE — Telephone Encounter (Signed)
-----   Message from Nadara Mustard, MD sent at 11/13/2022  7:04 AM EDT ----- We need to refer him to infectious disease.  His pseudomonal infection is resistant to oral antibiotics. ----- Message ----- From: Interface, Lab In [place] Sent: 11/10/2022   1:51 PM EDT To: Nadara Mustard, MD

## 2022-11-13 NOTE — Telephone Encounter (Signed)
Order in chart for infectious disease. Pt has a work in appt this morning and will advise of infections disease referral at appt this morning

## 2022-11-13 NOTE — Progress Notes (Signed)
Office Visit Note   Patient: [name] Fernandez           Date of Birth: 03-Sep-1940           MRN: [id number] Visit Date: 11/13/2022              Requested by: Lucky Cowboy, MD [address] [place],  [place] [address] PCP: Lucky Cowboy, MD  Chief Complaint  Patient presents with   Left Ankle - Routine Post Op    11/10/2022 left ankle debridement       HPI: Patient is an 82 year old gentleman status post repeat debridement left lateral ankle.  Postoperative cultures are showing Pseudomonas resistant to oral antibiotics.  A consult was placed with infectious disease and patient states infectious disease did contact him this morning.  Assessment & Plan: Visit Diagnoses:  1. 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 H/O skin graft     Plan: Patient will follow-up with infectious disease anticipate IV antibiotics.  Patient will stop his oral antibiotics at this time.  Patient does have hydrofluoric blue and silver cell at home he will use these dressings for wound coverage after Dial soap cleansing.  Follow-Up Instructions: Return in about 1 week (around 11/20/2022).   Ortho Exam  Patient is alert, oriented, no adenopathy, well-dressed, normal affect, normal respiratory effort. Examination the wound bed has healthy granulation tissue there is resolution of the necrotic changes there is clear serous drainage.  Wound measures 6 x 10 cm.  Patient also has some triamcinolone cream and recommended that he not use this on the wound.  Imaging: No results found. No images are attached to the encounter.  Labs: Lab Results  Component Value Date   HGBA1C 6.3 (H) 10/05/2022   HGBA1C 6.4 (H) 04/04/2022   HGBA1C 5.9 (H) 09/14/2021   ESRSEDRATE 32 (H) 06/15/2022   ESRSEDRATE 40 (H) 06/14/2022   CRP 0.8 06/15/2022   CRP <0.5 06/14/2022   LABURIC 7.8 05/20/2014   LABURIC 6.8 05/20/2013   REPTSTATUS PENDING 11/10/2022   GRAMSTAIN  11/10/2022     ABUNDANT SQUAMOUS EPITHELIAL CELLS PRESENT MODERATE WBC PRESENT, PREDOMINANTLY PMN ABUNDANT GRAM NEGATIVE RODS FEW GRAM POSITIVE COCCI Performed at [facility], [address] [place], [place] [address]    CULT  11/10/2022    ABUNDANT PSEUDOMONAS AERUGINOSA NO ANAEROBES ISOLATED; CULTURE IN PROGRESS FOR 5 DAYS    LABORGA PSEUDOMONAS AERUGINOSA 11/10/2022     Lab Results  Component Value Date   ALBUMIN 2.9 (L) 06/16/2022   ALBUMIN 3.8 06/14/2022   ALBUMIN 4.0 09/25/2016   PREALBUMIN 18 06/14/2022    Lab Results  Component Value Date   MG 1.8 06/19/2022   MG 1.9 04/04/2022   MG 1.9 03/16/2021   Lab Results  Component Value Date   VD25OH 57 10/05/2022   VD25OH 59 04/04/2022   VD25OH 44 09/28/2020    Lab Results  Component Value Date   PREALBUMIN 18 06/14/2022      Latest Ref Rng & Units 11/10/2022    9:56 AM 10/05/2022   12:16 PM 06/20/2022    3:24 AM  CBC EXTENDED  WBC 4.0 - 10.5 K/uL 14.6  8.9  8.4   RBC 4.22 - 5.81 MIL/uL 3.92  4.00  3.44   Hemoglobin 13.0 - 17.0 g/dL 82.9  56.2  13.0   HCT 39.0 - 52.0 % 36.3  37.4  31.8   Platelets 150 - 400 K/uL  371  369  304   NEUT# 1,500 - 7,800 cells/uL  5,509    Lymph# 850 - 3,900 cells/uL  2,252       There is no height or weight on file to calculate BMI.  Orders:  No orders of the defined types were placed in this encounter.  No orders of the defined types were placed in this encounter.    Procedures: No procedures performed  Clinical Data: No additional findings.  ROS:  All other systems negative, except as noted in the HPI. Review of Systems  Objective: Vital Signs: There were no vitals taken for this visit.  Specialty Comments:  No specialty comments available.  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) 06/16/2022   Osteomyelitis (HCC) 06/16/2022    Tenosynovitis of tibialis posterior tendon 06/15/2022   Wound infection 06/15/2022   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   Anemia 03/13/2019   Chronic venous insufficiency 03/12/2019   CKD (chronic kidney disease) stage 2, 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/2015   COPD (chronic obstructive pulmonary disease) (HCC) 12/08/2015   Chronic pain syndrome 05/26/2015   DDD (degenerative disc disease), lumbar 05/26/2015   Medication management 05/26/2015   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   Lumbago with sciatica 08/06/2014   Hyperlipidemia 08/20/2013   Hypertension    Abnormal glucose    Vitamin D deficiency    Arthritis    Vitamin B12 deficiency    Celiac disease 06/19/2012   GERD (gastroesophageal reflux disease) 04/30/2012   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 tube) procedure 04/30/2012   Hypothyroidism 04/30/2012   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   Past Medical History:  Diagnosis Date   Anemia    Celiac disease    diagnoses 06/24/12   Chronic ulcer of ankle (HCC)    left   Esophageal stricture    GERD (gastroesophageal reflux disease)    Hiatal hernia    Hypertension    patient states " no current HTN"   Hypothyroidism    Pre-diabetes    Prediabetes    Shortness of breath    from oxycodone at times   Vitamin B12 deficiency    Vitamin D deficiency     Family History  Problem Relation Age of Onset   Esophageal cancer Father     Past Surgical History:  Procedure Laterality Date   APPENDECTOMY     BACK SURGERY     3 diff surgeries for vertebrea broken   EYE SURGERY     bilateral cataract surgery   HERNIA REPAIR  02/10/1949   bilateral groin as child   HIATAL HERNIA REPAIR     I & D EXTREMITY Left 06/16/2022   Procedure: IRRIGATION AND DEBRIDEMENT ANKLE;  Surgeon: Nadara Mustard, MD;  Location: [facility] OR;  Service: Orthopedics;  Laterality: Left;   I & D EXTREMITY Left 07/21/2022   Procedure: LEFT ANKLE DEBRIDEMENT;  Surgeon: Nadara Mustard, MD;  Location: [facility] OR;   Service: Orthopedics;  Laterality: Left;   I & D EXTREMITY Left 11/10/2022   Procedure: LEFT ANKLE DEBRIDEMENT;  Surgeon: Nadara Mustard, MD;  Location: [facility] OR;  Service: Orthopedics;  Laterality: Left;   JOINT REPLACEMENT  04/12/2010   right knee replacement   TONSILLECTOMY     as child   TOTAL KNEE REVISION  11/20/2011   Procedure: TOTAL KNEE REVISION;  Surgeon: Molli Hazard  Rosalia Hammers, MD;  Location: [facility] ORS;  Service: Orthopedics;  Laterality: Right;  Right Total Knee Revision   Social History   Occupational History   Occupation: Retired    Associate Professor: [employer]  Tobacco Use   Smoking status: Former    Packs/day: 1.00    Years: 20.00    Additional pack years: 0.00    Total pack years: 20.00    Types: Cigarettes    Quit date: 06/27/1980    Years since quitting: 42.4    Passive exposure: Past   Smokeless tobacco: Never  Vaping Use   Vaping Use: Never used  Substance and Sexual Activity   Alcohol use: No   Drug use: No   Sexual activity: Yes

## 2022-11-14 ENCOUNTER — Telehealth: Payer: Self-pay

## 2022-11-14 NOTE — Telephone Encounter (Signed)
Please see the message below. Pt is s/p a left ankle debridement last Friday and cultures positive for pseudomonas not sensitive to oral abx referral to infections disease for PICC line abx and they are scheduling a week out. The pt's wife is concerned please advise

## 2022-11-14 NOTE — Telephone Encounter (Signed)
I called and sw pt's wife to advise of below. Pt advised she is already on the list. Pt wanted to make sure that it was ok to wait. I advised that I had marked the referral urgent and that infectious dx has access to all the information and culture results. The appt is sch for 11/21/2022 this is ok per MD.

## 2022-11-15 ENCOUNTER — Encounter: Payer: Self-pay | Admitting: Internal Medicine

## 2022-11-15 ENCOUNTER — Other Ambulatory Visit: Payer: Self-pay

## 2022-11-15 ENCOUNTER — Ambulatory Visit: Payer: Medicare HMO | Admitting: Internal Medicine

## 2022-11-15 VITALS — BP 127/78 | HR 66 | Resp 16 | Ht 67.0 in | Wt 178.0 lb

## 2022-11-15 DIAGNOSIS — S91002D Unspecified open wound, left ankle, subsequent encounter: Secondary | ICD-10-CM

## 2022-11-15 DIAGNOSIS — L089 Local infection of the skin and subcutaneous tissue, unspecified: Secondary | ICD-10-CM | POA: Diagnosis not present

## 2022-11-15 NOTE — Progress Notes (Signed)
   Subjective:    Patient ID: DJ MCCRANIE, male    DOB: Oct 01, 1940, 82 y.o.   MRN: [id number]  HPI [name] Fernandez is here for a work in visit for concern for osteomyelitis of the same site.   He has a chronic ulcer and was followed by wound care center for the left ankle wound that started last year which was noted to be worsening in January of this year, despite multiple antibiotic courses.  MRI with tenosynovitis and underwent I and D and cultures with FQ resistant Pseudomonas and treated with cefepime for 6 weeks.  Wound was healing and he completed antibiotics.  However on 5/28 visit with Dr. Lajoyce Corners, noted to have ischemic changes and dehiscence of the wound and taken back to the OR 5/31 and underwent debridement with excision of tissue and culture sent and positive for Pseudomonas again, cipro resistant.     Review of Systems  Constitutional:  Negative for chills and fever.  Gastrointestinal:  Negative for diarrhea.  Skin:  Negative for rash.       Objective:   Physical Exam Eyes:     General: No scleral icterus. Pulmonary:     Effort: Pulmonary effort is normal.  Musculoskeletal:     Comments: Wound wrapped  Neurological:     Mental Status: He is alert.   SH: no tobacco        Assessment & Plan:

## 2022-11-15 NOTE — Assessment & Plan Note (Signed)
Worsening again now s/p debridement  Growth with the same Pseudomonas.   No oral options.  Pretty well treated with surgery but will start IV cefepime for 2-3 weeks and monitor.    Diagnosis: Wound infection  Culture Result: Pseudomonas, cipro resistant  Allergies  Allergen Reactions   Feraheme [Ferumoxytol] Swelling and Other (See Comments)    Swelling of lips and tongue, could not talk 30 minutes after the infusion.   Latex Other (See Comments)    Latex butterfly bandages tore off the skin and caused terrible blisters   Sulfa Antibiotics Hives    Severe itching, blisters   Wound Dressing Adhesive Other (See Comments)    Latex butterfly bandages tore off the skin and caused terrible blisters   Levothyroxine Swelling and Other (See Comments)    Lip swelling   Doxycycline Nausea Only    OPAT Orders Discharge antibiotics to be given via PICC line Discharge antibiotics: cefepime 2 grams IV three times a day Per pharmacy protocol yes Duration: 21 days  [facility] Care Per Protocol: yes  Home health RN for IV administration and teaching; PICC line care and labs.    Labs weekly while on IV antibiotics: _x_ CBC with differential __ BMP _x_ CMP _x_ CRP _x_ ESR __ Vancomycin trough __ CK  __ Please pull PIC at completion of IV antibiotics _x_ Please leave PIC in place until doctor has seen patient or been notified  Fax weekly labs to [phone]

## 2022-11-15 NOTE — Telephone Encounter (Signed)
Per Dr Luciana Axe; Patient to have PICC placed on 11/24/22 2pm at [facility] Needs IV Cefepime 2 G x 3 weeks(See order for dosing) Jeri Modena will handle first dose NO SHORT STAY  Patient given date and time of PICC placement and will await call from [name] C. Notified [facility] and [facility]

## 2022-11-16 LAB — AEROBIC/ANAEROBIC CULTURE W GRAM STAIN (SURGICAL/DEEP WOUND)

## 2022-11-17 ENCOUNTER — Encounter: Payer: Medicare HMO | Admitting: Family

## 2022-11-17 LAB — AEROBIC/ANAEROBIC CULTURE W GRAM STAIN (SURGICAL/DEEP WOUND)

## 2022-11-20 ENCOUNTER — Telehealth: Payer: Self-pay

## 2022-11-20 NOTE — Telephone Encounter (Signed)
Spoke with pt's wife to inform her IR currently does not have any open appointments this week. Will see if they can inform office of any cancellations to move picc line appointment Requested she also contact Dr. Lajoyce Corners to inform him of increased pain. Pain mainly occurs when he stands up and walks. Is taking oxycodone which helps a little. Advised that if pain gets worse; or if they notice increase swelling, drainage, fever, or redness to go to ED.  Juanita Laster, RMA

## 2022-11-20 NOTE — Telephone Encounter (Signed)
Patient's wife called office requesting earlier picc line appointment. States that over this weekend pain has increased. Would like to start antibiotics as soon as possible.  Will reach out to IR and HH to see if we can move appointment up. Will update provider as well. Juanita Laster, RMA

## 2022-11-21 ENCOUNTER — Ambulatory Visit: Payer: Medicare HMO | Admitting: Infectious Diseases

## 2022-11-22 ENCOUNTER — Encounter: Payer: Self-pay | Admitting: Family

## 2022-11-22 ENCOUNTER — Ambulatory Visit (INDEPENDENT_AMBULATORY_CARE_PROVIDER_SITE_OTHER): Payer: Medicare HMO | Admitting: Family

## 2022-11-22 DIAGNOSIS — Z945 Skin transplant status: Secondary | ICD-10-CM

## 2022-11-22 DIAGNOSIS — L97322 Non-pressure chronic ulcer of left ankle with fat layer exposed: Secondary | ICD-10-CM

## 2022-11-22 MED ORDER — HYDROCODONE-ACETAMINOPHEN 10-325 MG PO TABS: 1.0000 | ORAL_TABLET | Freq: Four times a day (QID) | ORAL | 0 refills | Status: DC | PRN

## 2022-11-22 NOTE — Progress Notes (Signed)
Office Visit Note   Patient: [name] Fernandez           Date of Birth: 1940/11/01           MRN: [id number] Visit Date: 11/22/2022              Requested by: Lucky Cowboy, MD [address] [place],  [place] [address] PCP: Lucky Cowboy, MD  Chief Complaint  Patient presents with   Left Ankle - Routine Post Op    11/10/2022 left ankle debridement       HPI: Patient is an 82 year old gentleman status post repeat debridement left lateral ankle.  Postoperative cultures are showing Pseudomonas resistant to oral antibiotics.    Has seen infectious disease and is awaiting PICC placement.  Scheduled for Friday, in 2 days. Assessment & Plan: Visit Diagnoses:  No diagnosis found.   Plan:  Have again offered compression the patient cannot tolerate compression wrapping or garments to the lower extremity.   Patient will follow-up with infectious disease anticipate IV antibiotics.  Proceed with PICC placement on Friday.  We will call to see if we can get this moved up to tomorrow.   Patient will stop his oral antibiotics at this time.  Patient does have silver cell at home he will use these dressings for wound coverage after Dial soap cleansing.  Follow-Up Instructions: No follow-ups on file.   Ortho Exam  Patient is alert, oriented, no adenopathy, well-dressed, normal affect, normal respiratory effort.  Pitting edema to the tibial tubercle without cellulitis Examination the wound bed has fibrinous exudative tissue burden and some hypergranulation tissue over the malleolus he does not tolerate debridement.  Appear to be some ischemic changes to the most distal aspect of the wound scant serous drainage.  No surrounding erythema or warmth  Imaging: No results found. No images are attached to the encounter.  Labs: Lab Results  Component Value Date   HGBA1C 6.3 (H) 10/05/2022   HGBA1C 6.4 (H) 04/04/2022   HGBA1C 5.9 (H) 09/14/2021   ESRSEDRATE 32 (H) 06/15/2022    ESRSEDRATE 40 (H) 06/14/2022   CRP 0.8 06/15/2022   CRP <0.5 06/14/2022   LABURIC 7.8 05/20/2014   LABURIC 6.8 05/20/2013   REPTSTATUS 11/17/2022 FINAL 11/10/2022   GRAMSTAIN  11/10/2022    ABUNDANT SQUAMOUS EPITHELIAL CELLS PRESENT MODERATE WBC PRESENT, PREDOMINANTLY PMN ABUNDANT GRAM NEGATIVE RODS FEW GRAM POSITIVE COCCI    CULT  11/10/2022    ABUNDANT PSEUDOMONAS AERUGINOSA FEW ENTEROCOCCUS FAECALIS MODERATE FINEGOLDIA MAGNA Standardized susceptibility testing for this organism is not available. Performed at [facility] Lab, [address], [place], [place] [address]    LABORGA PSEUDOMONAS AERUGINOSA 11/10/2022   LABORGA ENTEROCOCCUS FAECALIS 11/10/2022     Lab Results  Component Value Date   ALBUMIN 2.9 (L) 06/16/2022   ALBUMIN 3.8 06/14/2022   ALBUMIN 4.0 09/25/2016   PREALBUMIN 18 06/14/2022    Lab Results  Component Value Date   MG 1.8 06/19/2022   MG 1.9 04/04/2022   MG 1.9 03/16/2021   Lab Results  Component Value Date   VD25OH 57 10/05/2022   VD25OH 59 04/04/2022   VD25OH 44 09/28/2020    Lab Results  Component Value Date   PREALBUMIN 18 06/14/2022      Latest Ref Rng & Units 11/10/2022    9:56 AM 10/05/2022   12:16 PM 06/20/2022    3:24 AM  CBC EXTENDED  WBC 4.0 - 10.5 K/uL 14.6  8.9  8.4  RBC 4.22 - 5.81 MIL/uL 3.92  4.00  3.44   Hemoglobin 13.0 - 17.0 g/dL 16.1  09.6  04.5   HCT 39.0 - 52.0 % 36.3  37.4  31.8   Platelets 150 - 400 K/uL 371  369  304   NEUT# 1,500 - 7,800 cells/uL  5,509    Lymph# 850 - 3,900 cells/uL  2,252       There is no height or weight on file to calculate BMI.  Orders:  No orders of the defined types were placed in this encounter.  Meds ordered this encounter  Medications   HYDROcodone-acetaminophen (NORCO) 10-325 MG tablet    Sig: Take 1 tablet by mouth every 6 (six) hours as needed.    Dispense:  30 tablet    Refill:  0      Procedures: No procedures performed  Clinical Data: No additional  findings.  ROS:  All other systems negative, except as noted in the HPI. Review of Systems  Objective: Vital Signs: There were no vitals taken for this visit.  Specialty Comments:  No specialty comments available.  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) 06/16/2022   Osteomyelitis (HCC) 06/16/2022   Tenosynovitis of tibialis posterior tendon 06/15/2022   Wound infection 06/15/2022   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   Anemia 03/13/2019   Chronic venous insufficiency 03/12/2019   CKD (chronic kidney disease) stage 2, 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/2015   COPD (chronic obstructive pulmonary disease) (HCC) 12/08/2015   Chronic pain syndrome 05/26/2015   DDD (degenerative disc disease), lumbar 05/26/2015   Medication management 05/26/2015   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   Lumbago with sciatica 08/06/2014   Hyperlipidemia 08/20/2013   Hypertension    Abnormal glucose    Vitamin D deficiency    Arthritis    Vitamin B12 deficiency    Celiac disease 06/19/2012   GERD (gastroesophageal reflux disease) 04/30/2012   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 tube) procedure 04/30/2012   Hypothyroidism 04/30/2012   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   Past Medical History:  Diagnosis Date   Anemia    Celiac disease    diagnoses 06/24/12   Chronic ulcer of ankle (HCC)    left   Esophageal stricture    GERD (gastroesophageal reflux disease)    Hiatal hernia    Hypertension    patient states " no current HTN"   Hypothyroidism    Pre-diabetes    Prediabetes    Shortness of breath    from oxycodone at times   Vitamin B12 deficiency    Vitamin D deficiency     Family History  Problem Relation Age of Onset   Esophageal cancer Father     Past Surgical History:  Procedure Laterality Date   APPENDECTOMY     BACK SURGERY     3  diff surgeries for vertebrea broken   EYE SURGERY     bilateral cataract surgery   HERNIA REPAIR  02/10/1949   bilateral groin as child   HIATAL HERNIA REPAIR     I & D EXTREMITY Left 06/16/2022   Procedure: IRRIGATION AND DEBRIDEMENT ANKLE;  Surgeon: Nadara Mustard, MD;  Location: [facility] OR;  Service: Orthopedics;  Laterality: Left;   I & D EXTREMITY Left 07/21/2022   Procedure: LEFT ANKLE DEBRIDEMENT;  Surgeon: Nadara Mustard, MD;  Location: [facility]  OR;  Service: Orthopedics;  Laterality: Left;   I & D EXTREMITY Left 11/10/2022   Procedure: LEFT ANKLE DEBRIDEMENT;  Surgeon: Nadara Mustard, MD;  Location: [facility] OR;  Service: Orthopedics;  Laterality: Left;   JOINT REPLACEMENT  04/12/2010   right knee replacement   TONSILLECTOMY     as child   TOTAL KNEE REVISION  11/20/2011   Procedure: TOTAL KNEE REVISION;  Surgeon: Shelda Pal, MD;  Location: [facility] ORS;  Service: Orthopedics;  Laterality: Right;  Right Total Knee Revision   Social History   Occupational History   Occupation: Retired    Associate Professor: [employer]  Tobacco Use   Smoking status: Former    Packs/day: 1.00    Years: 20.00    Additional pack years: 0.00    Total pack years: 20.00    Types: Cigarettes    Quit date: 06/27/1980    Years since quitting: 42.4    Passive exposure: Past   Smokeless tobacco: Never  Vaping Use   Vaping Use: Never used  Substance and Sexual Activity   Alcohol use: No   Drug use: No   Sexual activity: Yes

## 2022-11-23 ENCOUNTER — Other Ambulatory Visit: Payer: Self-pay | Admitting: Student

## 2022-11-23 ENCOUNTER — Telehealth: Payer: Self-pay

## 2022-11-23 NOTE — Telephone Encounter (Signed)
Patient's wife called office stating she has not heard from anyone regarding antibiotics. Is requesting antibiotics be sent out today if possible.  Spoke with Amy at [employer] and relayed patient's wife request to have antibiotics mailed before picc placement. Will check with Jeri Modena to see if they can do this. Will call office and patient's wife back.  Juanita Laster, RMA

## 2022-11-23 NOTE — Telephone Encounter (Signed)
Spoke to [facility] after scheduling patient for 6/14 PICC placement. She verified that patient can get first dose in the home. Meds will be sent the day before picc placment. See office notes for IV Abx information.

## 2022-11-24 ENCOUNTER — Ambulatory Visit (HOSPITAL_COMMUNITY): Payer: Medicare HMO

## 2022-11-24 ENCOUNTER — Ambulatory Visit: Payer: Medicare HMO | Admitting: Internal Medicine

## 2022-11-24 ENCOUNTER — Ambulatory Visit (HOSPITAL_COMMUNITY)
Admission: RE | Admit: 2022-11-24 | Discharge: 2022-11-24 | Disposition: A | Discharge status: Home / Self Care | Payer: Medicare HMO | Source: Ambulatory Visit | Attending: Internal Medicine | Admitting: Internal Medicine

## 2022-11-24 ENCOUNTER — Other Ambulatory Visit: Payer: Self-pay | Admitting: Internal Medicine

## 2022-11-24 DIAGNOSIS — T148XXA Other injury of unspecified body region, initial encounter: Secondary | ICD-10-CM | POA: Diagnosis not present

## 2022-11-24 DIAGNOSIS — L089 Local infection of the skin and subcutaneous tissue, unspecified: Secondary | ICD-10-CM

## 2022-11-24 DIAGNOSIS — X58XXXA Exposure to other specified factors, initial encounter: Secondary | ICD-10-CM | POA: Insufficient documentation

## 2022-11-24 DIAGNOSIS — M869 Osteomyelitis, unspecified: Secondary | ICD-10-CM | POA: Diagnosis not present

## 2022-11-24 DIAGNOSIS — Z452 Encounter for adjustment and management of vascular access device: Secondary | ICD-10-CM | POA: Diagnosis not present

## 2022-11-24 MED ORDER — HEPARIN SOD (PORK) LOCK FLUSH 100 UNIT/ML IV SOLN
INTRAVENOUS | Status: AC
  Filled 2022-11-24: qty 5

## 2022-11-24 MED ORDER — LIDOCAINE HCL 1 % IJ SOLN
INTRAMUSCULAR | Status: AC
  Filled 2022-11-24: qty 20

## 2022-11-24 NOTE — Procedures (Signed)
PROCEDURE SUMMARY:  Successful placement of single lumen PICC line to right brachial vein. Length 35 cm Tip at lower SVC/RA PICC capped No complications Ready for use  EBL < 5 mL   Izaan Kingbird H Kylii Ennis PA-C 11/24/2022, 2:17 PM

## 2022-11-25 DIAGNOSIS — L97329 Non-pressure chronic ulcer of left ankle with unspecified severity: Secondary | ICD-10-CM | POA: Diagnosis not present

## 2022-11-25 DIAGNOSIS — B965 Pseudomonas (aeruginosa) (mallei) (pseudomallei) as the cause of diseases classified elsewhere: Secondary | ICD-10-CM | POA: Diagnosis not present

## 2022-11-25 DIAGNOSIS — D631 Anemia in chronic kidney disease: Secondary | ICD-10-CM | POA: Diagnosis not present

## 2022-11-25 DIAGNOSIS — N182 Chronic kidney disease, stage 2 (mild): Secondary | ICD-10-CM | POA: Diagnosis not present

## 2022-11-25 DIAGNOSIS — Z48817 Encounter for surgical aftercare following surgery on the skin and subcutaneous tissue: Secondary | ICD-10-CM | POA: Diagnosis not present

## 2022-11-25 DIAGNOSIS — I129 Hypertensive chronic kidney disease with stage 1 through stage 4 chronic kidney disease, or unspecified chronic kidney disease: Secondary | ICD-10-CM | POA: Diagnosis not present

## 2022-11-25 DIAGNOSIS — E11622 Type 2 diabetes mellitus with other skin ulcer: Secondary | ICD-10-CM | POA: Diagnosis not present

## 2022-11-25 DIAGNOSIS — M65172 Other infective (teno)synovitis, left ankle and foot: Secondary | ICD-10-CM | POA: Diagnosis not present

## 2022-11-25 DIAGNOSIS — E1122 Type 2 diabetes mellitus with diabetic chronic kidney disease: Secondary | ICD-10-CM | POA: Diagnosis not present

## 2022-11-27 ENCOUNTER — Telehealth: Payer: Self-pay | Admitting: Orthopedic Surgery

## 2022-11-27 DIAGNOSIS — B965 Pseudomonas (aeruginosa) (mallei) (pseudomallei) as the cause of diseases classified elsewhere: Secondary | ICD-10-CM | POA: Diagnosis not present

## 2022-11-27 DIAGNOSIS — I129 Hypertensive chronic kidney disease with stage 1 through stage 4 chronic kidney disease, or unspecified chronic kidney disease: Secondary | ICD-10-CM | POA: Diagnosis not present

## 2022-11-27 DIAGNOSIS — Z48817 Encounter for surgical aftercare following surgery on the skin and subcutaneous tissue: Secondary | ICD-10-CM | POA: Diagnosis not present

## 2022-11-27 DIAGNOSIS — L97329 Non-pressure chronic ulcer of left ankle with unspecified severity: Secondary | ICD-10-CM | POA: Diagnosis not present

## 2022-11-27 DIAGNOSIS — M65172 Other infective (teno)synovitis, left ankle and foot: Secondary | ICD-10-CM | POA: Diagnosis not present

## 2022-11-27 DIAGNOSIS — E11622 Type 2 diabetes mellitus with other skin ulcer: Secondary | ICD-10-CM | POA: Diagnosis not present

## 2022-11-27 DIAGNOSIS — Z5181 Encounter for therapeutic drug level monitoring: Secondary | ICD-10-CM | POA: Diagnosis not present

## 2022-11-27 DIAGNOSIS — D631 Anemia in chronic kidney disease: Secondary | ICD-10-CM | POA: Diagnosis not present

## 2022-11-27 DIAGNOSIS — N182 Chronic kidney disease, stage 2 (mild): Secondary | ICD-10-CM | POA: Diagnosis not present

## 2022-11-27 DIAGNOSIS — Z792 Long term (current) use of antibiotics: Secondary | ICD-10-CM | POA: Diagnosis not present

## 2022-11-27 DIAGNOSIS — E1122 Type 2 diabetes mellitus with diabetic chronic kidney disease: Secondary | ICD-10-CM | POA: Diagnosis not present

## 2022-11-27 NOTE — Telephone Encounter (Signed)
Noted  

## 2022-11-27 NOTE — Telephone Encounter (Signed)
Received call from Toyna Hardin-nurse with [facility] advised she saw patient Saturday for IV antibiotics and wound care.   Patient refused to allow her to touch his left ankle and insist on caring for his ankle himself. Tonya advised patient will manage and care for his ankle himself.   The number to contact Tonya or leave a message is [phone] or [phone]

## 2022-12-01 DIAGNOSIS — M869 Osteomyelitis, unspecified: Secondary | ICD-10-CM | POA: Diagnosis not present

## 2022-12-04 DIAGNOSIS — L97329 Non-pressure chronic ulcer of left ankle with unspecified severity: Secondary | ICD-10-CM | POA: Diagnosis not present

## 2022-12-04 DIAGNOSIS — I129 Hypertensive chronic kidney disease with stage 1 through stage 4 chronic kidney disease, or unspecified chronic kidney disease: Secondary | ICD-10-CM | POA: Diagnosis not present

## 2022-12-04 DIAGNOSIS — M65172 Other infective (teno)synovitis, left ankle and foot: Secondary | ICD-10-CM | POA: Diagnosis not present

## 2022-12-04 DIAGNOSIS — E11622 Type 2 diabetes mellitus with other skin ulcer: Secondary | ICD-10-CM | POA: Diagnosis not present

## 2022-12-04 DIAGNOSIS — D631 Anemia in chronic kidney disease: Secondary | ICD-10-CM | POA: Diagnosis not present

## 2022-12-04 DIAGNOSIS — E1122 Type 2 diabetes mellitus with diabetic chronic kidney disease: Secondary | ICD-10-CM | POA: Diagnosis not present

## 2022-12-04 DIAGNOSIS — Z792 Long term (current) use of antibiotics: Secondary | ICD-10-CM | POA: Diagnosis not present

## 2022-12-04 DIAGNOSIS — Z5181 Encounter for therapeutic drug level monitoring: Secondary | ICD-10-CM | POA: Diagnosis not present

## 2022-12-04 DIAGNOSIS — Z48817 Encounter for surgical aftercare following surgery on the skin and subcutaneous tissue: Secondary | ICD-10-CM | POA: Diagnosis not present

## 2022-12-04 DIAGNOSIS — N182 Chronic kidney disease, stage 2 (mild): Secondary | ICD-10-CM | POA: Diagnosis not present

## 2022-12-04 DIAGNOSIS — B965 Pseudomonas (aeruginosa) (mallei) (pseudomallei) as the cause of diseases classified elsewhere: Secondary | ICD-10-CM | POA: Diagnosis not present

## 2022-12-05 ENCOUNTER — Encounter: Payer: Self-pay | Admitting: Internal Medicine

## 2022-12-05 ENCOUNTER — Ambulatory Visit: Payer: Medicare HMO | Admitting: Internal Medicine

## 2022-12-05 ENCOUNTER — Other Ambulatory Visit: Payer: Self-pay

## 2022-12-05 ENCOUNTER — Telehealth: Payer: Self-pay

## 2022-12-05 VITALS — BP 112/74 | HR 71 | Wt 172.0 lb

## 2022-12-05 DIAGNOSIS — M8660 Other chronic osteomyelitis, unspecified site: Secondary | ICD-10-CM | POA: Diagnosis not present

## 2022-12-05 NOTE — Telephone Encounter (Signed)
Per Dr. Drue Second patient's IV abx will be extended 6 weeks from today (01/16/23) She may possibly be changing the IV abx,but for now she would like him to decrease dose to 2gm Q 12 hr of Cefepime. Drue Second will update if medication needs to be changed.  I have updated our pharmacy team and [facility].  Tyus Kallam Jonathon Resides, CMA

## 2022-12-05 NOTE — Progress Notes (Unsigned)
RFV: left ankle osteomyelitis with PsA  Patient ID: [name] Fernandez, male   DOB: 11-01-1940, 82 y.o.   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 Seen by dr Luciana Axe on 6/5, had picc line placed on 6/14. Now roughly 12 days into IV Cefepime treatment Recent labs show cr of 1.46, sed rate of 30 and crp of 3. On 6/24.   He has a chronic ulcer and was followed by wound care center for the left ankle wound that started last year which was noted to be worsening in January of this year, despite multiple antibiotic courses.  MRI with tenosynovitis and underwent I and D and cultures with FQ resistant Pseudomonas and treated with cefepime for 6 weeks.  Wound was healing and he completed antibiotics.  He was followed by wound care but referred back to dr duda due to wound ischemic changes which led to I x D on 5/31 culture sent and positive for Pseudomonas again, cipro resistant.  (However, new + e.faecalis, and finegoldia)  Has been on 2g cefepime Q 8hr. Cr on labs increased from 1.1 to 1.46 He reports fatigue  QMV:HQION- diffuse rash  Outpatient Encounter Medications as of 12/05/2022  Medication Sig   acetaminophen (TYLENOL) 500 MG tablet Take 500-1,000 mg by mouth every 6 (six) hours as needed for mild pain or headache.   Ascorbic Acid (VITAMIN C) 1000 MG tablet Take 1,000 mg by mouth daily.   betamethasone valerate ointment (VALISONE) 0.1 % Apply 1 Application topically 2 (two) times daily. (Patient taking differently: Apply 1 Application topically daily as needed (Skin rash).)   Cholecalciferol (VITAMIN D3) 125 MCG (5000 UT) TABS Take 5,000 Units by mouth 2 (two) times daily.   clotrimazole-betamethasone (LOTRISONE) cream Apply 1 Application topically daily as needed (Rash).   dexamethasone (DECADRON) 4 MG tablet Take 1 tab 3 x day - 3 days, then 2 x day - 3 days, then 1 tab daily (Patient taking differently: Take 4 mg by mouth daily.)    gabapentin (NEURONTIN) 600 MG tablet Take 1/2 to 1 tablet 2 to 3 x /Daily as needed for Chronic Pain (Patient taking differently: Take 300-600 mg by mouth 3 (three) times daily as needed (as needed for Chronic Pain).)   HYDROcodone-acetaminophen (NORCO) 10-325 MG tablet Take 1 tablet by mouth every 6 (six) hours as needed.   hydrOXYzine (ATARAX) 25 MG tablet Take  1 tablet  3 x /day  as needed  for Anxiety or Sleep (Patient taking differently: Take 50 mg by mouth 3 (three) times daily as needed for itching (Sleep).)   levofloxacin (LEVAQUIN) 750 MG tablet Take 1 tablet (750 mg total) by mouth daily. (Patient not taking: Reported on 11/15/2022)   naproxen sodium (ALEVE) 220 MG tablet Take 220 mg by mouth at bedtime as needed (Muscle pain).   NON FORMULARY Take 2 capsules by mouth daily. Thyroid Complete   omeprazole (PRILOSEC OTC) 20 MG tablet Take 20 mg by mouth daily before breakfast.   polyethylene glycol (MIRALAX / GLYCOLAX) 17 g packet Take 17 g by mouth daily. (Patient not taking: Reported on 07/13/2022)   Red Yeast Rice Extract (RED YEAST RICE PO) Take 4,560 mg by mouth daily. 1140 mg each   senna-docusate (SENOKOT-S) 8.6-50 MG tablet Take 2 tablets by mouth 2 (two) times daily. (Patient taking differently: Take 2 tablets by mouth at bedtime.)   silver sulfADIAZINE (SILVADENE) 1 % cream Apply 1  Application topically daily. Apply to affected area daily plus dry dressing   torsemide (DEMADEX) 20 MG tablet Take  1 tablet  Daily for Fluid Retention / Leg Swelling (Patient taking differently: Take 10-20 mg by mouth daily. Fluid Retention / Leg Swelling)   triamcinolone cream (KENALOG) 0.1 % APPLY TO THE LEFT LEG UNDER THE WRAP DAILY WHERE THERE IS ITCHING OR RASH X 2 WEEKS OR UNTIL HEALED   zinc gluconate 50 MG tablet Take 50 mg by mouth daily.   No facility-administered encounter medications on file as of 12/05/2022.     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) 06/16/2022   Osteomyelitis (HCC) 06/16/2022   Tenosynovitis of tibialis posterior tendon 06/15/2022   Wound infection 06/15/2022   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   Anemia 03/13/2019   Chronic venous insufficiency 03/12/2019   CKD (chronic kidney disease) stage 2, 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/2015   COPD (chronic obstructive pulmonary disease) (HCC) 12/08/2015   Chronic pain syndrome 05/26/2015   DDD (degenerative disc disease), lumbar 05/26/2015   Medication management 05/26/2015   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   Lumbago with sciatica 08/06/2014   Hyperlipidemia 08/20/2013   Hypertension    Abnormal glucose    Vitamin D deficiency    Arthritis    Vitamin B12 deficiency    Celiac disease 06/19/2012   GERD (gastroesophageal reflux disease) 04/30/2012   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 tube) procedure 04/30/2012   Hypothyroidism 04/30/2012   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 Fatigue from what he believes in abtx Physical Exam   BP (!) 140/80   Pulse 73   Wt 172 lb (78 kg)   SpO2 97%   BMI 26.94 kg/m   Physical Exam  Constitutional: He is oriented to person, place, and time. He appears well-developed and well-nourished. No distress.  HENT:  Mouth/Throat: Oropharynx is clear and moist. No oropharyngeal exudate.  Cardiovascular: Normal rate, regular rhythm and normal heart sounds. Exam reveals no gallop and no friction rub.  No murmur heard.  Pulmonary/Chest: Effort normal and breath sounds normal. No respiratory distress. He has no  wheezes.  Abdominal: Soft. Bowel sounds are normal. He exhibits no distension. There is no tenderness.  Ext: picc line is c/d/i Neurological: He is alert and oriented to person, place, and time.  Skin: Skin is warm and dry. See attached picture Psychiatric: He has a normal mood and affect. His behavior is normal.    CBC Lab Results  Component Value Date   WBC 14.6 (H) 11/10/2022   RBC 3.92 (L) 11/10/2022   HGB 12.5 (L) 11/10/2022   HCT 36.3 (L) 11/10/2022   PLT 371 11/10/2022   MCV 92.6 11/10/2022   MCH 31.9 11/10/2022   MCHC 34.4 11/10/2022   RDW 14.5 11/10/2022   LYMPHSABS 2,252 10/05/2022   MONOABS 0.6 06/15/2022   EOSABS 338 10/05/2022    BMET Lab Results  Component Value Date   NA 131 (L) 11/10/2022  K 3.5 11/10/2022   CL 96 (L) 11/10/2022   CO2 25 11/10/2022   GLUCOSE 108 (H) 11/10/2022   BUN 34 (H) 11/10/2022   CREATININE 1.16 11/10/2022   CALCIUM 8.9 11/10/2022   GFRNONAA >60 11/10/2022   GFRAA 75 09/28/2020    Pseudomonas aeruginosa Enterococcus faecalis      MIC MIC    AMPICILLIN   <=2 SENSITIVE Sensitive    CEFEPIME 2 SENSITIVE Sensitive      CEFTAZIDIME <=1 SENSITIVE Sensitive      CIPROFLOXACIN >=4 RESISTANT Resistant      GENTAMICIN 4 SENSITIVE Sensitive      GENTAMICIN SYNERGY   SENSITIVE Sensitive    IMIPENEM 1 SENSITIVE Sensitive      PIP/TAZO <=4 SENSITIVE Sensitive      VANCOMYCIN   1 SENSITIVE Sensitive       Component 3 wk ago  Specimen Description WOUND  Special Requests LEFT ANKLE  Gram Stain ABUNDANT SQUAMOUS EPITHELIAL CELLS PRESENT MODERATE WBC PRESENT, PREDOMINANTLY PMN ABUNDANT GRAM NEGATIVE RODS FEW GRAM POSITIVE COCCI  Culture ABUNDANT PSEUDOMONAS AERUGINOSA FEW ENTEROCOCCUS FAECALIS MODERATE FINEGOLDIA MAGNA     Assessment and Plan  Recommend to decrease cefepime dose to 2gm Q 12 hr; plan to change to piptazo to cover efaecalis and finegoldia;  We will plan to change abtx regimen:  Diagnosis: Polymicrobial    Culture Result: e.faecalis, finegoldia, and PsA  Allergies  Allergen Reactions   Feraheme [Ferumoxytol] Swelling and Other (See Comments)    Swelling of lips and tongue, could not talk 30 minutes after the infusion.   Latex Other (See Comments)    Latex butterfly bandages tore off the skin and caused terrible blisters   Sulfa Antibiotics Hives    Severe itching, blisters   Wound Dressing Adhesive Other (See Comments)    Latex butterfly bandages tore off the skin and caused terrible blisters   Levothyroxine Swelling and Other (See Comments)    Lip swelling   Doxycycline Nausea Only    OPAT Orders Discharge antibiotics to be given via PICC line Discharge antibiotics: Per pharmacy protocol piptazo per protocol   Duration: 6 wk End Date: 01/16/2023  [facility] Care Per Protocol:  Home health RN for IV administration and teaching; PICC line care and labs.    Labs weekly while on IV antibiotics: _x_ CBC with differential _x_ BMP  _x_ CRP _x_ ESR   _x_ Please pull PIC at completion of IV antibiotics  Fax weekly labs to [phone]  Clinic Follow Up Appt: Per AVS, in 2 and 6 wk  I have personally spent 45 minutes involved in face-to-face and non-face-to-face activities for this patient on the day of the visit. Professional time spent includes the following activities: Preparing to see the patient (review of tests), Obtaining and/or reviewing separately obtained history (admission/discharge record), Performing a medically appropriate examination and/or evaluation , Ordering medications/tests/procedures, referring and communicating with other health care professionals, Documenting clinical information in the EMR, Independently interpreting results (not separately reported), Communicating results to the patient/family/caregiver, Counseling and educating the patient/family/caregiver and Care coordination (not separately reported).

## 2022-12-06 ENCOUNTER — Telehealth: Payer: Self-pay | Admitting: Pharmacist

## 2022-12-06 NOTE — Telephone Encounter (Addendum)
Scr from faxed labs on 6/24 demonstrated increased Scr from 1.05 to 1.46 (CrCl ~60 ml/min to CrCl ~44 ml/min). Patient should be receiving cefepime 2g q12hr at this time which would be the appropriate dose to continue as his CrCl remains > 30 ml/min. Contacted [name] with [facility] to ensure appropriate dosing. Will continue monitoring Scr next week.  Addendum: Will be transitioning to Zosyn 3.375g q8hr per Dr. Drue Second as his cultures are now growing E .faecalis and Finegoldia. Standard dosing remains appropriate for Zosyn as his CrCl is above 20 ml/min. Informed HH of this change.   Margarite Gouge, PharmD, CPP, BCIDP, AAHIVP Clinical Pharmacist Practitioner Infectious Diseases Clinical Pharmacist [facility]

## 2022-12-07 ENCOUNTER — Encounter: Payer: Self-pay | Admitting: Orthopedic Surgery

## 2022-12-07 ENCOUNTER — Telehealth: Payer: Self-pay | Admitting: Pharmacist

## 2022-12-07 ENCOUNTER — Ambulatory Visit (INDEPENDENT_AMBULATORY_CARE_PROVIDER_SITE_OTHER): Payer: Medicare HMO | Admitting: Orthopedic Surgery

## 2022-12-07 DIAGNOSIS — Z48817 Encounter for surgical aftercare following surgery on the skin and subcutaneous tissue: Secondary | ICD-10-CM | POA: Diagnosis not present

## 2022-12-07 DIAGNOSIS — I129 Hypertensive chronic kidney disease with stage 1 through stage 4 chronic kidney disease, or unspecified chronic kidney disease: Secondary | ICD-10-CM | POA: Diagnosis not present

## 2022-12-07 DIAGNOSIS — N182 Chronic kidney disease, stage 2 (mild): Secondary | ICD-10-CM | POA: Diagnosis not present

## 2022-12-07 DIAGNOSIS — L97322 Non-pressure chronic ulcer of left ankle with fat layer exposed: Secondary | ICD-10-CM

## 2022-12-07 DIAGNOSIS — D631 Anemia in chronic kidney disease: Secondary | ICD-10-CM | POA: Diagnosis not present

## 2022-12-07 DIAGNOSIS — E1122 Type 2 diabetes mellitus with diabetic chronic kidney disease: Secondary | ICD-10-CM | POA: Diagnosis not present

## 2022-12-07 DIAGNOSIS — M65172 Other infective (teno)synovitis, left ankle and foot: Secondary | ICD-10-CM | POA: Diagnosis not present

## 2022-12-07 DIAGNOSIS — B965 Pseudomonas (aeruginosa) (mallei) (pseudomallei) as the cause of diseases classified elsewhere: Secondary | ICD-10-CM | POA: Diagnosis not present

## 2022-12-07 DIAGNOSIS — E11622 Type 2 diabetes mellitus with other skin ulcer: Secondary | ICD-10-CM | POA: Diagnosis not present

## 2022-12-07 DIAGNOSIS — L97329 Non-pressure chronic ulcer of left ankle with unspecified severity: Secondary | ICD-10-CM | POA: Diagnosis not present

## 2022-12-07 NOTE — Telephone Encounter (Signed)
[name] called this morning and stated that patient's nurse called her to state that the patient's wife said they did not have time to start new antibiotic today. I stated that it would be in patient's best interest to start ASAP as there is new information regarding what is growing in his culture and cefepime does not cover one of the bacteria found. [name] relayed message and nurse will go out this evening to start first dose of Zosyn.   [name] Fernandez L. [name], PharmD, BCIDP, AAHIVP, CPP Clinical Pharmacist Practitioner Infectious Diseases Clinical Pharmacist [facility] 12/07/2022, 11:28 AM

## 2022-12-07 NOTE — Progress Notes (Signed)
Office Visit Note   Patient: [name] Fernandez           Date of Birth: [birth year]           MRN: [id number] Visit Date: 12/07/2022              Requested by: Lucky Cowboy, MD [address] [place],  [place] [address] PCP: Lucky Cowboy, MD  Chief Complaint  Patient presents with   Left Ankle - Routine Post Op    11/10/2022 left ankle debridement       HPI: Patient is an 82 year old gentleman who is seen in follow-up status post left ankle debridement.  He is 4 weeks out.  He is currently on antibiotics through a PICC line managed by infectious disease.  Assessment & Plan: Visit Diagnoses:  1. 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  Discussed that if he is able to gently debride the fibrinous exudative tissue we could proceed with Kerecis tissue graft in the office at follow-up.  Follow-Up Instructions: Return in about 2 weeks (around 12/21/2022).   Ortho Exam  Patient is alert, oriented, no adenopathy, well-dressed, normal affect, normal respiratory effort. Examination there is approximately 50% fibrinous exudative tissue over the granulation tissue.  There is no surrounding cellulitis there is no dermatitis there is minimal swelling.  Patient states that he feels bad from his antibiotics.  Imaging: No results found.    Labs: Lab Results  Component Value Date   HGBA1C 6.3 (H) 10/05/2022   HGBA1C 6.4 (H) 04/04/2022   HGBA1C 5.9 (H) 09/14/2021   ESRSEDRATE 32 (H) 06/15/2022   ESRSEDRATE 40 (H) 06/14/2022   CRP 0.8 06/15/2022   CRP <0.5 06/14/2022   LABURIC 7.8 05/20/2014   LABURIC 6.8 05/20/2013   REPTSTATUS 11/17/2022 FINAL 11/10/2022   GRAMSTAIN  11/10/2022    ABUNDANT SQUAMOUS EPITHELIAL CELLS PRESENT MODERATE WBC PRESENT, PREDOMINANTLY PMN ABUNDANT GRAM NEGATIVE RODS FEW GRAM POSITIVE COCCI    CULT  11/10/2022    ABUNDANT PSEUDOMONAS AERUGINOSA FEW  ENTEROCOCCUS FAECALIS MODERATE FINEGOLDIA MAGNA Standardized susceptibility testing for this organism is not available. Performed at [facility], [address], [place], [place] [address]    LABORGA PSEUDOMONAS AERUGINOSA 11/10/2022   LABORGA ENTEROCOCCUS FAECALIS 11/10/2022     Lab Results  Component Value Date   ALBUMIN 2.9 (L) 06/16/2022   ALBUMIN 3.8 06/14/2022   ALBUMIN 4.0 09/25/2016   PREALBUMIN 18 06/14/2022    Lab Results  Component Value Date   MG 1.8 06/19/2022   MG 1.9 04/04/2022   MG 1.9 03/16/2021   Lab Results  Component Value Date   VD25OH 57 10/05/2022   VD25OH 59 04/04/2022   VD25OH 44 09/28/2020    Lab Results  Component Value Date   PREALBUMIN 18 06/14/2022      Latest Ref Rng & Units 11/10/2022    9:56 AM 10/05/2022   12:16 PM 06/20/2022    3:24 AM  CBC EXTENDED  WBC 4.0 - 10.5 K/uL 14.6  8.9  8.4   RBC 4.22 - 5.81 MIL/uL 3.92  4.00  3.44   Hemoglobin 13.0 - 17.0 g/dL 82.9  56.2  13.0   HCT 39.0 - 52.0 % 36.3  37.4  31.8   Platelets 150 - 400 K/uL 371  369  304   NEUT# 1,500 - 7,800 cells/uL  5,509    Lymph# 850 -  3,900 cells/uL  2,252       There is no height or weight on file to calculate BMI.  Orders:  No orders of the defined types were placed in this encounter.  No orders of the defined types were placed in this encounter.    Procedures: No procedures performed  Clinical Data: No additional findings.  ROS:  All other systems negative, except as noted in the HPI. Review of Systems  Objective: Vital Signs: There were no vitals taken for this visit.  Specialty Comments:  No specialty comments available.  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) 06/16/2022   Osteomyelitis (HCC) 06/16/2022   Tenosynovitis of tibialis posterior tendon 06/15/2022   Wound infection 06/15/2022    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   Anemia 03/13/2019   Chronic venous insufficiency 03/12/2019   CKD (chronic kidney disease) stage 2, 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/2015   COPD (chronic obstructive pulmonary disease) (HCC) 12/08/2015   Chronic pain syndrome 05/26/2015   DDD (degenerative disc disease), lumbar 05/26/2015   Medication management 05/26/2015   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   Lumbago with sciatica 08/06/2014   Hyperlipidemia 08/20/2013   Hypertension    Abnormal glucose    Vitamin D deficiency    Arthritis    Vitamin B12 deficiency    Celiac disease 06/19/2012   GERD (gastroesophageal reflux disease) 04/30/2012   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 tube) procedure 04/30/2012   Hypothyroidism 04/30/2012   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   Past Medical History:  Diagnosis Date   Anemia    Celiac disease    diagnoses 06/24/12   Chronic ulcer of ankle (HCC)    left   Esophageal stricture    GERD (gastroesophageal reflux disease)    Hiatal hernia    Hypertension    patient states " no current HTN"   Hypothyroidism    Pre-diabetes    Prediabetes    Shortness of breath    from oxycodone at times   Vitamin B12 deficiency    Vitamin D deficiency     Family History  Problem Relation Age of Onset   Esophageal cancer Father     Past Surgical History:  Procedure Laterality Date   APPENDECTOMY     BACK SURGERY     3 diff surgeries for vertebrea broken   EYE SURGERY     bilateral cataract surgery   HERNIA REPAIR  02/10/1949   bilateral groin as child   HIATAL HERNIA REPAIR     I & D EXTREMITY Left 06/16/2022   Procedure: IRRIGATION AND DEBRIDEMENT ANKLE;  Surgeon: Nadara Mustard, MD;  Location: [facility] OR;  Service: Orthopedics;  Laterality: Left;   I & D EXTREMITY Left 07/21/2022   Procedure: LEFT ANKLE DEBRIDEMENT;  Surgeon: Nadara Mustard, MD;  Location: [facility] OR;  Service: Orthopedics;  Laterality: Left;   I & D EXTREMITY Left 11/10/2022    Procedure: LEFT ANKLE DEBRIDEMENT;  Surgeon: Nadara Mustard, MD;  Location: [facility] OR;  Service: Orthopedics;  Laterality: Left;   JOINT REPLACEMENT  04/12/2010   right knee replacement   TONSILLECTOMY     as child   TOTAL KNEE REVISION  11/20/2011   Procedure: TOTAL KNEE REVISION;  Surgeon: Shelda Pal, MD;  Location: [facility] ORS;  Service: Orthopedics;  Laterality: Right;  Right Total Knee Revision  Social History   Occupational History   Occupation: Retired    Associate Professor: [employer]  Tobacco Use   Smoking status: Former    Packs/day: 1.00    Years: 20.00    Additional pack years: 0.00    Total pack years: 20.00    Types: Cigarettes    Quit date: 06/27/1980    Years since quitting: 42.4    Passive exposure: Past   Smokeless tobacco: Never  Vaping Use   Vaping Use: Never used  Substance and Sexual Activity   Alcohol use: No   Drug use: No   Sexual activity: Yes

## 2022-12-08 DIAGNOSIS — D631 Anemia in chronic kidney disease: Secondary | ICD-10-CM | POA: Diagnosis not present

## 2022-12-08 DIAGNOSIS — M869 Osteomyelitis, unspecified: Secondary | ICD-10-CM | POA: Diagnosis not present

## 2022-12-08 DIAGNOSIS — M65172 Other infective (teno)synovitis, left ankle and foot: Secondary | ICD-10-CM | POA: Diagnosis not present

## 2022-12-08 DIAGNOSIS — Z48817 Encounter for surgical aftercare following surgery on the skin and subcutaneous tissue: Secondary | ICD-10-CM | POA: Diagnosis not present

## 2022-12-08 DIAGNOSIS — I129 Hypertensive chronic kidney disease with stage 1 through stage 4 chronic kidney disease, or unspecified chronic kidney disease: Secondary | ICD-10-CM | POA: Diagnosis not present

## 2022-12-08 DIAGNOSIS — N182 Chronic kidney disease, stage 2 (mild): Secondary | ICD-10-CM | POA: Diagnosis not present

## 2022-12-08 DIAGNOSIS — E1122 Type 2 diabetes mellitus with diabetic chronic kidney disease: Secondary | ICD-10-CM | POA: Diagnosis not present

## 2022-12-08 DIAGNOSIS — L97329 Non-pressure chronic ulcer of left ankle with unspecified severity: Secondary | ICD-10-CM | POA: Diagnosis not present

## 2022-12-08 DIAGNOSIS — B965 Pseudomonas (aeruginosa) (mallei) (pseudomallei) as the cause of diseases classified elsewhere: Secondary | ICD-10-CM | POA: Diagnosis not present

## 2022-12-08 DIAGNOSIS — E11622 Type 2 diabetes mellitus with other skin ulcer: Secondary | ICD-10-CM | POA: Diagnosis not present

## 2022-12-11 ENCOUNTER — Telehealth: Payer: Self-pay | Admitting: Family

## 2022-12-11 DIAGNOSIS — M65172 Other infective (teno)synovitis, left ankle and foot: Secondary | ICD-10-CM | POA: Diagnosis not present

## 2022-12-11 DIAGNOSIS — I129 Hypertensive chronic kidney disease with stage 1 through stage 4 chronic kidney disease, or unspecified chronic kidney disease: Secondary | ICD-10-CM | POA: Diagnosis not present

## 2022-12-11 DIAGNOSIS — E1122 Type 2 diabetes mellitus with diabetic chronic kidney disease: Secondary | ICD-10-CM | POA: Diagnosis not present

## 2022-12-11 DIAGNOSIS — E11622 Type 2 diabetes mellitus with other skin ulcer: Secondary | ICD-10-CM | POA: Diagnosis not present

## 2022-12-11 DIAGNOSIS — Z5181 Encounter for therapeutic drug level monitoring: Secondary | ICD-10-CM | POA: Diagnosis not present

## 2022-12-11 DIAGNOSIS — D631 Anemia in chronic kidney disease: Secondary | ICD-10-CM | POA: Diagnosis not present

## 2022-12-11 DIAGNOSIS — Z48817 Encounter for surgical aftercare following surgery on the skin and subcutaneous tissue: Secondary | ICD-10-CM | POA: Diagnosis not present

## 2022-12-11 DIAGNOSIS — B965 Pseudomonas (aeruginosa) (mallei) (pseudomallei) as the cause of diseases classified elsewhere: Secondary | ICD-10-CM | POA: Diagnosis not present

## 2022-12-11 DIAGNOSIS — N182 Chronic kidney disease, stage 2 (mild): Secondary | ICD-10-CM | POA: Diagnosis not present

## 2022-12-11 DIAGNOSIS — Z792 Long term (current) use of antibiotics: Secondary | ICD-10-CM | POA: Diagnosis not present

## 2022-12-11 DIAGNOSIS — L97329 Non-pressure chronic ulcer of left ankle with unspecified severity: Secondary | ICD-10-CM | POA: Diagnosis not present

## 2022-12-11 NOTE — Telephone Encounter (Signed)
Pt called in stating he needs the list of medical supplies ordered, pt stated he gave the list to the nurse last Thursday. Please advise

## 2022-12-12 ENCOUNTER — Telehealth: Payer: Self-pay | Admitting: Family

## 2022-12-12 DIAGNOSIS — L97929 Non-pressure chronic ulcer of unspecified part of left lower leg with unspecified severity: Secondary | ICD-10-CM | POA: Diagnosis not present

## 2022-12-12 NOTE — Telephone Encounter (Signed)
Patient called advised he spoke with [facility] and they do not have the order for the supplies. Patient said he is running out of supplies. Patient said he is suppose to use Silver alginate dressing. Patient said the last order [facility] was in Oct 25, 2022. Patient said [facility] is sending all the supplies he need except the Silver Alginate dressing.  Patient asked if the order for Silver Alginate dressing Ag can be called in today. He said [facility] will send it today.    The number to contact patient is [phone]

## 2022-12-12 NOTE — Telephone Encounter (Signed)
Pt informed

## 2022-12-12 NOTE — Telephone Encounter (Signed)
Pt called back again, asking where his supplies are. I put the order in to [facility] on 12/08/22. He was advised to call [facility] and ask for status update.

## 2022-12-12 NOTE — Telephone Encounter (Signed)
Order that was sent was for gauze roll, 4x4 gauze and silver alginate. Not sure why they didn't include the silver. I will call them and try to figure it out.

## 2022-12-12 NOTE — Telephone Encounter (Signed)
SW [facility] rep. She says they had old order from May, not the new one from 6/28 in which I got a confirmation email from them afterwards. She updated the order for 4x4 gauze bulky gauze roll and silver alginate. They are sending over the order to be signed and then I will fax back and they will work on getting the order sent to pt quickly.

## 2022-12-13 ENCOUNTER — Telehealth: Payer: Self-pay | Admitting: Pharmacist

## 2022-12-13 NOTE — Telephone Encounter (Signed)
Spoke to Jeri Modena, RN with [employer] and Clent Demark, Pharmacist at [facility] this morning. Patient's renal function continues to decline.  SCr:  6/17: 1.05; CrCl 60 ml/min 6/24: 1.46; CrCl 44 ml/min 7/1: 1.81; CrCl 35 ml/min  He is currently getting Zosyn 3.375 mg IV q8h. This dose is good until a CrCrl of ~20 ml/min. Discussed this with Amy. Advised that the dose is still ok to continue but that I would let Dr. Drue Second know that his kidney function continues to get worse. He has a follow up scheduled with Dr. Drue Second next week on 7/9.   Pola Furno L. Arlinda Barcelona, PharmD, BCIDP, AAHIVP, CPP Clinical Pharmacist Practitioner Infectious Diseases Clinical Pharmacist [facility] 12/13/2022, 9:44 AM

## 2022-12-15 DIAGNOSIS — M869 Osteomyelitis, unspecified: Secondary | ICD-10-CM | POA: Diagnosis not present

## 2022-12-16 ENCOUNTER — Telehealth: Payer: Self-pay | Admitting: Infectious Disease

## 2022-12-16 ENCOUNTER — Other Ambulatory Visit: Payer: Self-pay | Admitting: Internal Medicine

## 2022-12-16 DIAGNOSIS — F419 Anxiety disorder, unspecified: Secondary | ICD-10-CM

## 2022-12-16 MED ORDER — FLUCONAZOLE 100 MG PO TABS
100.0000 mg | ORAL_TABLET | Freq: Every day | ORAL | 0 refills | Status: DC
Start: 2022-12-16 — End: 2023-01-10

## 2022-12-16 NOTE — Telephone Encounter (Signed)
Patient's wife called re him having sores in edges of his mouth as well as worsening lethargy  I recommended him going to an ER or Urgent care but patient will not do so  I will call in fluconazole to his pharmacy for possible oral candidiasis  No repeat labs are going to be done until Monday  Renal fxn was worsening in past week

## 2022-12-18 ENCOUNTER — Telehealth: Payer: Self-pay

## 2022-12-18 DIAGNOSIS — E1122 Type 2 diabetes mellitus with diabetic chronic kidney disease: Secondary | ICD-10-CM | POA: Diagnosis not present

## 2022-12-18 DIAGNOSIS — B965 Pseudomonas (aeruginosa) (mallei) (pseudomallei) as the cause of diseases classified elsewhere: Secondary | ICD-10-CM | POA: Diagnosis not present

## 2022-12-18 DIAGNOSIS — D631 Anemia in chronic kidney disease: Secondary | ICD-10-CM | POA: Diagnosis not present

## 2022-12-18 DIAGNOSIS — Z5181 Encounter for therapeutic drug level monitoring: Secondary | ICD-10-CM | POA: Diagnosis not present

## 2022-12-18 DIAGNOSIS — M65172 Other infective (teno)synovitis, left ankle and foot: Secondary | ICD-10-CM | POA: Diagnosis not present

## 2022-12-18 DIAGNOSIS — E11622 Type 2 diabetes mellitus with other skin ulcer: Secondary | ICD-10-CM | POA: Diagnosis not present

## 2022-12-18 DIAGNOSIS — L97329 Non-pressure chronic ulcer of left ankle with unspecified severity: Secondary | ICD-10-CM | POA: Diagnosis not present

## 2022-12-18 DIAGNOSIS — I129 Hypertensive chronic kidney disease with stage 1 through stage 4 chronic kidney disease, or unspecified chronic kidney disease: Secondary | ICD-10-CM | POA: Diagnosis not present

## 2022-12-18 DIAGNOSIS — Z48817 Encounter for surgical aftercare following surgery on the skin and subcutaneous tissue: Secondary | ICD-10-CM | POA: Diagnosis not present

## 2022-12-18 DIAGNOSIS — Z792 Long term (current) use of antibiotics: Secondary | ICD-10-CM | POA: Diagnosis not present

## 2022-12-18 DIAGNOSIS — N182 Chronic kidney disease, stage 2 (mild): Secondary | ICD-10-CM | POA: Diagnosis not present

## 2022-12-18 NOTE — Telephone Encounter (Signed)
Spoke to patient wife - patient picked up RX for fluconazole and mouth sores have improved.    Christo Hain Lesli Albee, CMA

## 2022-12-18 NOTE — Telephone Encounter (Signed)
Left voicemail asking patient wife [name] Fernandez to return my call.  [name], CMA

## 2022-12-19 ENCOUNTER — Telehealth: Payer: Self-pay

## 2022-12-19 ENCOUNTER — Ambulatory Visit: Payer: Medicare HMO | Admitting: Internal Medicine

## 2022-12-19 ENCOUNTER — Encounter: Payer: Self-pay | Admitting: Internal Medicine

## 2022-12-19 ENCOUNTER — Other Ambulatory Visit: Payer: Self-pay

## 2022-12-19 VITALS — BP 145/67 | HR 57 | Temp 98.0°F | Ht 66.5 in | Wt 170.0 lb

## 2022-12-19 DIAGNOSIS — S91009D Unspecified open wound, unspecified ankle, subsequent encounter: Secondary | ICD-10-CM | POA: Diagnosis not present

## 2022-12-19 DIAGNOSIS — M8660 Other chronic osteomyelitis, unspecified site: Secondary | ICD-10-CM | POA: Diagnosis not present

## 2022-12-19 DIAGNOSIS — L089 Local infection of the skin and subcutaneous tissue, unspecified: Secondary | ICD-10-CM

## 2022-12-19 DIAGNOSIS — N179 Acute kidney failure, unspecified: Secondary | ICD-10-CM

## 2022-12-19 NOTE — Telephone Encounter (Signed)
Called Beth back. No answer, left HIPAA compliant VM stating that fluconazole is a short term medication (only 10 day Rx) and the hydroxyzine and hydrocodone-acetaminophen are both PRN. Ok to take all three together.   Mekaila Tarnow L. Venise Ellingwood, PharmD [facility] Clinical Pharmacist Practitioner

## 2022-12-19 NOTE — Progress Notes (Signed)
Rfv: chronic left ankle wound-osteomyelitis  Patient ID: [name] Fernandez, male   DOB: 1941-03-17, 82 y.o.   MRN: [id number]  HPI Patient is an 82 year old gentleman who is seen in follow-up status post debridement chronic left ankle wound. Initially on cefepime for PsA. However, on follow up on 6/25, cx now polymicrobial and his abtx have been changed to piptazo through 8/6. He did have some aki with cefepime.Marland Kitchen He started to have oral mouth sores for which he has been started on fluconazole 3- days ago for which he has started to notice improvement.   Micro:  ABUNDANT PSEUDOMONAS AERUGINOSA FEW ENTEROCOCCUS FAECALIS MODERATE FINEGOLDIA MAGNA    Outpatient Encounter Medications as of 12/19/2022  Medication Sig   acetaminophen (TYLENOL) 500 MG tablet Take 500-1,000 mg by mouth every 6 (six) hours as needed for mild pain or headache.   Ascorbic Acid (VITAMIN C) 1000 MG tablet Take 1,000 mg by mouth daily.   betamethasone valerate ointment (VALISONE) 0.1 % Apply 1 Application topically 2 (two) times daily. (Patient taking differently: Apply 1 Application topically daily as needed (Skin rash).)   Cholecalciferol (VITAMIN D3) 125 MCG (5000 UT) TABS Take 5,000 Units by mouth 2 (two) times daily.   clotrimazole-betamethasone (LOTRISONE) cream Apply 1 Application topically daily as needed (Rash).   dexamethasone (DECADRON) 4 MG tablet Take 1 tab 3 x day - 3 days, then 2 x day - 3 days, then 1 tab daily (Patient taking differently: Take 4 mg by mouth daily.)   fluconazole (DIFLUCAN) 100 MG tablet Take 1 tablet (100 mg total) by mouth daily.   gabapentin (NEURONTIN) 600 MG tablet Take 1/2 to 1 tablet 2 to 3 x /Daily as needed for Chronic Pain (Patient taking differently: Take 300-600 mg by mouth 3 (three) times daily as needed (as needed for Chronic Pain).)   HYDROcodone-acetaminophen (NORCO) 10-325 MG tablet Take 1 tablet by mouth every 6 (six) hours as needed.   hydrOXYzine (ATARAX) 25 MG tablet  TAKE 1 TABLET 3 X /DAY AS NEEDED FOR ANXIETY OR SLEEP   levofloxacin (LEVAQUIN) 750 MG tablet Take 1 tablet (750 mg total) by mouth daily.   naproxen sodium (ALEVE) 220 MG tablet Take 220 mg by mouth at bedtime as needed (Muscle pain).   NON FORMULARY Take 2 capsules by mouth daily. Thyroid Complete   omeprazole (PRILOSEC OTC) 20 MG tablet Take 20 mg by mouth daily before breakfast.   polyethylene glycol (MIRALAX / GLYCOLAX) 17 g packet Take 17 g by mouth daily.   Red Yeast Rice Extract (RED YEAST RICE PO) Take 4,560 mg by mouth daily. 1140 mg each   senna-docusate (SENOKOT-S) 8.6-50 MG tablet Take 2 tablets by mouth 2 (two) times daily. (Patient taking differently: Take 2 tablets by mouth at bedtime.)   silver sulfADIAZINE (SILVADENE) 1 % cream Apply 1 Application topically daily. Apply to affected area daily plus dry dressing   torsemide (DEMADEX) 20 MG tablet Take  1 tablet  Daily for Fluid Retention / Leg Swelling (Patient taking differently: Take 10-20 mg by mouth daily. Fluid Retention / Leg Swelling)   triamcinolone cream (KENALOG) 0.1 % APPLY TO THE LEFT LEG UNDER THE WRAP DAILY WHERE THERE IS ITCHING OR RASH X 2 WEEKS OR UNTIL HEALED   zinc gluconate 50 MG tablet Take 50 mg by mouth daily.   No facility-administered encounter medications on file as of 12/19/2022.     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) 06/16/2022   Osteomyelitis (HCC) 06/16/2022   Tenosynovitis of tibialis posterior tendon 06/15/2022   Wound infection 06/15/2022   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   Anemia 03/13/2019   Chronic venous insufficiency 03/12/2019   CKD (chronic kidney disease) stage 2, 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/2015   COPD (chronic obstructive pulmonary disease) (HCC) 12/08/2015   Chronic pain syndrome 05/26/2015   DDD (degenerative disc disease), lumbar 05/26/2015    Medication management 05/26/2015   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   Lumbago with sciatica 08/06/2014   Hyperlipidemia 08/20/2013   Hypertension    Abnormal glucose    Vitamin D deficiency    Arthritis    Vitamin B12 deficiency    Celiac disease 06/19/2012   GERD (gastroesophageal reflux disease) 04/30/2012   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 tube) procedure 04/30/2012   Hypothyroidism 04/30/2012   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     Health Maintenance Due  Topic Date Due   COVID-19 Vaccine (1) Never done   Zoster Vaccines- Shingrix (1 of 2) Never done   OPHTHALMOLOGY EXAM  07/11/2017   DTaP/Tdap/Td (2 - Tdap) 01/11/2021   Medicare Annual Wellness (AWV)  03/11/2021   FOOT EXAM  03/16/2022     Review of Systems +thrush, but otherwise 12 point ros is negative Physical Exam   BP (!) 145/67   Pulse (!) 57   Temp 98 F (36.7 C) (Temporal)   Ht 5' 6.5" (1.689 m)   Wt 170 lb (77.1 kg)   SpO2 96%   BMI 27.03 kg/m   Physical Exam  Constitutional: He is oriented to person, place, and time. He appears well-developed and well-nourished. No distress.  HENT:  Mouth/Throat: Oropharynx is clear and moist. No oropharyngeal exudate. +glossy tongue Cardiovascular: Normal rate, regular rhythm and normal heart sounds. Exam reveals no gallop and no friction rub.  No murmur heard.  Pulmonary/Chest: Effort normal and breath sounds normal. No respiratory distress. He has no wheezes.  Abdominal: Soft. Bowel sounds are normal. He exhibits no distension. There is no tenderness.  Lymphadenopathy:  He has no cervical adenopathy.  Neurological: He is alert and oriented to person, place, and time.  Skin: Skin is warm and dry. No rash noted. No erythema.  Psychiatric: He has a normal mood and affect. His behavior is normal.    CBC Lab Results  Component Value Date   WBC 14.6 (H) 11/10/2022   RBC 3.92 (L) 11/10/2022   HGB 12.5 (L) 11/10/2022   HCT 36.3 (L) 11/10/2022    PLT 371 11/10/2022   MCV 92.6 11/10/2022   MCH 31.9 11/10/2022   MCHC 34.4 11/10/2022   RDW 14.5 11/10/2022   LYMPHSABS 2,252 10/05/2022   MONOABS 0.6 06/15/2022   EOSABS 338 10/05/2022    BMET Lab Results  Component Value Date   NA 131 (L) 11/10/2022   K 3.5 11/10/2022   CL 96 (L) 11/10/2022   CO2 25 11/10/2022   GLUCOSE 108 (H) 11/10/2022   BUN 34 (H) 11/10/2022   CREATININE 1.16 11/10/2022   CALCIUM 8.9 11/10/2022   GFRNONAA >60 11/10/2022   GFRAA 75 09/28/2020      Assessment and Plan Left ankle osteomyelitis =continue on piptazo through 8/6. Then decide if need to change to oral regimen  Left ankle wound = continue with follow up with dr duda. Presently using santyl  Aki = thought to be related to antibiotic. Now renally  dosed and continue to monitor cr

## 2022-12-19 NOTE — Telephone Encounter (Signed)
Beth, RN with [name] called regarding drug interaction between fluconazole, hydroxyzine and hydrocodone-acetmainophen.   Beth can be reached directly at [phone]

## 2022-12-20 DIAGNOSIS — M869 Osteomyelitis, unspecified: Secondary | ICD-10-CM | POA: Diagnosis not present

## 2022-12-25 ENCOUNTER — Ambulatory Visit (INDEPENDENT_AMBULATORY_CARE_PROVIDER_SITE_OTHER): Payer: Medicare HMO | Admitting: Orthopedic Surgery

## 2022-12-25 DIAGNOSIS — Z792 Long term (current) use of antibiotics: Secondary | ICD-10-CM | POA: Diagnosis not present

## 2022-12-25 DIAGNOSIS — Z48817 Encounter for surgical aftercare following surgery on the skin and subcutaneous tissue: Secondary | ICD-10-CM | POA: Diagnosis not present

## 2022-12-25 DIAGNOSIS — Z5181 Encounter for therapeutic drug level monitoring: Secondary | ICD-10-CM | POA: Diagnosis not present

## 2022-12-25 DIAGNOSIS — D631 Anemia in chronic kidney disease: Secondary | ICD-10-CM | POA: Diagnosis not present

## 2022-12-25 DIAGNOSIS — E1122 Type 2 diabetes mellitus with diabetic chronic kidney disease: Secondary | ICD-10-CM | POA: Diagnosis not present

## 2022-12-25 DIAGNOSIS — L97329 Non-pressure chronic ulcer of left ankle with unspecified severity: Secondary | ICD-10-CM | POA: Diagnosis not present

## 2022-12-25 DIAGNOSIS — L97322 Non-pressure chronic ulcer of left ankle with fat layer exposed: Secondary | ICD-10-CM

## 2022-12-25 DIAGNOSIS — E11622 Type 2 diabetes mellitus with other skin ulcer: Secondary | ICD-10-CM | POA: Diagnosis not present

## 2022-12-25 DIAGNOSIS — I129 Hypertensive chronic kidney disease with stage 1 through stage 4 chronic kidney disease, or unspecified chronic kidney disease: Secondary | ICD-10-CM | POA: Diagnosis not present

## 2022-12-25 DIAGNOSIS — N182 Chronic kidney disease, stage 2 (mild): Secondary | ICD-10-CM | POA: Diagnosis not present

## 2022-12-25 DIAGNOSIS — M65172 Other infective (teno)synovitis, left ankle and foot: Secondary | ICD-10-CM | POA: Diagnosis not present

## 2022-12-25 DIAGNOSIS — B965 Pseudomonas (aeruginosa) (mallei) (pseudomallei) as the cause of diseases classified elsewhere: Secondary | ICD-10-CM | POA: Diagnosis not present

## 2022-12-26 ENCOUNTER — Encounter: Payer: Self-pay | Admitting: Orthopedic Surgery

## 2022-12-26 NOTE — Progress Notes (Signed)
Office Visit Note   Patient: [name] Fernandez           Date of Birth: 11-06-1940           MRN: [id number] Visit Date: 12/25/2022              Requested by: Lucky Cowboy, MD [address] [place],  [place] [address] PCP: Lucky Cowboy, MD  Chief Complaint  Patient presents with   Left Ankle - Routine Post Op    11/10/2022 left ankle debridement       HPI: Patient is an 82 year old gentleman who is seen in follow-up status post debridement chronic left ankle wound.  Patient is 6 weeks out from surgery.  Patient is currently following with infectious disease for IV antibiotics with a PICC line.  Assessment & Plan: Visit Diagnoses:  1. 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  Reevaluate in 2 weeks.  Patient is showing excellent decrease fibrinous tissue with the Santyl and if the wound bed continues to improve we could apply Kerecis micro graft in the office.  Continue elevation exercise and compression.  Follow-Up Instructions: Return in about 2 weeks (around 01/08/2023).   Ortho Exam  Patient is alert, oriented, no adenopathy, well-dressed, normal affect, normal respiratory effort. Examination the wound bed shows improved granulation tissue with decreased fibrinous exudate.  There is superficial epithelialization around the wound edges.  Wound measures 6.5 x 11 cm.  Imaging: No results found.   Labs: Lab Results  Component Value Date   HGBA1C 6.3 (H) 10/05/2022   HGBA1C 6.4 (H) 04/04/2022   HGBA1C 5.9 (H) 09/14/2021   ESRSEDRATE 32 (H) 06/15/2022   ESRSEDRATE 40 (H) 06/14/2022   CRP 0.8 06/15/2022   CRP <0.5 06/14/2022   LABURIC 7.8 05/20/2014   LABURIC 6.8 05/20/2013   REPTSTATUS 11/17/2022 FINAL 11/10/2022   GRAMSTAIN  11/10/2022    ABUNDANT SQUAMOUS EPITHELIAL CELLS PRESENT MODERATE WBC PRESENT, PREDOMINANTLY PMN ABUNDANT GRAM NEGATIVE RODS FEW GRAM POSITIVE  COCCI    CULT  11/10/2022    ABUNDANT PSEUDOMONAS AERUGINOSA FEW ENTEROCOCCUS FAECALIS MODERATE FINEGOLDIA MAGNA Standardized susceptibility testing for this organism is not available. Performed at [facility], [address], [place], [place] [address]    LABORGA PSEUDOMONAS AERUGINOSA 11/10/2022   LABORGA ENTEROCOCCUS FAECALIS 11/10/2022     Lab Results  Component Value Date   ALBUMIN 2.9 (L) 06/16/2022   ALBUMIN 3.8 06/14/2022   ALBUMIN 4.0 09/25/2016   PREALBUMIN 18 06/14/2022    Lab Results  Component Value Date   MG 1.8 06/19/2022   MG 1.9 04/04/2022   MG 1.9 03/16/2021   Lab Results  Component Value Date   VD25OH 57 10/05/2022   VD25OH 59 04/04/2022   VD25OH 44 09/28/2020    Lab Results  Component Value Date   PREALBUMIN 18 06/14/2022      Latest Ref Rng & Units 11/10/2022    9:56 AM 10/05/2022   12:16 PM 06/20/2022    3:24 AM  CBC EXTENDED  WBC 4.0 - 10.5 K/uL 14.6  8.9  8.4   RBC 4.22 - 5.81 MIL/uL 3.92  4.00  3.44   Hemoglobin 13.0 - 17.0 g/dL 78.4  69.6  29.5   HCT 39.0 - 52.0 % 36.3  37.4  31.8   Platelets 150 - 400 K/uL 371  369  304   NEUT# 1,500 - 7,800 cells/uL  5,509    Lymph# 850 - 3,900 cells/uL  2,252       There is no height or weight on file to calculate BMI.  Orders:  No orders of the defined types were placed in this encounter.  No orders of the defined types were placed in this encounter.    Procedures: No procedures performed  Clinical Data: No additional findings.  ROS:  All other systems negative, except as noted in the HPI. Review of Systems  Objective: Vital Signs: There were no vitals taken for this visit.  Specialty Comments:  No specialty comments available.  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) 06/16/2022   Osteomyelitis (HCC) 06/16/2022   Tenosynovitis of  tibialis posterior tendon 06/15/2022   Wound infection 06/15/2022   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   Anemia 03/13/2019   Chronic venous insufficiency 03/12/2019   CKD (chronic kidney disease) stage 2, 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/2015   COPD (chronic obstructive pulmonary disease) (HCC) 12/08/2015   Chronic pain syndrome 05/26/2015   DDD (degenerative disc disease), lumbar 05/26/2015   Medication management 05/26/2015   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   Lumbago with sciatica 08/06/2014   Hyperlipidemia 08/20/2013   Hypertension    Abnormal glucose    Vitamin D deficiency    Arthritis    Vitamin B12 deficiency    Celiac disease 06/19/2012   GERD (gastroesophageal reflux disease) 04/30/2012   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 tube) procedure 04/30/2012   Hypothyroidism 04/30/2012   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   Past Medical History:  Diagnosis Date   Anemia    Celiac disease    diagnoses 06/24/12   Chronic ulcer of ankle (HCC)    left   Esophageal stricture    GERD (gastroesophageal reflux disease)    Hiatal hernia    Hypertension    patient states " no current HTN"   Hypothyroidism    Pre-diabetes    Prediabetes    Shortness of breath    from oxycodone at times   Vitamin B12 deficiency    Vitamin D deficiency     Family History  Problem Relation Age of Onset   Esophageal cancer Father     Past Surgical History:  Procedure Laterality Date   APPENDECTOMY     BACK SURGERY     3 diff surgeries for vertebrea broken   EYE SURGERY     bilateral cataract surgery   HERNIA REPAIR  02/10/1949   bilateral groin as child   HIATAL HERNIA REPAIR     I & D EXTREMITY Left 06/16/2022   Procedure: IRRIGATION AND DEBRIDEMENT ANKLE;  Surgeon: Nadara Mustard, MD;  Location: [facility] OR;  Service: Orthopedics;  Laterality: Left;   I & D EXTREMITY Left 07/21/2022   Procedure: LEFT ANKLE DEBRIDEMENT;  Surgeon: Nadara Mustard, MD;  Location: [facility] OR;  Service:  Orthopedics;  Laterality: Left;   I & D EXTREMITY Left 11/10/2022   Procedure: LEFT ANKLE DEBRIDEMENT;  Surgeon: Nadara Mustard, MD;  Location: [facility] OR;  Service: Orthopedics;  Laterality: Left;   JOINT REPLACEMENT  04/12/2010   right knee replacement   TONSILLECTOMY     as child   TOTAL KNEE REVISION  11/20/2011   Procedure: TOTAL KNEE REVISION;  Surgeon: Shelda Pal, MD;  Location: [facility] ORS;  Service: Orthopedics;  Laterality: Right;  Right Total Knee Revision   Social History   Occupational History   Occupation: Retired    Associate Professor: [employer]  Tobacco Use   Smoking status: Former    Current packs/day: 0.00    Average packs/day: 1 pack/day for 20.0 years (20.0 ttl pk-yrs)    Types: Cigarettes    Start date: 06/27/1960    Quit date: 06/27/1980    Years since quitting: 42.5    Passive exposure: Past   Smokeless tobacco: Never  Vaping Use   Vaping status: Never Used  Substance and Sexual Activity   Alcohol use: No   Drug use: No   Sexual activity: Yes

## 2022-12-29 DIAGNOSIS — M869 Osteomyelitis, unspecified: Secondary | ICD-10-CM | POA: Diagnosis not present

## 2023-01-01 ENCOUNTER — Telehealth: Payer: Self-pay | Admitting: Orthopedic Surgery

## 2023-01-01 DIAGNOSIS — B965 Pseudomonas (aeruginosa) (mallei) (pseudomallei) as the cause of diseases classified elsewhere: Secondary | ICD-10-CM | POA: Diagnosis not present

## 2023-01-01 DIAGNOSIS — N182 Chronic kidney disease, stage 2 (mild): Secondary | ICD-10-CM | POA: Diagnosis not present

## 2023-01-01 DIAGNOSIS — D631 Anemia in chronic kidney disease: Secondary | ICD-10-CM | POA: Diagnosis not present

## 2023-01-01 DIAGNOSIS — Z5181 Encounter for therapeutic drug level monitoring: Secondary | ICD-10-CM | POA: Diagnosis not present

## 2023-01-01 DIAGNOSIS — L97329 Non-pressure chronic ulcer of left ankle with unspecified severity: Secondary | ICD-10-CM | POA: Diagnosis not present

## 2023-01-01 DIAGNOSIS — Z792 Long term (current) use of antibiotics: Secondary | ICD-10-CM | POA: Diagnosis not present

## 2023-01-01 DIAGNOSIS — M65172 Other infective (teno)synovitis, left ankle and foot: Secondary | ICD-10-CM | POA: Diagnosis not present

## 2023-01-01 DIAGNOSIS — E11622 Type 2 diabetes mellitus with other skin ulcer: Secondary | ICD-10-CM | POA: Diagnosis not present

## 2023-01-01 DIAGNOSIS — Z48817 Encounter for surgical aftercare following surgery on the skin and subcutaneous tissue: Secondary | ICD-10-CM | POA: Diagnosis not present

## 2023-01-01 DIAGNOSIS — E1122 Type 2 diabetes mellitus with diabetic chronic kidney disease: Secondary | ICD-10-CM | POA: Diagnosis not present

## 2023-01-01 DIAGNOSIS — I129 Hypertensive chronic kidney disease with stage 1 through stage 4 chronic kidney disease, or unspecified chronic kidney disease: Secondary | ICD-10-CM | POA: Diagnosis not present

## 2023-01-01 NOTE — Telephone Encounter (Signed)
They need his demo sheet. Will fax to them.

## 2023-01-01 NOTE — Telephone Encounter (Signed)
Received vm from [facility] regarding an order for wound care they received. They need more info, please call [phone], fax [phone]

## 2023-01-01 NOTE — Telephone Encounter (Signed)
Faxed to # below

## 2023-01-03 DIAGNOSIS — Z48817 Encounter for surgical aftercare following surgery on the skin and subcutaneous tissue: Secondary | ICD-10-CM | POA: Diagnosis not present

## 2023-01-03 DIAGNOSIS — I129 Hypertensive chronic kidney disease with stage 1 through stage 4 chronic kidney disease, or unspecified chronic kidney disease: Secondary | ICD-10-CM | POA: Diagnosis not present

## 2023-01-03 DIAGNOSIS — E11622 Type 2 diabetes mellitus with other skin ulcer: Secondary | ICD-10-CM | POA: Diagnosis not present

## 2023-01-03 DIAGNOSIS — B965 Pseudomonas (aeruginosa) (mallei) (pseudomallei) as the cause of diseases classified elsewhere: Secondary | ICD-10-CM | POA: Diagnosis not present

## 2023-01-03 DIAGNOSIS — M65172 Other infective (teno)synovitis, left ankle and foot: Secondary | ICD-10-CM | POA: Diagnosis not present

## 2023-01-03 DIAGNOSIS — L97329 Non-pressure chronic ulcer of left ankle with unspecified severity: Secondary | ICD-10-CM | POA: Diagnosis not present

## 2023-01-03 DIAGNOSIS — E1122 Type 2 diabetes mellitus with diabetic chronic kidney disease: Secondary | ICD-10-CM | POA: Diagnosis not present

## 2023-01-03 DIAGNOSIS — D631 Anemia in chronic kidney disease: Secondary | ICD-10-CM | POA: Diagnosis not present

## 2023-01-03 DIAGNOSIS — N182 Chronic kidney disease, stage 2 (mild): Secondary | ICD-10-CM | POA: Diagnosis not present

## 2023-01-05 ENCOUNTER — Ambulatory Visit: Payer: Medicare HMO | Admitting: Internal Medicine

## 2023-01-06 DIAGNOSIS — M869 Osteomyelitis, unspecified: Secondary | ICD-10-CM | POA: Diagnosis not present

## 2023-01-08 ENCOUNTER — Ambulatory Visit (INDEPENDENT_AMBULATORY_CARE_PROVIDER_SITE_OTHER): Payer: Medicare HMO | Admitting: Orthopedic Surgery

## 2023-01-08 ENCOUNTER — Encounter: Payer: Self-pay | Admitting: Orthopedic Surgery

## 2023-01-08 DIAGNOSIS — D631 Anemia in chronic kidney disease: Secondary | ICD-10-CM | POA: Diagnosis not present

## 2023-01-08 DIAGNOSIS — L97322 Non-pressure chronic ulcer of left ankle with fat layer exposed: Secondary | ICD-10-CM

## 2023-01-08 DIAGNOSIS — B965 Pseudomonas (aeruginosa) (mallei) (pseudomallei) as the cause of diseases classified elsewhere: Secondary | ICD-10-CM | POA: Diagnosis not present

## 2023-01-08 DIAGNOSIS — M65172 Other infective (teno)synovitis, left ankle and foot: Secondary | ICD-10-CM | POA: Diagnosis not present

## 2023-01-08 DIAGNOSIS — E11622 Type 2 diabetes mellitus with other skin ulcer: Secondary | ICD-10-CM | POA: Diagnosis not present

## 2023-01-08 DIAGNOSIS — N182 Chronic kidney disease, stage 2 (mild): Secondary | ICD-10-CM | POA: Diagnosis not present

## 2023-01-08 DIAGNOSIS — Z48817 Encounter for surgical aftercare following surgery on the skin and subcutaneous tissue: Secondary | ICD-10-CM | POA: Diagnosis not present

## 2023-01-08 DIAGNOSIS — L97329 Non-pressure chronic ulcer of left ankle with unspecified severity: Secondary | ICD-10-CM | POA: Diagnosis not present

## 2023-01-08 DIAGNOSIS — E1122 Type 2 diabetes mellitus with diabetic chronic kidney disease: Secondary | ICD-10-CM | POA: Diagnosis not present

## 2023-01-08 DIAGNOSIS — I129 Hypertensive chronic kidney disease with stage 1 through stage 4 chronic kidney disease, or unspecified chronic kidney disease: Secondary | ICD-10-CM | POA: Diagnosis not present

## 2023-01-08 NOTE — Progress Notes (Signed)
Office Visit Note   Patient: [name] Fernandez           Date of Birth: 06/29/1940           MRN: [id number] Visit Date: 01/08/2023              Requested by: Lucky Cowboy, MD [address] [place],  [place] [address] PCP: Lucky Cowboy, MD  Chief Complaint  Patient presents with   Left Ankle - Routine Post Op    11/10/2022 left ankle debridement       HPI: Patient is an 82 year old gentleman status post debridement chronic ulcer lateral left ankle.  Patient states he has been having increased pain cannot touch the wound or debride the wound.  Assessment & Plan: Visit Diagnoses:  1. 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  Plan for observation postoperatively to ensure wound VAC function.  Patient is currently on IV antibiotics through a PICC line.  Follow-Up Instructions: Return in about 2 weeks (around 01/22/2023).   Ortho Exam  Patient is alert, oriented, no adenopathy, well-dressed, normal affect, normal respiratory effort. Examination the wound measures 11 x 7 cm approximately 75% granulation tissue patient has too much pain with attempted debridement of the fibrinous exudative tissue.  There is no ascending cellulitis there is no purulent drainage no exposed bone or tendon.  Imaging: No results found. No images are attached to the encounter.  Labs: Lab Results  Component Value Date   HGBA1C 6.3 (H) 10/05/2022   HGBA1C 6.4 (H) 04/04/2022   HGBA1C 5.9 (H) 09/14/2021   ESRSEDRATE 32 (H) 06/15/2022   ESRSEDRATE 40 (H) 06/14/2022   CRP 0.8 06/15/2022   CRP <0.5 06/14/2022   LABURIC 7.8 05/20/2014   LABURIC 6.8 05/20/2013   REPTSTATUS 11/17/2022 FINAL 11/10/2022   GRAMSTAIN  11/10/2022    ABUNDANT SQUAMOUS EPITHELIAL CELLS PRESENT MODERATE WBC PRESENT, PREDOMINANTLY PMN ABUNDANT GRAM NEGATIVE RODS FEW GRAM POSITIVE COCCI    CULT  11/10/2022    ABUNDANT PSEUDOMONAS  AERUGINOSA FEW ENTEROCOCCUS FAECALIS MODERATE FINEGOLDIA MAGNA Standardized susceptibility testing for this organism is not available. Performed at [facility], [address], [place], [place] [address]    LABORGA PSEUDOMONAS AERUGINOSA 11/10/2022   LABORGA ENTEROCOCCUS FAECALIS 11/10/2022     Lab Results  Component Value Date   ALBUMIN 2.9 (L) 06/16/2022   ALBUMIN 3.8 06/14/2022   ALBUMIN 4.0 09/25/2016   PREALBUMIN 18 06/14/2022    Lab Results  Component Value Date   MG 1.8 06/19/2022   MG 1.9 04/04/2022   MG 1.9 03/16/2021   Lab Results  Component Value Date   VD25OH 57 10/05/2022   VD25OH 59 04/04/2022   VD25OH 44 09/28/2020    Lab Results  Component Value Date   PREALBUMIN 18 06/14/2022      Latest Ref Rng & Units 11/10/2022    9:56 AM 10/05/2022   12:16 PM 06/20/2022    3:24 AM  CBC EXTENDED  WBC 4.0 - 10.5 K/uL 14.6  8.9  8.4   RBC 4.22 - 5.81 MIL/uL 3.92  4.00  3.44   Hemoglobin 13.0 - 17.0 g/dL 40.1  02.7  25.3   HCT 39.0 - 52.0 % 36.3  37.4  31.8   Platelets 150 - 400 K/uL 371  369  304   NEUT# 1,500 - 7,800 cells/uL  5,509    Lymph# 850 -  3,900 cells/uL  2,252       There is no height or weight on file to calculate BMI.  Orders:  No orders of the defined types were placed in this encounter.  No orders of the defined types were placed in this encounter.    Procedures: No procedures performed  Clinical Data: No additional findings.  ROS:  All other systems negative, except as noted in the HPI. Review of Systems  Objective: Vital Signs: There were no vitals taken for this visit.  Specialty Comments:  No specialty comments available.  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) 06/16/2022   Osteomyelitis (HCC) 06/16/2022   Tenosynovitis of tibialis posterior tendon 06/15/2022   Wound infection  06/15/2022   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   Anemia 03/13/2019   Chronic venous insufficiency 03/12/2019   CKD (chronic kidney disease) stage 2, 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/2015   COPD (chronic obstructive pulmonary disease) (HCC) 12/08/2015   Chronic pain syndrome 05/26/2015   DDD (degenerative disc disease), lumbar 05/26/2015   Medication management 05/26/2015   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   Lumbago with sciatica 08/06/2014   Hyperlipidemia 08/20/2013   Hypertension    Abnormal glucose    Vitamin D deficiency    Arthritis    Vitamin B12 deficiency    Celiac disease 06/19/2012   GERD (gastroesophageal reflux disease) 04/30/2012   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 tube) procedure 04/30/2012   Hypothyroidism 04/30/2012   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   Past Medical History:  Diagnosis Date   Anemia    Celiac disease    diagnoses 06/24/12   Chronic ulcer of ankle (HCC)    left   Esophageal stricture    GERD (gastroesophageal reflux disease)    Hiatal hernia    Hypertension    patient states " no current HTN"   Hypothyroidism    Pre-diabetes    Prediabetes    Shortness of breath    from oxycodone at times   Vitamin B12 deficiency    Vitamin D deficiency     Family History  Problem Relation Age of Onset   Esophageal cancer Father     Past Surgical History:  Procedure Laterality Date   APPENDECTOMY     BACK SURGERY     3 diff surgeries for vertebrea broken   EYE SURGERY     bilateral cataract surgery   HERNIA REPAIR  02/10/1949   bilateral groin as child   HIATAL HERNIA REPAIR     I & D EXTREMITY Left 06/16/2022   Procedure: IRRIGATION AND DEBRIDEMENT ANKLE;  Surgeon: Nadara Mustard, MD;  Location: [facility] OR;  Service: Orthopedics;  Laterality: Left;   I & D EXTREMITY Left 07/21/2022   Procedure: LEFT ANKLE DEBRIDEMENT;  Surgeon: Nadara Mustard, MD;  Location: [facility] OR;  Service: Orthopedics;  Laterality: Left;   I & D EXTREMITY Left  11/10/2022   Procedure: LEFT ANKLE DEBRIDEMENT;  Surgeon: Nadara Mustard, MD;  Location: [facility] OR;  Service: Orthopedics;  Laterality: Left;   JOINT REPLACEMENT  04/12/2010   right knee replacement   TONSILLECTOMY     as child   TOTAL KNEE REVISION  11/20/2011   Procedure: TOTAL KNEE REVISION;  Surgeon: Shelda Pal, MD;  Location: [facility] ORS;  Service: Orthopedics;  Laterality: Right;  Right Total Knee Revision  Social History   Occupational History   Occupation: Retired    Associate Professor: [employer]  Tobacco Use   Smoking status: Former    Current packs/day: 0.00    Average packs/day: 1 pack/day for 20.0 years (20.0 ttl pk-yrs)    Types: Cigarettes    Start date: 06/27/1960    Quit date: 06/27/1980    Years since quitting: 42.5    Passive exposure: Past   Smokeless tobacco: Never  Vaping Use   Vaping status: Never Used  Substance and Sexual Activity   Alcohol use: No   Drug use: No   Sexual activity: Yes

## 2023-01-10 ENCOUNTER — Telehealth: Payer: Self-pay

## 2023-01-10 NOTE — Telephone Encounter (Signed)
Patient's wife called office to discuss antibiotics and picc line. States that Mr. Mayoral is scheduled to go into hospital on 8/2 for LEFT ANKLE DEBRIDEMENT and would like to know if hospital staff can pull picc while in hospital. Is scheduled to end antibiotics on 8/6.  Would like to know if patient can end antibiotics early to avoid having additional supplies sent out.  Per Dr. Drue Second okay to end antibiotics early and staff at hospital can remove picc line.  Juanita Laster, RMA

## 2023-01-10 NOTE — Telephone Encounter (Signed)
Per Dr. Drue Second okay to end Iv antibiotics 8/2. Juanita Laster, RMA

## 2023-01-11 ENCOUNTER — Encounter (HOSPITAL_COMMUNITY): Payer: Self-pay | Admitting: Orthopedic Surgery

## 2023-01-11 ENCOUNTER — Other Ambulatory Visit: Payer: Self-pay

## 2023-01-11 NOTE — Progress Notes (Addendum)
PCP - Lucky Cowboy Cardiologist - denies  PPM/ICD - denies  EKG - 04/05/22  CPAP - n/a  Fasting Blood Sugar - patient denied Diabetes  Blood Thinner Instructions: n/a Patient was instructed: As of today, STOP taking any Aspirin (unless otherwise instructed by your surgeon) Aleve, Naproxen, Ibuprofen, Motrin, Advil, Goody's, BC's, all herbal medications, fish oil, and all vitamins.  ERAS Protcol - yes, until 13:20 o'clock  COVID TEST- n/a  Anesthesia review: no  Patient verbally denies any shortness of breath, fever, cough and chest pain during phone call   -------------  SDW INSTRUCTIONS given:  Your procedure is scheduled on Friday, August 2nd, 2024.  Report to [facility] Main Entrance "A" at 09:00 A.M., and check in at the Admitting office.  Call this number if you have problems the morning of surgery:  [phone]   Remember:  Do not eat after midnight the night before your surgery  You may drink clear liquids until 08:30 the morning of your surgery.   Clear liquids allowed are: Water, Non-Citrus Juices (without pulp), Carbonated Beverages, Clear Tea, Black Coffee Only, and Gatorade    Take these medicines the morning of surgery with A SIP OF WATER:  Prilosec, Zosyn (PICC line) PRN: Tylenol, Gabapentin, Hydroxyzine   The day of surgery:                     Do not wear jewelry,             Do not wear lotions, powders, colognes, or deodorant.            Men may shave face and neck.            Do not bring valuables to the hospital.            [facility] is not responsible for any belongings or valuables.  Do NOT Smoke (Tobacco/Vaping) 24 hours prior to your procedure If you use a CPAP at night, you may bring all equipment for your overnight stay.   Contacts, glasses, dentures or bridgework may not be worn into surgery.      For patients admitted to the hospital, discharge time will be determined by your treatment team.   Patients discharged the day of  surgery will not be allowed to drive home, and someone needs to stay with them for 24 hours.    Special instructions:   - Preparing For Surgery  Before surgery, you can play an important role. Because skin is not sterile, your skin needs to be as free of germs as possible. You can reduce the number of germs on your skin by washing with CHG (chlorahexidine gluconate) Soap before surgery.  CHG is an antiseptic cleaner which kills germs and bonds with the skin to continue killing germs even after washing.    Oral Hygiene is also important to reduce your risk of infection.  Remember - BRUSH YOUR TEETH THE MORNING OF SURGERY WITH YOUR REGULAR TOOTHPASTE  Please do not use if you have an allergy to CHG or antibacterial soaps. If your skin becomes reddened/irritated stop using the CHG.  Do not shave (including legs and underarms) for at least 48 hours prior to first CHG shower. It is OK to shave your face.  Please follow these instructions carefully.   Shower the NIGHT BEFORE SURGERY and the MORNING OF SURGERY with DIAL Soap.   Pat yourself dry with a CLEAN TOWEL.  Wear CLEAN PAJAMAS to bed the night before  surgery  Place CLEAN SHEETS on your bed the night of your first shower and DO NOT SLEEP WITH PETS.   Day of Surgery: Please shower morning of surgery  Wear Clean/Comfortable clothing the morning of surgery Do not apply any deodorants/lotions.   Remember to brush your teeth WITH YOUR REGULAR TOOTHPASTE.   Questions were answered. Patient verbalized understanding of instructions.

## 2023-01-12 ENCOUNTER — Other Ambulatory Visit: Payer: Self-pay

## 2023-01-12 ENCOUNTER — Encounter (HOSPITAL_COMMUNITY)
Admission: AD | Disposition: A | Discharge status: Home Health | Payer: Self-pay | Source: Home / Self Care | Attending: Orthopedic Surgery

## 2023-01-12 ENCOUNTER — Encounter (HOSPITAL_COMMUNITY): Payer: Self-pay | Admitting: Orthopedic Surgery

## 2023-01-12 ENCOUNTER — Ambulatory Visit (HOSPITAL_BASED_OUTPATIENT_CLINIC_OR_DEPARTMENT_OTHER): Payer: Medicare HMO | Admitting: Anesthesiology

## 2023-01-12 ENCOUNTER — Inpatient Hospital Stay (HOSPITAL_COMMUNITY)
Admission: AD | Admit: 2023-01-12 | Discharge: 2023-01-16 | DRG: 575 | Disposition: A | Discharge status: Home Health | Payer: Medicare HMO | Attending: Orthopedic Surgery | Admitting: Orthopedic Surgery

## 2023-01-12 ENCOUNTER — Ambulatory Visit (HOSPITAL_COMMUNITY): Payer: Medicare HMO | Admitting: Anesthesiology

## 2023-01-12 DIAGNOSIS — Z882 Allergy status to sulfonamides status: Secondary | ICD-10-CM

## 2023-01-12 DIAGNOSIS — S91002A Unspecified open wound, left ankle, initial encounter: Principal | ICD-10-CM

## 2023-01-12 DIAGNOSIS — S92002D Unspecified fracture of left calcaneus, subsequent encounter for fracture with routine healing: Secondary | ICD-10-CM

## 2023-01-12 DIAGNOSIS — L97323 Non-pressure chronic ulcer of left ankle with necrosis of muscle: Principal | ICD-10-CM | POA: Diagnosis present

## 2023-01-12 DIAGNOSIS — Z959 Presence of cardiac and vascular implant and graft, unspecified: Secondary | ICD-10-CM

## 2023-01-12 DIAGNOSIS — Z800 Family history of malignant neoplasm of digestive organs: Secondary | ICD-10-CM

## 2023-01-12 DIAGNOSIS — M869 Osteomyelitis, unspecified: Secondary | ICD-10-CM

## 2023-01-12 DIAGNOSIS — I10 Essential (primary) hypertension: Secondary | ICD-10-CM | POA: Diagnosis present

## 2023-01-12 DIAGNOSIS — Z888 Allergy status to other drugs, medicaments and biological substances status: Secondary | ICD-10-CM

## 2023-01-12 DIAGNOSIS — Z881 Allergy status to other antibiotic agents status: Secondary | ICD-10-CM

## 2023-01-12 DIAGNOSIS — K900 Celiac disease: Secondary | ICD-10-CM | POA: Diagnosis present

## 2023-01-12 DIAGNOSIS — Z96651 Presence of right artificial knee joint: Secondary | ICD-10-CM | POA: Diagnosis present

## 2023-01-12 DIAGNOSIS — K219 Gastro-esophageal reflux disease without esophagitis: Secondary | ICD-10-CM | POA: Diagnosis present

## 2023-01-12 DIAGNOSIS — E039 Hypothyroidism, unspecified: Secondary | ICD-10-CM | POA: Diagnosis present

## 2023-01-12 DIAGNOSIS — Z87891 Personal history of nicotine dependence: Secondary | ICD-10-CM

## 2023-01-12 DIAGNOSIS — Z91040 Latex allergy status: Secondary | ICD-10-CM

## 2023-01-12 DIAGNOSIS — R7303 Prediabetes: Secondary | ICD-10-CM | POA: Diagnosis present

## 2023-01-12 DIAGNOSIS — L97329 Non-pressure chronic ulcer of left ankle with unspecified severity: Secondary | ICD-10-CM | POA: Diagnosis not present

## 2023-01-12 HISTORY — PX: APPLICATION OF WOUND VAC: SHX5189

## 2023-01-12 HISTORY — PX: I & D EXTREMITY: SHX5045

## 2023-01-12 LAB — CBC
HCT: 33.5 % — ABNORMAL LOW (ref 39.0–52.0)
Hemoglobin: 11.4 g/dL — ABNORMAL LOW (ref 13.0–17.0)
MCH: 31.8 pg (ref 26.0–34.0)
MCHC: 34 g/dL (ref 30.0–36.0)
MCV: 93.3 fL (ref 80.0–100.0)
Platelets: 356 10*3/uL (ref 150–400)
RBC: 3.59 MIL/uL — ABNORMAL LOW (ref 4.22–5.81)
RDW: 14.4 % (ref 11.5–15.5)
WBC: 8.1 10*3/uL (ref 4.0–10.5)
nRBC: 0 % (ref 0.0–0.2)

## 2023-01-12 LAB — BASIC METABOLIC PANEL
Anion gap: 11 (ref 5–15)
BUN: 14 mg/dL (ref 8–23)
CO2: 25 mmol/L (ref 22–32)
Calcium: 9.1 mg/dL (ref 8.9–10.3)
Chloride: 103 mmol/L (ref 98–111)
Creatinine, Ser: 1.22 mg/dL (ref 0.61–1.24)
GFR, Estimated: 59 mL/min — ABNORMAL LOW (ref >60)
Glucose, Bld: 98 mg/dL (ref 70–99)
Potassium: 3.4 mmol/L — ABNORMAL LOW (ref 3.5–5.1)
Sodium: 139 mmol/L (ref 135–145)

## 2023-01-12 LAB — SURGICAL PCR SCREEN
MRSA, PCR: NEGATIVE
Staphylococcus aureus: NEGATIVE

## 2023-01-12 LAB — AEROBIC/ANAEROBIC CULTURE W GRAM STAIN (SURGICAL/DEEP WOUND): Gram Stain: NONE SEEN

## 2023-01-12 SURGERY — IRRIGATION AND DEBRIDEMENT EXTREMITY
Anesthesia: General | Site: Ankle | Laterality: Left

## 2023-01-12 MED ORDER — ONDANSETRON HCL 4 MG/2ML IJ SOLN
INTRAMUSCULAR | Status: DC | PRN
  Administered 2023-01-12: 4 mg via INTRAVENOUS

## 2023-01-12 MED ORDER — ACETAMINOPHEN 325 MG PO TABS
650.0000 mg | ORAL_TABLET | Freq: Once | ORAL | Status: AC
  Administered 2023-01-12: 650 mg via ORAL
  Filled 2023-01-12: qty 2

## 2023-01-12 MED ORDER — HYDROXYZINE HCL 25 MG PO TABS: 50.0000 mg | ORAL_TABLET | Freq: Every day | ORAL | Status: DC

## 2023-01-12 MED ORDER — GABAPENTIN 600 MG PO TABS: 300.0000 mg | ORAL_TABLET | Freq: Three times a day (TID) | ORAL | Status: DC

## 2023-01-12 MED ORDER — FENTANYL CITRATE (PF) 250 MCG/5ML IJ SOLN
INTRAMUSCULAR | Status: AC
  Filled 2023-01-12: qty 5

## 2023-01-12 MED ORDER — OXYCODONE HCL 5 MG PO TABS: 5.0000 mg | ORAL_TABLET | Freq: Once | ORAL | Status: AC | PRN

## 2023-01-12 MED ORDER — OXYCODONE HCL 5 MG PO TABS
5.0000 mg | ORAL_TABLET | ORAL | Status: DC | PRN
  Administered 2023-01-15 (×3): 10 mg via ORAL
  Filled 2023-01-12 (×8): qty 2

## 2023-01-12 MED ORDER — METHOCARBAMOL 1000 MG/10ML IJ SOLN: 500.0000 mg | Freq: Four times a day (QID) | INTRAVENOUS | Status: DC | PRN

## 2023-01-12 MED ORDER — MAGNESIUM CITRATE PO SOLN: 1.0000 | Freq: Once | ORAL | Status: DC | PRN

## 2023-01-12 MED ORDER — OXYCODONE HCL 5 MG/5ML PO SOLN
5.0000 mg | Freq: Once | ORAL | Status: AC | PRN
  Administered 2023-01-12: 5 mg via ORAL

## 2023-01-12 MED ORDER — 0.9 % SODIUM CHLORIDE (POUR BTL) OPTIME
TOPICAL | Status: DC | PRN
  Administered 2023-01-12: 1000 mL

## 2023-01-12 MED ORDER — GABAPENTIN 300 MG PO CAPS: 300.0000 mg | ORAL_CAPSULE | Freq: Three times a day (TID) | ORAL | Status: DC | PRN

## 2023-01-12 MED ORDER — ACETAMINOPHEN 10 MG/ML IV SOLN
INTRAVENOUS | Status: AC
  Filled 2023-01-12: qty 100

## 2023-01-12 MED ORDER — OXYCODONE HCL 5 MG PO TABS
ORAL_TABLET | ORAL | Status: AC
  Filled 2023-01-12: qty 3

## 2023-01-12 MED ORDER — OXYCODONE HCL 5 MG PO TABS
10.0000 mg | ORAL_TABLET | ORAL | Status: DC | PRN
  Administered 2023-01-12 (×3): 15 mg via ORAL
  Administered 2023-01-13: 10 mg via ORAL
  Administered 2023-01-13: 15 mg via ORAL
  Administered 2023-01-14 – 2023-01-16 (×5): 10 mg via ORAL
  Filled 2023-01-12: qty 2
  Filled 2023-01-12 (×3): qty 3

## 2023-01-12 MED ORDER — PANTOPRAZOLE SODIUM 40 MG PO TBEC
40.0000 mg | DELAYED_RELEASE_TABLET | Freq: Every day | ORAL | Status: DC
  Administered 2023-01-13 – 2023-01-16 (×4): 40 mg via ORAL
  Filled 2023-01-12 (×4): qty 1

## 2023-01-12 MED ORDER — ORAL CARE MOUTH RINSE: 15.0000 mL | Freq: Once | OROMUCOSAL | Status: AC

## 2023-01-12 MED ORDER — DOCUSATE SODIUM 100 MG PO CAPS
100.0000 mg | ORAL_CAPSULE | Freq: Two times a day (BID) | ORAL | Status: DC
  Administered 2023-01-12 – 2023-01-16 (×7): 100 mg via ORAL
  Filled 2023-01-12 (×8): qty 1

## 2023-01-12 MED ORDER — HYDROXYZINE HCL 25 MG PO TABS
50.0000 mg | ORAL_TABLET | Freq: Three times a day (TID) | ORAL | Status: DC | PRN
  Administered 2023-01-12 (×2): 50 mg via ORAL
  Filled 2023-01-12 (×2): qty 2

## 2023-01-12 MED ORDER — FENTANYL CITRATE (PF) 100 MCG/2ML IJ SOLN
INTRAMUSCULAR | Status: AC
  Filled 2023-01-12: qty 2

## 2023-01-12 MED ORDER — METHOCARBAMOL 500 MG PO TABS
500.0000 mg | ORAL_TABLET | Freq: Four times a day (QID) | ORAL | Status: DC | PRN
  Administered 2023-01-15 – 2023-01-16 (×2): 500 mg via ORAL
  Filled 2023-01-12 (×2): qty 1

## 2023-01-12 MED ORDER — OXYCODONE HCL 5 MG/5ML PO SOLN
ORAL | Status: AC
  Filled 2023-01-12: qty 5

## 2023-01-12 MED ORDER — POLYETHYLENE GLYCOL 3350 17 G PO PACK: 17.0000 g | PACK | Freq: Every day | ORAL | Status: DC | PRN

## 2023-01-12 MED ORDER — MIDAZOLAM HCL 2 MG/2ML IJ SOLN
INTRAMUSCULAR | Status: AC
  Filled 2023-01-12: qty 2

## 2023-01-12 MED ORDER — CHLORHEXIDINE GLUCONATE 0.12 % MT SOLN
15.0000 mL | Freq: Once | OROMUCOSAL | Status: AC
  Administered 2023-01-12: 15 mL via OROMUCOSAL
  Filled 2023-01-12: qty 15

## 2023-01-12 MED ORDER — OMEPRAZOLE MAGNESIUM 20 MG PO TBEC: 20.0000 mg | DELAYED_RELEASE_TABLET | Freq: Every day | ORAL | Status: DC

## 2023-01-12 MED ORDER — CEFAZOLIN SODIUM-DEXTROSE 2-4 GM/100ML-% IV SOLN
2.0000 g | INTRAVENOUS | Status: AC
  Administered 2023-01-12: 2 g via INTRAVENOUS
  Filled 2023-01-12: qty 100

## 2023-01-12 MED ORDER — HYDROMORPHONE HCL 1 MG/ML IJ SOLN
0.5000 mg | INTRAMUSCULAR | Status: DC | PRN
  Administered 2023-01-12: 1 mg via INTRAVENOUS

## 2023-01-12 MED ORDER — ACETAMINOPHEN 325 MG PO TABS: 325.0000 mg | ORAL_TABLET | Freq: Four times a day (QID) | ORAL | Status: DC | PRN

## 2023-01-12 MED ORDER — PIPERACILLIN SOD-TAZOBACTAM SO 3.375 (3-0.375) G IV SOLR: 3.3750 g | Freq: Three times a day (TID) | INTRAVENOUS | Status: DC

## 2023-01-12 MED ORDER — FENTANYL CITRATE (PF) 100 MCG/2ML IJ SOLN
25.0000 ug | INTRAMUSCULAR | Status: DC | PRN
  Administered 2023-01-12 (×3): 50 ug via INTRAVENOUS

## 2023-01-12 MED ORDER — PROPOFOL 10 MG/ML IV BOLUS
INTRAVENOUS | Status: DC | PRN
  Administered 2023-01-12: 200 mg via INTRAVENOUS

## 2023-01-12 MED ORDER — SODIUM CHLORIDE 0.9 % IV SOLN: INTRAVENOUS | Status: DC

## 2023-01-12 MED ORDER — PIPERACILLIN-TAZOBACTAM 3.375 G IVPB
3.3750 g | Freq: Three times a day (TID) | INTRAVENOUS | Status: DC
  Administered 2023-01-12 – 2023-01-16 (×13): 3.375 g via INTRAVENOUS
  Filled 2023-01-12 (×13): qty 50

## 2023-01-12 MED ORDER — FENTANYL CITRATE (PF) 250 MCG/5ML IJ SOLN
INTRAMUSCULAR | Status: DC | PRN
  Administered 2023-01-12: 100 ug via INTRAVENOUS
  Administered 2023-01-12: 50 ug via INTRAVENOUS

## 2023-01-12 MED ORDER — BISACODYL 10 MG RE SUPP: 10.0000 mg | Freq: Every day | RECTAL | Status: DC | PRN

## 2023-01-12 MED ORDER — ORAL CARE MOUTH RINSE: 15.0000 mL | OROMUCOSAL | Status: DC | PRN

## 2023-01-12 MED ORDER — VASHE WOUND IRRIGATION OPTIME
TOPICAL | Status: DC | PRN
  Administered 2023-01-12: 34 [oz_av]

## 2023-01-12 MED ORDER — METOCLOPRAMIDE HCL 5 MG PO TABS: 5.0000 mg | ORAL_TABLET | Freq: Three times a day (TID) | ORAL | Status: DC | PRN

## 2023-01-12 MED ORDER — LACTATED RINGERS IV SOLN: INTRAVENOUS | Status: DC

## 2023-01-12 MED ORDER — ONDANSETRON HCL 4 MG/2ML IJ SOLN: 4.0000 mg | Freq: Once | INTRAMUSCULAR | Status: DC | PRN

## 2023-01-12 MED ORDER — DEXAMETHASONE SODIUM PHOSPHATE 10 MG/ML IJ SOLN
INTRAMUSCULAR | Status: DC | PRN
  Administered 2023-01-12: 10 mg via INTRAVENOUS

## 2023-01-12 MED ORDER — MELATONIN 3 MG PO TABS
3.0000 mg | ORAL_TABLET | Freq: Every day | ORAL | Status: DC
  Administered 2023-01-12 – 2023-01-15 (×4): 3 mg via ORAL
  Filled 2023-01-12 (×4): qty 1

## 2023-01-12 MED ORDER — HYDROMORPHONE HCL 1 MG/ML IJ SOLN
INTRAMUSCULAR | Status: AC
  Filled 2023-01-12: qty 1

## 2023-01-12 MED ORDER — ONDANSETRON HCL 4 MG/2ML IJ SOLN: 4.0000 mg | Freq: Four times a day (QID) | INTRAMUSCULAR | Status: DC | PRN

## 2023-01-12 MED ORDER — ONDANSETRON HCL 4 MG PO TABS: 4.0000 mg | ORAL_TABLET | Freq: Four times a day (QID) | ORAL | Status: DC | PRN

## 2023-01-12 MED ORDER — TORSEMIDE 20 MG PO TABS
20.0000 mg | ORAL_TABLET | Freq: Every morning | ORAL | Status: DC
  Administered 2023-01-13 – 2023-01-16 (×2): 20 mg via ORAL
  Filled 2023-01-12 (×4): qty 1

## 2023-01-12 MED ORDER — LIDOCAINE 2% (20 MG/ML) 5 ML SYRINGE
INTRAMUSCULAR | Status: DC | PRN
  Administered 2023-01-12: 60 mg via INTRAVENOUS

## 2023-01-12 MED ORDER — METOCLOPRAMIDE HCL 5 MG/ML IJ SOLN: 5.0000 mg | Freq: Three times a day (TID) | INTRAMUSCULAR | Status: DC | PRN

## 2023-01-12 MED ORDER — ACETAMINOPHEN 10 MG/ML IV SOLN
1000.0000 mg | Freq: Once | INTRAVENOUS | Status: DC | PRN
  Administered 2023-01-12: 1000 mg via INTRAVENOUS

## 2023-01-12 SURGICAL SUPPLY — 39 items
BAG COUNTER SPONGE SURGICOUNT (BAG) IMPLANT
BAG SPNG CNTER NS LX DISP (BAG)
BLADE SURG 21 STRL SS (BLADE) ×1 IMPLANT
BNDG CMPR 5X6 CHSV STRCH STRL (GAUZE/BANDAGES/DRESSINGS)
BNDG COHESIVE 1X5 TAN STRL LF (GAUZE/BANDAGES/DRESSINGS) IMPLANT
BNDG COHESIVE 6X5 TAN ST LF (GAUZE/BANDAGES/DRESSINGS) IMPLANT
BNDG GAUZE DERMACEA FLUFF 4 (GAUZE/BANDAGES/DRESSINGS) ×2 IMPLANT
BNDG GZE DERMACEA 4 6PLY (GAUZE/BANDAGES/DRESSINGS)
COVER SURGICAL LIGHT HANDLE (MISCELLANEOUS) ×2 IMPLANT
DRAPE DERMATAC (DRAPES) IMPLANT
DRAPE U-SHAPE 47X51 STRL (DRAPES) ×1 IMPLANT
DRESSING VERAFLO CLEANS CC MED (GAUZE/BANDAGES/DRESSINGS) IMPLANT
DRSG ADAPTIC 3X8 NADH LF (GAUZE/BANDAGES/DRESSINGS) ×1 IMPLANT
DRSG VERAFLO CLEANSE CC MED (GAUZE/BANDAGES/DRESSINGS) ×1
DURAPREP 26ML APPLICATOR (WOUND CARE) ×1 IMPLANT
ELECT REM PT RETURN 9FT ADLT (ELECTROSURGICAL)
ELECTRODE REM PT RTRN 9FT ADLT (ELECTROSURGICAL) IMPLANT
GAUZE SPONGE 4X4 12PLY STRL (GAUZE/BANDAGES/DRESSINGS) ×1 IMPLANT
GLOVE BIOGEL PI IND STRL 9 (GLOVE) ×1 IMPLANT
GLOVE SURG ORTHO 9.0 STRL STRW (GLOVE) ×1 IMPLANT
GOWN STRL REUS W/ TWL XL LVL3 (GOWN DISPOSABLE) ×2 IMPLANT
GOWN STRL REUS W/TWL XL LVL3 (GOWN DISPOSABLE) ×2
GRAFT SKIN WND SURGICLOSE M95 (Tissue) IMPLANT
HANDPIECE INTERPULSE COAX TIP (DISPOSABLE)
KIT BASIN OR (CUSTOM PROCEDURE TRAY) ×1 IMPLANT
KIT TURNOVER KIT B (KITS) ×1 IMPLANT
MANIFOLD NEPTUNE II (INSTRUMENTS) ×1 IMPLANT
NS IRRIG 1000ML POUR BTL (IV SOLUTION) ×1 IMPLANT
PACK ORTHO EXTREMITY (CUSTOM PROCEDURE TRAY) ×1 IMPLANT
PAD ARMBOARD 7.5X6 YLW CONV (MISCELLANEOUS) ×2 IMPLANT
PAD NEG PRESSURE SENSATRAC (MISCELLANEOUS) IMPLANT
SET HNDPC FAN SPRY TIP SCT (DISPOSABLE) IMPLANT
STOCKINETTE IMPERVIOUS 9X36 MD (GAUZE/BANDAGES/DRESSINGS) IMPLANT
SUT ETHILON 2 0 PSLX (SUTURE) ×1 IMPLANT
SWAB COLLECTION DEVICE MRSA (MISCELLANEOUS) ×1 IMPLANT
SWAB CULTURE ESWAB REG 1ML (MISCELLANEOUS) IMPLANT
TOWEL GREEN STERILE (TOWEL DISPOSABLE) ×1 IMPLANT
TUBE CONNECTING 12X1/4 (SUCTIONS) ×1 IMPLANT
YANKAUER SUCT BULB TIP NO VENT (SUCTIONS) ×1 IMPLANT

## 2023-01-12 NOTE — H&P (Signed)
[name] Fernandez is an 82 y.o. male.   Chief Complaint: Chronic painful wound lateral left ankle. HPI: Patient is an 82 year old gentleman status post debridement chronic ulcer lateral left ankle. Patient states he has been having increased pain cannot touch the wound or debride the wound.   Past Medical History:  Diagnosis Date   Anemia    Celiac disease    diagnoses 06/24/12   Chronic ulcer of ankle (HCC)    left   Esophageal stricture    GERD (gastroesophageal reflux disease)    Hiatal hernia    Hypertension    patient states " no current HTN"   Hypothyroidism    Pre-diabetes    Prediabetes    Shortness of breath    from oxycodone at times   Vitamin B12 deficiency    Vitamin D deficiency     Past Surgical History:  Procedure Laterality Date   APPENDECTOMY     BACK SURGERY     3 diff surgeries for vertebrea broken   EYE SURGERY     bilateral cataract surgery   HERNIA REPAIR  02/10/1949   bilateral groin as child   HIATAL HERNIA REPAIR     I & D EXTREMITY Left 06/16/2022   Procedure: IRRIGATION AND DEBRIDEMENT ANKLE;  Surgeon: Nadara Mustard, MD;  Location: [facility] OR;  Service: Orthopedics;  Laterality: Left;   I & D EXTREMITY Left 07/21/2022   Procedure: LEFT ANKLE DEBRIDEMENT;  Surgeon: Nadara Mustard, MD;  Location: [facility] OR;  Service: Orthopedics;  Laterality: Left;   I & D EXTREMITY Left 11/10/2022   Procedure: LEFT ANKLE DEBRIDEMENT;  Surgeon: Nadara Mustard, MD;  Location: [facility] OR;  Service: Orthopedics;  Laterality: Left;   JOINT REPLACEMENT  04/12/2010   right knee replacement   TONSILLECTOMY     as child   TOTAL KNEE REVISION  11/20/2011   Procedure: TOTAL KNEE REVISION;  Surgeon: Shelda Pal, MD;  Location: [facility] ORS;  Service: Orthopedics;  Laterality: Right;  Right Total Knee Revision    Family History  Problem Relation Age of Onset   Esophageal cancer Father    Social History:  reports that he quit smoking about 42 years ago. His smoking use included cigarettes. He  started smoking about 62 years ago. He has a 20 pack-year smoking history. He has been exposed to tobacco smoke. He has never used smokeless tobacco. He reports that he does not drink alcohol and does not use drugs.  Allergies:  Allergies  Allergen Reactions   Feraheme [Ferumoxytol] Swelling and Other (See Comments)    Swelling of lips and tongue, could not talk 30 minutes after the infusion.   Latex Other (See Comments)    Latex butterfly bandages tore off the skin and caused terrible blisters   Sulfa Antibiotics Hives    Severe itching, blisters   Wound Dressing Adhesive Other (See Comments)    Latex butterfly bandages tore off the skin and caused terrible blisters   Levothyroxine Swelling and Other (See Comments)    Lip swelling   Doxycycline Nausea Only    No medications prior to admission.    No results found for this or any previous visit (from the past 48 hour(s)). No results found.  Review of Systems  All other systems reviewed and are negative.   There were no vitals taken for this visit. Physical Exam  Patient is alert, oriented, no adenopathy, well-dressed, normal affect, normal respiratory effort. Examination the wound measures 11 x  7 cm approximately 75% granulation tissue patient has too much pain with attempted debridement of the fibrinous exudative tissue.  There is no ascending cellulitis there is no purulent drainage no exposed bone or tendon. Assessment/Plan 1. 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  Plan for observation postoperatively to ensure wound VAC function.  Patient is currently on IV antibiotics through a PICC line.  Nadara Mustard, MD 01/12/2023, 6:55 AM

## 2023-01-12 NOTE — Anesthesia Postprocedure Evaluation (Signed)
Anesthesia Post Note  Patient: [name] Fernandez  Procedure(s) Performed: LEFT ANKLE DEBRIDEMENT (Left: Ankle) APPLICATION OF WOUND VAC (Left: Ankle)     Patient location during evaluation: PACU Anesthesia Type: General Level of consciousness: awake Pain management: pain level controlled Vital Signs Assessment: post-procedure vital signs reviewed and stable Respiratory status: spontaneous breathing, nonlabored ventilation and respiratory function stable Cardiovascular status: blood pressure returned to baseline and stable Postop Assessment: no apparent nausea or vomiting Anesthetic complications: no   No notable events documented.  Last Vitals:  Vitals:   01/12/23 1345 01/12/23 1439  BP: (!) 158/88 (!) 174/99  Pulse: 70 85  Resp: 10 17  Temp:  36.7 C  SpO2: 91% 94%    Last Pain:  Vitals:   01/12/23 1439  TempSrc: Oral  PainSc:                   P 

## 2023-01-12 NOTE — Op Note (Signed)
01/12/2023  11:31 AM  PATIENT:  [name] Fernandez    PRE-OPERATIVE DIAGNOSIS:  Left Ankle Wound  POST-OPERATIVE DIAGNOSIS:  Same  PROCEDURE:  LEFT ANKLE EXCISIONAL DEBRIDEMENT, with excision skin and soft tissue muscle fascia and tendon. APPLICATION OF CLEANSE CHOICE WOUND VAC Application Kerecis micro graft 95 cm to cover a wound surface area of 15 x 9 cm Tissue sent for cultures. Irrigation with Vashe  SURGEON:  Nadara Mustard, MD  PHYSICIAN ASSISTANT:None ANESTHESIA:   General  PREOPERATIVE INDICATIONS:  [name] is a  82 y.o. male with a diagnosis of Left Ankle Wound who failed conservative measures and elected for surgical management.    The risks benefits and alternatives were discussed with the patient preoperatively including but not limited to the risks of infection, bleeding, nerve injury, cardiopulmonary complications, the need for revision surgery, among others, and the patient was willing to proceed.  OPERATIVE IMPLANTS:   Implant Name Type Inv. Item Serial No. Manufacturer Lot No. LRB No. Used Action  GRAFT SKIN WND SURGICLOSE M95 - [id number] Tissue GRAFT SKIN WND SURGICLOSE M95  [employer] [id number] Left 1 Implanted    @ENCIMAGES @  OPERATIVE FINDINGS: Patient had necrotic changes of the peroneal tendons.  Necrotic tendons were excised.  There was no exposed bone.  There is no abscess.  Tissue was sent for cultures.  OPERATIVE PROCEDURE: Patient was brought the operating room and underwent general anesthetic.  After adequate levels anesthesia obtained patient's left lower extremity was prepped using DuraPrep draped into a sterile field a timeout was called.  A 21 blade knife was used to excise skin and soft tissue muscle fascia and tendon.  There was necrotic changes of the peroneal tendons and these were excised back to healthy viable tendon structure.  Muscle was excised Fredrik Cove was also used for further excision of the soft tissue.  Wound surface area was 15 x  9 cm after debridement there was good petechial bleeding.  The wound was irrigated with Vashe.  Kerecis micro graft 95 cm was applied to the wound this was covered with a cleanse choice wound VAC sponge derma tack and Coban.  This had a good suction fit patient was taken the PACU in stable condition.   DISCHARGE PLANNING:  Antibiotic duration: Continue IV antibiotics adjust if necessary  Weightbearing: Weightbearing as tolerated with the short fracture boot.  Pain medication: Opioid pathway  Dressing care/ Wound VAC: Wound VAC  Ambulatory devices: Walker as needed  Discharge to: Anticipate discharge to home after observation.  Follow-up: In the office 1 week post operative.

## 2023-01-12 NOTE — Interval H&P Note (Signed)
History and Physical Interval Note:  01/12/2023 9:16 AM  [name] Fernandez  has presented today for surgery, with the diagnosis of Left Ankle Wound.  The various methods of treatment have been discussed with the patient and family. After consideration of risks, benefits and other options for treatment, the patient has consented to  Procedure(s): LEFT ANKLE DEBRIDEMENT (Left) as a surgical intervention.  The patient's history has been reviewed, patient examined, no change in status, stable for surgery.  I have reviewed the patient's chart and labs.  Questions were answered to the patient's satisfaction.     Nadara Mustard

## 2023-01-12 NOTE — Transfer of Care (Signed)
Immediate Anesthesia Transfer of Care Note  Patient: [name] Fernandez  Procedure(s) Performed: LEFT ANKLE DEBRIDEMENT (Left: Ankle) APPLICATION OF WOUND VAC (Left: Ankle)  Patient Location: PACU  Anesthesia Type:General  Level of Consciousness: drowsy and patient cooperative  Airway & Oxygen Therapy: Patient Spontanous Breathing  Post-op Assessment: Report given to RN, Post -op Vital signs reviewed and stable, and Patient moving all extremities X 4  Post vital signs: Reviewed and stable  Last Vitals:  Vitals Value Taken Time  BP 142/75 01/12/23 1121  Temp    Pulse 66 01/12/23 1124  Resp 17 01/12/23 1124  SpO2 94 % 01/12/23 1124  Vitals shown include unfiled device data.  Last Pain:  Vitals:   01/12/23 0914  TempSrc:   PainSc: 0-No pain      Patients Stated Pain Goal: 0 (01/12/23 0914)  Complications: No notable events documented.

## 2023-01-12 NOTE — Anesthesia Preprocedure Evaluation (Signed)
Anesthesia Evaluation  Patient identified by MRN, date of birth, ID band Patient awake    Reviewed: Allergy & Precautions, H&P , NPO status , Patient's Chart, lab work & pertinent test results  Airway Mallampati: II  TM Distance: >3 FB Neck ROM: Full    Dental no notable dental hx.    Pulmonary neg pulmonary ROS, former smoker   Pulmonary exam normal breath sounds clear to auscultation       Cardiovascular Normal cardiovascular exam Rhythm:Regular Rate:Normal     Neuro/Psych negative neurological ROS  negative psych ROS   GI/Hepatic Neg liver ROS,GERD  Medicated,,  Endo/Other  diabetesHypothyroidism    Renal/GU Renal InsufficiencyRenal disease  negative genitourinary   Musculoskeletal negative musculoskeletal ROS (+)    Abdominal   Peds negative pediatric ROS (+)  Hematology negative hematology ROS (+)   Anesthesia Other Findings   Reproductive/Obstetrics negative OB ROS                             Anesthesia Physical Anesthesia Plan  ASA: 2  Anesthesia Plan: General   Post-op Pain Management: Minimal or no pain anticipated   Induction: Intravenous  PONV Risk Score and Plan: 2 and Ondansetron and Treatment may vary due to age or medical condition  Airway Management Planned: LMA  Additional Equipment:   Intra-op Plan:   Post-operative Plan: Extubation in OR  Informed Consent: I have reviewed the patients History and Physical, chart, labs and discussed the procedure including the risks, benefits and alternatives for the proposed anesthesia with the patient or authorized representative who has indicated his/her understanding and acceptance.     Dental advisory given  Plan Discussed with: CRNA and Surgeon  Anesthesia Plan Comments:        Anesthesia Quick Evaluation

## 2023-01-12 NOTE — Anesthesia Procedure Notes (Signed)
Procedure Name: LMA Insertion Date/Time: 01/12/2023 10:57 AM  Performed by: Alease Medina, CRNAPre-anesthesia Checklist: Patient identified, Emergency Drugs available, Suction available and Patient being monitored Patient Re-evaluated:Patient Re-evaluated prior to induction Oxygen Delivery Method: Circle system utilized Preoxygenation: Pre-oxygenation with 100% oxygen Induction Type: IV induction LMA: LMA with gastric port inserted LMA Size: 4.0 Number of attempts: 1 Placement Confirmation: positive ETCO2, breath sounds checked- equal and bilateral and CO2 detector Tube secured with: Tape Dental Injury: Teeth and Oropharynx as per pre-operative assessment  Comments: Placed by EMT student Clifton Custard

## 2023-01-13 DIAGNOSIS — K219 Gastro-esophageal reflux disease without esophagitis: Secondary | ICD-10-CM | POA: Diagnosis not present

## 2023-01-13 DIAGNOSIS — Z881 Allergy status to other antibiotic agents status: Secondary | ICD-10-CM | POA: Diagnosis not present

## 2023-01-13 DIAGNOSIS — K900 Celiac disease: Secondary | ICD-10-CM | POA: Diagnosis not present

## 2023-01-13 DIAGNOSIS — I10 Essential (primary) hypertension: Secondary | ICD-10-CM | POA: Diagnosis not present

## 2023-01-13 DIAGNOSIS — Z91040 Latex allergy status: Secondary | ICD-10-CM | POA: Diagnosis not present

## 2023-01-13 DIAGNOSIS — Z87891 Personal history of nicotine dependence: Secondary | ICD-10-CM | POA: Diagnosis not present

## 2023-01-13 DIAGNOSIS — E039 Hypothyroidism, unspecified: Secondary | ICD-10-CM | POA: Diagnosis not present

## 2023-01-13 DIAGNOSIS — L97323 Non-pressure chronic ulcer of left ankle with necrosis of muscle: Secondary | ICD-10-CM | POA: Diagnosis not present

## 2023-01-13 DIAGNOSIS — Z888 Allergy status to other drugs, medicaments and biological substances status: Secondary | ICD-10-CM | POA: Diagnosis not present

## 2023-01-13 DIAGNOSIS — S91002D Unspecified open wound, left ankle, subsequent encounter: Secondary | ICD-10-CM | POA: Diagnosis not present

## 2023-01-13 DIAGNOSIS — R7303 Prediabetes: Secondary | ICD-10-CM | POA: Diagnosis not present

## 2023-01-13 DIAGNOSIS — Z882 Allergy status to sulfonamides status: Secondary | ICD-10-CM | POA: Diagnosis not present

## 2023-01-13 DIAGNOSIS — Z96651 Presence of right artificial knee joint: Secondary | ICD-10-CM | POA: Diagnosis not present

## 2023-01-13 DIAGNOSIS — Z959 Presence of cardiac and vascular implant and graft, unspecified: Secondary | ICD-10-CM | POA: Diagnosis not present

## 2023-01-13 DIAGNOSIS — Z800 Family history of malignant neoplasm of digestive organs: Secondary | ICD-10-CM | POA: Diagnosis not present

## 2023-01-13 MED ORDER — CHLORHEXIDINE GLUCONATE CLOTH 2 % EX PADS
6.0000 | MEDICATED_PAD | Freq: Every day | CUTANEOUS | Status: DC
  Administered 2023-01-13 – 2023-01-16 (×4): 6 via TOPICAL

## 2023-01-13 MED ORDER — DIPHENHYDRAMINE HCL 25 MG PO CAPS
25.0000 mg | ORAL_CAPSULE | Freq: Four times a day (QID) | ORAL | Status: DC | PRN
  Administered 2023-01-13 (×2): 25 mg via ORAL
  Filled 2023-01-13 (×3): qty 1

## 2023-01-13 NOTE — Progress Notes (Signed)
Patients wife is worried he will develop a blood clot if he does not receive his heparin injection.

## 2023-01-13 NOTE — Progress Notes (Signed)
Patient developed a rash/redness per nightshift and patient report. Dema Severin, MD

## 2023-01-13 NOTE — Progress Notes (Signed)
   01/13/23 0029  Provider Notification  Provider Name/Title DUDA, MARCUS  Date Provider Notified 01/13/23  Time Provider Notified [phone]  Method of Notification Page (SECURE CHAT)  Notification Reason Change in status (itching bilaterally on upper arms after initiation of zosyn.)  Provider response Other (Comment) (no response overnight. contact pharmacy.)   Contacted pharmacy and was informed to wait until dayshift provider comes in. Notified charge nurse as soon as this took place. Medication stopped immediately. Pt denied any chest pain, sob, or any other symptoms other than itching and redness bilaterally. Notified pharmacy and was told to hold dose until dayshift provider comes in to address. No benadryl on file at this time. Provided q1 hour checks and no change in status over the next 4 hours for patient. Vitals are stable at this time and in chart.

## 2023-01-13 NOTE — Progress Notes (Signed)
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 Donnalee Curry, MD

## 2023-01-13 NOTE — Progress Notes (Signed)
Assessment done. Patients Wound Vac was not plugged in or turned on when I did my assessment. Notified MD.  Aleen Sells is now on. Bandage and seal is C/D/I.

## 2023-01-13 NOTE — Evaluation (Signed)
Physical Therapy Evaluation Patient Details Name: [name] Fernandez MRN: [id number] DOB: 1940-11-23 Today's Date: 01/13/2023  History of Present Illness  Pt is 82 yo male presenting with chronic L ankle wound for excisional debridement on 01/13/23. WBAT in short fracture boot. PMH: Anemia,celiac disease, esophageal stricture, GERD, Hiatal hernia, HTN, hypothryroidism, pre-diabetes, shortness of breath, vit B12 deficiency, Vit D deficiency.  Clinical Impression  Pt is presenting slightly below baseline. Pt was unstable on first standing but with very short distance gait improved with boot on surgical foot and shoe on the other foot.  Pt was CGA for sit to stand stand and short distance gait currently. Pt is cleared for home with a little assistance from a physical therapy standpoint once medically stable. No recommended skilled physical therapy services on discharge from acute care hospital setting.       If plan is discharge home, recommend the following: Assistance with cooking/housework;Assist for transportation   Can travel by private vehicle        Equipment Recommendations None recommended by PT  Recommendations for Other Services       Functional Status Assessment Patient has had a recent decline in their functional status and demonstrates the ability to make significant improvements in function in a reasonable and predictable amount of time.     Precautions / Restrictions Precautions Precautions: Fall Restrictions Weight Bearing Restrictions: No LLE Weight Bearing: Weight bearing as tolerated Other Position/Activity Restrictions: in boot per Dr. Lajoyce Corners Surgical note      Mobility  Bed Mobility Overal bed mobility: Modified Independent       Patient Response: Cooperative  Transfers Overall transfer level: Needs assistance Equipment used: None Transfers: Sit to/from Stand Sit to Stand: Min guard           General transfer comment: pt was slightly unstable on standing  with multi directional sway; improved with shoe on    Ambulation/Gait Ambulation/Gait assistance: Min guard Gait Distance (Feet): 40 Feet Assistive device: None Gait Pattern/deviations: Step-through pattern, Decreased stance time - left, Decreased step length - left, Decreased step length - right   Gait velocity interpretation: 1.31 - 2.62 ft/sec, indicative of limited community ambulator   General Gait Details: with boot, antalgic gait, limited gait distance due to recent surgery, WBAT per MD surgical note.  Stairs Stairs:  (pt has a ramp)          Wheelchair Mobility     Tilt Bed Tilt Bed Patient Response: Cooperative  Modified Rankin (Stroke Patients Only)       Balance Overall balance assessment: Needs assistance   Sitting balance-Leahy Scale: Normal     Standing balance support: No upper extremity supported Standing balance-Leahy Scale: Fair Standing balance comment: multi directional sway       Pertinent Vitals/Pain Pain Assessment Pain Assessment: 0-10 Pain Score: 5  Pain Location: L foot Pain Descriptors / Indicators: Burning, Aching Pain Intervention(s): Monitored during session, Premedicated before session    Home Living Family/patient expects to be discharged to:: Private residence Living Arrangements: Spouse/significant other Available Help at Discharge: Family;Available 24 hours/day Type of Home: House Home Access: Ramped entrance       Home Layout: One level Home Equipment: None      Prior Function Prior Level of Function : Independent/Modified Independent             Mobility Comments: independent without DME       Hand Dominance   Dominant Hand: Right    Extremity/Trunk Assessment  Upper Extremity Assessment Upper Extremity Assessment: Overall WFL for tasks assessed    Lower Extremity Assessment Lower Extremity Assessment: Overall WFL for tasks assessed    Cervical / Trunk Assessment Cervical / Trunk  Assessment: Normal  Communication   Communication: No difficulties  Cognition Arousal/Alertness: Awake/alert Behavior During Therapy: WFL for tasks assessed/performed Overall Cognitive Status: Within Functional Limits for tasks assessed            General Comments General comments (skin integrity, edema, etc.): Dressing is clean, dry and intact        Assessment/Plan    PT Assessment Patient needs continued PT services  PT Problem List Decreased mobility;Decreased balance       PT Treatment Interventions DME instruction;Therapeutic activities;Gait training;Therapeutic exercise;Patient/family education;Balance training;Functional mobility training    PT Goals (Current goals can be found in the Care Plan section)  Acute Rehab PT Goals Patient Stated Goal: To return home and avoid falls PT Goal Formulation: With patient Time For Goal Achievement: 01/27/23 Potential to Achieve Goals: Good    Frequency Min 1X/week        AM-PAC PT "6 Clicks" Mobility  Outcome Measure Help needed turning from your back to your side while in a flat bed without using bedrails?: None Help needed moving from lying on your back to sitting on the side of a flat bed without using bedrails?: None Help needed moving to and from a bed to a chair (including a wheelchair)?: A Little Help needed standing up from a chair using your arms (e.g., wheelchair or bedside chair)?: A Little Help needed to walk in hospital room?: A Little Help needed climbing 3-5 steps with a railing? : A Lot 6 Click Score: 19    End of Session Equipment Utilized During Treatment: Gait belt Activity Tolerance: Patient tolerated treatment well Patient left: in bed;with call bell/phone within reach Nurse Communication: Mobility status PT Visit Diagnosis: Unsteadiness on feet (R26.81);Other abnormalities of gait and mobility (R26.89)    Time: 1610-9604 PT Time Calculation (min) (ACUTE ONLY): 23 min   Charges:   PT  Evaluation $PT Eval Low Complexity: 1 Low PT Treatments $Therapeutic Activity: 8-22 mins PT General Charges $$ ACUTE PT VISIT: 1 Visit         Harrel Carina, DPT, CLT  Acute Rehabilitation Services Office: [phone] (Secure chat preferred)   Claudia Desanctis 01/13/2023, 1:43 PM

## 2023-01-13 NOTE — Progress Notes (Signed)
Patient ID: [name] Fernandez, male   DOB: 1941/02/23, 82 y.o.   MRN: [id number] Patient is postoperative day 1 debridement left lateral foot and ankle wound.  Cultures are pending the VAC was off most of last night suction was reestablished.  There is 100 cc in the wound VAC canister there is a good suction fit.  Patient will continue the Unasyn I have discussed this with pharmacy recommend use Benadryl for IV Unasyn.  Possible discharge on Monday pending culture sensitivities.

## 2023-01-14 MED ORDER — RIVAROXABAN 10 MG PO TABS
10.0000 mg | ORAL_TABLET | Freq: Every day | ORAL | Status: DC
  Administered 2023-01-14 – 2023-01-16 (×3): 10 mg via ORAL
  Filled 2023-01-14 (×3): qty 1

## 2023-01-14 NOTE — Progress Notes (Signed)
Patient is 82 year old male who is POD 2 s/p left ankle excisional debridement with wound VAC placement.  Reports that he is doing well overall.  He has been ambulatory with physical therapy who has signed off according to patient.  He feels most stable in a postop shoe rather than the boot so this was placed on his foot instead of the boot.  He denies any chest pain or shortness of breath.  No calf pain.  Starting Xarelto today; he has questions on if he will be continuing Xarelto after discharge.  On exam, soft dressing in place.  Intact ankle dorsiflexion plantarflexion.  No calf tenderness.  Negative Homans' sign bilaterally.  Wound VAC intact with suction intact and there is about 150 cc of sanguinous drainage in the wound VAC canister.  Plan is continue antibiotics.  He has PICC line.  Currently on Zosyn.  Cultures not growing anything as of yet.  Dr. Lajoyce Corners to see tomorrow.

## 2023-01-14 NOTE — Plan of Care (Signed)
  Problem: Pain Managment: Goal: General experience of comfort will improve Outcome: Progressing   Problem: Activity: Goal: Risk for activity intolerance will decrease Outcome: Progressing   Problem: Education: Goal: Knowledge of General Education information will improve Description: Including pain rating scale, medication(s)/side effects and non-pharmacologic comfort measures Outcome: Progressing   

## 2023-01-14 NOTE — Plan of Care (Signed)

## 2023-01-14 NOTE — Progress Notes (Signed)
Physical Therapy Treatment and Discharge Patient Details Name: [name] Fernandez MRN: [id number] DOB: 1940/11/10 Today's Date: 01/14/2023   History of Present Illness Pt is 82 yo male presenting with chronic L ankle wound for excisional debridement on 01/13/23. WBAT in short fracture boot. PMH: Anemia,celiac disease, esophageal stricture, GERD, Hiatal hernia, HTN, hypothryroidism, pre-diabetes, shortness of breath, vit B12 deficiency, Vit D deficiency.    PT Comments  Patient mobilizing modified independent with post-op shoe (approved by orthopedics, instead of camwalker). Demonstrated ability to manage IV pole with VAC machine attached. Notified RN that pt can be up alone or with family and she was in agreement. No further PT needs with all goals met.     If plan is discharge home, recommend the following: Assistance with cooking/housework;Assist for transportation   Can travel by private vehicle        Equipment Recommendations  None recommended by PT    Recommendations for Other Services       Precautions / Restrictions Precautions Precautions: Fall Restrictions Weight Bearing Restrictions: No LLE Weight Bearing: Weight bearing as tolerated Other Position/Activity Restrictions: in shoe per Dr. Lajoyce Corners progress note     Mobility  Bed Mobility Overal bed mobility: Modified Independent                  Transfers Overall transfer level: Needs assistance Equipment used: None Transfers: Sit to/from Stand Sit to Stand: Independent           General transfer comment: using post-op shoe (okayed by MD) and shoe on other foot, pt was steady    Ambulation/Gait Ambulation/Gait assistance: Modified independent (Device/Increase time) Gait Distance (Feet): 110 Feet Assistive device: IV Pole (to simulate cane) Gait Pattern/deviations: Step-through pattern, Decreased stance time - left, Decreased step length - left, Decreased step length - right   Gait velocity interpretation:  1.31 - 2.62 ft/sec, indicative of limited community ambulator   General Gait Details: with post-op shoe, mildly antalgic gait; states he uses a cane at home   Stairs             Wheelchair Mobility     Tilt Bed    Modified Rankin (Stroke Patients Only)       Balance Overall balance assessment: Needs assistance   Sitting balance-Leahy Scale: Normal     Standing balance support: No upper extremity supported Standing balance-Leahy Scale: Fair                              Cognition Arousal/Alertness: Awake/alert Behavior During Therapy: WFL for tasks assessed/performed Overall Cognitive Status: Within Functional Limits for tasks assessed                                          Exercises      General Comments General comments (skin integrity, edema, etc.): Wife present. Confirms that pt manages an IV pole and VAC dressing at home (and has done so on previous hospitalizations).      Pertinent Vitals/Pain Pain Assessment Pain Assessment: 0-10 Pain Score: 2  Breathing: normal Pain Location: L foot Pain Descriptors / Indicators: Burning, Aching Pain Intervention(s): Limited activity within patient's tolerance, Monitored during session    Home Living  Prior Function            PT Goals (current goals can now be found in the care plan section) Acute Rehab PT Goals Patient Stated Goal: To return home and avoid falls PT Goal Formulation: With patient Time For Goal Achievement: 01/27/23 Potential to Achieve Goals: Good Progress towards PT goals: Goals met/education completed, patient discharged from PT    Frequency    Min 1X/week      PT Plan Current plan remains appropriate    Co-evaluation              AM-PAC PT "6 Clicks" Mobility   Outcome Measure  Help needed turning from your back to your side while in a flat bed without using bedrails?: None Help needed moving from  lying on your back to sitting on the side of a flat bed without using bedrails?: None Help needed moving to and from a bed to a chair (including a wheelchair)?: None Help needed standing up from a chair using your arms (e.g., wheelchair or bedside chair)?: None Help needed to walk in hospital room?: None Help needed climbing 3-5 steps with a railing? : A Lot 6 Click Score: 22    End of Session   Activity Tolerance: Patient tolerated treatment well Patient left: in bed;with call bell/phone within reach;with family/visitor present Nurse Communication: Mobility status;Other (comment) (VAC hooked to IV pole and pt manages independently (as he does at home)) PT Visit Diagnosis: Unsteadiness on feet (R26.81);Other abnormalities of gait and mobility (R26.89)   PT Discharge Note  Patient is being discharged from PT services secondary to:  Goals met and no further therapy needs identified.  Please see latest Therapy Progress Note for current level of functioning and progress toward goals.  Progress and discharge plan and discussed with patient/caregiver and they  Agree   Time: 1350-1405 PT Time Calculation (min) (ACUTE ONLY): 15 min  Charges:    $Gait Training: 8-22 mins PT General Charges $$ ACUTE PT VISIT: 1 Visit                      Jerolyn Center, PT Acute Rehabilitation Services  Office [phone]    Zena Amos 01/14/2023, 2:13 PM

## 2023-01-15 ENCOUNTER — Encounter (HOSPITAL_COMMUNITY): Payer: Self-pay | Admitting: Orthopedic Surgery

## 2023-01-15 DIAGNOSIS — S91002D Unspecified open wound, left ankle, subsequent encounter: Secondary | ICD-10-CM

## 2023-01-15 LAB — CBC WITH DIFFERENTIAL/PLATELET
Abs Immature Granulocytes: 0.02 10*3/uL (ref 0.00–0.07)
Basophils Absolute: 0.1 10*3/uL (ref 0.0–0.1)
Basophils Relative: 1 %
Eosinophils Absolute: 0.8 10*3/uL — ABNORMAL HIGH (ref 0.0–0.5)
Eosinophils Relative: 9 %
HCT: 33 % — ABNORMAL LOW (ref 39.0–52.0)
Hemoglobin: 11.2 g/dL — ABNORMAL LOW (ref 13.0–17.0)
Immature Granulocytes: 0 %
Lymphocytes Relative: 26 %
Lymphs Abs: 2.3 10*3/uL (ref 0.7–4.0)
MCH: 31.5 pg (ref 26.0–34.0)
MCHC: 33.9 g/dL (ref 30.0–36.0)
MCV: 93 fL (ref 80.0–100.0)
Monocytes Absolute: 0.9 10*3/uL (ref 0.1–1.0)
Monocytes Relative: 10 %
Neutro Abs: 4.8 10*3/uL (ref 1.7–7.7)
Neutrophils Relative %: 54 %
Platelets: 340 10*3/uL (ref 150–400)
RBC: 3.55 MIL/uL — ABNORMAL LOW (ref 4.22–5.81)
RDW: 14.3 % (ref 11.5–15.5)
WBC: 8.9 10*3/uL (ref 4.0–10.5)
nRBC: 0 % (ref 0.0–0.2)

## 2023-01-15 LAB — BASIC METABOLIC PANEL
Anion gap: 11 (ref 5–15)
BUN: 15 mg/dL (ref 8–23)
CO2: 26 mmol/L (ref 22–32)
Calcium: 9.1 mg/dL (ref 8.9–10.3)
Chloride: 102 mmol/L (ref 98–111)
Creatinine, Ser: 1.2 mg/dL (ref 0.61–1.24)
GFR, Estimated: >60 mL/min (ref >60)
Glucose, Bld: 96 mg/dL (ref 70–99)
Potassium: 4 mmol/L (ref 3.5–5.1)
Sodium: 139 mmol/L (ref 135–145)

## 2023-01-15 LAB — SEDIMENTATION RATE: Sed Rate: 34 mm/hr — ABNORMAL HIGH (ref 0–16)

## 2023-01-15 LAB — C-REACTIVE PROTEIN: CRP: 0.7 mg/dL (ref <1.0)

## 2023-01-15 MED ORDER — SODIUM CHLORIDE 0.9% FLUSH: 10.0000 mL | INTRAVENOUS | Status: DC | PRN

## 2023-01-15 MED ORDER — POTASSIUM CHLORIDE CRYS ER 20 MEQ PO TBCR
20.0000 meq | EXTENDED_RELEASE_TABLET | Freq: Once | ORAL | Status: AC
  Administered 2023-01-15: 20 meq via ORAL
  Filled 2023-01-15: qty 1

## 2023-01-15 NOTE — Plan of Care (Signed)

## 2023-01-15 NOTE — TOC Initial Note (Addendum)
Transition of Care (TOC) - Initial/Assessment Note   Spoke to patient at bedside. Patient from home with wife. Patient active with [name] Fernandez for [facility] and [name] Fernandez for home infusion .   [name] Fernandez with [name] Fernandez and [name] Fernandez with [name] Fernandez aware of patient admission.   Will need updated OPAT and HHRN order.   Patient has cane, walker and shower seat at home.    Patient Details  Name: [name] Fernandez MRN: [id number] Date of Birth: [birth year]  Transition of Care [facility]) CM/SW Contact:    [name] Plan, RN Phone Number: 01/15/2023, 10:55 AM  Clinical Narrative:                   Expected Discharge Fernandez: Home w Home Health Services Barriers to Discharge: Continued Medical Work up (cultures pending)   Patient Goals and CMS Choice Patient states their goals for this hospitalization and ongoing recovery are:: to return to home CMS Medicare.gov Compare Post Acute Care list provided to:: Patient Choice offered to / list presented to : Patient [place] ownership interest in [facility].provided to:: Patient    Expected Discharge Fernandez and Services In-house Referral: NA Discharge Planning Services: CM Consult Post Acute Care Choice: Home Health Living arrangements for the past 2 months: Single Family Home                 DME Arranged: N/A         HH Arranged: RN HH Agency: [facility] (will need orders and new OPAT) Date HH Agency Contacted: 01/15/23 Time HH Agency Contacted: [phone] Representative spoke with at [facility] Agency: [name] Fernandez  Prior Living Arrangements/Services Living arrangements for the past 2 months: Single Family Home Lives with:: Spouse Patient language and need for interpreter reviewed:: Yes Do you feel safe going back to the place where you live?: Yes      Need for Family Participation in Patient Care: Yes (Comment) Care giver support system in place?: Yes (comment) Current home services: DME Criminal Activity/Legal Involvement Pertinent to Current  Situation/Hospitalization: No - Comment as needed  Activities of Daily Living Home Assistive Devices/Equipment: Eyeglasses ADL Screening (condition at time of admission) Patient's cognitive ability adequate to safely complete daily activities?: Yes Is the patient deaf or have difficulty hearing?: No Does the patient have difficulty seeing, even when wearing glasses/contacts?: No Does the patient have difficulty concentrating, remembering, or making decisions?: No Patient able to express need for assistance with ADLs?: Yes Does the patient have difficulty dressing or bathing?: No Independently performs ADLs?: Yes (appropriate for developmental age) Does the patient have difficulty walking or climbing stairs?: Yes Weakness of Legs: Both Weakness of Arms/Hands: None  Permission Sought/Granted   Permission granted to share information with : Yes, Verbal Permission Granted     Permission granted to share info w AGENCY: [name] Fernandez and [name] Fernandez        Emotional Assessment Appearance:: Appears stated age Attitude/Demeanor/Rapport: Engaged Affect (typically observed): Accepting Orientation: : Oriented to Self, Oriented to Place, Oriented to  Time, Oriented to Situation Alcohol / Substance Use: Not Applicable Psych Involvement: No (comment)  Admission diagnosis:  Ankle wound, left, initial encounter [S91.002A] Patient Active Problem List   Diagnosis Date Noted   Ankle wound, left, initial encounter 01/12/2023   Ankle ulcer, left, with necrosis of muscle (HCC) 11/10/2022   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) 06/16/2022   Osteomyelitis (HCC) 06/16/2022   Tenosynovitis of tibialis posterior tendon 06/15/2022   Wound infection 06/15/2022  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   Anemia 03/13/2019   Chronic venous insufficiency 03/12/2019   CKD (chronic kidney disease) stage 2, 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/2015   COPD (chronic obstructive pulmonary disease) (HCC)  12/08/2015   Chronic pain syndrome 05/26/2015   DDD (degenerative disc disease), lumbar 05/26/2015   Medication management 05/26/2015   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   Lumbago with sciatica 08/06/2014   Hyperlipidemia 08/20/2013   Hypertension    Abnormal glucose    Vitamin D deficiency    Arthritis    Vitamin B12 deficiency    Celiac disease 06/19/2012   GERD (gastroesophageal reflux disease) 04/30/2012   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 tube) procedure 04/30/2012   Hypothyroidism 04/30/2012   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 AT [address] OF [facility] [address] [place] [address] Phone: [phone] Fax: [phone]     Social Determinants of Health (SDOH) Social History: SDOH Screenings   Food Insecurity: No Food Insecurity (01/12/2023)  Housing: Low Risk  (01/12/2023)  Transportation Needs: No Transportation Needs (01/12/2023)  Utilities: Not At Risk (01/12/2023)  Depression (PHQ2-9): Low Risk  (11/15/2022)  Tobacco Use: Medium Risk (01/12/2023)   SDOH Interventions:     Readmission Risk Interventions    06/20/2022   11:21 AM 06/19/2022    2:50 PM  Readmission Risk Prevention Fernandez  Transportation Screening Complete Complete  PCP or Specialist Appt within 5-7 Days Complete Complete  Home Care Screening Complete Complete  Medication Review (RN CM) Complete Complete

## 2023-01-15 NOTE — Progress Notes (Signed)
[facility]    Date of Admission:  01/12/2023   Total days of antibiotics 4          ID: [name] Fernandez is a 82 y.o. male with  left ankle poorly healing wound, necrotic tension s/p I x D on 01/12/23 Principal Problem:   Ankle wound, left, initial encounter Active Problems:   Ankle ulcer, left, with necrosis of muscle (HCC)    Subjective: Afebrile. He reports his wound vac lost seal/beeping last night.  Medications:   Chlorhexidine Gluconate Cloth  6 each Topical Daily   docusate sodium  100 mg Oral BID   melatonin  3 mg Oral QHS   pantoprazole  40 mg Oral Daily   rivaroxaban  10 mg Oral Daily   torsemide  20 mg Oral q morning    Objective: Vital signs in last 24 hours: Temp:  [98 F (36.7 C)-98.3 F (36.8 C)] 98.2 F (36.8 C) (08/05 0846) Pulse Rate:  [70-85] 84 (08/05 0846) Resp:  [16-17] 17 (08/05 0846) BP: (159-164)/(69-99) 163/99 (08/05 0846) SpO2:  [93 %-97 %] 96 % (08/05 0846)  Physical Exam  Constitutional: He is oriented to person, place, and time. He appears well-developed and well-nourished. No distress.  HENT:  Mouth/Throat: Oropharynx is clear and moist. No oropharyngeal exudate.  Cardiovascular: Normal rate, regular rhythm and normal heart sounds. Exam reveals no gallop and no friction rub.  No murmur heard.  Pulmonary/Chest: Effort normal and breath sounds normal. No respiratory distress. He has no wheezes.  Abdominal: Soft. Bowel sounds are normal. He exhibits no distension. There is no tenderness.  Lymphadenopathy:  He has no cervical adenopathy.  UJW:JXBJ ankle wound vac in place; picc line dressing to right arm is c/d/i Neurological: He is alert and oriented to person, place, and time.  Skin: Skin is warm and dry. No rash noted. No erythema.  Psychiatric: He has a normal mood and affect. His behavior is normal.    Lab Results Recent Labs    01/15/23 0511  NA 139  K 3.4*  CL 102  CO2 28  BUN 15  CREATININE 1.34*     Microbiology: 01/12/23 cx ngtd  Remote cx: 11/10/2022  ABUNDANT PSEUDOMONAS AERUGINOSA FEW ENTEROCOCCUS FAECALIS MODERATE FINEGOLDIA MAGNA   Studies/Results: No results found.   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 We will follow up on his culture results to see if any further changes need to  be made. We will see him in clinic in 2 wk to see if need to extend abtx or observe off of abtx. Hopefully the later.  We will check sed rate, crp and kidney function to monitor.  Will establish follow up in 2 wk.  I have personally spent 35 minutes involved in face-to-face and non-face-to-face activities for this patient on the day of the visit. Professional time spent includes the following activities: Preparing to see the patient (review of tests), Obtaining and/or reviewing separately obtained history (admission/discharge record), Performing a medically appropriate examination and/or evaluation , Ordering medications/tests/procedures, referring and communicating with other health care professionals, Documenting clinical information in the EMR, Independently interpreting results (not separately reported), Communicating results to the patient/family/caregiver, Counseling and educating the patient/family/caregiver and Care coordination (not separately reported).     [facility] for Infectious  Diseases Pager: [phone]  01/15/2023, 3:14 PM

## 2023-01-15 NOTE — Progress Notes (Signed)
PHARMACY CONSULT NOTE FOR:  OUTPATIENT  PARENTERAL ANTIBIOTIC THERAPY (OPAT)  Indication: L-ankle wound Regimen: Zosyn 4.5g every 8 hours End date: 01/30/23  Noted ideal interval for outpatient intermittent dosing that is not extended interval or continuous infusion is q6h, however family refusing q6h interval or switch to continuous infusion. Will attempt higher dosing q8h for now - noted was on lower dose of 3.375g/8h PTA with no growth yet on cultures.   IV antibiotic discharge orders are pended. To discharging provider:  please sign these orders via discharge navigator,  Select New Orders & click on the button choice - Manage This Unsigned Work.     Thank you for allowing pharmacy to be a part of this patient's care.  Georgina Pillion, PharmD, BCPS, BCIDP Infectious Diseases Clinical Pharmacist 01/15/2023 2:55 PM   **Pharmacist phone directory can now be found on [email or web address] ([email or web address]).  Listed under [facility].

## 2023-01-15 NOTE — Telephone Encounter (Signed)
Patient's wife called office requesting virtual appt. Patient is currently admitted, but is supposed to discharge tomorrow.  States that picc line was left in place incase antbx needed to be extended.  Juanita Laster, RMA

## 2023-01-15 NOTE — Progress Notes (Signed)
Patient ID: GARBRIEL HEBER, male   DOB: September 09, 1940, 82 y.o.   MRN: [id number] Patient is status postdebridement left ankle wound.  The wound VAC tube was pulled out of the dressing this was reinforced by nursing with some tape.  There is 250 cc in the wound VAC canister currently there is a good suction fit.  Cultures are pending.  Anticipate discharge tomorrow.

## 2023-01-16 ENCOUNTER — Telehealth: Payer: Medicare HMO | Admitting: Internal Medicine

## 2023-01-16 DIAGNOSIS — L97323 Non-pressure chronic ulcer of left ankle with necrosis of muscle: Secondary | ICD-10-CM | POA: Diagnosis not present

## 2023-01-16 MED ORDER — PIPERACILLIN-TAZOBACTAM IV (FOR PTA / DISCHARGE USE ONLY): 4.5000 g | Freq: Three times a day (TID) | INTRAVENOUS | 0 refills | Status: AC

## 2023-01-16 MED ORDER — PIPERACILLIN-TAZOBACTAM IV (FOR PTA / DISCHARGE USE ONLY): 13.5000 g | INTRAVENOUS | 0 refills | Status: DC

## 2023-01-16 MED ORDER — HEPARIN SOD (PORK) LOCK FLUSH 100 UNIT/ML IV SOLN: 250.0000 [IU] | INTRAVENOUS | Status: DC | PRN

## 2023-01-16 MED ORDER — HEPARIN SOD (PORK) LOCK FLUSH 100 UNIT/ML IV SOLN
250.0000 [IU] | INTRAVENOUS | Status: AC | PRN
  Administered 2023-01-16: 250 [IU]

## 2023-01-16 MED ORDER — OXYCODONE-ACETAMINOPHEN 10-325 MG PO TABS
1.0000 | ORAL_TABLET | Freq: Three times a day (TID) | ORAL | 0 refills | Status: DC | PRN
Start: 2023-01-16 — End: 2023-04-11

## 2023-01-16 NOTE — Progress Notes (Signed)
Patient ID: [name] Fernandez, male   DOB: November 26, 1940, 82 y.o.   MRN: [id number] The back dressing was reinforced and has a good suction fit.  Patient will need to discharge on the Prevena plus portable wound VAC pump.  He will continue his current IV antibiotics at home.  Prescription for Percocet sent to the pharmacy.

## 2023-01-16 NOTE — Discharge Summary (Signed)
Discharge Diagnoses:  Principal Problem:   Ankle wound, left, initial encounter Active Problems:   Ankle ulcer, left, with necrosis of muscle (HCC)   Surgeries: Procedure(s): LEFT ANKLE DEBRIDEMENT APPLICATION OF WOUND VAC on 01/12/2023    Consultants:   Discharged Condition: Improved  Hospital Course: [name] Fernandez is an 82 y.o. male who was admitted 01/12/2023 with a chief complaint of left ankle wound, with a final diagnosis of Left Ankle Wound.  Patient was brought to the operating room on 01/12/2023 and underwent Procedure(s): LEFT ANKLE DEBRIDEMENT APPLICATION OF WOUND VAC.    Patient was given perioperative antibiotics:  Anti-infectives (From admission, onward)    Start     Dose/Rate Route Frequency Ordered Stop   01/16/23 0000  piperacillin-tazobactam (ZOSYN) IVPB        13.5 g Intravenous Every 24 hours 01/16/23 0735 01/31/23 2359   01/12/23 1600  piperacillin-tazobactam (ZOSYN) injection 3.375 g  Status:  Discontinued        3.375 g Intravenous 3 times daily 01/12/23 1429 01/12/23 1441   01/12/23 1530  piperacillin-tazobactam (ZOSYN) IVPB 3.375 g        3.375 g 12.5 mL/hr over 240 Minutes Intravenous Every 8 hours 01/12/23 1441     01/12/23 0900  ceFAZolin (ANCEF) IVPB 2g/100 mL premix        2 g 200 mL/hr over 30 Minutes Intravenous On call to O.R. 01/12/23 6295 01/12/23 1048     .  Patient was given sequential compression devices, early ambulation, and aspirin for DVT prophylaxis.  Recent vital signs: Patient Vitals for the past 24 hrs:  BP Temp Temp src Pulse Resp SpO2  01/16/23 0755 (!) 130/96 97.9 F (36.6 C) Oral 91 17 98 %  01/16/23 0633 (!) 169/91 97.9 F (36.6 C) Oral 75 -- 98 %  01/15/23 2000 (!) 143/79 98.2 F (36.8 C) Oral 67 -- 97 %  01/15/23 1519 (!) 147/87 98 F (36.7 C) Oral 82 16 99 %  .  Recent laboratory studies: No results found.  Discharge Medications:   Allergies as of 01/16/2023       Reactions   Feraheme [ferumoxytol] Swelling, Other  (See Comments)   Swelling of lips and tongue, could not talk 30 minutes after the infusion.   Latex Other (See Comments)   Latex butterfly bandages tore off the skin and caused terrible blisters   Sulfa Antibiotics Hives   Severe itching, blisters   Wound Dressing Adhesive Other (See Comments)   Latex butterfly bandages tore off the skin and caused terrible blisters   Levothyroxine Swelling, Other (See Comments)   Lip swelling   Doxycycline Nausea Only        Medication List     STOP taking these medications    acetaminophen 500 MG tablet Commonly known as: TYLENOL   HYDROcodone-acetaminophen 10-325 MG tablet Commonly known as: NORCO   piperacillin-tazobactam 3.375 (3-0.375) g injection Commonly known as: ZOSYN Replaced by: piperacillin-tazobactam IVPB       TAKE these medications    betamethasone valerate ointment 0.1 % Commonly known as: VALISONE Apply 1 Application topically 2 (two) times daily. What changed: when to take this   clotrimazole-betamethasone cream Commonly known as: LOTRISONE Apply 1 Application topically daily as needed (Rash).   gabapentin 600 MG tablet Commonly known as: Neurontin Take 1/2 to 1 tablet 2 to 3 x /Daily as needed for Chronic Pain   HEP-LOCK FLUSH IV Inject 1 application  into the vein daily.   hydrOXYzine 25  MG tablet Commonly known as: ATARAX TAKE 1 TABLET 3 X /DAY AS NEEDED FOR ANXIETY OR SLEEP What changed: See the new instructions.   naproxen sodium 220 MG tablet Commonly known as: ALEVE Take 220 mg by mouth at bedtime as needed (Muscle pain).   NON FORMULARY Take 2 capsules by mouth every morning. Thyroid Complete   omeprazole 20 MG tablet Commonly known as: PRILOSEC OTC Take 20 mg by mouth daily before breakfast.   oxyCODONE-acetaminophen 10-325 MG tablet Commonly known as: PERCOCET Take 1 tablet by mouth every 8 (eight) hours as needed.   piperacillin-tazobactam IVPB Commonly known as: ZOSYN Inject 13.5  g into the vein daily for 15 days. Indication:  L-ankle wound First Dose: Yes Last Day of Therapy:  01/30/23 Labs - Once weekly:  CBC/D and BMP, Labs - Once weekly: ESR and CRP Method of administration: Elastomeric (Continuous infusion) Method of administration may be changed at the discretion of home infusion pharmacist based upon assessment of the patient and/or caregiver's ability to self-administer the medication ordered. Replaces: piperacillin-tazobactam 3.375 (3-0.375) g injection   polyethylene glycol 17 g packet Commonly known as: MIRALAX / GLYCOLAX Take 17 g by mouth daily.   RED YEAST RICE PO Take 4,560 mg by mouth daily. 1140 mg each   senna-docusate 8.6-50 MG tablet Commonly known as: Senokot-S Take 2 tablets by mouth 2 (two) times daily. What changed:  how much to take when to take this reasons to take this   silver sulfADIAZINE 1 % cream Commonly known as: SILVADENE Apply 1 Application topically daily. Apply to affected area daily plus dry dressing   sodium chloride 0.9 % infusion Inject into the vein.   torsemide 20 MG tablet Commonly known as: DEMADEX Take  1 tablet  Daily for Fluid Retention / Leg Swelling What changed:  how much to take how to take this when to take this additional instructions   triamcinolone cream 0.1 % Commonly known as: KENALOG APPLY TO THE LEFT LEG UNDER THE WRAP DAILY WHERE THERE IS ITCHING OR RASH X 2 WEEKS OR UNTIL HEALED   vitamin C 1000 MG tablet Take 1,000 mg by mouth daily.   Vitamin D3 125 MCG (5000 UT) Tabs Take 10,000 Units by mouth daily.   zinc gluconate 50 MG tablet Take 50 mg by mouth daily.               Discharge Care Instructions  (From admission, onward)           Start     Ordered   01/16/23 0000  Change dressing on IV access line weekly and PRN  (Home infusion instructions - [facility] )        01/16/23 0735            Diagnostic Studies: PERIPHERAL VASCULAR  CATHETERIZATION  Result Date: 01/15/2023 See surgical note for result.   Patient benefited maximally from their hospital stay and there were no complications.     Disposition: Discharge disposition: 01-Home or Self Care      Discharge Instructions     [facility] pharmacist to adjust dose for Vancomycin, Aminoglycosides and other anti-infective therapies as requested by physician.   Complete by: As directed    [facility] to provide Cath Flo 2mg    Complete by: As directed    Administer for PICC line occlusion and as ordered by physician for other access device issues.   Anaphylaxis Kit: Provided to treat any anaphylactic reaction to  the medication being provided to the patient if First Dose or when requested by physician   Complete by: As directed    Epinephrine 1mg /ml vial / amp: Administer 0.3mg  (0.43ml) subcutaneously once for moderate to severe anaphylaxis, nurse to call physician and pharmacy when reaction occurs and call 911 if needed for immediate care   Diphenhydramine 50mg /ml IV vial: Administer 25-50mg  IV/IM PRN for first dose reaction, rash, itching, mild reaction, nurse to call physician and pharmacy when reaction occurs   Sodium Chloride 0.9% NS IV: Administer if needed for hypovolemic blood pressure drop or as ordered by physician after call to physician with anaphylactic reaction   Call MD / Call 911   Complete by: As directed    If you experience chest pain or shortness of breath, CALL 911 and be transported to the hospital emergency room.  If you develope a fever above 101 F, pus (white drainage) or increased drainage or redness at the wound, or calf pain, call your surgeon's office.   Call MD / Call 911   Complete by: As directed    If you experience chest pain or shortness of breath, CALL 911 and be transported to the hospital emergency room.  If you develope a fever above 101 F, pus (white drainage) or increased drainage or redness at the  wound, or calf pain, call your surgeon's office.   Change dressing on IV access line weekly and PRN   Complete by: As directed    Constipation Prevention   Complete by: As directed    Drink plenty of fluids.  Prune juice may be helpful.  You may use a stool softener, such as Colace (over the counter) 100 mg twice a day.  Use MiraLax (over the counter) for constipation as needed.   Constipation Prevention   Complete by: As directed    Drink plenty of fluids.  Prune juice may be helpful.  You may use a stool softener, such as Colace (over the counter) 100 mg twice a day.  Use MiraLax (over the counter) for constipation as needed.   Diet - low sodium heart healthy   Complete by: As directed    Diet - low sodium heart healthy   Complete by: As directed    Flush IV access with Sodium Chloride 0.9% and Heparin 10 units/ml or 100 units/ml   Complete by: As directed    Home infusion instructions - [facility]   Complete by: As directed    Instructions: Flush IV access with Sodium Chloride 0.9% and Heparin 10units/ml or 100units/ml   Change dressing on IV access line: Weekly and PRN   Instructions Cath Flo 2mg : Administer for PICC Line occlusion and as ordered by physician for other access device   [facility] pharmacist to adjust dose for: Vancomycin, Aminoglycosides and other anti-infective therapies as requested by physician   Increase activity slowly as tolerated   Complete by: As directed    Increase activity slowly as tolerated   Complete by: As directed    Method of administration may be changed at the discretion of home infusion pharmacist based upon assessment of the patient and/or caregiver's ability to self-administer the medication ordered   Complete by: As directed    Negative Pressure Wound Therapy - Incisional   Complete by: As directed    Attached to the wound VAC dressing to a Prevena plus portable wound VAC pump for discharge.   Post-operative opioid taper  instructions:   Complete by: As  directed    POST-OPERATIVE OPIOID TAPER INSTRUCTIONS: It is important to wean off of your opioid medication as soon as possible. If you do not need pain medication after your surgery it is ok to stop day one. Opioids include: Codeine, Hydrocodone(Norco, Vicodin), Oxycodone(Percocet, oxycontin) and hydromorphone amongst others.  Long term and even short term use of opiods can cause: Increased pain response Dependence Constipation Depression Respiratory depression And more.  Withdrawal symptoms can include Flu like symptoms Nausea, vomiting And more Techniques to manage these symptoms Hydrate well Eat regular healthy meals Stay active Use relaxation techniques(deep breathing, meditating, yoga) Do Not substitute Alcohol to help with tapering If you have been on opioids for less than two weeks and do not have pain than it is ok to stop all together.  Plan to wean off of opioids This plan should start within one week post op of your joint replacement. Maintain the same interval or time between taking each dose and first decrease the dose.  Cut the total daily intake of opioids by one tablet each day Next start to increase the time between doses. The last dose that should be eliminated is the evening dose.      Post-operative opioid taper instructions:   Complete by: As directed    POST-OPERATIVE OPIOID TAPER INSTRUCTIONS: It is important to wean off of your opioid medication as soon as possible. If you do not need pain medication after your surgery it is ok to stop day one. Opioids include: Codeine, Hydrocodone(Norco, Vicodin), Oxycodone(Percocet, oxycontin) and hydromorphone amongst others.  Long term and even short term use of opiods can cause: Increased pain response Dependence Constipation Depression Respiratory depression And more.  Withdrawal symptoms can include Flu like symptoms Nausea, vomiting And more Techniques to manage these  symptoms Hydrate well Eat regular healthy meals Stay active Use relaxation techniques(deep breathing, meditating, yoga) Do Not substitute Alcohol to help with tapering If you have been on opioids for less than two weeks and do not have pain than it is ok to stop all together.  Plan to wean off of opioids This plan should start within one week post op of your joint replacement. Maintain the same interval or time between taking each dose and first decrease the dose.  Cut the total daily intake of opioids by one tablet each day Next start to increase the time between doses. The last dose that should be eliminated is the evening dose.          Follow-up Information     Nadara Mustard, MD Follow up in 1 week(s).   Specialty: Orthopedic Surgery Contact information: [address] [place] [address] [phone]         [facility] Follow up.   Why: phone [phone]        [facility] Follow up.   Specialty: Home Health Services Contact information: [address] [place] [address] [phone]                  Signed: Nadara Mustard 01/16/2023, 11:19 AM

## 2023-01-16 NOTE — Discharge Summary (Signed)
Discharge Diagnoses:  Principal Problem:   Ankle wound, left, initial encounter Active Problems:   Ankle ulcer, left, with necrosis of muscle (HCC)   Surgeries: Procedure(s): LEFT ANKLE DEBRIDEMENT APPLICATION OF WOUND VAC on 01/12/2023    Consultants:   Discharged Condition: Improved  Hospital Course: [name] MESERVEY is an 82 y.o. male who was admitted 01/12/2023 with a chief complaint of left ankle wound., with a final diagnosis of Left Ankle Wound.  Patient was brought to the operating room on 01/12/2023 and underwent Procedure(s): LEFT ANKLE DEBRIDEMENT APPLICATION OF WOUND VAC.    Patient was given perioperative antibiotics:  Anti-infectives (From admission, onward)    Start     Dose/Rate Route Frequency Ordered Stop   01/16/23 0000  piperacillin-tazobactam (ZOSYN) IVPB        13.5 g Intravenous Every 24 hours 01/16/23 0735 01/31/23 2359   01/12/23 1600  piperacillin-tazobactam (ZOSYN) injection 3.375 g  Status:  Discontinued        3.375 g Intravenous 3 times daily 01/12/23 1429 01/12/23 1441   01/12/23 1530  piperacillin-tazobactam (ZOSYN) IVPB 3.375 g        3.375 g 12.5 mL/hr over 240 Minutes Intravenous Every 8 hours 01/12/23 1441     01/12/23 0900  ceFAZolin (ANCEF) IVPB 2g/100 mL premix        2 g 200 mL/hr over 30 Minutes Intravenous On call to O.R. 01/12/23 1610 01/12/23 1048     .  Patient was given sequential compression devices, early ambulation, and aspirin for DVT prophylaxis.  Recent vital signs: Patient Vitals for the past 24 hrs:  BP Temp Temp src Pulse Resp SpO2  01/16/23 0633 (!) 169/91 97.9 F (36.6 C) Oral 75 -- 98 %  01/15/23 2000 (!) 143/79 98.2 F (36.8 C) Oral 67 -- 97 %  01/15/23 1519 (!) 147/87 98 F (36.7 C) Oral 82 16 99 %  01/15/23 0846 (!) 163/99 98.2 F (36.8 C) Oral 84 17 96 %  .  Recent laboratory studies: No results found.  Discharge Medications:   Allergies as of 01/16/2023       Reactions   Feraheme [ferumoxytol] Swelling,  Other (See Comments)   Swelling of lips and tongue, could not talk 30 minutes after the infusion.   Latex Other (See Comments)   Latex butterfly bandages tore off the skin and caused terrible blisters   Sulfa Antibiotics Hives   Severe itching, blisters   Wound Dressing Adhesive Other (See Comments)   Latex butterfly bandages tore off the skin and caused terrible blisters   Levothyroxine Swelling, Other (See Comments)   Lip swelling   Doxycycline Nausea Only        Medication List     STOP taking these medications    acetaminophen 500 MG tablet Commonly known as: TYLENOL   HYDROcodone-acetaminophen 10-325 MG tablet Commonly known as: NORCO   piperacillin-tazobactam 3.375 (3-0.375) g injection Commonly known as: ZOSYN Replaced by: piperacillin-tazobactam IVPB       TAKE these medications    betamethasone valerate ointment 0.1 % Commonly known as: VALISONE Apply 1 Application topically 2 (two) times daily. What changed: when to take this   clotrimazole-betamethasone cream Commonly known as: LOTRISONE Apply 1 Application topically daily as needed (Rash).   gabapentin 600 MG tablet Commonly known as: Neurontin Take 1/2 to 1 tablet 2 to 3 x /Daily as needed for Chronic Pain   HEP-LOCK FLUSH IV Inject 1 application  into the vein daily.   hydrOXYzine 25  MG tablet Commonly known as: ATARAX TAKE 1 TABLET 3 X /DAY AS NEEDED FOR ANXIETY OR SLEEP What changed: See the new instructions.   naproxen sodium 220 MG tablet Commonly known as: ALEVE Take 220 mg by mouth at bedtime as needed (Muscle pain).   NON FORMULARY Take 2 capsules by mouth every morning. Thyroid Complete   omeprazole 20 MG tablet Commonly known as: PRILOSEC OTC Take 20 mg by mouth daily before breakfast.   oxyCODONE-acetaminophen 10-325 MG tablet Commonly known as: PERCOCET Take 1 tablet by mouth every 8 (eight) hours as needed.   piperacillin-tazobactam IVPB Commonly known as: ZOSYN Inject  13.5 g into the vein daily for 15 days. Indication:  L-ankle wound First Dose: Yes Last Day of Therapy:  01/30/23 Labs - Once weekly:  CBC/D and BMP, Labs - Once weekly: ESR and CRP Method of administration: Elastomeric (Continuous infusion) Method of administration may be changed at the discretion of home infusion pharmacist based upon assessment of the patient and/or caregiver's ability to self-administer the medication ordered. Replaces: piperacillin-tazobactam 3.375 (3-0.375) g injection   polyethylene glycol 17 g packet Commonly known as: MIRALAX / GLYCOLAX Take 17 g by mouth daily.   RED YEAST RICE PO Take 4,560 mg by mouth daily. 1140 mg each   senna-docusate 8.6-50 MG tablet Commonly known as: Senokot-S Take 2 tablets by mouth 2 (two) times daily. What changed:  how much to take when to take this reasons to take this   silver sulfADIAZINE 1 % cream Commonly known as: SILVADENE Apply 1 Application topically daily. Apply to affected area daily plus dry dressing   sodium chloride 0.9 % infusion Inject into the vein.   torsemide 20 MG tablet Commonly known as: DEMADEX Take  1 tablet  Daily for Fluid Retention / Leg Swelling What changed:  how much to take how to take this when to take this additional instructions   triamcinolone cream 0.1 % Commonly known as: KENALOG APPLY TO THE LEFT LEG UNDER THE WRAP DAILY WHERE THERE IS ITCHING OR RASH X 2 WEEKS OR UNTIL HEALED   vitamin C 1000 MG tablet Take 1,000 mg by mouth daily.   Vitamin D3 125 MCG (5000 UT) Tabs Take 10,000 Units by mouth daily.   zinc gluconate 50 MG tablet Take 50 mg by mouth daily.               Discharge Care Instructions  (From admission, onward)           Start     Ordered   01/16/23 0000  Change dressing on IV access line weekly and PRN  (Home infusion instructions - [facility] )        01/16/23 0735            Diagnostic Studies: PERIPHERAL VASCULAR  CATHETERIZATION  Result Date: 01/15/2023 See surgical note for result.   Patient benefited maximally from their hospital stay and there were no complications.     Disposition: Discharge disposition: 01-Home or Self Care      Discharge Instructions     [facility] pharmacist to adjust dose for Vancomycin, Aminoglycosides and other anti-infective therapies as requested by physician.   Complete by: As directed    [facility] to provide Cath Flo 2mg    Complete by: As directed    Administer for PICC line occlusion and as ordered by physician for other access device issues.   Anaphylaxis Kit: Provided to treat any anaphylactic reaction to  the medication being provided to the patient if First Dose or when requested by physician   Complete by: As directed    Epinephrine 1mg /ml vial / amp: Administer 0.3mg  (0.50ml) subcutaneously once for moderate to severe anaphylaxis, nurse to call physician and pharmacy when reaction occurs and call 911 if needed for immediate care   Diphenhydramine 50mg /ml IV vial: Administer 25-50mg  IV/IM PRN for first dose reaction, rash, itching, mild reaction, nurse to call physician and pharmacy when reaction occurs   Sodium Chloride 0.9% NS IV: Administer if needed for hypovolemic blood pressure drop or as ordered by physician after call to physician with anaphylactic reaction   Call MD / Call 911   Complete by: As directed    If you experience chest pain or shortness of breath, CALL 911 and be transported to the hospital emergency room.  If you develope a fever above 101 F, pus (white drainage) or increased drainage or redness at the wound, or calf pain, call your surgeon's office.   Change dressing on IV access line weekly and PRN   Complete by: As directed    Constipation Prevention   Complete by: As directed    Drink plenty of fluids.  Prune juice may be helpful.  You may use a stool softener, such as Colace (over the counter) 100 mg  twice a day.  Use MiraLax (over the counter) for constipation as needed.   Diet - low sodium heart healthy   Complete by: As directed    Flush IV access with Sodium Chloride 0.9% and Heparin 10 units/ml or 100 units/ml   Complete by: As directed    Home infusion instructions - [facility]   Complete by: As directed    Instructions: Flush IV access with Sodium Chloride 0.9% and Heparin 10units/ml or 100units/ml   Change dressing on IV access line: Weekly and PRN   Instructions Cath Flo 2mg : Administer for PICC Line occlusion and as ordered by physician for other access device   [facility] pharmacist to adjust dose for: Vancomycin, Aminoglycosides and other anti-infective therapies as requested by physician   Increase activity slowly as tolerated   Complete by: As directed    Method of administration may be changed at the discretion of home infusion pharmacist based upon assessment of the patient and/or caregiver's ability to self-administer the medication ordered   Complete by: As directed    Negative Pressure Wound Therapy - Incisional   Complete by: As directed    Attached to the wound VAC dressing to a Prevena plus portable wound VAC pump for discharge.   Post-operative opioid taper instructions:   Complete by: As directed    POST-OPERATIVE OPIOID TAPER INSTRUCTIONS: It is important to wean off of your opioid medication as soon as possible. If you do not need pain medication after your surgery it is ok to stop day one. Opioids include: Codeine, Hydrocodone(Norco, Vicodin), Oxycodone(Percocet, oxycontin) and hydromorphone amongst others.  Long term and even short term use of opiods can cause: Increased pain response Dependence Constipation Depression Respiratory depression And more.  Withdrawal symptoms can include Flu like symptoms Nausea, vomiting And more Techniques to manage these symptoms Hydrate well Eat regular healthy meals Stay active Use  relaxation techniques(deep breathing, meditating, yoga) Do Not substitute Alcohol to help with tapering If you have been on opioids for less than two weeks and do not have pain than it is ok to stop all together.  Plan to wean off of opioids  This plan should start within one week post op of your joint replacement. Maintain the same interval or time between taking each dose and first decrease the dose.  Cut the total daily intake of opioids by one tablet each day Next start to increase the time between doses. The last dose that should be eliminated is the evening dose.          Follow-up Information     Nadara Mustard, MD Follow up in 1 week(s).   Specialty: Orthopedic Surgery Contact information: [address] [place] [address] [phone]         [facility] Follow up.   Why: phone [phone]                 Signed: Nadara Mustard 01/16/2023, 7:36 AM

## 2023-01-16 NOTE — TOC Progression Note (Addendum)
Transition of Care (TOC) - Progression Note   Patient fro discharge today.   [name] will need updated HHRN and face to face , secure chatted Dr Lajoyce Corners and asked to sign.   [name] with [employer] will provide education to wife this afternoon at hospital , secure chatted nurse   Kandee Keen and [name] have orders and OPAT and are ready for discharge . 2pm dose to be given at hospital , nurse aware  Patient Details  Name: [name] Fernandez MRN: [id number] Date of Birth: November 25, 1940  Transition of Care [name] CM/SW Contact  , Adria Devon, RN Phone Number: 01/16/2023, 10:16 AM  Clinical Narrative:       Expected Discharge Plan: Home w Home Health Services Barriers to Discharge: Continued Medical Work up (cultures pending)  Expected Discharge Plan and Services In-house Referral: NA Discharge Planning Services: CM Consult Post Acute Care Choice: Home Health Living arrangements for the past 2 months: Single Family Home Expected Discharge Date: 01/16/23               DME Arranged: N/A         HH Arranged: RN HH Agency: [facility] (will need orders and new OPAT) Date HH Agency Contacted: 01/15/23 Time HH Agency Contacted: 1048 Representative spoke with at [facility] Agency: Kandee Keen   Social Determinants of Health (SDOH) Interventions SDOH Screenings   Food Insecurity: No Food Insecurity (01/12/2023)  Housing: Low Risk  (01/12/2023)  Transportation Needs: No Transportation Needs (01/12/2023)  Utilities: Not At Risk (01/12/2023)  Depression (PHQ2-9): Low Risk  (11/15/2022)  Tobacco Use: Medium Risk (01/12/2023)    Readmission Risk Interventions    06/20/2022   11:21 AM 06/19/2022    2:50 PM  Readmission Risk Prevention Plan  Transportation Screening Complete Complete  PCP or Specialist Appt within 5-7 Days Complete Complete  Home Care Screening Complete Complete  Medication Review (RN CM) Complete Complete

## 2023-01-16 NOTE — Care Management Important Message (Signed)
Important Message  Patient Details  Name: [name] Fernandez MRN: [id number] Date of Birth: 1940-09-11   Medicare Important Message Given:  Yes     Hester, Sunshine 01/16/2023, 4:27 PM

## 2023-01-16 NOTE — Progress Notes (Signed)
Pt discharged home in stable condition 

## 2023-01-17 DIAGNOSIS — L97329 Non-pressure chronic ulcer of left ankle with unspecified severity: Secondary | ICD-10-CM | POA: Diagnosis not present

## 2023-01-17 DIAGNOSIS — D631 Anemia in chronic kidney disease: Secondary | ICD-10-CM | POA: Diagnosis not present

## 2023-01-17 DIAGNOSIS — E1122 Type 2 diabetes mellitus with diabetic chronic kidney disease: Secondary | ICD-10-CM | POA: Diagnosis not present

## 2023-01-17 DIAGNOSIS — M65172 Other infective (teno)synovitis, left ankle and foot: Secondary | ICD-10-CM | POA: Diagnosis not present

## 2023-01-17 DIAGNOSIS — E11622 Type 2 diabetes mellitus with other skin ulcer: Secondary | ICD-10-CM | POA: Diagnosis not present

## 2023-01-17 DIAGNOSIS — B965 Pseudomonas (aeruginosa) (mallei) (pseudomallei) as the cause of diseases classified elsewhere: Secondary | ICD-10-CM | POA: Diagnosis not present

## 2023-01-17 DIAGNOSIS — N182 Chronic kidney disease, stage 2 (mild): Secondary | ICD-10-CM | POA: Diagnosis not present

## 2023-01-17 DIAGNOSIS — I129 Hypertensive chronic kidney disease with stage 1 through stage 4 chronic kidney disease, or unspecified chronic kidney disease: Secondary | ICD-10-CM | POA: Diagnosis not present

## 2023-01-17 DIAGNOSIS — Z48817 Encounter for surgical aftercare following surgery on the skin and subcutaneous tissue: Secondary | ICD-10-CM | POA: Diagnosis not present

## 2023-01-22 DIAGNOSIS — I129 Hypertensive chronic kidney disease with stage 1 through stage 4 chronic kidney disease, or unspecified chronic kidney disease: Secondary | ICD-10-CM | POA: Diagnosis not present

## 2023-01-22 DIAGNOSIS — B965 Pseudomonas (aeruginosa) (mallei) (pseudomallei) as the cause of diseases classified elsewhere: Secondary | ICD-10-CM | POA: Diagnosis not present

## 2023-01-22 DIAGNOSIS — L97329 Non-pressure chronic ulcer of left ankle with unspecified severity: Secondary | ICD-10-CM | POA: Diagnosis not present

## 2023-01-22 DIAGNOSIS — Z48817 Encounter for surgical aftercare following surgery on the skin and subcutaneous tissue: Secondary | ICD-10-CM | POA: Diagnosis not present

## 2023-01-22 DIAGNOSIS — D631 Anemia in chronic kidney disease: Secondary | ICD-10-CM | POA: Diagnosis not present

## 2023-01-22 DIAGNOSIS — Z5181 Encounter for therapeutic drug level monitoring: Secondary | ICD-10-CM | POA: Diagnosis not present

## 2023-01-22 DIAGNOSIS — E1122 Type 2 diabetes mellitus with diabetic chronic kidney disease: Secondary | ICD-10-CM | POA: Diagnosis not present

## 2023-01-22 DIAGNOSIS — E11622 Type 2 diabetes mellitus with other skin ulcer: Secondary | ICD-10-CM | POA: Diagnosis not present

## 2023-01-22 DIAGNOSIS — M65172 Other infective (teno)synovitis, left ankle and foot: Secondary | ICD-10-CM | POA: Diagnosis not present

## 2023-01-22 DIAGNOSIS — N182 Chronic kidney disease, stage 2 (mild): Secondary | ICD-10-CM | POA: Diagnosis not present

## 2023-01-22 DIAGNOSIS — Z792 Long term (current) use of antibiotics: Secondary | ICD-10-CM | POA: Diagnosis not present

## 2023-01-23 ENCOUNTER — Encounter: Payer: Self-pay | Admitting: Orthopedic Surgery

## 2023-01-23 ENCOUNTER — Ambulatory Visit: Payer: Medicare HMO | Admitting: Orthopedic Surgery

## 2023-01-23 DIAGNOSIS — L97322 Non-pressure chronic ulcer of left ankle with fat layer exposed: Secondary | ICD-10-CM

## 2023-01-23 NOTE — Progress Notes (Signed)
Patient is status post debridement chronic lateral left ankle wound.  He filled up 2-1/2 canisters with serosanguineous fluid.  Examination the wound edges there is no maceration no cellulitis.  He has full weightbearing.  The wound is flat with healthy granulation tissue.  Measures 7 x 12 cm.  Patient will start with Dial soap cleansing Silvadene dressing changes follow-up in 2 weeks.

## 2023-01-25 ENCOUNTER — Ambulatory Visit (INDEPENDENT_AMBULATORY_CARE_PROVIDER_SITE_OTHER): Payer: Medicare HMO | Admitting: Internal Medicine

## 2023-01-25 ENCOUNTER — Other Ambulatory Visit: Payer: Self-pay

## 2023-01-25 ENCOUNTER — Telehealth: Payer: Self-pay

## 2023-01-25 ENCOUNTER — Encounter: Payer: Self-pay | Admitting: Internal Medicine

## 2023-01-25 VITALS — Wt 170.0 lb

## 2023-01-25 DIAGNOSIS — S91002A Unspecified open wound, left ankle, initial encounter: Secondary | ICD-10-CM

## 2023-01-25 DIAGNOSIS — L089 Local infection of the skin and subcutaneous tissue, unspecified: Secondary | ICD-10-CM | POA: Diagnosis not present

## 2023-01-25 MED ORDER — MEDIHONEY WOUND/BURN DRESSING EX PSTE: 1.0000 | PASTE | Freq: Every day | CUTANEOUS | 1 refills | Status: DC

## 2023-01-25 NOTE — Progress Notes (Signed)
RFV: follow up on deep wound infection to lateral left ankle Patient ID: [name] Fernandez, male   DOB: 20-Aug-1940, 82 y.o.   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 His cultures in the past grew  initially PsA in the January then achromabacter in February, then in  may 2024. enterococcus, Psa, and fingoldia. He was on cefepime initially then changed to piptazo to treat all the pathogens. He had small nidus of necrotic tendon where he underwent I x D on 8/2 -with the size of wound is 7 by 12 cm by wound vac. We extended abtx through 8/20. Cultures are negative from OR. He appears to be doing well. He had wound vac removed and doing local wound care using medihoney. Has had tx for 10 wk. Inflammatory markers are normalized. Outpatient Encounter Medications as of 01/25/2023  Medication Sig   Ascorbic Acid (VITAMIN C) 1000 MG tablet Take 1,000 mg by mouth daily.   betamethasone valerate ointment (VALISONE) 0.1 % Apply 1 Application topically 2 (two) times daily. (Patient taking differently: Apply 1 Application topically daily.)   Cholecalciferol (VITAMIN D3) 125 MCG (5000 UT) TABS Take 10,000 Units by mouth daily.   clotrimazole-betamethasone (LOTRISONE) cream Apply 1 Application topically daily as needed (Rash).   gabapentin (NEURONTIN) 600 MG tablet Take 1/2 to 1 tablet 2 to 3 x /Daily as needed for Chronic Pain   Heparin Sod, Pork, Lock Flush (HEP-LOCK FLUSH IV) Inject 1 application  into the vein daily.   hydrOXYzine (ATARAX) 25 MG tablet TAKE 1 TABLET 3 X /DAY AS NEEDED FOR ANXIETY OR SLEEP (Patient taking differently: Take 50 mg by mouth at bedtime. Take  1 tablet  3 x /day  as needed  for Anxiety or Sleep)   naproxen sodium (ALEVE) 220 MG tablet Take 220 mg by mouth at bedtime as needed (Muscle pain).   NON FORMULARY Take 2 capsules by mouth every morning. Thyroid Complete   omeprazole (PRILOSEC OTC) 20 MG tablet Take 20 mg by mouth daily before  breakfast.   oxyCODONE-acetaminophen (PERCOCET) 10-325 MG tablet Take 1 tablet by mouth every 8 (eight) hours as needed.   piperacillin-tazobactam (ZOSYN) IVPB Inject 4.5 g into the vein every 8 (eight) hours for 14 days. Indication:  L-ankle wound/osteo First Dose: Yes Last Day of Therapy:  01/30/23 Labs - Once weekly:  CBC/D and BMP, Labs - Once weekly: ESR and CRP Method of administration: Intermittent requested by family Method of administration may be changed at the discretion of home infusion pharmacist based upon assessment of the patient and/or caregiver's ability to self-administer the medication ordered.   polyethylene glycol (MIRALAX / GLYCOLAX) 17 g packet Take 17 g by mouth daily. (Patient not taking: Reported on 01/10/2023)   Red Yeast Rice Extract (RED YEAST RICE PO) Take 4,560 mg by mouth daily. 1140 mg each   senna-docusate (SENOKOT-S) 8.6-50 MG tablet Take 2 tablets by mouth 2 (two) times daily. (Patient taking differently: Take 1-3 tablets by mouth at bedtime as needed for mild constipation or moderate constipation.)   silver sulfADIAZINE (SILVADENE) 1 % cream Apply 1 Application topically daily. Apply to affected area daily plus dry dressing (Patient not taking: Reported on 01/10/2023)   sodium chloride 0.9 % infusion Inject into the vein.   torsemide (DEMADEX) 20 MG tablet Take  1 tablet  Daily for Fluid Retention / Leg Swelling (Patient taking differently: Take 20 mg by mouth every morning. Fluid Retention /  Leg Swelling)   triamcinolone cream (KENALOG) 0.1 % APPLY TO THE LEFT LEG UNDER THE WRAP DAILY WHERE THERE IS ITCHING OR RASH X 2 WEEKS OR UNTIL HEALED   zinc gluconate 50 MG tablet Take 50 mg by mouth daily.   No facility-administered encounter medications on file as of 01/25/2023.     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) 06/16/2022   Osteomyelitis (HCC) 06/16/2022   Tenosynovitis of tibialis posterior tendon 06/15/2022   Wound infection 06/15/2022   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   Anemia 03/13/2019   Chronic venous insufficiency 03/12/2019   CKD (chronic kidney disease) stage 2, 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/2015   COPD (chronic obstructive pulmonary disease) (HCC) 12/08/2015   Chronic pain syndrome 05/26/2015   DDD (degenerative disc disease), lumbar 05/26/2015   Medication management 05/26/2015   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   Lumbago with sciatica 08/06/2014   Hyperlipidemia 08/20/2013   Hypertension    Abnormal glucose    Vitamin D deficiency    Arthritis    Vitamin B12 deficiency    Celiac disease 06/19/2012   GERD (gastroesophageal reflux disease) 04/30/2012   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 tube) procedure 04/30/2012   Hypothyroidism 04/30/2012   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 No diarrhea. 12 point ros is negative Physical Exam   Wt 170 lb (77.1 kg) Comment: pt reports  BMI 27.03 kg/m   Gen = a x o by 3 in NAD Pictures reviewed Skin = c/d/I picc line  CBC Lab Results  Component Value Date   WBC 8.9 01/15/2023   RBC 3.55 (L) 01/15/2023   HGB 11.2 (L) 01/15/2023   HCT 33.0 (L) 01/15/2023   PLT 340 01/15/2023   MCV 93.0 01/15/2023   MCH 31.5 01/15/2023   MCHC 33.9 01/15/2023   RDW 14.3 01/15/2023   LYMPHSABS 2.3 01/15/2023   MONOABS 0.9 01/15/2023   EOSABS 0.8 (H) 01/15/2023    BMET Lab Results  Component Value Date   NA 139 01/15/2023   K 4.0 01/15/2023    CL 102 01/15/2023   CO2 26 01/15/2023   GLUCOSE 96 01/15/2023   BUN 15 01/15/2023   CREATININE 1.20 01/15/2023   CALCIUM 9.1 01/15/2023   GFRNONAA >60 01/15/2023   GFRAA 75 09/28/2020   Lab Results  Component Value Date   ESRSEDRATE 34 (H) 01/15/2023   8/13 sed rate of 8/ CRP 1  Cultures from 5/31: PsA and Enterococcus and finegoldia Susceptibility   Pseudomonas aeruginosa Enterococcus faecalis    MIC MIC    AMPICILLIN   <=2 SENSITIVE Sensitive    CEFEPIME 2 SENSITIVE Sensitive      CEFTAZIDIME <=1 SENSITIVE Sensitive      CIPROFLOXACIN >=4 RESISTANT Resistant      GENTAMICIN 4 SENSITIVE Sensitive      GENTAMICIN SYNERGY   SENSITIVE Sensitive    IMIPENEM 1 SENSITIVE Sensitive  PIP/TAZO <=4 SENSITIVE Sensitive      VANCOMYCIN   1 SENSITIVE Sensitive        Assessment and Plan Left ankle deep wound infection = will observe off of abtx. Has been on treatment since end of may 2024.arrange to do pull picc line and see back in  Rtc in 2-3 wk. To assess wound  Refill medihoney

## 2023-01-25 NOTE — Telephone Encounter (Signed)
Per Dr. Drue Second called [employer] with verbal orders to pull picc today. Relayed orders to Amy, Pharmacist who will relay results to [facility],  Orders repeated and confirmed before ending call. Patient aware.  Juanita Laster, RMA

## 2023-01-26 DIAGNOSIS — E11622 Type 2 diabetes mellitus with other skin ulcer: Secondary | ICD-10-CM | POA: Diagnosis not present

## 2023-01-26 DIAGNOSIS — I872 Venous insufficiency (chronic) (peripheral): Secondary | ICD-10-CM | POA: Diagnosis not present

## 2023-01-26 DIAGNOSIS — D631 Anemia in chronic kidney disease: Secondary | ICD-10-CM | POA: Diagnosis not present

## 2023-01-26 DIAGNOSIS — M65172 Other infective (teno)synovitis, left ankle and foot: Secondary | ICD-10-CM | POA: Diagnosis not present

## 2023-01-26 DIAGNOSIS — L97329 Non-pressure chronic ulcer of left ankle with unspecified severity: Secondary | ICD-10-CM | POA: Diagnosis not present

## 2023-01-26 DIAGNOSIS — I129 Hypertensive chronic kidney disease with stage 1 through stage 4 chronic kidney disease, or unspecified chronic kidney disease: Secondary | ICD-10-CM | POA: Diagnosis not present

## 2023-01-26 DIAGNOSIS — N182 Chronic kidney disease, stage 2 (mild): Secondary | ICD-10-CM | POA: Diagnosis not present

## 2023-01-26 DIAGNOSIS — E1122 Type 2 diabetes mellitus with diabetic chronic kidney disease: Secondary | ICD-10-CM | POA: Diagnosis not present

## 2023-01-26 DIAGNOSIS — B965 Pseudomonas (aeruginosa) (mallei) (pseudomallei) as the cause of diseases classified elsewhere: Secondary | ICD-10-CM | POA: Diagnosis not present

## 2023-01-29 ENCOUNTER — Ambulatory Visit: Payer: Medicare HMO | Admitting: Internal Medicine

## 2023-01-29 DIAGNOSIS — E1122 Type 2 diabetes mellitus with diabetic chronic kidney disease: Secondary | ICD-10-CM | POA: Diagnosis not present

## 2023-01-29 DIAGNOSIS — B965 Pseudomonas (aeruginosa) (mallei) (pseudomallei) as the cause of diseases classified elsewhere: Secondary | ICD-10-CM | POA: Diagnosis not present

## 2023-01-29 DIAGNOSIS — D631 Anemia in chronic kidney disease: Secondary | ICD-10-CM | POA: Diagnosis not present

## 2023-01-29 DIAGNOSIS — L97329 Non-pressure chronic ulcer of left ankle with unspecified severity: Secondary | ICD-10-CM | POA: Diagnosis not present

## 2023-01-29 DIAGNOSIS — M65172 Other infective (teno)synovitis, left ankle and foot: Secondary | ICD-10-CM | POA: Diagnosis not present

## 2023-01-29 DIAGNOSIS — I872 Venous insufficiency (chronic) (peripheral): Secondary | ICD-10-CM | POA: Diagnosis not present

## 2023-01-29 DIAGNOSIS — E11622 Type 2 diabetes mellitus with other skin ulcer: Secondary | ICD-10-CM | POA: Diagnosis not present

## 2023-01-29 DIAGNOSIS — N182 Chronic kidney disease, stage 2 (mild): Secondary | ICD-10-CM | POA: Diagnosis not present

## 2023-01-29 DIAGNOSIS — I129 Hypertensive chronic kidney disease with stage 1 through stage 4 chronic kidney disease, or unspecified chronic kidney disease: Secondary | ICD-10-CM | POA: Diagnosis not present

## 2023-02-06 ENCOUNTER — Ambulatory Visit: Payer: Medicare HMO | Admitting: Infectious Diseases

## 2023-02-07 ENCOUNTER — Telehealth: Payer: Self-pay | Admitting: Family

## 2023-02-07 ENCOUNTER — Ambulatory Visit (INDEPENDENT_AMBULATORY_CARE_PROVIDER_SITE_OTHER): Payer: Medicare HMO | Admitting: Family

## 2023-02-07 ENCOUNTER — Encounter: Payer: Self-pay | Admitting: Family

## 2023-02-07 ENCOUNTER — Ambulatory Visit: Payer: Medicare HMO | Admitting: Infectious Diseases

## 2023-02-07 DIAGNOSIS — L97323 Non-pressure chronic ulcer of left ankle with necrosis of muscle: Secondary | ICD-10-CM

## 2023-02-07 MED ORDER — SANTYL 250 UNIT/GM EX OINT: 1.0000 | TOPICAL_OINTMENT | Freq: Every day | CUTANEOUS | 3 refills | Status: DC

## 2023-02-07 MED ORDER — TRIAMCINOLONE ACETONIDE 0.1 % EX CREA: 1.0000 | TOPICAL_CREAM | Freq: Two times a day (BID) | CUTANEOUS | 0 refills | Status: DC

## 2023-02-07 NOTE — Telephone Encounter (Signed)
Erin sent in 7 tubes for santyl

## 2023-02-07 NOTE — Progress Notes (Signed)
Office Visit Note   Patient: [name] Fernandez           Date of Birth: 1941-01-30           MRN: [id number] Visit Date: 02/07/2023              Requested by: Lucky Cowboy, MD [address] [place],  [place] [address] PCP: Lucky Cowboy, MD  Chief Complaint  Patient presents with   Left Ankle - Routine Post Op    01/12/23 left ankle deb      HPI: The patient is an 82 year old gentleman status post debridement of chronic lateral left ankle wound.  He has been doing dry dressing changes for the last 1 week.  He does use a very thin amount of Santyl areas of fibrinous tissue.  Assessment & Plan: Visit Diagnoses: No diagnosis found.  Plan: He will continue with his current wound care daily Dial soap cleansing.   may use a thin layer of Santyl to areas of fibrinous exudative tissue.  Discussed elevation.  Follow-Up Instructions: Return in about 15 days (around 02/22/2023).   Ortho Exam  Patient is alert, oriented, no adenopathy, well-dressed, normal affect, normal respiratory effort. On examination the wound edges are without epiboly.  There is no maceration or surrounding cellulitis or erythema.  He does have scant fibrinous exudative tissue to the anterior and posterior aspects of the wound.  No active drainage  Imaging: No results found.   Labs: Lab Results  Component Value Date   HGBA1C 6.3 (H) 10/05/2022   HGBA1C 6.4 (H) 04/04/2022   HGBA1C 5.9 (H) 09/14/2021   ESRSEDRATE 34 (H) 01/15/2023   ESRSEDRATE 32 (H) 06/15/2022   ESRSEDRATE 40 (H) 06/14/2022   CRP 0.7 01/15/2023   CRP 0.8 06/15/2022   CRP <0.5 06/14/2022   LABURIC 7.8 05/20/2014   LABURIC 6.8 05/20/2013   REPTSTATUS 01/17/2023 FINAL 01/12/2023   GRAMSTAIN NO WBC SEEN NO ORGANISMS SEEN  01/12/2023   CULT  01/12/2023    No growth aerobically or anaerobically. Performed at [facility], [address], [place], [place] [address]    LABORGA PSEUDOMONAS AERUGINOSA 11/10/2022    LABORGA ENTEROCOCCUS FAECALIS 11/10/2022     Lab Results  Component Value Date   ALBUMIN 2.9 (L) 06/16/2022   ALBUMIN 3.8 06/14/2022   ALBUMIN 4.0 09/25/2016   PREALBUMIN 18 06/14/2022    Lab Results  Component Value Date   MG 1.8 06/19/2022   MG 1.9 04/04/2022   MG 1.9 03/16/2021   Lab Results  Component Value Date   VD25OH 57 10/05/2022   VD25OH 59 04/04/2022   VD25OH 44 09/28/2020    Lab Results  Component Value Date   PREALBUMIN 18 06/14/2022      Latest Ref Rng & Units 01/15/2023    4:53 PM 01/12/2023    8:56 AM 11/10/2022    9:56 AM  CBC EXTENDED  WBC 4.0 - 10.5 K/uL 8.9  8.1  14.6   RBC 4.22 - 5.81 MIL/uL 3.55  3.59  3.92   Hemoglobin 13.0 - 17.0 g/dL 62.1  30.8  65.7   HCT 39.0 - 52.0 % 33.0  33.5  36.3   Platelets 150 - 400 K/uL 340  356  371   NEUT# 1.7 - 7.7 K/uL 4.8     Lymph# 0.7 - 4.0 K/uL 2.3        There is no height or weight on file to calculate BMI.  Orders:  No orders of the defined types were placed in this encounter.  Meds ordered this encounter  Medications   collagenase (SANTYL) 250 UNIT/GM ointment    Sig: Apply 1 Application topically daily.    Dispense:  90 g    Refill:  3     Procedures: No procedures performed  Clinical Data: No additional findings.  ROS:  All other systems negative, except as noted in the HPI. Review of Systems  Objective: Vital Signs: There were no vitals taken for this visit.  Specialty Comments:  No specialty comments available.  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) 06/16/2022   Osteomyelitis (HCC) 06/16/2022   Tenosynovitis of tibialis posterior tendon 06/15/2022   Wound infection 06/15/2022   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   Anemia 03/13/2019   Chronic venous insufficiency  03/12/2019   CKD (chronic kidney disease) stage 2, 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/2015   COPD (chronic obstructive pulmonary disease) (HCC) 12/08/2015   Chronic pain syndrome 05/26/2015   DDD (degenerative disc disease), lumbar 05/26/2015   Medication management 05/26/2015   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   Lumbago with sciatica 08/06/2014   Hyperlipidemia 08/20/2013   Hypertension    Abnormal glucose    Vitamin D deficiency    Arthritis    Vitamin B12 deficiency    Celiac disease 06/19/2012   GERD (gastroesophageal reflux disease) 04/30/2012   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 tube) procedure 04/30/2012   Hypothyroidism 04/30/2012   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   Past Medical History:  Diagnosis Date   Anemia    Celiac disease    diagnoses 06/24/12   Chronic ulcer of ankle (HCC)    left   Esophageal stricture    GERD (gastroesophageal reflux disease)    Hiatal hernia    Hypertension    patient states " no current HTN"   Hypothyroidism    Pre-diabetes    Prediabetes    Shortness of breath    from oxycodone at times   Vitamin B12 deficiency    Vitamin D deficiency     Family History  Problem Relation Age of Onset   Esophageal cancer Father     Past Surgical History:  Procedure Laterality Date   APPENDECTOMY     APPLICATION OF WOUND VAC Left 01/12/2023   Procedure: APPLICATION OF WOUND VAC;  Surgeon: Nadara Mustard, MD;  Location: [facility] OR;  Service: Orthopedics;  Laterality: Left;   BACK SURGERY     3 diff surgeries for vertebrea broken   EYE SURGERY     bilateral cataract surgery   HERNIA REPAIR  02/10/1949   bilateral groin as child   HIATAL HERNIA REPAIR     I & D EXTREMITY Left 06/16/2022   Procedure: IRRIGATION AND DEBRIDEMENT ANKLE;  Surgeon: Nadara Mustard, MD;  Location[facility] OR;  Service: Orthopedics;  Laterality: Left;   I & D EXTREMITY Left 07/21/2022   Procedure: LEFT ANKLE DEBRIDEMENT;  Surgeon: Nadara Mustard, MD;  Location: [facility] OR;  Service:  Orthopedics;  Laterality: Left;   I & D EXTREMITY Left 11/10/2022   Procedure: LEFT ANKLE DEBRIDEMENT;  Surgeon: Nadara Mustard, MD;  Location: [facility] OR;  Service: Orthopedics;  Laterality: Left;   I & D EXTREMITY Left 01/12/2023   Procedure: LEFT ANKLE  DEBRIDEMENT;  Surgeon: Nadara Mustard, MD;  Location: [facility] OR;  Service: Orthopedics;  Laterality: Left;   JOINT REPLACEMENT  04/12/2010   right knee replacement   TONSILLECTOMY     as child   TOTAL KNEE REVISION  11/20/2011   Procedure: TOTAL KNEE REVISION;  Surgeon: Shelda Pal, MD;  Location: [facility] ORS;  Service: Orthopedics;  Laterality: Right;  Right Total Knee Revision   Social History   Occupational History   Occupation: Retired    Associate Professor: [employer]  Tobacco Use   Smoking status: Former    Current packs/day: 0.00    Average packs/day: 1 pack/day for 20.0 years (20.0 ttl pk-yrs)    Types: Cigarettes    Start date: 06/27/1960    Quit date: 06/27/1980    Years since quitting: 42.6    Passive exposure: Past   Smokeless tobacco: Never  Vaping Use   Vaping status: Never Used  Substance and Sexual Activity   Alcohol use: No   Drug use: No   Sexual activity: Yes

## 2023-02-07 NOTE — Addendum Note (Signed)
Addended by: Adonis Huguenin on: 02/07/2023 04:34 PM   Modules accepted: Orders

## 2023-02-07 NOTE — Telephone Encounter (Signed)
Erica from [employer] called. Need wound dimensions for cream to be billed to insurance. [phone] is her number. [phone] is store number

## 2023-02-07 NOTE — Telephone Encounter (Signed)
Given wound dimensions to [facility] store locally.  [name] Fernandez is from the specialty pharmacy. They did put dimensions in note of 3cm X 5cm and any [facility] team can see the notes. She is not sure they can fill at this local store and will have to come from specialty pharmacy.

## 2023-02-07 NOTE — Telephone Encounter (Signed)
Patient called advised the Melburn Popper is going to cost him a $95.00 co-pay. Patient said he would like the 7 tubes sent in instead. Patient asked if [place] would call him back. The number to contact patient is [phone]

## 2023-02-08 ENCOUNTER — Encounter: Payer: Self-pay | Admitting: Infectious Diseases

## 2023-02-08 ENCOUNTER — Other Ambulatory Visit: Payer: Self-pay | Admitting: Family

## 2023-02-08 ENCOUNTER — Ambulatory Visit (INDEPENDENT_AMBULATORY_CARE_PROVIDER_SITE_OTHER): Payer: Medicare HMO | Admitting: Infectious Diseases

## 2023-02-08 ENCOUNTER — Other Ambulatory Visit: Payer: Self-pay

## 2023-02-08 VITALS — BP 110/70 | HR 97 | Resp 16 | Ht 66.6 in | Wt 170.0 lb

## 2023-02-08 DIAGNOSIS — L97323 Non-pressure chronic ulcer of left ankle with necrosis of muscle: Secondary | ICD-10-CM

## 2023-02-08 DIAGNOSIS — R21 Rash and other nonspecific skin eruption: Secondary | ICD-10-CM | POA: Diagnosis not present

## 2023-02-08 DIAGNOSIS — M86672 Other chronic osteomyelitis, left ankle and foot: Secondary | ICD-10-CM | POA: Diagnosis not present

## 2023-02-08 MED ORDER — PREDNISONE 20 MG PO TABS: ORAL_TABLET | ORAL | 0 refills | Status: AC

## 2023-02-08 MED ORDER — SANTYL 250 UNIT/GM EX OINT: 1.0000 | TOPICAL_OINTMENT | Freq: Every day | CUTANEOUS | 3 refills | Status: DC

## 2023-02-08 NOTE — Progress Notes (Signed)
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 male with chronic deep left ankle wound infection - s/p debridement cultures with FQ resistant pseudomonas and Enterococcus Faecalis   Following with Dr. Lajoyce Corners - 8/13 - no maceration, no cellulitis, full weight bearing status. FU  FU with Denny Peon, NP 8/28 - using a thin amount of Santyl, off antibiotics. Daily wound care with dial soap cleansing. Still with swelling - elevation of limb discusse.  He has had trouble with this wound for well over a year and it continues to get larger the more he has surgery. It started as 3 smaller ulcers and now has coalesced into one large one. This is his 4th surgery he has required for treatment of the wound. He has had good result with wound vac but increased slough since removal of that.  He has required IV cefepime for treatment a few times this year - January required distal fibula resection (Quinolone resistant pseudomonas).  Superficial debridement 2/9 with achromobacter s/p bactrim tx. He recalls a diffuse rash at that time with that antibiotic  June 5th had PICC line and IV cefepime again for concern over left ankle osteomyelitis --> I&D again 5/31 with Dr. Lajoyce Corners after wound care center referred or surgical management. Again with FQ resistant PsA growing and another 6 weeks of IV cefepime in addition to e faecalis and finegoldia.  Completed a course of zosyn through 01/16/2023 for this.   They have had discussions about skin graft of the large wound but nothing final / firm on timing or benefit.   Review of Systems  Constitutional:  Positive for weight loss. Negative for chills and fever.  Skin:  Positive for itching and rash.    Past Medical History:  Diagnosis Date   Anemia    Celiac disease    diagnoses 06/24/12   Chronic ulcer of  ankle (HCC)    left   Esophageal stricture    GERD (gastroesophageal reflux disease)    Hiatal hernia    Hypertension    patient states " no current HTN"   Hypothyroidism    Pre-diabetes    Prediabetes    Shortness of breath    from oxycodone at times   Vitamin B12 deficiency    Vitamin D deficiency     Outpatient Medications Prior to Visit  Medication Sig Dispense Refill   betamethasone valerate ointment (VALISONE) 0.1 % Apply 1 Application topically 2 (two) times daily. (Patient taking differently: Apply 1 Application topically daily.) 30 g 2   clotrimazole-betamethasone (LOTRISONE) cream Apply 1 Application topically daily as needed (Rash).     collagenase (SANTYL) 250 UNIT/GM ointment Apply 1 Application topically daily. 7x 30 g tubes 210 g 3   Heparin Sod, Pork, Lock Flush (HEP-LOCK FLUSH IV) Inject 1 application  into the vein daily.     hydrOXYzine (ATARAX) 25 MG tablet TAKE 1 TABLET 3 X /DAY AS NEEDED FOR ANXIETY OR SLEEP (Patient taking differently: Take 50 mg by mouth at bedtime. Take  1 tablet  3 x /day  as needed  for Anxiety or Sleep) 270 tablet 1   leptospermum manuka honey (MEDIHONEY) PSTE paste Apply 1 Application topically daily. 44 mL 1   omeprazole (PRILOSEC OTC) 20 MG tablet Take 20 mg by mouth daily before breakfast.  oxyCODONE-acetaminophen (PERCOCET) 10-325 MG tablet Take 1 tablet by mouth every 8 (eight) hours as needed. 30 tablet 0   polyethylene glycol (MIRALAX / GLYCOLAX) 17 g packet Take 17 g by mouth daily. 14 each 0   senna-docusate (SENOKOT-S) 8.6-50 MG tablet Take 2 tablets by mouth 2 (two) times daily. 30 tablet 0   sodium chloride 0.9 % infusion Inject into the vein.     torsemide (DEMADEX) 20 MG tablet Take  1 tablet  Daily for Fluid Retention / Leg Swelling (Patient taking differently: Take 20 mg by mouth every morning. Fluid Retention / Leg Swelling) 90 tablet 3   triamcinolone cream (KENALOG) 0.1 % APPLY TO THE LEFT LEG UNDER THE WRAP DAILY WHERE  THERE IS ITCHING OR RASH X 2 WEEKS OR UNTIL HEALED 454 g 2   triamcinolone cream (KENALOG) 0.1 % Apply 1 Application topically 2 (two) times daily. 225 each 0   Ascorbic Acid (VITAMIN C) 1000 MG tablet Take 1,000 mg by mouth daily. (Patient not taking: Reported on 02/08/2023)     Cholecalciferol (VITAMIN D3) 125 MCG (5000 UT) TABS Take 10,000 Units by mouth daily. (Patient not taking: Reported on 02/08/2023)     gabapentin (NEURONTIN) 600 MG tablet Take 1/2 to 1 tablet 2 to 3 x /Daily as needed for Chronic Pain (Patient not taking: Reported on 02/08/2023) 270 tablet 1   NON FORMULARY Take 2 capsules by mouth every morning. Thyroid Complete (Patient not taking: Reported on 02/08/2023)     Red Yeast Rice Extract (RED YEAST RICE PO) Take 4,560 mg by mouth daily. 1140 mg each (Patient not taking: Reported on 02/08/2023)     silver sulfADIAZINE (SILVADENE) 1 % cream Apply 1 Application topically daily. Apply to affected area daily plus dry dressing (Patient not taking: Reported on 02/08/2023) 400 g 3   zinc gluconate 50 MG tablet Take 50 mg by mouth daily. (Patient not taking: Reported on 02/08/2023)     naproxen sodium (ALEVE) 220 MG tablet Take 220 mg by mouth at bedtime as needed (Muscle pain). (Patient not taking: Reported on 02/08/2023)     No facility-administered medications prior to visit.     Allergies  Allergen Reactions   Feraheme [Ferumoxytol] Swelling and Other (See Comments)    Swelling of lips and tongue, could not talk 30 minutes after the infusion.   Latex Other (See Comments)    Latex butterfly bandages tore off the skin and caused terrible blisters   Sulfa Antibiotics Hives    Severe itching, blisters   Wound Dressing Adhesive Other (See Comments)    Latex butterfly bandages tore off the skin and caused terrible blisters   Levothyroxine Swelling and Other (See Comments)    Lip swelling   Doxycycline Nausea Only    Social History   Tobacco Use   Smoking status: Former    Current  packs/day: 0.00    Average packs/day: 1 pack/day for 20.0 years (20.0 ttl pk-yrs)    Types: Cigarettes    Start date: 06/27/1960    Quit date: 06/27/1980    Years since quitting: 42.6    Passive exposure: Past   Smokeless tobacco: Never  Vaping Use   Vaping status: Never Used  Substance Use Topics   Alcohol use: No   Drug use: No    Family History  Problem Relation Age of Onset   Esophageal cancer Father     Objective:   Vitals:   02/08/23 1428  BP: 110/70  Pulse: 97  Resp: 16  Weight: 170 lb (77.1 kg)  Height: 5' 6.6" (1.692 m)   Body mass index is 26.95 kg/m.  Physical Exam Constitutional:      Appearance: Normal appearance.  Cardiovascular:     Rate and Rhythm: Normal rate and regular rhythm.  Skin:    Comments: Has a few red papules on the left lower forearm. Not significantly raised. Skin appears well hydrated.   Neurological:     Mental Status: He is alert.      Photo from 02/07/2023      Lab Results: Lab Results  Component Value Date   WBC 8.9 01/15/2023   HGB 11.2 (L) 01/15/2023   HCT 33.0 (L) 01/15/2023   MCV 93.0 01/15/2023   PLT 340 01/15/2023    Lab Results  Component Value Date   CREATININE 1.20 01/15/2023   BUN 15 01/15/2023   NA 139 01/15/2023   K 4.0 01/15/2023   CL 102 01/15/2023   CO2 26 01/15/2023    Lab Results  Component Value Date   ALT 22 10/05/2022   AST 24 10/05/2022   ALKPHOS 69 06/16/2022   BILITOT 0.9 10/05/2022     Assessment & Plan:   Problem List Items Addressed This Visit       Unprioritized   Ankle ulcer, left, with necrosis of muscle (HCC)    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 Comparing the wound to 2 weeks ago when vac came off there seems to be increased edema around the wound with some slough at a few  boarders. Discussed role of compression therapy to help decongest the skin to allow the wound to heal. Also discussed he will likely benefit from graft application to help prevent future infections of chronic open wound. Advised leg elevation, adequate protein intake (he has lost weight during the care required for the infections he has had) and compression as he can tolerate.   No signs of infection. Will release him to follow with ortho for ongoing wound care recommendations.  We will remain available to assist with antibiotic treatments if concerns going forward.       Osteomyelitis (HCC)   Rash and nonspecific skin eruption - Primary    I am not clear what to make of his rash / itching. He has had it a few times with use of antibiotics however he has since stopped 3 weeks ago. Normally if we suspect drug rash stopping the offending drug removes the rash. He does not suspect any contact dermatitis problems with any new products at home. He has not had any success with topical and feels this is more diffuse. Had success with oral pred and injection in the past. I can't offer injection today but will be willing to do a short course of prednisone for him to see if that helps in addition to liberal use of eucerine cream and topical steroids.   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 Will see if he can resume compression therapy to help decongest the skin. Can use topical triamcinolone 2-3 times a day to flaking skin above the wound as well.        [name], MSN, NP-C [facility] Pager: [phone] Office: [phone]  02/08/23  4:40 PM

## 2023-02-08 NOTE — Assessment & Plan Note (Signed)
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 Comparing the wound to 2 weeks ago when vac came off there seems to be increased edema around the wound with some slough at a few boarders. Discussed role of compression therapy to help decongest the skin to allow the wound to heal. Also discussed he will likely benefit from graft application to help prevent future infections of chronic open wound. Advised leg elevation, adequate protein intake (he has lost weight during the care required for the infections he has had) and compression as he can tolerate.   No signs of infection. Will release him to follow with ortho for ongoing wound care recommendations.  We will remain available to assist with antibiotic treatments if concerns going forward.

## 2023-02-08 NOTE — Patient Instructions (Addendum)
The wound looks healthy since the surgery and antibiotics   It will always be a risk for new infection with the top layer of skin being removed from the ulcer.   If you can tolerate putting the ace wrap on to provide some more compression to help with the swelling that will be very helpful. If too swollen it will never allow the wound to improve.   Keep the leg elevated multiple times a day especially the end of the day will help  Try to encourage yourself to take in extra protein to help keep the fluid where it should be (not in the tissues).   Please continue using the santyl to the areas where the yellow is to see if it will soften and come up - if the wound stays very wet, you can continue to use a dry dressing over it.    Please continue to follow with Dr. Lajoyce Corners and Dr. Drue Second and I will be available if there are any concerns   Will try some prednisone to help the itching for you -  Keep putting good lotion on it (Eucerine) is my favorite  May need to see Dr. Oneta Rack for his opinion on the skin

## 2023-02-08 NOTE — Telephone Encounter (Signed)
SW pt's wife, she stated that pt can't get this from [facility]. Asked for me to send to [facility]. I have sent this in for them for 7 tubes

## 2023-02-08 NOTE — Assessment & Plan Note (Signed)
I am not clear what to make of his rash / itching. He has had it a few times with use of antibiotics however he has since stopped 3 weeks ago. Normally if we suspect drug rash stopping the offending drug removes the rash. He does not suspect any contact dermatitis problems with any new products at home. He has not had any success with topical and feels this is more diffuse. Had success with oral pred and injection in the past. I can't offer injection today but will be willing to do a short course of prednisone for him to see if that helps in addition to liberal use of eucerine cream and topical steroids.   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 Will see if he can resume compression therapy to help decongest the skin. Can use topical triamcinolone 2-3 times a day to flaking skin above the wound as well.

## 2023-02-13 DIAGNOSIS — L97929 Non-pressure chronic ulcer of unspecified part of left lower leg with unspecified severity: Secondary | ICD-10-CM | POA: Diagnosis not present

## 2023-02-14 ENCOUNTER — Telehealth: Payer: Self-pay | Admitting: Orthopedic Surgery

## 2023-02-14 NOTE — Telephone Encounter (Signed)
I will send in to [facility] for him. This wasn't ordered due to it not being listed on the form he gave to me last week of supplies he needed.

## 2023-02-14 NOTE — Telephone Encounter (Signed)
Pt called stating he received supplies all but one. Pt states to be send supplies of 8045-T (latex flexible comprehensive bandages. Please call pt at [phone]. Please call pt when sent. Pt asking for 2 boxes.

## 2023-02-15 ENCOUNTER — Ambulatory Visit (INDEPENDENT_AMBULATORY_CARE_PROVIDER_SITE_OTHER): Payer: Medicare HMO | Admitting: Nurse Practitioner

## 2023-02-15 ENCOUNTER — Encounter: Payer: Self-pay | Admitting: Nurse Practitioner

## 2023-02-15 VITALS — BP 124/62 | HR 86 | Temp 97.9°F | Ht 66.5 in | Wt 168.2 lb

## 2023-02-15 DIAGNOSIS — L97323 Non-pressure chronic ulcer of left ankle with necrosis of muscle: Secondary | ICD-10-CM | POA: Diagnosis not present

## 2023-02-15 DIAGNOSIS — L239 Allergic contact dermatitis, unspecified cause: Secondary | ICD-10-CM | POA: Diagnosis not present

## 2023-02-15 DIAGNOSIS — L97929 Non-pressure chronic ulcer of unspecified part of left lower leg with unspecified severity: Secondary | ICD-10-CM | POA: Diagnosis not present

## 2023-02-15 DIAGNOSIS — I10 Essential (primary) hypertension: Secondary | ICD-10-CM

## 2023-02-15 MED ORDER — DEXAMETHASONE SODIUM PHOSPHATE 10 MG/ML IJ SOLN
10.0000 mg | Freq: Once | INTRAMUSCULAR | Status: AC
Start: 2023-02-15 — End: 2023-02-15
  Administered 2023-02-15: 10 mg via INTRAMUSCULAR

## 2023-02-15 NOTE — Patient Instructions (Addendum)
- Dexamethasone 10 mg IM X 1 today If symptoms persist he is to follow up with infectious disease or Dr. Lajoyce Corners -If you develop swelling of tongue or throat, difficulty breathing or swallowing go to the ER -Hydroxyzine 25 mg 1 tab in AM, 1 tab afternoon and 2 tabs at bedtime  Contact Dermatitis Dermatitis is when your skin becomes red, sore, and swollen.  Contact dermatitis happens when your body reacts to something that touches the skin. There are 2 types: Irritant contact dermatitis. This is when something bothers your skin, like soap. Allergic contact dermatitis. This is when your skin touches something you are allergic to, like poison ivy. What are the causes? Irritant contact dermatitis may be caused by: Makeup. Soaps. Detergents. Bleaches. Acids. Metals, like nickel. Allergic contact dermatitis may be caused by: Plants. Chemicals. Jewelry. Latex. Medicines. Preservatives. These are things added to products to help them last longer. There may be some in your clothes. What increases the risk? Having a job where you have to be near things that bother your skin. Having asthma or eczema. What are the signs or symptoms?  Dry or flaky skin. Redness. Cracks. Itching. Moderate symptoms of this condition include: Pain or a burning feeling. Blisters. Blood or clear fluid coming from cracks in your skin. Swelling. This may be on your eyelids, mouth, or genitals. How is this treated? Your doctor will find out what is making your skin react. Then, you can protect your skin. You may need to use: Steroid creams, ointments, or medicines. Antibiotics or other ointments, if you have a skin infection. Lotion or medicines to help with itching. A bandage. Follow these instructions at home: Skin care Put moisturizer on your skin when it needs it. Put cool, wet cloths on your skin (cool compresses). Put a baking soda paste on your skin. Stir water into baking soda until it looks like a  paste. Do not scratch your skin. Try not to have things rub up against your skin. Avoid tight clothing. Avoid using soaps, perfumes, and dyes. Check your skin every day for signs of infection. Check for: More redness, swelling, or pain. More fluid or blood. Warmth. Pus or a bad smell. Medicines Take or apply over-the-counter and prescription medicines only as told by your doctor. If you were prescribed antibiotics, take or apply them as told by your doctor. Do not stop using them even if you start to feel better. Bathing Take a bath with: Epsom salts. Baking soda. Colloidal oatmeal. Bathe less often. Bathe in warm water. Try not to use hot water. Bandage care If you were given a bandage, change it as told by your doctor. Wash your hands with soap and water for at least 20 seconds before and after you change your bandage. If you cannot use soap and water, use hand sanitizer. General instructions Avoid the things that caused your reaction. If you don't know what caused it, keep a journal. Write down: What you eat. What skin products you use. What you drink. What you wear. Contact a doctor if: You do not get better with treatment. You get worse. You have signs of infection. You have a fever. You have new symptoms. Your bone or joint near the area hurts after the skin has healed. Get help right away if: You see red streaks coming from the area. The area turns darker. You have trouble breathing. This information is not intended to replace advice given to you by your health care provider. Make sure you discuss  any questions you have with your health care provider. Document Revised: 12/02/2021 Document Reviewed: 12/02/2021 Elsevier Patient Education  2024 [employer].

## 2023-02-15 NOTE — Telephone Encounter (Signed)
Submitted in for requested supplies.

## 2023-02-15 NOTE — Progress Notes (Signed)
Assessment and Plan: [name] Fernandez was seen today for rash.  Diagnoses and all orders for this visit:  Essential hypertension - continue  DASH diet, exercise and monitor at home. Call if greater than 130/80.   Ankle ulcer, left, with necrosis of muscle (HCC) Continue to follow with Dr. Lajoyce Fernandez, has been released from infectious disease Continue dressings as prescribed  Allergic dermatitis Took course of Prednisone 20 mg 3 tabs x 2 days, 2 tabs x 2 days and 1 tab x 2 days and saw a little improvement - Dexamethasone 10 mg IM X 1 today If symptoms persist he is to follow up with infectious disease or Dr. Lajoyce Fernandez If develops swelling of tongue or throat, difficulty breathing or swallowing he is to go to the ER Hydroxyzine 25 mg 1 tab in AM, 1 tab afternoon and 2 tabs at bedtime        Further disposition pending results of labs. Discussed med's effects and SE's.   Over 30 minutes of exam, counseling, chart review, and critical decision making was performed.   Future Appointments  Date Time Provider Department Center  02/21/2023  2:30 PM [name] Huguenin, NP [facility] None  04/10/2023  3:00 PM [name] Cowboy, MD [facility] None    ------------------------------------------------------------------------------------------------------------------   HPI BP 124/62   Pulse 86   Temp 97.9 F (36.6 C)   Ht 5' 6.5" (1.689 m)   Wt 168 lb 3.2 oz (76.3 kg)   SpO2 96%   BMI 26.74 kg/m  82 y.o.male presents for complaints of rash all over his body. Was given prednisone 20 mg 3 tabs x 2 days, 2 tabs x 2 days and 1 tab x 2 days by infectious disease for rash on 02/07/23. States the rash continues and is extremely itchy.  Is currently on stomach , legs and arms. He takes hydroxzine and is not helping, can not sleep.    BP well controlled without medication BP Readings from Last 3 Encounters:  02/15/23 124/62  02/08/23 110/70  01/16/23 (!) 162/127  Denies headaches, chest pain, shortness of breath and  dizziness   BMI is Body mass index is 26.74 kg/m., he has not been working on diet and exercise. Wt Readings from Last 3 Encounters:  02/15/23 168 lb 3.2 oz (76.3 kg)  02/08/23 170 lb (77.1 kg)  01/25/23 170 lb (77.1 kg)   He is being evaluated by Dr. Lajoyce Fernandez and Infectious disease for ankle ulcer with necrosis of muscle. Last visit with infectious disease was 02/08/23 and they evaluated his rash. He has an up coming appointment with Dr. Audrie Fernandez office on 02/21/23. 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  [name] Alberts NP evaluated 02/08/23 I am not clear what to make of his rash / itching. He has had it a few times with use of antibiotics however he has since stopped 3 weeks ago. Normally if we suspect drug rash stopping the offending drug removes the rash. He does not suspect any contact dermatitis problems with any new products at home. He has not had any success with topical and feels this is more diffuse. Had success with oral pred and injection in the past. I can't offer injection today but will be willing to do a short course of prednisone for him to see if that helps in addition to liberal use  of eucerine cream and topical steroids.     Past Medical History:  Diagnosis Date   Anemia    Celiac disease    diagnoses 06/24/12   Chronic ulcer of ankle (HCC)    left   Esophageal stricture    GERD (gastroesophageal reflux disease)    Hiatal hernia    Hypertension    patient states " no current HTN"   Hypothyroidism    Pre-diabetes    Prediabetes    Shortness of breath    from oxycodone at times   Vitamin B12 deficiency    Vitamin D deficiency      Allergies  Allergen Reactions   Feraheme [Ferumoxytol] Swelling and Other (See Comments)    Swelling of lips and  tongue, could not talk 30 minutes after the infusion.   Latex Other (See Comments)    Latex butterfly bandages tore off the skin and caused terrible blisters   Sulfa Antibiotics Hives    Severe itching, blisters   Wound Dressing Adhesive Other (See Comments)    Latex butterfly bandages tore off the skin and caused terrible blisters   Levothyroxine Swelling and Other (See Comments)    Lip swelling   Doxycycline Nausea Only    Current Outpatient Medications on File Prior to Visit  Medication Sig   Ascorbic Acid (VITAMIN C) 1000 MG tablet Take 1,000 mg by mouth daily. (Patient not taking: Reported on 02/08/2023)   betamethasone valerate ointment (VALISONE) 0.1 % Apply 1 Application topically 2 (two) times daily. (Patient taking differently: Apply 1 Application topically daily.)   Cholecalciferol (VITAMIN D3) 125 MCG (5000 UT) TABS Take 10,000 Units by mouth daily. (Patient not taking: Reported on 02/08/2023)   clotrimazole-betamethasone (LOTRISONE) cream Apply 1 Application topically daily as needed (Rash).   collagenase (SANTYL) 250 UNIT/GM ointment Apply 1 Application topically daily. 7x 30 g tubes   gabapentin (NEURONTIN) 600 MG tablet Take 1/2 to 1 tablet 2 to 3 x /Daily as needed for Chronic Pain (Patient not taking: Reported on 02/08/2023)   Heparin Sod, Pork, Lock Flush (HEP-LOCK FLUSH IV) Inject 1 application  into the vein daily.   hydrOXYzine (ATARAX) 25 MG tablet TAKE 1 TABLET 3 X /DAY AS NEEDED FOR ANXIETY OR SLEEP (Patient taking differently: Take 50 mg by mouth at bedtime. Take  1 tablet  3 x /day  as needed  for Anxiety or Sleep)   leptospermum manuka honey (MEDIHONEY) PSTE paste Apply 1 Application topically daily.   NON FORMULARY Take 2 capsules by mouth every morning. Thyroid Complete (Patient not taking: Reported on 02/08/2023)   omeprazole (PRILOSEC OTC) 20 MG tablet Take 20 mg by mouth daily before breakfast.   oxyCODONE-acetaminophen (PERCOCET) 10-325 MG tablet Take 1 tablet  by mouth every 8 (eight) hours as needed.   polyethylene glycol (MIRALAX / GLYCOLAX) 17 g packet Take 17 g by mouth daily.   Red Yeast Rice Extract (RED YEAST RICE PO) Take 4,560 mg by mouth daily. 1140 mg each (Patient not taking: Reported on 02/08/2023)   senna-docusate (SENOKOT-S) 8.6-50 MG tablet Take 2 tablets by mouth 2 (two) times daily.   silver sulfADIAZINE (SILVADENE) 1 % cream Apply 1 Application topically daily. Apply to affected area daily plus dry dressing (Patient not taking: Reported on 02/08/2023)   sodium chloride 0.9 % infusion Inject into the vein.   torsemide (DEMADEX) 20 MG tablet Take  1 tablet  Daily for Fluid Retention / Leg Swelling (Patient taking differently: Take 20 mg by mouth  every morning. Fluid Retention / Leg Swelling)   triamcinolone cream (KENALOG) 0.1 % APPLY TO THE LEFT LEG UNDER THE WRAP DAILY WHERE THERE IS ITCHING OR RASH X 2 WEEKS OR UNTIL HEALED   triamcinolone cream (KENALOG) 0.1 % Apply 1 Application topically 2 (two) times daily.   zinc gluconate 50 MG tablet Take 50 mg by mouth daily. (Patient not taking: Reported on 02/08/2023)   No current facility-administered medications on file prior to visit.    ROS: all negative except above.   Physical Exam:  BP 124/62   Pulse 86   Temp 97.9 F (36.6 C)   Ht 5' 6.5" (1.689 m)   Wt 168 lb 3.2 oz (76.3 kg)   SpO2 96%   BMI 26.74 kg/m   General Appearance: Well nourished, in no apparent distress. Eyes: PERRLA, EOMs, conjunctiva no swelling or erythema Respiratory: Respiratory effort normal, BS equal bilaterally without rales, rhonchi, wheezing or stridor.  Cardio: RRR with no MRGs. Brisk peripheral pulses without edema.  Abdomen: Soft, + BS.  Non tender, no guarding, rebound, hernias, masses. Lymphatics: Non tender without lymphadenopathy.  Musculoskeletal: Full ROM, 5/5 strength, normal gait.  Skin: Warm, dry.  Red papules in clusters on stomach, arms and lower legs.  Dressing intact to left ankle  ulcer.  Neuro: Cranial nerves intact. Normal muscle tone, no cerebellar symptoms. Sensation intact.  Psych: Awake and oriented X 3, normal affect, Insight and Judgment appropriate.     Raynelle Dick, NP 2:12 PM [facility]

## 2023-02-21 ENCOUNTER — Encounter: Payer: Self-pay | Admitting: Family

## 2023-02-21 ENCOUNTER — Ambulatory Visit (INDEPENDENT_AMBULATORY_CARE_PROVIDER_SITE_OTHER): Payer: Medicare HMO | Admitting: Family

## 2023-02-21 DIAGNOSIS — Z945 Skin transplant status: Secondary | ICD-10-CM

## 2023-02-21 DIAGNOSIS — L97323 Non-pressure chronic ulcer of left ankle with necrosis of muscle: Secondary | ICD-10-CM

## 2023-02-21 NOTE — Progress Notes (Signed)
Office Visit Note   Patient: [name] Fernandez           Date of Birth: [birth year]           MRN: [id number] Visit Date: 02/21/2023              Requested by: Lucky Cowboy, MD [address] [place],  [place] [address] PCP: Lucky Cowboy, MD  Chief Complaint  Patient presents with   Left Ankle - Routine Post Op    01/12/23 left ankle deb      HPI: The patient is an 82 year old gentleman status post debridement of chronic lateral left ankle wound.  He has been doing dry dressing changes for the last 1 week.    He has used a very thin amount of Santyl areas of fibrinous tissue.  Has been having difficulty for months now with a rash to upper extremity lower extremities trunk and back which is burning painful as well as itchy.  Most recently has tried a steroid injection coupled with oral prednisone without any relief of his symptoms from his primary care office   He and his wife are quite concerned that this has continued to go on for over a year now without any improvement.  He has recently lost about 20 pounds over the last few months  Has taken Ensure in the past but has not been consistent with drinking it.  Assessment & Plan: Visit Diagnoses: No diagnosis found.  Plan: Kerecis micro powder applied today.  Discussed using the scaffolding dressing leaving this in place for 1 week he will remove all of this after 1 week and return to daily Dial soap cleansing dry dressings.  Follow-up in 2 weeks. Follow-Up Instructions: No follow-ups on file.   Ortho Exam  Patient is alert, oriented, no adenopathy, well-dressed, normal affect, normal respiratory effort. On examination the wound edges are without epiboly.  There is no maceration or surrounding cellulitis or erythema.  He does have scant fibrinous exudative tissue to the anterior and posterior aspects of the wound.  Today this measures 11 cm x 8-1/2 cm no active drainage  Imaging: No results  found.    Labs: Lab Results  Component Value Date   HGBA1C 6.3 (H) 10/05/2022   HGBA1C 6.4 (H) 04/04/2022   HGBA1C 5.9 (H) 09/14/2021   ESRSEDRATE 34 (H) 01/15/2023   ESRSEDRATE 32 (H) 06/15/2022   ESRSEDRATE 40 (H) 06/14/2022   CRP 0.7 01/15/2023   CRP 0.8 06/15/2022   CRP <0.5 06/14/2022   LABURIC 7.8 05/20/2014   LABURIC 6.8 05/20/2013   REPTSTATUS 01/17/2023 FINAL 01/12/2023   GRAMSTAIN NO WBC SEEN NO ORGANISMS SEEN  01/12/2023   CULT  01/12/2023    No growth aerobically or anaerobically. Performed at [facility], [address], [place], [place] [address]    Imelda Pillow PSEUDOMONAS AERUGINOSA 11/10/2022   LABORGA ENTEROCOCCUS FAECALIS 11/10/2022     Lab Results  Component Value Date   ALBUMIN 2.9 (L) 06/16/2022   ALBUMIN 3.8 06/14/2022   ALBUMIN 4.0 09/25/2016   PREALBUMIN 18 06/14/2022    Lab Results  Component Value Date   MG 1.8 06/19/2022   MG 1.9 04/04/2022   MG 1.9 03/16/2021   Lab Results  Component Value Date   VD25OH 57 10/05/2022   VD25OH 59 04/04/2022   VD25OH 44 09/28/2020    Lab Results  Component Value Date   PREALBUMIN 18 06/14/2022      Latest Ref Rng &  Units 01/15/2023    4:53 PM 01/12/2023    8:56 AM 11/10/2022    9:56 AM  CBC EXTENDED  WBC 4.0 - 10.5 K/uL 8.9  8.1  14.6   RBC 4.22 - 5.81 MIL/uL 3.55  3.59  3.92   Hemoglobin 13.0 - 17.0 g/dL 75.6  43.3  29.5   HCT 39.0 - 52.0 % 33.0  33.5  36.3   Platelets 150 - 400 K/uL 340  356  371   NEUT# 1.7 - 7.7 K/uL 4.8     Lymph# 0.7 - 4.0 K/uL 2.3        There is no height or weight on file to calculate BMI.  Orders:  No orders of the defined types were placed in this encounter.  No orders of the defined types were placed in this encounter.    Procedures: No procedures performed  Clinical Data: No additional findings.  ROS:  All other systems negative, except as noted in the HPI. Review of Systems  Objective: Vital Signs: There were no vitals taken for this  visit.  Specialty Comments:  No specialty comments available.  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) 06/16/2022   Osteomyelitis (HCC) 06/16/2022   Tenosynovitis of tibialis posterior tendon 06/15/2022   Wound infection 06/15/2022   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   Anemia 03/13/2019   Chronic venous insufficiency 03/12/2019   CKD (chronic kidney disease) stage 2, 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/2015   COPD (chronic obstructive pulmonary disease) (HCC) 12/08/2015   Chronic pain syndrome 05/26/2015   DDD (degenerative disc disease), lumbar 05/26/2015   Medication management 05/26/2015   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   Lumbago with sciatica 08/06/2014   Hyperlipidemia 08/20/2013   Hypertension    Abnormal glucose    Vitamin D deficiency    Arthritis    Vitamin B12 deficiency    Celiac disease 06/19/2012   GERD (gastroesophageal reflux disease) 04/30/2012   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 tube) procedure 04/30/2012   Hypothyroidism 04/30/2012   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   Past Medical History:  Diagnosis Date   Anemia    Celiac disease    diagnoses 06/24/12   Chronic ulcer of ankle (HCC)    left   Esophageal stricture    GERD (gastroesophageal reflux disease)    Hiatal hernia    Hypertension    patient states " no current HTN"   Hypothyroidism    Pre-diabetes    Prediabetes    Shortness of breath    from oxycodone at times   Vitamin B12 deficiency    Vitamin D deficiency     Family History  Problem Relation Age of Onset   Esophageal cancer Father     Past Surgical History:  Procedure Laterality Date   APPENDECTOMY     APPLICATION OF WOUND VAC Left 01/12/2023   Procedure: APPLICATION OF  WOUND VAC;  Surgeon: Nadara Mustard, MD;  Location: [facility] OR;  Service: Orthopedics;  Laterality: Left;   BACK SURGERY     3 diff surgeries for vertebrea broken   EYE SURGERY     bilateral cataract surgery   HERNIA REPAIR  02/10/1949   bilateral groin as child   HIATAL HERNIA REPAIR  I & D EXTREMITY Left 06/16/2022   Procedure: IRRIGATION AND DEBRIDEMENT ANKLE;  Surgeon: Nadara Mustard, MD;  Location: [facility] OR;  Service: Orthopedics;  Laterality: Left;   I & D EXTREMITY Left 07/21/2022   Procedure: LEFT ANKLE DEBRIDEMENT;  Surgeon: Nadara Mustard, MD;  Location: [facility] OR;  Service: Orthopedics;  Laterality: Left;   I & D EXTREMITY Left 11/10/2022   Procedure: LEFT ANKLE DEBRIDEMENT;  Surgeon: Nadara Mustard, MD;  Location: [facility] OR;  Service: Orthopedics;  Laterality: Left;   I & D EXTREMITY Left 01/12/2023   Procedure: LEFT ANKLE DEBRIDEMENT;  Surgeon: Nadara Mustard, MD;  Location: [facility] OR;  Service: Orthopedics;  Laterality: Left;   JOINT REPLACEMENT  04/12/2010   right knee replacement   TONSILLECTOMY     as child   TOTAL KNEE REVISION  11/20/2011   Procedure: TOTAL KNEE REVISION;  Surgeon: Shelda Pal, MD;  Location: [facility] ORS;  Service: Orthopedics;  Laterality: Right;  Right Total Knee Revision   Social History   Occupational History   Occupation: Retired    Associate Professor: [employer]  Tobacco Use   Smoking status: Former    Current packs/day: 0.00    Average packs/day: 1 pack/day for 20.0 years (20.0 ttl pk-yrs)    Types: Cigarettes    Start date: 06/27/1960    Quit date: 06/27/1980    Years since quitting: 42.6    Passive exposure: Past   Smokeless tobacco: Never  Vaping Use   Vaping status: Never Used  Substance and Sexual Activity   Alcohol use: No   Drug use: No   Sexual activity: Yes

## 2023-03-12 ENCOUNTER — Other Ambulatory Visit: Payer: Self-pay | Admitting: Nurse Practitioner

## 2023-03-12 ENCOUNTER — Other Ambulatory Visit: Payer: Self-pay | Admitting: Internal Medicine

## 2023-03-12 ENCOUNTER — Ambulatory Visit (INDEPENDENT_AMBULATORY_CARE_PROVIDER_SITE_OTHER): Payer: Medicare HMO | Admitting: Orthopedic Surgery

## 2023-03-12 DIAGNOSIS — R609 Edema, unspecified: Secondary | ICD-10-CM

## 2023-03-12 DIAGNOSIS — I10 Essential (primary) hypertension: Secondary | ICD-10-CM

## 2023-03-12 DIAGNOSIS — L97323 Non-pressure chronic ulcer of left ankle with necrosis of muscle: Secondary | ICD-10-CM

## 2023-03-13 ENCOUNTER — Encounter: Payer: Self-pay | Admitting: Orthopedic Surgery

## 2023-03-13 NOTE — Progress Notes (Signed)
Office Visit Note   Patient: [name] Fernandez           Date of Birth: 10-08-1940           MRN: [id number] Visit Date: 03/12/2023              Requested by: Lucky Cowboy, MD [address] [place],  [place] [address] PCP: Lucky Cowboy, MD  Chief Complaint  Patient presents with   Left Ankle - Routine Post Op    01/12/23 left ankle deb      HPI: Patient is an 82 year old gentleman who presents almost 2 months status post repeat debridement and tissue graft lateral left ankle in the wound.  Patient had Kerecis tissue graft reinforcement 2 weeks ago.  Patient has not changed his dressing for 2 weeks since he states it felt good and did not want to take it off.  Assessment & Plan: Visit Diagnoses:  1. Ankle ulcer, left, with necrosis of muscle (HCC)     Plan: We will give patient a sample bottle of Vashe to apply with dampened Kerlix daily.  Continue with compression wrap and Dial soap cleansing.  With the slow healing patient may have an increased biofilm burden.  Follow-Up Instructions: Return in about 1 week (around 03/19/2023).   Ortho Exam  Patient is alert, oriented, no adenopathy, well-dressed, normal affect, normal respiratory effort. Examination patient is lateral wound has a area of healthy granulation tissue that is 10 x 6 cm and about 3 mm deep.  There is no maceration in the periwound area no cellulitis he does have swelling in the calf and clear drainage from the wound.  There is no exposed bone or tendon.  Imaging: No results found. No images are attached to the encounter.  Labs: Lab Results  Component Value Date   HGBA1C 6.3 (H) 10/05/2022   HGBA1C 6.4 (H) 04/04/2022   HGBA1C 5.9 (H) 09/14/2021   ESRSEDRATE 34 (H) 01/15/2023   ESRSEDRATE 32 (H) 06/15/2022   ESRSEDRATE 40 (H) 06/14/2022   CRP 0.7 01/15/2023   CRP 0.8 06/15/2022   CRP <0.5 06/14/2022   LABURIC 7.8 05/20/2014   LABURIC 6.8 05/20/2013   REPTSTATUS 01/17/2023 FINAL  01/12/2023   GRAMSTAIN NO WBC SEEN NO ORGANISMS SEEN  01/12/2023   CULT  01/12/2023    No growth aerobically or anaerobically. Performed at [facility], [address] [place], [place] [address]    LABORGA PSEUDOMONAS AERUGINOSA 11/10/2022   LABORGA ENTEROCOCCUS FAECALIS 11/10/2022     Lab Results  Component Value Date   ALBUMIN 2.9 (L) 06/16/2022   ALBUMIN 3.8 06/14/2022   ALBUMIN 4.0 09/25/2016   PREALBUMIN 18 06/14/2022    Lab Results  Component Value Date   MG 1.8 06/19/2022   MG 1.9 04/04/2022   MG 1.9 03/16/2021   Lab Results  Component Value Date   VD25OH 57 10/05/2022   VD25OH 59 04/04/2022   VD25OH 44 09/28/2020    Lab Results  Component Value Date   PREALBUMIN 18 06/14/2022      Latest Ref Rng & Units 01/15/2023    4:53 PM 01/12/2023    8:56 AM 11/10/2022    9:56 AM  CBC EXTENDED  WBC 4.0 - 10.5 K/uL 8.9  8.1  14.6   RBC 4.22 - 5.81 MIL/uL 3.55  3.59  3.92   Hemoglobin 13.0 - 17.0 g/dL 82.9  56.2  13.0   HCT 39.0 - 52.0 % 33.0  33.5  36.3   Platelets 150 - 400 K/uL 340  356  371   NEUT# 1.7 - 7.7 K/uL 4.8     Lymph# 0.7 - 4.0 K/uL 2.3        There is no height or weight on file to calculate BMI.  Orders:  No orders of the defined types were placed in this encounter.  No orders of the defined types were placed in this encounter.    Procedures: No procedures performed  Clinical Data: No additional findings.  ROS:  All other systems negative, except as noted in the HPI. Review of Systems  Objective: Vital Signs: There were no vitals taken for this visit.  Specialty Comments:  No specialty comments available.  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) 06/16/2022   Osteomyelitis (HCC)  06/16/2022   Tenosynovitis of tibialis posterior tendon 06/15/2022   Wound infection 06/15/2022   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   Anemia 03/13/2019   Chronic venous insufficiency 03/12/2019   CKD (chronic kidney disease) stage 2, 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/2015   COPD (chronic obstructive pulmonary disease) (HCC) 12/08/2015   Chronic pain syndrome 05/26/2015   DDD (degenerative disc disease), lumbar 05/26/2015   Medication management 05/26/2015   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   Lumbago with sciatica 08/06/2014   Hyperlipidemia 08/20/2013   Hypertension    Abnormal glucose    Vitamin D deficiency    Arthritis    Vitamin B12 deficiency    Celiac disease 06/19/2012   GERD (gastroesophageal reflux disease) 04/30/2012   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 tube) procedure 04/30/2012   Hypothyroidism 04/30/2012   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   Past Medical History:  Diagnosis Date   Anemia    Celiac disease    diagnoses 06/24/12   Chronic ulcer of ankle (HCC)    left   Esophageal stricture    GERD (gastroesophageal reflux disease)    Hiatal hernia    Hypertension    patient states " no current HTN"   Hypothyroidism    Pre-diabetes    Prediabetes    Shortness of breath    from oxycodone at times   Vitamin B12 deficiency    Vitamin D deficiency     Family History  Problem Relation Age of Onset   Esophageal cancer Father     Past Surgical History:  Procedure Laterality Date   APPENDECTOMY     APPLICATION OF WOUND VAC Left 01/12/2023   Procedure: APPLICATION OF WOUND VAC;  Surgeon: Nadara Mustard, MD;  Location: [facility] OR;  Service: Orthopedics;  Laterality: Left;   BACK SURGERY     3 diff surgeries for vertebrea broken   EYE SURGERY     bilateral cataract surgery   HERNIA REPAIR  02/10/1949   bilateral groin as child   HIATAL HERNIA REPAIR     I & D EXTREMITY Left 06/16/2022   Procedure: IRRIGATION AND DEBRIDEMENT ANKLE;  Surgeon: Nadara Mustard, MD;  Location: [facility] OR;  Service: Orthopedics;  Laterality: Left;   I & D EXTREMITY Left 07/21/2022   Procedure: LEFT ANKLE DEBRIDEMENT;  Surgeon: Nadara Mustard, MD;  Location: [facility] OR;  Service: Orthopedics;  Laterality: Left;   I & D EXTREMITY Left 11/10/2022  Procedure: LEFT ANKLE DEBRIDEMENT;  Surgeon: Nadara Mustard, MD;  Location: [facility] OR;  Service: Orthopedics;  Laterality: Left;   I & D EXTREMITY Left 01/12/2023   Procedure: LEFT ANKLE DEBRIDEMENT;  Surgeon: Nadara Mustard, MD;  Location: [facility] OR;  Service: Orthopedics;  Laterality: Left;   JOINT REPLACEMENT  04/12/2010   right knee replacement   TONSILLECTOMY     as child   TOTAL KNEE REVISION  11/20/2011   Procedure: TOTAL KNEE REVISION;  Surgeon: Shelda Pal, MD;  Location: [facility]S;  Service: Orthopedics;  Laterality: Right;  Right Total Knee Revision   Social History   Occupational History   Occupation: Retired    Associate Professor: [employer]  Tobacco Use   Smoking status: Former    Current packs/day: 0.00    Average packs/day: 1 pack/day for 20.0 years (20.0 ttl pk-yrs)    Types: Cigarettes    Start date: 06/27/1960    Quit date: 06/27/1980    Years since quitting: 42.7    Passive exposure: Past   Smokeless tobacco: Never  Vaping Use   Vaping status: Never Used  Substance and Sexual Activity   Alcohol use: No   Drug use: No   Sexual activity: Yes

## 2023-03-14 ENCOUNTER — Other Ambulatory Visit: Payer: Self-pay | Admitting: Internal Medicine

## 2023-03-14 DIAGNOSIS — F419 Anxiety disorder, unspecified: Secondary | ICD-10-CM

## 2023-03-19 ENCOUNTER — Ambulatory Visit (INDEPENDENT_AMBULATORY_CARE_PROVIDER_SITE_OTHER): Payer: Medicare HMO | Admitting: Orthopedic Surgery

## 2023-03-19 ENCOUNTER — Telehealth: Payer: Self-pay

## 2023-03-19 DIAGNOSIS — Z945 Skin transplant status: Secondary | ICD-10-CM

## 2023-03-19 DIAGNOSIS — L97323 Non-pressure chronic ulcer of left ankle with necrosis of muscle: Secondary | ICD-10-CM

## 2023-03-19 NOTE — Telephone Encounter (Signed)
Pt was in the office today and requested a refill of his medical dressings from [facility]. Order submitted on line and printed order form today.

## 2023-03-20 ENCOUNTER — Encounter: Payer: Self-pay | Admitting: Orthopedic Surgery

## 2023-03-20 DIAGNOSIS — L97929 Non-pressure chronic ulcer of unspecified part of left lower leg with unspecified severity: Secondary | ICD-10-CM | POA: Diagnosis not present

## 2023-03-20 NOTE — Progress Notes (Signed)
Office Visit Note   Patient: [name] Fernandez           Date of Birth: [birth year]           MRN: [id number] Visit Date: 03/19/2023              Requested by: Lucky Cowboy, MD [address] [place],  [place] [address] PCP: Lucky Cowboy, MD  Chief Complaint  Patient presents with   Left Ankle - Routine Post Op    01/12/23 left ankle deb      HPI: Patient is an 82 year old gentleman who presents 2 months status post left ankle debridement and tissue graft.  He has clear drainage.  Currently using Vashe irrigation.  Assessment & Plan: Visit Diagnoses:  1. Ankle ulcer, left, with necrosis of muscle (HCC)   2. H/O skin graft     Plan: Recommended he resume using the Vive wear compression sock.  Follow-Up Instructions: Return in about 2 weeks (around 04/02/2023).   Ortho Exam  Patient is alert, oriented, no adenopathy, well-dressed, normal affect, normal respiratory effort. Examination there is still increased venous swelling.  The wound bed has 75% healthy granulation tissue with decreased fibrinous exudate secondary to using the Vashe irrigation.  Wound measures 11 x 7 cm in its largest dimension.  Imaging: No results found. No images are attached to the encounter.  Labs: Lab Results  Component Value Date   HGBA1C 6.3 (H) 10/05/2022   HGBA1C 6.4 (H) 04/04/2022   HGBA1C 5.9 (H) 09/14/2021   ESRSEDRATE 34 (H) 01/15/2023   ESRSEDRATE 32 (H) 06/15/2022   ESRSEDRATE 40 (H) 06/14/2022   CRP 0.7 01/15/2023   CRP 0.8 06/15/2022   CRP <0.5 06/14/2022   LABURIC 7.8 05/20/2014   LABURIC 6.8 05/20/2013   REPTSTATUS 01/17/2023 FINAL 01/12/2023   GRAMSTAIN NO WBC SEEN NO ORGANISMS SEEN  01/12/2023   CULT  01/12/2023    No growth aerobically or anaerobically. Performed at [facility], [address], [place], [place] [address]    LABORGA PSEUDOMONAS AERUGINOSA 11/10/2022   LABORGA ENTEROCOCCUS FAECALIS 11/10/2022     Lab Results  Component  Value Date   ALBUMIN 2.9 (L) 06/16/2022   ALBUMIN 3.8 06/14/2022   ALBUMIN 4.0 09/25/2016   PREALBUMIN 18 06/14/2022    Lab Results  Component Value Date   MG 1.8 06/19/2022   MG 1.9 04/04/2022   MG 1.9 03/16/2021   Lab Results  Component Value Date   VD25OH 57 10/05/2022   VD25OH 59 04/04/2022   VD25OH 44 09/28/2020    Lab Results  Component Value Date   PREALBUMIN 18 06/14/2022      Latest Ref Rng & Units 01/15/2023    4:53 PM 01/12/2023    8:56 AM 11/10/2022    9:56 AM  CBC EXTENDED  WBC 4.0 - 10.5 K/uL 8.9  8.1  14.6   RBC 4.22 - 5.81 MIL/uL 3.55  3.59  3.92   Hemoglobin 13.0 - 17.0 g/dL 24.4  01.0  27.2   HCT 39.0 - 52.0 % 33.0  33.5  36.3   Platelets 150 - 400 K/uL 340  356  371   NEUT# 1.7 - 7.7 K/uL 4.8     Lymph# 0.7 - 4.0 K/uL 2.3        There is no height or weight on file to calculate BMI.  Orders:  No orders of the defined types were placed in this encounter.  No orders of the  defined types were placed in this encounter.    Procedures: No procedures performed  Clinical Data: No additional findings.  ROS:  All other systems negative, except as noted in the HPI. Review of Systems  Objective: Vital Signs: There were no vitals taken for this visit.  Specialty Comments:  No specialty comments available.  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) 06/16/2022   Osteomyelitis (HCC) 06/16/2022   Tenosynovitis of tibialis posterior tendon 06/15/2022   Wound infection 06/15/2022   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   Anemia 03/13/2019   Chronic venous insufficiency 03/12/2019   CKD (chronic kidney disease) stage 2, 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/2015   COPD (chronic obstructive pulmonary disease) (HCC)  12/08/2015   Chronic pain syndrome 05/26/2015   DDD (degenerative disc disease), lumbar 05/26/2015   Medication management 05/26/2015   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   Lumbago with sciatica 08/06/2014   Hyperlipidemia 08/20/2013   Hypertension    Abnormal glucose    Vitamin D deficiency    Arthritis    Vitamin B12 deficiency    Celiac disease 06/19/2012   GERD (gastroesophageal reflux disease) 04/30/2012   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 tube) procedure 04/30/2012   Hypothyroidism 04/30/2012   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   Past Medical History:  Diagnosis Date   Anemia    Celiac disease    diagnoses 06/24/12   Chronic ulcer of ankle (HCC)    left   Esophageal stricture    GERD (gastroesophageal reflux disease)    Hiatal hernia    Hypertension    patient states " no current HTN"   Hypothyroidism    Pre-diabetes    Prediabetes    Shortness of breath    from oxycodone at times   Vitamin B12 deficiency    Vitamin D deficiency     Family History  Problem Relation Age of Onset   Esophageal cancer Father     Past Surgical History:  Procedure Laterality Date   APPENDECTOMY     APPLICATION OF WOUND VAC Left 01/12/2023   Procedure: APPLICATION OF WOUND VAC;  Surgeon: Nadara Mustard, MD;  Location: [facility] OR;  Service: Orthopedics;  Laterality: Left;   BACK SURGERY     3 diff surgeries for vertebrea broken   EYE SURGERY     bilateral cataract surgery   HERNIA REPAIR  02/10/1949   bilateral groin as child   HIATAL HERNIA REPAIR     I & D EXTREMITY Left 06/16/2022   Procedure: IRRIGATION AND DEBRIDEMENT ANKLE;  Surgeon: Nadara Mustard, MD;  Location: [facility] OR;  Service: Orthopedics;  Laterality: Left;   I & D EXTREMITY Left 07/21/2022   Procedure: LEFT ANKLE DEBRIDEMENT;  Surgeon: Nadara Mustard, MD;  Location: [facility] OR;  Service: Orthopedics;  Laterality: Left;   I & D EXTREMITY Left 11/10/2022   Procedure: LEFT ANKLE DEBRIDEMENT;  Surgeon: Nadara Mustard, MD;   Location: [facility] OR;  Service: Orthopedics;  Laterality: Left;   I & D EXTREMITY Left 01/12/2023   Procedure: LEFT ANKLE DEBRIDEMENT;  Surgeon: Nadara Mustard, MD;  Location: [facility] OR;  Service: Orthopedics;  Laterality: Left;   JOINT REPLACEMENT  04/12/2010   right knee replacement   TONSILLECTOMY  as child   TOTAL KNEE REVISION  11/20/2011   Procedure: TOTAL KNEE REVISION;  Surgeon: Shelda Pal, MD;  Location: [facility] ORS;  Service: Orthopedics;  Laterality: Right;  Right Total Knee Revision   Social History   Occupational History   Occupation: Retired    Associate Professor: [employer]  Tobacco Use   Smoking status: Former    Current packs/day: 0.00    Average packs/day: 1 pack/day for 20.0 years (20.0 ttl pk-yrs)    Types: Cigarettes    Start date: 06/27/1960    Quit date: 06/27/1980    Years since quitting: 42.7    Passive exposure: Past   Smokeless tobacco: Never  Vaping Use   Vaping status: Never Used  Substance and Sexual Activity   Alcohol use: No   Drug use: No   Sexual activity: Yes

## 2023-03-27 ENCOUNTER — Encounter: Payer: Medicare HMO | Admitting: Orthopedic Surgery

## 2023-04-03 ENCOUNTER — Ambulatory Visit: Payer: Medicare HMO | Admitting: Orthopedic Surgery

## 2023-04-03 DIAGNOSIS — H52223 Regular astigmatism, bilateral: Secondary | ICD-10-CM | POA: Diagnosis not present

## 2023-04-03 DIAGNOSIS — H524 Presbyopia: Secondary | ICD-10-CM | POA: Diagnosis not present

## 2023-04-03 DIAGNOSIS — H35363 Drusen (degenerative) of macula, bilateral: Secondary | ICD-10-CM | POA: Diagnosis not present

## 2023-04-03 DIAGNOSIS — H26053 Posterior subcapsular polar infantile and juvenile cataract, bilateral: Secondary | ICD-10-CM | POA: Diagnosis not present

## 2023-04-03 DIAGNOSIS — H40023 Open angle with borderline findings, high risk, bilateral: Secondary | ICD-10-CM | POA: Diagnosis not present

## 2023-04-05 ENCOUNTER — Ambulatory Visit (INDEPENDENT_AMBULATORY_CARE_PROVIDER_SITE_OTHER): Payer: Medicare HMO | Admitting: Orthopedic Surgery

## 2023-04-05 DIAGNOSIS — L97323 Non-pressure chronic ulcer of left ankle with necrosis of muscle: Secondary | ICD-10-CM

## 2023-04-05 DIAGNOSIS — Z945 Skin transplant status: Secondary | ICD-10-CM

## 2023-04-09 ENCOUNTER — Encounter: Payer: Self-pay | Admitting: Orthopedic Surgery

## 2023-04-09 NOTE — Progress Notes (Signed)
Office Visit Note   Patient: [name] Fernandez           Date of Birth: 1940-08-26           MRN: [id number] Visit Date: 04/05/2023              Requested by: Lucky Cowboy, MD [address] [place],  [place] [address] PCP: Lucky Cowboy, MD  Chief Complaint  Patient presents with   Left Ankle - Routine Post Op    01/12/23 left ankle deb         HPI: Patient is an 82 year old gentleman status post debridement left ankle and tissue graft.  Patient is currently using Vashe irrigation for wound cleansing.  Patient states that his wound is looking better.  Assessment & Plan: Visit Diagnoses:  1. Ankle ulcer, left, with necrosis of muscle (HCC)   2. H/O skin graft     Plan: Continue with the Vashe wound cleansing.  Patient states he is unable to get a sock on that is too painful.  Follow-Up Instructions: Return in about 4 weeks (around 05/03/2023).   Ortho Exam  Patient is alert, oriented, no adenopathy, well-dressed, normal affect, normal respiratory effort. Examination the wound has 75% healthy granulation tissue there is new superficial epithelialization around the wound edges.  The wound measures 11 x 6 cm with flat granulation tissue.  Imaging: No results found. No images are attached to the encounter.  Labs: Lab Results  Component Value Date   HGBA1C 6.3 (H) 10/05/2022   HGBA1C 6.4 (H) 04/04/2022   HGBA1C 5.9 (H) 09/14/2021   ESRSEDRATE 34 (H) 01/15/2023   ESRSEDRATE 32 (H) 06/15/2022   ESRSEDRATE 40 (H) 06/14/2022   CRP 0.7 01/15/2023   CRP 0.8 06/15/2022   CRP <0.5 06/14/2022   LABURIC 7.8 05/20/2014   LABURIC 6.8 05/20/2013   REPTSTATUS 01/17/2023 FINAL 01/12/2023   GRAMSTAIN NO WBC SEEN NO ORGANISMS SEEN  01/12/2023   CULT  01/12/2023    No growth aerobically or anaerobically. Performed at [facility], [address] [place], [place] [address]    LABORGA PSEUDOMONAS AERUGINOSA 11/10/2022   LABORGA ENTEROCOCCUS FAECALIS  11/10/2022     Lab Results  Component Value Date   ALBUMIN 2.9 (L) 06/16/2022   ALBUMIN 3.8 06/14/2022   ALBUMIN 4.0 09/25/2016   PREALBUMIN 18 06/14/2022    Lab Results  Component Value Date   MG 1.8 06/19/2022   MG 1.9 04/04/2022   MG 1.9 03/16/2021   Lab Results  Component Value Date   VD25OH 57 10/05/2022   VD25OH 59 04/04/2022   VD25OH 44 09/28/2020    Lab Results  Component Value Date   PREALBUMIN 18 06/14/2022      Latest Ref Rng & Units 01/15/2023    4:53 PM 01/12/2023    8:56 AM 11/10/2022    9:56 AM  CBC EXTENDED  WBC 4.0 - 10.5 K/uL 8.9  8.1  14.6   RBC 4.22 - 5.81 MIL/uL 3.55  3.59  3.92   Hemoglobin 13.0 - 17.0 g/dL 10.2  72.5  36.6   HCT 39.0 - 52.0 % 33.0  33.5  36.3   Platelets 150 - 400 K/uL 340  356  371   NEUT# 1.7 - 7.7 K/uL 4.8     Lymph# 0.7 - 4.0 K/uL 2.3        There is no height or weight on file to calculate BMI.  Orders:  No orders of the  defined types were placed in this encounter.  No orders of the defined types were placed in this encounter.    Procedures: No procedures performed  Clinical Data: No additional findings.  ROS:  All other systems negative, except as noted in the HPI. Review of Systems  Objective: Vital Signs: There were no vitals taken for this visit.  Specialty Comments:  No specialty comments available.  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) 06/16/2022   Osteomyelitis (HCC) 06/16/2022   Tenosynovitis of tibialis posterior tendon 06/15/2022   Wound infection 06/15/2022   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   Anemia 03/13/2019   Chronic venous insufficiency 03/12/2019   CKD (chronic kidney disease) stage 2, 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/2015   COPD  (chronic obstructive pulmonary disease) (HCC) 12/08/2015   Chronic pain syndrome 05/26/2015   DDD (degenerative disc disease), lumbar 05/26/2015   Medication management 05/26/2015   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   Lumbago with sciatica 08/06/2014   Hyperlipidemia 08/20/2013   Hypertension    Abnormal glucose    Vitamin D deficiency    Arthritis    Vitamin B12 deficiency    Celiac disease 06/19/2012   GERD (gastroesophageal reflux disease) 04/30/2012   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 tube) procedure 04/30/2012   Hypothyroidism 04/30/2012   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   Past Medical History:  Diagnosis Date   Anemia    Celiac disease    diagnoses 06/24/12   Chronic ulcer of ankle (HCC)    left   Esophageal stricture    GERD (gastroesophageal reflux disease)    Hiatal hernia    Hypertension    patient states " no current HTN"   Hypothyroidism    Pre-diabetes    Prediabetes    Shortness of breath    from oxycodone at times   Vitamin B12 deficiency    Vitamin D deficiency     Family History  Problem Relation Age of Onset   Esophageal cancer Father     Past Surgical History:  Procedure Laterality Date   APPENDECTOMY     APPLICATION OF WOUND VAC Left 01/12/2023   Procedure: APPLICATION OF WOUND VAC;  Surgeon: Nadara Mustard, MD;  Location: [facility] OR;  Service: Orthopedics;  Laterality: Left;   BACK SURGERY     3 diff surgeries for vertebrea broken   EYE SURGERY     bilateral cataract surgery   HERNIA REPAIR  02/10/1949   bilateral groin as child   HIATAL HERNIA REPAIR     I & D EXTREMITY Left 06/16/2022   Procedure: IRRIGATION AND DEBRIDEMENT ANKLE;  Surgeon: Nadara Mustard, MD;  Location: [facility] OR;  Service: Orthopedics;  Laterality: Left;   I & D EXTREMITY Left 07/21/2022   Procedure: LEFT ANKLE DEBRIDEMENT;  Surgeon: Nadara Mustard, MD;  Location: [facility] OR;  Service: Orthopedics;  Laterality: Left;   I & D EXTREMITY Left 11/10/2022   Procedure: LEFT ANKLE  DEBRIDEMENT;  Surgeon: Nadara Mustard, MD;  Location: [facility] OR;  Service: Orthopedics;  Laterality: Left;   I & D EXTREMITY Left 01/12/2023   Procedure: LEFT ANKLE DEBRIDEMENT;  Surgeon: Nadara Mustard, MD;  Location: [facility] OR;  Service: Orthopedics;  Laterality: Left;   JOINT REPLACEMENT  04/12/2010   right knee replacement   TONSILLECTOMY     as child   TOTAL KNEE REVISION  11/20/2011   Procedure: TOTAL KNEE REVISION;  Surgeon: Shelda Pal, MD;  Location: [facility];  Service: Orthopedics;  Laterality: Right;  Right Total Knee Revision   Social History   Occupational History   Occupation: Retired    Associate Professor: [employer]  Tobacco Use   Smoking status: Former    Current packs/day: 0.00    Average packs/day: 1 pack/day for 20.0 years (20.0 ttl pk-yrs)    Types: Cigarettes    Start date: 06/27/1960    Quit date: 06/27/1980    Years since quitting: 42.8    Passive exposure: Past   Smokeless tobacco: Never  Vaping Use   Vaping status: Never Used  Substance and Sexual Activity   Alcohol use: No   Drug use: No   Sexual activity: Yes

## 2023-04-10 ENCOUNTER — Encounter: Payer: Self-pay | Admitting: Internal Medicine

## 2023-04-10 ENCOUNTER — Ambulatory Visit (INDEPENDENT_AMBULATORY_CARE_PROVIDER_SITE_OTHER): Payer: Medicare HMO | Admitting: Internal Medicine

## 2023-04-10 VITALS — BP 136/80 | HR 93 | Temp 98.0°F | Resp 16 | Ht 66.5 in | Wt 171.0 lb

## 2023-04-10 DIAGNOSIS — I10 Essential (primary) hypertension: Secondary | ICD-10-CM

## 2023-04-10 DIAGNOSIS — N401 Enlarged prostate with lower urinary tract symptoms: Secondary | ICD-10-CM | POA: Diagnosis not present

## 2023-04-10 DIAGNOSIS — Z0000 Encounter for general adult medical examination without abnormal findings: Secondary | ICD-10-CM | POA: Diagnosis not present

## 2023-04-10 DIAGNOSIS — Z1211 Encounter for screening for malignant neoplasm of colon: Secondary | ICD-10-CM

## 2023-04-10 DIAGNOSIS — I700 Atherosclerosis of aorta: Secondary | ICD-10-CM

## 2023-04-10 DIAGNOSIS — N182 Chronic kidney disease, stage 2 (mild): Secondary | ICD-10-CM | POA: Diagnosis not present

## 2023-04-10 DIAGNOSIS — K219 Gastro-esophageal reflux disease without esophagitis: Secondary | ICD-10-CM

## 2023-04-10 DIAGNOSIS — Z136 Encounter for screening for cardiovascular disorders: Secondary | ICD-10-CM

## 2023-04-10 DIAGNOSIS — L97323 Non-pressure chronic ulcer of left ankle with necrosis of muscle: Secondary | ICD-10-CM

## 2023-04-10 DIAGNOSIS — Z8249 Family history of ischemic heart disease and other diseases of the circulatory system: Secondary | ICD-10-CM

## 2023-04-10 DIAGNOSIS — E039 Hypothyroidism, unspecified: Secondary | ICD-10-CM

## 2023-04-10 DIAGNOSIS — Z23 Encounter for immunization: Secondary | ICD-10-CM | POA: Diagnosis not present

## 2023-04-10 DIAGNOSIS — E559 Vitamin D deficiency, unspecified: Secondary | ICD-10-CM | POA: Diagnosis not present

## 2023-04-10 DIAGNOSIS — E1169 Type 2 diabetes mellitus with other specified complication: Secondary | ICD-10-CM

## 2023-04-10 DIAGNOSIS — E785 Hyperlipidemia, unspecified: Secondary | ICD-10-CM | POA: Diagnosis not present

## 2023-04-10 DIAGNOSIS — Z79899 Other long term (current) drug therapy: Secondary | ICD-10-CM

## 2023-04-10 DIAGNOSIS — J449 Chronic obstructive pulmonary disease, unspecified: Secondary | ICD-10-CM

## 2023-04-10 DIAGNOSIS — E1122 Type 2 diabetes mellitus with diabetic chronic kidney disease: Secondary | ICD-10-CM | POA: Diagnosis not present

## 2023-04-10 DIAGNOSIS — Z0001 Encounter for general adult medical examination with abnormal findings: Secondary | ICD-10-CM

## 2023-04-10 DIAGNOSIS — Z125 Encounter for screening for malignant neoplasm of prostate: Secondary | ICD-10-CM | POA: Diagnosis not present

## 2023-04-10 DIAGNOSIS — I872 Venous insufficiency (chronic) (peripheral): Secondary | ICD-10-CM

## 2023-04-10 NOTE — Patient Instructions (Signed)

## 2023-04-10 NOTE — Progress Notes (Addendum)
Annual Screening/Preventative Visit & Comprehensive Evaluation &  Examination   Future Appointments  Date Time Provider Department  04/10/2023                   cpe  3:00 PM Lucky Cowboy, MD [facility]  05/08/2023  2:15 PM Nadara Mustard, MD [facility]  04/28/2024                    cpe  3:00 PM Lucky Cowboy, MD [facility]             This very nice 82 y.o. MWM presents for a comprehensive evaluation and management of multiple medical co-morbidities.  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 Patient has GERD controlled on his meds. Patient is followed by Dr Lajoyce Corners  &  [facility] a slowly healing wound of his Left  LE /lateral ankle.                                     Patient  has a chronic pain syndrome due to Lumbar DDD w/ Lt sciatica & thoracic Compression fx's & was followed by Dr Thyra Breed and had tapered off of his Opioids !          HTN predates circa 2000. Patient's BP has been controlled at home.  Today's BP is at goal -  136/80. Patient denies any cardiac symptoms as chest pain, palpitations, shortness of breath, dizziness or ankle swelling.        Patient's hyperlipidemia is  not controlled with diet and he is reticent to take meds for Cholesterol. Last lipids were not at goal :  Lab Results  Component Value Date   CHOL 207 (H) 10/05/2022   HDL 36 (L) 10/05/2022   LDLCALC 140 (H) 10/05/2022   TRIG 177 (H) 10/05/2022   CHOLHDL 5.8 (H) 10/05/2022         Patient has diet controlled T2_NIDDM  (A1c 6.5%/2015) and patient denies reactive hypoglycemic symptoms, visual blurring, diabetic polys or paresthesias. Last A1c was not at goal:   Lab Results  Component Value Date   HGBA1C 6.3 (H) 10/05/2022         Finally, patient has history of Vitamin D Deficiency  ("26"/2009) and last vitamin D was low & he has stopped his supplements:   Lab Results  Component Value Date   VD25OH 57  10/05/2022       Current Outpatient Medications on File Prior to Visit  Medication Sig   Red Yeast Rice 600 MG CAPS PRN   SENOKOT Take as needed.   triamterene-hctz (MAXZIDE-25) 37.5-25  Take 1 tablet daily.     Allergies  Allergen Reactions   Feraheme [Ferumoxytol] Other (See Comments)    Swelling of lips and tongue, could not talk 30 minutes after the infusion.   Naprosyn [Naproxen] Swelling    Unsure if naprosyn caused this reaction   Latex    Levothyroxine     Lip swelling   Doxycycline Nausea Only     Past Medical History:  Diagnosis Date   Anemia    Arthritis    Celiac disease    diagnoses 06/24/12   Esophageal stricture    H/O hiatal hernia    Hiatal hernia    Hypertension    Hypothyroidism  Prediabetes    Shortness of breath    from oxycodone at times   Vitamin B12 deficiency    Vitamin D deficiency      Health Maintenance  Topic Date Due   COVID-19 Vaccine (1) Never done   Zoster Vaccines- Shingrix (1 of 2) Never done   FOOT EXAM  06/16/2017   OPHTHALMOLOGY EXAM  07/11/2017   INFLUENZA VACCINE  01/10/2021   TETANUS/TDAP  01/11/2021   URINE MICROALBUMIN  03/11/2021   HEMOGLOBIN A1C  03/30/2021   HPV VACCINES  Aged Out     Immunization History  Administered Date(s) Administered   DT 01/12/2011   Fluad Quad(high Dose  02/14/2019   Influenza, High Dose 05/20/2014, 05/26/2015, 03/20/2018   Influenza,inj,Quad  03/20/2018   Influenza  04/30/2013, 03/29/2016, 03/02/2017   Pneumococcal -13 05/20/2014   Pneumococcal -23 03/29/2016   Pneumococcal-23 05/20/2008   Zoster, Live 07/15/2014    Last Colon - 05/27/2012 - Dr Rhea Belton - aged out.  Past Surgical History:  Procedure Laterality Date   BACK SURGERY     3 diff surgeries for vertebrea broken   EYE SURGERY     bilateral cataract surgery   HERNIA REPAIR  1950's   bilateral groin as child   HIATAL HERNIA REPAIR     JOINT REPLACEMENT  04/2010   right knee replacement   TONSILLECTOMY      as child   TOTAL KNEE REVISION  11/20/2011   Procedure: TOTAL KNEE REVISION;  Surgeon: Shelda Pal, MD;  Location: [facility];  Service: Orthopedics;  Laterality: Right;  Right Total Knee Revision     Family History  Problem Relation Age of Onset   Esophageal cancer Father      Social History   Tobacco Use   Smoking status: Former    Packs/day: 1.00    Years: 20.00    Pack years: 20.00    Types: Cigarettes    Quit date: 06/27/1980    Years since quitting: 40.7   Smokeless tobacco: Never  Substance Use Topics   Alcohol use: No   Drug use: No      ROS Constitutional: Denies fever, chills, weight loss/gain, headaches, insomnia,  night sweats or change in appetite. Does c/o fatigue. Eyes: Denies redness, blurred vision, diplopia, discharge, itchy or watery eyes.  ENT: Denies discharge, congestion, post nasal drip, epistaxis, sore throat, earache, hearing loss, dental pain, Tinnitus, Vertigo, Sinus pain or snoring.  Cardio: Denies chest pain, palpitations, irregular heartbeat, syncope, dyspnea, diaphoresis, orthopnea, PND, claudication or edema Respiratory: denies cough, dyspnea, DOE, pleurisy, hoarseness, laryngitis or wheezing.  Gastrointestinal: Denies dysphagia, heartburn, reflux, water brash, pain, cramps, nausea, vomiting, bloating, diarrhea, constipation, hematemesis, melena, hematochezia, jaundice or hemorrhoids Genitourinary: Denies dysuria, frequency,discharge, hematuria or flank pain. Has urgency, nocturia x 2-3 & occasional hesitancy. Musculoskeletal: Denies arthralgia, myalgia, stiffness, Jt. Swelling, pain, limp or strain/sprain. Denies Falls. Skin: Denies puritis, rash, hives, warts, acne, eczema or change in skin lesion Neuro: No weakness, tremor, incoordination, spasms, paresthesia or pain Psychiatric: Denies confusion, memory loss or sensory loss. Denies Depression. Endocrine: Denies change in weight, skin, hair change, nocturia, and paresthesia, diabetic polys,  visual blurring or hyper / hypo glycemic episodes.  Heme/Lymph: No excessive bleeding, bruising or enlarged lymph nodes.   Physical Exam  BP 136/80   Pulse 93   Temp 98 F (36.7 C)   Resp 16   Ht 5' 6.5" (1.689 m)   Wt 171 lb (77.6 kg)   SpO2 97%  BMI 27.19 kg/m   General Appearance: Well nourished and well groomed and in no apparent distress.  Eyes: PERRLA, EOMs, conjunctiva no swelling or erythema, normal fundi and vessels. Sinuses: No frontal/maxillary tenderness ENT/Mouth: EACs patent / TMs  nl. Nares clear without erythema, swelling, mucoid exudates. Oral hygiene is good. No erythema, swelling, or exudate. Tongue normal, non-obstructing. Tonsils not swollen or erythematous. Hearing normal.  Neck: Supple, thyroid not palpable. No bruits, nodes or JVD. Respiratory: Respiratory effort normal.  BS equal and clear bilateral without rales, rhonci, wheezing or stridor. Cardio: Heart sounds are normal with regular rate and rhythm and no murmurs, rubs or gallops. Peripheral pulses are normal and equal bilaterally without edema. No aortic or femoral bruits. Chest: symmetric with normal excursions and percussion.  Abdomen: Soft, with Nl bowel sounds. Nontender, no guarding, rebound, hernias, masses, or organomegaly.  Lymphatics: Non tender without lymphadenopathy.  Musculoskeletal: Full ROM all peripheral extremities, joint stability, 5/5 strength, and normal gait. Skin:  Left ankle  wound wrapped. Warm and dry without rashes, lesions, cyanosis, clubbing or  ecchymosis. Neuro: Cranial nerves intact, reflexes equal bilaterally. Normal muscle tone, no cerebellar symptoms. Sensation intact.  Pysch: Alert and oriented X 3 with normal affect, insight and judgment appropriate.   Assessment and Plan   1. Encounter for general adult medical examination with abnormal findings   2. Essential hypertension  - EKG 12-Lead - [place], RETROPERITNL ABD,  LTD - Urinalysis, Routine w reflex  microscopic - Microalbumin / creatinine urine ratio - CBC with Differential/Platelet - COMPLETE METABOLIC PANEL WITH GFR - Magnesium - TSH  3. Hyperlipidemia associated with type 2 diabetes mellitus (HCC)  - EKG 12-Lead - [place], RETROPERITNL ABD,  LTD - Lipid panel - TSH  4. Type 2 diabetes mellitus with stage 2 chronic kidney  disease, without long-term current use of insulin (HCC)  - EKG 12-Lead - [place], RETROPERITNL ABD,  LTD - Hemoglobin A1c - Insulin, random  5. Vitamin D deficiency  - VITAMIN D 25 Hydroxy   6. Hypothyroidism  - TSH  7. Chronic obstructive pulmonary disease(HCC)   8. Gastroesophageal reflux disease  - CBC with Differential/Platelet  9. Chronic venous insufficiency   10. Benign prostatic hyperplasia with lower urinary tract symptoms  - PSA  11. Screening for colorectal cancer  - POC Hemoccult Bld/Stl   12. Prostate cancer screening  - PSA  13. Screening for heart disease  - EKG 12-Lead  14. FHx: heart disease  - EKG 12-Lead - [place], RETROPERITNL ABD,  LTD  15. Screening for AAA (aortic abdominal aneurysm)  - [place], RETROPERITNL ABD,  LTD  16. Medication management  - Urinalysis, Routine w reflex microscopic - Microalbumin / creatinine urine ratio - CBC with Differential/Platelet - COMPLETE METABOLIC PANEL WITH GFR - Magnesium - Lipid panel - TSH - Hemoglobin A1c - Insulin, random - VITAMIN D 25 Hydroxyl          Patient was counseled in prudent diet, weight control to achieve/maintain BMI less than 25, BP monitoring, regular exercise and medications as discussed.  Discussed med effects and SE's. Routine screening labs and tests as requested with regular follow-up as recommended. Over 40 minutes of exam, counseling, chart review and high complex critical decision making was performed   Marinus Maw, MD

## 2023-04-11 ENCOUNTER — Other Ambulatory Visit: Payer: Self-pay | Admitting: Internal Medicine

## 2023-04-11 LAB — CBC WITH DIFFERENTIAL/PLATELET
Absolute Lymphocytes: 2590 {cells}/uL (ref 850–3900)
Absolute Monocytes: 755 {cells}/uL (ref 200–950)
Basophils Absolute: 100 {cells}/uL (ref 0–200)
Basophils Relative: 1.2 %
Eosinophils Absolute: 473 {cells}/uL (ref 15–500)
Eosinophils Relative: 5.7 %
HCT: 34.3 % — ABNORMAL LOW (ref 38.5–50.0)
Hemoglobin: 11.3 g/dL — ABNORMAL LOW (ref 13.2–17.1)
MCH: 31 pg (ref 27.0–33.0)
MCHC: 32.9 g/dL (ref 32.0–36.0)
MCV: 94 fL (ref 80.0–100.0)
MPV: 10.1 fL (ref 7.5–12.5)
Monocytes Relative: 9.1 %
Neutro Abs: 4382 {cells}/uL (ref 1500–7800)
Neutrophils Relative %: 52.8 %
Platelets: 474 10*3/uL — ABNORMAL HIGH (ref 140–400)
RBC: 3.65 10*6/uL — ABNORMAL LOW (ref 4.20–5.80)
RDW: 14 % (ref 11.0–15.0)
Total Lymphocyte: 31.2 %
WBC: 8.3 10*3/uL (ref 3.8–10.8)

## 2023-04-11 LAB — COMPLETE METABOLIC PANEL WITH GFR
AG Ratio: 1 (calc) (ref 1.0–2.5)
ALT: 28 U/L (ref 9–46)
AST: 38 U/L — ABNORMAL HIGH (ref 10–35)
Albumin: 3.6 g/dL (ref 3.6–5.1)
Alkaline phosphatase (APISO): 110 U/L (ref 35–144)
BUN: 18 mg/dL (ref 7–25)
CO2: 30 mmol/L (ref 20–32)
Calcium: 9.2 mg/dL (ref 8.6–10.3)
Chloride: 101 mmol/L (ref 98–110)
Creat: 1.13 mg/dL (ref 0.70–1.22)
Globulin: 3.7 g/dL (ref 1.9–3.7)
Glucose, Bld: 113 mg/dL — ABNORMAL HIGH (ref 65–99)
Potassium: 4.7 mmol/L (ref 3.5–5.3)
Sodium: 139 mmol/L (ref 135–146)
Total Bilirubin: 0.5 mg/dL (ref 0.2–1.2)
Total Protein: 7.3 g/dL (ref 6.1–8.1)
eGFR: 65 mL/min/{1.73_m2} (ref >=60)

## 2023-04-11 LAB — URINALYSIS, ROUTINE W REFLEX MICROSCOPIC
Bilirubin Urine: NEGATIVE
Glucose, UA: NEGATIVE
Hgb urine dipstick: NEGATIVE
Ketones, ur: NEGATIVE
Leukocytes,Ua: NEGATIVE
Nitrite: NEGATIVE
Protein, ur: NEGATIVE
Specific Gravity, Urine: 1.011 (ref 1.001–1.035)
pH: 5.5 (ref 5.0–8.0)

## 2023-04-11 LAB — LIPID PANEL
Cholesterol: 164 mg/dL (ref <200)
HDL: 29 mg/dL — ABNORMAL LOW (ref >=40)
LDL Cholesterol (Calc): 107 mg/dL — ABNORMAL HIGH
Non-HDL Cholesterol (Calc): 135 mg/dL — ABNORMAL HIGH (ref <130)
Total CHOL/HDL Ratio: 5.7 (calc) — ABNORMAL HIGH (ref <5.0)
Triglycerides: 168 mg/dL — ABNORMAL HIGH (ref <150)

## 2023-04-11 LAB — HEMOGLOBIN A1C
Hgb A1c MFr Bld: 6.5 %{Hb} — ABNORMAL HIGH (ref <5.7)
Mean Plasma Glucose: 140 mg/dL
eAG (mmol/L): 7.7 mmol/L

## 2023-04-11 LAB — VITAMIN D 25 HYDROXY (VIT D DEFICIENCY, FRACTURES): Vit D, 25-Hydroxy: 45 ng/mL (ref 30–100)

## 2023-04-11 LAB — MICROALBUMIN / CREATININE URINE RATIO
Creatinine, Urine: 51 mg/dL (ref 20–320)
Microalb Creat Ratio: 12 mg/g{creat} (ref <30)
Microalb, Ur: 0.6 mg/dL

## 2023-04-11 LAB — PSA: PSA: 0.69 ng/mL (ref <=4.00)

## 2023-04-11 LAB — TSH: TSH: 5 m[IU]/L — ABNORMAL HIGH (ref 0.40–4.50)

## 2023-04-11 LAB — INSULIN, RANDOM: Insulin: 24.9 u[IU]/mL — ABNORMAL HIGH

## 2023-04-11 LAB — MAGNESIUM: Magnesium: 1.8 mg/dL (ref 1.5–2.5)

## 2023-04-11 NOTE — Progress Notes (Signed)
<>*<>*<>*<>*<>*<>*<>*<>*<>*<>*<>*<>*<>*<>*<>*<>*<>*<>*<>*<>*<>*<>*<>*<>*<> <>*<>*<>*<>*<>*<>*<>*<>*<>*<>*<>*<>*<>*<>*<>*<>*<>*<>*<>*<>*<>*<>*<>*<>*<>  -Test results slightly outside the reference range are not unusual. If there is anything important, I will review this with you,  otherwise it is considered normal test values.  If you have further questions,  please do not hesitate to contact me at the office or via My Chart.   <>*<>*<>*<>*<>*<>*<>*<>*<>*<>*<>*<>*<>*<>*<>*<>*<>*<>*<>*<>*<>*<>*<>*<>*<> <>*<>*<>*<>*<>*<>*<>*<>*<>*<>*<>*<>*<>*<>*<>*<>*<>*<>*<>*<>*<>*<>*<>*<>*<>  -  Mild chronic anemia is same & Stable   <>*<>*<>*<>*<>*<>*<>*<>*<>*<>*<>*<>*<>*<>*<>*<>*<>*<>*<>*<>*<>*<>*<>*<>*<> <>*<>*<>*<>*<>*<>*<>*<>*<>*<>*<>*<>*<>*<>*<>*<>*<>*<>*<>*<>*<>*<>*<>*<>*<>   -  Total  Chol = 164  - Excellent            (  Ideal  or  Goal is less than 180  !  )  But . . . .  -  Bad / Dangerous LDL  Chol = 107    -  is Elevated              (  Ideal  or  Goal is less than 70  !  )   -  So   Recommend a  STRICTER  low cholesterol diet   - Cholesterol only comes from animal sources                                                                - ie. meat, dairy, egg yolks  - Eat all the vegetables you want.   - Avoid Meat, Avoid Meat,  Avoid Meat                                                             - especially Red Meat - Beef AND Pork .  - Avoid cheese & dairy - milk & ice cream.      - Cheese is the most concentrated form of trans-fats which                                                             is the worst thing to clog up our arteries.    - Veggie cheese is OK which can be found in the fresh                                                produce section at [employer] or [employer] or [employer]  <>*<>*<>*<>*<>*<>*<>*<>*<>*<>*<>*<>*<>*<>*<>*<>*<>*<>*<>*<>*<>*<>*<>*<>*<> <>*<>*<>*<>*<>*<>*<>*<>*<>*<>*<>*<>*<>*<>*<>*<>*<>*<>*<>*<>*<>*<>*<>*<>*<>  -  TSH is  borderline elevated  which means Thyroid hormone is borderline low in blood                          So will continue to monitor closely at subsequent office visits.  <>*<>*<>*<>*<>*<>*<>*<>*<>*<>*<>*<>*<>*<>*<>*<>*<>*<>*<>*<>*<>*<>*<>*<>*<> <>*<>*<>*<>*<>*<>*<>*<>*<>*<>*<>*<>*<>*<>*<>*<>*<>*<>*<>*<>*<>*<>*<>*<>*<>  -  A1c = 6.5% - So you are NOW officially considered a Diabetic  when A1c is 6.5% or Greater   - So you need to work harder on diet    Being diabetic has a  300% increased risk for heart attack,                                                     stroke, cancer, and alzheimer- type vascular dementia.   It is very important that you work harder with diet by                                      avoiding all foods that are white except chicken, fish & calliflower.  - Avoid white rice  (brown & wild rice is OK),   - Avoid white potatoes  (sweet potatoes in moderation is OK),   White bread or wheat bread or anything made out of                                               white flour like bagels, donuts, rolls, buns, biscuits, cakes,  - pastries, cookies, pizza crust, and pasta (made from white flour & egg whites)   - vegetarian pasta or spinach or wheat pasta is OK.  - Multigrain breads like Arnold's, Pepper[employer] or   multigrain sandwich thins or high fiber breads like                                                           E[place]ka bread or "Dave's Killer" breads that are                                                                           4 to 5 grams fiber per slice !  are best.    Diet, exercise and weight loss can reverse and cure diabetes in the early stages.    - Diet, exercise and weight loss is very important in the control and prevention of   complications of diabetes which affects every system in your body, ie.   -Brain - dementia/stroke,  - eyes -  glaucoma/blindness,  - heart - heart attack/heart failure,  - kidneys - dialysis,  - stomach - gastric paralysis,  - intestines - malabsorption,  - nerves - severe painful neuritis,  - circulation - gangrene & loss of a leg(s)  - and finally  . . . . . . . . . . . . . . . . . .    - cancer and Alzheimers.  <>*<>*<>*<>*<>*<>*<>*<>*<>*<>*<>*<>*<>*<>*<>*<>*<>*<>*<>*<>*<>*<>*<>*<>*<> <>*<>*<>*<>*<>*<>*<>*<>*<>*<>*<>*<>*<>*<>*<>*<>*<>*<>*<>*<>*<>*<>*<>*<>*<>  -  PSA very low - No Prostate Cancer  - Great    !   <>*<>*<>*<>*<>*<>*<>*<>*<>*<>*<>*<>*<>*<>*<>*<>*<>*<>*<>*<>*<>*<>*<>*<>*<> <>*<>*<>*<>*<>*<>*<>*<>*<>*<>*<>*<>*<>*<>*<>*<>*<>*<>*<>*<>*<>*<>*<>*<>*<>  -   Magnesium  =  1.8  is Very  Very  low- goal is betw 2.0 - 2.5,    -  So  ..............Marland Kitchen  Recommend that you take                                                                 Magnesium 500 mg tablet 3 x / day with Meals    - also important to eat lots of  leafy green vegetables - spinach - Kale - collards   - greens - okra - asparagus - broccoli - quinoa - squash - almonds   - black, red, white beans -  peas - green beans  <>*<>*<>*<>*<>*<>*<>*<>*<>*<>*<>*<>*<>*<>*<>*<>*<>*<>*<>*<>*<>*<>*<>*<>*<> <>*<>*<>*<>*<>*<>*<>*<>*<>*<>*<>*<>*<>*<>*<>*<>*<>*<>*<>*<>*<>*<>*<>*<>*<>  -  Vitamin D = 45 - Very Low  !   - Vitamin D goal is between 70-100.   - Please make sure that you are taking your                                                        Vitamin D 10,000 units EVERY  DAY  as directed.   - It is very important as a natural anti-inflammatory and helping the                           immune system protect against viral infections, like the Covid-19    helping hair, skin, and nails, as well as reducing stroke and heart attack risk.   - It helps your bones and helps with mood.  - It also decreases numerous cancer risks so please                                                                                            take it as directed.   - Low Vit D is associated with a 200-300% higher risk for CANCER   and 200-300% higher risk for HEART   ATTACK  &  STROKE.    - It is also associated with higher death rate at younger ages,   autoimmune diseases like Rheumatoid arthritis, Lupus, Multiple Sclerosis.     - Also many other serious conditions, like depression, Alzheimer's  Dementia,  muscle aches, fatigue, fibromyalgia   <>*<>*<>*<>*<>*<>*<>*<>*<>*<>*<>*<>*<>*<>*<>*<>*<>*<>*<>*<>*<>*<>*<>*<>*<> <>*<>*<>*<>*<>*<>*<>*<>*<>*<>*<>*<>*<>*<>*<>*<>*<>*<>*<>*<>*<>*<>*<>*<>*<>  -  All Else - CBC - Kidneys - Electrolytes - Liver - Magnesium & Thyroid    - all  Normal / OK  <>*<>*<>*<>*<>*<>*<>*<>*<>*<>*<>*<>*<>*<>*<>*<>*<>*<>*<>*<>*<>*<>*<>*<>*<> <>*<>*<>*<>*<>*<>*<>*<>*<>*<>*<>*<>*<>*<>*<>*<>*<>*<>*<>*<>*<>*<>*<>*<>*<>

## 2023-04-20 DIAGNOSIS — L97929 Non-pressure chronic ulcer of unspecified part of left lower leg with unspecified severity: Secondary | ICD-10-CM | POA: Diagnosis not present

## 2023-05-02 NOTE — Progress Notes (Unsigned)
Assessment and Plan:  There are no diagnoses linked to this encounter.    Further disposition pending results of labs. Discussed med's effects and SE's.   Over 30 minutes of exam, counseling, chart review, and critical decision making was performed.   Future Appointments  Date Time Provider Department Center  05/03/2023  2:45 PM Raynelle Dick, NP [facility] None  05/08/2023  2:15 PM Nadara Mustard, MD [facility] None  07/19/2023 11:00 AM Adela Glimpse, NP [facility] None  10/16/2023 10:30 AM Lucky Cowboy, MD [facility] None  01/16/2024  2:30 PM Adela Glimpse, NP [facility] None  04/28/2024  3:00 PM Lucky Cowboy, MD [facility] None    ------------------------------------------------------------------------------------------------------------------   HPI There were no vitals taken for this visit. 82 y.o.male presents for  Past Medical History:  Diagnosis Date   Anemia    Celiac disease    diagnoses 06/24/12   Chronic ulcer of ankle (HCC)    left   Esophageal stricture    GERD (gastroesophageal reflux disease)    Hiatal hernia    Hypertension    patient states " no current HTN"   Hypothyroidism    Pre-diabetes    Prediabetes    Shortness of breath    from oxycodone at times   Vitamin B12 deficiency    Vitamin D deficiency      Allergies  Allergen Reactions   Feraheme [Ferumoxytol] Swelling and Other (See Comments)    Swelling of lips and tongue, could not talk 30 minutes after the infusion.   Latex Other (See Comments)    Latex butterfly bandages tore off the skin and caused terrible blisters   Sulfa Antibiotics Hives    Severe itching, blisters   Wound Dressing Adhesive Other (See Comments)    Latex butterfly bandages tore off the skin and caused terrible blisters   Levothyroxine Swelling and Other (See Comments)    Lip swelling   Doxycycline Nausea Only    Current Outpatient Medications on File Prior to Visit  Medication Sig   Ascorbic Acid (VITAMIN  C) 1000 MG tablet Take 1,000 mg by mouth daily.   betamethasone valerate ointment (VALISONE) 0.1 % Apply 1 Application topically 2 (two) times daily. (Patient taking differently: Apply 1 Application topically daily.)   Cholecalciferol (VITAMIN D3) 125 MCG (5000 UT) TABS Take 10,000 Units by mouth daily.   clotrimazole-betamethasone (LOTRISONE) cream Apply 1 Application topically daily as needed (Rash).   collagenase (SANTYL) 250 UNIT/GM ointment Apply 1 Application topically daily. 7x 30 g tubes   hydrOXYzine (ATARAX) 25 MG tablet TAKE 1 TABLET 3 x / day  AS NEEDED FOR ANXIETY OR SLEEP                                                             /                                                                   TAKE  BY                                                 MOUTH   leptospermum manuka honey (MEDIHONEY) PSTE paste Apply 1 Application topically daily.   NON FORMULARY Take 2 capsules by mouth every morning. Thyroid Complete   omeprazole (PRILOSEC OTC) 20 MG tablet Take 20 mg by mouth daily before breakfast.   senna-docusate (SENOKOT-S) 8.6-50 MG tablet Take 2 tablets by mouth 2 (two) times daily.   torsemide (DEMADEX) 20 MG tablet TAKE 1 TABLET DAILY FOR FLUID RETENTION / LEG SWELLING   triamcinolone cream (KENALOG) 0.1 % APPLY TO THE LEFT LEG UNDER THE WRAP DAILY WHERE THERE IS ITCHING OR RASH X 2 WEEKS OR UNTIL HEALED   triamcinolone cream (KENALOG) 0.1 % Apply 1 Application topically 2 (two) times daily.   zinc gluconate 50 MG tablet Take 50 mg by mouth daily.   No current facility-administered medications on file prior to visit.    ROS: all negative except above.   Physical Exam:  There were no vitals taken for this visit.  General Appearance: Well nourished, in no apparent distress. Eyes: PERRLA, EOMs, conjunctiva no swelling or erythema Sinuses: No Frontal/maxillary tenderness ENT/Mouth: Ext aud canals clear, TMs without erythema,  bulging. No erythema, swelling, or exudate on post pharynx.  Tonsils not swollen or erythematous. Hearing normal.  Neck: Supple, thyroid normal.  Respiratory: Respiratory effort normal, BS equal bilaterally without rales, rhonchi, wheezing or stridor.  Cardio: RRR with no MRGs. Brisk peripheral pulses without edema.  Abdomen: Soft, + BS.  Non tender, no guarding, rebound, hernias, masses. Lymphatics: Non tender without lymphadenopathy.  Musculoskeletal: Full ROM, 5/5 strength, normal gait.  Skin: Warm, dry without rashes, lesions, ecchymosis.  Neuro: Cranial nerves intact. Normal muscle tone, no cerebellar symptoms. Sensation intact.  Psych: Awake and oriented X 3, normal affect, Insight and Judgment appropriate.     Raynelle Dick, NP 12:29 PM [facility]

## 2023-05-03 ENCOUNTER — Encounter: Payer: Self-pay | Admitting: Nurse Practitioner

## 2023-05-03 ENCOUNTER — Ambulatory Visit (INDEPENDENT_AMBULATORY_CARE_PROVIDER_SITE_OTHER): Payer: Medicare HMO | Admitting: Nurse Practitioner

## 2023-05-03 VITALS — BP 102/58 | HR 92 | Temp 97.9°F | Ht 66.5 in | Wt 174.4 lb

## 2023-05-03 DIAGNOSIS — L308 Other specified dermatitis: Secondary | ICD-10-CM | POA: Diagnosis not present

## 2023-05-03 DIAGNOSIS — I872 Venous insufficiency (chronic) (peripheral): Secondary | ICD-10-CM

## 2023-05-03 DIAGNOSIS — L97323 Non-pressure chronic ulcer of left ankle with necrosis of muscle: Secondary | ICD-10-CM

## 2023-05-03 DIAGNOSIS — R609 Edema, unspecified: Secondary | ICD-10-CM

## 2023-05-03 DIAGNOSIS — I10 Essential (primary) hypertension: Secondary | ICD-10-CM | POA: Diagnosis not present

## 2023-05-03 DIAGNOSIS — J449 Chronic obstructive pulmonary disease, unspecified: Secondary | ICD-10-CM

## 2023-05-03 DIAGNOSIS — E1169 Type 2 diabetes mellitus with other specified complication: Secondary | ICD-10-CM

## 2023-05-03 DIAGNOSIS — E1122 Type 2 diabetes mellitus with diabetic chronic kidney disease: Secondary | ICD-10-CM

## 2023-05-03 DIAGNOSIS — E039 Hypothyroidism, unspecified: Secondary | ICD-10-CM

## 2023-05-03 MED ORDER — DEXAMETHASONE SODIUM PHOSPHATE 10 MG/ML IJ SOLN
10.0000 mg | Freq: Once | INTRAMUSCULAR | Status: AC
  Administered 2023-05-03: 10 mg via INTRAMUSCULAR

## 2023-05-03 MED ORDER — TORSEMIDE 20 MG PO TABS: ORAL_TABLET | ORAL | 3 refills | Status: DC

## 2023-05-03 NOTE — Patient Instructions (Addendum)
Increase Torsemide 20 mg to 2 tabs daily Start using NoSalt instead of salt for potassium Elevate legs above heart as much as possible  Use Triamcinolone cream to arms at night- wrap with saran wrap and ace bandage and remove in the am.  Referral placed to dermatology  Follow up in 10 days to reevaluate rash, edema and check potassium  Edema  Edema is an abnormal buildup of fluids in the body tissues and under the skin. Swelling of the legs, feet, and ankles is a common symptom that becomes more likely as you get older. Swelling is also common in looser tissues, such as around the eyes. Pressing on the area may make a temporary dent in your skin (pitting edema). This fluid may also accumulate in your lungs (pulmonary edema). There are many possible causes of edema. Eating too much salt (sodium) and being on your feet or sitting for a long time can cause edema in your legs, feet, and ankles. Common causes of edema include: Certain medical conditions, such as heart failure, liver or kidney disease, and cancer. Weak leg blood vessels. An injury. Pregnancy. Medicines. Being obese. Low protein levels in the blood. Hot weather may make edema worse. Edema is usually painless. Your skin may look swollen or shiny. Follow these instructions at home: Medicines Take over-the-counter and prescription medicines only as told by your health care provider. Your health care provider may prescribe a medicine to help your body get rid of extra water (diuretic). Take this medicine if you are told to take it. Eating and drinking Eat a low-salt (low-sodium) diet to reduce fluid as told by your health care provider. Sometimes, eating less salt may reduce swelling. Depending on the cause of your swelling, you may need to limit how much fluid you drink (fluid restriction). General instructions Raise (elevate) the injured area above the level of your heart while you are sitting or lying down. Do not sit still or  stand for long periods of time. Do not wear tight clothing. Do not wear garters on your upper legs. Exercise your legs to get your circulation going. This helps to move the fluid back into your blood vessels, and it may help the swelling go down. Wear compression stockings as told by your health care provider. These stockings help to prevent blood clots and reduce swelling in your legs. It is important that these are the correct size. These stockings should be prescribed by your health care provider to prevent possible injuries. If elastic bandages or wraps are recommended, use them as told by your health care provider. Contact a health care provider if: Your edema does not get better with treatment. You have heart, liver, or kidney disease and have symptoms of edema. You have sudden and unexplained weight gain. Get help right away if: You develop shortness of breath or chest pain. You cannot breathe when you lie down. You develop pain, redness, or warmth in the swollen areas. You have heart, liver, or kidney disease and suddenly get edema. You have a fever and your symptoms suddenly get worse. These symptoms may be an emergency. Get help right away. Call 911. Do not wait to see if the symptoms will go away. Do not drive yourself to the hospital. Summary Edema is an abnormal buildup of fluids in the body tissues and under the skin. Eating too much salt (sodium)and being on your feet or sitting for a long time can cause edema in your legs, feet, and ankles. Raise (elevate) the  injured area above the level of your heart while you are sitting or lying down. Follow your health care provider's instructions about diet and how much fluid you can drink. This information is not intended to replace advice given to you by your health care provider. Make sure you discuss any questions you have with your health care provider. Document Revised: 01/31/2021 Document Reviewed: 01/31/2021 Elsevier Patient  Education  2024 [employer].

## 2023-05-04 LAB — BASIC METABOLIC PANEL WITH GFR
BUN/Creatinine Ratio: 14 (calc) (ref 6–22)
BUN: 19 mg/dL (ref 7–25)
CO2: 29 mmol/L (ref 20–32)
Calcium: 9.5 mg/dL (ref 8.6–10.3)
Chloride: 102 mmol/L (ref 98–110)
Creat: 1.36 mg/dL — ABNORMAL HIGH (ref 0.70–1.22)
Glucose, Bld: 94 mg/dL (ref 65–99)
Potassium: 4.6 mmol/L (ref 3.5–5.3)
Sodium: 139 mmol/L (ref 135–146)
eGFR: 52 mL/min/{1.73_m2} — ABNORMAL LOW (ref >=60)

## 2023-05-07 DIAGNOSIS — L308 Other specified dermatitis: Secondary | ICD-10-CM | POA: Diagnosis not present

## 2023-05-07 DIAGNOSIS — L309 Dermatitis, unspecified: Secondary | ICD-10-CM | POA: Diagnosis not present

## 2023-05-08 ENCOUNTER — Ambulatory Visit (INDEPENDENT_AMBULATORY_CARE_PROVIDER_SITE_OTHER): Payer: Medicare HMO | Admitting: Orthopedic Surgery

## 2023-05-08 DIAGNOSIS — L97323 Non-pressure chronic ulcer of left ankle with necrosis of muscle: Secondary | ICD-10-CM

## 2023-05-08 DIAGNOSIS — Z945 Skin transplant status: Secondary | ICD-10-CM | POA: Diagnosis not present

## 2023-05-15 ENCOUNTER — Ambulatory Visit: Payer: Medicare HMO | Admitting: Nurse Practitioner

## 2023-05-16 ENCOUNTER — Encounter: Payer: Self-pay | Admitting: Orthopedic Surgery

## 2023-05-16 NOTE — Progress Notes (Signed)
Office Visit Note   Patient: [name] Fernandez           Date of Birth: 09-May-1941           MRN: [id number] Visit Date: 05/08/2023              Requested by: Lucky Cowboy, MD [address] [place],  [place] [address] PCP: Lucky Cowboy, MD  Chief Complaint  Patient presents with   Left Ankle - Follow-up    01/12/23 left ankle deb      HPI: Patient is an 82 year old gentleman who is 4 months status post left ankle debridement and tissue graft.  Patient is currently using Vashe cleansing daily.  He is unable to get a sock on at this time.  Patient states he has developed a systemic rash.  Assessment & Plan: Visit Diagnoses:  1. Ankle ulcer, left, with necrosis of muscle (HCC)   2. H/O skin graft     Plan: Patient will follow-up with his primary care physician for this systemic rash.  He will continue using Vashe for wound cleansing.  Follow-Up Instructions: Return in about 2 months (around 07/08/2023).   Ortho Exam  Patient is alert, oriented, no adenopathy, well-dressed, normal affect, normal respiratory effort. Examination patient's ankle has shown excellent improvement.  The wound measures 6 x 6 cm it is flatter with decreased fibrinous exudative tissue.  No drainage no cellulitis.  Patient has a itching rash on his arms and legs there is no rash on the trunk.  Patient states that dermatology stated this is a reaction to medicine.  Patient states the only changes medicine and is to increase his fluid pill to 40 mg.  Imaging: No results found.   Labs: Lab Results  Component Value Date   HGBA1C 6.5 (H) 04/10/2023   HGBA1C 6.3 (H) 10/05/2022   HGBA1C 6.4 (H) 04/04/2022   ESRSEDRATE 34 (H) 01/15/2023   ESRSEDRATE 32 (H) 06/15/2022   ESRSEDRATE 40 (H) 06/14/2022   CRP 0.7 01/15/2023   CRP 0.8 06/15/2022   CRP <0.5 06/14/2022   LABURIC 7.8 05/20/2014   LABURIC 6.8 05/20/2013   REPTSTATUS 01/17/2023 FINAL 01/12/2023   GRAMSTAIN NO WBC SEEN NO  ORGANISMS SEEN  01/12/2023   CULT  01/12/2023    No growth aerobically or anaerobically. Performed at [facility], [address], [place], [place] [address]    LABORGA PSEUDOMONAS AERUGINOSA 11/10/2022   LABORGA ENTEROCOCCUS FAECALIS 11/10/2022     Lab Results  Component Value Date   ALBUMIN 2.9 (L) 06/16/2022   ALBUMIN 3.8 06/14/2022   ALBUMIN 4.0 09/25/2016   PREALBUMIN 18 06/14/2022    Lab Results  Component Value Date   MG 1.8 04/10/2023   MG 1.8 06/19/2022   MG 1.9 04/04/2022   Lab Results  Component Value Date   VD25OH 45 04/10/2023   VD25OH 57 10/05/2022   VD25OH 59 04/04/2022    Lab Results  Component Value Date   PREALBUMIN 18 06/14/2022      Latest Ref Rng & Units 04/10/2023    3:22 PM 01/15/2023    4:53 PM 01/12/2023    8:56 AM  CBC EXTENDED  WBC 3.8 - 10.8 Thousand/uL 8.3  8.9  8.1   RBC 4.20 - 5.80 Million/uL 3.65  3.55  3.59   Hemoglobin 13.2 - 17.1 g/dL 82.9  56.2  13.0   HCT 38.5 - 50.0 % 34.3  33.0  33.5   Platelets 140 - 400  Thousand/uL 474  340  356   NEUT# 1,500 - 7,800 cells/uL 4,382  4.8    Lymph# 0.7 - 4.0 K/uL  2.3       There is no height or weight on file to calculate BMI.  Orders:  No orders of the defined types were placed in this encounter.  No orders of the defined types were placed in this encounter.    Procedures: No procedures performed  Clinical Data: No additional findings.  ROS:  All other systems negative, except as noted in the HPI. Review of Systems  Objective: Vital Signs: There were no vitals taken for this visit.  Specialty Comments:  No specialty comments available.  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) 06/16/2022   Osteomyelitis (HCC)  06/16/2022   Tenosynovitis of tibialis posterior tendon 06/15/2022   Wound infection 06/15/2022   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   Anemia 03/13/2019   Chronic venous insufficiency 03/12/2019   CKD (chronic kidney disease) stage 2, 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/2015   COPD (chronic obstructive pulmonary disease) (HCC) 12/08/2015   Chronic pain syndrome 05/26/2015   DDD (degenerative disc disease), lumbar 05/26/2015   Medication management 05/26/2015   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   Lumbago with sciatica 08/06/2014   Hyperlipidemia 08/20/2013   Hypertension    Abnormal glucose    Vitamin D deficiency    Arthritis    Vitamin B12 deficiency    Celiac disease 06/19/2012   GERD (gastroesophageal reflux disease) 04/30/2012   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 tube) procedure 04/30/2012   Hypothyroidism 04/30/2012   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   Past Medical History:  Diagnosis Date   Anemia    Celiac disease    diagnoses 06/24/12   Chronic ulcer of ankle (HCC)    left   Esophageal stricture    GERD (gastroesophageal reflux disease)    Hiatal hernia    Hypertension    patient states " no current HTN"   Hypothyroidism    Pre-diabetes    Prediabetes    Shortness of breath    from oxycodone at times   Vitamin B12 deficiency    Vitamin D deficiency     Family History  Problem Relation Age of Onset   Esophageal cancer Father     Past Surgical History:  Procedure Laterality Date   APPENDECTOMY     APPLICATION OF WOUND VAC Left 01/12/2023   Procedure: APPLICATION OF WOUND VAC;  Surgeon: Nadara Mustard, MD;  Location: [facility] OR;  Service: Orthopedics;  Laterality: Left;   BACK SURGERY     3 diff surgeries for vertebrea broken   EYE SURGERY     bilateral cataract surgery   HERNIA REPAIR  02/10/1949   bilateral groin as child   HIATAL HERNIA REPAIR     I & D EXTREMITY Left 06/16/2022   Procedure: IRRIGATION AND DEBRIDEMENT ANKLE;  Surgeon: Nadara Mustard, MD;  Location: [facility] OR;  Service: Orthopedics;  Laterality: Left;   I & D EXTREMITY Left 07/21/2022   Procedure: LEFT ANKLE DEBRIDEMENT;  Surgeon: Nadara Mustard, MD;  Location: [facility] OR;  Service: Orthopedics;  Laterality: Left;   I & D EXTREMITY Left 11/10/2022   Procedure: LEFT ANKLE DEBRIDEMENT;  Surgeon: Nadara Mustard, MD;  Location: [facility] OR;  Service: Orthopedics;  Laterality: Left;   I & D EXTREMITY Left 01/12/2023   Procedure: LEFT ANKLE DEBRIDEMENT;  Surgeon: Nadara Mustard, MD;  Location: [facility] OR;  Service: Orthopedics;  Laterality: Left;   JOINT REPLACEMENT  04/12/2010   right knee replacement   TONSILLECTOMY     as child   TOTAL KNEE REVISION  11/20/2011   Procedure: TOTAL KNEE REVISION;  Surgeon: Shelda Pal, MD;  Location: [facility] ORS;  Service: Orthopedics;  Laterality: Right;  Right Total Knee Revision   Social History   Occupational History   Occupation: Retired    Associate Professor: [employer]  Tobacco Use   Smoking status: Former    Current packs/day: 0.00    Average packs/day: 1 pack/day for 20.0 years (20.0 ttl pk-yrs)    Types: Cigarettes    Start date: 06/27/1960    Quit date: 06/27/1980    Years since quitting: 42.9    Passive exposure: Past   Smokeless tobacco: Never  Vaping Use   Vaping status: Never Used  Substance and Sexual Activity   Alcohol use: No   Drug use: No   Sexual activity: Yes

## 2023-05-22 DIAGNOSIS — L2989 Other pruritus: Secondary | ICD-10-CM | POA: Diagnosis not present

## 2023-05-22 DIAGNOSIS — L270 Generalized skin eruption due to drugs and medicaments taken internally: Secondary | ICD-10-CM | POA: Diagnosis not present

## 2023-06-08 IMAGING — DX DG HUMERUS 2V *R*
3 series · 3 of 3 positions shown · non-contrast
Comparison: None Available.

CLINICAL DATA: fall and pain at the right shoulder and right arm

EXAM:
RIGHT HUMERUS - 2+ VIEW; RIGHT SHOULDER - 2+ VIEW

[humerus ap (1 of 2)]
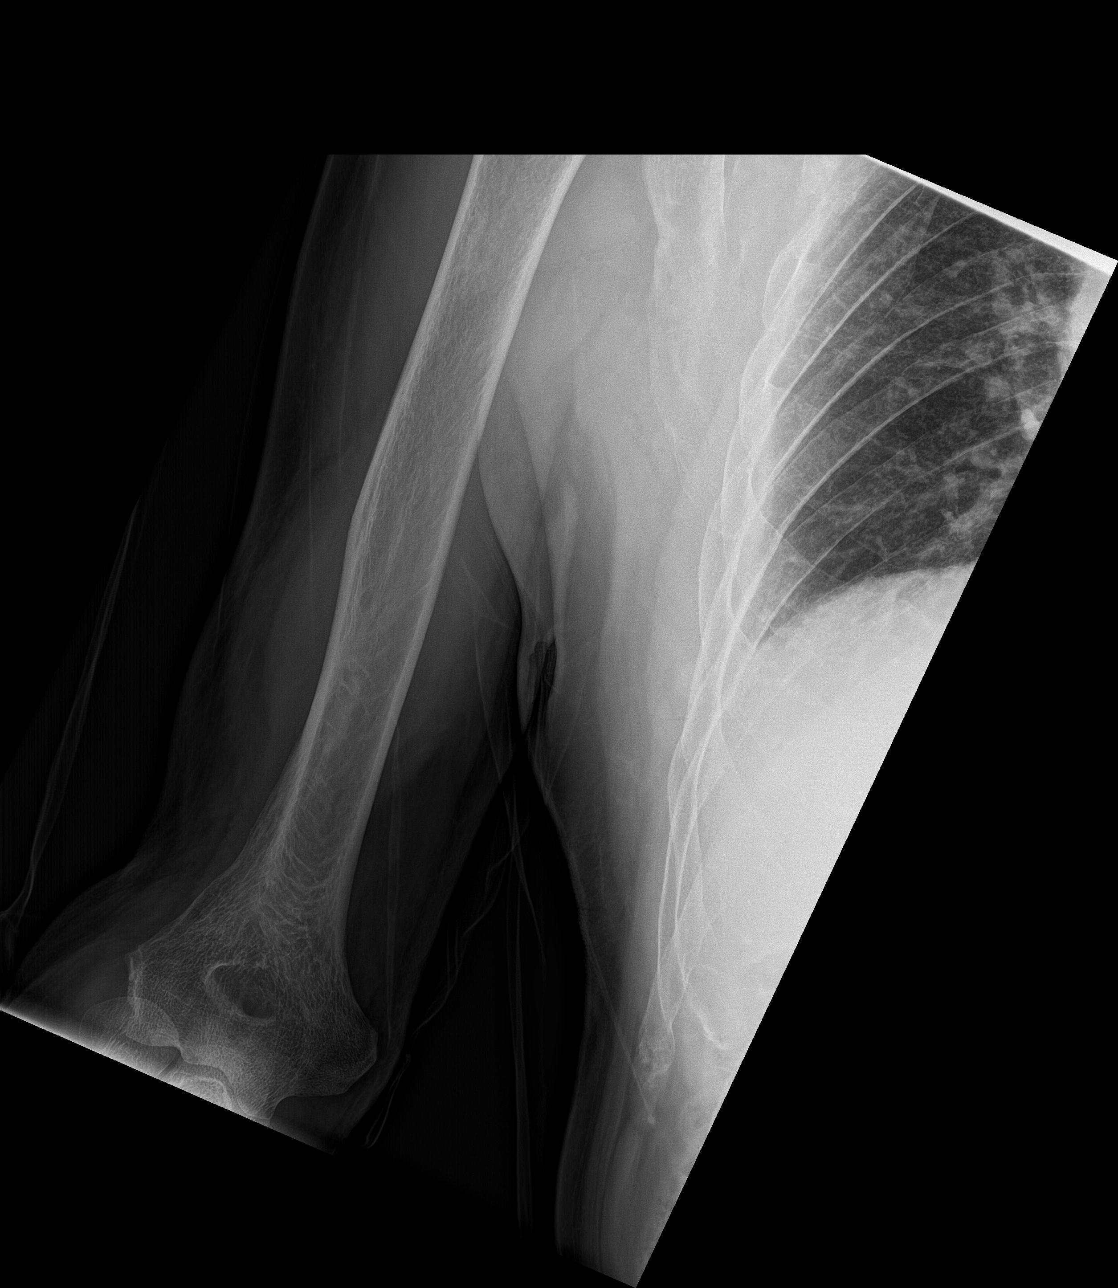

[humerus lat]
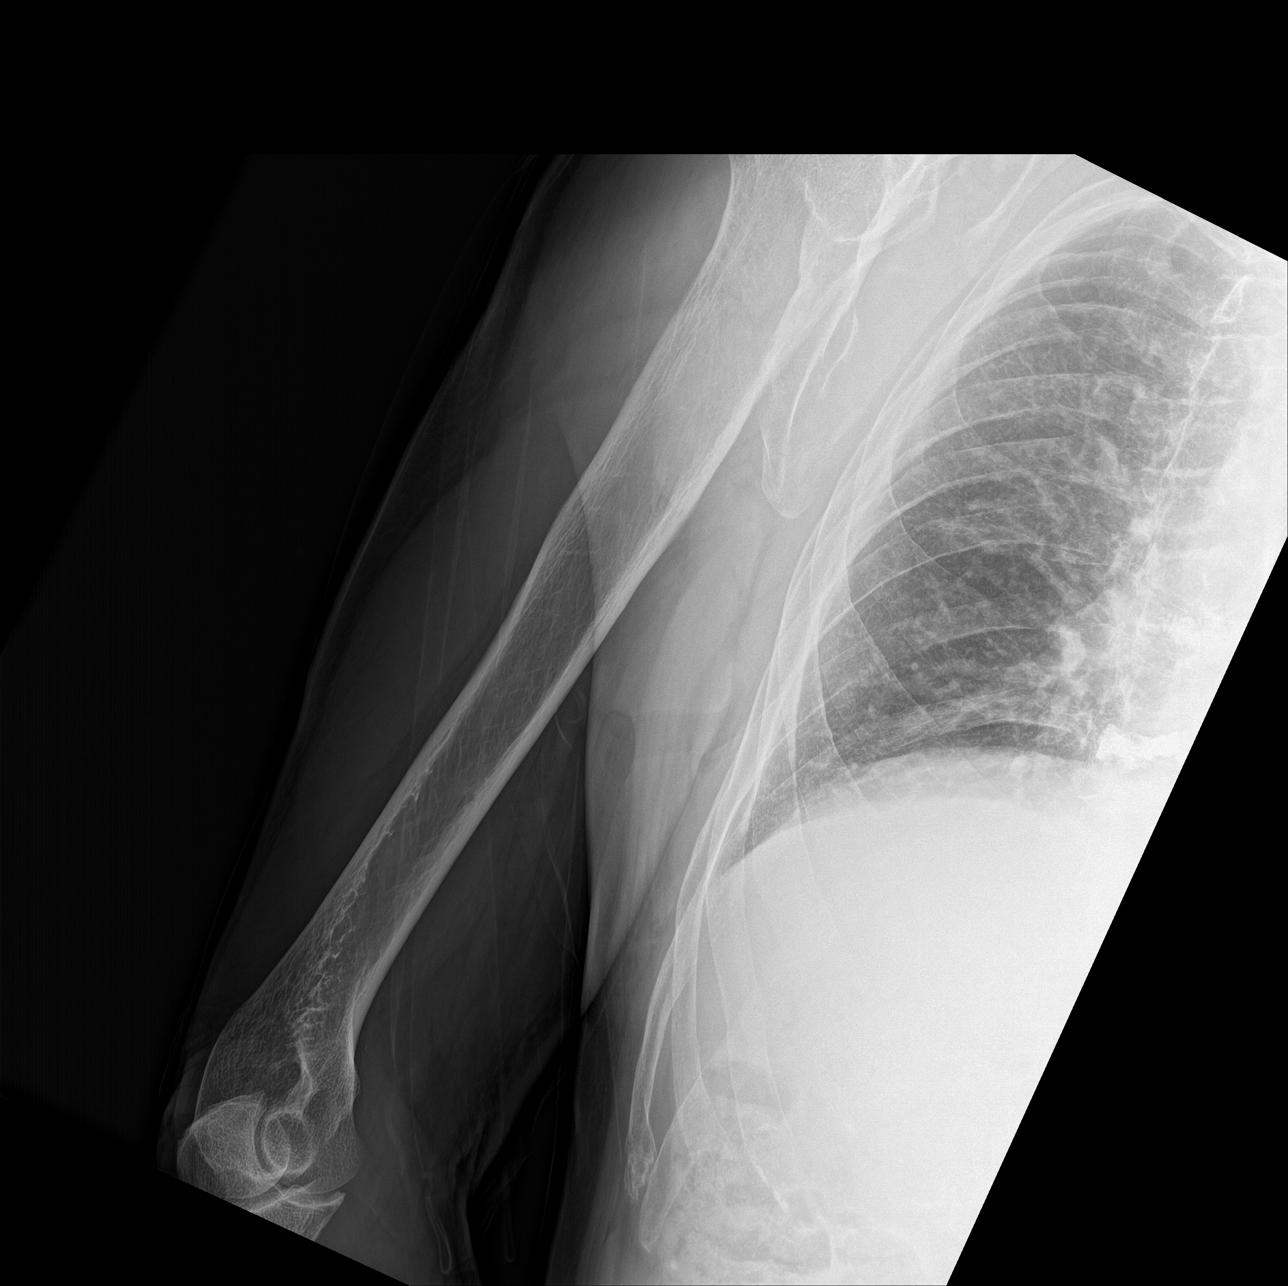

[humerus ap (2 of 2)]
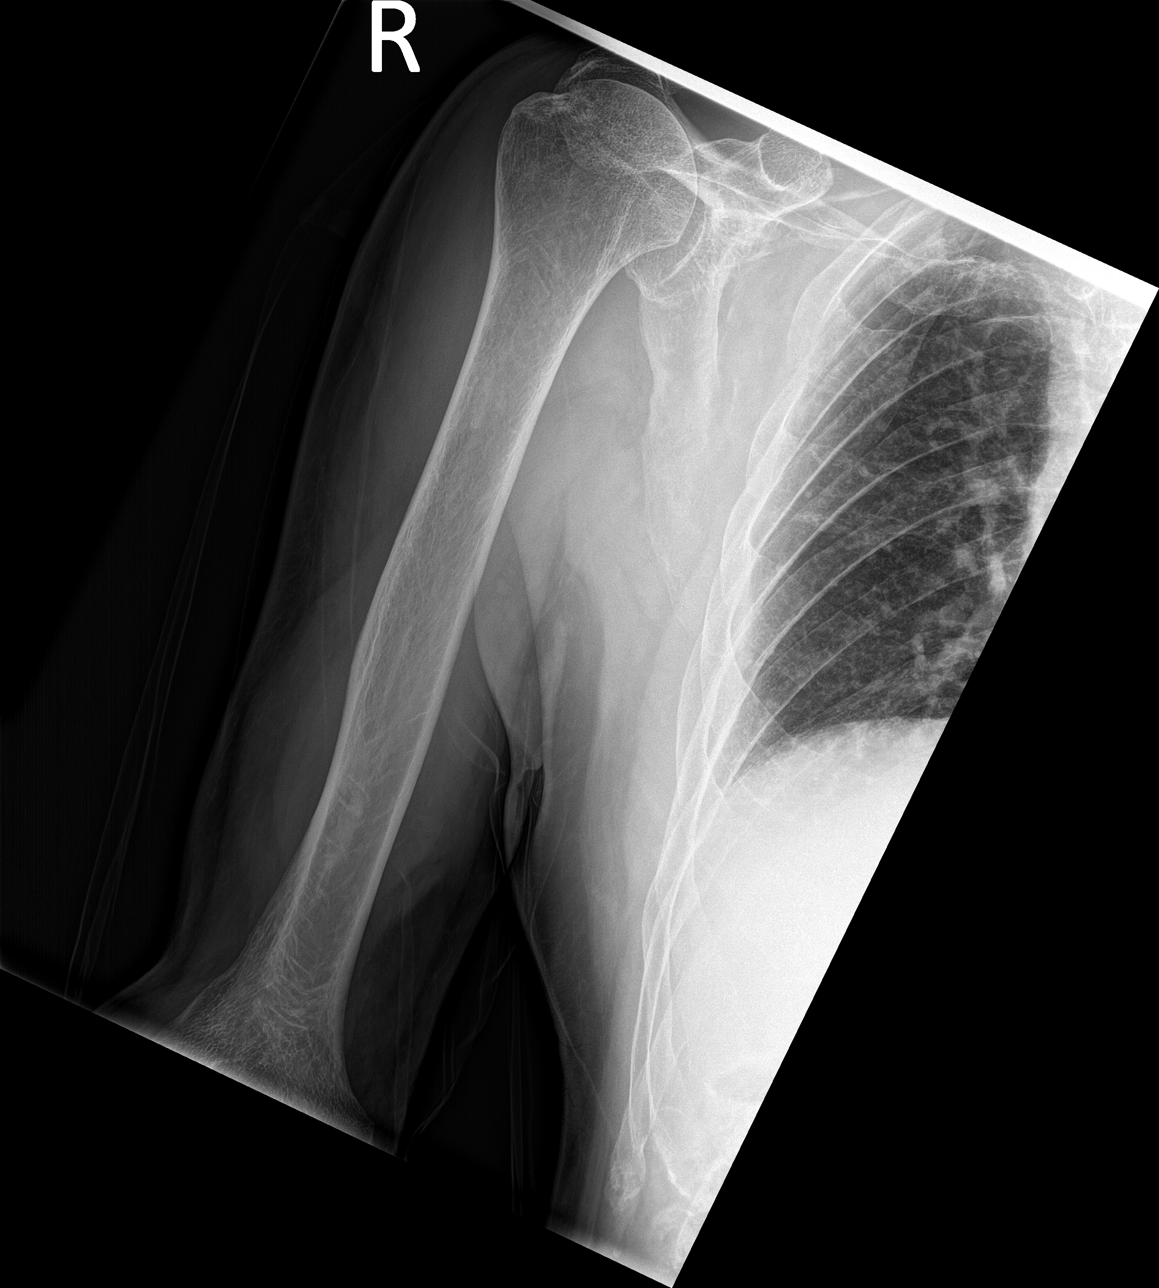

[3 of 3 positions shown; findings below may reference images not displayed]

FINDINGS: There is no evidence of fracture or other focal bone lesions. Soft
tissues are unremarkable. Osteopenia. Mild arthrosis at the shoulder
joint.
IMPRESSION: No fracture or dislocation. Mild arthrosis at the right shoulder
joint. Osteopenia.

## 2023-06-08 IMAGING — DX DG SHOULDER 2+V*R*
3 series · 3 of 3 positions shown · non-contrast
Comparison: None Available.

CLINICAL DATA: fall and pain at the right shoulder and right arm

EXAM:
RIGHT HUMERUS - 2+ VIEW; RIGHT SHOULDER - 2+ VIEW

[shoulder grashey]
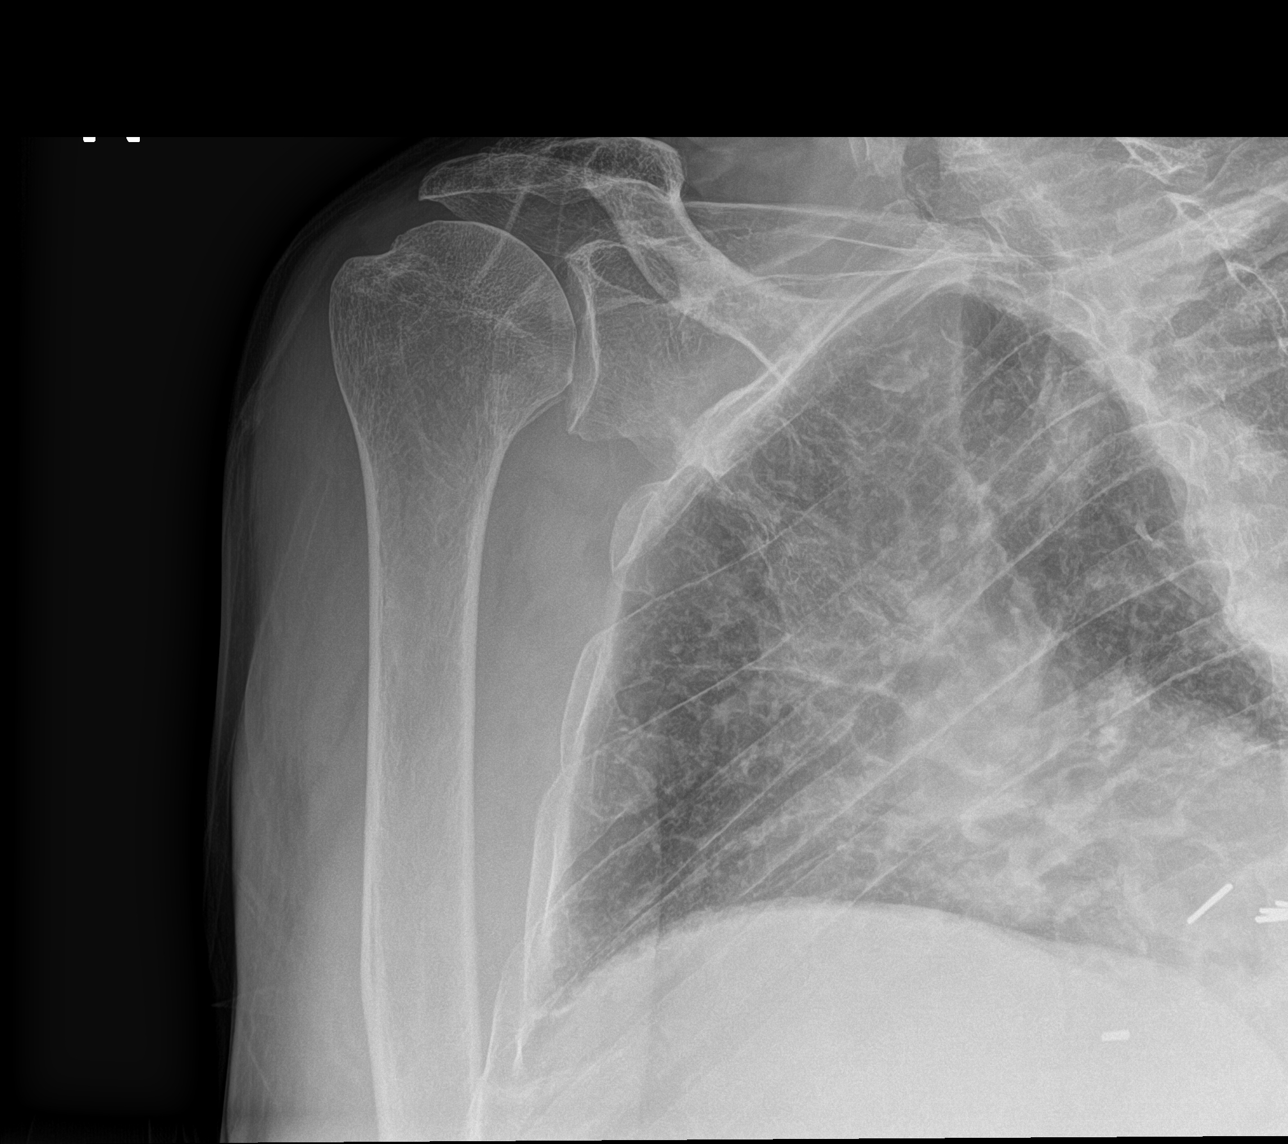

[shoulder y view]
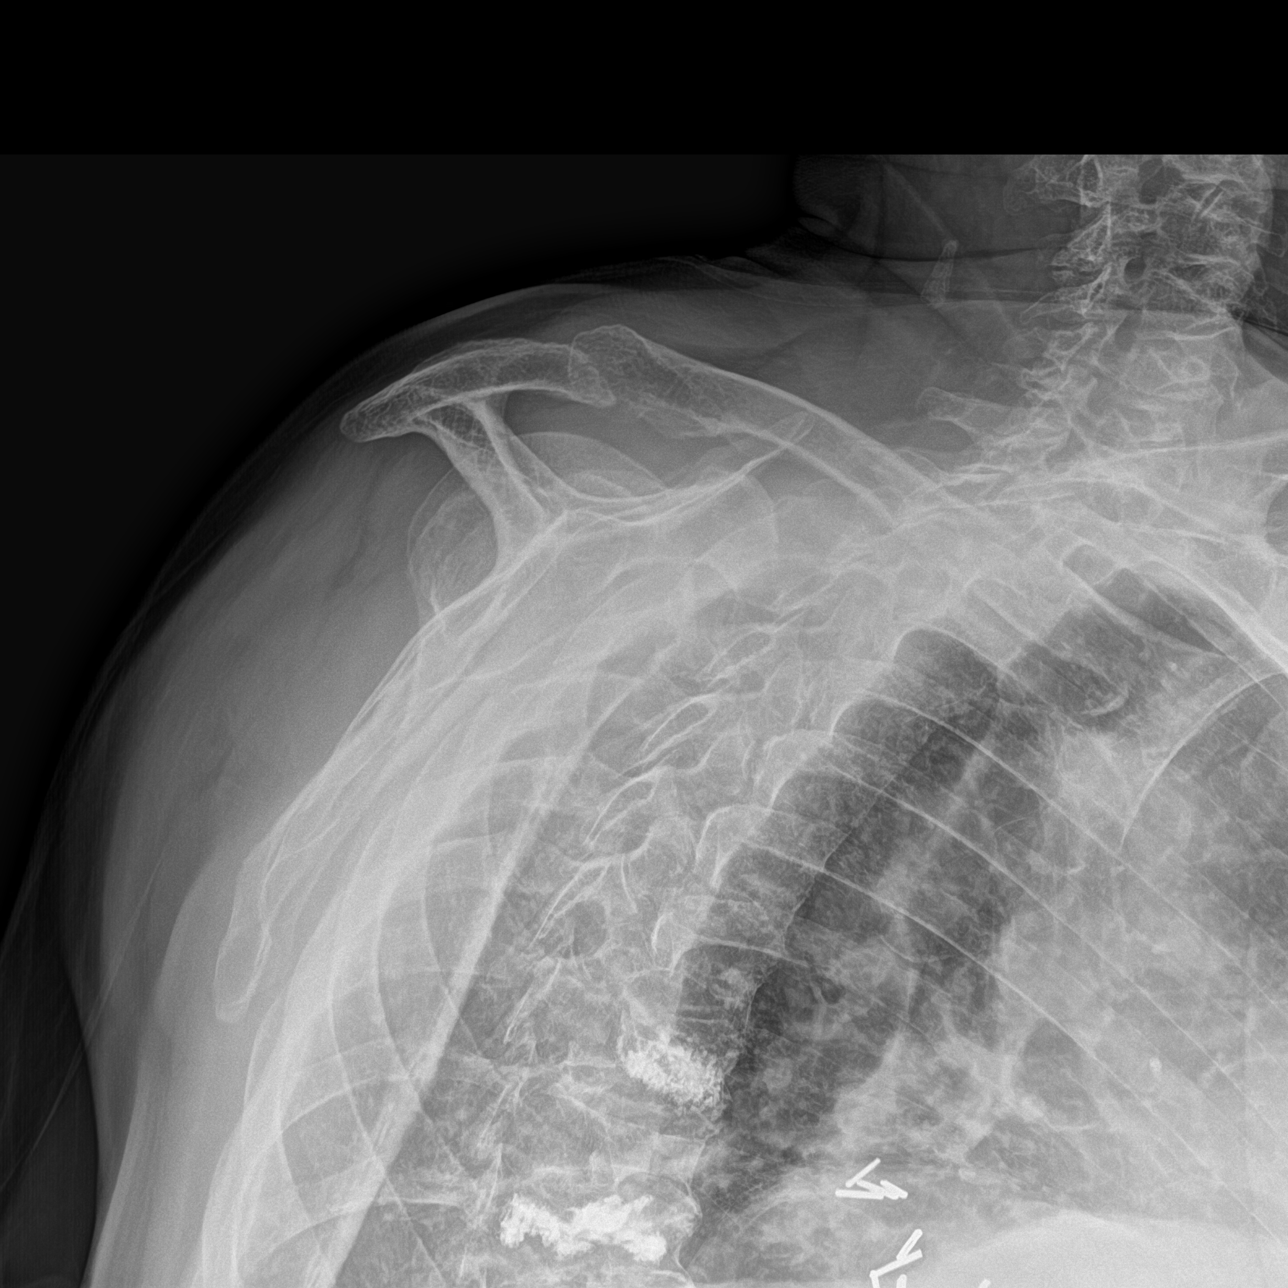

[shoulder axillary]
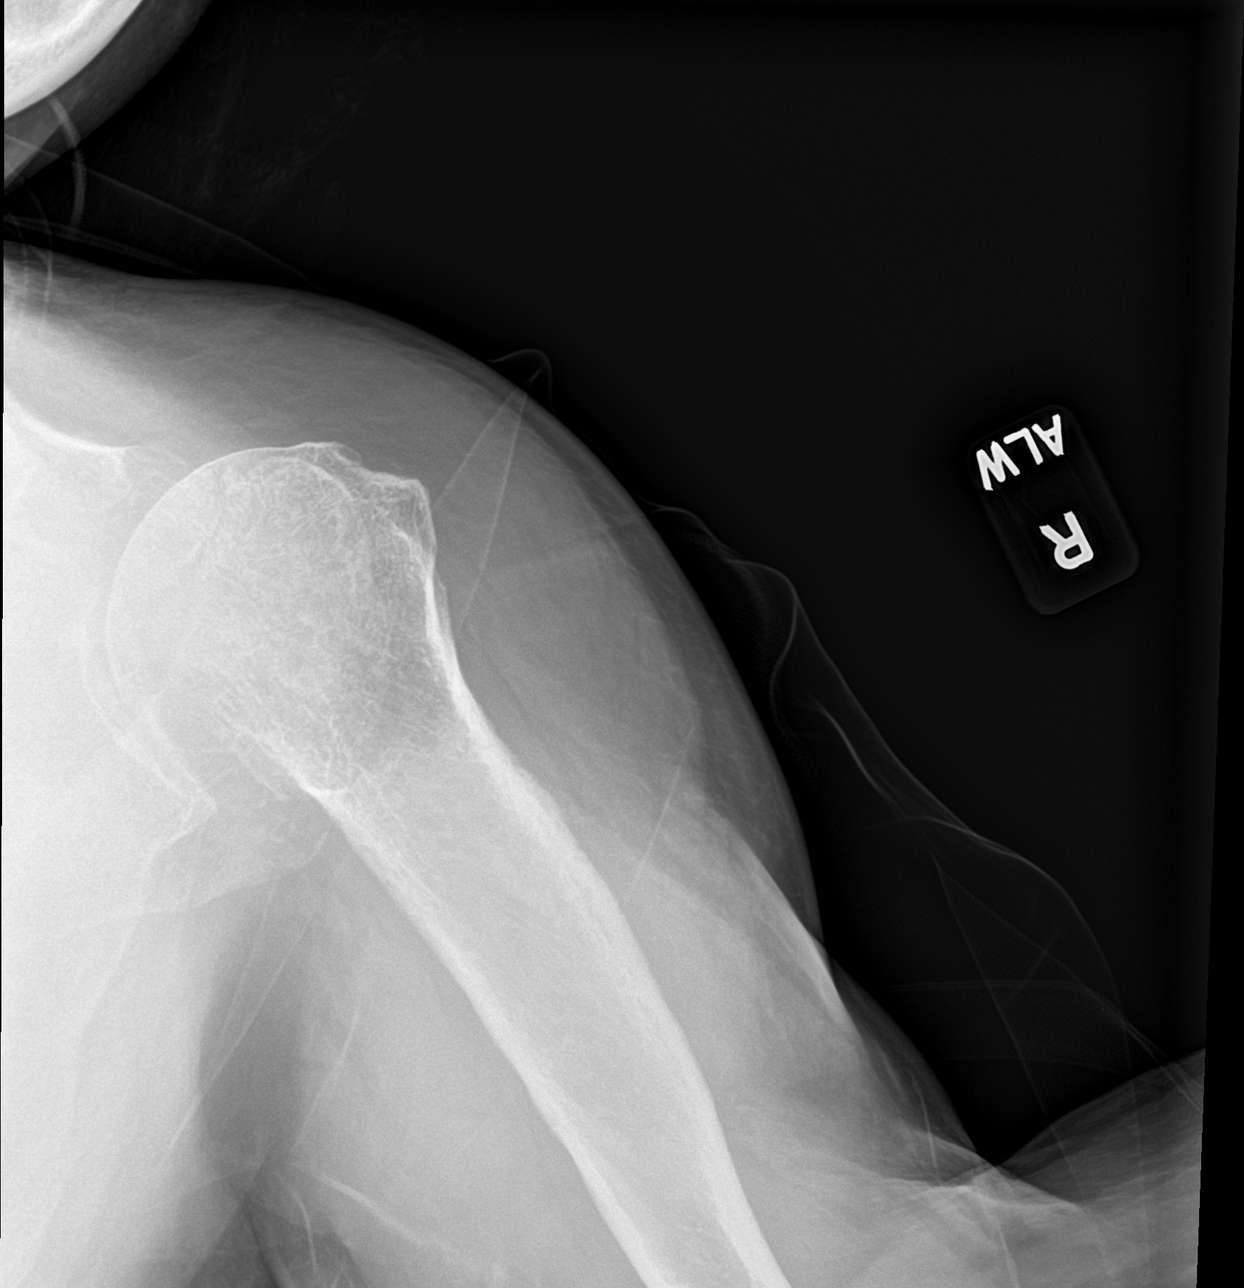

[3 of 3 positions shown; findings below may reference images not displayed]

FINDINGS: There is no evidence of fracture or other focal bone lesions. Soft
tissues are unremarkable. Osteopenia. Mild arthrosis at the shoulder
joint.
IMPRESSION: No fracture or dislocation. Mild arthrosis at the right shoulder
joint. Osteopenia.

## 2023-06-12 ENCOUNTER — Encounter: Payer: Medicare HMO | Admitting: Orthopedic Surgery

## 2023-06-12 ENCOUNTER — Ambulatory Visit (INDEPENDENT_AMBULATORY_CARE_PROVIDER_SITE_OTHER): Payer: Medicare HMO | Admitting: Orthopedic Surgery

## 2023-06-12 DIAGNOSIS — L97323 Non-pressure chronic ulcer of left ankle with necrosis of muscle: Secondary | ICD-10-CM | POA: Diagnosis not present

## 2023-06-14 DIAGNOSIS — L97929 Non-pressure chronic ulcer of unspecified part of left lower leg with unspecified severity: Secondary | ICD-10-CM | POA: Diagnosis not present

## 2023-06-18 ENCOUNTER — Encounter: Payer: Self-pay | Admitting: Orthopedic Surgery

## 2023-06-18 NOTE — Progress Notes (Signed)
 Office Visit Note   Patient: [name] Fernandez           Date of Birth: 06/20/1940           MRN: [id number] Visit Date: 06/12/2023              Requested by: Tonita Fallow, MD [address] [place],  [place] [address] PCP: Tonita Fallow, MD  Chief Complaint  Patient presents with   Left Ankle - Follow-up      HPI: Patient is an 83 year old gentleman who is seen in follow-up for chronic lateral left ankle ulcer.  Patient is currently using Vashe and daily dressing changes.  Patient states that this seems to be the best wound care treatment that he is used.  Assessment & Plan: Visit Diagnoses:  1. Ankle ulcer, left, with necrosis of muscle (HCC)     Plan: Continue current wound care continue compression and elevation.  Follow-Up Instructions: Return in about 4 weeks (around 07/10/2023).   Ortho Exam  Patient is alert, oriented, no adenopathy, well-dressed, normal affect, normal respiratory effort. Examination the wound bed has healthy granulation tissue it measures 5 x 6 cm.  The wound is flat.  There is also a distal wound that measures 1 x 2 cm.  There is no cellulitis no signs of infection.  Imaging: No results found.   Labs: Lab Results  Component Value Date   HGBA1C 6.5 (H) 04/10/2023   HGBA1C 6.3 (H) 10/05/2022   HGBA1C 6.4 (H) 04/04/2022   ESRSEDRATE 34 (H) 01/15/2023   ESRSEDRATE 32 (H) 06/15/2022   ESRSEDRATE 40 (H) 06/14/2022   CRP 0.7 01/15/2023   CRP 0.8 06/15/2022   CRP <0.5 06/14/2022   LABURIC 7.8 05/20/2014   LABURIC 6.8 05/20/2013   REPTSTATUS 01/17/2023 FINAL 01/12/2023   GRAMSTAIN NO WBC SEEN NO ORGANISMS SEEN  01/12/2023   CULT  01/12/2023    No growth aerobically or anaerobically. Performed at [facility], [address], [place], [place] [address]    LABORGA PSEUDOMONAS AERUGINOSA 11/10/2022   LABORGA ENTEROCOCCUS FAECALIS 11/10/2022     Lab Results  Component Value Date   ALBUMIN 2.9 (L) 06/16/2022    ALBUMIN 3.8 06/14/2022   ALBUMIN 4.0 09/25/2016   PREALBUMIN 18 06/14/2022    Lab Results  Component Value Date   MG 1.8 04/10/2023   MG 1.8 06/19/2022   MG 1.9 04/04/2022   Lab Results  Component Value Date   VD25OH 45 04/10/2023   VD25OH 57 10/05/2022   VD25OH 59 04/04/2022    Lab Results  Component Value Date   PREALBUMIN 18 06/14/2022      Latest Ref Rng & Units 04/10/2023    3:22 PM 01/15/2023    4:53 PM 01/12/2023    8:56 AM  CBC EXTENDED  WBC 3.8 - 10.8 Thousand/uL 8.3  8.9  8.1   RBC 4.20 - 5.80 Million/uL 3.65  3.55  3.59   Hemoglobin 13.2 - 17.1 g/dL 88.6  88.7  88.5   HCT 38.5 - 50.0 % 34.3  33.0  33.5   Platelets 140 - 400 Thousand/uL 474  340  356   NEUT# 1,500 - 7,800 cells/uL 4,382  4.8    Lymph# 0.7 - 4.0 K/uL  2.3       There is no height or weight on file to calculate BMI.  Orders:  No orders of the defined types were placed in this encounter.  No orders of the defined  types were placed in this encounter.    Procedures: No procedures performed  Clinical Data: No additional findings.  ROS:  All other systems negative, except as noted in the HPI. Review of Systems  Objective: Vital Signs: There were no vitals taken for this visit.  Specialty Comments:  No specialty comments available.  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) 06/16/2022   Osteomyelitis (HCC) 06/16/2022   Tenosynovitis of tibialis posterior tendon 06/15/2022   Wound infection 06/15/2022   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   Anemia 03/13/2019   Chronic venous insufficiency 03/12/2019   CKD (chronic kidney disease) stage 2, 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/2015   COPD (chronic obstructive pulmonary disease) (HCC) 12/08/2015    Chronic pain syndrome 05/26/2015   DDD (degenerative disc disease), lumbar 05/26/2015   Medication management 05/26/2015   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   Lumbago with sciatica 08/06/2014   Hyperlipidemia 08/20/2013   Hypertension    Abnormal glucose    Vitamin D  deficiency    Arthritis    Vitamin B12 deficiency    Celiac disease 06/19/2012   GERD (gastroesophageal reflux disease) 04/30/2012   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 tube) procedure 04/30/2012   Hypothyroidism 04/30/2012   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   Past Medical History:  Diagnosis Date   Anemia    Celiac disease    diagnoses 06/24/12   Chronic ulcer of ankle (HCC)    left   Esophageal stricture    GERD (gastroesophageal reflux disease)    Hiatal hernia    Hypertension    patient states  no current HTN   Hypothyroidism    Pre-diabetes    Prediabetes    Shortness of breath    from oxycodone  at times   Vitamin B12 deficiency    Vitamin D  deficiency     Family History  Problem Relation Age of Onset   Esophageal cancer Father     Past Surgical History:  Procedure Laterality Date   APPENDECTOMY     APPLICATION OF WOUND VAC Left 01/12/2023   Procedure: APPLICATION OF WOUND VAC;  Surgeon: Harden Jerona GAILS, MD;  Location: [facility] OR;  Service: Orthopedics;  Laterality: Left;   BACK SURGERY     3 diff surgeries for vertebrea broken   EYE SURGERY     bilateral cataract surgery   HERNIA REPAIR  02/10/1949   bilateral groin as child   HIATAL HERNIA REPAIR     I & D EXTREMITY Left 06/16/2022   Procedure: IRRIGATION AND DEBRIDEMENT ANKLE;  Surgeon: Harden Jerona GAILS, MD;  Location: [facility] OR;  Service: Orthopedics;  Laterality: Left;   I & D EXTREMITY Left 07/21/2022   Procedure: LEFT ANKLE DEBRIDEMENT;  Surgeon: Harden Jerona GAILS, MD;  Location: [facility] OR;  Service: Orthopedics;  Laterality: Left;   I & D EXTREMITY Left 11/10/2022   Procedure: LEFT ANKLE DEBRIDEMENT;  Surgeon: Harden Jerona GAILS, MD;  Location: [facility]  OR;  Service: Orthopedics;  Laterality: Left;   I & D EXTREMITY Left 01/12/2023   Procedure: LEFT ANKLE DEBRIDEMENT;  Surgeon: Harden Jerona GAILS, MD;  Location: [facility] OR;  Service: Orthopedics;  Laterality: Left;   JOINT REPLACEMENT  04/12/2010   right knee replacement   TONSILLECTOMY  as child   TOTAL KNEE REVISION  11/20/2011   Procedure: TOTAL KNEE REVISION;  Surgeon: Donnice JONETTA Car, MD;  Location: [facility] ORS;  Service: Orthopedics;  Laterality: Right;  Right Total Knee Revision   Social History   Occupational History   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

## 2023-07-01 ENCOUNTER — Encounter: Payer: Self-pay | Admitting: Internal Medicine

## 2023-07-05 DIAGNOSIS — L270 Generalized skin eruption due to drugs and medicaments taken internally: Secondary | ICD-10-CM | POA: Diagnosis not present

## 2023-07-09 ENCOUNTER — Encounter: Payer: Self-pay | Admitting: Internal Medicine

## 2023-07-12 ENCOUNTER — Encounter: Payer: Self-pay | Admitting: Internal Medicine

## 2023-07-16 ENCOUNTER — Ambulatory Visit: Payer: Medicare Other | Admitting: Orthopedic Surgery

## 2023-07-16 DIAGNOSIS — L97323 Non-pressure chronic ulcer of left ankle with necrosis of muscle: Secondary | ICD-10-CM

## 2023-07-16 DIAGNOSIS — Z945 Skin transplant status: Secondary | ICD-10-CM

## 2023-07-17 ENCOUNTER — Encounter: Payer: Self-pay | Admitting: Orthopedic Surgery

## 2023-07-17 NOTE — Progress Notes (Signed)
Office Visit Note   Patient: [name] Fernandez           Date of Birth: 1940-08-10           MRN: [id number] Visit Date: 07/16/2023              Requested by: Lucky Cowboy, MD [address] [place],  [place] [address] PCP: Lucky Cowboy, MD  Chief Complaint  Patient presents with   Left Ankle - Wound Check      HPI: Patient is an 83 year old gentleman who is seen in follow-up for chronic lateral left ankle ulcer.  Patient is currently using Vashe dressing changes and this seems to have helped the best.  Assessment & Plan: Visit Diagnoses:  1. Ankle ulcer, left, with necrosis of muscle (HCC)   2. H/O skin graft     Plan: Continue with the Vashe dressing changes.  Follow-Up Instructions: Return in about 4 weeks (around 08/13/2023).   Ortho Exam  Patient is alert, oriented, no adenopathy, well-dressed, normal affect, normal respiratory effort. Examination there is no cellulitis there is no fluctuation there is no exposed bone or tendon.  The wound is flat with 75% granulation tissue.  The wound is smaller and measures 5 x 4 cm.  There is venous stasis swelling.  Imaging: No results found.   Labs: Lab Results  Component Value Date   HGBA1C 6.5 (H) 04/10/2023   HGBA1C 6.3 (H) 10/05/2022   HGBA1C 6.4 (H) 04/04/2022   ESRSEDRATE 34 (H) 01/15/2023   ESRSEDRATE 32 (H) 06/15/2022   ESRSEDRATE 40 (H) 06/14/2022   CRP 0.7 01/15/2023   CRP 0.8 06/15/2022   CRP <0.5 06/14/2022   LABURIC 7.8 05/20/2014   LABURIC 6.8 05/20/2013   REPTSTATUS 01/17/2023 FINAL 01/12/2023   GRAMSTAIN NO WBC SEEN NO ORGANISMS SEEN  01/12/2023   CULT  01/12/2023    No growth aerobically or anaerobically. Performed at [facility], [address] [place], [place] [address]    LABORGA PSEUDOMONAS AERUGINOSA 11/10/2022   LABORGA ENTEROCOCCUS FAECALIS 11/10/2022     Lab Results  Component Value Date   ALBUMIN 2.9 (L) 06/16/2022   ALBUMIN 3.8 06/14/2022   ALBUMIN  4.0 09/25/2016   PREALBUMIN 18 06/14/2022    Lab Results  Component Value Date   MG 1.8 04/10/2023   MG 1.8 06/19/2022   MG 1.9 04/04/2022   Lab Results  Component Value Date   VD25OH 45 04/10/2023   VD25OH 57 10/05/2022   VD25OH 59 04/04/2022    Lab Results  Component Value Date   PREALBUMIN 18 06/14/2022      Latest Ref Rng & Units 04/10/2023    3:22 PM 01/15/2023    4:53 PM 01/12/2023    8:56 AM  CBC EXTENDED  WBC 3.8 - 10.8 Thousand/uL 8.3  8.9  8.1   RBC 4.20 - 5.80 Million/uL 3.65  3.55  3.59   Hemoglobin 13.2 - 17.1 g/dL 08.6  57.8  46.9   HCT 38.5 - 50.0 % 34.3  33.0  33.5   Platelets 140 - 400 Thousand/uL 474  340  356   NEUT# 1,500 - 7,800 cells/uL 4,382  4.8    Lymph# 0.7 - 4.0 K/uL  2.3       There is no height or weight on file to calculate BMI.  Orders:  No orders of the defined types were placed in this encounter.  No orders of the defined types were placed in this  encounter.    Procedures: No procedures performed  Clinical Data: No additional findings.  ROS:  All other systems negative, except as noted in the HPI. Review of Systems  Objective: Vital Signs: There were no vitals taken for this visit.  Specialty Comments:  No specialty comments available.  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) 06/16/2022   Osteomyelitis (HCC) 06/16/2022   Tenosynovitis of tibialis posterior tendon 06/15/2022   Wound infection 06/15/2022   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   Anemia 03/13/2019   Chronic venous insufficiency 03/12/2019   CKD (chronic kidney disease) stage 2, 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/2015   COPD (chronic obstructive pulmonary disease) (HCC) 12/08/2015   Chronic pain syndrome 05/26/2015    DDD (degenerative disc disease), lumbar 05/26/2015   Medication management 05/26/2015   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   Lumbago with sciatica 08/06/2014   Hyperlipidemia 08/20/2013   Hypertension    Abnormal glucose    Vitamin D deficiency    Arthritis    Vitamin B12 deficiency    Celiac disease 06/19/2012   GERD (gastroesophageal reflux disease) 04/30/2012   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 tube) procedure 04/30/2012   Hypothyroidism 04/30/2012   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   Past Medical History:  Diagnosis Date   Anemia    Celiac disease    diagnoses 06/24/12   Chronic ulcer of ankle (HCC)    left   Esophageal stricture    GERD (gastroesophageal reflux disease)    Hiatal hernia    Hypertension    patient states " no current HTN"   Hypothyroidism    Pre-diabetes    Prediabetes    Shortness of breath    from oxycodone at times   Vitamin B12 deficiency    Vitamin D deficiency     Family History  Problem Relation Age of Onset   Esophageal cancer Father     Past Surgical History:  Procedure Laterality Date   APPENDECTOMY     APPLICATION OF WOUND VAC Left 01/12/2023   Procedure: APPLICATION OF WOUND VAC;  Surgeon: Nadara Mustard, MD;  Location: [facility] OR;  Service: Orthopedics;  Laterality: Left;   BACK SURGERY     3 diff surgeries for vertebrea broken   EYE SURGERY     bilateral cataract surgery   HERNIA REPAIR  02/10/1949   bilateral groin as child   HIATAL HERNIA REPAIR     I & D EXTREMITY Left 06/16/2022   Procedure: IRRIGATION AND DEBRIDEMENT ANKLE;  Surgeon: Nadara Mustard, MD;  Location: [facility] OR;  Service: Orthopedics;  Laterality: Left;   I & D EXTREMITY Left 07/21/2022   Procedure: LEFT ANKLE DEBRIDEMENT;  Surgeon: Nadara Mustard, MD;  Location: [facility] OR;  Service: Orthopedics;  Laterality: Left;   I & D EXTREMITY Left 11/10/2022   Procedure: LEFT ANKLE DEBRIDEMENT;  Surgeon: Nadara Mustard, MD;  Location: [facility] OR;  Service: Orthopedics;   Laterality: Left;   I & D EXTREMITY Left 01/12/2023   Procedure: LEFT ANKLE DEBRIDEMENT;  Surgeon: Nadara Mustard, MD;  Location: [facility] OR;  Service: Orthopedics;  Laterality: Left;   JOINT REPLACEMENT  04/12/2010   right knee replacement   TONSILLECTOMY     as child  TOTAL KNEE REVISION  11/20/2011   Procedure: TOTAL KNEE REVISION;  Surgeon: Shelda Pal, MD;  Location: [facility]S;  Service: Orthopedics;  Laterality: Right;  Right Total Knee Revision   Social History   Occupational History   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

## 2023-07-19 ENCOUNTER — Ambulatory Visit: Payer: Medicare HMO | Admitting: Nurse Practitioner

## 2023-07-26 ENCOUNTER — Other Ambulatory Visit: Payer: Self-pay | Admitting: Orthopedic Surgery

## 2023-08-13 ENCOUNTER — Ambulatory Visit: Payer: Medicare Other | Admitting: Orthopedic Surgery

## 2023-08-13 DIAGNOSIS — L97323 Non-pressure chronic ulcer of left ankle with necrosis of muscle: Secondary | ICD-10-CM

## 2023-08-19 ENCOUNTER — Encounter: Payer: Self-pay | Admitting: Orthopedic Surgery

## 2023-08-19 NOTE — Progress Notes (Signed)
 Office Visit Note   Patient: [name] Fernandez           Date of Birth: 1940/09/10           MRN: [id number] Visit Date: 08/13/2023              Requested by: Lucky Cowboy, MD [address] [place],  [place] [address] PCP: Lucky Cowboy, MD  Chief Complaint  Patient presents with   Left Ankle - Follow-up      HPI: Patient is an 83 year old gentleman who presents in follow-up for chronic left ankle ulcer.  He is using Vashe dressing changes.  Assessment & Plan: Visit Diagnoses:  1. Ankle ulcer, left, with necrosis of muscle (HCC)     Plan: Continue with the Vashe cleansing recommend resume using the Vive wear compression socks.  A sock was provided today.  Follow-Up Instructions: Return in about 4 weeks (around 09/10/2023).   Ortho Exam  Patient is alert, oriented, no adenopathy, well-dressed, normal affect, normal respiratory effort. Examination the left ankle ulcer is flat with 80% granulation tissue measures 4 x 4 cm no cellulitis no drainage no exposed bone or tendon no fluctuance.  Patient does have increased venous swelling.  Imaging: No results found.   Labs: Lab Results  Component Value Date   HGBA1C 6.5 (H) 04/10/2023   HGBA1C 6.3 (H) 10/05/2022   HGBA1C 6.4 (H) 04/04/2022   ESRSEDRATE 34 (H) 01/15/2023   ESRSEDRATE 32 (H) 06/15/2022   ESRSEDRATE 40 (H) 06/14/2022   CRP 0.7 01/15/2023   CRP 0.8 06/15/2022   CRP <0.5 06/14/2022   LABURIC 7.8 05/20/2014   LABURIC 6.8 05/20/2013   REPTSTATUS 01/17/2023 FINAL 01/12/2023   GRAMSTAIN NO WBC SEEN NO ORGANISMS SEEN  01/12/2023   CULT  01/12/2023    No growth aerobically or anaerobically. Performed at [facility], [address], [place], [place] [address]    LABORGA PSEUDOMONAS AERUGINOSA 11/10/2022   LABORGA ENTEROCOCCUS FAECALIS 11/10/2022     Lab Results  Component Value Date   ALBUMIN 2.9 (L) 06/16/2022   ALBUMIN 3.8 06/14/2022   ALBUMIN 4.0 09/25/2016   PREALBUMIN  18 06/14/2022    Lab Results  Component Value Date   MG 1.8 04/10/2023   MG 1.8 06/19/2022   MG 1.9 04/04/2022   Lab Results  Component Value Date   VD25OH 45 04/10/2023   VD25OH 57 10/05/2022   VD25OH 59 04/04/2022    Lab Results  Component Value Date   PREALBUMIN 18 06/14/2022      Latest Ref Rng & Units 04/10/2023    3:22 PM 01/15/2023    4:53 PM 01/12/2023    8:56 AM  CBC EXTENDED  WBC 3.8 - 10.8 Thousand/uL 8.3  8.9  8.1   RBC 4.20 - 5.80 Million/uL 3.65  3.55  3.59   Hemoglobin 13.2 - 17.1 g/dL 82.9  56.2  13.0   HCT 38.5 - 50.0 % 34.3  33.0  33.5   Platelets 140 - 400 Thousand/uL 474  340  356   NEUT# 1,500 - 7,800 cells/uL 4,382  4.8    Lymph# 0.7 - 4.0 K/uL  2.3       There is no height or weight on file to calculate BMI.  Orders:  No orders of the defined types were placed in this encounter.  No orders of the defined types were placed in this encounter.    Procedures: No procedures performed  Clinical Data: No additional  findings.  ROS:  All other systems negative, except as noted in the HPI. Review of Systems  Objective: Vital Signs: There were no vitals taken for this visit.  Specialty Comments:  No specialty comments available.  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) 06/16/2022   Osteomyelitis (HCC) 06/16/2022   Tenosynovitis of tibialis posterior tendon 06/15/2022   Wound infection 06/15/2022   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   Anemia 03/13/2019   Chronic venous insufficiency 03/12/2019   CKD (chronic kidney disease) stage 2, 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/2015   COPD (chronic obstructive pulmonary disease) (HCC) 12/08/2015   Chronic pain syndrome 05/26/2015   DDD (degenerative disc  disease), lumbar 05/26/2015   Medication management 05/26/2015   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   Lumbago with sciatica 08/06/2014   Hyperlipidemia 08/20/2013   Hypertension    Abnormal glucose    Vitamin D deficiency    Arthritis    Vitamin B12 deficiency    Celiac disease 06/19/2012   GERD (gastroesophageal reflux disease) 04/30/2012   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 tube) procedure 04/30/2012   Hypothyroidism 04/30/2012   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   Past Medical History:  Diagnosis Date   Anemia    Celiac disease    diagnoses 06/24/12   Chronic ulcer of ankle (HCC)    left   Esophageal stricture    GERD (gastroesophageal reflux disease)    Hiatal hernia    Hypertension    patient states " no current HTN"   Hypothyroidism    Pre-diabetes    Prediabetes    Shortness of breath    from oxycodone at times   Vitamin B12 deficiency    Vitamin D deficiency     Family History  Problem Relation Age of Onset   Esophageal cancer Father     Past Surgical History:  Procedure Laterality Date   APPENDECTOMY     APPLICATION OF WOUND VAC Left 01/12/2023   Procedure: APPLICATION OF WOUND VAC;  Surgeon: Nadara Mustard, MD;  Location: [facility] OR;  Service: Orthopedics;  Laterality: Left;   BACK SURGERY     3 diff surgeries for vertebrea broken   EYE SURGERY     bilateral cataract surgery   HERNIA REPAIR  02/10/1949   bilateral groin as child   HIATAL HERNIA REPAIR     I & D EXTREMITY Left 06/16/2022   Procedure: IRRIGATION AND DEBRIDEMENT ANKLE;  Surgeon: Nadara Mustard, MD;  Location: [facility] OR;  Service: Orthopedics;  Laterality: Left;   I & D EXTREMITY Left 07/21/2022   Procedure: LEFT ANKLE DEBRIDEMENT;  Surgeon: Nadara Mustard, MD;  Location: [facility] OR;  Service: Orthopedics;  Laterality: Left;   I & D EXTREMITY Left 11/10/2022   Procedure: LEFT ANKLE DEBRIDEMENT;  Surgeon: Nadara Mustard, MD;  Location[facility];  Service: Orthopedics;  Laterality: Left;   I & D  EXTREMITY Left 01/12/2023   Procedure: LEFT ANKLE DEBRIDEMENT;  Surgeon: Nadara Mustard, MD;  Location: [facility] OR;  Service: Orthopedics;  Laterality: Left;   JOINT REPLACEMENT  04/12/2010   right knee replacement   TONSILLECTOMY     as child   TOTAL KNEE REVISION  11/20/2011   Procedure: TOTAL KNEE REVISION;  Surgeon:  Shelda Pal, MD;  Location: [facility];  Service: Orthopedics;  Laterality: Right;  Right Total Knee Revision   Social History   Occupational History   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

## 2023-08-21 DIAGNOSIS — L2989 Other pruritus: Secondary | ICD-10-CM | POA: Diagnosis not present

## 2023-09-10 ENCOUNTER — Ambulatory Visit: Admitting: Orthopedic Surgery

## 2023-09-18 ENCOUNTER — Ambulatory Visit: Admitting: Orthopedic Surgery

## 2023-09-18 DIAGNOSIS — Z945 Skin transplant status: Secondary | ICD-10-CM | POA: Diagnosis not present

## 2023-09-18 DIAGNOSIS — L97323 Non-pressure chronic ulcer of left ankle with necrosis of muscle: Secondary | ICD-10-CM | POA: Diagnosis not present

## 2023-09-19 ENCOUNTER — Encounter: Payer: Self-pay | Admitting: Orthopedic Surgery

## 2023-09-19 DIAGNOSIS — L97929 Non-pressure chronic ulcer of unspecified part of left lower leg with unspecified severity: Secondary | ICD-10-CM | POA: Diagnosis not present

## 2023-09-19 NOTE — Progress Notes (Signed)
 Office Visit Note   Patient: [name] Fernandez           Date of Birth: [birth year]           MRN: [id number] Visit Date: 09/18/2023              Requested by: Lucky Cowboy, MD [address] [place],  [place] [address] PCP: Lucky Cowboy, MD  Chief Complaint  Patient presents with   Left Ankle - Wound Check      HPI: Patient is an 83 year old gentleman with ulceration medial left foot and ankle.  Patient is using Vashe for wound care.  Currently wearing the Vive compression sock.  Patient has had a rash over his chest and arms that patient states that during the pathology states it is a drug reaction.  Patient states he has a recent weight loss of 30 pounds.  Assessment & Plan: Visit Diagnoses:  1. Ankle ulcer, left, with necrosis of muscle (HCC)   2. H/O skin graft     Plan: Will get patient set up to be evaluated by primary care physician.  Discussed that with the weight loss and systemic rash patient needs a generalized medical workup.  Follow-Up Instructions: Return in about 4 weeks (around 10/16/2023).   Ortho Exam  Patient is alert, oriented, no adenopathy, well-dressed, normal affect, normal respiratory effort. Examination the ulcer is stable it measures 5 x 5 cm with 50% fibrinous exudative tissue.  There is no cellulitis odor or drainage no exposed tendons.  Patient does have increased swelling in the left lower extremity the calf and thigh are soft nontender no evidence of DVT.  He has a petechial rash involving his chest arms legs.  Patient states that he has had a reaction to prednisone that has not helped and he has had no relief with Benadryl.  Imaging: No results found.   Labs: Lab Results  Component Value Date   HGBA1C 6.5 (H) 04/10/2023   HGBA1C 6.3 (H) 10/05/2022   HGBA1C 6.4 (H) 04/04/2022   ESRSEDRATE 34 (H) 01/15/2023   ESRSEDRATE 32 (H) 06/15/2022   ESRSEDRATE 40 (H) 06/14/2022   CRP 0.7 01/15/2023   CRP 0.8 06/15/2022   CRP  <0.5 06/14/2022   LABURIC 7.8 05/20/2014   LABURIC 6.8 05/20/2013   REPTSTATUS 01/17/2023 FINAL 01/12/2023   GRAMSTAIN NO WBC SEEN NO ORGANISMS SEEN  01/12/2023   CULT  01/12/2023    No growth aerobically or anaerobically. Performed at [facility], [address] [place], [place] [address]    LABORGA PSEUDOMONAS AERUGINOSA 11/10/2022   LABORGA ENTEROCOCCUS FAECALIS 11/10/2022     Lab Results  Component Value Date   ALBUMIN 2.9 (L) 06/16/2022   ALBUMIN 3.8 06/14/2022   ALBUMIN 4.0 09/25/2016   PREALBUMIN 18 06/14/2022    Lab Results  Component Value Date   MG 1.8 04/10/2023   MG 1.8 06/19/2022   MG 1.9 04/04/2022   Lab Results  Component Value Date   VD25OH 45 04/10/2023   VD25OH 57 10/05/2022   VD25OH 59 04/04/2022    Lab Results  Component Value Date   PREALBUMIN 18 06/14/2022      Latest Ref Rng & Units 04/10/2023    3:22 PM 01/15/2023    4:53 PM 01/12/2023    8:56 AM  CBC EXTENDED  WBC 3.8 - 10.8 Thousand/uL 8.3  8.9  8.1   RBC 4.20 - 5.80 Million/uL 3.65  3.55  3.59   Hemoglobin 13.2 -  17.1 g/dL 29.5  62.1  30.8   HCT 38.5 - 50.0 % 34.3  33.0  33.5   Platelets 140 - 400 Thousand/uL 474  340  356   NEUT# 1,500 - 7,800 cells/uL 4,382  4.8    Lymph# 0.7 - 4.0 K/uL  2.3       There is no height or weight on file to calculate BMI.  Orders:  No orders of the defined types were placed in this encounter.  No orders of the defined types were placed in this encounter.    Procedures: No procedures performed  Clinical Data: No additional findings.  ROS:  All other systems negative, except as noted in the HPI. Review of Systems  Objective: Vital Signs: There were no vitals taken for this visit.  Specialty Comments:  No specialty comments available.  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) 06/16/2022   Osteomyelitis (HCC) 06/16/2022   Tenosynovitis of tibialis posterior tendon 06/15/2022   Wound infection 06/15/2022   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   Anemia 03/13/2019   Chronic venous insufficiency 03/12/2019   CKD (chronic kidney disease) stage 2, 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/2015   COPD (chronic obstructive pulmonary disease) (HCC) 12/08/2015   Chronic pain syndrome 05/26/2015   DDD (degenerative disc disease), lumbar 05/26/2015   Medication management 05/26/2015   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   Lumbago with sciatica 08/06/2014   Hyperlipidemia 08/20/2013   Hypertension    Abnormal glucose    Vitamin D deficiency    Arthritis    Vitamin B12 deficiency    Celiac disease 06/19/2012   GERD (gastroesophageal reflux disease) 04/30/2012   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 tube) procedure 04/30/2012   Hypothyroidism 04/30/2012   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   Past Medical History:  Diagnosis Date   Anemia    Celiac disease    diagnoses 06/24/12   Chronic ulcer of ankle (HCC)    left   Esophageal stricture    GERD (gastroesophageal reflux disease)    Hiatal hernia    Hypertension    patient states " no current HTN"   Hypothyroidism    Pre-diabetes    Prediabetes    Shortness of breath    from oxycodone at times   Vitamin B12 deficiency    Vitamin D deficiency     Family History  Problem Relation Age of Onset   Esophageal cancer Father     Past Surgical History:  Procedure Laterality Date   APPENDECTOMY     APPLICATION OF WOUND VAC Left 01/12/2023   Procedure: APPLICATION OF WOUND VAC;  Surgeon: Nadara Mustard, MD;  Location: [facility] OR;  Service: Orthopedics;  Laterality: Left;   BACK SURGERY     3 diff surgeries for vertebrea broken   EYE SURGERY     bilateral cataract surgery   HERNIA REPAIR  02/10/1949   bilateral groin as child    HIATAL HERNIA REPAIR     I & D EXTREMITY Left 06/16/2022   Procedure: IRRIGATION AND DEBRIDEMENT ANKLE;  Surgeon: Nadara Mustard, MD;  Location: [facility] OR;  Service: Orthopedics;  Laterality: Left;   I & D EXTREMITY Left 07/21/2022   Procedure: LEFT ANKLE DEBRIDEMENT;  Surgeon: Nadara Mustard,  MD;  Location: [facility] OR;  Service: Orthopedics;  Laterality: Left;   I & D EXTREMITY Left 11/10/2022   Procedure: LEFT ANKLE DEBRIDEMENT;  Surgeon: Nadara Mustard, MD;  Location: [facility] OR;  Service: Orthopedics;  Laterality: Left;   I & D EXTREMITY Left 01/12/2023   Procedure: LEFT ANKLE DEBRIDEMENT;  Surgeon: Nadara Mustard, MD;  Location: [facility] OR;  Service: Orthopedics;  Laterality: Left;   JOINT REPLACEMENT  04/12/2010   right knee replacement   TONSILLECTOMY     as child   TOTAL KNEE REVISION  11/20/2011   Procedure: TOTAL KNEE REVISION;  Surgeon: Shelda Pal, MD;  Location: [facility] ORS;  Service: Orthopedics;  Laterality: Right;  Right Total Knee Revision   Social History   Occupational History   Occupation: Retired    Associate Professor: [employer]  Tobacco Use   Smoking status: Former    Current packs/day: 0.00    Average packs/day: 1 pack/day for 20.0 years (20.0 ttl pk-yrs)    Types: Cigarettes    Start date: 06/27/1960    Quit date: 06/27/1980    Years since quitting: 43.2    Passive exposure: Past   Smokeless tobacco: Never  Vaping Use   Vaping status: Never Used  Substance and Sexual Activity   Alcohol use: No   Drug use: No   Sexual activity: Yes

## 2023-09-21 ENCOUNTER — Emergency Department (HOSPITAL_BASED_OUTPATIENT_CLINIC_OR_DEPARTMENT_OTHER)

## 2023-09-21 ENCOUNTER — Ambulatory Visit: Payer: Self-pay

## 2023-09-21 ENCOUNTER — Other Ambulatory Visit: Payer: Self-pay

## 2023-09-21 ENCOUNTER — Observation Stay (HOSPITAL_BASED_OUTPATIENT_CLINIC_OR_DEPARTMENT_OTHER)
Admission: EM | Admit: 2023-09-21 | Discharge: 2023-09-23 | Disposition: A | Discharge status: Home / Self Care | Attending: Internal Medicine | Admitting: Internal Medicine

## 2023-09-21 ENCOUNTER — Encounter (HOSPITAL_BASED_OUTPATIENT_CLINIC_OR_DEPARTMENT_OTHER): Payer: Self-pay | Admitting: Emergency Medicine

## 2023-09-21 DIAGNOSIS — L97329 Non-pressure chronic ulcer of left ankle with unspecified severity: Secondary | ICD-10-CM | POA: Insufficient documentation

## 2023-09-21 DIAGNOSIS — N182 Chronic kidney disease, stage 2 (mild): Secondary | ICD-10-CM | POA: Insufficient documentation

## 2023-09-21 DIAGNOSIS — I700 Atherosclerosis of aorta: Secondary | ICD-10-CM | POA: Diagnosis not present

## 2023-09-21 DIAGNOSIS — D303 Benign neoplasm of bladder: Secondary | ICD-10-CM | POA: Diagnosis not present

## 2023-09-21 DIAGNOSIS — I10 Essential (primary) hypertension: Secondary | ICD-10-CM

## 2023-09-21 DIAGNOSIS — R55 Syncope and collapse: Secondary | ICD-10-CM | POA: Diagnosis not present

## 2023-09-21 DIAGNOSIS — Z794 Long term (current) use of insulin: Secondary | ICD-10-CM | POA: Diagnosis not present

## 2023-09-21 DIAGNOSIS — I6782 Cerebral ischemia: Secondary | ICD-10-CM | POA: Diagnosis not present

## 2023-09-21 DIAGNOSIS — Z91040 Latex allergy status: Secondary | ICD-10-CM | POA: Diagnosis not present

## 2023-09-21 DIAGNOSIS — Z87891 Personal history of nicotine dependence: Secondary | ICD-10-CM | POA: Diagnosis not present

## 2023-09-21 DIAGNOSIS — R42 Dizziness and giddiness: Principal | ICD-10-CM

## 2023-09-21 DIAGNOSIS — L8990 Pressure ulcer of unspecified site, unspecified stage: Secondary | ICD-10-CM | POA: Insufficient documentation

## 2023-09-21 DIAGNOSIS — J449 Chronic obstructive pulmonary disease, unspecified: Secondary | ICD-10-CM | POA: Diagnosis not present

## 2023-09-21 DIAGNOSIS — L97309 Non-pressure chronic ulcer of unspecified ankle with unspecified severity: Secondary | ICD-10-CM

## 2023-09-21 DIAGNOSIS — Z79899 Other long term (current) drug therapy: Secondary | ICD-10-CM | POA: Diagnosis not present

## 2023-09-21 DIAGNOSIS — E1122 Type 2 diabetes mellitus with diabetic chronic kidney disease: Secondary | ICD-10-CM | POA: Diagnosis not present

## 2023-09-21 DIAGNOSIS — D494 Neoplasm of unspecified behavior of bladder: Secondary | ICD-10-CM | POA: Diagnosis not present

## 2023-09-21 DIAGNOSIS — N3289 Other specified disorders of bladder: Secondary | ICD-10-CM

## 2023-09-21 DIAGNOSIS — I6522 Occlusion and stenosis of left carotid artery: Secondary | ICD-10-CM | POA: Diagnosis not present

## 2023-09-21 DIAGNOSIS — R29818 Other symptoms and signs involving the nervous system: Secondary | ICD-10-CM | POA: Diagnosis not present

## 2023-09-21 DIAGNOSIS — K219 Gastro-esophageal reflux disease without esophagitis: Secondary | ICD-10-CM | POA: Diagnosis not present

## 2023-09-21 DIAGNOSIS — R634 Abnormal weight loss: Secondary | ICD-10-CM

## 2023-09-21 DIAGNOSIS — I129 Hypertensive chronic kidney disease with stage 1 through stage 4 chronic kidney disease, or unspecified chronic kidney disease: Secondary | ICD-10-CM | POA: Insufficient documentation

## 2023-09-21 DIAGNOSIS — I87332 Chronic venous hypertension (idiopathic) with ulcer and inflammation of left lower extremity: Secondary | ICD-10-CM | POA: Diagnosis not present

## 2023-09-21 DIAGNOSIS — N329 Bladder disorder, unspecified: Secondary | ICD-10-CM | POA: Diagnosis not present

## 2023-09-21 DIAGNOSIS — E785 Hyperlipidemia, unspecified: Secondary | ICD-10-CM | POA: Diagnosis not present

## 2023-09-21 DIAGNOSIS — R609 Edema, unspecified: Secondary | ICD-10-CM

## 2023-09-21 DIAGNOSIS — E11622 Type 2 diabetes mellitus with other skin ulcer: Secondary | ICD-10-CM

## 2023-09-21 LAB — BASIC METABOLIC PANEL WITH GFR
Anion gap: 8 (ref 5–15)
BUN: 23 mg/dL (ref 8–23)
CO2: 28 mmol/L (ref 22–32)
Calcium: 9 mg/dL (ref 8.9–10.3)
Chloride: 102 mmol/L (ref 98–111)
Creatinine, Ser: 1.1 mg/dL (ref 0.61–1.24)
GFR, Estimated: >60 mL/min (ref >60)
Glucose, Bld: 108 mg/dL — ABNORMAL HIGH (ref 70–99)
Potassium: 3.8 mmol/L (ref 3.5–5.1)
Sodium: 138 mmol/L (ref 135–145)

## 2023-09-21 LAB — HEPATIC FUNCTION PANEL
ALT: 22 U/L (ref 0–44)
AST: 28 U/L (ref 15–41)
Albumin: 3.9 g/dL (ref 3.5–5.0)
Alkaline Phosphatase: 85 U/L (ref 38–126)
Bilirubin, Direct: 0.2 mg/dL (ref 0.0–0.2)
Indirect Bilirubin: 0.5 mg/dL (ref 0.3–0.9)
Total Bilirubin: 0.7 mg/dL (ref 0.0–1.2)
Total Protein: 7.5 g/dL (ref 6.5–8.1)

## 2023-09-21 LAB — CBC
HCT: 33.4 % — ABNORMAL LOW (ref 39.0–52.0)
Hemoglobin: 11.1 g/dL — ABNORMAL LOW (ref 13.0–17.0)
MCH: 28.9 pg (ref 26.0–34.0)
MCHC: 33.2 g/dL (ref 30.0–36.0)
MCV: 87 fL (ref 80.0–100.0)
Platelets: 451 10*3/uL — ABNORMAL HIGH (ref 150–400)
RBC: 3.84 MIL/uL — ABNORMAL LOW (ref 4.22–5.81)
RDW: 16.4 % — ABNORMAL HIGH (ref 11.5–15.5)
WBC: 8.3 10*3/uL (ref 4.0–10.5)
nRBC: 0 % (ref 0.0–0.2)

## 2023-09-21 LAB — URINALYSIS, ROUTINE W REFLEX MICROSCOPIC
Bilirubin Urine: NEGATIVE
Glucose, UA: NEGATIVE mg/dL
Hgb urine dipstick: NEGATIVE
Ketones, ur: NEGATIVE mg/dL
Leukocytes,Ua: NEGATIVE
Nitrite: NEGATIVE
Protein, ur: NEGATIVE mg/dL
Specific Gravity, Urine: 1.009 (ref 1.005–1.030)
pH: 5.5 (ref 5.0–8.0)

## 2023-09-21 LAB — CBG MONITORING, ED: Glucose-Capillary: 106 mg/dL — ABNORMAL HIGH (ref 70–99)

## 2023-09-21 LAB — TSH: TSH: 2.1 u[IU]/mL (ref 0.350–4.500)

## 2023-09-21 LAB — LIPASE, BLOOD: Lipase: 16 U/L (ref 11–51)

## 2023-09-21 MED ORDER — SODIUM CHLORIDE 0.9 % IV BOLUS
1000.0000 mL | Freq: Once | INTRAVENOUS | Status: AC
  Administered 2023-09-21: 1000 mL via INTRAVENOUS

## 2023-09-21 MED ORDER — IOHEXOL 350 MG/ML SOLN
100.0000 mL | Freq: Once | INTRAVENOUS | Status: AC | PRN
  Administered 2023-09-21: 100 mL via INTRAVENOUS

## 2023-09-21 NOTE — Telephone Encounter (Signed)
 1st attempt - received VMB, LVM to return call     Copied From CRM [phone]. Reason for Triage: dropped call , I tried to call pt 's wife back, but went to vm. Pt has dizziness and sore on ankle

## 2023-09-21 NOTE — Telephone Encounter (Signed)
  Chief Complaint: double vision/off balance  Symptoms: h/o "almost" blacking out, weight loss, genrealized weakness/ fatigue Frequency: 1 week  Pertinent Negatives: Patient denies numbness or weakness on one side of body Disposition: [x] ED /[] Urgent Care (no appt availability in office) / [] Appointment(In office/virtual)/ []  [facility]/ [] Home Care/ [] Refused Recommended Disposition /[] [facility]/ []  Follow-up with PCP Additional Notes:  Copied from CRM [id number]. Topic: Clinical - Red Word Triage >> Sep 21, 2023 11:45 AM Godfrey Pick wrote: Red Word that prompted transfer to Nurse Triage: Dizziness x1 week. Reason for Disposition  Loss of vision or double vision  (Exception: Similar to previous migraines.)  Answer Assessment - Initial Assessment Questions 1. DESCRIPTION: "Describe your dizziness."     Sees double eyes and cannot walk good has to sit down had blacked out  2. LIGHTHEADED: "Do you feel lightheaded?" (e.g., somewhat faint, woozy, weak upon standing)     Off balance   4. SEVERITY: "How bad is it?"  "Do you feel like you are going to faint?" "Can you stand and walk?"   - MILD: Feels slightly dizzy, but walking normally.   - MODERATE: Feels unsteady when walking, but not falling; interferes with normal activities (e.g., school, work).   - SEVERE: Unable to walk without falling, or requires assistance to walk without falling; feels like passing out now.      moderate 5. ONSET:  "When did the dizziness begin?"     1 week ago  6. AGGRAVATING FACTORS: "Does anything make it worse?" (e.g., standing, change in head position)     Off balance when standing or walking 9. RECURRENT SYMPTOM: "Have you had dizziness before?" If Yes, ask: "When was the last time?" "What happened that time?"     no 10. OTHER SYMPTOMS: "Do you have any other symptoms?" (e.g., fever, chest pain, vomiting, diarrhea, bleeding)       Lost 30 lbs, weakness, fatigue  Protocols used:  Dizziness - Lightheadedness-A-AH

## 2023-09-21 NOTE — H&P (Signed)
 History and Physical    [name]:[id number] DOB: [birth year] DOA: 09/21/2023  PCP: Patient, No Pcp Per  Patient coming from: [facility] ED  Chief Complaint: Dizziness  HPI: [name] Fernandez is a 83 y.o. male with medical history significant of hypothyroidism, hypertension, GERD status post Nissen fundoplication, celiac disease, hyperlipidemia, chronic pain syndrome, CKD stage II, COPD, chronic venous insufficiency, left ankle ulcer, chronic anemia, type 2 diabetes presenting with a chief complaint of dizziness.  Patient reports having frequent "dizzy spells" for the past few weeks which can happen anytime.  Sometimes he is just sitting down when all of a sudden he starts feeling dizzy.  When he tries to walk he feels unsteady.  A week ago he felt dizzy while walking and had an episode of syncope.  Denies any injuries from falling.  Yesterday his dizziness was associated with some nausea but he denies any episodes of vomiting.  Denies abdominal pain.  Denies chest pain, palpitations, or shortness of breath.  Sometimes his dizziness is worse when he looks to the left.  Patient is also reporting unintentional weight loss of 30 pounds over the past few months.  He denies history of hematuria or any bladder pain/discomfort.  Reports 20-pack-year smoking history and quit several years ago.  ED Course: Vital signs on arrival: Temperature 98.7 F, pulse 69, respiratory rate 18, blood pressure 138/96, and SpO2 99% on room air.  Labs showing no leukocytosis, hemoglobin 11.1 (stable), platelet count 451k, glucose 108, creatinine 1.1, UA not suggestive of infection, lipase and LFTs normal, TSH normal.  Chest x-ray negative for acute cardiopulmonary abnormality.  CTA head and neck showing no acute intracranial abnormality and no large vessel occlusion or hemodynamically significant stenosis or aneurysm in the head or neck.  CT abdomen pelvis showing: "IMPRESSION: Nodular area of mass along left side of the  urinary bladder along the opening of the left lateral bladder diverticulum. A bladder neoplasm is possible. Recommend further evaluation.   Nodular liver with fatty infiltration.  Gallstones.   Surgical changes is seen at the diaphragm and left upper quadrant. Patient's history of a fundal location. There is wall thickening along the lower esophagus and upper stomach with some atypical distribution of air to the left of the lower thoracic esophagus. Based on appearance overall and patient's history this could be air within fold along a fundoplication. Please correlate however with any particular esophageal or gastric symptoms and recommend further workup when appropriate."  Patient was given 1 L normal saline.  Review of Systems:  Review of Systems  All other systems reviewed and are negative.   Past Medical History:  Diagnosis Date   Anemia    Celiac disease    diagnoses 06/24/12   Chronic ulcer of ankle (HCC)    left   Esophageal stricture    GERD (gastroesophageal reflux disease)    Hiatal hernia    Hypertension    patient states " no current HTN"   Hypothyroidism    Pre-diabetes    Prediabetes    Shortness of breath    from oxycodone at times   Vitamin B12 deficiency    Vitamin D deficiency     Past Surgical History:  Procedure Laterality Date   APPENDECTOMY     APPLICATION OF WOUND VAC Left 01/12/2023   Procedure: APPLICATION OF WOUND VAC;  Surgeon: Timothy Ford, MD;  Location: [facility] OR;  Service: Orthopedics;  Laterality: Left;   BACK SURGERY     3 diff  surgeries for vertebrea broken   EYE SURGERY     bilateral cataract surgery   HERNIA REPAIR  02/10/1949   bilateral groin as child   HIATAL HERNIA REPAIR     I & D EXTREMITY Left 06/16/2022   Procedure: IRRIGATION AND DEBRIDEMENT ANKLE;  Surgeon: Nadara Mustard, MD;  Location[facility] OR;  Service: Orthopedics;  Laterality: Left;   I & D EXTREMITY Left 07/21/2022   Procedure: LEFT ANKLE DEBRIDEMENT;  Surgeon: Nadara Mustard, MD;  Location: [facility] OR;  Service: Orthopedics;  Laterality: Left;   I & D EXTREMITY Left 11/10/2022   Procedure: LEFT ANKLE DEBRIDEMENT;  Surgeon: Nadara Mustard, MD;  Location: [facility] OR;  Service: Orthopedics;  Laterality: Left;   I & D EXTREMITY Left 01/12/2023   Procedure: LEFT ANKLE DEBRIDEMENT;  Surgeon: Nadara Mustard, MD;  Location: [facility] OR;  Service: Orthopedics;  Laterality: Left;   JOINT REPLACEMENT  04/12/2010   right knee replacement   TONSILLECTOMY     as child   TOTAL KNEE REVISION  11/20/2011   Procedure: TOTAL KNEE REVISION;  Surgeon: Shelda Pal, MD;  Location: [facility] ORS;  Service: Orthopedics;  Laterality: Right;  Right Total Knee Revision     reports that he quit smoking about 43 years ago. His smoking use included cigarettes. He started smoking about 63 years ago. He has a 20 pack-year smoking history. He has been exposed to tobacco smoke. He has never used smokeless tobacco. He reports that he does not drink alcohol and does not use drugs.  Allergies  Allergen Reactions   Feraheme [Ferumoxytol] Swelling and Other (See Comments)    Swelling of lips and tongue, could not talk 30 minutes after the infusion.   Latex Other (See Comments)    Latex butterfly bandages tore off the skin and caused terrible blisters   Sulfa Antibiotics Hives    Severe itching, blisters   Wound Dressing Adhesive Other (See Comments)    Latex butterfly bandages tore off the skin and caused terrible blisters   Levothyroxine Swelling and Other (See Comments)    Lip swelling   Doxycycline Nausea Only    Family History  Problem Relation Age of Onset   Esophageal cancer Father     Prior to Admission medications   Medication Sig Start Date End Date Taking? Authorizing Provider  Ascorbic Acid (VITAMIN C) 1000 MG tablet Take 1,000 mg by mouth daily.    [provider]  betamethasone valerate ointment (VALISONE) 0.1 % Apply 1 Application topically 2 (two) times daily. Patient taking  differently: Apply 1 Application topically daily. 09/25/22   Nadara Mustard, MD  Cholecalciferol (VITAMIN D3) 125 MCG (5000 UT) TABS Take 10,000 Units by mouth daily.    [provider]  clotrimazole-betamethasone (LOTRISONE) cream Apply 1 Application topically daily as needed (Rash). 10/04/22   [provider]  collagenase (SANTYL) 250 UNIT/GM ointment Apply 1 Application topically daily. 7x 30 g tubes Patient not taking: Reported on 05/03/2023 02/08/23   Adonis Huguenin, NP  hydrOXYzine (ATARAX) 25 MG tablet TAKE 1 TABLET 3 x / day  AS NEEDED FOR ANXIETY OR SLEEP                                                             /  TAKE                                         BY                                                 MOUTH 03/14/23   Vangie Genet, MD  leptospermum manuka honey (MEDIHONEY) PSTE paste Apply 1 Application topically daily. Patient not taking: Reported on 05/03/2023 01/25/23   Liane Redman, MD  NON FORMULARY Take 2 capsules by mouth every morning. Thyroid Complete    [provider]  omeprazole (PRILOSEC OTC) 20 MG tablet Take 20 mg by mouth daily before breakfast.    [provider]  senna-docusate (SENOKOT-S) 8.6-50 MG tablet Take 2 tablets by mouth 2 (two) times daily. Patient not taking: Reported on 05/03/2023 06/20/22   Krishnan, Gokul, MD  torsemide [facility]) 20 MG tablet Take 2 tablet  Daily for Fluid Retention / Leg Swelling 05/03/23   Wilkinson, Dana E, NP  triamcinolone cream (KENALOG) 0.1 % Apply 1 Application topically 2 (two) times daily. 02/07/23   Zamora, Erin R, NP  triamcinolone cream (KENALOG) 0.1 % APPLY TO THE LEFT LEG UNDER THE WRAP DAILY WHERE THERE IS ITCHING OR RASH X 2 WEEKS OR UNTIL HEALED 07/26/23   Timothy Ford, MD  zinc gluconate 50 MG tablet Take 50 mg by mouth daily.    [provider]    Physical Exam: Vitals:   09/21/23 2015 09/21/23 2030  09/21/23 2039 09/21/23 2127  BP: (!) 142/80 (!) 140/72 (!) 136/96 (!) 150/95  Pulse:  73 78 79  Resp: 14 12 17 20   Temp:    98.1 F (36.7 C)  TempSrc:    Oral  SpO2:  97% 100% 99%  Weight:      Height:        Physical Exam Vitals reviewed.  Constitutional:      General: He is not in acute distress. HENT:     Head: Normocephalic and atraumatic.  Eyes:     Extraocular Movements: Extraocular movements intact.  Cardiovascular:     Rate and Rhythm: Normal rate and regular rhythm.     Pulses: Normal pulses.  Pulmonary:     Effort: Pulmonary effort is normal. No respiratory distress.     Breath sounds: Normal breath sounds. No wheezing or rales.  Abdominal:     General: Bowel sounds are normal. There is no distension.     Palpations: Abdomen is soft.     Tenderness: There is no abdominal tenderness. There is no guarding.  Musculoskeletal:     Cervical back: Normal range of motion.     Right lower leg: No edema.     Left lower leg: No edema.  Skin:    General: Skin is warm and dry.  Neurological:     General: No focal deficit present.     Mental Status: He is alert and oriented to person, place, and time.     Cranial Nerves: No cranial nerve deficit.     Sensory: No sensory deficit.     Motor: No weakness.     Labs on Admission: I have personally reviewed following labs and imaging studies  CBC: Recent Labs  Lab 09/21/23 1324  WBC 8.3  HGB 11.1*  HCT 33.4*  MCV 87.0  PLT 451*   Basic Metabolic Panel: Recent Labs  Lab 09/21/23 1324  NA 138  K 3.8  CL 102  CO2 28  GLUCOSE 108*  BUN 23  CREATININE 1.10  CALCIUM 9.0   GFR: Estimated Creatinine Clearance: 50.1 mL/min (by C-G formula based on SCr of 1.1 mg/dL). Liver Function Tests: Recent Labs  Lab 09/21/23 1324  AST 28  ALT 22  ALKPHOS 85  BILITOT 0.7  PROT 7.5  ALBUMIN 3.9   Recent Labs  Lab 09/21/23 1324  LIPASE 16   No results for input(s): "AMMONIA" in the last 168 hours. Coagulation  Profile: No results for input(s): "INR", "PROTIME" in the last 168 hours. Cardiac Enzymes: No results for input(s): "CKTOTAL", "CKMB", "CKMBINDEX", "TROPONINI" in the last 168 hours. BNP (last 3 results) No results for input(s): "PROBNP" in the last 8760 hours. HbA1C: No results for input(s): "HGBA1C" in the last 72 hours. CBG: Recent Labs  Lab 09/21/23 1306  GLUCAP 106*   Lipid Profile: No results for input(s): "CHOL", "HDL", "LDLCALC", "TRIG", "CHOLHDL", "LDLDIRECT" in the last 72 hours. Thyroid Function Tests: Recent Labs    09/21/23 1607  TSH 2.100   Anemia Panel: No results for input(s): "VITAMINB12", "FOLATE", "FERRITIN", "TIBC", "IRON", "RETICCTPCT" in the last 72 hours. Urine analysis:    Component Value Date/Time   COLORURINE YELLOW 09/21/2023 1324   APPEARANCEUR CLEAR 09/21/2023 1324   LABSPEC 1.009 09/21/2023 1324   PHURINE 5.5 09/21/2023 1324   GLUCOSEU NEGATIVE 09/21/2023 1324   HGBUR NEGATIVE 09/21/2023 1324   BILIRUBINUR NEGATIVE 09/21/2023 1324   KETONESUR NEGATIVE 09/21/2023 1324   PROTEINUR NEGATIVE 09/21/2023 1324   UROBILINOGEN 0.2 05/20/2014 1108   NITRITE NEGATIVE 09/21/2023 1324   LEUKOCYTESUR NEGATIVE 09/21/2023 1324    Radiological Exams on Admission: DG Chest Port 1 View Result Date: 09/21/2023 CLINICAL DATA:  Chest pain EXAM: PORTABLE CHEST 1 VIEW COMPARISON:  X-ray 08/22/2019 FINDINGS: Numerous surgical clips along the lower mediastinum and left upper abdomen. Underinflation with some interstitial changes, possibly chronic. Normal cardiopericardial silhouette. No pneumothorax, effusion or edema. No consolidation. Overlapping cardiac leads. Osteopenia and degenerative changes. Elevated right humeral head. Please correlate for a evidence of a rotator cuff tear. IMPRESSION: Underinflation with chronic changes.  Surgical clips. Electronically Signed   By: Adrianna Horde M.D.   On: 09/21/2023 18:03   CT ABDOMEN PELVIS W CONTRAST Result Date:  09/21/2023 CLINICAL DATA:  Unintended weight loss.  Neurologic deficit. EXAM: CT ABDOMEN AND PELVIS WITH CONTRAST TECHNIQUE: Multidetector CT imaging of the abdomen and pelvis was performed using the standard protocol following bolus administration of intravenous contrast. RADIATION DOSE REDUCTION: This exam was performed according to the departmental dose-optimization program which includes automated exposure control, adjustment of the mA and/or kV according to patient size and/or use of iterative reconstruction technique. CONTRAST:  100mL OMNIPAQUE IOHEXOL 350 MG/ML SOLN COMPARISON:  Prior chest and spine imaging. FINDINGS: Lower chest: There are areas of interstitial septal thickening along the lung bases. Possible areas of scarring and fibrotic change. No significant pleural effusion. Coronary artery calcifications are noted. Focal hiatal hernia. Adjacent surgical clips along the GE junction. There is some air along the margin of the hernia and lower esophagus along the wall. By history in epic the patient has a history of hiatal hernia repair. On coronal imaging the air is to the left of the lower esophagus and could be within gastric folds. There is some  thickening of the walls of the distal esophagus and upper stomach. Please correlate for any symptoms and further workup when appropriate such as upper GI/esophagram versus endoscopy Hepatobiliary: Slightly nodular contour to the liver. No space-occupying liver lesion. Patent portal vein. Stones in the gallbladder. Pancreas: Severe fatty atrophy of the pancreas. Spleen: Surgical clips seen in the area of the left upper quadrant with a small area of splenic tissue. Please correlate with previous intervention. Adrenals/Urinary Tract: Adrenal glands are preserved. Mild bilateral renal atrophy. No enhancing renal mass. No collecting system dilatation. The ureters have normal course and caliber down to the bladder. 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 At the  diverticulum is a nodular masslike soft tissue focus. On coronal series 4, image 43 this measures 17 mm. An intrinsic bladder neoplasm is possible. There is extension of this nodular tissue into the diverticulum itself on axial series 2, image 81. Overall in the axial plane this measures 19 mm. Stomach/Bowel: On this non oral contrast exam large bowel is nondilated. There are scattered areas of stool particularly in the sigmoid and rectum. Areas along the ascending colon as well. There is a redundant course of the sigmoid colon into the right mid abdomen. There also some portions of the colon such as the descending colon which are collapsed. Stomach is relatively collapsed. Slight fold and wall thickening suggested. Please correlate with symptoms. Small bowel is nondilated. Vascular/Lymphatic: Aortic atherosclerosis. No enlarged abdominal or pelvic lymph nodes. Reproductive: Prostate is unremarkable. Other: No free air or free fluid. There is a midline anterior abdominal wall fat containing hernia with some soft tissue thickening along the hernia sac. Musculoskeletal: Multilevel compression deformities along the thoracic spine with augmentation cement. There is also some compression deformities at L2 and L3. Please correlate with history. Critical Value/emergent results were called by telephone at the time of interpretation on 09/21/2023 at 5:59 pm to provider [facility] , who verbally acknowledged these results. IMPRESSION: Nodular area of mass along left side of the urinary bladder along the opening of the left lateral bladder diverticulum. A bladder neoplasm is possible. Recommend further evaluation. Nodular liver with fatty infiltration.  Gallstones. Surgical changes is seen at the diaphragm and left upper quadrant. Patient's history of a fundal location. There is wall thickening along the lower esophagus and upper stomach with some atypical distribution of air to the left of the lower thoracic esophagus.  Based on appearance overall and patient's history this could be air within fold along a fundoplication. Please correlate however with any particular esophageal or gastric symptoms and recommend further workup when appropriate. Electronically Signed   By: Adrianna Horde M.D.   On: 09/21/2023 18:02   CT ANGIO HEAD NECK W WO CM Result Date: 09/21/2023 CLINICAL DATA:  Neuro deficit, acute, stroke suspected. EXAM: CT ANGIOGRAPHY HEAD AND NECK WITH AND WITHOUT CONTRAST TECHNIQUE: Multidetector CT imaging of the head and neck was performed using the standard protocol during bolus administration of intravenous contrast. Multiplanar CT image reconstructions and MIPs were obtained to evaluate the vascular anatomy. Carotid stenosis measurements (when applicable) are obtained utilizing NASCET criteria, using the distal internal carotid diameter as the denominator. RADIATION DOSE REDUCTION: This exam was performed according to the departmental dose-optimization program which includes automated exposure control, adjustment of the mA and/or kV according to patient size and/or use of iterative reconstruction technique. CONTRAST:  100mL OMNIPAQUE IOHEXOL 350 MG/ML SOLN COMPARISON:  None Available. FINDINGS: CT HEAD FINDINGS Brain: No acute hemorrhage. Cortical gray-white differentiation is preserved.  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 Prominence of the ventricles and sulci within normal limits for age. No extra-axial collection. Basilar cisterns are patent. Vascular: No hyperdense vessel or unexpected calcification. Skull: No calvarial fracture or suspicious bone lesion. Skull base is unremarkable. Sinuses/Orbits: No acute finding. Other: None. Review of the MIP images confirms the above findings CTA NECK FINDINGS Aortic arch: Three-vessel arch configuration. Atherosclerotic calcifications of the aortic arch and arch vessel origins. Arch vessel origins are  patent. Right carotid system: No evidence of dissection, stenosis (50% or greater), or occlusion. Mild calcified plaque along the mid right CCA and carotid bulb. Left carotid system: No evidence of dissection, stenosis (50% or greater), or occlusion. Mild calcified plaque along the left carotid bulb and proximal left cervical ICA. Tortuous distal left cervical ICA. Vertebral arteries: Codominant. No evidence of dissection, stenosis (50% or greater), or occlusion. Skeleton: Mild cervical spondylosis without high-grade spinal canal stenosis. No suspicious bone lesions. Other neck: Unremarkable. Upper chest: Emphysema in the lung apices. Review of the MIP images confirms the above findings CTA HEAD FINDINGS Anterior circulation: Intracranial ICAs are patent without stenosis or aneurysm. The proximal ACAs and MCAs are patent without stenosis or aneurysm. Distal branches are symmetric. Posterior circulation: Normal basilar artery. The SCAs, AICAs and PICAs are patent proximally. The PCAs are patent proximally without stenosis or aneurysm. Distal branches are symmetric. Venous sinuses: As permitted by contrast timing, patent. Anatomic variants: Persistent fetal origin of the bilateral PCAs with hypoplastic P1 segments. Review of the MIP images confirms the above findings IMPRESSION: 1. No acute intracranial abnormality. 2. No large vessel occlusion, hemodynamically significant stenosis, or aneurysm in the head or neck. Aortic Atherosclerosis (ICD10-I70.0) and Emphysema (ICD10-J43.9). Electronically Signed   By: Audra Blend M.D.   On: 09/21/2023 17:04    EKG: Independently reviewed.  Sinus rhythm with occasional PVCs.  No significant change since previous tracing.  Assessment and Plan  Dizziness/vertigo CTA head and neck showing no acute intracranial abnormality and no large vessel occlusion or hemodynamically significant stenosis or aneurysm in the head or neck. -Brain MRI to rule out central causes of  vertigo -Fall precautions  Syncope Patient reports 1 episode of syncope a week ago.  He is not hypoglycemic.  Has chronic mild anemia and hemoglobin stable. CT head negative as mentioned previously.  EKG showing sinus rhythm with occasional PVCs and no acute ischemic changes.  Patient is not endorsing chest pain or shortness of breath.  Chest x-ray negative for acute cardiopulmonary abnormality.  PE less likely given no tachycardia or hypoxia.   -Cardiac monitoring -Echocardiogram -Hold home diuretic and check orthostatics -IV fluid hydration  Significant unintentional weight loss Bladder mass on CT concerning for possible neoplasm Former smoker Patient will need cystoscopy and tissue biopsy for further evaluation.  Consult urology in the morning.  Hypertension Hold antihypertensives at this time and check orthostatics.  GERD status post Nissen fundoplication Patient is not endorsing any abdominal/chest pain or GI symptoms at this time.  Type 2 diabetes Last A1c 6.5 in October 2024, repeat ordered.  Placed on sensitive sliding scale insulin.  Hypothyroidism: TSH normal. Hyperlipidemia COPD: Stable, no signs of acute exacerbation. Pharmacy med rec pending.  DVT prophylaxis: SCDs Code Status: Full Code (discussed with the patient) Family Communication: No family available at this time. Level of care: Telemetry bed Admission status: It is my clinical opinion that referral for OBSERVATION is reasonable and necessary in this patient  based on the above information provided. The aforementioned taken together are felt to place the patient at high risk for further clinical deterioration. However, it is anticipated that the patient may be medically stable for discharge from the hospital within 24 to 48 hours.  Juliette Oh MD [facility]  If 7PM-7AM, please contact night-coverage [email or web address]  09/21/2023, 11:32 PM

## 2023-09-21 NOTE — ED Triage Notes (Signed)
 Dizziness with double vision. Off balance Comes and goes X 1-2 weeks 1 episode of syncope this weeks

## 2023-09-21 NOTE — Telephone Encounter (Signed)
 Wife states "I've already talked to a [facility] and she helped me." No triage needed.

## 2023-09-21 NOTE — ED Notes (Signed)
 Patient transported to CT

## 2023-09-21 NOTE — ED Provider Notes (Signed)
  EMERGENCY DEPARTMENT AT [facility] Provider Note   CSN: [id number] Arrival date & time: 09/21/23  1257     History  Chief Complaint  Patient presents with   Dizziness    [name] Fernandez is a 83 y.o. male.   Dizziness Patient presents with dizziness.  Off-balance.  States he feels unsteady.  Also feels that he will pass out.  Has been going for the last couple weeks.  States over the last couple months he is also lost around 30 pounds without trying.  Decreased appetite. Will occasionally get headaches.  Patient is a remote smoker.  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 Lajoyce Corners for the same.    Past Medical History:  Diagnosis Date   Anemia    Celiac disease    diagnoses 06/24/12   Chronic ulcer of ankle (HCC)    left   Esophageal stricture    GERD (gastroesophageal reflux disease)    Hiatal hernia    Hypertension    patient states " no current HTN"   Hypothyroidism    Pre-diabetes    Prediabetes    Shortness of breath    from oxycodone at times   Vitamin B12 deficiency    Vitamin D deficiency    Past Surgical History:  Procedure Laterality Date   APPENDECTOMY     APPLICATION OF WOUND VAC Left 01/12/2023   Procedure: APPLICATION OF WOUND VAC;  Surgeon: Nadara Mustard, MD;  Location: [facility] OR;  Service: Orthopedics;  Laterality: Left;   BACK SURGERY     3 diff surgeries for vertebrea broken   EYE SURGERY     bilateral cataract surgery   HERNIA REPAIR  [birth year]/10[birth year]1949   bilateral groin as child   HIATAL HERNIA REPAIR     I & D EXTREMITY Left 06/16/2022   Procedure: IRRIGATION AND DEBRIDEMENT ANKLE;  Surgeon: Nadara Mustard, MD;  Location: [facility] OR;  Service: Orthopedics;  Laterality: Left;   I & D EXTREMITY Left 07/21/2022   Procedure: LEFT ANKLE DEBRIDEMENT;  Surgeon: Nadara Mustard, MD;  Location: [facility] OR;  Service: Orthopedics;  Laterality: Left;   I & D EXTREMITY Left 11/10/2022   Procedure: LEFT ANKLE DEBRIDEMENT;  Surgeon: Nadara Mustard, MD;  Location: [facility] OR;  Service: Orthopedics;  Laterality: Left;   I & D EXTREMITY Left 01/12/2023   Procedure: LEFT ANKLE DEBRIDEMENT;  Surgeon: Nadara Mustard, MD;  Location: [facility] OR;  Service: Orthopedics;  Laterality: Left;   JOINT REPLACEMENT  04/12/2010   right knee replacement   TONSILLECTOMY     as child   TOTAL KNEE REVISION  11/20/2011   Procedure: TOTAL KNEE REVISION;  Surgeon: Shelda Pal, MD;  Location: [facility] ORS;  Service: Orthopedics;  Laterality: Right;  Right Total Knee Revision     Home Medications Prior to Admission medications   Medication Sig Start Date End Date Taking? Authorizing Provider  Ascorbic Acid (VITAMIN C) 1000 MG tablet Take 1,000 mg by mouth daily.    [provider]  betamethasone valerate ointment (VALISONE) 0.1 % Apply 1 Application topically 2 (two) times daily. Patient taking differently: Apply 1 Application topically daily. 09/25/22   Nadara Mustard, MD  Cholecalciferol (VITAMIN D3) 125 MCG (5000 UT) TABS Take 10,000 Units by mouth daily.    [provider]  clotrimazole-betamethasone (LOTRISONE) cream Apply 1 Application topically daily as needed (Rash). 10/04/22   [provider]  collagenase (SANTYL) 250 UNIT/GM  ointment Apply 1 Application topically daily. 7x 30 g tubes Patient not taking: Reported on 05/03/2023 02/08/23   Adonis Huguenin, NP  hydrOXYzine (ATARAX) 25 MG tablet TAKE 1 TABLET 3 x / day  AS NEEDED FOR ANXIETY OR SLEEP                                                             /                                                                   TAKE                                         BY                                                 MOUTH 03/14/23   Lucky Cowboy, MD  leptospermum manuka honey (MEDIHONEY) PSTE paste Apply 1 Application topically daily. Patient not taking: Reported on 05/03/2023 01/25/23   Judyann Munson, MD  NON FORMULARY Take 2 capsules by mouth every morning. Thyroid Complete    [provider]  omeprazole (PRILOSEC OTC) 20 MG tablet Take 20 mg by mouth daily before breakfast.    [provider]  senna-docusate (SENOKOT-S) 8.6-50 MG tablet Take 2 tablets by mouth 2 (two) times daily. Patient not taking: Reported on 05/03/2023 06/20/22   Osvaldo Shipper, MD  torsemide [facility]) 20 MG tablet Take 2 tablet  Daily for Fluid Retention / Leg Swelling 05/03/23   Raynelle Dick, NP  triamcinolone cream (KENALOG) 0.1 % Apply 1 Application topically 2 (two) times daily. 02/07/23   Adonis Huguenin, NP  triamcinolone cream (KENALOG) 0.1 % APPLY TO THE LEFT LEG UNDER THE WRAP DAILY WHERE THERE IS ITCHING OR RASH X 2 WEEKS OR UNTIL HEALED 07/26/23   Nadara Mustard, MD  zinc gluconate 50 MG tablet Take 50 mg by mouth daily.    [provider]      Allergies    Feraheme [ferumoxytol], Latex, Sulfa antibiotics, Wound dressing adhesive, Levothyroxine, and Doxycycline    Review of Systems   Review of Systems  Neurological:  Positive for dizziness.    Physical Exam Updated Vital Signs BP (!) 141/78   Pulse 70   Temp 97.8 F (36.6 C)   Resp 19   Ht 5\' 8"  (1.727 m)   Wt 80.7 kg   SpO2 97%   BMI 27.06 kg/m  Physical Exam Vitals and nursing note reviewed.  Eyes:     Extraocular Movements: Extraocular movements intact.     Pupils: Pupils are equal, round, and reactive to light.     Comments: Conjugate eye movements.  No severe nystagmus.  Cardiovascular:     Rate and Rhythm: Regular rhythm.  Pulmonary:  Breath sounds: No wheezing or rhonchi.  Musculoskeletal:     Cervical back: Neck supple.  Neurological:     Mental Status: He is alert and oriented to person, place, and time.     Cranial Nerves: No cranial nerve deficit.     ED Results / Procedures / Treatments   Labs (all labs ordered are listed, but only abnormal results are displayed) Labs Reviewed  BASIC METABOLIC PANEL WITH GFR - Abnormal; Notable for the following components:      Result  Value   Glucose, Bld 108 (*)    All other components within normal limits  CBC - Abnormal; Notable for the following components:   RBC 3.84 (*)    Hemoglobin 11.1 (*)    HCT 33.4 (*)    RDW 16.4 (*)    Platelets 451 (*)    All other components within normal limits  CBG MONITORING, ED - Abnormal; Notable for the following components:   Glucose-Capillary 106 (*)    All other components within normal limits  URINALYSIS, ROUTINE W REFLEX MICROSCOPIC  HEPATIC FUNCTION PANEL  LIPASE, BLOOD  TSH  CBG MONITORING, ED    EKG EKG Interpretation Date/Time:  Friday September 21 2023 13:22:43 EDT Ventricular Rate:  66 PR Interval:  200 QRS Duration:  86 QT Interval:  404 QTC Calculation: 423 R Axis:   -4  Text Interpretation: Sinus rhythm with occasional Premature ventricular complexes Otherwise normal ECG When compared with ECG of 04-May-2010 15:23, Premature ventricular complexes are now Present T wave amplitude has decreased in Anterior leads Confirmed by Benjiman Core [phone]) on 09/21/2023 3:13:37 PM  Radiology DG Chest Port 1 View Result Date: 09/21/2023 CLINICAL DATA:  Chest pain EXAM: PORTABLE CHEST 1 VIEW COMPARISON:  X-ray 08/22/2019 FINDINGS: Numerous surgical clips along the lower mediastinum and left upper abdomen. Underinflation with some interstitial changes, possibly chronic. Normal cardiopericardial silhouette. No pneumothorax, effusion or edema. No consolidation. Overlapping cardiac leads. Osteopenia and degenerative changes. Elevated right humeral head. Please correlate for a evidence of a rotator cuff tear. IMPRESSION: Underinflation with chronic changes.  Surgical clips. Electronically Signed   By: Karen Kays M.D.   On: 09/21/2023 18:03   CT ABDOMEN PELVIS W CONTRAST Result Date: 09/21/2023 CLINICAL DATA:  Unintended weight loss.  Neurologic deficit. EXAM: CT ABDOMEN AND PELVIS WITH CONTRAST TECHNIQUE: Multidetector CT imaging of the abdomen and pelvis was performed using  the standard protocol following bolus administration of intravenous contrast. RADIATION DOSE REDUCTION: This exam was performed according to the departmental dose-optimization program which includes automated exposure control, adjustment of the mA and/or kV according to patient size and/or use of iterative reconstruction technique. CONTRAST:  OMNIPAQUE IOHEXOL 350 MG/ML SOLN COMPARISON:  Prior chest and spine imaging. FINDINGS: Lower chest: There are areas of interstitial septal thickening along the lung bases. Possible areas of scarring and fibrotic change. No significant pleural effusion. Coronary artery calcifications are noted. Focal hiatal hernia. Adjacent surgical clips along the GE junction. There is some air along the margin of the hernia and lower esophagus along the wall. By history in epic the patient has a history of hiatal hernia repair. On coronal imaging the air is to the left of the lower esophagus and could be within gastric folds. There is some thickening of the walls of the distal esophagus and upper stomach. Please correlate for any symptoms and further workup when appropriate such as upper GI/esophagram versus endoscopy Hepatobiliary: Slightly nodular contour to the liver. No space-occupying liver lesion.  Patent portal vein. Stones in the gallbladder. Pancreas: Severe fatty atrophy of the pancreas. Spleen: Surgical clips seen in the area of the left upper quadrant with a small area of splenic tissue. Please correlate with previous intervention. Adrenals/Urinary Tract: Adrenal glands are preserved. Mild bilateral renal atrophy. No enhancing renal mass. No collecting system dilatation. The ureters have normal course and caliber down to the bladder. 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 At the diverticulum is a nodular masslike soft tissue focus. On coronal series 4, image 43 this measures 17 mm. An intrinsic bladder neoplasm is possible. There is extension of this nodular tissue into  the diverticulum itself on axial series 2, image 81. Overall in the axial plane this measures 19 mm. Stomach/Bowel: On this non oral contrast exam large bowel is nondilated. There are scattered areas of stool particularly in the sigmoid and rectum. Areas along the ascending colon as well. There is a redundant course of the sigmoid colon into the right mid abdomen. There also some portions of the colon such as the descending colon which are collapsed. Stomach is relatively collapsed. Slight fold and wall thickening suggested. Please correlate with symptoms. Small bowel is nondilated. Vascular/Lymphatic: Aortic atherosclerosis. No enlarged abdominal or pelvic lymph nodes. Reproductive: Prostate is unremarkable. Other: No free air or free fluid. There is a midline anterior abdominal wall fat containing hernia with some soft tissue thickening along the hernia sac. Musculoskeletal: Multilevel compression deformities along the thoracic spine with augmentation cement. There is also some compression deformities at L2 and L3. Please correlate with history. Critical Value/emergent results were called by telephone at the time of interpretation on 09/21/2023 at 5:59 pm to provider [facility] , who verbally acknowledged these results. IMPRESSION: Nodular area of mass along left side of the urinary bladder along the opening of the left lateral bladder diverticulum. A bladder neoplasm is possible. Recommend further evaluation. Nodular liver with fatty infiltration.  Gallstones. Surgical changes is seen at the diaphragm and left upper quadrant. Patient's history of a fundal location. There is wall thickening along the lower esophagus and upper stomach with some atypical distribution of air to the left of the lower thoracic esophagus. Based on appearance overall and patient's history this could be air within fold along a fundoplication. Please correlate however with any particular esophageal or gastric symptoms and recommend  further workup when appropriate. Electronically Signed   By: Karen Kays M.D.   On: 09/21/2023 18:02   CT ANGIO HEAD NECK W WO CM Result Date: 09/21/2023 CLINICAL DATA:  Neuro deficit, acute, stroke suspected. EXAM: CT ANGIOGRAPHY HEAD AND NECK WITH AND WITHOUT CONTRAST TECHNIQUE: Multidetector CT imaging of the head and neck was performed using the standard protocol during bolus administration of intravenous contrast. Multiplanar CT image reconstructions and MIPs were obtained to evaluate the vascular anatomy. Carotid stenosis measurements (when applicable) are obtained utilizing NASCET criteria, using the distal internal carotid diameter as the denominator. RADIATION DOSE REDUCTION: This exam was performed according to the departmental dose-optimization program which includes automated exposure control, adjustment of the mA and/or kV according to patient size and/or use of iterative reconstruction technique. CONTRAST:  OMNIPAQUE IOHEXOL 350 MG/ML SOLN COMPARISON:  None Available. FINDINGS: CT HEAD FINDINGS Brain: No acute hemorrhage. Cortical gray-white differentiation is preserved. 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 Prominence of the ventricles and sulci within normal limits for age. No extra-axial collection. Basilar cisterns are patent. Vascular: No hyperdense vessel or unexpected  calcification. Skull: No calvarial fracture or suspicious bone lesion. Skull base is unremarkable. Sinuses/Orbits: No acute finding. Other: None. Review of the MIP images confirms the above findings CTA NECK FINDINGS Aortic arch: Three-vessel arch configuration. Atherosclerotic calcifications of the aortic arch and arch vessel origins. Arch vessel origins are patent. Right carotid system: No evidence of dissection, stenosis (50% or greater), or occlusion. Mild calcified plaque along the mid right CCA and carotid bulb. Left carotid system: No evidence  of dissection, stenosis (50% or greater), or occlusion. Mild calcified plaque along the left carotid bulb and proximal left cervical ICA. Tortuous distal left cervical ICA. Vertebral arteries: Codominant. No evidence of dissection, stenosis (50% or greater), or occlusion. Skeleton: Mild cervical spondylosis without high-grade spinal canal stenosis. No suspicious bone lesions. Other neck: Unremarkable. Upper chest: Emphysema in the lung apices. Review of the MIP images confirms the above findings CTA HEAD FINDINGS Anterior circulation: Intracranial ICAs are patent without stenosis or aneurysm. The proximal ACAs and MCAs are patent without stenosis or aneurysm. Distal branches are symmetric. Posterior circulation: Normal basilar artery. The SCAs, AICAs and PICAs are patent proximally. The PCAs are patent proximally without stenosis or aneurysm. Distal branches are symmetric. Venous sinuses: As permitted by contrast timing, patent. Anatomic variants: Persistent fetal origin of the bilateral PCAs with hypoplastic P1 segments. Review of the MIP images confirms the above findings IMPRESSION: 1. No acute intracranial abnormality. 2. No large vessel occlusion, hemodynamically significant stenosis, or aneurysm in the head or neck. Aortic Atherosclerosis (ICD10-I70.0) and Emphysema (ICD10-J43.9). Electronically Signed   By: Orvan Falconer M.D.   On: 09/21/2023 17:04    Procedures Procedures    Medications Ordered in ED Medications  sodium chloride 0.9 % bolus 1,000 mL (has no administration in time range)  iohexol (OMNIPAQUE) 350 MG/ML injection 100 mL (100 mLs Intravenous Contrast Given 09/21/23 1616)    ED Course/ Medical Decision Making/ A&P                                 Medical Decision Making Amount and/or Complexity of Data Reviewed Labs: ordered. Radiology: ordered.  Risk Prescription drug management.   Patient with dizziness weakness and syncope.  Differential diagnoses long but includes  cause such as dehydration, arrhythmia.  Also describing it as things moving around does consider vertigo.  However it is more worrisome with his unintended weight loss.  Will get CT of the chest abdomen pelvis.  Will get CTA of the head and neck.  Reviewed notes and had a abdomen pelvis CT a couple years ago that showed potentially pancreatic mass.  Had follow-up MRI that did not show anything but wanted follow-up which was not done after that.  Blood work is reassuring.  Head CT reassuring along with CT angiogram.  However discussed with Dr. Excell Seltzer from radiology about the abdomen CT.  Has potential bladder mass but also has esophageal findings.  Discussed with patient has had 3 different "hernia" surgeries.  Sounds as if it was hiatal hernia surgery.  Potentially could be a little looser as a cause of the air in the paraesophageal area.  His dizziness does sound somewhat orthostatic but also states he has episodes where he feels that the room was moving.  With age and comorbidities I feel he would benefit from more acute workup for central cause of the dizziness.  Not a TNK candidate due to on and off symptoms and no deficits  at this time.  Does not have a PCP anymore.  Will discuss with hospitalist for admission.          Final Clinical Impression(s) / ED Diagnoses Final diagnoses:  Dizziness  Bladder mass    Rx / DC Orders ED Discharge Orders     None         Benjiman Core, MD 09/21/23 1826

## 2023-09-21 NOTE — ED Notes (Signed)
 Patient transported by [facility] via stretcher. VSS. Patient and family given room number and unit phone number.

## 2023-09-21 NOTE — Progress Notes (Signed)
 Dr Rubin Payor giving report:  Admit for r/o stroke. Need mri, possible urology (see below) and syncope work up.  Vertiginous dizzynes (days). Non focal now. CTA head wnl. 30 # weigth loss in 60 days. ?? Bladder ca on CT scan. Some esophageal surgery in the past ??nissen fundoplication. No trouble eating . Also had syncope episode X few days ago.   --------------------------------------- Labs on Admission:  Results for orders placed or performed during the hospital encounter of 09/21/23 (from the past 24 hours)  CBG monitoring, ED     Status: Abnormal   Collection Time: 09/21/23  1:06 PM  Result Value Ref Range   Glucose-Capillary 106 (H) 70 - 99 mg/dL  Basic metabolic panel     Status: Abnormal   Collection Time: 09/21/23  1:24 PM  Result Value Ref Range   Sodium 138 135 - 145 mmol/L   Potassium 3.8 3.5 - 5.1 mmol/L   Chloride 102 98 - 111 mmol/L   CO2 28 22 - 32 mmol/L   Glucose, Bld 108 (H) 70 - 99 mg/dL   BUN 23 8 - 23 mg/dL   Creatinine, Ser 7.82 0.61 - 1.24 mg/dL   Calcium 9.0 8.9 - 95.6 mg/dL   GFR, Estimated >21 >30 mL/min   Anion gap 8 5 - 15  CBC     Status: Abnormal   Collection Time: 09/21/23  1:24 PM  Result Value Ref Range   WBC 8.3 4.0 - 10.5 K/uL   RBC 3.84 (L) 4.22 - 5.81 MIL/uL   Hemoglobin 11.1 (L) 13.0 - 17.0 g/dL   HCT 86.5 (L) 78.4 - 69.6 %   MCV 87.0 80.0 - 100.0 fL   MCH 28.9 26.0 - 34.0 pg   MCHC 33.2 30.0 - 36.0 g/dL   RDW 29.5 (H) 28.4 - 13.2 %   Platelets 451 (H) 150 - 400 K/uL   nRBC 0.0 0.0 - 0.2 %  Urinalysis, Routine w reflex microscopic -Urine, Clean Catch     Status: None   Collection Time: 09/21/23  1:24 PM  Result Value Ref Range   Color, Urine YELLOW YELLOW   APPearance CLEAR CLEAR   Specific Gravity, Urine 1.009 1.005 - 1.030   pH 5.5 5.0 - 8.0   Glucose, UA NEGATIVE NEGATIVE mg/dL   Hgb urine dipstick NEGATIVE NEGATIVE   Bilirubin Urine NEGATIVE NEGATIVE   Ketones, ur NEGATIVE NEGATIVE mg/dL   Protein, ur NEGATIVE NEGATIVE mg/dL    Nitrite NEGATIVE NEGATIVE   Leukocytes,Ua NEGATIVE NEGATIVE  Hepatic function panel     Status: None   Collection Time: 09/21/23  1:24 PM  Result Value Ref Range   Total Protein 7.5 6.5 - 8.1 g/dL   Albumin 3.9 3.5 - 5.0 g/dL   AST 28 15 - 41 U/L   ALT 22 0 - 44 U/L   Alkaline Phosphatase 85 38 - 126 U/L   Total Bilirubin 0.7 0.0 - 1.2 mg/dL   Bilirubin, Direct 0.2 0.0 - 0.2 mg/dL   Indirect Bilirubin 0.5 0.3 - 0.9 mg/dL  Lipase, blood     Status: None   Collection Time: 09/21/23  1:24 PM  Result Value Ref Range   Lipase 16 11 - 51 U/L  TSH     Status: None   Collection Time: 09/21/23  4:07 PM  Result Value Ref Range   TSH 2.100 0.350 - 4.500 uIU/mL   Basic Metabolic Panel: Recent Labs  Lab 09/21/23 1324  NA 138  K 3.8  CL 102  CO2 28  GLUCOSE 108*  BUN 23  CREATININE 1.10  CALCIUM 9.0   Liver Function Tests: Recent Labs  Lab 09/21/23 1324  AST 28  ALT 22  ALKPHOS 85  BILITOT 0.7  PROT 7.5  ALBUMIN 3.9   Recent Labs  Lab 09/21/23 1324  LIPASE 16   No results for input(s): "AMMONIA" in the last 168 hours. CBC: Recent Labs  Lab 09/21/23 1324  WBC 8.3  HGB 11.1*  HCT 33.4*  MCV 87.0  PLT 451*   Cardiac Enzymes: No results for input(s): "CKTOTAL", "CKMB", "CKMBINDEX", "TROPONINIHS" in the last 168 hours.  BNP (last 3 results) No results for input(s): "PROBNP" in the last 8760 hours. CBG: Recent Labs  Lab 09/21/23 1306  GLUCAP 106*    Radiological Exams on Admission:  DG Chest Port 1 View Result Date: 09/21/2023 CLINICAL DATA:  Chest pain EXAM: PORTABLE CHEST 1 VIEW COMPARISON:  X-ray 08/22/2019 FINDINGS: Numerous surgical clips along the lower mediastinum and left upper abdomen. Underinflation with some interstitial changes, possibly chronic. Normal cardiopericardial silhouette. No pneumothorax, effusion or edema. No consolidation. Overlapping cardiac leads. Osteopenia and degenerative changes. Elevated right humeral head. Please correlate  for a evidence of a rotator cuff tear. IMPRESSION: Underinflation with chronic changes.  Surgical clips. Electronically Signed   By: Karen Kays M.D.   On: 09/21/2023 18:03   CT ABDOMEN PELVIS W CONTRAST Result Date: 09/21/2023 CLINICAL DATA:  Unintended weight loss.  Neurologic deficit. EXAM: CT ABDOMEN AND PELVIS WITH CONTRAST TECHNIQUE: Multidetector CT imaging of the abdomen and pelvis was performed using the standard protocol following bolus administration of intravenous contrast. RADIATION DOSE REDUCTION: This exam was performed according to the departmental dose-optimization program which includes automated exposure control, adjustment of the mA and/or kV according to patient size and/or use of iterative reconstruction technique. CONTRAST:  OMNIPAQUE IOHEXOL 350 MG/ML SOLN COMPARISON:  Prior chest and spine imaging. FINDINGS: Lower chest: There are areas of interstitial septal thickening along the lung bases. Possible areas of scarring and fibrotic change. No significant pleural effusion. Coronary artery calcifications are noted. Focal hiatal hernia. Adjacent surgical clips along the GE junction. There is some air along the margin of the hernia and lower esophagus along the wall. By history in epic the patient has a history of hiatal hernia repair. On coronal imaging the air is to the left of the lower esophagus and could be within gastric folds. There is some thickening of the walls of the distal esophagus and upper stomach. Please correlate for any symptoms and further workup when appropriate such as upper GI/esophagram versus endoscopy Hepatobiliary: Slightly nodular contour to the liver. No space-occupying liver lesion. Patent portal vein. Stones in the gallbladder. Pancreas: Severe fatty atrophy of the pancreas. Spleen: Surgical clips seen in the area of the left upper quadrant with a small area of splenic tissue. Please correlate with previous intervention. Adrenals/Urinary Tract: Adrenal  glands are preserved. Mild bilateral renal atrophy. No enhancing renal mass. No collecting system dilatation. The ureters have normal course and caliber down to the bladder. 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 At the diverticulum is a nodular masslike soft tissue focus. On coronal series 4, image 43 this measures 17 mm. An intrinsic bladder neoplasm is possible. There is extension of this nodular tissue into the diverticulum itself on axial series 2, image 81. Overall in the axial plane this measures 19 mm. Stomach/Bowel: On this non oral contrast exam large bowel is nondilated. There are scattered areas of  stool particularly in the sigmoid and rectum. Areas along the ascending colon as well. There is a redundant course of the sigmoid colon into the right mid abdomen. There also some portions of the colon such as the descending colon which are collapsed. Stomach is relatively collapsed. Slight fold and wall thickening suggested. Please correlate with symptoms. Small bowel is nondilated. Vascular/Lymphatic: Aortic atherosclerosis. No enlarged abdominal or pelvic lymph nodes. Reproductive: Prostate is unremarkable. Other: No free air or free fluid. There is a midline anterior abdominal wall fat containing hernia with some soft tissue thickening along the hernia sac. Musculoskeletal: Multilevel compression deformities along the thoracic spine with augmentation cement. There is also some compression deformities at L2 and L3. Please correlate with history. Critical Value/emergent results were called by telephone at the time of interpretation on 09/21/2023 at 5:59 pm to provider [facility] , who verbally acknowledged these results. IMPRESSION: Nodular area of mass along left side of the urinary bladder along the opening of the left lateral bladder diverticulum. A bladder neoplasm is possible. Recommend further evaluation. Nodular liver with fatty infiltration.  Gallstones. Surgical changes is seen at the  diaphragm and left upper quadrant. Patient's history of a fundal location. There is wall thickening along the lower esophagus and upper stomach with some atypical distribution of air to the left of the lower thoracic esophagus. Based on appearance overall and patient's history this could be air within fold along a fundoplication. Please correlate however with any particular esophageal or gastric symptoms and recommend further workup when appropriate. Electronically Signed   By: Karen Kays M.D.   On: 09/21/2023 18:02   CT ANGIO HEAD NECK W WO CM Result Date: 09/21/2023 CLINICAL DATA:  Neuro deficit, acute, stroke suspected. EXAM: CT ANGIOGRAPHY HEAD AND NECK WITH AND WITHOUT CONTRAST TECHNIQUE: Multidetector CT imaging of the head and neck was performed using the standard protocol during bolus administration of intravenous contrast. Multiplanar CT image reconstructions and MIPs were obtained to evaluate the vascular anatomy. Carotid stenosis measurements (when applicable) are obtained utilizing NASCET criteria, using the distal internal carotid diameter as the denominator. RADIATION DOSE REDUCTION: This exam was performed according to the departmental dose-optimization program which includes automated exposure control, adjustment of the mA and/or kV according to patient size and/or use of iterative reconstruction technique. CONTRAST:  OMNIPAQUE IOHEXOL 350 MG/ML SOLN COMPARISON:  None Available. FINDINGS: CT HEAD FINDINGS Brain: No acute hemorrhage. Cortical gray-white differentiation is preserved. 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 Prominence of the ventricles and sulci within normal limits for age. No extra-axial collection. Basilar cisterns are patent. Vascular: No hyperdense vessel or unexpected calcification. Skull: No calvarial fracture or suspicious bone lesion. Skull base is unremarkable. Sinuses/Orbits: No acute finding.  Other: None. Review of the MIP images confirms the above findings CTA NECK FINDINGS Aortic arch: Three-vessel arch configuration. Atherosclerotic calcifications of the aortic arch and arch vessel origins. Arch vessel origins are patent. Right carotid system: No evidence of dissection, stenosis (50% or greater), or occlusion. Mild calcified plaque along the mid right CCA and carotid bulb. Left carotid system: No evidence of dissection, stenosis (50% or greater), or occlusion. Mild calcified plaque along the left carotid bulb and proximal left cervical ICA. Tortuous distal left cervical ICA. Vertebral arteries: Codominant. No evidence of dissection, stenosis (50% or greater), or occlusion. Skeleton: Mild cervical spondylosis without high-grade spinal canal stenosis. No suspicious bone lesions. Other neck: Unremarkable. Upper chest: Emphysema in the lung  apices. Review of the MIP images confirms the above findings CTA HEAD FINDINGS Anterior circulation: Intracranial ICAs are patent without stenosis or aneurysm. The proximal ACAs and MCAs are patent without stenosis or aneurysm. Distal branches are symmetric. Posterior circulation: Normal basilar artery. The SCAs, AICAs and PICAs are patent proximally. The PCAs are patent proximally without stenosis or aneurysm. Distal branches are symmetric. Venous sinuses: As permitted by contrast timing, patent. Anatomic variants: Persistent fetal origin of the bilateral PCAs with hypoplastic P1 segments. Review of the MIP images confirms the above findings IMPRESSION: 1. No acute intracranial abnormality. 2. No large vessel occlusion, hemodynamically significant stenosis, or aneurysm in the head or neck. Aortic Atherosclerosis (ICD10-I70.0) and Emphysema (ICD10-J43.9). Electronically Signed   By: Orvan Falconer M.D.   On: 09/21/2023 17:04      No intake/output data recorded. No intake/output data recorded.

## 2023-09-22 ENCOUNTER — Observation Stay (HOSPITAL_COMMUNITY)

## 2023-09-22 DIAGNOSIS — R634 Abnormal weight loss: Secondary | ICD-10-CM

## 2023-09-22 DIAGNOSIS — N3289 Other specified disorders of bladder: Secondary | ICD-10-CM

## 2023-09-22 DIAGNOSIS — I6782 Cerebral ischemia: Secondary | ICD-10-CM | POA: Diagnosis not present

## 2023-09-22 DIAGNOSIS — K219 Gastro-esophageal reflux disease without esophagitis: Secondary | ICD-10-CM

## 2023-09-22 DIAGNOSIS — R55 Syncope and collapse: Secondary | ICD-10-CM

## 2023-09-22 DIAGNOSIS — R42 Dizziness and giddiness: Secondary | ICD-10-CM | POA: Diagnosis not present

## 2023-09-22 DIAGNOSIS — I10 Essential (primary) hypertension: Secondary | ICD-10-CM | POA: Diagnosis not present

## 2023-09-22 LAB — GLUCOSE, CAPILLARY
Glucose-Capillary: 83 mg/dL (ref 70–99)
Glucose-Capillary: 71 mg/dL (ref 70–99)
Glucose-Capillary: 85 mg/dL (ref 70–99)
Glucose-Capillary: 79 mg/dL (ref 70–99)
Glucose-Capillary: 159 mg/dL — ABNORMAL HIGH (ref 70–99)

## 2023-09-22 LAB — HEMOGLOBIN A1C
Hgb A1c MFr Bld: 6.2 % — ABNORMAL HIGH (ref 4.8–5.6)
Mean Plasma Glucose: 131.24 mg/dL

## 2023-09-22 MED ORDER — HYDROXYZINE HCL 25 MG PO TABS
25.0000 mg | ORAL_TABLET | Freq: Once | ORAL | Status: AC | PRN
  Administered 2023-09-22: 25 mg via ORAL
  Filled 2023-09-22: qty 1

## 2023-09-22 MED ORDER — INSULIN ASPART 100 UNIT/ML IJ SOLN: 0.0000 [IU] | INTRAMUSCULAR | Status: DC

## 2023-09-22 MED ORDER — DOXEPIN HCL 10 MG PO CAPS: 10.0000 mg | ORAL_CAPSULE | Freq: Every evening | ORAL | Status: DC | PRN

## 2023-09-22 MED ORDER — BETAMETHASONE VALERATE 0.1 % EX OINT
1.0000 | TOPICAL_OINTMENT | Freq: Two times a day (BID) | CUTANEOUS | Status: DC
Start: 2023-09-22 — End: 2023-09-22

## 2023-09-22 MED ORDER — IPRATROPIUM-ALBUTEROL 0.5-2.5 (3) MG/3ML IN SOLN: 3.0000 mL | RESPIRATORY_TRACT | Status: DC | PRN

## 2023-09-22 MED ORDER — LEVOCETIRIZINE DIHYDROCHLORIDE 5 MG PO TABS: 5.0000 mg | ORAL_TABLET | Freq: Every day | ORAL | Status: DC

## 2023-09-22 MED ORDER — INSULIN ASPART 100 UNIT/ML IJ SOLN: 0.0000 [IU] | Freq: Three times a day (TID) | INTRAMUSCULAR | Status: DC

## 2023-09-22 MED ORDER — PANTOPRAZOLE SODIUM 40 MG PO TBEC
40.0000 mg | DELAYED_RELEASE_TABLET | Freq: Every day | ORAL | Status: DC
  Administered 2023-09-22: 40 mg via ORAL
  Filled 2023-09-22: qty 1

## 2023-09-22 MED ORDER — SODIUM CHLORIDE 0.9 % IV SOLN: INTRAVENOUS | Status: AC

## 2023-09-22 MED ORDER — CLOBETASOL PROPIONATE 0.05 % EX OINT
TOPICAL_OINTMENT | Freq: Two times a day (BID) | CUTANEOUS | Status: DC
Start: 2023-09-22 — End: 2023-09-23
  Filled 2023-09-22: qty 15

## 2023-09-22 MED ORDER — ACETAMINOPHEN 650 MG RE SUPP: 650.0000 mg | Freq: Four times a day (QID) | RECTAL | Status: DC | PRN

## 2023-09-22 MED ORDER — LORATADINE 10 MG PO TABS
10.0000 mg | ORAL_TABLET | Freq: Every day | ORAL | Status: DC
  Administered 2023-09-22: 10 mg via ORAL
  Filled 2023-09-22: qty 1

## 2023-09-22 MED ORDER — ACETAMINOPHEN 325 MG PO TABS: 650.0000 mg | ORAL_TABLET | Freq: Four times a day (QID) | ORAL | Status: DC | PRN

## 2023-09-22 MED ORDER — ENOXAPARIN SODIUM 40 MG/0.4ML IJ SOSY
40.0000 mg | PREFILLED_SYRINGE | INTRAMUSCULAR | Status: DC
  Administered 2023-09-22: 40 mg via SUBCUTANEOUS
  Filled 2023-09-22: qty 0.4

## 2023-09-22 NOTE — Plan of Care (Signed)
  Problem: Education: Goal: Knowledge of General Education information will improve Description: Including pain rating scale, medication(s)/side effects and non-pharmacologic comfort measures Outcome: Progressing   Problem: Health Behavior/Discharge Planning: Goal: Ability to manage health-related needs will improve Outcome: Progressing   Problem: Clinical Measurements: Goal: Ability to maintain clinical measurements within normal limits will improve Outcome: Progressing Goal: Will remain free from infection Outcome: Progressing   Problem: Activity: Goal: Risk for activity intolerance will decrease Outcome: Progressing   Problem: Nutrition: Goal: Adequate nutrition will be maintained Outcome: Progressing   Problem: Coping: Goal: Level of anxiety will decrease Outcome: Progressing   Problem: Skin Integrity: Goal: Risk for impaired skin integrity will decrease Outcome: Progressing   

## 2023-09-22 NOTE — Progress Notes (Signed)
   09/22/23 0303  Vitals  Pulse Rate 75  ECG Heart Rate 76  Resp 17  MEWS COLOR  MEWS Score Color Green  Orthostatic Lying   BP- Lying 130/85  Pulse- Lying 72  Orthostatic Sitting  BP- Sitting 133/84  Pulse- Sitting 76  Orthostatic Standing at 0 minutes  BP- Standing at 0 minutes 105/68  Pulse- Standing at 0 minutes 84  Orthostatic Standing at 3 minutes  BP- Standing at 3 minutes 124/79  Pulse- Standing at 3 minutes 85  Oxygen Therapy  SpO2 96 %  MEWS Score  MEWS Temp 0  MEWS Systolic 0  MEWS Pulse 0  MEWS RR 0  MEWS LOC 0  MEWS Score 0

## 2023-09-22 NOTE — Evaluation (Addendum)
 Physical Therapy Evaluation Patient Details Name: [name] Fernandez MRN: [id number] DOB: 04/08/1941 Today's Date: 09/22/2023  History of Present Illness  83 y.o. male presented 4/11 with 30 pound unintentional weight loss in a period of 3 months-along with poor oral intake-he became dizzy for the past several days along. Had a Syncopal episode-hence presented to the ED for further evaluation. PMHx:  hypertension, celiac disease, HLD, chronic left ankle ulcer/chronic venous insufficiency, DM-2, GERD-s/p Nissen's fundoplication.   Clinical Impression  Pt admitted with above complications. Typically mod I with mobility, using SPC. Lives with wife who was present and supportive during evaluation. States he has felt off balance for the past couple of weeks. Denies true vertigo. Had two episodes of lightheadedness while walking resulting in syncope and fall. Symptoms not exacerbated by rolling over or supine<>sit transitions. He reports a few episodes of spontaneous diploplia while sitting at rest over the past week and lasts about 5 minutes before resolving. Able to ambulate without feeling lightheaded today, using RW for support, minor instability, and reports feeling more off balance than usual. He is agreeable to use RW rather than [facility] for now.  Pt currently with functional limitations due to the deficits listed below (see PT Problem List). Pt will benefit from acute skilled PT to increase their independence and safety with mobility to allow discharge.         09/22/23 1233  Orthostatic Lying   BP- Lying 134/74  Pulse- Lying 86  Orthostatic Sitting  BP- Sitting 110/73  Pulse- Sitting 94  Orthostatic Standing at 0 minutes  BP- Standing at 0 minutes 120/65  Pulse- Standing at 0 minutes 96  Orthostatic Standing at 3 minutes  BP- Standing at 3 minutes 121/73  Pulse- Standing at 3 minutes 100     Head impulse test negative, no nystagmus noted, skew negative. Visual tracking WFL. FNF WNL, sensation  in tact UEs and LEs with light touch.        If plan is discharge home, recommend the following: Assist for transportation;Help with stairs or ramp for entrance   Can travel by private vehicle        Equipment Recommendations None recommended by PT  Recommendations for Other Services       Functional Status Assessment Patient has had a recent decline in their functional status and demonstrates the ability to make significant improvements in function in a reasonable and predictable amount of time.     Precautions / Restrictions Precautions Precautions: Fall Recall of Precautions/Restrictions: Intact Precaution/Restrictions Comments: post op shoe Lt Restrictions Weight Bearing Restrictions Per Provider Order: No      Mobility  Bed Mobility Overal bed mobility: Modified Independent             General bed mobility comments: extra time a little effortful no assist needed though    Transfers Overall transfer level: Needs assistance Equipment used: None Transfers: Sit to/from Stand Sit to Stand: Supervision           General transfer comment: supervision for safety, a little hesitant but stable.    Ambulation/Gait Ambulation/Gait assistance: Supervision Gait Distance (Feet): 110 Feet Assistive device: Rolling walker (2 wheels) Gait Pattern/deviations: Step-through pattern, Decreased stride length, Drifts right/left Gait velocity: dec Gait velocity interpretation: 1.31 - 2.62 ft/sec, indicative of limited community ambulator   General Gait Details: Anxious to ambulate without assistive device. Provided RW, demonstrates mild instability but able to self correct with RW. Educated on use, proximity to device to maximize support as  needed. No buckling. Denies dizziness but reports feeling unsteady.  Stairs            Wheelchair Mobility     Tilt Bed    Modified Rankin (Stroke Patients Only)       Balance Overall balance assessment: Needs  assistance Sitting-balance support: No upper extremity supported, Feet supported Sitting balance-Leahy Scale: Normal     Standing balance support: No upper extremity supported Standing balance-Leahy Scale: Good                               Pertinent Vitals/Pain Pain Assessment Pain Assessment: No/denies pain    Home Living Family/patient expects to be discharged to:: Private residence Living Arrangements: Spouse/significant other Available Help at Discharge: Family;Available 24 hours/day Type of Home: House Home Access: Ramped entrance       Home Layout: One level Home Equipment: Cane - single Librarian, academic (2 wheels)      Prior Function Prior Level of Function : Independent/Modified Independent;Driving;History of Falls (last six months)             Mobility Comments: Uses SPC, post op shoe for Lt ulcer. ADLs Comments: Ind     Extremity/Trunk Assessment   Upper Extremity Assessment Upper Extremity Assessment: Defer to OT evaluation (Appears to have Rt RTC pathology)    Lower Extremity Assessment Lower Extremity Assessment: Generalized weakness (Lt foot bandaged)       Communication   Communication Communication: No apparent difficulties    Cognition Arousal: Alert Behavior During Therapy: WFL for tasks assessed/performed   PT - Cognitive impairments: No apparent impairments                         Following commands: Intact       Cueing Cueing Techniques: Verbal cues     General Comments General comments (skin integrity, edema, etc.): See orthostatics vitals. Pt denies lightheadedness or vertigo. Feels "off balance." Symptoms not exacerbated by rolling over or supine<>sit transitions. He reports a few episodes of spontaneous diploplia while sitting at rest over the past week or 2. This symptom lasts about 5 minutes before resolving.    Exercises     Assessment/Plan    PT Assessment Patient needs continued PT  services  PT Problem List Decreased strength;Decreased range of motion;Decreased activity tolerance;Decreased balance;Decreased mobility;Decreased knowledge of use of DME;Cardiopulmonary status limiting activity       PT Treatment Interventions DME instruction;Gait training;Functional mobility training;Therapeutic activities;Therapeutic exercise;Balance training;Neuromuscular re-education;Patient/family education    PT Goals (Current goals can be found in the Care Plan section)  Acute Rehab PT Goals Patient Stated Goal: Feel better PT Goal Formulation: With patient Time For Goal Achievement: 10/06/23 Potential to Achieve Goals: Good    Frequency Min 2X/week     Co-evaluation               AM-PAC PT "6 Clicks" Mobility  Outcome Measure Help needed turning from your back to your side while in a flat bed without using bedrails?: None Help needed moving from lying on your back to sitting on the side of a flat bed without using bedrails?: None Help needed moving to and from a bed to a chair (including a wheelchair)?: A Little Help needed standing up from a chair using your arms (e.g., wheelchair or bedside chair)?: A Little Help needed to walk in hospital room?: A Little Help needed climbing 3-5 steps  with a railing? : A Little 6 Click Score: 20    End of Session Equipment Utilized During Treatment: Gait belt Activity Tolerance: Patient tolerated treatment well Patient left: in bed;with call bell/phone within reach;with bed alarm set;with family/visitor present   PT Visit Diagnosis: Unsteadiness on feet (R26.81);Other abnormalities of gait and mobility (R26.89);History of falling (Z91.81);Other symptoms and signs involving the nervous system (R29.898);Dizziness and giddiness (R42)    Time: 1223-1300 PT Time Calculation (min) (ACUTE ONLY): 37 min   Charges:   PT Evaluation $PT Eval Low Complexity: 1 Low PT Treatments $Therapeutic Activity: 8-22 mins PT General  Charges $$ ACUTE PT VISIT: 1 Visit         Jory Ng, PT, DPT [facility]  Rehabilitation Services Physical Therapist Office: [phone] Website: [email or web address]   Alinda Irani 09/22/2023, 1:15 PM

## 2023-09-22 NOTE — Progress Notes (Signed)
 PROGRESS NOTE        PATIENT DETAILS Name: [name] Fernandez Age: 83 y.o. Sex: male Date of Birth: 1940/07/20 Admit Date: 09/21/2023 Admitting Physician Bennie Brave, MD XNA:[id number], No Pcp Per  Brief Summary: Patient is a 83 y.o.  male with history of hypertension, celiac disease, HLD, chronic left ankle ulcer/chronic venous insufficiency, DM-2, GERD-s/p Nissen's fundoplication-here with 30 pound unintentional weight loss in a period of 3 months-along with poor oral intake-he became dizzy for the past several days along with a syncopal episode-hence presented to the ED for further evaluation.  Significant events: 4/11>> admit to [facility]  Significant studies: 4/11>> CT abdomen/pelvis: Nodular area of mass along the left side of the urinary bladder-concerning for neoplasm.  Nodular liver with fatty infiltration. 4/11>> CTA head/neck: No LVO or significant stenosis 4/12>> MRI brain: No acute intracranial abnormality  Significant microbiology data: None  Procedures: None  Consults: None  Subjective: Feels somewhat better today-no dizziness on turning his head-no dizziness on leaning forward or sitting up in bed.  Acknowledges that he has had very poor oral intake for the past several weeks-is using a diuretic for left leg chronic venous stasis swelling.  Objective: Vitals: Blood pressure 131/76, pulse 78, temperature 98.2 F (36.8 C), temperature source Oral, resp. rate 16, height 5\' 8"  (1.727 m), weight 80.7 kg, SpO2 96%.   Exam: Gen Exam:Alert awake-not in any distress HEENT:atraumatic, normocephalic Chest: B/L clear to auscultation anteriorly CVS:S1S2 regular Abdomen:soft non tender, non distended Extremities:no edema Neurology: Non focal Skin: no rash  Pertinent Labs/Radiology:    Latest Ref Rng & Units 09/21/2023    1:24 PM 04/10/2023    3:22 PM 01/15/2023    4:53 PM  CBC  WBC 4.0 - 10.5 K/uL 8.3  8.3  8.9   Hemoglobin 13.0 - 17.0 g/dL 02.5   42.7  06.2   Hematocrit 39.0 - 52.0 % 33.4  34.3  33.0   Platelets 150 - 400 K/uL 451  474  340     Lab Results  Component Value Date   NA 138 09/21/2023   K 3.8 09/21/2023   CL 102 09/21/2023   CO2 28 09/21/2023      Assessment/Plan: Syncope x 1 Vertigo Given history of poor oral intake-ongoing use of diuretics-suspect orthostatic mechanism Remains on IVF for now. Telemetry unremarkable Echo pending Neuroimaging negative Check orthostatic vital signs and ambulate with PT/OT and see how he does.  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  Chronic left leg ulcer/chronic venous stasis edema Holding diuretics-see above Wound care evaluation pending Follows with Dr. Julio Ohm  DM-2 (A1c 6.2 on 4/12)  diet control SSI while inpatient  GERD-s/p Nissen's fundoplication PPI  COPD Stable-no exacerbation As needed bronchodilators  Reported history of hypothyroidism Not on any meds TSH 1/11 stable Stable for outpatient follow-up with PCP  Pressure Ulcer: Agree with assessment and plan as outlined below  Pressure Injury 09/21/23 Ankle Anterior;Left Stage 3 -  Full thickness tissue loss. Subcutaneous fat may be visible but bone, tendon or muscle are NOT exposed. Chronic foot ulcer (Active)  09/21/23 2130  Location: Ankle  Location Orientation: Anterior;Left  Staging: Stage 3 -  Full thickness tissue loss. Subcutaneous fat may be visible but bone, tendon or muscle are NOT exposed.  Wound Description (Comments): Chronic foot  ulcer  Present on Admission: Yes  Dressing Type Gauze (Comment);ABD 09/21/23 2127    BMI/Obesity/Underweight: Estimated body mass index is 27.06 kg/m as calculated from the following:   Height as of this encounter: 5\' 8"  (1.727 m).   Weight as of this encounter: 80.7 kg.   Code status:   Code Status: Full Code   DVT Prophylaxis: SCDs Start: 09/22/23 0108   Family  Communication: None at bedside   Disposition Plan: Status is: Observation The patient will require care spanning > 2 midnights and should be moved to inpatient because: Severity of illness   Planned Discharge Destination:Home   Diet: Diet Order             Diet NPO time specified  Diet effective now                     Antimicrobial agents: Anti-infectives (From admission, onward)    None        MEDICATIONS: Scheduled Meds:  insulin aspart  0-9 Units Subcutaneous Q4H   Continuous Infusions:  sodium chloride 100 mL/hr at 09/22/23 0311   PRN Meds:.acetaminophen **OR** acetaminophen   I have personally reviewed following labs and imaging studies  LABORATORY DATA: CBC: Recent Labs  Lab 09/21/23 1324  WBC 8.3  HGB 11.1*  HCT 33.4*  MCV 87.0  PLT 451*    Basic Metabolic Panel: Recent Labs  Lab 09/21/23 1324  NA 138  K 3.8  CL 102  CO2 28  GLUCOSE 108*  BUN 23  CREATININE 1.10  CALCIUM 9.0    GFR: Estimated Creatinine Clearance: 50.1 mL/min (by C-G formula based on SCr of 1.1 mg/dL).  Liver Function Tests: Recent Labs  Lab 09/21/23 1324  AST 28  ALT 22  ALKPHOS 85  BILITOT 0.7  PROT 7.5  ALBUMIN 3.9   Recent Labs  Lab 09/21/23 1324  LIPASE 16   No results for input(s): "AMMONIA" in the last 168 hours.  Coagulation Profile: No results for input(s): "INR", "PROTIME" in the last 168 hours.  Cardiac Enzymes: No results for input(s): "CKTOTAL", "CKMB", "CKMBINDEX", "TROPONINI" in the last 168 hours.  BNP (last 3 results) No results for input(s): "PROBNP" in the last 8760 hours.  Lipid Profile: No results for input(s): "CHOL", "HDL", "LDLCALC", "TRIG", "CHOLHDL", "LDLDIRECT" in the last 72 hours.  Thyroid Function Tests: Recent Labs    09/21/23 1607  TSH 2.100    Anemia Panel: No results for input(s): "VITAMINB12", "FOLATE", "FERRITIN", "TIBC", "IRON", "RETICCTPCT" in the last 72 hours.  Urine analysis:     Component Value Date/Time   COLORURINE YELLOW 09/21/2023 1324   APPEARANCEUR CLEAR 09/21/2023 1324   LABSPEC 1.009 09/21/2023 1324   PHURINE 5.5 09/21/2023 1324   GLUCOSEU NEGATIVE 09/21/2023 1324   HGBUR NEGATIVE 09/21/2023 1324   BILIRUBINUR NEGATIVE 09/21/2023 1324   KETONESUR NEGATIVE 09/21/2023 1324   PROTEINUR NEGATIVE 09/21/2023 1324   UROBILINOGEN 0.2 05/20/2014 1108   NITRITE NEGATIVE 09/21/2023 1324   LEUKOCYTESUR NEGATIVE 09/21/2023 1324    Sepsis Labs: Lactic Acid, Venous    Component Value Date/Time   LATICACIDVEN 1.7 06/15/2022 1650    MICROBIOLOGY: No results found for this or any previous visit (from the past 240 hours).  RADIOLOGY STUDIES/RESULTS: MR BRAIN WO CONTRAST Result Date: 09/22/2023 CLINICAL DATA:  Initial evaluation for acute syncope/presyncope. Stroke suspected. EXAM: MRI HEAD WITHOUT CONTRAST TECHNIQUE: Multiplanar, multiecho pulse sequences of the brain and surrounding structures were obtained without intravenous contrast. COMPARISON:  CT from  09/21/2023. FINDINGS: Brain: Cerebral volume within normal limits. Patchy T2/FLAIR hyperintensity involving the periventricular and deep white matter both cerebral hemispheres, most characteristic of chronic microvascular ischemic disease, moderate in nature. No evidence for acute or subacute infarct. Gray-white matter differentiation maintained. No areas of chronic cortical infarction. No acute or chronic intracranial blood products. No mass lesion, midline shift or mass effect. No hydrocephalus or extra-axial fluid collection. Pituitary gland within normal limits. Vascular: Major intracranial vascular flow voids are maintained. Skull and upper cervical spine: Craniocervical junction within normal limits. Bone marrow signal intensity normal. No scalp soft tissue abnormality. Sinuses/Orbits: Prior bilateral ocular lens replacement. Paranasal sinuses are largely clear. No mastoid effusion. Other: None. IMPRESSION: 1. No  acute intracranial abnormality. 2. 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 Electronically Signed   By: Virgia Griffins M.D.   On: 09/22/2023 03:31   DG Chest Port 1 View Result Date: 09/21/2023 CLINICAL DATA:  Chest pain EXAM: PORTABLE CHEST 1 VIEW COMPARISON:  X-ray 08/22/2019 FINDINGS: Numerous surgical clips along the lower mediastinum and left upper abdomen. Underinflation with some interstitial changes, possibly chronic. Normal cardiopericardial silhouette. No pneumothorax, effusion or edema. No consolidation. Overlapping cardiac leads. Osteopenia and degenerative changes. Elevated right humeral head. Please correlate for a evidence of a rotator cuff tear. IMPRESSION: Underinflation with chronic changes.  Surgical clips. Electronically Signed   By: Adrianna Horde M.D.   On: 09/21/2023 18:03   CT ABDOMEN PELVIS W CONTRAST Result Date: 09/21/2023 CLINICAL DATA:  Unintended weight loss.  Neurologic deficit. EXAM: CT ABDOMEN AND PELVIS WITH CONTRAST TECHNIQUE: Multidetector CT imaging of the abdomen and pelvis was performed using the standard protocol following bolus administration of intravenous contrast. RADIATION DOSE REDUCTION: This exam was performed according to the departmental dose-optimization program which includes automated exposure control, adjustment of the mA and/or kV according to patient size and/or use of iterative reconstruction technique. CONTRAST:  100mL OMNIPAQUE IOHEXOL 350 MG/ML SOLN COMPARISON:  Prior chest and spine imaging. FINDINGS: Lower chest: There are areas of interstitial septal thickening along the lung bases. Possible areas of scarring and fibrotic change. No significant pleural effusion. Coronary artery calcifications are noted. Focal hiatal hernia. Adjacent surgical clips along the GE junction. There is some air along the margin of the hernia and lower esophagus along the wall. By history in epic the patient has a history of hiatal hernia repair. On  coronal imaging the air is to the left of the lower esophagus and could be within gastric folds. There is some thickening of the walls of the distal esophagus and upper stomach. Please correlate for any symptoms and further workup when appropriate such as upper GI/esophagram versus endoscopy Hepatobiliary: Slightly nodular contour to the liver. No space-occupying liver lesion. Patent portal vein. Stones in the gallbladder. Pancreas: Severe fatty atrophy of the pancreas. Spleen: Surgical clips seen in the area of the left upper quadrant with a small area of splenic tissue. Please correlate with previous intervention. Adrenals/Urinary Tract: Adrenal glands are preserved. Mild bilateral renal atrophy. No enhancing renal mass. No collecting system dilatation. The ureters have normal course and caliber down to the bladder. 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 At the diverticulum is a nodular masslike soft tissue focus. On coronal series 4, image 43 this measures 17 mm. An intrinsic bladder neoplasm is possible. There is extension of this nodular tissue into the diverticulum itself on axial series 2, image 81. Overall in the axial plane this measures 19 mm. Stomach/Bowel: On this non oral contrast  exam large bowel is nondilated. There are scattered areas of stool particularly in the sigmoid and rectum. Areas along the ascending colon as well. There is a redundant course of the sigmoid colon into the right mid abdomen. There also some portions of the colon such as the descending colon which are collapsed. Stomach is relatively collapsed. Slight fold and wall thickening suggested. Please correlate with symptoms. Small bowel is nondilated. Vascular/Lymphatic: Aortic atherosclerosis. No enlarged abdominal or pelvic lymph nodes. Reproductive: Prostate is unremarkable. Other: No free air or free fluid. There is a midline anterior abdominal wall fat containing hernia with some soft tissue thickening along the hernia sac.  Musculoskeletal: Multilevel compression deformities along the thoracic spine with augmentation cement. There is also some compression deformities at L2 and L3. Please correlate with history. Critical Value/emergent results were called by telephone at the time of interpretation on 09/21/2023 at 5:59 pm to provider [facility] , who verbally acknowledged these results. IMPRESSION: Nodular area of mass along left side of the urinary bladder along the opening of the left lateral bladder diverticulum. A bladder neoplasm is possible. Recommend further evaluation. Nodular liver with fatty infiltration.  Gallstones. Surgical changes is seen at the diaphragm and left upper quadrant. Patient's history of a fundal location. There is wall thickening along the lower esophagus and upper stomach with some atypical distribution of air to the left of the lower thoracic esophagus. Based on appearance overall and patient's history this could be air within fold along a fundoplication. Please correlate however with any particular esophageal or gastric symptoms and recommend further workup when appropriate. Electronically Signed   By: Adrianna Horde M.D.   On: 09/21/2023 18:02   CT ANGIO HEAD NECK W WO CM Result Date: 09/21/2023 CLINICAL DATA:  Neuro deficit, acute, stroke suspected. EXAM: CT ANGIOGRAPHY HEAD AND NECK WITH AND WITHOUT CONTRAST TECHNIQUE: Multidetector CT imaging of the head and neck was performed using the standard protocol during bolus administration of intravenous contrast. Multiplanar CT image reconstructions and MIPs were obtained to evaluate the vascular anatomy. Carotid stenosis measurements (when applicable) are obtained utilizing NASCET criteria, using the distal internal carotid diameter as the denominator. RADIATION DOSE REDUCTION: This exam was performed according to the departmental dose-optimization program which includes automated exposure control, adjustment of the mA and/or kV according to patient  size and/or use of iterative reconstruction technique. CONTRAST:  100mL OMNIPAQUE IOHEXOL 350 MG/ML SOLN COMPARISON:  None Available. FINDINGS: CT HEAD FINDINGS Brain: No acute hemorrhage. Cortical gray-white differentiation is preserved. 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 Prominence of the ventricles and sulci within normal limits for age. No extra-axial collection. Basilar cisterns are patent. Vascular: No hyperdense vessel or unexpected calcification. Skull: No calvarial fracture or suspicious bone lesion. Skull base is unremarkable. Sinuses/Orbits: No acute finding. Other: None. Review of the MIP images confirms the above findings CTA NECK FINDINGS Aortic arch: Three-vessel arch configuration. Atherosclerotic calcifications of the aortic arch and arch vessel origins. Arch vessel origins are patent. Right carotid system: No evidence of dissection, stenosis (50% or greater), or occlusion. Mild calcified plaque along the mid right CCA and carotid bulb. Left carotid system: No evidence of dissection, stenosis (50% or greater), or occlusion. Mild calcified plaque along the left carotid bulb and proximal left cervical ICA. Tortuous distal left cervical ICA. Vertebral arteries: Codominant. No evidence of dissection, stenosis (50% or greater), or occlusion. Skeleton: Mild cervical spondylosis without high-grade spinal canal stenosis. No suspicious bone  lesions. Other neck: Unremarkable. Upper chest: Emphysema in the lung apices. Review of the MIP images confirms the above findings CTA HEAD FINDINGS Anterior circulation: Intracranial ICAs are patent without stenosis or aneurysm. The proximal ACAs and MCAs are patent without stenosis or aneurysm. Distal branches are symmetric. Posterior circulation: Normal basilar artery. The SCAs, AICAs and PICAs are patent proximally. The PCAs are patent proximally without stenosis or aneurysm. Distal branches are  symmetric. Venous sinuses: As permitted by contrast timing, patent. Anatomic variants: Persistent fetal origin of the bilateral PCAs with hypoplastic P1 segments. Review of the MIP images confirms the above findings IMPRESSION: 1. No acute intracranial abnormality. 2. No large vessel occlusion, hemodynamically significant stenosis, or aneurysm in the head or neck. Aortic Atherosclerosis (ICD10-I70.0) and Emphysema (ICD10-J43.9). Electronically Signed   By: Audra Blend M.D.   On: 09/21/2023 17:04     LOS: 0 days   Kimberly Penna, MD  [facility]    To contact the attending provider between 7A-7P or the covering provider during after hours 7P-7A, please log into the web site [email or web address] and access using universal [place] password for that web site. If you do not have the password, please call the hospital operator.  09/22/2023, 10:06 AM

## 2023-09-22 NOTE — Care Management Obs Status (Signed)
 MEDICARE OBSERVATION STATUS NOTIFICATION   Patient Details  Name: [name] Fernandez MRN: [id number] Date of Birth: 10-Sep-1940   Medicare Observation Status Notification Given:  Yes    Omie Bickers, RN 09/22/2023, 9:36 AM

## 2023-09-23 ENCOUNTER — Observation Stay (HOSPITAL_COMMUNITY)

## 2023-09-23 DIAGNOSIS — I10 Essential (primary) hypertension: Secondary | ICD-10-CM | POA: Diagnosis not present

## 2023-09-23 DIAGNOSIS — R42 Dizziness and giddiness: Secondary | ICD-10-CM | POA: Diagnosis not present

## 2023-09-23 DIAGNOSIS — R55 Syncope and collapse: Secondary | ICD-10-CM | POA: Diagnosis not present

## 2023-09-23 DIAGNOSIS — N3289 Other specified disorders of bladder: Secondary | ICD-10-CM | POA: Diagnosis not present

## 2023-09-23 DIAGNOSIS — K219 Gastro-esophageal reflux disease without esophagitis: Secondary | ICD-10-CM | POA: Diagnosis not present

## 2023-09-23 LAB — GLUCOSE, CAPILLARY
Glucose-Capillary: 92 mg/dL (ref 70–99)
Glucose-Capillary: 98 mg/dL (ref 70–99)
Glucose-Capillary: 133 mg/dL — ABNORMAL HIGH (ref 70–99)

## 2023-09-23 LAB — ECHOCARDIOGRAM COMPLETE
Area-P 1/2: 3.77 cm2
Height: 68 in
S' Lateral: 3 cm
Weight: 2848 [oz_av]

## 2023-09-23 MED ORDER — TORSEMIDE 20 MG PO TABS: 40.0000 mg | ORAL_TABLET | Freq: Every day | ORAL | Status: AC | PRN

## 2023-09-23 NOTE — Discharge Summary (Signed)
 PATIENT DETAILS Name: [name] Fernandez Age: 83 y.o. Sex: male Date of Birth: 1940/12/16 MRN: [id number]. Admitting Physician: Bennie Brave, MD WUJ:[id number], No Pcp Per  Admit Date: 09/21/2023 Discharge date: 09/23/2023  Recommendations for Outpatient Follow-up:  Follow up with PCP in 1-2 weeks Please obtain CMP/CBC in one week Please ensure follow-up with urology  Admitted From:  Home  Disposition: Home   Discharge Condition: good  CODE STATUS:   Code Status: Full Code   Diet recommendation:  Diet Order             Diet - low sodium heart healthy           Diet Carb Modified           Diet regular Fluid consistency: Thin  Diet effective now                    Brief Summary: Patient is a 83 y.o.  male with history of hypertension, celiac disease, HLD, chronic left ankle ulcer/chronic venous insufficiency, DM-2, GERD-s/p Nissen's fundoplication-here with 30 pound unintentional weight loss in a period of 3 months-along with poor oral intake-he became dizzy for the past several days along with a syncopal episode-hence presented to the ED for further evaluation.   Significant events: 4/11>> admit to [facility]   Significant studies: 4/11>> CT abdomen/pelvis: Nodular area of mass along the left side of the urinary bladder-concerning for neoplasm.  Nodular liver with fatty infiltration. 4/11>> CTA head/neck: No LVO or significant stenosis 4/12>> MRI brain: No acute intracranial abnormality 4/13>>TTE:EF 60-65%   Significant microbiology data: None   Procedures: None   Consults: None  Brief Hospital Course: Syncope x 1 Vertigo Given history of poor oral intake-ongoing use of diuretics-suspect orthostatic mechanism-orthostatic vital signs were borderline on 4/12 but this was after IV fluid hydration. Telemetry unremarkable Echo with stable EF He feels much better-no further vertigo by the day of discharge-ambulating in the room without any major issues. Stable for  discharge.  Instructed to take diuretics as needed for weight gain or worsening edema.   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 Aimee Alf on 4/12-his office will arrange for outpatient follow-up.   Chronic left leg ulcer/chronic venous stasis edema Change diuretics to as needed. Wound care evaluation pending Follows with Dr. Julio Ohm   DM-2 (A1c 6.2 on 4/12)  diet control as an outpatient. SSI while inpatient   GERD-s/p Nissen's fundoplication PPI   COPD Stable-no exacerbation As needed bronchodilators   Reported history of hypothyroidism Not on any meds TSH 1/11 stable Stable for outpatient follow-up with PCP   Pressure Ulcer: Agree with assessment and plan as outlined below Pressure Injury 09/21/23 Ankle Anterior;Left Stage 3 -  Full thickness tissue loss. Subcutaneous fat may be visible but bone, tendon or muscle are NOT exposed. Chronic foot ulcer (Active)  09/21/23 2130  Location: Ankle  Location Orientation: Anterior;Left  Staging: Stage 3 -  Full thickness tissue loss. Subcutaneous fat may be visible but bone, tendon or muscle are NOT exposed.  Wound Description (Comments): Chronic foot ulcer  Present on Admission: Yes  Dressing Type Gauze (Comment);ABD 09/22/23 2000    Discharge Diagnoses:  Principal Problem:   Vertigo Active Problems:   Essential hypertension   GERD (gastroesophageal reflux disease)   Hyperlipidemia   Syncope   Unintentional weight loss   Bladder mass   Discharge Instructions:  Activity:  As tolerated    Discharge Instructions     Call  MD for:  difficulty breathing, headache or visual disturbances   Complete by: As directed    Diet - low sodium heart healthy   Complete by: As directed    Diet Carb Modified   Complete by: As directed    Discharge instructions   Complete by: As directed    Follow with Primary MD   in 1-2 weeks  Follow-up with [facility]-the office will call you  with a follow-up appointment-if you not hear from them-please give them a call.  Change your diuretic/fluid pill dosing to as needed for weight gain more than 2 pounds or worsening leg swelling.  Please get a complete blood count and chemistry panel checked by your Primary MD at your next visit, and again as instructed by your Primary MD.  Get Medicines reviewed and adjusted: Please take all your medications with you for your next visit with your Primary MD  Laboratory/radiological data: Please request your Primary MD to go over all hospital tests and procedure/radiological results at the follow up, please ask your Primary MD to get all Hospital records sent to his/her office.  In some cases, they will be blood work, cultures and biopsy results pending at the time of your discharge. Please request that your primary care M.D. follows up on these results.  Also Note the following: If you experience worsening of your admission symptoms, develop shortness of breath, life threatening emergency, suicidal or homicidal thoughts you must seek medical attention immediately by calling 911 or calling your MD immediately  if symptoms less severe.  You must read complete instructions/literature along with all the possible adverse reactions/side effects for all the Medicines you take and that have been prescribed to you. Take any new Medicines after you have completely understood and accpet all the possible adverse reactions/side effects.   Do not drive when taking Pain medications or sleeping medications (Benzodaizepines)  Do not take more than prescribed Pain, Sleep and Anxiety Medications. It is not advisable to combine anxiety,sleep and pain medications without talking with your primary care practitioner  Special Instructions: If you have smoked or chewed Tobacco  in the last 2 yrs please stop smoking, stop any regular Alcohol  and or any Recreational drug use.  Wear Seat belts while driving.  Please  note: You were cared for by a hospitalist during your hospital stay. Once you are discharged, your primary care physician will handle any further medical issues. Please note that NO REFILLS for any discharge medications will be authorized once you are discharged, as it is imperative that you return to your primary care physician (or establish a relationship with a primary care physician if you do not have one) for your post hospital discharge needs so that they can reassess your need for medications and monitor your lab values.   Non-Pharmacologic Reccomendations for Orthostatic Hypotension 1) Lifestyle modification. These measures include:             - Arising slowly, in stages, from supine to seated to standing. This maneuver is most important in the morning, when orthostatic tolerance is lowest.             - Avoiding straining, coughing, and walking in hot weather.These activities reduce venous return and worsen orthostatic hypotension.             - Maintaining hydration and avoiding over-heating.             - Raising the head of the bed 30 to 45 degrees decreases  renal perfusion, thereby activating the renin-angiotensin-aldosterone system and decreasing nocturnal diuresis, which can be pronounced in these patients.  These changes relieve orthostatic hypotension by expanding extracellular fluid volume and may reduce end organ damage by reducing supine HTN.             2) Exercise - walking 30 minutes a day, exercise in a swimming pool, exercise in a recumbent or seated position (using a stationary bike or rowing machine)             3) Abdominal binders              4) Compression Stockings             5) Increased salt and water intake to 2 L to 2.5 L of water a day             6) Modification of meals:             - Avoiding large meals             - Ingesting meals low in carbohydrates             - Alcohol should be avoided during the day as it is a vasodilator             - Drink water  with meals             - Avoiding activities or sudden standing immediately after eating             7) Physical Countermaneuvers during daily activities: leg crossing, standing on tip toes, squatting   Discharge wound care:   Complete by: As directed    Continue prior wound care as previous.   Increase activity slowly   Complete by: As directed       Allergies as of 09/23/2023       Reactions   Feraheme [ferumoxytol] Swelling, Other (See Comments)   Swelling of lips and tongue, could not talk 30 minutes after the infusion.   Latex Other (See Comments)   Latex butterfly bandages tore off the skin and caused terrible blisters   Sulfa Antibiotics Hives   Severe itching, blisters   Wound Dressing Adhesive Other (See Comments)   Latex butterfly bandages tore off the skin and caused terrible blisters   Levothyroxine Swelling, Other (See Comments)   Lip swelling   Doxycycline Nausea Only        Medication List     TAKE these medications    betamethasone dipropionate 0.05 % cream Apply 1 Application topically 2 (two) times daily.   betamethasone valerate ointment 0.1 % Commonly known as: VALISONE Apply 1 Application topically 2 (two) times daily. What changed: when to take this   doxepin 10 MG capsule Commonly known as: SINEQUAN Take 10-20 mg by mouth at bedtime as needed (itching).   hydrOXYzine 25 MG tablet Commonly known as: ATARAX TAKE 1 TABLET 3 x / day  AS NEEDED FOR ANXIETY OR SLEEP                                                             /  TAKE                                         BY                                                 MOUTH   levocetirizine 5 MG tablet Commonly known as: XYZAL Take 5 mg by mouth at bedtime.   NON FORMULARY Take 2 capsules by mouth every morning. Thyroid Complete   omeprazole 20 MG tablet Commonly known as: PRILOSEC OTC Take 20 mg by mouth daily before  breakfast.   predniSONE 10 MG tablet Commonly known as: DELTASONE Take 10 mg by mouth every morning.   torsemide 20 MG tablet Commonly known as: DEMADEX Take 2 tablets (40 mg total) by mouth daily as needed (For worsening swelling or more than 2 pound weight gain from your baseline.). Take 2 tablet  Daily for Fluid Retention / Leg Swelling What changed:  how much to take how to take this when to take this reasons to take this   triamcinolone cream 0.1 % Commonly known as: KENALOG APPLY TO THE LEFT LEG UNDER THE WRAP DAILY WHERE THERE IS ITCHING OR RASH X 2 WEEKS OR UNTIL HEALED   vitamin C 1000 MG tablet Take 1,000 mg by mouth daily.   Vitamin D3 125 MCG (5000 UT) Tabs Take 10,000 Units by mouth daily.   zinc gluconate 50 MG tablet Take 50 mg by mouth daily.               Discharge Care Instructions  (From admission, onward)           Start     Ordered   09/23/23 0000  Discharge wound care:       Comments: Continue prior wound care as previous.   09/23/23 1122            Follow-up Information     [facility]. Call.   Why: Call to find a [facility] provider in your area who is accepting your insurance Contact information: [phone]        [facility] Follow up.   Specialty: Rehabilitation Why: They will call to schedule physical therapy Contact information: [address] [place]  [address] [phone]        Thelbert Finner, MD Follow up.   Specialty: Urology Why: Hospital follow up, Office will call with date/time, If you dont hear from them,please give them a call Contact information: [address] [place] [address] [phone]                Allergies  Allergen Reactions   Feraheme [Ferumoxytol] Swelling and Other (See Comments)    Swelling of lips and tongue, could not talk 30 minutes after the infusion.   Latex  Other (See Comments)    Latex butterfly bandages tore off the skin and caused terrible blisters   Sulfa Antibiotics Hives    Severe itching, blisters   Wound Dressing Adhesive Other (See Comments)    Latex butterfly bandages tore off the skin and caused terrible blisters   Levothyroxine Swelling and Other (See Comments)    Lip swelling   Doxycycline Nausea Only  Other Procedures/Studies: ECHOCARDIOGRAM COMPLETE Result Date: 09/23/2023    ECHOCARDIOGRAM REPORT   Patient Name:   [name] Fernandez Date of Exam: 09/23/2023 Medical Rec #:  [id number]      Height:       68.0 in Accession #:    [id number]     Weight:       178.0 lb Date of Birth:  [birth year]      BSA:          1.945 m Patient Age:    82 years       BP:           139/85 mmHg Patient Gender: M              HR:           74 bpm. Exam Location:  Inpatient Procedure: 2D Echo, Cardiac Doppler and Color Doppler (Both Spectral and Color            Flow Doppler were utilized during procedure). Indications:    Syncope R55  History:        Patient has no prior history of Echocardiogram examinations.                 COPD, Signs/Symptoms:Syncope; Risk Factors:Hypertension,                 Diabetes and Dyslipidemia.  Sonographer:    Denese Finn RCS Referring Phys: [id number] Heman [facility] IMPRESSIONS  1. Left ventricular ejection fraction, by estimation, is 60 to 65%. The left ventricle has normal function. The left ventricle has no regional wall motion abnormalities. Left ventricular diastolic parameters are consistent with Grade I diastolic dysfunction (impaired relaxation).  2. Right ventricular systolic function is normal. The right ventricular size is normal.  3. Left atrial size was mildly dilated.  4. The mitral valve is normal in structure. Mild mitral valve regurgitation. No evidence of mitral stenosis.  5. The aortic valve is tricuspid. Aortic valve regurgitation is not visualized. No aortic stenosis is present.  6. Aortic dilatation noted. There  is borderline dilatation of the aortic arch, measuring 31 mm.  7. The inferior vena cava is normal in size with greater than 50% respiratory variability, suggesting right atrial pressure of 3 mmHg. Comparison(s): No prior Echocardiogram. FINDINGS  Left Ventricle: Left ventricular ejection fraction, by estimation, is 60 to 65%. The left ventricle has normal function. The left ventricle has no regional wall motion abnormalities. Strain was performed and the global longitudinal strain is indeterminate. The left ventricular internal cavity size was normal in size. There is no left ventricular hypertrophy. Left ventricular diastolic parameters are consistent with Grade I diastolic dysfunction (impaired relaxation). Right Ventricle: The right ventricular size is normal. No increase in right ventricular wall thickness. Right ventricular systolic function is normal. Left Atrium: Left atrial size was mildly dilated. Right Atrium: Right atrial size was normal in size. Pericardium: There is no evidence of pericardial effusion. Mitral Valve: The mitral valve is normal in structure. Mild mitral valve regurgitation. No evidence of mitral valve stenosis. Tricuspid Valve: The tricuspid valve is normal in structure. Tricuspid valve regurgitation is not demonstrated. No evidence of tricuspid stenosis. Aortic Valve: The aortic valve is tricuspid. Aortic valve regurgitation is not visualized. No aortic stenosis is present. Pulmonic Valve: The pulmonic valve was not well visualized. Pulmonic valve regurgitation is not visualized. Aorta: Aortic dilatation noted. There is borderline dilatation of the aortic arch, measuring 31 mm. Venous: The inferior vena cava is normal  in size with greater than 50% respiratory variability, suggesting right atrial pressure of 3 mmHg. IAS/Shunts: The atrial septum is grossly normal. Additional Comments: 3D was performed not requiring image post processing on an independent workstation and was  indeterminate.  LEFT VENTRICLE PLAX 2D LVIDd:         4.70 cm   Diastology LVIDs:         3.00 cm   LV e' medial:    6.20 cm/s LV PW:         1.00 cm   LV E/e' medial:  12.9 LV IVS:        0.90 cm   LV e' lateral:   10.70 cm/s LVOT diam:     1.90 cm   LV E/e' lateral: 7.5 LV SV:         57 LV SV Index:   29 LVOT Area:     2.84 cm  RIGHT VENTRICLE RV S prime:     16.50 cm/s TAPSE (M-mode): 2.6 cm LEFT ATRIUM             Index        RIGHT ATRIUM           Index LA diam:        2.60 cm 1.34 cm/m   RA Area:     16.10 cm LA Vol (A2C):   44.6 ml 22.93 ml/m  RA Volume:   33.45 ml  17.20 ml/m LA Vol (A4C):   70.2 ml 36.09 ml/m LA Biplane Vol: 54.6 ml 28.07 ml/m  AORTIC VALVE LVOT Vmax:   73.40 cm/s LVOT Vmean:  54.800 cm/s LVOT VTI:    0.200 m  AORTA Ao Root diam: 3.60 cm MITRAL VALVE MV Area (PHT): 3.77 cm    SHUNTS MV Decel Time: 201 msec    Systemic VTI:  0.20 m MV E velocity: 80.10 cm/s  Systemic Diam: 1.90 cm MV A velocity: 82.00 cm/s MV E/A ratio:  0.98 Gloriann Larger MD Electronically signed by Gloriann Larger MD Signature Date/Time: 09/23/2023/2:12:54 PM    Final    MR BRAIN WO CONTRAST Result Date: 09/22/2023 CLINICAL DATA:  Initial evaluation for acute syncope/presyncope. Stroke suspected. EXAM: MRI HEAD WITHOUT CONTRAST TECHNIQUE: Multiplanar, multiecho pulse sequences of the brain and surrounding structures were obtained without intravenous contrast. COMPARISON:  CT from 09/21/2023. FINDINGS: Brain: Cerebral volume within normal limits. Patchy T2/FLAIR hyperintensity involving the periventricular and deep white matter both cerebral hemispheres, most characteristic of chronic microvascular ischemic disease, moderate in nature. No evidence for acute or subacute infarct. Gray-white matter differentiation maintained. No areas of chronic cortical infarction. No acute or chronic intracranial blood products. No mass lesion, midline shift or mass effect. No hydrocephalus or extra-axial fluid  collection. Pituitary gland within normal limits. Vascular: Major intracranial vascular flow voids are maintained. Skull and upper cervical spine: Craniocervical junction within normal limits. Bone marrow signal intensity normal. No scalp soft tissue abnormality. Sinuses/Orbits: Prior bilateral ocular lens replacement. Paranasal sinuses are largely clear. No mastoid effusion. Other: None. IMPRESSION: 1. No acute intracranial abnormality. 2. 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 Electronically Signed   By: Virgia Griffins M.D.   On: 09/22/2023 03:31   DG Chest Port 1 View Result Date: 09/21/2023 CLINICAL DATA:  Chest pain EXAM: PORTABLE CHEST 1 VIEW COMPARISON:  X-ray 08/22/2019 FINDINGS: Numerous surgical clips along the lower mediastinum and left upper abdomen. Underinflation with some interstitial changes, possibly chronic. Normal cardiopericardial silhouette. No pneumothorax, effusion or edema. No  consolidation. Overlapping cardiac leads. Osteopenia and degenerative changes. Elevated right humeral head. Please correlate for a evidence of a rotator cuff tear. IMPRESSION: Underinflation with chronic changes.  Surgical clips. Electronically Signed   By: Adrianna Horde M.D.   On: 09/21/2023 18:03   CT ABDOMEN PELVIS W CONTRAST Result Date: 09/21/2023 CLINICAL DATA:  Unintended weight loss.  Neurologic deficit. EXAM: CT ABDOMEN AND PELVIS WITH CONTRAST TECHNIQUE: Multidetector CT imaging of the abdomen and pelvis was performed using the standard protocol following bolus administration of intravenous contrast. RADIATION DOSE REDUCTION: This exam was performed according to the departmental dose-optimization program which includes automated exposure control, adjustment of the mA and/or kV according to patient size and/or use of iterative reconstruction technique. CONTRAST:  100mL OMNIPAQUE IOHEXOL 350 MG/ML SOLN COMPARISON:  Prior chest and spine imaging. FINDINGS: Lower chest: There are areas  of interstitial septal thickening along the lung bases. Possible areas of scarring and fibrotic change. No significant pleural effusion. Coronary artery calcifications are noted. Focal hiatal hernia. Adjacent surgical clips along the GE junction. There is some air along the margin of the hernia and lower esophagus along the wall. By history in epic the patient has a history of hiatal hernia repair. On coronal imaging the air is to the left of the lower esophagus and could be within gastric folds. There is some thickening of the walls of the distal esophagus and upper stomach. Please correlate for any symptoms and further workup when appropriate such as upper GI/esophagram versus endoscopy Hepatobiliary: Slightly nodular contour to the liver. No space-occupying liver lesion. Patent portal vein. Stones in the gallbladder. Pancreas: Severe fatty atrophy of the pancreas. Spleen: Surgical clips seen in the area of the left upper quadrant with a small area of splenic tissue. Please correlate with previous intervention. Adrenals/Urinary Tract: Adrenal glands are preserved. Mild bilateral renal atrophy. No enhancing renal mass. No collecting system dilatation. The ureters have normal course and caliber down to the bladder. 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 At the diverticulum is a nodular masslike soft tissue focus. On coronal series 4, image 43 this measures 17 mm. An intrinsic bladder neoplasm is possible. There is extension of this nodular tissue into the diverticulum itself on axial series 2, image 81. Overall in the axial plane this measures 19 mm. Stomach/Bowel: On this non oral contrast exam large bowel is nondilated. There are scattered areas of stool particularly in the sigmoid and rectum. Areas along the ascending colon as well. There is a redundant course of the sigmoid colon into the right mid abdomen. There also some portions of the colon such as the descending colon which are collapsed. Stomach is  relatively collapsed. Slight fold and wall thickening suggested. Please correlate with symptoms. Small bowel is nondilated. Vascular/Lymphatic: Aortic atherosclerosis. No enlarged abdominal or pelvic lymph nodes. Reproductive: Prostate is unremarkable. Other: No free air or free fluid. There is a midline anterior abdominal wall fat containing hernia with some soft tissue thickening along the hernia sac. Musculoskeletal: Multilevel compression deformities along the thoracic spine with augmentation cement. There is also some compression deformities at L2 and L3. Please correlate with history. Critical Value/emergent results were called by telephone at the time of interpretation on 09/21/2023 at 5:59 pm to provider [facility] , who verbally acknowledged these results. IMPRESSION: Nodular area of mass along left side of the urinary bladder along the opening of the left lateral bladder diverticulum. A bladder neoplasm is possible. Recommend further evaluation. Nodular liver with fatty infiltration.  Gallstones. Surgical changes is seen at the diaphragm and left upper quadrant. Patient's history of a fundal location. There is wall thickening along the lower esophagus and upper stomach with some atypical distribution of air to the left of the lower thoracic esophagus. Based on appearance overall and patient's history this could be air within fold along a fundoplication. Please correlate however with any particular esophageal or gastric symptoms and recommend further workup when appropriate. Electronically Signed   By: Adrianna Horde M.D.   On: 09/21/2023 18:02   CT ANGIO HEAD NECK W WO CM Result Date: 09/21/2023 CLINICAL DATA:  Neuro deficit, acute, stroke suspected. EXAM: CT ANGIOGRAPHY HEAD AND NECK WITH AND WITHOUT CONTRAST TECHNIQUE: Multidetector CT imaging of the head and neck was performed using the standard protocol during bolus administration of intravenous contrast. Multiplanar CT image reconstructions and  MIPs were obtained to evaluate the vascular anatomy. Carotid stenosis measurements (when applicable) are obtained utilizing NASCET criteria, using the distal internal carotid diameter as the denominator. RADIATION DOSE REDUCTION: This exam was performed according to the departmental dose-optimization program which includes automated exposure control, adjustment of the mA and/or kV according to patient size and/or use of iterative reconstruction technique. CONTRAST:  100mL OMNIPAQUE IOHEXOL 350 MG/ML SOLN COMPARISON:  None Available. FINDINGS: CT HEAD FINDINGS Brain: No acute hemorrhage. Cortical gray-white differentiation is preserved. 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 Prominence of the ventricles and sulci within normal limits for age. No extra-axial collection. Basilar cisterns are patent. Vascular: No hyperdense vessel or unexpected calcification. Skull: No calvarial fracture or suspicious bone lesion. Skull base is unremarkable. Sinuses/Orbits: No acute finding. Other: None. Review of the MIP images confirms the above findings CTA NECK FINDINGS Aortic arch: Three-vessel arch configuration. Atherosclerotic calcifications of the aortic arch and arch vessel origins. Arch vessel origins are patent. Right carotid system: No evidence of dissection, stenosis (50% or greater), or occlusion. Mild calcified plaque along the mid right CCA and carotid bulb. Left carotid system: No evidence of dissection, stenosis (50% or greater), or occlusion. Mild calcified plaque along the left carotid bulb and proximal left cervical ICA. Tortuous distal left cervical ICA. Vertebral arteries: Codominant. No evidence of dissection, stenosis (50% or greater), or occlusion. Skeleton: Mild cervical spondylosis without high-grade spinal canal stenosis. No suspicious bone lesions. Other neck: Unremarkable. Upper chest: Emphysema in the lung apices. Review of the MIP  images confirms the above findings CTA HEAD FINDINGS Anterior circulation: Intracranial ICAs are patent without stenosis or aneurysm. The proximal ACAs and MCAs are patent without stenosis or aneurysm. Distal branches are symmetric. Posterior circulation: Normal basilar artery. The SCAs, AICAs and PICAs are patent proximally. The PCAs are patent proximally without stenosis or aneurysm. Distal branches are symmetric. Venous sinuses: As permitted by contrast timing, patent. Anatomic variants: Persistent fetal origin of the bilateral PCAs with hypoplastic P1 segments. Review of the MIP images confirms the above findings IMPRESSION: 1. No acute intracranial abnormality. 2. No large vessel occlusion, hemodynamically significant stenosis, or aneurysm in the head or neck. Aortic Atherosclerosis (ICD10-I70.0) and Emphysema (ICD10-J43.9). Electronically Signed   By: Audra Blend M.D.   On: 09/21/2023 17:04     TODAY-DAY OF DISCHARGE:  Subjective:   [name] Fernandez today has no headache,no chest abdominal pain,no new weakness tingling or numbness, feels much better wants to go home today.   Objective:   Blood pressure (!) 155/72, pulse 64, temperature 98.2 F (36.8 C), temperature source Oral, resp.  rate 18, height 5\' 8"  (1.727 m), weight 80.7 kg, SpO2 96%.  Intake/Output Summary (Last 24 hours) at 09/23/2023 1440 Last data filed at 09/22/2023 2100 Gross per 24 hour  Intake 240 ml  Output 300 ml  Net -60 ml   Filed Weights   09/21/23 1305  Weight: 80.7 kg    Exam: Awake Alert, Oriented *3, No new F.N deficits, Normal affect [place].AT,PERRAL Supple Neck,No JVD, No cervical lymphadenopathy appriciated.  Symmetrical Chest wall movement, Good air movement bilaterally, CTAB RRR,No Gallops,Rubs or new Murmurs, No Parasternal Heave +ve B.Sounds, Abd Soft, Non tender, No organomegaly appriciated, No rebound -guarding or rigidity. No Cyanosis, Clubbing or edema, No new Rash or bruise   PERTINENT  RADIOLOGIC STUDIES: ECHOCARDIOGRAM COMPLETE Result Date: 09/23/2023    ECHOCARDIOGRAM REPORT   Patient Name:   ADEWALE PUCILLO Date of Exam: 09/23/2023 Medical Rec #:  [id number]      Height:       68.0 in Accession #:    [id number]     Weight:       178.0 lb Date of Birth:  05-16-1941      BSA:          1.945 m Patient Age:    82 years       BP:           139/85 mmHg Patient Gender: M              HR:           74 bpm. Exam Location:  Inpatient Procedure: 2D Echo, Cardiac Doppler and Color Doppler (Both Spectral and Color            Flow Doppler were utilized during procedure). Indications:    Syncope R55  History:        Patient has no prior history of Echocardiogram examinations.                 COPD, Signs/Symptoms:Syncope; Risk Factors:Hypertension,                 Diabetes and Dyslipidemia.  Sonographer:    Denese Finn RCS Referring Phys: [id number] Estil Heman [facility] IMPRESSIONS  1. Left ventricular ejection fraction, by estimation, is 60 to 65%. The left ventricle has normal function. The left ventricle has no regional wall motion abnormalities. Left ventricular diastolic parameters are consistent with Grade I diastolic dysfunction (impaired relaxation).  2. Right ventricular systolic function is normal. The right ventricular size is normal.  3. Left atrial size was mildly dilated.  4. The mitral valve is normal in structure. Mild mitral valve regurgitation. No evidence of mitral stenosis.  5. The aortic valve is tricuspid. Aortic valve regurgitation is not visualized. No aortic stenosis is present.  6. Aortic dilatation noted. There is borderline dilatation of the aortic arch, measuring 31 mm.  7. The inferior vena cava is normal in size with greater than 50% respiratory variability, suggesting right atrial pressure of 3 mmHg. Comparison(s): No prior Echocardiogram. FINDINGS  Left Ventricle: Left ventricular ejection fraction, by estimation, is 60 to 65%. The left ventricle has normal function. The left ventricle  has no regional wall motion abnormalities. Strain was performed and the global longitudinal strain is indeterminate. The left ventricular internal cavity size was normal in size. There is no left ventricular hypertrophy. Left ventricular diastolic parameters are consistent with Grade I diastolic dysfunction (impaired relaxation). Right Ventricle: The right ventricular size is normal. No increase in right ventricular wall thickness. Right ventricular  systolic function is normal. Left Atrium: Left atrial size was mildly dilated. Right Atrium: Right atrial size was normal in size. Pericardium: There is no evidence of pericardial effusion. Mitral Valve: The mitral valve is normal in structure. Mild mitral valve regurgitation. No evidence of mitral valve stenosis. Tricuspid Valve: The tricuspid valve is normal in structure. Tricuspid valve regurgitation is not demonstrated. No evidence of tricuspid stenosis. Aortic Valve: The aortic valve is tricuspid. Aortic valve regurgitation is not visualized. No aortic stenosis is present. Pulmonic Valve: The pulmonic valve was not well visualized. Pulmonic valve regurgitation is not visualized. Aorta: Aortic dilatation noted. There is borderline dilatation of the aortic arch, measuring 31 mm. Venous: The inferior vena cava is normal in size with greater than 50% respiratory variability, suggesting right atrial pressure of 3 mmHg. IAS/Shunts: The atrial septum is grossly normal. Additional Comments: 3D was performed not requiring image post processing on an independent workstation and was indeterminate.  LEFT VENTRICLE PLAX 2D LVIDd:         4.70 cm   Diastology LVIDs:         3.00 cm   LV e' medial:    6.20 cm/s LV PW:         1.00 cm   LV E/e' medial:  12.9 LV IVS:        0.90 cm   LV e' lateral:   10.70 cm/s LVOT diam:     1.90 cm   LV E/e' lateral: 7.5 LV SV:         57 LV SV Index:   29 LVOT Area:     2.84 cm  RIGHT VENTRICLE RV S prime:     16.50 cm/s TAPSE (M-mode): 2.6 cm  LEFT ATRIUM             Index        RIGHT ATRIUM           Index LA diam:        2.60 cm 1.34 cm/m   RA Area:     16.10 cm LA Vol (A2C):   44.6 ml 22.93 ml/m  RA Volume:   33.45 ml  17.20 ml/m LA Vol (A4C):   70.2 ml 36.09 ml/m LA Biplane Vol: 54.6 ml 28.07 ml/m  AORTIC VALVE LVOT Vmax:   73.40 cm/s LVOT Vmean:  54.800 cm/s LVOT VTI:    0.200 m  AORTA Ao Root diam: 3.60 cm MITRAL VALVE MV Area (PHT): 3.77 cm    SHUNTS MV Decel Time: 201 msec    Systemic VTI:  0.20 m MV E velocity: 80.10 cm/s  Systemic Diam: 1.90 cm MV A velocity: 82.00 cm/s MV E/A ratio:  0.98 Gloriann Larger MD Electronically signed by Gloriann Larger MD Signature Date/Time: 09/23/2023/2:12:54 PM    Final    MR BRAIN WO CONTRAST Result Date: 09/22/2023 CLINICAL DATA:  Initial evaluation for acute syncope/presyncope. Stroke suspected. EXAM: MRI HEAD WITHOUT CONTRAST TECHNIQUE: Multiplanar, multiecho pulse sequences of the brain and surrounding structures were obtained without intravenous contrast. COMPARISON:  CT from 09/21/2023. FINDINGS: Brain: Cerebral volume within normal limits. Patchy T2/FLAIR hyperintensity involving the periventricular and deep white matter both cerebral hemispheres, most characteristic of chronic microvascular ischemic disease, moderate in nature. No evidence for acute or subacute infarct. Gray-white matter differentiation maintained. No areas of chronic cortical infarction. No acute or chronic intracranial blood products. No mass lesion, midline shift or mass effect. No hydrocephalus or extra-axial fluid collection. Pituitary gland within normal limits. Vascular: Major  intracranial vascular flow voids are maintained. Skull and upper cervical spine: Craniocervical junction within normal limits. Bone marrow signal intensity normal. No scalp soft tissue abnormality. Sinuses/Orbits: Prior bilateral ocular lens replacement. Paranasal sinuses are largely clear. No mastoid effusion. Other: None. IMPRESSION: 1.  No acute intracranial abnormality. 2. 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 Electronically Signed   By: Virgia Griffins M.D.   On: 09/22/2023 03:31   DG Chest Port 1 View Result Date: 09/21/2023 CLINICAL DATA:  Chest pain EXAM: PORTABLE CHEST 1 VIEW COMPARISON:  X-ray 08/22/2019 FINDINGS: Numerous surgical clips along the lower mediastinum and left upper abdomen. Underinflation with some interstitial changes, possibly chronic. Normal cardiopericardial silhouette. No pneumothorax, effusion or edema. No consolidation. Overlapping cardiac leads. Osteopenia and degenerative changes. Elevated right humeral head. Please correlate for a evidence of a rotator cuff tear. IMPRESSION: Underinflation with chronic changes.  Surgical clips. Electronically Signed   By: Adrianna Horde M.D.   On: 09/21/2023 18:03   CT ABDOMEN PELVIS W CONTRAST Result Date: 09/21/2023 CLINICAL DATA:  Unintended weight loss.  Neurologic deficit. EXAM: CT ABDOMEN AND PELVIS WITH CONTRAST TECHNIQUE: Multidetector CT imaging of the abdomen and pelvis was performed using the standard protocol following bolus administration of intravenous contrast. RADIATION DOSE REDUCTION: This exam was performed according to the departmental dose-optimization program which includes automated exposure control, adjustment of the mA and/or kV according to patient size and/or use of iterative reconstruction technique. CONTRAST:  100mL OMNIPAQUE IOHEXOL 350 MG/ML SOLN COMPARISON:  Prior chest and spine imaging. FINDINGS: Lower chest: There are areas of interstitial septal thickening along the lung bases. Possible areas of scarring and fibrotic change. No significant pleural effusion. Coronary artery calcifications are noted. Focal hiatal hernia. Adjacent surgical clips along the GE junction. There is some air along the margin of the hernia and lower esophagus along the wall. By history in epic the patient has a history of hiatal hernia repair. On  coronal imaging the air is to the left of the lower esophagus and could be within gastric folds. There is some thickening of the walls of the distal esophagus and upper stomach. Please correlate for any symptoms and further workup when appropriate such as upper GI/esophagram versus endoscopy Hepatobiliary: Slightly nodular contour to the liver. No space-occupying liver lesion. Patent portal vein. Stones in the gallbladder. Pancreas: Severe fatty atrophy of the pancreas. Spleen: Surgical clips seen in the area of the left upper quadrant with a small area of splenic tissue. Please correlate with previous intervention. Adrenals/Urinary Tract: Adrenal glands are preserved. Mild bilateral renal atrophy. No enhancing renal mass. No collecting system dilatation. The ureters have normal course and caliber down to the bladder. 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 At the diverticulum is a nodular masslike soft tissue focus. On coronal series 4, image 43 this measures 17 mm. An intrinsic bladder neoplasm is possible. There is extension of this nodular tissue into the diverticulum itself on axial series 2, image 81. Overall in the axial plane this measures 19 mm. Stomach/Bowel: On this non oral contrast exam large bowel is nondilated. There are scattered areas of stool particularly in the sigmoid and rectum. Areas along the ascending colon as well. There is a redundant course of the sigmoid colon into the right mid abdomen. There also some portions of the colon such as the descending colon which are collapsed. Stomach is relatively collapsed. Slight fold and wall thickening suggested. Please correlate with symptoms. Small bowel is nondilated. Vascular/Lymphatic: Aortic atherosclerosis. No enlarged  abdominal or pelvic lymph nodes. Reproductive: Prostate is unremarkable. Other: No free air or free fluid. There is a midline anterior abdominal wall fat containing hernia with some soft tissue thickening along the hernia sac.  Musculoskeletal: Multilevel compression deformities along the thoracic spine with augmentation cement. There is also some compression deformities at L2 and L3. Please correlate with history. Critical Value/emergent results were called by telephone at the time of interpretation on 09/21/2023 at 5:59 pm to provider [facility] , who verbally acknowledged these results. IMPRESSION: Nodular area of mass along left side of the urinary bladder along the opening of the left lateral bladder diverticulum. A bladder neoplasm is possible. Recommend further evaluation. Nodular liver with fatty infiltration.  Gallstones. Surgical changes is seen at the diaphragm and left upper quadrant. Patient's history of a fundal location. There is wall thickening along the lower esophagus and upper stomach with some atypical distribution of air to the left of the lower thoracic esophagus. Based on appearance overall and patient's history this could be air within fold along a fundoplication. Please correlate however with any particular esophageal or gastric symptoms and recommend further workup when appropriate. Electronically Signed   By: Adrianna Horde M.D.   On: 09/21/2023 18:02   CT ANGIO HEAD NECK W WO CM Result Date: 09/21/2023 CLINICAL DATA:  Neuro deficit, acute, stroke suspected. EXAM: CT ANGIOGRAPHY HEAD AND NECK WITH AND WITHOUT CONTRAST TECHNIQUE: Multidetector CT imaging of the head and neck was performed using the standard protocol during bolus administration of intravenous contrast. Multiplanar CT image reconstructions and MIPs were obtained to evaluate the vascular anatomy. Carotid stenosis measurements (when applicable) are obtained utilizing NASCET criteria, using the distal internal carotid diameter as the denominator. RADIATION DOSE REDUCTION: This exam was performed according to the departmental dose-optimization program which includes automated exposure control, adjustment of the mA and/or kV according to patient  size and/or use of iterative reconstruction technique. CONTRAST:  100mL OMNIPAQUE IOHEXOL 350 MG/ML SOLN COMPARISON:  None Available. FINDINGS: CT HEAD FINDINGS Brain: No acute hemorrhage. Cortical gray-white differentiation is preserved. 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 Prominence of the ventricles and sulci within normal limits for age. No extra-axial collection. Basilar cisterns are patent. Vascular: No hyperdense vessel or unexpected calcification. Skull: No calvarial fracture or suspicious bone lesion. Skull base is unremarkable. Sinuses/Orbits: No acute finding. Other: None. Review of the MIP images confirms the above findings CTA NECK FINDINGS Aortic arch: Three-vessel arch configuration. Atherosclerotic calcifications of the aortic arch and arch vessel origins. Arch vessel origins are patent. Right carotid system: No evidence of dissection, stenosis (50% or greater), or occlusion. Mild calcified plaque along the mid right CCA and carotid bulb. Left carotid system: No evidence of dissection, stenosis (50% or greater), or occlusion. Mild calcified plaque along the left carotid bulb and proximal left cervical ICA. Tortuous distal left cervical ICA. Vertebral arteries: Codominant. No evidence of dissection, stenosis (50% or greater), or occlusion. Skeleton: Mild cervical spondylosis without high-grade spinal canal stenosis. No suspicious bone lesions. Other neck: Unremarkable. Upper chest: Emphysema in the lung apices. Review of the MIP images confirms the above findings CTA HEAD FINDINGS Anterior circulation: Intracranial ICAs are patent without stenosis or aneurysm. The proximal ACAs and MCAs are patent without stenosis or aneurysm. Distal branches are symmetric. Posterior circulation: Normal basilar artery. The SCAs, AICAs and PICAs are patent proximally. The PCAs are patent proximally without stenosis or aneurysm. Distal branches are  symmetric. Venous  sinuses: As permitted by contrast timing, patent. Anatomic variants: Persistent fetal origin of the bilateral PCAs with hypoplastic P1 segments. Review of the MIP images confirms the above findings IMPRESSION: 1. No acute intracranial abnormality. 2. No large vessel occlusion, hemodynamically significant stenosis, or aneurysm in the head or neck. Aortic Atherosclerosis (ICD10-I70.0) and Emphysema (ICD10-J43.9). Electronically Signed   By: Audra Blend M.D.   On: 09/21/2023 17:04     PERTINENT LAB RESULTS: CBC: Recent Labs    09/21/23 1324  WBC 8.3  HGB 11.1*  HCT 33.4*  PLT 451*   CMET CMP     Component Value Date/Time   NA 138 09/21/2023 1324   NA 141 10/15/2012 1339   K 3.8 09/21/2023 1324   K 4.2 10/15/2012 1339   CL 102 09/21/2023 1324   CL 103 10/15/2012 1339   CO2 28 09/21/2023 1324   CO2 28 10/15/2012 1339   GLUCOSE 108 (H) 09/21/2023 1324   GLUCOSE 140 (H) 10/15/2012 1339   BUN 23 09/21/2023 1324   BUN 10.8 10/15/2012 1339   CREATININE 1.10 09/21/2023 1324   CREATININE 1.36 (H) 05/03/2023 1613   CREATININE 1.2 10/15/2012 1339   CALCIUM 9.0 09/21/2023 1324   CALCIUM 9.0 10/15/2012 1339   PROT 7.5 09/21/2023 1324   PROT 7.8 10/15/2012 1339   ALBUMIN 3.9 09/21/2023 1324   ALBUMIN 3.7 10/15/2012 1339   AST 28 09/21/2023 1324   AST 45 (H) 10/15/2012 1339   ALT 22 09/21/2023 1324   ALT 43 10/15/2012 1339   ALKPHOS 85 09/21/2023 1324   ALKPHOS 110 10/15/2012 1339   BILITOT 0.7 09/21/2023 1324   BILITOT 0.65 10/15/2012 1339   EGFR 52 (L) 05/03/2023 1613   GFRNONAA >60 09/21/2023 1324   GFRNONAA 65 09/28/2020 1716    GFR Estimated Creatinine Clearance: 50.1 mL/min (by C-G formula based on SCr of 1.1 mg/dL). Recent Labs    09/21/23 1324  LIPASE 16   No results for input(s): "CKTOTAL", "CKMB", "CKMBINDEX", "TROPONINI" in the last 72 hours. Invalid input(s): "POCBNP" No results for input(s): "DDIMER" in the last 72 hours. Recent Labs     09/22/23 0654  HGBA1C 6.2*   No results for input(s): "CHOL", "HDL", "LDLCALC", "TRIG", "CHOLHDL", "LDLDIRECT" in the last 72 hours. Recent Labs    09/21/23 1607  TSH 2.100   No results for input(s): "VITAMINB12", "FOLATE", "FERRITIN", "TIBC", "IRON", "RETICCTPCT" in the last 72 hours. Coags: No results for input(s): "INR" in the last 72 hours.  Invalid input(s): "PT" Microbiology: No results found for this or any previous visit (from the past 240 hours).  FURTHER DISCHARGE INSTRUCTIONS:  Get Medicines reviewed and adjusted: Please take all your medications with you for your next visit with your Primary MD  Laboratory/radiological data: Please request your Primary MD to go over all hospital tests and procedure/radiological results at the follow up, please ask your Primary MD to get all Hospital records sent to his/her office.  In some cases, they will be blood work, cultures and biopsy results pending at the time of your discharge. Please request that your primary care M.D. goes through all the records of your hospital data and follows up on these results.  Also Note the following: If you experience worsening of your admission symptoms, develop shortness of breath, life threatening emergency, suicidal or homicidal thoughts you must seek medical attention immediately by calling 911 or calling your MD immediately  if symptoms less severe.  You must read complete instructions/literature along with all  the possible adverse reactions/side effects for all the Medicines you take and that have been prescribed to you. Take any new Medicines after you have completely understood and accpet all the possible adverse reactions/side effects.   Do not drive when taking Pain medications or sleeping medications (Benzodaizepines)  Do not take more than prescribed Pain, Sleep and Anxiety Medications. It is not advisable to combine anxiety,sleep and pain medications without talking with your primary  care practitioner  Special Instructions: If you have smoked or chewed Tobacco  in the last 2 yrs please stop smoking, stop any regular Alcohol  and or any Recreational drug use.  Wear Seat belts while driving.  Please note: You were cared for by a hospitalist during your hospital stay. Once you are discharged, your primary care physician will handle any further medical issues. Please note that NO REFILLS for any discharge medications will be authorized once you are discharged, as it is imperative that you return to your primary care physician (or establish a relationship with a primary care physician if you do not have one) for your post hospital discharge needs so that they can reassess your need for medications and monitor your lab values.  Total Time spent coordinating discharge including counseling, education and face to face time equals greater than 30 minutes.  SignedKimberly Penna 09/23/2023 2:40 PM

## 2023-09-23 NOTE — Plan of Care (Signed)
  Problem: Education: Goal: Knowledge of General Education information will improve Description Including pain rating scale, medication(s)/side effects and non-pharmacologic comfort measures Outcome: Progressing   Problem: Health Behavior/Discharge Planning: Goal: Ability to manage health-related needs will improve Outcome: Progressing   Problem: Clinical Measurements: Goal: Ability to maintain clinical measurements within normal limits will improve Outcome: Progressing   Problem: Activity: Goal: Risk for activity intolerance will decrease Outcome: Progressing   Problem: Nutrition: Goal: Adequate nutrition will be maintained Outcome: Progressing   Problem: Coping: Goal: Level of anxiety will decrease Outcome: Progressing   Problem: Safety: Goal: Ability to remain free from injury will improve Outcome: Progressing   Problem: Skin Integrity: Goal: Risk for impaired skin integrity will decrease Outcome: Progressing   

## 2023-09-23 NOTE — Evaluation (Signed)
 Occupational Therapy Evaluation Patient Details Name: [name] Fernandez MRN: [id number] DOB: 02-09-1941 Today's Date: 09/23/2023   History of Present Illness   83 y.o. male presented 4/11 with 30 pound unintentional weight loss in a period of 3 months-along with poor oral intake-he became dizzy for the past several days along. Had a Syncopal episode-hence presented to the ED for further evaluation. 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  Nodular liver with fatty infiltration. PMHx: hypertension, celiac disease, HLD, chronic left ankle ulcer/chronic venous insufficiency, DM-2, GERD-s/p Nissen's fundoplication.     Clinical Impressions PTA, pt lived with wife and reports he has not been driving since episodes of dizziness and diplopia, but otherwise is independent. Upon eval, pt performing OOB ADL grossly at supervision level. Pt needing min cues for executive function to follow 3 step way finding task. Pt with questionable impairment in R upper quadrant visual fields assessment demonstrating consistent attempts to compensate. Recommending follow up with OP ophthalmologist. Suspect pt to be near baseline; no OT follow up recommenced, however, will follow acutely.      If plan is discharge home, recommend the following:   Assistance with cooking/housework;Assist for transportation;Help with stairs or ramp for entrance     Functional Status Assessment   Patient has had a recent decline in their functional status and demonstrates the ability to make significant improvements in function in a reasonable and predictable amount of time.     Equipment Recommendations   None recommended by OT     Recommendations for Other Services         Precautions/Restrictions   Precautions Precautions: Fall Recall of Precautions/Restrictions: Intact Precaution/Restrictions Comments: post op Buyer, retail Restrictions Weight Bearing  Restrictions Per Provider Order: No     Mobility Bed Mobility Overal bed mobility: Modified Independent                  Transfers Overall transfer level: Needs assistance Equipment used: None Transfers: Sit to/from Stand Sit to Stand: Supervision           General transfer comment: for safety      Balance Overall balance assessment: Needs assistance Sitting-balance support: No upper extremity supported, Feet supported Sitting balance-Leahy Scale: Normal     Standing balance support: No upper extremity supported Standing balance-Leahy Scale: Good                             ADL either performed or assessed with clinical judgement   ADL Overall ADL's : Needs assistance/impaired Eating/Feeding: Independent   Grooming: Modified independent;Standing   Upper Body Bathing: Modified independent;Sitting   Lower Body Bathing: Supervison/ safety;Sit to/from stand   Upper Body Dressing : Modified independent;Sitting   Lower Body Dressing: Supervision/safety;Sit to/from stand   Toilet Transfer: Supervision/safety;Ambulation           Functional mobility during ADLs: Contact guard assist       Vision Baseline Vision/History: 0 No visual deficits;1 Wears glasses Ability to See in Adequate Light: 0 Adequate Patient Visual Report: No change from baseline Vision Assessment?: Vision impaired- to be further tested in functional context;Yes Alignment/Gaze Preference: Within Defined Limits Tracking/Visual Pursuits: Able to track stimulus in all quads without difficulty Saccades: Within functional limits Convergence: Within functional limits Visual Fields:  (R upper quadrant with pt using compensatory strategies to determine number of fingers OT holding up) Diplopia Assessment:  (denies) Additional Comments:  pt states he has not had diplopia for several days     Perception         Praxis         Pertinent Vitals/Pain Pain Assessment Pain  Assessment: No/denies pain     Extremity/Trunk Assessment Upper Extremity Assessment Upper Extremity Assessment: RUE deficits/detail RUE Deficits / Details: appears to have R RTC pathology. able to abduct 0-70; perform forward flexion 0-120   Lower Extremity Assessment Lower Extremity Assessment: Defer to PT evaluation       Communication Communication Communication: No apparent difficulties   Cognition Arousal: Alert Behavior During Therapy: WFL for tasks assessed/performed Cognition: Cognition impaired           Executive functioning impairment (select all impairments): Problem solving, Organization OT - Cognition Comments: suspect near baseline. Min cues for problem solving during 3 step way finding during hall and organized method                 Following commands: Intact       Cueing  General Comments   Cueing Techniques: Verbal cues      Exercises     Shoulder Instructions      Home Living Family/patient expects to be discharged to:: Private residence Living Arrangements: Spouse/significant other Available Help at Discharge: Family;Available 24 hours/day Type of Home: House Home Access: Ramped entrance     Home Layout: One level     Bathroom Shower/Tub: Producer, television/film/video: Standard     Home Equipment: Cane - single Librarian, academic (2 wheels)          Prior Functioning/Environment Prior Level of Function : Independent/Modified Independent;Driving;History of Falls (last six months)             Mobility Comments: Uses SPC, post op shoe for Lt ulcer. ADLs Comments: Ind; has not been driving for ~3 weeks secondary to recent dizziness and intermittent diplopia    OT Problem List: Decreased strength;Decreased activity tolerance;Impaired balance (sitting and/or standing);Impaired vision/perception   OT Treatment/Interventions: Self-care/ADL training;Therapeutic exercise;DME and/or AE instruction;Balance  training;Patient/family education;Therapeutic activities      OT Goals(Current goals can be found in the care plan section)   Acute Rehab OT Goals Patient Stated Goal: go home OT Goal Formulation: With patient Time For Goal Achievement: 10/07/23 Potential to Achieve Goals: Good   OT Frequency:  Min 1X/week    Co-evaluation              AM-PAC OT "6 Clicks" Daily Activity     Outcome Measure Help from another person eating meals?: None Help from another person taking care of personal grooming?: None Help from another person toileting, which includes using toliet, bedpan, or urinal?: A Little Help from another person bathing (including washing, rinsing, drying)?: A Little Help from another person to put on and taking off regular upper body clothing?: None Help from another person to put on and taking off regular lower body clothing?: A Little 6 Click Score: 21   End of Session Equipment Utilized During Treatment: Gait belt Nurse Communication: Mobility status  Activity Tolerance: Patient tolerated treatment well Patient left: in bed;with call bell/phone within reach  OT Visit Diagnosis: Unsteadiness on feet (R26.81);Muscle weakness (generalized) (M62.81);Low vision, both eyes (H54.2)                Time: 1334-1400 OT Time Calculation (min): 26 min Charges:  OT General Charges $OT Visit: 1 Visit OT Evaluation $OT Eval Low Complexity: 1 Low OT  Treatments $Self Care/Home Management : 8-22 mins  Karilyn Ouch, OTR/L [facility] Acute Rehabilitation Office: [phone]   Emery Hans 09/23/2023, 2:50 PM

## 2023-09-23 NOTE — Progress Notes (Signed)
*  PRELIMINARY RESULTS* Echocardiogram 2D Echocardiogram has been performed.  Bernis Brisker 09/23/2023, 1:54 PM

## 2023-10-16 ENCOUNTER — Ambulatory Visit: Payer: Medicare Other | Admitting: Internal Medicine

## 2023-10-16 ENCOUNTER — Ambulatory Visit: Admitting: Orthopedic Surgery

## 2023-10-17 ENCOUNTER — Ambulatory Visit: Admitting: Orthopedic Surgery

## 2023-10-17 DIAGNOSIS — L97322 Non-pressure chronic ulcer of left ankle with fat layer exposed: Secondary | ICD-10-CM | POA: Diagnosis not present

## 2023-10-17 DIAGNOSIS — Z945 Skin transplant status: Secondary | ICD-10-CM

## 2023-10-17 DIAGNOSIS — L97323 Non-pressure chronic ulcer of left ankle with necrosis of muscle: Secondary | ICD-10-CM

## 2023-10-18 ENCOUNTER — Encounter: Payer: Self-pay | Admitting: Orthopedic Surgery

## 2023-10-18 NOTE — Progress Notes (Signed)
 Office Visit Note   Patient: [name] Fernandez           Date of Birth: [birth year]           MRN: [id number] Visit Date: 10/17/2023              Requested by: Vangie Genet, MD [address] [place],  [place] [address] PCP: Patient, No Pcp Per  Chief Complaint  Patient presents with   Left Ankle - Follow-up      HPI: Patient is an 83 year old gentleman who presents in follow-up for chronic lateral left ankle ulcer.  Patient states he has recently been hospitalized for dehydration.  Assessment & Plan: Visit Diagnoses:  1. Ankle ulcer, left, with necrosis of muscle (HCC)   2. H/O skin graft   3. 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  Follow-Up Instructions: Return in about 4 weeks (around 11/14/2023).   Ortho Exam  Patient is alert, oriented, no adenopathy, well-dressed, normal affect, normal respiratory effort. Examination left lower extremity patient has increased venous stasis insufficiency with pitting edema there is no cellulitis.  The wound bed is flat with healthy granulation tissue.  Wound currently measures 4.5 x 5 cm in its longest dimensions.  No surrounding cellulitis no drainage.  Imaging: No results found. No images are attached to the encounter.  Labs: Lab Results  Component Value Date   HGBA1C 6.2 (H) 09/22/2023   HGBA1C 6.5 (H) 04/10/2023   HGBA1C 6.3 (H) 10/05/2022   ESRSEDRATE 34 (H) 01/15/2023   ESRSEDRATE 32 (H) 06/15/2022   ESRSEDRATE 40 (H) 06/14/2022   CRP 0.7 01/15/2023   CRP 0.8 06/15/2022   CRP <0.5 06/14/2022   LABURIC 7.8 05/20/2014   LABURIC 6.8 05/20/2013   REPTSTATUS 01/17/2023 FINAL 01/12/2023   GRAMSTAIN NO WBC SEEN NO ORGANISMS SEEN  01/12/2023   CULT  01/12/2023    No growth aerobically or anaerobically. Performed at [facility], [address], [place], [place] [address]    LABORGA PSEUDOMONAS AERUGINOSA  11/10/2022   LABORGA ENTEROCOCCUS FAECALIS 11/10/2022     Lab Results  Component Value Date   ALBUMIN 3.9 09/21/2023   ALBUMIN 2.9 (L) 06/16/2022   ALBUMIN 3.8 06/14/2022   PREALBUMIN 18 06/14/2022    Lab Results  Component Value Date   MG 1.8 04/10/2023   MG 1.8 06/19/2022   MG 1.9 04/04/2022   Lab Results  Component Value Date   VD25OH 45 04/10/2023   VD25OH 57 10/05/2022   VD25OH 59 04/04/2022    Lab Results  Component Value Date   PREALBUMIN 18 06/14/2022      Latest Ref Rng & Units 09/21/2023    1:24 PM 04/10/2023    3:22 PM 01/15/2023    4:53 PM  CBC EXTENDED  WBC 4.0 - 10.5 K/uL 8.3  8.3  8.9   RBC 4.22 - 5.81 MIL/uL 3.84  3.65  3.55   Hemoglobin 13.0 - 17.0 g/dL 96.2  95.2  84.1   HCT 39.0 - 52.0 % 33.4  34.3  33.0   Platelets 150 - 400 K/uL 451  474  340   NEUT# 1,500 - 7,800 cells/uL  4,382  4.8   Lymph# 0.7 - 4.0 K/uL   2.3      There is no height or weight on file to calculate BMI.  Orders:  No orders of the defined  types were placed in this encounter.  No orders of the defined types were placed in this encounter.    Procedures: No procedures performed  Clinical Data: No additional findings.  ROS:  All other systems negative, except as noted in the HPI. Review of Systems  Objective: Vital Signs: There were no vitals taken for this visit.  Specialty Comments:  No specialty comments available.  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/16/2022   Osteomyelitis (HCC) 06/16/2022   Tenosynovitis of tibialis posterior tendon 06/15/2022   Wound infection 06/15/2022   Chronic ulcer of  left ankle (HCC) 06/14/2022   Anemia 03/13/2019   Chronic venous insufficiency 03/12/2019   CKD (chronic kidney disease) stage 2, 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/2015   COPD (chronic obstructive pulmonary disease) (HCC) 12/08/2015   Chronic pain syndrome 05/26/2015   DDD (degenerative disc disease), lumbar 05/26/2015   Medication management 05/26/2015   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   Lumbago with sciatica 08/06/2014   Hyperlipidemia 08/20/2013   Essential hypertension    Abnormal glucose    Vitamin D  deficiency    Arthritis    Vitamin B12 deficiency    Celiac disease 06/19/2012   GERD (gastroesophageal reflux disease) 04/30/2012   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 tube) procedure 04/30/2012   Hypothyroidism 04/30/2012   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   Past Medical History:  Diagnosis Date   Anemia    Celiac disease    diagnoses 06/24/12   Chronic ulcer of ankle (HCC)    left   Esophageal stricture    GERD (gastroesophageal reflux disease)    Hiatal hernia    Hypertension    patient states " no current HTN"   Hypothyroidism    Pre-diabetes    Prediabetes    Shortness of breath    from oxycodone  at times   Vitamin B12 deficiency    Vitamin D  deficiency     Family History  Problem Relation Age of Onset   Esophageal cancer Father     Past Surgical History:  Procedure Laterality Date   APPENDECTOMY     APPLICATION OF WOUND VAC Left 01/12/2023   Procedure: APPLICATION OF WOUND VAC;  Surgeon: Timothy Ford, MD;  Location: [facility] OR;  Service: Orthopedics;  Laterality: Left;   BACK SURGERY     3 diff surgeries for vertebrea broken   EYE SURGERY     bilateral cataract surgery   HERNIA REPAIR  02/10/1949   bilateral groin as child   HIATAL HERNIA REPAIR     I & D EXTREMITY Left 06/16/2022   Procedure: IRRIGATION AND DEBRIDEMENT ANKLE;  Surgeon: Timothy Ford, MD;  Location: [facility] OR;  Service: Orthopedics;  Laterality: Left;   I & D EXTREMITY Left 07/21/2022    Procedure: LEFT ANKLE DEBRIDEMENT;  Surgeon: Timothy Ford, MD;  Location: [facility] OR;  Service: Orthopedics;  Laterality: Left;   I & D EXTREMITY Left 11/10/2022   Procedure: LEFT ANKLE DEBRIDEMENT;  Surgeon: Timothy Ford, MD;  Location: [facility] OR;  Service: Orthopedics;  Laterality: Left;   I & D EXTREMITY Left 01/12/2023   Procedure: LEFT ANKLE DEBRIDEMENT;  Surgeon: Timothy Ford, MD;  Location: [facility] OR;  Service: Orthopedics;  Laterality: Left;   JOINT REPLACEMENT  04/12/2010   right knee replacement   TONSILLECTOMY     as child   TOTAL KNEE REVISION  11/20/2011   Procedure: TOTAL KNEE REVISION;  Surgeon: Bevin Bucks, MD;  Location: [facility] ORS;  Service: Orthopedics;  Laterality: Right;  Right Total Knee Revision   Social History   Occupational History   Occupation: Retired    Associate Professor: [employer]  Tobacco Use   Smoking status: Former    Current packs/day: 0.00    Average packs/day: 1 pack/day for 20.0 years (20.0 ttl pk-yrs)    Types: Cigarettes    Start date: 06/27/1960    Quit date: 06/27/1980    Years since quitting: 43.3    Passive exposure: Past   Smokeless tobacco: Never  Vaping Use   Vaping status: Never Used  Substance and Sexual Activity   Alcohol use: No   Drug use: No   Sexual activity: Yes

## 2023-11-01 DIAGNOSIS — L97929 Non-pressure chronic ulcer of unspecified part of left lower leg with unspecified severity: Secondary | ICD-10-CM | POA: Diagnosis not present

## 2023-11-02 DIAGNOSIS — L97929 Non-pressure chronic ulcer of unspecified part of left lower leg with unspecified severity: Secondary | ICD-10-CM | POA: Diagnosis not present

## 2023-11-20 ENCOUNTER — Ambulatory Visit: Admitting: Orthopedic Surgery

## 2023-11-21 DIAGNOSIS — D414 Neoplasm of uncertain behavior of bladder: Secondary | ICD-10-CM | POA: Diagnosis not present

## 2023-11-22 ENCOUNTER — Other Ambulatory Visit: Payer: Self-pay | Admitting: Urology

## 2023-11-27 ENCOUNTER — Ambulatory Visit: Admitting: Orthopedic Surgery

## 2023-11-27 DIAGNOSIS — L97323 Non-pressure chronic ulcer of left ankle with necrosis of muscle: Secondary | ICD-10-CM | POA: Diagnosis not present

## 2023-11-27 DIAGNOSIS — Z945 Skin transplant status: Secondary | ICD-10-CM | POA: Diagnosis not present

## 2023-12-04 ENCOUNTER — Encounter: Payer: Self-pay | Admitting: Orthopedic Surgery

## 2023-12-04 ENCOUNTER — Ambulatory Visit: Admitting: Orthopedic Surgery

## 2023-12-04 DIAGNOSIS — Z945 Skin transplant status: Secondary | ICD-10-CM

## 2023-12-04 DIAGNOSIS — L97322 Non-pressure chronic ulcer of left ankle with fat layer exposed: Secondary | ICD-10-CM

## 2023-12-04 NOTE — Progress Notes (Addendum)
 Office Visit Note   Patient: [name] Fernandez           Date of Birth: 1940-10-17           MRN: [id number] Visit Date: 12/04/2023              Requested by: No referring provider defined for this encounter. PCP: Patient, No Pcp Per  Chief Complaint  Patient presents with   Left Ankle - Follow-up      HPI: Patient is a 83 year old gentleman seen in follow-up For chronic ulcer left ankle.  He has been using Vashe dressing plus a Dynaflex wrap.   Assessment & Plan: Visit Diagnoses:  1. 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 H/O skin graft     Plan: applied a Vashe dressing plus a Dynaflex wrap. Elevation when at rest.    Follow-Up Instructions: Return in about 1 week (around 12/11/2023).   Ortho Exam  Patient is alert, oriented, no adenopathy, well-dressed, normal affect, normal respiratory effort. Improved LE edema with wrap and elevation.  Wound size  6 cm x 3.5 cm.  There is minimal granulation tissue. 75 %  fibrinous exudative tissue covering the wound.     Imaging: No results found.   Labs: Lab Results  Component Value Date   HGBA1C 6.2 (H) 09/22/2023   HGBA1C 6.5 (H) 04/10/2023   HGBA1C 6.3 (H) 10/05/2022   ESRSEDRATE 34 (H) 01/15/2023   ESRSEDRATE 32 (H) 06/15/2022   ESRSEDRATE 40 (H) 06/14/2022   CRP 0.7 01/15/2023   CRP 0.8 06/15/2022   CRP <0.5 06/14/2022   LABURIC 7.8 05/20/2014   LABURIC 6.8 05/20/2013   REPTSTATUS 01/17/2023 FINAL 01/12/2023   GRAMSTAIN NO WBC SEEN NO ORGANISMS SEEN  01/12/2023   CULT  01/12/2023    No growth aerobically or anaerobically. Performed at [facility], [address] [place], [place] [address]    LABORGA PSEUDOMONAS AERUGINOSA 11/10/2022   LABORGA ENTEROCOCCUS FAECALIS 11/10/2022     Lab Results  Component Value Date   ALBUMIN 3.9 09/21/2023   ALBUMIN 2.9 (L) 06/16/2022   ALBUMIN 3.8 06/14/2022   PREALBUMIN 18 06/14/2022    Lab Results  Component Value Date   MG 1.8  04/10/2023   MG 1.8 06/19/2022   MG 1.9 04/04/2022   Lab Results  Component Value Date   VD25OH 45 04/10/2023   VD25OH 57 10/05/2022   VD25OH 59 04/04/2022    Lab Results  Component Value Date   PREALBUMIN 18 06/14/2022      Latest Ref Rng & Units 09/21/2023    1:24 PM 04/10/2023    3:22 PM 01/15/2023    4:53 PM  CBC EXTENDED  WBC 4.0 - 10.5 K/uL 8.3  8.3  8.9   RBC 4.22 - 5.81 MIL/uL 3.84  3.65  3.55   Hemoglobin 13.0 - 17.0 g/dL 88.8  88.6  88.7   HCT 39.0 - 52.0 % 33.4  34.3  33.0   Platelets 150 - 400 K/uL 451  474  340   NEUT# 1,500 - 7,800 cells/uL  4,382  4.8   Lymph# 0.7 - 4.0 K/uL   2.3      There is no height or weight on file to calculate BMI.  Orders:  No orders of the defined types were placed in this encounter.  No orders of the defined types were placed in this encounter.    Procedures: No procedures performed  Clinical  Data: No additional findings.  ROS:  All other systems negative, except as noted in the HPI. Review of Systems  Objective: Vital Signs: There were no vitals taken for this visit.  Specialty Comments:  No specialty comments available.  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/16/2022   Osteomyelitis (HCC) 06/16/2022   Tenosynovitis of tibialis posterior tendon 06/15/2022   Wound infection 06/15/2022   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   Anemia 03/13/2019   Chronic venous insufficiency 03/12/2019   CKD (chronic kidney disease) stage 2, 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/2015   COPD (chronic obstructive pulmonary disease) (HCC) 12/08/2015   Chronic pain syndrome 05/26/2015    DDD (degenerative disc disease), lumbar 05/26/2015   Medication management 05/26/2015   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   Lumbago with sciatica 08/06/2014   Hyperlipidemia 08/20/2013   Essential hypertension    Abnormal glucose    Vitamin D  deficiency    Arthritis    Vitamin B12 deficiency    Celiac disease 06/19/2012   GERD (gastroesophageal reflux disease) 04/30/2012   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 tube) procedure 04/30/2012   Hypothyroidism 04/30/2012   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   Past Medical History:  Diagnosis Date   Anemia    Celiac disease    diagnoses 06/24/12   Chronic ulcer of ankle (HCC)    left   Esophageal stricture    GERD (gastroesophageal reflux disease)    Hiatal hernia    Hypertension    patient states  no current HTN   Hypothyroidism    Pre-diabetes    Prediabetes    Shortness of breath    from oxycodone  at times   Vitamin B12 deficiency    Vitamin D  deficiency     Family History  Problem Relation Age of Onset   Esophageal cancer Father     Past Surgical History:  Procedure Laterality Date   APPENDECTOMY     APPLICATION OF WOUND VAC Left 01/12/2023   Procedure: APPLICATION OF WOUND VAC;  Surgeon: Harden Jerona GAILS, MD;  Location: [facility] OR;  Service: Orthopedics;  Laterality: Left;   BACK SURGERY     3 diff surgeries for vertebrea broken   EYE SURGERY     bilateral cataract surgery   HERNIA REPAIR  02/10/1949   bilateral groin as child   HIATAL HERNIA REPAIR     I & D EXTREMITY Left 06/16/2022   Procedure: IRRIGATION AND DEBRIDEMENT ANKLE;  Surgeon: Harden Jerona GAILS, MD;  Location: [facility] OR;  Service: Orthopedics;  Laterality: Left;   I & D EXTREMITY Left 07/21/2022   Procedure: LEFT ANKLE DEBRIDEMENT;  Surgeon: Harden Jerona GAILS, MD;  Location: [facility] OR;  Service: Orthopedics;  Laterality: Left;   I & D EXTREMITY Left 11/10/2022   Procedure: LEFT ANKLE DEBRIDEMENT;  Surgeon: Harden Jerona GAILS, MD;  Location: [facility] OR;  Service:  Orthopedics;  Laterality: Left;   I & D EXTREMITY Left 01/12/2023   Procedure: LEFT ANKLE DEBRIDEMENT;  Surgeon: Harden Jerona GAILS, MD;  Location: [facility] OR;  Service: Orthopedics;  Laterality: Left;   JOINT REPLACEMENT  04/12/2010   right knee replacement  TONSILLECTOMY     as child   TOTAL KNEE REVISION  11/20/2011   Procedure: TOTAL KNEE REVISION;  Surgeon: Donnice JONETTA Car, MD;  Location: [facility] ORS;  Service: Orthopedics;  Laterality: Right;  Right Total Knee Revision   Social History   Occupational History   Occupation: Retired    Associate Professor: [employer]  Tobacco Use   Smoking status: Former    Current packs/day: 0.00    Average packs/day: 1 pack/day for 20.0 years (20.0 ttl pk-yrs)    Types: Cigarettes    Start date: 06/27/1960    Quit date: 06/27/1980    Years since quitting: 43.4    Passive exposure: Past   Smokeless tobacco: Never  Vaping Use   Vaping status: Never Used  Substance and Sexual Activity   Alcohol use: No   Drug use: No   Sexual activity: Yes

## 2023-12-04 NOTE — Progress Notes (Signed)
 Office Visit Note   Patient: [name] Fernandez           Date of Birth: [birth year]           MRN: [id number] Visit Date: 11/27/2023              Requested by: No referring provider defined for this encounter. PCP: Patient, No Pcp Per  Chief Complaint  Patient presents with   Left Ankle - Wound Check      HPI: Patient is a 83 year old gentleman seen in follow-up For chronic ulcer left ankle.  Patient feels like the ulcer has stalled he feels like he has increased swelling.  Assessment & Plan: Visit Diagnoses:  1. Ankle ulcer, left, with necrosis of muscle (HCC)   2. H/O skin graft     Plan: Will apply a Vashe dressing plus a Dynaflex wrap.  Follow-Up Instructions: Return in about 1 week (around 12/04/2023).   Ortho Exam  Patient is alert, oriented, no adenopathy, well-dressed, normal affect, normal respiratory effort. Examination patient has increased venous swelling he has a new blister on the foot from swelling.  He has a palpable dorsalis pedis pulse.  The ulcer in its largest dimensions measures 4 x 4.5 cm.  There is increased pitting edema and venous swelling.  There is 75% fibrinous exudative tissue covering the wound.  Will apply Vashe dressing plus a Dynaflex wrap.    Imaging: No results found.   Labs: Lab Results  Component Value Date   HGBA1C 6.2 (H) 09/22/2023   HGBA1C 6.5 (H) 04/10/2023   HGBA1C 6.3 (H) 10/05/2022   ESRSEDRATE 34 (H) 01/15/2023   ESRSEDRATE 32 (H) 06/15/2022   ESRSEDRATE 40 (H) 06/14/2022   CRP 0.7 01/15/2023   CRP 0.8 06/15/2022   CRP <0.5 06/14/2022   LABURIC 7.8 05/20/2014   LABURIC 6.8 05/20/2013   REPTSTATUS 01/17/2023 FINAL 01/12/2023   GRAMSTAIN NO WBC SEEN NO ORGANISMS SEEN  01/12/2023   CULT  01/12/2023    No growth aerobically or anaerobically. Performed at [facility], [address], [place], [place] [address]    LABORGA PSEUDOMONAS AERUGINOSA 11/10/2022   LABORGA ENTEROCOCCUS FAECALIS 11/10/2022     Lab  Results  Component Value Date   ALBUMIN 3.9 09/21/2023   ALBUMIN 2.9 (L) 06/16/2022   ALBUMIN 3.8 06/14/2022   PREALBUMIN 18 06/14/2022    Lab Results  Component Value Date   MG 1.8 04/10/2023   MG 1.8 06/19/2022   MG 1.9 04/04/2022   Lab Results  Component Value Date   VD25OH 45 04/10/2023   VD25OH 57 10/05/2022   VD25OH 59 04/04/2022    Lab Results  Component Value Date   PREALBUMIN 18 06/14/2022      Latest Ref Rng & Units 09/21/2023    1:24 PM 04/10/2023    3:22 PM 01/15/2023    4:53 PM  CBC EXTENDED  WBC 4.0 - 10.5 K/uL 8.3  8.3  8.9   RBC 4.22 - 5.81 MIL/uL 3.84  3.65  3.55   Hemoglobin 13.0 - 17.0 g/dL 88.8  88.6  88.7   HCT 39.0 - 52.0 % 33.4  34.3  33.0   Platelets 150 - 400 K/uL 451  474  340   NEUT# 1,500 - 7,800 cells/uL  4,382  4.8   Lymph# 0.7 - 4.0 K/uL   2.3      There is no height or weight on file to calculate BMI.  Orders:  No orders of  the defined types were placed in this encounter.  No orders of the defined types were placed in this encounter.    Procedures: No procedures performed  Clinical Data: No additional findings.  ROS:  All other systems negative, except as noted in the HPI. Review of Systems  Objective: Vital Signs: There were no vitals taken for this visit.  Specialty Comments:  No specialty comments available.  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/16/2022   Osteomyelitis (HCC) 06/16/2022   Tenosynovitis of tibialis posterior tendon 06/15/2022   Wound infection 06/15/2022   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   Anemia 03/13/2019   Chronic venous  insufficiency 03/12/2019   CKD (chronic kidney disease) stage 2, 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/2015   COPD (chronic obstructive pulmonary disease) (HCC) 12/08/2015   Chronic pain syndrome 05/26/2015   DDD (degenerative disc disease), lumbar 05/26/2015   Medication management 05/26/2015   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   Lumbago with sciatica 08/06/2014   Hyperlipidemia 08/20/2013   Essential hypertension    Abnormal glucose    Vitamin D  deficiency    Arthritis    Vitamin B12 deficiency    Celiac disease 06/19/2012   GERD (gastroesophageal reflux disease) 04/30/2012   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 tube) procedure 04/30/2012   Hypothyroidism 04/30/2012   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   Past Medical History:  Diagnosis Date   Anemia    Celiac disease    diagnoses 06/24/12   Chronic ulcer of ankle (HCC)    left   Esophageal stricture    GERD (gastroesophageal reflux disease)    Hiatal hernia    Hypertension    patient states  no current HTN   Hypothyroidism    Pre-diabetes    Prediabetes    Shortness of breath    from oxycodone  at times   Vitamin B12 deficiency    Vitamin D  deficiency     Family History  Problem Relation Age of Onset   Esophageal cancer Father     Past Surgical History:  Procedure Laterality Date   APPENDECTOMY     APPLICATION OF WOUND VAC Left 01/12/2023   Procedure: APPLICATION OF WOUND VAC;  Surgeon: Harden Jerona GAILS, MD;  Location: [facility] OR;  Service: Orthopedics;  Laterality: Left;   BACK SURGERY     3 diff surgeries for vertebrea broken   EYE SURGERY     bilateral cataract surgery   HERNIA REPAIR  02/10/1949   bilateral groin as child   HIATAL HERNIA REPAIR     I & D EXTREMITY Left 06/16/2022   Procedure: IRRIGATION AND DEBRIDEMENT ANKLE;  Surgeon: Harden Jerona GAILS, MD;  Location: [facility] OR;  Service: Orthopedics;  Laterality: Left;   I & D EXTREMITY Left 07/21/2022   Procedure: LEFT ANKLE DEBRIDEMENT;  Surgeon: Harden Jerona GAILS, MD;   Location: [facility] OR;  Service: Orthopedics;  Laterality: Left;   I & D EXTREMITY Left 11/10/2022   Procedure: LEFT ANKLE DEBRIDEMENT;  Surgeon: Harden Jerona GAILS, MD;  Location: [facility] OR;  Service: Orthopedics;  Laterality: Left;   I & D EXTREMITY Left 01/12/2023   Procedure: LEFT ANKLE  DEBRIDEMENT;  Surgeon: Harden Jerona GAILS, MD;  Location: [facility] OR;  Service: Orthopedics;  Laterality: Left;   JOINT REPLACEMENT  04/12/2010   right knee replacement   TONSILLECTOMY     as child   TOTAL KNEE REVISION  11/20/2011   Procedure: TOTAL KNEE REVISION;  Surgeon: Donnice JONETTA Car, MD;  Location: [facility] ORS;  Service: Orthopedics;  Laterality: Right;  Right Total Knee Revision   Social History   Occupational History   Occupation: Retired    Associate Professor: [employer]  Tobacco Use   Smoking status: Former    Current packs/day: 0.00    Average packs/day: 1 pack/day for 20.0 years (20.0 ttl pk-yrs)    Types: Cigarettes    Start date: 06/27/1960    Quit date: 06/27/1980    Years since quitting: 43.4    Passive exposure: Past   Smokeless tobacco: Never  Vaping Use   Vaping status: Never Used  Substance and Sexual Activity   Alcohol use: No   Drug use: No   Sexual activity: Yes

## 2023-12-05 ENCOUNTER — Encounter (HOSPITAL_COMMUNITY): Admission: RE | Admit: 2023-12-05 | Source: Ambulatory Visit

## 2023-12-05 NOTE — Patient Instructions (Signed)
 SURGICAL WAITING ROOM VISITATION Patients having surgery or a procedure may have no more than 2 support people in the waiting area - these visitors may rotate.    Children under the age of 21 must have an adult with them who is not the patient.  If the patient needs to stay at the hospital during part of their recovery, the visitor guidelines for inpatient rooms apply. Pre-op nurse will coordinate an appropriate time for 1 support person to accompany patient in pre-op.  This support person may not rotate.    Please refer to the [facility] website for the visitor guidelines for Inpatients (after your surgery is over and you are in a regular room).       Your procedure is scheduled on: 12-11-23   Report to [facility] Main Entrance    Report to admitting at 10:15 AM   Call this number if you have problems the morning of surgery [phone]   Do not eat food or drink liquids :After Midnight.          If you have questions, please contact your surgeon's office.   FOLLOW  ANY ADDITIONAL PRE OP INSTRUCTIONS YOU RECEIVED FROM YOUR SURGEON'S OFFICE!!!     Oral Hygiene is also important to reduce your risk of infection.                                    Remember - BRUSH YOUR TEETH THE MORNING OF SURGERY WITH YOUR REGULAR TOOTHPASTE   Do NOT smoke after Midnight   Take these medicines the morning of surgery with A SIP OF WATER:    Hydroxyzine    Omeprazole   Stop all vitamins and herbal supplements 7 days before surgery  Bring CPAP mask and tubing day of surgery.                              You may not have any metal on your body including  jewelry, and body piercing             Do not wear lotions, powders, cologne, or deodorant              Men may shave face and neck.   Do not bring valuables to the hospital. [facility] IS NOT RESPONSIBLE   FOR VALUABLES.   Contacts, dentures or bridgework may not be worn into surgery.  DO NOT BRING YOUR HOME MEDICATIONS TO THE  HOSPITAL. PHARMACY WILL DISPENSE MEDICATIONS LISTED ON YOUR MEDICATION LIST TO YOU DURING YOUR ADMISSION IN THE HOSPITAL!    Patients discharged on the day of surgery will not be allowed to drive home.  Someone NEEDS to stay with you for the first 24 hours after anesthesia.   Special Instructions: Bring a copy of your healthcare power of attorney and living will documents the day of surgery if you haven't scanned them before.              Please read over the following fact sheets you were given: IF YOU HAVE QUESTIONS ABOUT YOUR PRE-OP INSTRUCTIONS PLEASE CALL [phone] [facility]  If you received a COVID test during your pre-op visit  it is requested that you wear a mask when out in public, stay away from anyone that may not be feeling well and notify your surgeon if you develop symptoms. If you test positive for Covid  or have been in contact with anyone that has tested positive in the last 10 days please notify you surgeon.  [facility] - Preparing for Surgery Before surgery, you can play an important role.  Because skin is not sterile, your skin needs to be as free of germs as possible.  You can reduce the number of germs on your skin by washing with CHG (chlorahexidine gluconate) soap before surgery.  CHG is an antiseptic cleaner which kills germs and bonds with the skin to continue killing germs even after washing. Please DO NOT use if you have an allergy to CHG or antibacterial soaps.  If your skin becomes reddened/irritated stop using the CHG and inform your nurse when you arrive at Short Stay. Do not shave (including legs and underarms) for at least 48 hours prior to the first CHG shower.  You may shave your face/neck.  Please follow these instructions carefully:  1.  Shower with CHG Soap the night before surgery and the  morning of surgery.  2.  If you choose to wash your hair, wash your hair first as usual with your normal  shampoo.  3.  After you shampoo, rinse your hair and body  thoroughly to remove the shampoo.                             4.  Use CHG as you would any other liquid soap.  You can apply chg directly to the skin and wash.  Gently with a scrungie or clean washcloth.  5.  Apply the CHG Soap to your body ONLY FROM THE NECK DOWN.   Do   not use on face/ open                           Wound or open sores. Avoid contact with eyes, ears mouth and   genitals (private parts).                       Wash face,  Genitals (private parts) with your normal soap.             6.  Wash thoroughly, paying special attention to the area where your    surgery  will be performed.  7.  Thoroughly rinse your body with warm water from the neck down.  8.  DO NOT shower/wash with your normal soap after using and rinsing off the CHG Soap.                9.  Pat yourself dry with a clean towel.            10.  Wear clean pajamas.            11.  Place clean sheets on your bed the night of your first shower and do not  sleep with pets. Day of Surgery : Do not apply any lotions/deodorants the morning of surgery.  Please wear clean clothes to the hospital/surgery center.  FAILURE TO FOLLOW THESE INSTRUCTIONS MAY RESULT IN THE CANCELLATION OF YOUR SURGERY  PATIENT SIGNATURE_________________________________  NURSE SIGNATURE__________________________________  ________________________________________________________________________

## 2023-12-05 NOTE — Progress Notes (Signed)
  Date of COVID positive in last 90 days:  No  PCP - No PCP, seeing Corean Ku on 12-17-23 Cardiologist - N/A  Chest x-ray - 09-21-23 Epic EKG - 09-25-23 Epic Stress Test - N/A ECHO - 09-23-23 Epic Cardiac Cath - N/A Pacemaker/ICD device last checked:N/A Spinal Cord Stimulator:N/A  Bowel Prep - N/A  Sleep Study - N/A CPAP -   Prediabetes  Fasting Blood Sugar - does not check  Checks Blood Sugar _____ times a day  Last dose of GLP1 agonist-  N/A GLP1 instructions:  Do not take after     Last dose of SGLT-2 inhibitors-  N/A SGLT-2 instructions:  Do not take after    Blood Thinner Instructions: N/A Aspirin  Instructions: Last Dose:  Activity level:  Can go up a flight of stairs and perform activities of daily living without stopping and without symptoms of chest pain.  Patient states that he has shortness of breath with exertion at times, has not worsened.    Anesthesia review: COPD, CKD Admitted in April for syncope and dizziness.  Patient denies any recent episodes.  Patient denies shortness of breath, fever, cough and chest pain at PAT appointment  Patient verbalized understanding of instructions that were given to them at the PAT appointment. Patient was also instructed that they will need to review over the PAT instructions again at home before surgery.

## 2023-12-06 ENCOUNTER — Other Ambulatory Visit: Payer: Self-pay

## 2023-12-06 ENCOUNTER — Encounter (HOSPITAL_COMMUNITY)
Admission: RE | Admit: 2023-12-06 | Discharge: 2023-12-06 | Disposition: A | Discharge status: Home / Self Care | Source: Ambulatory Visit | Attending: Urology | Admitting: Urology

## 2023-12-06 ENCOUNTER — Encounter (HOSPITAL_COMMUNITY): Payer: Self-pay

## 2023-12-06 VITALS — BP 144/78 | HR 70 | Temp 98.3°F | Resp 16 | Ht 67.0 in | Wt 160.0 lb

## 2023-12-06 DIAGNOSIS — Z87891 Personal history of nicotine dependence: Secondary | ICD-10-CM | POA: Diagnosis not present

## 2023-12-06 DIAGNOSIS — J449 Chronic obstructive pulmonary disease, unspecified: Secondary | ICD-10-CM | POA: Insufficient documentation

## 2023-12-06 DIAGNOSIS — K219 Gastro-esophageal reflux disease without esophagitis: Secondary | ICD-10-CM | POA: Diagnosis not present

## 2023-12-06 DIAGNOSIS — I10 Essential (primary) hypertension: Secondary | ICD-10-CM | POA: Insufficient documentation

## 2023-12-06 DIAGNOSIS — D649 Anemia, unspecified: Secondary | ICD-10-CM | POA: Diagnosis not present

## 2023-12-06 DIAGNOSIS — Z01812 Encounter for preprocedural laboratory examination: Secondary | ICD-10-CM | POA: Diagnosis not present

## 2023-12-06 DIAGNOSIS — C679 Malignant neoplasm of bladder, unspecified: Secondary | ICD-10-CM | POA: Insufficient documentation

## 2023-12-06 DIAGNOSIS — E039 Hypothyroidism, unspecified: Secondary | ICD-10-CM | POA: Insufficient documentation

## 2023-12-06 HISTORY — DX: Chronic obstructive pulmonary disease, unspecified: J44.9

## 2023-12-06 LAB — CBC
HCT: 33.3 % — ABNORMAL LOW (ref 39.0–52.0)
Hemoglobin: 10.6 g/dL — ABNORMAL LOW (ref 13.0–17.0)
MCH: 29.9 pg (ref 26.0–34.0)
MCHC: 31.8 g/dL (ref 30.0–36.0)
MCV: 94.1 fL (ref 80.0–100.0)
Platelets: 541 10*3/uL — ABNORMAL HIGH (ref 150–400)
RBC: 3.54 MIL/uL — ABNORMAL LOW (ref 4.22–5.81)
RDW: 18.5 % — ABNORMAL HIGH (ref 11.5–15.5)
WBC: 7.6 10*3/uL (ref 4.0–10.5)
nRBC: 0 % (ref 0.0–0.2)

## 2023-12-06 LAB — BASIC METABOLIC PANEL WITH GFR
Anion gap: 9 (ref 5–15)
BUN: 22 mg/dL (ref 8–23)
CO2: 25 mmol/L (ref 22–32)
Calcium: 8.9 mg/dL (ref 8.9–10.3)
Chloride: 103 mmol/L (ref 98–111)
Creatinine, Ser: 0.9 mg/dL (ref 0.61–1.24)
GFR, Estimated: >60 mL/min (ref >60)
Glucose, Bld: 114 mg/dL — ABNORMAL HIGH (ref 70–99)
Potassium: 4.7 mmol/L (ref 3.5–5.1)
Sodium: 137 mmol/L (ref 135–145)

## 2023-12-07 NOTE — Progress Notes (Signed)
 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 former smoker with h/o HTN, GERD-s/p Nissen's fundoplication, hypothyroidism, COPD, bladder neoplasm scheduled for above procedure 12/11/2023 with Dr. Steffan Shane.   Pt with admission 09/21/2023-09/23/2023 with episode of syncope contributed to poor oral intake and ongoing use of diuretics.  Echo normal. Sx improved with IV fluids.  VS: BP (!) 144/78   Pulse 70   Temp 36.8 C (Oral)   Resp 16   Ht 5' 7 (1.702 m)   Wt 72.6 kg   SpO2 100%   BMI 25.06 kg/m   PROVIDERS: Patient, No Pcp Per   LABS: Labs reviewed: Acceptable for surgery. (all labs ordered are listed, but only abnormal results are displayed)  Labs Reviewed  BASIC METABOLIC PANEL WITH GFR - Abnormal; Notable for the following components:      Result Value   Glucose, Bld 114 (*)    All other components within normal limits  CBC - Abnormal; Notable for the following components:   RBC 3.54 (*)    Hemoglobin 10.6 (*)    HCT 33.3 (*)    RDW 18.5 (*)    Platelets 541 (*)    All other components within normal limits     IMAGES:   EKG:   CV: Echo 09/23/2023  1. Left ventricular ejection fraction, by estimation, is 60 to 65%. The  left ventricle has normal function. The left ventricle has no regional  wall motion abnormalities. Left ventricular diastolic parameters are  consistent with Grade I diastolic  dysfunction (impaired relaxation).   2. Right ventricular systolic function is normal. The right ventricular  size is normal.   3. Left atrial size was mildly dilated.   4.  The mitral valve is normal in structure. Mild mitral valve  regurgitation. No evidence of mitral stenosis.   5. The aortic valve is tricuspid. Aortic valve regurgitation is not  visualized. No aortic stenosis is present.   6. Aortic dilatation noted. There is borderline dilatation of the aortic  arch, measuring 31 mm.   7. The inferior vena cava is normal in size with greater than 50%  respiratory variability, suggesting right atrial pressure of 3 mmHg.   Comparison(s): No prior Echocardiogram.  Past Medical History:  Diagnosis Date   Anemia    Celiac disease    diagnoses 06/24/12   Chronic ulcer of ankle (HCC)    left   COPD (chronic obstructive pulmonary disease) (HCC)    Esophageal stricture    GERD (gastroesophageal reflux disease)    Hiatal hernia    Hypertension    patient states  no current HTN   Hypothyroidism    Pre-diabetes    Prediabetes    Shortness of breath    from oxycodone  at times   Vitamin B12 deficiency    Vitamin D  deficiency     Past Surgical History:  Procedure Laterality Date   APPENDECTOMY     APPLICATION OF WOUND VAC Left 01/12/2023   Procedure: APPLICATION OF WOUND VAC;  Surgeon: Harden Jerona GAILS, MD;  Location: [facility] OR;  Service: Orthopedics;  Laterality: Left;   BACK SURGERY     3 diff surgeries for vertebrea broken   EYE SURGERY     bilateral cataract surgery   HERNIA REPAIR  02/10/1949   bilateral groin as child   HIATAL HERNIA REPAIR     I & D EXTREMITY Left 06/16/2022   Procedure: IRRIGATION AND DEBRIDEMENT ANKLE;  Surgeon: Harden Jerona GAILS, MD;  Location: [facility] OR;  Service: Orthopedics;  Laterality: Left;   I & D EXTREMITY Left 07/21/2022   Procedure: LEFT ANKLE DEBRIDEMENT;  Surgeon: Harden Jerona GAILS, MD;  Location: [facility] OR;  Service: Orthopedics;  Laterality: Left;   I & D EXTREMITY Left 11/10/2022   Procedure: LEFT ANKLE DEBRIDEMENT;  Surgeon: Harden Jerona GAILS, MD;  Location: [facility] OR;  Service: Orthopedics;  Laterality: Left;   I & D EXTREMITY Left  01/12/2023   Procedure: LEFT ANKLE DEBRIDEMENT;  Surgeon: Harden Jerona GAILS, MD;  Location: [facility] OR;  Service: Orthopedics;  Laterality: Left;   JOINT REPLACEMENT  04/12/2010   right knee replacement   TONSILLECTOMY     as child   TOTAL KNEE REVISION  11/20/2011   Procedure: TOTAL KNEE REVISION;  Surgeon: Donnice JONETTA Car, MD;  Location: [facility]S;  Service: Orthopedics;  Laterality: Right;  Right Total Knee Revision    MEDICATIONS:  Ascorbic Acid (VITAMIN C) 1000 MG tablet   betamethasone  dipropionate 0.05 % cream   diphenhydrAMINE  (BENADRYL ) 25 MG tablet   doxepin  (SINEQUAN ) 10 MG capsule   hydrOXYzine  (ATARAX ) 25 MG tablet   MAGNESIUM  PO   MELATONIN PO   omeprazole  (PRILOSEC  OTC) 20 MG tablet   torsemide  (DEMADEX ) 20 MG tablet   triamcinolone  cream (KENALOG ) 0.1 %   Wound Cleansers (VASHE WOUND EX)   zinc  gluconate 50 MG tablet   No current facility-administered medications for this encounter.    Harlene Hoots Ward, PA-C [facility]-Surgical [facility] [phone]

## 2023-12-09 NOTE — H&P (Signed)
 83 year old male referred for CT finding of irregular mass in the bladder on CT. He has had an unintentional weight loss of 30 pounds. He has had no hematuria or bladder pain. He does have a 20 pack smoking year history.   PMH: Celiac disease, GERD, hypertension, hypothyroidism, diabetes, No COBD, Asthmoa or OSA, no Cards hx, no blood thinners. Does not see cardiologist.  PSH: Appendectomy, back surgery, hernia repair, hiatal hernia repair, I&D, joint replacement, tonsillectomy.   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 quit 40 years ago, no occupational exposure. had GH 4 months ago.  12/11/23:TURBT today     ALLERGIES: Doxycycline  Feraheme  Latex Levothyroxine  Sulfa  Antibiotics    MEDICATIONS: Omeprazole   Betamethasone   Clotrimazole -Betamethasone   Gabapentin   hydrOXYzine  HCl  Naproxen  oxyCODONE  HCl  Senna-Docusate Sodium   Torsemide      GU PSH: No GU PSH    NON-GU PSH: No Non-GU PSH    GU PMH: None   NON-GU PMH: GERD    FAMILY HISTORY: No Family History    SOCIAL HISTORY: Marital Status: Married Preferred Language: English; Ethnicity: Not Hispanic Or Latino; Race: White Current Smoking Status: Patient does not smoke anymore. Has not smoked since 11/11/1978.   Tobacco Use Assessment Completed: Used Tobacco in last 30 days? Does not use smokeless tobacco. Has never drank.  Does not use drugs. Drinks 3 caffeinated drinks per day. Has not had a blood transfusion.    REVIEW OF SYSTEMS:    GU Review Male:   Patient reports get up at night to urinate and erection problems. Patient denies frequent urination, hard to postpone urination, burning/ pain with urination, leakage of urine, stream starts and stops, trouble starting your stream, have to strain to urinate , and penile pain.  Gastrointestinal (Upper):   Patient reports indigestion/ heartburn. Patient denies nausea and vomiting.  Gastrointestinal (Lower):   Patient  denies diarrhea and constipation.  Constitutional:   Patient reports weight loss. Patient denies fever, night sweats, and fatigue.  Skin:   Patient reports skin rash/ lesion and itching.   Eyes:   Patient denies blurred vision and double vision.  Ears/ Nose/ Throat:   Patient denies sore throat and sinus problems.  Hematologic/Lymphatic:   Patient denies swollen glands and easy bruising.  Cardiovascular:   Patient reports leg swelling. Patient denies chest pains.  Respiratory:   Patient denies cough and shortness of breath.  Endocrine:   Patient denies excessive thirst.  Musculoskeletal:   Patient reports back pain and joint pain.   Neurological:   Patient denies headaches and dizziness.  Psychologic:   Patient denies depression and anxiety.   Notes: weak stream    VITAL SIGNS:      11/21/2023 01:41 PM  BP 136/62 mmHg  Pulse 72 /min  Temperature 97.7 F / 36.5 C   MULTI-SYSTEM PHYSICAL EXAMINATION:    Constitutional: Well-nourished. No physical deformities. Normally developed. Good grooming.  Respiratory: No labored breathing, no use of accessory muscles.   Cardiovascular: Normal temperature, normal extremity pulses, no swelling, no varicosities.  Neurologic / Psychiatric: Oriented to time, oriented to place, oriented to person. No depression, no anxiety, no agitation.  Musculoskeletal: Normal gait and station of head and neck.     Complexity of Data:  Source Of History:  Patient  Records Review:   Previous Patient Records  Urine Test Review:   Urinalysis  X-Ray Review: C.T. Abdomen/Pelvis: Reviewed Films. Reviewed Report. Discussed With Patient. CT demonstrates a [employer]  diverticulum on the left side of the bladder with an enhancing mass on the rim of the [place] diverticulum there is no hydronephrosis bilaterally. No other abnormalities in the bladder no lymphadenopathy no renal masses bilaterally no adrenal masses    PROCEDURES:          Visit Complexity - G2211           Urinalysis w/Scope - 81001 Dipstick Dipstick Cont'd Micro  Color: Yellow Bilirubin: Neg WBC/hpf: 0 - 5/hpf  Appearance: Slightly Cloudy Ketones: Neg RBC/hpf: 0 - 2/hpf  Specific Gravity: 1.025 Blood: Neg Bacteria: Rare (0-9/hpf)  pH: 6.0 Protein: Neg Cystals: NS (Not Seen)  Glucose: Neg Urobilinogen: 0.2 Casts: NS (Not Seen)    Nitrites: Neg Trichomonas: Not Present    Leukocyte Esterase: Neg Mucous: Not Present      Epithelial Cells: 0 - 5/hpf      Yeast: NS (Not Seen)      Sperm: Not Present    Notes:      ASSESSMENT:      ICD-10 Details  1 GU:   Bladder tumor/neoplasm - D41.4 Undiagnosed New Problem   PLAN:           Orders Labs Urine Culture, CBC with Diff, BMP, Hgb-A1c          Document Letter(s):  Created for Patient: Clinical Summary         Notes:   Bladder tumor: CT demonstrates bladder tumor but no other irregularities on CT. Based on imaging I am comfortable with the patient the OR for TURBT for concerns for bladder cancer. Plan for TURBT today.  Ucx prior negative.  We discussed risk benefits alternatives to transurethral resection of bladder tumor. Benefits are diagnostic and therapeutic which include removal of bladder tumor and relieve the possible irritative symptoms from bladder tumor. We discussed risk including not being able to remove all of the tumor, possibility of perforation of the bladder requiring long-term catheter or operative intervention to repair. The need for catheter postoperatively was also discussed, as well as postoperative lower urinary tract symptoms in the immediate postop period. Patient voiced their understanding and would like to proceed with the surgery.

## 2023-12-11 ENCOUNTER — Ambulatory Visit (HOSPITAL_COMMUNITY): Payer: Self-pay | Admitting: Physician Assistant

## 2023-12-11 ENCOUNTER — Encounter (HOSPITAL_COMMUNITY): Payer: Self-pay | Admitting: Urology

## 2023-12-11 ENCOUNTER — Ambulatory Visit (HOSPITAL_COMMUNITY): Admitting: Certified Registered Nurse Anesthetist

## 2023-12-11 ENCOUNTER — Ambulatory Visit (HOSPITAL_COMMUNITY)
Admission: RE | Admit: 2023-12-11 | Discharge: 2023-12-11 | Disposition: A | Discharge status: Home / Self Care | Source: Ambulatory Visit | Attending: Urology | Admitting: Urology

## 2023-12-11 ENCOUNTER — Ambulatory Visit (HOSPITAL_COMMUNITY)

## 2023-12-11 ENCOUNTER — Ambulatory Visit: Admitting: Orthopedic Surgery

## 2023-12-11 ENCOUNTER — Encounter (HOSPITAL_COMMUNITY)
Admission: RE | Disposition: A | Discharge status: Home / Self Care | Payer: Self-pay | Source: Ambulatory Visit | Attending: Urology

## 2023-12-11 DIAGNOSIS — J449 Chronic obstructive pulmonary disease, unspecified: Secondary | ICD-10-CM | POA: Diagnosis not present

## 2023-12-11 DIAGNOSIS — Z79899 Other long term (current) drug therapy: Secondary | ICD-10-CM | POA: Insufficient documentation

## 2023-12-11 DIAGNOSIS — C679 Malignant neoplasm of bladder, unspecified: Secondary | ICD-10-CM | POA: Insufficient documentation

## 2023-12-11 DIAGNOSIS — Z6825 Body mass index (BMI) 25.0-25.9, adult: Secondary | ICD-10-CM | POA: Diagnosis not present

## 2023-12-11 DIAGNOSIS — R0602 Shortness of breath: Secondary | ICD-10-CM | POA: Insufficient documentation

## 2023-12-11 DIAGNOSIS — C672 Malignant neoplasm of lateral wall of bladder: Secondary | ICD-10-CM | POA: Diagnosis not present

## 2023-12-11 DIAGNOSIS — R634 Abnormal weight loss: Secondary | ICD-10-CM | POA: Insufficient documentation

## 2023-12-11 DIAGNOSIS — I129 Hypertensive chronic kidney disease with stage 1 through stage 4 chronic kidney disease, or unspecified chronic kidney disease: Secondary | ICD-10-CM | POA: Insufficient documentation

## 2023-12-11 DIAGNOSIS — K449 Diaphragmatic hernia without obstruction or gangrene: Secondary | ICD-10-CM | POA: Insufficient documentation

## 2023-12-11 DIAGNOSIS — D649 Anemia, unspecified: Secondary | ICD-10-CM | POA: Insufficient documentation

## 2023-12-11 DIAGNOSIS — D494 Neoplasm of unspecified behavior of bladder: Secondary | ICD-10-CM

## 2023-12-11 DIAGNOSIS — Z87891 Personal history of nicotine dependence: Secondary | ICD-10-CM | POA: Insufficient documentation

## 2023-12-11 DIAGNOSIS — E1122 Type 2 diabetes mellitus with diabetic chronic kidney disease: Secondary | ICD-10-CM | POA: Insufficient documentation

## 2023-12-11 DIAGNOSIS — N35912 Unspecified bulbous urethral stricture, male: Secondary | ICD-10-CM | POA: Insufficient documentation

## 2023-12-11 DIAGNOSIS — K219 Gastro-esophageal reflux disease without esophagitis: Secondary | ICD-10-CM | POA: Diagnosis not present

## 2023-12-11 DIAGNOSIS — N189 Chronic kidney disease, unspecified: Secondary | ICD-10-CM | POA: Diagnosis not present

## 2023-12-11 DIAGNOSIS — M1990 Unspecified osteoarthritis, unspecified site: Secondary | ICD-10-CM | POA: Insufficient documentation

## 2023-12-11 DIAGNOSIS — D090 Carcinoma in situ of bladder: Secondary | ICD-10-CM | POA: Diagnosis not present

## 2023-12-11 DIAGNOSIS — I340 Nonrheumatic mitral (valve) insufficiency: Secondary | ICD-10-CM | POA: Insufficient documentation

## 2023-12-11 DIAGNOSIS — E039 Hypothyroidism, unspecified: Secondary | ICD-10-CM | POA: Insufficient documentation

## 2023-12-11 HISTORY — PX: CYSTOSCOPY W/ RETROGRADES: SHX1426

## 2023-12-11 SURGERY — TURBT, WITH CHEMOTHERAPEUTIC AGENT INSTILLATION INTO BLADDER
Anesthesia: General | Site: Ureter

## 2023-12-11 MED ORDER — HYOSCYAMINE SULFATE 0.125 MG PO TBDP: 0.1250 mg | ORAL_TABLET | Freq: Four times a day (QID) | ORAL | 0 refills | Status: DC | PRN

## 2023-12-11 MED ORDER — POLYETHYLENE GLYCOL 3350 17 G PO PACK: 17.0000 g | PACK | Freq: Every day | ORAL | 0 refills | Status: DC

## 2023-12-11 MED ORDER — PROPOFOL 10 MG/ML IV BOLUS
INTRAVENOUS | Status: DC | PRN
  Administered 2023-12-11: 110 mg via INTRAVENOUS

## 2023-12-11 MED ORDER — ACETAMINOPHEN 500 MG PO TABS
ORAL_TABLET | ORAL | Status: AC
  Filled 2023-12-11: qty 1

## 2023-12-11 MED ORDER — PHENYLEPHRINE 80 MCG/ML (10ML) SYRINGE FOR IV PUSH (FOR BLOOD PRESSURE SUPPORT)
PREFILLED_SYRINGE | INTRAVENOUS | Status: DC | PRN
  Administered 2023-12-11: 80 ug via INTRAVENOUS

## 2023-12-11 MED ORDER — PROPOFOL 10 MG/ML IV BOLUS
INTRAVENOUS | Status: AC
  Filled 2023-12-11: qty 20

## 2023-12-11 MED ORDER — DEXAMETHASONE SODIUM PHOSPHATE 10 MG/ML IJ SOLN
INTRAMUSCULAR | Status: DC | PRN
  Administered 2023-12-11: 5 mg via INTRAVENOUS

## 2023-12-11 MED ORDER — LACTATED RINGERS IV SOLN: INTRAVENOUS | Status: DC

## 2023-12-11 MED ORDER — LIDOCAINE HCL (PF) 2 % IJ SOLN
INTRAMUSCULAR | Status: DC | PRN
  Administered 2023-12-11: 7 mg

## 2023-12-11 MED ORDER — FENTANYL CITRATE (PF) 250 MCG/5ML IJ SOLN
INTRAMUSCULAR | Status: DC | PRN
  Administered 2023-12-11 (×2): 25 ug via INTRAVENOUS
  Administered 2023-12-11: 50 ug via INTRAVENOUS

## 2023-12-11 MED ORDER — ORAL CARE MOUTH RINSE: 15.0000 mL | Freq: Once | OROMUCOSAL | Status: AC

## 2023-12-11 MED ORDER — ONDANSETRON HCL 4 MG/2ML IJ SOLN
INTRAMUSCULAR | Status: DC | PRN
  Administered 2023-12-11: 4 mg via INTRAVENOUS

## 2023-12-11 MED ORDER — CHLORHEXIDINE GLUCONATE 0.12 % MT SOLN
15.0000 mL | Freq: Once | OROMUCOSAL | Status: AC
  Administered 2023-12-11: 15 mL via OROMUCOSAL

## 2023-12-11 MED ORDER — IOHEXOL 300 MG/ML  SOLN
INTRAMUSCULAR | Status: DC | PRN
Start: 2023-12-11 — End: 2023-12-11
  Administered 2023-12-11: 10 mL via URETHRAL

## 2023-12-11 MED ORDER — ACETAMINOPHEN 500 MG PO TABS
1000.0000 mg | ORAL_TABLET | Freq: Once | ORAL | Status: AC
  Administered 2023-12-11: 1000 mg via ORAL

## 2023-12-11 MED ORDER — FENTANYL CITRATE PF 50 MCG/ML IJ SOSY: 25.0000 ug | PREFILLED_SYRINGE | INTRAMUSCULAR | Status: DC | PRN

## 2023-12-11 MED ORDER — METHOCARBAMOL 750 MG PO TABS: 750.0000 mg | ORAL_TABLET | Freq: Four times a day (QID) | ORAL | 0 refills | Status: AC

## 2023-12-11 MED ORDER — SUGAMMADEX SODIUM 200 MG/2ML IV SOLN
INTRAVENOUS | Status: DC | PRN
  Administered 2023-12-11: 200 mg via INTRAVENOUS

## 2023-12-11 MED ORDER — DROPERIDOL 2.5 MG/ML IJ SOLN: 0.6250 mg | Freq: Once | INTRAMUSCULAR | Status: DC | PRN

## 2023-12-11 MED ORDER — ROCURONIUM BROMIDE 10 MG/ML (PF) SYRINGE
PREFILLED_SYRINGE | INTRAVENOUS | Status: DC | PRN
  Administered 2023-12-11: 10 mg via INTRAVENOUS
  Administered 2023-12-11: 50 mg via INTRAVENOUS

## 2023-12-11 MED ORDER — SODIUM CHLORIDE 0.9 % IR SOLN
Status: DC | PRN
  Administered 2023-12-11: 6000 mL

## 2023-12-11 MED ORDER — FENTANYL CITRATE (PF) 100 MCG/2ML IJ SOLN
INTRAMUSCULAR | Status: AC
  Filled 2023-12-11: qty 2

## 2023-12-11 MED ORDER — SODIUM CHLORIDE 0.9 % IV SOLN
1.0000 g | INTRAVENOUS | Status: AC
  Administered 2023-12-11: 2 g via INTRAVENOUS
  Filled 2023-12-11: qty 10

## 2023-12-11 SURGICAL SUPPLY — 21 items
BAG COUNTER SPONGE SURGICOUNT (BAG) IMPLANT
BAG URO CATCHER STRL LF (MISCELLANEOUS) ×2 IMPLANT
BALLOON NEPHROSTOMY (BALLOONS) IMPLANT
CATH FOLEY 2W COUNCIL 5CC 18FR (CATHETERS) IMPLANT
CATH SILICON 18FR 30CC (CATHETERS) IMPLANT
CATH URETL OPEN 5X70 (CATHETERS) IMPLANT
CLOTH BEACON ORANGE TIMEOUT ST (SAFETY) ×2 IMPLANT
GLOVE BIO SURGEON STRL SZ8 (GLOVE) ×2 IMPLANT
GOWN STRL REUS W/ TWL XL LVL3 (GOWN DISPOSABLE) ×2 IMPLANT
GUIDEWIRE STR DUAL SENSOR (WIRE) ×2 IMPLANT
KIT TURNOVER KIT A (KITS) ×2 IMPLANT
LASER FIB FLEXIVA PULSE ID 365 (Laser) IMPLANT
LASER FIB FLEXIVA PULSE ID 550 (Laser) IMPLANT
LASER FIB FLEXIVA PULSE ID 910 (Laser) IMPLANT
MANIFOLD NEPTUNE II (INSTRUMENTS) ×2 IMPLANT
NS IRRIG 1000ML POUR BTL (IV SOLUTION) IMPLANT
PACK CYSTO (CUSTOM PROCEDURE TRAY) ×2 IMPLANT
STENT URET 6FRX24 CONTOUR (STENTS) IMPLANT
SYRINGE TOOMEY IRRIG 70ML (MISCELLANEOUS) IMPLANT
TRACTIP FLEXIVA PULS ID 200XHI (Laser) IMPLANT
TUBING CONNECTING 10 (TUBING) ×2 IMPLANT

## 2023-12-11 NOTE — Discharge Instructions (Signed)
 Transurethral Resection of Bladder Tumor (TURBT) or Bladder Biopsy   Definition:  Transurethral Resection of the Bladder Tumor is a surgical procedure used to diagnose and remove tumors within the bladder. TURBT is the most common treatment for early stage bladder cancer.  General instructions:     Your recent bladder surgery requires very little post hospital care but some definite precautions.  Despite the fact that no skin incisions were used, the area around the bladder incisions are raw and covered with scabs to promote healing and prevent bleeding. Certain precautions are needed to insure that the scabs are not disturbed over the next 2-4 weeks while the healing proceeds.  Because the raw surface inside your bladder and the irritating effects of urine you may expect frequency of urination and/or urgency (a stronger desire to urinate) and perhaps even getting up at night more often. This will usually resolve or improve slowly over the healing period. You may see some blood in your urine over the first 6 weeks. Do not be alarmed, even if the urine was clear for a while. Get off your feet and drink lots of fluids until clearing occurs. If you start to pass clots or don't improve call us.  Catheter: Due to the nature of this surgery some patients need to be discharged with a catheter. If you are discharged with a catheter instructions on how to care for the catheter will be attached to your discharge paperwork. You will be called to schedule an appointment to remove the catheter.   Diet:  You may return to your normal diet immediately. Because of the raw surface of your bladder, alcohol, spicy foods, foods high in acid and drinks with caffeine may cause irritation or frequency and should be used in moderation. To keep your urine flowing freely and avoid constipation, drink plenty of fluids during the day (8-10 glasses). Tip: Avoid cranberry juice because it is very acidic.  Activity:  Do not  lift more than 10 LBS for 2 weeks Do not strain with bowel movents.  Your physical activity doesn't need to be restricted. However, if you are very active, you may see some blood in the urine. We suggest that you reduce your activity under the circumstances until the bleeding has stopped.  Bowels:  It is important to keep your bowels regular during the postoperative period. Straining with bowel movements can cause bleeding. A bowel movement every other day is reasonable. Use a mild laxative if needed, such as milk of magnesia 2-3 tablespoons, or 2 Dulcolax tablets. Call if you continue to have problems. If you had been taking narcotics for pain, before, during or after your surgery, you may be constipated. Take a laxative if necessary.    Medication:  You should resume your pre-surgery medications unless told not to. In addition you may be given an antibiotic to prevent or treat infection. Antibiotics are not always necessary. All medication should be taken as prescribed until the bottles are finished unless you are having an unusual reaction to one of the drugs.     Foley Catheter Care A soft, flexible tube (Foley catheter) may have been placed in your bladder to drain urine and fluid. Follow these instructions: Taking Care of the Catheter Keep the area where the catheter leaves your body clean.  Attach the catheter to the leg so there is no tension on the catheter.  Keep the drainage bag below the level of the bladder, but keep it OFF the floor.  Do not  take long soaking baths. Your caregiver will give instructions about showering.  Wash your hands before touching ANYTHING related to the catheter or bag.  Using mild soap and warm water on a washcloth:  Clean the area closest to the catheter insertion site using a circular motion around the catheter.  Clean the catheter itself by wiping AWAY from the insertion site for several inches down the tube.  NEVER wipe upward as this could sweep  bacteria up into the urethra (tube in your body that normally drains the bladder) and cause infection.  Place a small amount of sterile lubricant at the tip of the penis where the catheter is entering.  Taking Care of the Drainage Bags Two drainage bags may be taken home: a large overnight drainage bag, and a smaller leg bag which fits underneath clothing.  It is okay to wear the overnight bag at any time, but NEVER wear the smaller leg bag at night.  Keep the drainage bag well below the level of your bladder. This prevents backflow of urine into the bladder and allows the urine to drain freely.  Anchor the tubing to your leg to prevent pulling or tension on the catheter. Use tape or a leg strap provided by the hospital.  Empty the drainage bag when it is 1/2 to 3/4 full. Wash your hands before and after touching the bag.  Periodically check the tubing for kinks to make sure there is no pressure on the tubing which could restrict the flow of urine.  Changing the Drainage Bags Cleanse both ends of the clean bag with alcohol before changing.  Pinch off the rubber catheter to avoid urine spillage during the disconnection.  Disconnect the dirty bag and connect the clean one.  Empty the dirty bag carefully to avoid a urine spill.  Attach the new bag to the leg with tape or a leg strap.  Cleaning the Drainage Bags Whenever a drainage bag is disconnected, it must be cleaned quickly so it is ready for the next use.  Wash the bag in warm, soapy water.  Rinse the bag thoroughly with warm water.  Soak the bag for 30 minutes in a solution of white vinegar and water (1 cup vinegar to 1 quart warm water).  Rinse with warm water.   IT Normal To See Some blood in the urine is normal, as long as you can see your fingers through the catheter tubing (not the bag) this is ok. As long as the urine is not the consistency of tomato paste.  It will be normal to feel the urge to urinate often this is a side effect of  the catheter  Some discharge around the catheter where it exits the penis is normal  SEEK MEDICAL CARE IF:  You have chills or night sweats.  You are leaking around your catheter or have problems with your catheter. It is not uncommon to have sporadic leakage around your catheter as a result of bladder spasms. If the leakage stops, there is not much need for concern. If you are uncertain, call your caregiver.  You develop side effects that you think are coming from your medicines.  SEEK IMMEDIATE MEDICAL CARE IF:  You are suddenly unable to urinate. Check to see if there are any kinks in the drainage tubing that may cause this. If you cannot find any kinks, call your caregiver immediately. This is an emergency.  You develop shortness of breath or chest pains.  Bleeding persists or clots develop  in your urine.  You have a fever.  You develop pain in your back or over your lower belly (abdomen).  You develop pain or swelling in your legs.  Any problems you are having get worse rather than better.  MAKE SURE YOU:  Understand these instructions.  Will watch your condition.  Will get help right away if you are not doing well or get worse.

## 2023-12-11 NOTE — Anesthesia Preprocedure Evaluation (Addendum)
 Anesthesia Evaluation  Patient identified by MRN, date of birth, ID band Patient awake    Reviewed: Allergy & Precautions, H&P , NPO status , Patient's Chart, lab work & pertinent test results  Airway Mallampati: II  TM Distance: >3 FB Neck ROM: Full    Dental no notable dental hx. (+) Dental Advisory Given, Teeth Intact   Pulmonary shortness of breath, COPD, former smoker   Pulmonary exam normal breath sounds clear to auscultation       Cardiovascular hypertension, Pt. on medications Normal cardiovascular exam+ Valvular Problems/Murmurs MR  Rhythm:Regular Rate:Normal  Echo 09/2023  1. Left ventricular ejection fraction, by estimation, is 60 to 65%. The left ventricle has normal function. The left ventricle has no regional wall motion abnormalities. Left ventricular diastolic parameters are consistent with Grade I diastolic dysfunction (impaired relaxation).   2. Right ventricular systolic function is normal. The right ventricular size is normal.   3. Left atrial size was mildly dilated.   4. The mitral valve is normal in structure. Mild mitral valve regurgitation. No evidence of mitral stenosis.   5. The aortic valve is tricuspid. Aortic valve regurgitation is not visualized. No aortic stenosis is present.   6. Aortic dilatation noted. There is borderline dilatation of the aortic arch, measuring 31mm.   7. The inferior vena cava is normal in size with greater than 50% respiratory variability, suggesting right atrial pressure of 3 mmHg.   Comparison(s): No prior Echocardiogram.     Neuro/Psych  Neuromuscular disease  negative psych ROS   GI/Hepatic Neg liver ROS, hiatal hernia,GERD  Medicated and Controlled,,  Endo/Other  diabetesHypothyroidism    Renal/GU Renal InsufficiencyRenal disease     Musculoskeletal  (+) Arthritis ,    Abdominal   Peds  Hematology  (+) Blood dyscrasia, anemia   Anesthesia Other Findings    Reproductive/Obstetrics                              Anesthesia Physical Anesthesia Plan  ASA: 2  Anesthesia Plan: General   Post-op Pain Management: Minimal or no pain anticipated and Tylenol  PO (pre-op)*   Induction: Intravenous  PONV Risk Score and Plan: 3 and Ondansetron , Treatment may vary due to age or medical condition and Dexamethasone   Airway Management Planned: Oral ETT  Additional Equipment:   Intra-op Plan:   Post-operative Plan: Extubation in OR  Informed Consent:   Plan Discussed with:   Anesthesia Plan Comments:         Anesthesia Quick Evaluation

## 2023-12-11 NOTE — Op Note (Signed)
 Operative Note  Preoperative diagnosis:  1.  Bladder tumor  Postoperative diagnosis: 1.  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  Tumor surrounding right ureteral orifice 3.  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  3 cm TURBT medium 2.  Urethral dilation 3.  Retrograde pyelogram 4.  Right ureteral stent placement  Surgeon: Shane Sensing MD  Assistants:  None  Anesthesia:  General  Complications:  None  EBL: Minimal  Specimens: 1.  Left bladder tumor 2.  Tumor proximal to the right ureteral orifice  Drains/Catheters: 1.  18 French council catheter 10 cc balloon 2.  6 x 24 double-J stent no strings right ureter  Intraoperative findings:   2 tumors 1 at the left bladder wall involving a diverticulum Second tumor involving tissue surrounding right ureteral orifice Slight meatal stricture dilated to 30 [place] using [employer]. Bulbar urethral stricture dilated to 24 [place] using UroMax balloon Right-year-old this required significant resection 6 x 24 double-J stent placed in the right ureter due to resection.  Large tumor near left Number of tumors:          2, Size of largest tumor:  3 cm    Characteristics of tumors:     Papillary    Recurrent no       Primary   yes  Suspicious for Carcinoma in situ:    no   Clinical tumor stage:   unkown  Bimanual exam under anesthesia:        no  Visually complete resection:                yes  Visualization of detrusor muscle in resection base:      yes  Visual evaluation for perforation:             Evidence of perforation at the connection point of the diverticulum   Indication:  [name] Fernandez is a 83 y.o. male with visualized tumor near diverticulum all the risks, benefits were discussed with the patient to include but not limited to infection, pain, bleeding, damage to adjacent structures, need for further operations, adverse reaction to  anesthesia and death.  Patient understands these risks and agrees to proceed with the operation as planned.    Description of procedure: After informed consent was obtained from the patient, the patient was taken to the operating room. General anesthesia was administered. The patient was placed in dorsal lithotomy position and prepped and draped in usual sterile fashion. Sequential compression devices were applied to lower extremities at the beginning of the case for DVT prophylaxis. Antibiotics were infused prior to surgery start time. A surgical time-out was performed to properly identify the patient, the surgery to be performed, and the surgical site.     We then passed the 21-French rigid cystoscope down the urethra there was a stricture noted at the bulbar urethra.  A sensor wire was then placed by the urethral stricture.  A UroMax balloon was passed by the stricture and then dilated to 24 [place].  There is also noted to be a slight meatal stricture this was dilated to 30 [place] using [employer].  Once both the strictures were dilated the 22 French cystoscope was advanced into the bladder under direct vision without any difficulty.  The prostate was non-obstructing. The bladder was inspected with 30 and 70 degree lenses. Once in the bladder, systematic evaluation of bladder revealed some papillary tumor near the right  ureteral orifice as well as the original found tumor near the left bladder wall in the diverticulum..  The surrounding tissue around the right ureteral orifice was involved with papillary tumor.   We then removed the cystoscope and then passed down the 26 French resectoscope sheath down the urethra into the bladder under direct vision with the visual obturator. The tumor was resected down to muscle.  This was difficult due to the diverticulum and there was a slight perforation at the location where the tumor covered the connection between the diverticulum and the bladder wall.  Fat  was visualized.  The TUR bladder tumor chips were retrieved from the bladder and each region of resection was passed off the field as a separate specimen.  We then turned our attention to the right ureteral orifice resection was gone over the ureteral orifice there was no visual tumor within the ureter.  Then a sensor wires placed in the ureter a 5 French urethral catheter was advanced over the wire and then a retrograde pyelogram was performed.  There were no filling defects or abnormalities within the ureter.  The 5 French [place]lick catheter was removed and a sensor wire was replaced and a 6x24 double-J stent was placed over the wire.  Hemostasis was achieved using electrocautery. We then proceeded with removing the resectoscope and then placed in a 18Foley silicone catheter..  The patient tolerated the procedure well with no complication and was awoken from anesthesia and taken to recovery in stable condition.       Plan: Plan to follow-up in 10-14days for pathology review cystoscopy stent removal and void trial  [facility]

## 2023-12-11 NOTE — Transfer of Care (Signed)
 Immediate Anesthesia Transfer of Care Note  Patient: [name] Fernandez  Procedure(s) Performed: TURBT (Bladder) CYSTOSCOPY, WITH RETROGRADE PYELOGRAM (Bilateral: Ureter)  Patient Location: PACU  Anesthesia Type:General  Level of Consciousness: awake, alert , and oriented  Airway & Oxygen Therapy: Patient Spontanous Breathing  Post-op Assessment: Report given to RN and Post -op Vital signs reviewed and stable  Post vital signs: Reviewed and stable  Last Vitals:  Vitals Value Taken Time  BP 150/72 12/11/23 12:53  Temp    Pulse 78 12/11/23 12:56  Resp 17 12/11/23 12:56  SpO2 99 % 12/11/23 12:56  Vitals shown include unfiled device data.  Last Pain:  Vitals:   12/11/23 1048  TempSrc:   PainSc: 0-No pain         Complications: No notable events documented.

## 2023-12-11 NOTE — Anesthesia Procedure Notes (Signed)
 Procedure Name: Intubation Date/Time: 12/11/2023 11:33 AM  Performed by: Cena Epps, CRNAPre-anesthesia Checklist: Patient identified, Emergency Drugs available, Suction available and Patient being monitored Patient Re-evaluated:Patient Re-evaluated prior to induction Oxygen Delivery Method: Circle System Utilized Preoxygenation: Pre-oxygenation with 100% oxygen Induction Type: IV induction Ventilation: Mask ventilation without difficulty Laryngoscope Size: Mac and 4 Grade View: Grade I Tube type: Oral Tube size: 7.5 mm Number of attempts: 1 Airway Equipment and Method: Stylet and Oral airway Placement Confirmation: ETT inserted through vocal cords under direct vision, positive ETCO2 and breath sounds checked- equal and bilateral Secured at: 23 cm Tube secured with: Tape Dental Injury: Teeth and Oropharynx as per pre-operative assessment

## 2023-12-12 ENCOUNTER — Encounter (HOSPITAL_COMMUNITY): Payer: Self-pay | Admitting: Urology

## 2023-12-12 LAB — SURGICAL PATHOLOGY

## 2023-12-12 NOTE — Anesthesia Postprocedure Evaluation (Signed)
 Anesthesia Post Note  Patient: [name] Fernandez  Procedure(s) Performed: TURBT (Bladder) CYSTOSCOPY, WITH RETROGRADE PYELOGRAM (Bilateral: Ureter)     Patient location during evaluation: PACU Anesthesia Type: General Level of consciousness: sedated and patient cooperative Pain management: pain level controlled Vital Signs Assessment: post-procedure vital signs reviewed and stable Respiratory status: spontaneous breathing Cardiovascular status: stable Anesthetic complications: no   No notable events documented.  Last Vitals:  Vitals:   12/11/23 1330 12/11/23 1347  BP: (!) 151/87 (!) 165/75  Pulse: 72 70  Resp: (!) 22   Temp:  36.5 C  SpO2: 96% 94%    Last Pain:  Vitals:   12/11/23 1347  TempSrc: (P) Oral  PainSc: 0-No pain                 Norleen Pope

## 2023-12-17 ENCOUNTER — Ambulatory Visit: Admitting: Family Medicine

## 2023-12-17 DIAGNOSIS — C678 Malignant neoplasm of overlapping sites of bladder: Secondary | ICD-10-CM | POA: Diagnosis not present

## 2023-12-26 DIAGNOSIS — C678 Malignant neoplasm of overlapping sites of bladder: Secondary | ICD-10-CM | POA: Diagnosis not present

## 2023-12-27 ENCOUNTER — Ambulatory Visit: Admitting: Orthopedic Surgery

## 2023-12-27 DIAGNOSIS — L97322 Non-pressure chronic ulcer of left ankle with fat layer exposed: Secondary | ICD-10-CM | POA: Diagnosis not present

## 2023-12-27 DIAGNOSIS — Z945 Skin transplant status: Secondary | ICD-10-CM | POA: Diagnosis not present

## 2024-01-01 ENCOUNTER — Encounter: Payer: Self-pay | Admitting: Orthopedic Surgery

## 2024-01-01 NOTE — Progress Notes (Signed)
 Office Visit Note   Patient: [name] Fernandez           Date of Birth: 19-Jun-1940           MRN: [id number] Visit Date: 12/27/2023              Requested by: No referring provider defined for this encounter. PCP: Patient, No Pcp Per  Chief Complaint  Patient presents with   Left Ankle - Wound Check      HPI: Patient is a 83 year old gentleman who is seen in follow-up for chronic ulceration lateral left ankle.  Patient has been undergoing serial compression wraps with Vashe dressing changes.  Patient states that he recently had biopsy for bladder cancer.  Patient states he has had increased swelling in his legs since the biopsy.  Assessment & Plan: Visit Diagnoses: No diagnosis found.  Plan: Patient will continue with compression Vashe and Iodosorb dressing changes.  Follow-Up Instructions: No follow-ups on file.   Ortho Exam  Patient is alert, oriented, no adenopathy, well-dressed, normal affect, normal respiratory effort. Examination there is fibrinous exudative tissue covering the entire wound which seems consistent with biofilm.  There is no cellulitis no drainage there is venous stasis swelling.  Patient has a strong dorsalis pedis pulse.  The ulcer measures 4.5 x 4.5 cm.    Imaging: No results found.   Labs: Lab Results  Component Value Date   HGBA1C 6.2 (H) 09/22/2023   HGBA1C 6.5 (H) 04/10/2023   HGBA1C 6.3 (H) 10/05/2022   ESRSEDRATE 34 (H) 01/15/2023   ESRSEDRATE 32 (H) 06/15/2022   ESRSEDRATE 40 (H) 06/14/2022   CRP 0.7 01/15/2023   CRP 0.8 06/15/2022   CRP <0.5 06/14/2022   LABURIC 7.8 05/20/2014   LABURIC 6.8 05/20/2013   REPTSTATUS 01/17/2023 FINAL 01/12/2023   GRAMSTAIN NO WBC SEEN NO ORGANISMS SEEN  01/12/2023   CULT  01/12/2023    No growth aerobically or anaerobically. Performed at [facility], [address], [place], [place] [address]    LABORGA PSEUDOMONAS AERUGINOSA 11/10/2022   LABORGA ENTEROCOCCUS FAECALIS 11/10/2022      Lab Results  Component Value Date   ALBUMIN 3.9 09/21/2023   ALBUMIN 2.9 (L) 06/16/2022   ALBUMIN 3.8 06/14/2022   PREALBUMIN 18 06/14/2022    Lab Results  Component Value Date   MG 1.8 04/10/2023   MG 1.8 06/19/2022   MG 1.9 04/04/2022   Lab Results  Component Value Date   VD25OH 45 04/10/2023   VD25OH 57 10/05/2022   VD25OH 59 04/04/2022    Lab Results  Component Value Date   PREALBUMIN 18 06/14/2022      Latest Ref Rng & Units 12/06/2023    2:24 PM 09/21/2023    1:24 PM 04/10/2023    3:22 PM  CBC EXTENDED  WBC 4.0 - 10.5 K/uL 7.6  8.3  8.3   RBC 4.22 - 5.81 MIL/uL 3.54  3.84  3.65   Hemoglobin 13.0 - 17.0 g/dL 89.3  88.8  88.6   HCT 39.0 - 52.0 % 33.3  33.4  34.3   Platelets 150 - 400 K/uL 541  451  474   NEUT# 1,500 - 7,800 cells/uL   4,382      There is no height or weight on file to calculate BMI.  Orders:  No orders of the defined types were placed in this encounter.  No orders of the defined types were placed in this encounter.    Procedures: No  procedures performed  Clinical Data: No additional findings.  ROS:  All other systems negative, except as noted in the HPI. Review of Systems  Objective: Vital Signs: There were no vitals taken for this visit.  Specialty Comments:  No specialty comments available.  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/16/2022   Osteomyelitis (HCC) 06/16/2022   Tenosynovitis of tibialis posterior tendon 06/15/2022   Wound infection 06/15/2022   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   Anemia 03/13/2019   Chronic venous insufficiency 03/12/2019    CKD (chronic kidney disease) stage 2, 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/2015   COPD (chronic obstructive pulmonary disease) (HCC) 12/08/2015   Chronic pain syndrome 05/26/2015   DDD (degenerative disc disease), lumbar 05/26/2015   Medication management 05/26/2015   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   Lumbago with sciatica 08/06/2014   Hyperlipidemia 08/20/2013   Essential hypertension    Abnormal glucose    Vitamin D  deficiency    Arthritis    Vitamin B12 deficiency    Celiac disease 06/19/2012   GERD (gastroesophageal reflux disease) 04/30/2012   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 tube) procedure 04/30/2012   Hypothyroidism 04/30/2012   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   Past Medical History:  Diagnosis Date   Anemia    Celiac disease    diagnoses 06/24/12   Chronic ulcer of ankle (HCC)    left   COPD (chronic obstructive pulmonary disease) (HCC)    Esophageal stricture    GERD (gastroesophageal reflux disease)    Hiatal hernia    Hypertension    patient states  no current HTN   Hypothyroidism    Pre-diabetes    Prediabetes    Shortness of breath    from oxycodone  at times   Vitamin B12 deficiency    Vitamin D  deficiency     Family History  Problem Relation Age of Onset   Esophageal cancer Father     Past Surgical History:  Procedure Laterality Date   APPENDECTOMY     APPLICATION OF WOUND VAC Left 01/12/2023   Procedure: APPLICATION OF WOUND VAC;  Surgeon: Harden Jerona GAILS, MD;  Location: [facility] OR;  Service: Orthopedics;  Laterality: Left;   BACK SURGERY     3 diff surgeries for vertebrea broken   CYSTOSCOPY W/ RETROGRADES Bilateral 12/11/2023   Procedure: CYSTOSCOPY, WITH RETROGRADE PYELOGRAM;  Surgeon: Shane Steffan BROCKS, MD;  Location: [facility] ORS;  Service: Urology;  Laterality: Bilateral;   EYE SURGERY     bilateral cataract surgery   HERNIA REPAIR  02/10/1949   bilateral groin as child   HIATAL HERNIA REPAIR     I & D EXTREMITY Left 06/16/2022   Procedure:  IRRIGATION AND DEBRIDEMENT ANKLE;  Surgeon: Harden Jerona GAILS, MD;  Location: [facility] OR;  Service: Orthopedics;  Laterality: Left;   I & D EXTREMITY Left 07/21/2022   Procedure: LEFT ANKLE DEBRIDEMENT;  Surgeon: Harden Jerona GAILS, MD;  Location: [facility] OR;  Service: Orthopedics;  Laterality: Left;   I & D EXTREMITY Left 11/10/2022   Procedure: LEFT ANKLE DEBRIDEMENT;  Surgeon: Harden Jerona GAILS, MD;  Location: [facility] OR;  Service: Orthopedics;  Laterality:  Left;   I & D EXTREMITY Left 01/12/2023   Procedure: LEFT ANKLE DEBRIDEMENT;  Surgeon: Harden Jerona GAILS, MD;  Location: [facility] OR;  Service: Orthopedics;  Laterality: Left;   JOINT REPLACEMENT  04/12/2010   right knee replacement   TONSILLECTOMY     as child   TOTAL KNEE REVISION  11/20/2011   Procedure: TOTAL KNEE REVISION;  Surgeon: Donnice JONETTA Car, MD;  Location: [facility];  Service: Orthopedics;  Laterality: Right;  Right Total Knee Revision   Social History   Occupational History   Occupation: Retired    Associate Professor: [employer]  Tobacco Use   Smoking status: Former    Current packs/day: 0.00    Average packs/day: 1 pack/day for 20.0 years (20.0 ttl pk-yrs)    Types: Cigarettes    Start date: 06/27/1960    Quit date: 06/27/1980    Years since quitting: 43.5    Passive exposure: Past   Smokeless tobacco: Never  Vaping Use   Vaping status: Never Used  Substance and Sexual Activity   Alcohol use: No   Drug use: No   Sexual activity: Yes

## 2024-01-16 ENCOUNTER — Ambulatory Visit: Payer: Medicare Other | Admitting: Nurse Practitioner

## 2024-01-18 ENCOUNTER — Other Ambulatory Visit: Payer: Self-pay | Admitting: Nurse Practitioner

## 2024-01-18 DIAGNOSIS — R609 Edema, unspecified: Secondary | ICD-10-CM

## 2024-01-18 DIAGNOSIS — I10 Essential (primary) hypertension: Secondary | ICD-10-CM

## 2024-01-22 ENCOUNTER — Ambulatory Visit: Admitting: Orthopedic Surgery

## 2024-01-24 DIAGNOSIS — C678 Malignant neoplasm of overlapping sites of bladder: Secondary | ICD-10-CM | POA: Diagnosis not present

## 2024-01-24 DIAGNOSIS — I700 Atherosclerosis of aorta: Secondary | ICD-10-CM | POA: Diagnosis not present

## 2024-01-24 DIAGNOSIS — J439 Emphysema, unspecified: Secondary | ICD-10-CM | POA: Diagnosis not present

## 2024-01-30 DIAGNOSIS — C678 Malignant neoplasm of overlapping sites of bladder: Secondary | ICD-10-CM | POA: Diagnosis not present

## 2024-02-05 ENCOUNTER — Ambulatory Visit (INDEPENDENT_AMBULATORY_CARE_PROVIDER_SITE_OTHER): Admitting: Orthopedic Surgery

## 2024-02-05 ENCOUNTER — Encounter: Payer: Self-pay | Admitting: Orthopedic Surgery

## 2024-02-05 DIAGNOSIS — L97322 Non-pressure chronic ulcer of left ankle with fat layer exposed: Secondary | ICD-10-CM | POA: Diagnosis not present

## 2024-02-05 DIAGNOSIS — L97323 Non-pressure chronic ulcer of left ankle with necrosis of muscle: Secondary | ICD-10-CM

## 2024-02-05 DIAGNOSIS — Z945 Skin transplant status: Secondary | ICD-10-CM | POA: Diagnosis not present

## 2024-02-05 DIAGNOSIS — I890 Lymphedema, not elsewhere classified: Secondary | ICD-10-CM | POA: Diagnosis not present

## 2024-02-05 NOTE — Progress Notes (Addendum)
 Office Visit Note   Patient: [name] Fernandez           Date of Birth: 1941/02/11           MRN: [id number] Visit Date: 02/05/2024              Requested by: No referring provider defined for this encounter. PCP: Patient, No Pcp Per  Chief Complaint  Patient presents with   Left Ankle - Wound Check      HPI: Discussed the use of AI scribe software for clinical note transcription with the patient, who gave verbal consent to proceed.  History of Present Illness [name] Fernandez is an 83 year old male who presents with a non-healing leg wound.  He has a non-healing wound on his leg measuring 3.5 cm by 5 cm. The patient reports he has not noticed any change in the wound since switching from [place] to Iodosorb.  He experiences significant swelling in both legs, with the right leg being particularly affected. The swelling extends to his feet, with notable dorsal foot swelling and associated itching, especially behind the knee and on top of the foot. He is unable to wear compression socks due to difficulty putting them on and currently has his knee wrapped to manage the swelling.  He has a history of bladder cancer, which has not metastasized to the lungs. He is on a natural treatment regimen including ivermectin, benbenzadol, CBD drops, and various vitamins. He reports no pain related to his bladder cancer and has opted against surgical intervention due to the seriousness of the procedure and potential side effects.     Assessment & Plan: Visit Diagnoses:  1. 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 H/O skin graft   3. Ankle ulcer, left, with necrosis of muscle (HCC)   4. Lymphedema     Plan: Assessment and Plan Assessment & Plan Chronic non-healing lower extremity wound Wound 3.5 cm x 5 cm with healthy granulation and fibrinous tissue, no cellulitis or drainage, decreased in size but stalled. - Switch to [employer] dressing from Iodosorb.  Bilateral lower  extremity lymphedema with severe swelling Severe bilateral leg swelling, difficulty with compression socks, dorsal foot swelling and itching, good dorsalis pedis pulse. Lymphedema pumps considered, insurance approval challenging. - Measure legs for lymphedema pumps. - Submit measurements to insurance for pump approval.      Follow-Up Instructions: Return in about 4 weeks (around 03/04/2024).   Ortho Exam  Patient is alert, oriented, no adenopathy, well-dressed, normal affect, normal respiratory effort. Physical Exam EXTREMITIES: Bilateral leg swelling, not improved with compression. Ankle swelling. Dorsal foot swelling. Dorsalis pedis pulse palpable. SKIN: Wound 3.5 cm x 5 cm, flat healthy granulation tissue, fibrinous tissue on top. No cellulitis or drainage around wound.     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  He has lymphedema on both lower extremities identified on 03/12/2023.  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  Patient has undergone conservative treatment since March 19, 2023 with elevation compression including compression garments with a minimum of 15 to 20 mm of compression as well as Dynaflex compression wraps.  Patient has not tried a basic pneumatic compression device E0 651.  Circumferential measurements of the ankle are 26 cm on the right 30 cm on the left.  Circumferential calf measurement 38 cm on the right 42 cm on the left.  Circumferential thigh measurement 46 cm on  the right 43 cm on the left.   Imaging: No results found.   Labs: Lab Results  Component Value Date   HGBA1C 6.2 (H) 09/22/2023   HGBA1C 6.5 (H) 04/10/2023   HGBA1C 6.3 (H) 10/05/2022   ESRSEDRATE 34 (H) 01/15/2023   ESRSEDRATE 32 (H) 06/15/2022   ESRSEDRATE 40 (H) 06/14/2022   CRP 0.7 01/15/2023   CRP 0.8 06/15/2022   CRP <0.5 06/14/2022   LABURIC 7.8 05/20/2014   LABURIC 6.8 05/20/2013   REPTSTATUS 01/17/2023 FINAL  01/12/2023   GRAMSTAIN NO WBC SEEN NO ORGANISMS SEEN  01/12/2023   CULT  01/12/2023    No growth aerobically or anaerobically. Performed at [facility], [address] [place], [place] [address]    LABORGA PSEUDOMONAS AERUGINOSA 11/10/2022   LABORGA ENTEROCOCCUS FAECALIS 11/10/2022     Lab Results  Component Value Date   ALBUMIN 3.9 09/21/2023   ALBUMIN 2.9 (L) 06/16/2022   ALBUMIN 3.8 06/14/2022   PREALBUMIN 18 06/14/2022    Lab Results  Component Value Date   MG 1.8 04/10/2023   MG 1.8 06/19/2022   MG 1.9 04/04/2022   Lab Results  Component Value Date   VD25OH 45 04/10/2023   VD25OH 57 10/05/2022   VD25OH 59 04/04/2022    Lab Results  Component Value Date   PREALBUMIN 18 06/14/2022      Latest Ref Rng & Units 12/06/2023    2:24 PM 09/21/2023    1:24 PM 04/10/2023    3:22 PM  CBC EXTENDED  WBC 4.0 - 10.5 K/uL 7.6  8.3  8.3   RBC 4.22 - 5.81 MIL/uL 3.54  3.84  3.65   Hemoglobin 13.0 - 17.0 g/dL 89.3  88.8  88.6   HCT 39.0 - 52.0 % 33.3  33.4  34.3   Platelets 150 - 400 K/uL 541  451  474   NEUT# 1,500 - 7,800 cells/uL   4,382      There is no height or weight on file to calculate BMI.  Orders:  No orders of the defined types were placed in this encounter.  No orders of the defined types were placed in this encounter.    Procedures: No procedures performed  Clinical Data: No additional findings.  ROS:  All other systems negative, except as noted in the HPI. Review of Systems  Objective: Vital Signs: There were no vitals taken for this visit.  Specialty Comments:  No specialty comments available.  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) 06/16/2022   Osteomyelitis (HCC) 06/16/2022   Tenosynovitis of tibialis posterior tendon 06/15/2022   Wound infection 06/15/2022   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   Anemia 03/13/2019   Chronic venous insufficiency 03/12/2019   CKD (chronic kidney disease) stage 2, 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/2015   COPD (chronic obstructive pulmonary disease) (HCC) 12/08/2015   Chronic pain syndrome 05/26/2015   DDD (degenerative disc disease), lumbar 05/26/2015   Medication management 05/26/2015   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   Lumbago with sciatica 08/06/2014   Hyperlipidemia 08/20/2013   Essential hypertension    Abnormal glucose    Vitamin D   deficiency    Arthritis    Vitamin B12 deficiency    Celiac disease 06/19/2012   GERD (gastroesophageal reflux disease) 04/30/2012   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 tube) procedure 04/30/2012   Hypothyroidism 04/30/2012   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   Past Medical History:  Diagnosis Date   Anemia    Celiac disease    diagnoses 06/24/12   Chronic ulcer of ankle (HCC)    left   COPD (chronic obstructive pulmonary disease) (HCC)    Esophageal stricture    GERD (gastroesophageal reflux disease)    Hiatal hernia    Hypertension    patient states  no current HTN   Hypothyroidism    Pre-diabetes    Prediabetes    Shortness of breath    from oxycodone  at times   Vitamin B12 deficiency    Vitamin D  deficiency     Family History  Problem Relation Age of Onset   Esophageal cancer Father     Past Surgical History:  Procedure Laterality Date   APPENDECTOMY     APPLICATION OF WOUND VAC Left 01/12/2023   Procedure: APPLICATION OF WOUND VAC;  Surgeon: Harden Jerona GAILS, MD;  Location: [facility] OR;  Service: Orthopedics;  Laterality: Left;   BACK SURGERY     3 diff surgeries for vertebrea broken   CYSTOSCOPY W/ RETROGRADES Bilateral 12/11/2023   Procedure: CYSTOSCOPY,  WITH RETROGRADE PYELOGRAM;  Surgeon: Shane Steffan BROCKS, MD;  Location: [facility];  Service: Urology;  Laterality: Bilateral;   EYE SURGERY     bilateral cataract surgery   HERNIA REPAIR  02/10/1949   bilateral groin as child   HIATAL HERNIA REPAIR     I & D EXTREMITY Left 06/16/2022   Procedure: IRRIGATION AND DEBRIDEMENT ANKLE;  Surgeon: Harden Jerona GAILS, MD;  Location: [facility] OR;  Service: Orthopedics;  Laterality: Left;   I & D EXTREMITY Left 07/21/2022   Procedure: LEFT ANKLE DEBRIDEMENT;  Surgeon: Harden Jerona GAILS, MD;  Location: [facility] OR;  Service: Orthopedics;  Laterality: Left;   I & D EXTREMITY Left 11/10/2022   Procedure: LEFT ANKLE DEBRIDEMENT;  Surgeon: Harden Jerona GAILS, MD;  Location: [facility] OR;  Service: Orthopedics;  Laterality: Left;   I & D EXTREMITY Left 01/12/2023   Procedure: LEFT ANKLE DEBRIDEMENT;  Surgeon: Harden Jerona GAILS, MD;  Location: [facility] OR;  Service: Orthopedics;  Laterality: Left;   JOINT REPLACEMENT  04/12/2010   right knee replacement   TONSILLECTOMY     as child   TOTAL KNEE REVISION  11/20/2011   Procedure: TOTAL KNEE REVISION;  Surgeon: Donnice JONETTA Car, MD;  Location: [facility] OR[facility];  Service: Orthopedics;  Laterality: Right;  Right Total Knee Revision   Social History   Occupational History   Occupation: Retired    Associate Professor: [employer]  Tobacco Use   Smoking status: Former    Current packs/day: 0.00    Average packs/day: 1 pack/day for 20.0 years (20.0 ttl pk-yrs)    Types: Cigarettes    Start date: 06/27/1960    Quit date: 06/27/1980    Years since quitting: 43.6    Passive exposure: Past   Smokeless tobacco: Never  Vaping Use   Vaping status: Never Used  Substance and Sexual Activity   Alcohol use: No   Drug use: No   Sexual activity: Yes

## 2024-02-15 DIAGNOSIS — D414 Neoplasm of uncertain behavior of bladder: Secondary | ICD-10-CM | POA: Diagnosis not present

## 2024-02-15 DIAGNOSIS — N35013 Post-traumatic anterior urethral stricture: Secondary | ICD-10-CM | POA: Diagnosis not present

## 2024-02-15 DIAGNOSIS — C678 Malignant neoplasm of overlapping sites of bladder: Secondary | ICD-10-CM | POA: Diagnosis not present

## 2024-03-06 ENCOUNTER — Ambulatory Visit: Admitting: Orthopedic Surgery

## 2024-03-10 ENCOUNTER — Ambulatory Visit (INDEPENDENT_AMBULATORY_CARE_PROVIDER_SITE_OTHER): Admitting: Orthopedic Surgery

## 2024-03-10 DIAGNOSIS — Z945 Skin transplant status: Secondary | ICD-10-CM

## 2024-03-10 DIAGNOSIS — L97322 Non-pressure chronic ulcer of left ankle with fat layer exposed: Secondary | ICD-10-CM

## 2024-03-10 DIAGNOSIS — I890 Lymphedema, not elsewhere classified: Secondary | ICD-10-CM | POA: Diagnosis not present

## 2024-03-11 ENCOUNTER — Encounter: Payer: Self-pay | Admitting: Family Medicine

## 2024-03-11 ENCOUNTER — Encounter: Payer: Self-pay | Admitting: Orthopedic Surgery

## 2024-03-11 ENCOUNTER — Ambulatory Visit: Admitting: Family Medicine

## 2024-03-11 VITALS — BP 110/82 | HR 65 | Temp 97.6°F | Ht 67.0 in | Wt 166.2 lb

## 2024-03-11 DIAGNOSIS — L97323 Non-pressure chronic ulcer of left ankle with necrosis of muscle: Secondary | ICD-10-CM | POA: Diagnosis not present

## 2024-03-11 DIAGNOSIS — L299 Pruritus, unspecified: Secondary | ICD-10-CM | POA: Diagnosis not present

## 2024-03-11 DIAGNOSIS — Z23 Encounter for immunization: Secondary | ICD-10-CM | POA: Diagnosis not present

## 2024-03-11 MED ORDER — METHYLPREDNISOLONE ACETATE 40 MG/ML IJ SUSP
40.0000 mg | Freq: Once | INTRAMUSCULAR | Status: AC
  Administered 2024-03-11: 40 mg via INTRAMUSCULAR

## 2024-03-11 NOTE — Patient Instructions (Addendum)
 Welcome to [facility]!  Thank you for choosing us  for your Primary Care needs.   We offer in person and video appointments for your convenience. You may call our office to schedule appointments, or you may schedule appointments with me through MyChart.   The best way to get in contact with me is via MyChart message. This will get to me faster than a phone call, unless there is an emergency, then please call 911.  The lab is located downstairs in the Sports Medicine building, we also have xray available there.   We have given your flu vaccine today.   You have received a steroid injection in the office today.  Follow up with me in about 6 months for your physical, sooner if needed.

## 2024-03-11 NOTE — Progress Notes (Signed)
 Office Visit Note   Patient: [name] Fernandez           Date of Birth: May 04, 1941           MRN: [id number] Visit Date: 03/10/2024              Requested by: No referring provider defined for this encounter. PCP: Patient, No Pcp Per  Chief Complaint  Patient presents with   Left Ankle - Wound Check      HPI: Discussed the use of AI scribe software for clinical note transcription with the patient, who gave verbal consent to proceed.  History of Present Illness [name] Fernandez is an 83 year old male who presents for wound management. Another doctor referred him for wound management.  He has a long-standing wound on his leg, measuring four by five centimeters, which has been draining significantly and causing concern. In May 2023, a vein was cut during a cleaning procedure, leading to significant bleeding and a visit to the emergency room.  He has been using various treatments, including a wash and an orange substance, but feels he has not been effective. He is awaiting the delivery of lymphedema pumps to help reduce the swelling in his leg.  He experiences significant swelling in his leg. He is awaiting new pumps to manage the persistent and challenging swelling.     Assessment & Plan: Visit Diagnoses:  1. 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 Lymphedema   3. 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 - Initiate lymphedema pump therapy on Wednesday. - Continue daily bupropion adjustments. - Follow up in four weeks to evaluate wound healing and leg swelling.  Will call patient to discuss trial of platelet rich plasma application.    Follow-Up Instructions: Return in  about 1 week (around 03/17/2024).   Ortho Exam  Patient is alert, oriented, no adenopathy, well-dressed, normal affect, normal respiratory effort. Physical Exam EXTREMITIES: Venous and lymphatic swelling of the leg with pitting edema. SKIN: Wound measures 4x5 cm with flat granulation tissue. Small amount of clear serosanguineous drainage. No cellulitis.      Imaging: No results found. No images are attached to the encounter.  Labs: Lab Results  Component Value Date   HGBA1C 6.2 (H) 09/22/2023   HGBA1C 6.5 (H) 04/10/2023   HGBA1C 6.3 (H) 10/05/2022   ESRSEDRATE 34 (H) 01/15/2023   ESRSEDRATE 32 (H) 06/15/2022   ESRSEDRATE 40 (H) 06/14/2022   CRP 0.7 01/15/2023   CRP 0.8 06/15/2022   CRP <0.5 06/14/2022   LABURIC 7.8 05/20/2014   LABURIC 6.8 05/20/2013   REPTSTATUS 01/17/2023 FINAL 01/12/2023   GRAMSTAIN NO WBC SEEN NO ORGANISMS SEEN  01/12/2023   CULT  01/12/2023    No growth aerobically or anaerobically. Performed at [facility], [address], [place], [place] [address]    LABORGA PSEUDOMONAS AERUGINOSA 11/10/2022   LABORGA ENTEROCOCCUS FAECALIS 11/10/2022     Lab Results  Component Value Date   ALBUMIN 3.9 09/21/2023   ALBUMIN 2.9 (L) 06/16/2022   ALBUMIN 3.8 06/14/2022   PREALBUMIN 18 06/14/2022    Lab Results  Component Value Date   MG 1.8 04/10/2023   MG 1.8 06/19/2022   MG 1.9 04/04/2022  Lab Results  Component Value Date   VD25OH 45 04/10/2023   VD25OH 57 10/05/2022   VD25OH 59 04/04/2022    Lab Results  Component Value Date   PREALBUMIN 18 06/14/2022      Latest Ref Rng & Units 12/06/2023    2:24 PM 09/21/2023    1:24 PM 04/10/2023    3:22 PM  CBC EXTENDED  WBC 4.0 - 10.5 K/uL 7.6  8.3  8.3   RBC 4.22 - 5.81 MIL/uL 3.54  3.84  3.65   Hemoglobin 13.0 - 17.0 g/dL 89.3  88.8  88.6   HCT 39.0 - 52.0 % 33.3  33.4  34.3   Platelets 150 - 400 K/uL 541  451  474   NEUT# 1,500 - 7,800 cells/uL   4,382      There is no height or  weight on file to calculate BMI.  Orders:  No orders of the defined types were placed in this encounter.  No orders of the defined types were placed in this encounter.    Procedures: No procedures performed  Clinical Data: No additional findings.  ROS:  All other systems negative, except as noted in the HPI. Review of Systems  Objective: Vital Signs: There were no vitals taken for this visit.  Specialty Comments:  No specialty comments available.  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/16/2022   Osteomyelitis (HCC) 06/16/2022   Tenosynovitis of tibialis posterior tendon 06/15/2022   Wound infection 06/15/2022   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   Anemia 03/13/2019   Chronic venous insufficiency 03/12/2019   CKD (chronic kidney disease) stage 2, 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/2015   COPD (chronic obstructive pulmonary disease) (HCC) 12/08/2015   Chronic pain syndrome 05/26/2015   DDD (degenerative disc disease), lumbar 05/26/2015   Medication management 05/26/2015   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   Lumbago with sciatica 08/06/2014   Hyperlipidemia 08/20/2013   Essential hypertension    Abnormal glucose    Vitamin D  deficiency    Arthritis    Vitamin B12 deficiency    Celiac disease 06/19/2012   GERD (gastroesophageal reflux disease) 04/30/2012   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 tube) procedure 04/30/2012   Hypothyroidism 04/30/2012   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   Past Medical History:  Diagnosis Date   Anemia    Celiac disease    diagnoses 06/24/12   Chronic ulcer of ankle (HCC)     left   COPD (chronic obstructive pulmonary disease) (HCC)    Esophageal stricture    GERD (gastroesophageal reflux disease)    Hiatal hernia    Hypertension    patient states  no current HTN   Hypothyroidism    Pre-diabetes    Prediabetes    Shortness of breath    from oxycodone  at times   Vitamin B12 deficiency    Vitamin D  deficiency     Family History  Problem Relation Age of Onset   Esophageal cancer Father     Past Surgical History:  Procedure Laterality Date   APPENDECTOMY     APPLICATION OF WOUND VAC Left 01/12/2023   Procedure: APPLICATION OF  WOUND VAC;  Surgeon: Harden Jerona GAILS, MD;  Location: [facility] OR;  Service: Orthopedics;  Laterality: Left;   BACK SURGERY     3 diff surgeries for vertebrea broken   CYSTOSCOPY W/ RETROGRADES Bilateral 12/11/2023   Procedure: CYSTOSCOPY, WITH RETROGRADE PYELOGRAM;  Surgeon: Shane Steffan BROCKS, MD;  Location: [facility] ORS;  Service: Urology;  Laterality: Bilateral;   EYE SURGERY     bilateral cataract surgery   HERNIA REPAIR  02/10/1949   bilateral groin as child   HIATAL HERNIA REPAIR     I & D EXTREMITY Left 06/16/2022   Procedure: IRRIGATION AND DEBRIDEMENT ANKLE;  Surgeon: Harden Jerona GAILS, MD;  Location: [facility] OR;  Service: Orthopedics;  Laterality: Left;   I & D EXTREMITY Left 07/21/2022   Procedure: LEFT ANKLE DEBRIDEMENT;  Surgeon: Harden Jerona GAILS, MD;  Location: [facility] OR;  Service: Orthopedics;  Laterality: Left;   I & D EXTREMITY Left 11/10/2022   Procedure: LEFT ANKLE DEBRIDEMENT;  Surgeon: Harden Jerona GAILS, MD;  Location: [facility] OR;  Service: Orthopedics;  Laterality: Left;   I & D EXTREMITY Left 01/12/2023   Procedure: LEFT ANKLE DEBRIDEMENT;  Surgeon: Harden Jerona GAILS, MD;  Location: [facility] OR;  Service: Orthopedics;  Laterality: Left;   JOINT REPLACEMENT  04/12/2010   right knee replacement   TONSILLECTOMY     as child   TOTAL KNEE REVISION  11/20/2011   Procedure: TOTAL KNEE REVISION;  Surgeon: Donnice JONETTA Car, MD;  Location: [facility];  Service:  Orthopedics;  Laterality: Right;  Right Total Knee Revision   Social History   Occupational History   Occupation: Retired    Associate Professor: [employer]  Tobacco Use   Smoking status: Former    Current packs/day: 0.00    Average packs/day: 1 pack/day for 20.0 years (20.0 ttl pk-yrs)    Types: Cigarettes    Start date: 06/27/1960    Quit date: 06/27/1980    Years since quitting: 43.7    Passive exposure: Past   Smokeless tobacco: Never  Vaping Use   Vaping status: Never Used  Substance and Sexual Activity   Alcohol use: No   Drug use: No   Sexual activity: Yes

## 2024-03-11 NOTE — Progress Notes (Signed)
 New Patient Visit  Subjective:     Patient ID: [name] Fernandez, male    DOB: 07/13/1940, 83 y.o.   MRN: [id number]  Chief Complaint  Patient presents with   Establish Care    Establishing Care    HPI  Discussed the use of AI scribe software for clinical note transcription with the patient, who gave verbal consent to proceed.  History of Present Illness [name] Fernandez is an 83 year old male who presents for wound management and follow-up of a chronic ankle ulcer.  Chronic ankle ulcer - Chronic ulcer located on the side of the ankle, present for over two years - Initial presentation as a small blister that expanded and turned black, involving the foot - History of four hospitalizations related to the ulcer - Multiple treatments at a wound care center, including IV antibiotics and a blood-derived paste - Sulfate cream and Bosch product have been effective on the foot - Uses compression stockings for ulcer management - Considering additional compression devices to aid blood flow and ulcer management; insurance covers most of the cost  Pruritus and associated rash - Significant itching, especially at night, with development of small blisters - Hydroxyzine  and doxepin  have been ineffective for pruritus - A previous steroid injection provided relief for a rash associated with itching - Believes strong IV antibiotics such as cefazolin  and Zosyn  may have contributed to the itching     ROS Per HPI  Outpatient Encounter Medications as of 03/11/2024  Medication Sig   Ascorbic Acid (VITAMIN C) 1000 MG tablet Take 1,000 mg by mouth in the morning.   betamethasone  dipropionate 0.05 % cream Apply 1 Application topically 2 (two) times daily as needed (skin rash/irritation.).   diphenhydrAMINE  (BENADRYL ) 25 MG tablet Take 50 mg by mouth at bedtime.   doxepin  (SINEQUAN ) 10 MG capsule Take 10-20 mg by mouth at bedtime as needed (itching).   hydrOXYzine  (ATARAX ) 25 MG tablet TAKE 1 TABLET 3  x / day  AS NEEDED FOR ANXIETY OR SLEEP                                                             /                                                                   TAKE                                         BY                                                 MOUTH   MAGNESIUM  PO Take 2 capsules by mouth at bedtime.   MELATONIN PO Take 10 mg by mouth at bedtime.   omeprazole  (PRILOSEC  OTC) 20 MG tablet Take 20 mg by mouth  daily before breakfast.   torsemide  (DEMADEX ) 20 MG tablet Take 2 tablets (40 mg total) by mouth daily as needed (For worsening swelling or more than 2 pound weight gain from your baseline.). Take 2 tablet  Daily for Fluid Retention / Leg Swelling   triamcinolone  cream (KENALOG ) 0.1 % APPLY TO THE LEFT LEG UNDER THE WRAP DAILY WHERE THERE IS ITCHING OR RASH X 2 WEEKS OR UNTIL HEALED (Patient taking differently: Apply 1 Application topically daily.)   Wound Cleansers (VASHE WOUND EX) Apply 1 Application topically daily as needed (wound cleansing).   [DISCONTINUED] hyoscyamine  (ANASPAZ ) 0.125 MG TBDP disintergrating tablet Place 1 tablet (0.125 mg total) under the tongue every 6 (six) hours as needed for up to 10 doses.   [DISCONTINUED] polyethylene glycol (MIRALAX ) 17 g packet Take 17 g by mouth daily.   [DISCONTINUED] zinc  gluconate 50 MG tablet Take 50 mg by mouth in the morning.   [EXPIRED] methylPREDNISolone  acetate (DEPO-MEDROL ) injection 40 mg    No facility-administered encounter medications on file as of 03/11/2024.    Past Medical History:  Diagnosis Date   Anemia    Celiac disease    diagnoses 06/24/12   Chronic ulcer of ankle (HCC)    left   COPD (chronic obstructive pulmonary disease) (HCC)    Esophageal stricture    GERD (gastroesophageal reflux disease)    Hiatal hernia    Hypertension    patient states  no current HTN   Hypothyroidism    Pre-diabetes    Prediabetes    Shortness of breath    from oxycodone  at times   Vitamin B12 deficiency     Vitamin D  deficiency     Past Surgical History:  Procedure Laterality Date   APPENDECTOMY     APPLICATION OF WOUND VAC Left 01/12/2023   Procedure: APPLICATION OF WOUND VAC;  Surgeon: Harden Jerona GAILS, MD;  Location: [facility] OR;  Service: Orthopedics;  Laterality: Left;   BACK SURGERY     3 diff surgeries for vertebrea broken   CYSTOSCOPY W/ RETROGRADES Bilateral 12/11/2023   Procedure: CYSTOSCOPY, WITH RETROGRADE PYELOGRAM;  Surgeon: Shane Steffan BROCKS, MD;  Location: [facility];  Service: Urology;  Laterality: Bilateral;   EYE SURGERY     bilateral cataract surgery   HERNIA REPAIR  02/10/1949   bilateral groin as child   HIATAL HERNIA REPAIR     I & D EXTREMITY Left 06/16/2022   Procedure: IRRIGATION AND DEBRIDEMENT ANKLE;  Surgeon: Harden Jerona GAILS, MD;  Location: [facility] OR;  Service: Orthopedics;  Laterality: Left;   I & D EXTREMITY Left 07/21/2022   Procedure: LEFT ANKLE DEBRIDEMENT;  Surgeon: Harden Jerona GAILS, MD;  Location: [facility] OR;  Service: Orthopedics;  Laterality: Left;   I & D EXTREMITY Left 11/10/2022   Procedure: LEFT ANKLE DEBRIDEMENT;  Surgeon: Harden Jerona GAILS, MD;  Location: [facility] OR;  Service: Orthopedics;  Laterality: Left;   I & D EXTREMITY Left 01/12/2023   Procedure: LEFT ANKLE DEBRIDEMENT;  Surgeon: Harden Jerona GAILS, MD;  Location: [facility] OR;  Service: Orthopedics;  Laterality: Left;   JOINT REPLACEMENT  04/12/2010   right knee replacement   TONSILLECTOMY     as child   TOTAL KNEE REVISION  11/20/2011   Procedure: TOTAL KNEE REVISION;  Surgeon: Donnice JONETTA Car, MD;  Location: [facility];  Service: Orthopedics;  Laterality: Right;  Right Total Knee Revision    Family History  Problem Relation Age of Onset   Esophageal cancer Father  Cancer Father    Early death Mother     Social History   Socioeconomic History   Marital status: Married    Spouse name: Not on file   Number of children: 2   Years of education: Not on file   Highest education level: GED or equivalent  Occupational History    Occupation: Retired    Associate Professor: [employer]  Tobacco Use   Smoking status: Former    Current packs/day: 0.00    Average packs/day: 1.2 packs/day for 31.7 years (37.6 ttl pk-yrs)    Types: Cigarettes    Start date: 06/27/1960    Quit date: 06/27/1980    Years since quitting: 43.7    Passive exposure: Past   Smokeless tobacco: Never  Vaping Use   Vaping status: Never Used  Substance and Sexual Activity   Alcohol use: No   Drug use: No   Sexual activity: Not Currently  Other Topics Concern   Not on file  Social History Narrative   Not on file   Social Drivers of Health   Financial Resource Strain: Low Risk  (03/07/2024)   Overall Financial Resource Strain (CARDIA)    Difficulty of Paying Living Expenses: Not hard at all  Food Insecurity: No Food Insecurity (03/07/2024)   Hunger Vital Sign    Worried About Running Out of Food in the Last Year: Never true    Ran Out of Food in the Last Year: Never true  Transportation Needs: No Transportation Needs (03/07/2024)   PRAPARE - Administrator, Civil Service (Medical): No    Lack of Transportation (Non-Medical): No  Physical Activity: Inactive (03/07/2024)   Exercise Vital Sign    Days of Exercise per Week: 0 days    Minutes of Exercise per Session: Not on file  Stress: Stress Concern Present (03/07/2024)   [employer] of Occupational Health - Occupational Stress Questionnaire    Feeling of Stress: To some extent  Social Connections: Moderately Isolated (03/07/2024)   Social Connection and Isolation Panel    Frequency of Communication with Friends and Family: Three times a week    Frequency of Social Gatherings with Friends and Family: Once a week    Attends Religious Services: Patient declined    Database administrator or Organizations: No    Attends Engineer, structural: Not on file    Marital Status: Married  Catering manager Violence: Not At Risk (09/21/2023)   Humiliation, Afraid, Rape, and  Kick questionnaire    Fear of Current or Ex-Partner: No    Emotionally Abused: No    Physically Abused: No    Sexually Abused: No       Objective:    BP 110/82   Pulse 65   Temp 97.6 F (36.4 C)   Ht 5' 7 (1.702 m)   Wt 166 lb 3.2 oz (75.4 kg)   SpO2 99%   BMI 26.03 kg/m    Physical Exam Vitals and nursing note reviewed.  Constitutional:      General: He is not in acute distress.    Appearance: Normal appearance.  HENT:     Head: Normocephalic and atraumatic.     Right Ear: External ear normal.     Left Ear: External ear normal.     Nose: Nose normal.     Mouth/Throat:     Mouth: Mucous membranes are moist.     Pharynx: Oropharynx is clear.  Eyes:     Extraocular  Movements: Extraocular movements intact.  Cardiovascular:     Rate and Rhythm: Normal rate and regular rhythm.     Pulses: Normal pulses.     Heart sounds: Normal heart sounds.  Pulmonary:     Effort: Pulmonary effort is normal. No respiratory distress.     Breath sounds: Normal breath sounds. No wheezing, rhonchi or rales.  Musculoskeletal:        General: Normal range of motion.     Cervical back: Normal range of motion.     Right lower leg: No edema.     Left lower leg: No edema.  Lymphadenopathy:     Cervical: No cervical adenopathy.  Skin:    General: Skin is warm and dry.     Comments: Dressing in place to left lateral ankle  Neurological:     General: No focal deficit present.     Mental Status: He is alert and oriented to person, place, and time.  Psychiatric:        Mood and Affect: Mood normal.        Behavior: Behavior normal.     No results found for any visits on 03/11/24.      Assessment & Plan:   Assessment and Plan Assessment & Plan Chronic non-healing ulcer of left ankle/foot Chronic ulcer on left ankle/foot improved with current treatment, showing pink new skin. - Use device for blood flow improvement, covered by insurance.  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 Previous treatments ineffective, steroid injection relieved rash but not pruritus. - Administer steroid injection for pruritus. - Review previous antibiotic treatments for adverse reactions.  Immunization due Routine health maintenance discussed, including immunizations. - Administer flu shot.  Follow-Up Follow-up plan discussed for ongoing care and monitoring. - Schedule physical exam in six months. - Contact provider if issues arise before follow-up.     Orders Placed This Encounter  Procedures   Flu vaccine HIGH DOSE PF(Fluzone Trivalent)     Meds ordered this encounter  Medications   methylPREDNISolone  acetate (DEPO-MEDROL ) injection 40 mg    Return in about 6 months (around 09/08/2024) for cpe.  Corean LITTIE Ku, FNP

## 2024-03-17 DIAGNOSIS — Z23 Encounter for immunization: Secondary | ICD-10-CM | POA: Insufficient documentation

## 2024-03-26 ENCOUNTER — Ambulatory Visit: Admitting: Family

## 2024-03-26 DIAGNOSIS — L03116 Cellulitis of left lower limb: Secondary | ICD-10-CM | POA: Diagnosis not present

## 2024-03-26 MED ORDER — CIPROFLOXACIN HCL 500 MG PO TABS: 500.0000 mg | ORAL_TABLET | Freq: Two times a day (BID) | ORAL | 0 refills | Status: DC

## 2024-03-28 ENCOUNTER — Encounter: Payer: Self-pay | Admitting: Family

## 2024-03-28 NOTE — Progress Notes (Signed)
 Office Visit Note   Patient: [name] Fernandez           Date of Birth: [birth year]           MRN: [id number] Visit Date: 03/26/2024              Requested by: No referring provider defined for this encounter. PCP: Patient, No Pcp Per  Chief Complaint  Patient presents with   Left Ankle - Wound Check      HPI: The patient is an 83 year old gentleman who presents in follow-up for nonhealing ulcer to the left lateral leg today this is increased in size he has continued with Vashe to dry dressing changes reports that he is changing the dressing 3 or more times a day due to excessive drainage has noted some green drainage.  Denies fever or chill  Had been using the lymphedema pumps an hour a day for the last 1 week  Assessment & Plan: Visit Diagnoses:  1. Cellulitis of left ankle     Plan: Placed on a course of ciprofloxacin.  He will continue Vashe to dry dressing changes reinforced elevating from the edema  Follow-Up Instructions: Return in about 1 week (around 04/02/2024).   Ortho Exam  Patient is alert, oriented, no adenopathy, well-dressed, normal affect, normal respiratory effort. On examination left lateral ankle after debridement with Vashe soaked gauze there is healthy granulation in the wound bed the wound is measuring 6-1/2 x 5 cm now there is scant fibrinous exudative tissues there is minimal surrounding maceration no ascending cellulitis   Distal macerated tissue green in color, green exudate on bandages   Imaging: No results found.   Labs: Lab Results  Component Value Date   HGBA1C 6.2 (H) 09/22/2023   HGBA1C 6.5 (H) 04/10/2023   HGBA1C 6.3 (H) 10/05/2022   ESRSEDRATE 34 (H) 01/15/2023   ESRSEDRATE 32 (H) 06/15/2022   ESRSEDRATE 40 (H) 06/14/2022   CRP 0.7 01/15/2023   CRP 0.8 06/15/2022   CRP <0.5 06/14/2022   LABURIC 7.8 05/20/2014   LABURIC 6.8 05/20/2013   REPTSTATUS 01/17/2023 FINAL 01/12/2023   GRAMSTAIN NO WBC SEEN NO ORGANISMS SEEN   01/12/2023   CULT  01/12/2023    No growth aerobically or anaerobically. Performed at [facility], [address], [place], [place] [address]    LABORGA PSEUDOMONAS AERUGINOSA 11/10/2022   LABORGA ENTEROCOCCUS FAECALIS 11/10/2022     Lab Results  Component Value Date   ALBUMIN 3.9 09/21/2023   ALBUMIN 2.9 (L) 06/16/2022   ALBUMIN 3.8 06/14/2022   PREALBUMIN 18 06/14/2022    Lab Results  Component Value Date   MG 1.8 04/10/2023   MG 1.8 06/19/2022   MG 1.9 04/04/2022   Lab Results  Component Value Date   VD25OH 45 04/10/2023   VD25OH 57 10/05/2022   VD25OH 59 04/04/2022    Lab Results  Component Value Date   PREALBUMIN 18 06/14/2022      Latest Ref Rng & Units 12/06/2023    2:24 PM 09/21/2023    1:24 PM 04/10/2023    3:22 PM  CBC EXTENDED  WBC 4.0 - 10.5 K/uL 7.6  8.3  8.3   RBC 4.22 - 5.81 MIL/uL 3.54  3.84  3.65   Hemoglobin 13.0 - 17.0 g/dL 89.3  88.8  88.6   HCT 39.0 - 52.0 % 33.3  33.4  34.3   Platelets 150 - 400 K/uL 541  451  474   NEUT# 1,500 -  7,800 cells/uL   4,382      There is no height or weight on file to calculate BMI.  Orders:  No orders of the defined types were placed in this encounter.  Meds ordered this encounter  Medications   ciprofloxacin (CIPRO) 500 MG tablet    Sig: Take 1 tablet (500 mg total) by mouth 2 (two) times daily.    Dispense:  28 tablet    Refill:  0     Procedures: No procedures performed  Clinical Data: No additional findings.  ROS:  All other systems negative, except as noted in the HPI. Review of Systems  Objective: Vital Signs: There were no vitals taken for this visit.  Specialty Comments:  No specialty comments available.  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) 06/16/2022   Osteomyelitis (HCC) 06/16/2022   Tenosynovitis of tibialis posterior tendon 06/15/2022   Wound infection 06/15/2022   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   Anemia 03/13/2019   Chronic venous insufficiency 03/12/2019   CKD (chronic kidney disease) stage 2, 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/2015   COPD (chronic obstructive pulmonary disease) (HCC) 12/08/2015   Chronic pain syndrome 05/26/2015   DDD (degenerative disc disease), lumbar 05/26/2015   Medication management 05/26/2015   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   Lumbago with sciatica 08/06/2014   Hyperlipidemia 08/20/2013   Essential hypertension    Abnormal glucose    Vitamin D  deficiency    Arthritis    Vitamin B12 deficiency    Celiac disease 06/19/2012   GERD (gastroesophageal reflux disease) 04/30/2012   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 tube) procedure 04/30/2012   Hypothyroidism 04/30/2012   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   Past Medical History:  Diagnosis Date   Anemia    Celiac disease    diagnoses 06/24/12   Chronic ulcer of ankle (HCC)    left   COPD (chronic obstructive pulmonary disease) (HCC)    Esophageal stricture    GERD (gastroesophageal reflux disease)    Hiatal hernia    Hypertension    patient states  no current HTN   Hypothyroidism    Pre-diabetes    Prediabetes    Shortness of breath    from oxycodone  at times   Vitamin B12 deficiency    Vitamin D  deficiency     Family History  Problem Relation Age of Onset   Esophageal cancer Father    Cancer Father    Early death Mother     Past Surgical History:  Procedure Laterality Date   APPENDECTOMY     APPLICATION OF WOUND VAC Left 01/12/2023   Procedure: APPLICATION OF WOUND VAC;  Surgeon: Harden Jerona GAILS, MD;  Location: [facility] OR;  Service:  Orthopedics;  Laterality: Left;   BACK SURGERY     3 diff surgeries for vertebrea broken   CYSTOSCOPY W/ RETROGRADES Bilateral 12/11/2023   Procedure: CYSTOSCOPY, WITH RETROGRADE PYELOGRAM;  Surgeon: Shane Steffan BROCKS, MD;  Location: [facility]S;  Service: Urology;  Laterality: Bilateral;   EYE SURGERY     bilateral cataract surgery   HERNIA REPAIR  02/10/1949   bilateral groin as child   HIATAL HERNIA REPAIR     I & D EXTREMITY Left 06/16/2022   Procedure: IRRIGATION AND DEBRIDEMENT ANKLE;  Surgeon: Harden Jerona GAILS, MD;  Location: [facility] OR;  Service: Orthopedics;  Laterality: Left;   I & D EXTREMITY Left 07/21/2022   Procedure: LEFT ANKLE DEBRIDEMENT;  Surgeon: Harden Jerona GAILS, MD;  Location: [facility] OR;  Service: Orthopedics;  Laterality: Left;   I & D EXTREMITY Left 11/10/2022   Procedure: LEFT ANKLE DEBRIDEMENT;  Surgeon: Harden Jerona GAILS, MD;  Location: [facility] OR;  Service: Orthopedics;  Laterality: Left;   I & D EXTREMITY Left 01/12/2023   Procedure: LEFT ANKLE DEBRIDEMENT;  Surgeon: Harden Jerona GAILS, MD;  Location: [facility] OR;  Service: Orthopedics;  Laterality: Left;   JOINT REPLACEMENT  04/12/2010   right knee replacement   TONSILLECTOMY     as child   TOTAL KNEE REVISION  11/20/2011   Procedure: TOTAL KNEE REVISION;  Surgeon: Donnice JONETTA Car, MD;  Location: [facility];  Service: Orthopedics;  Laterality: Right;  Right Total Knee Revision   Social History   Occupational History   Occupation: Retired    Associate Professor: [employer]  Tobacco Use   Smoking status: Former    Current packs/day: 0.00    Average packs/day: 1.2 packs/day for 31.7 years (37.6 ttl pk-yrs)    Types: Cigarettes    Start date: 06/27/1960    Quit date: 06/27/1980    Years since quitting: 43.7    Passive exposure: Past   Smokeless tobacco: Never  Vaping Use   Vaping status: Never Used  Substance and Sexual Activity   Alcohol use: No   Drug use: No   Sexual activity: Not Currently

## 2024-04-01 ENCOUNTER — Ambulatory Visit (INDEPENDENT_AMBULATORY_CARE_PROVIDER_SITE_OTHER): Admitting: Family

## 2024-04-01 DIAGNOSIS — T148XXA Other injury of unspecified body region, initial encounter: Secondary | ICD-10-CM

## 2024-04-01 DIAGNOSIS — L089 Local infection of the skin and subcutaneous tissue, unspecified: Secondary | ICD-10-CM

## 2024-04-01 NOTE — Progress Notes (Signed)
 Reports wound looking better. No longer having green drainage.  Patient not seen. Will follow up as scheduled for PRP application in 1 week

## 2024-04-08 ENCOUNTER — Encounter: Payer: Self-pay | Admitting: Orthopedic Surgery

## 2024-04-08 ENCOUNTER — Ambulatory Visit (INDEPENDENT_AMBULATORY_CARE_PROVIDER_SITE_OTHER): Admitting: Orthopedic Surgery

## 2024-04-08 DIAGNOSIS — L97323 Non-pressure chronic ulcer of left ankle with necrosis of muscle: Secondary | ICD-10-CM

## 2024-04-08 DIAGNOSIS — L089 Local infection of the skin and subcutaneous tissue, unspecified: Secondary | ICD-10-CM

## 2024-04-08 DIAGNOSIS — I890 Lymphedema, not elsewhere classified: Secondary | ICD-10-CM

## 2024-04-08 DIAGNOSIS — L03116 Cellulitis of left lower limb: Secondary | ICD-10-CM

## 2024-04-08 DIAGNOSIS — L97322 Non-pressure chronic ulcer of left ankle with fat layer exposed: Secondary | ICD-10-CM

## 2024-04-08 DIAGNOSIS — T148XXA Other injury of unspecified body region, initial encounter: Secondary | ICD-10-CM | POA: Diagnosis not present

## 2024-04-08 NOTE — Progress Notes (Signed)
 Office Visit Note   Patient: [name] Fernandez           Date of Birth: 04/17/1941           MRN: [id number] Visit Date: 04/08/2024              Requested by: No referring provider defined for this encounter. PCP: Patient, No Pcp Per  Chief Complaint  Patient presents with   Left Ankle - Wound Check      HPI: Discussed the use of AI scribe software for clinical note transcription with the patient, who gave verbal consent to proceed.  History of Present Illness [name] Fernandez is an 83 year old male who presents with worsening leg ulcers and skin changes.  He initially noticed a wound on his leg that developed green drainage. He was prescribed antibiotics, possibly Cipro, which he took for one week out of a two-week course. The green drainage has since resolved, but he is holding onto the remaining antibiotics in case the symptoms return.  Currently, he has two new ulcers on the front of his ankle, along with skin color changes on the inside of the ankle and pitting edema in the leg. He is using an antibacterial dressing on the wound.  He experiences significant pain in his hip after using a pump device that extends up to his hip, although he does not feel any swelling in the hip. The pain makes it difficult to walk and requires him to take it easy. He notes redness in the leg.  He is renting a device for $37 a month, which he believes controls the swelling, and he is purchasing leggings separately. He is unsure about the renewal process for the device rental.     Assessment & Plan: Visit Diagnoses:  1. Wound infection   2. Cellulitis of left ankle   3. 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 Lymphedema   5. Ankle ulcer, left, with necrosis of muscle (HCC)     Plan: Assessment and Plan Assessment & Plan Chronic non-healing lower leg ulcer with suspected biofilm and prior Pseudomonas infection Ulcer size increased to 6.5 x 5.5 cm. Suspected biofilm due  to green drainage. Tissue unhealthy. Surgical intervention required. - Schedule surgical debridement to remove biofilm. - Admit for 3-5 days for IV antibiotics post-debridement. - Discharge on oral antibiotics after culture results.  Pitting edema of lower leg Compression device not adequately managing edema. - Continue use of compression device to manage edema.  Hip pain likely secondary to compression device use Hip pain likely muscular due to compression device pressure. - Monitor hip pain and adjust compression device usage as needed.      Follow-Up Instructions: Return in about 2 weeks (around 04/22/2024).   Ortho Exam  Patient is alert, oriented, no adenopathy, well-dressed, normal affect, normal respiratory effort. Physical Exam EXTREMITIES: Strong palpable pulse. SKIN: Wound measures 6.5 x 5.5 cm. Two new ulcers on the front of the ankle. Skin color changes on the inside of the ankle. Pitting edema on the leg. Calf circumference 40 cm. Leg is tight and swollen.      Imaging: No results found. No images are attached to the encounter.  Labs: Lab Results  Component Value Date   HGBA1C 6.2 (H) 09/22/2023   HGBA1C 6.5 (H) 04/10/2023   HGBA1C 6.3 (H) 10/05/2022   ESRSEDRATE 34 (H) 01/15/2023   ESRSEDRATE 32 (H) 06/15/2022   ESRSEDRATE 40 (H) 06/14/2022  CRP 0.7 01/15/2023   CRP 0.8 06/15/2022   CRP <0.5 06/14/2022   LABURIC 7.8 05/20/2014   LABURIC 6.8 05/20/2013   REPTSTATUS 01/17/2023 FINAL 01/12/2023   GRAMSTAIN NO WBC SEEN NO ORGANISMS SEEN  01/12/2023   CULT  01/12/2023    No growth aerobically or anaerobically. Performed at [facility], [address], [place], [place] [address]    LABORGA PSEUDOMONAS AERUGINOSA 11/10/2022   LABORGA ENTEROCOCCUS FAECALIS 11/10/2022     Lab Results  Component Value Date   ALBUMIN 3.9 09/21/2023   ALBUMIN 2.9 (L) 06/16/2022   ALBUMIN 3.8 06/14/2022   PREALBUMIN 18 06/14/2022    Lab Results  Component  Value Date   MG 1.8 04/10/2023   MG 1.8 06/19/2022   MG 1.9 04/04/2022   Lab Results  Component Value Date   VD25OH 45 04/10/2023   VD25OH 57 10/05/2022   VD25OH 59 04/04/2022    Lab Results  Component Value Date   PREALBUMIN 18 06/14/2022      Latest Ref Rng & Units 12/06/2023    2:24 PM 09/21/2023    1:24 PM 04/10/2023    3:22 PM  CBC EXTENDED  WBC 4.0 - 10.5 K/uL 7.6  8.3  8.3   RBC 4.22 - 5.81 MIL/uL 3.54  3.84  3.65   Hemoglobin 13.0 - 17.0 g/dL 89.3  88.8  88.6   HCT 39.0 - 52.0 % 33.3  33.4  34.3   Platelets 150 - 400 K/uL 541  451  474   NEUT# 1,500 - 7,800 cells/uL   4,382      There is no height or weight on file to calculate BMI.  Orders:  No orders of the defined types were placed in this encounter.  No orders of the defined types were placed in this encounter.    Procedures: No procedures performed  Clinical Data: No additional findings.  ROS:  All other systems negative, except as noted in the HPI. Review of Systems  Objective: Vital Signs: There were no vitals taken for this visit.  Specialty Comments:  No specialty comments available.  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/16/2022   Osteomyelitis (HCC) 06/16/2022   Tenosynovitis of tibialis posterior tendon 06/15/2022   Wound infection 06/15/2022   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   Anemia 03/13/2019   Chronic venous insufficiency 03/12/2019   CKD (chronic kidney disease) stage 2, 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/2015   COPD (chronic obstructive pulmonary disease) (HCC)  12/08/2015   Chronic pain syndrome 05/26/2015   DDD (degenerative disc disease), lumbar 05/26/2015   Medication management 05/26/2015   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   Lumbago with sciatica 08/06/2014   Hyperlipidemia 08/20/2013   Essential hypertension    Abnormal glucose    Vitamin D  deficiency    Arthritis    Vitamin B12 deficiency    Celiac disease 06/19/2012   GERD (gastroesophageal reflux disease) 04/30/2012   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 tube) procedure 04/30/2012   Hypothyroidism 04/30/2012   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   Past Medical History:  Diagnosis Date   Anemia    Celiac disease    diagnoses 06/24/12   Chronic ulcer of ankle (HCC)    left   COPD (chronic obstructive pulmonary disease) (HCC)    Esophageal stricture    GERD (gastroesophageal reflux disease)    Hiatal hernia    Hypertension    patient states  no current HTN   Hypothyroidism    Pre-diabetes    Prediabetes    Shortness of breath    from oxycodone  at times   Vitamin B12 deficiency    Vitamin D  deficiency     Family History  Problem Relation Age of Onset   Esophageal cancer Father    Cancer Father    Early death Mother     Past Surgical History:  Procedure Laterality Date   APPENDECTOMY     APPLICATION OF WOUND VAC Left 01/12/2023   Procedure: APPLICATION OF WOUND VAC;  Surgeon: Harden Jerona GAILS, MD;  Location: [facility] OR;  Service: Orthopedics;  Laterality: Left;   BACK SURGERY     3 diff surgeries for vertebrea broken   CYSTOSCOPY W/ RETROGRADES Bilateral 12/11/2023   Procedure: CYSTOSCOPY, WITH RETROGRADE PYELOGRAM;  Surgeon: Shane Steffan BROCKS, MD;  Location: [facility];  Service: Urology;  Laterality: Bilateral;   EYE SURGERY     bilateral cataract surgery   HERNIA REPAIR  02/10/1949   bilateral groin as child   HIATAL HERNIA REPAIR     I & D EXTREMITY Left 06/16/2022   Procedure: IRRIGATION AND DEBRIDEMENT ANKLE;  Surgeon: Harden Jerona GAILS, MD;  Location: [facility] OR;   Service: Orthopedics;  Laterality: Left;   I & D EXTREMITY Left 07/21/2022   Procedure: LEFT ANKLE DEBRIDEMENT;  Surgeon: Harden Jerona GAILS, MD;  Location: [facility] OR;  Service: Orthopedics;  Laterality: Left;   I & D EXTREMITY Left 11/10/2022   Procedure: LEFT ANKLE DEBRIDEMENT;  Surgeon: Harden Jerona GAILS, MD;  Location: [facility] OR;  Service: Orthopedics;  Laterality: Left;   I & D EXTREMITY Left 01/12/2023   Procedure: LEFT ANKLE DEBRIDEMENT;  Surgeon: Harden Jerona GAILS, MD;  Location: [facility] OR;  Service: Orthopedics;  Laterality: Left;   JOINT REPLACEMENT  04/12/2010   right knee replacement   TONSILLECTOMY     as child   TOTAL KNEE REVISION  11/20/2011   Procedure: TOTAL KNEE REVISION;  Surgeon: Donnice JONETTA Car, MD;  Location: [facility];  Service: Orthopedics;  Laterality: Right;  Right Total Knee Revision   Social History   Occupational History   Occupation: Retired    Associate Professor: [employer]  Tobacco Use   Smoking status: Former    Current packs/day: 0.00    Average packs/day: 1.2 packs/day for 31.7 years (37.6 ttl pk-yrs)    Types: Cigarettes    Start date: 06/27/1960    Quit date: 06/27/1980    Years since quitting: 43.8    Passive exposure: Past   Smokeless tobacco: Never  Vaping Use   Vaping status: Never Used  Substance and Sexual Activity   Alcohol use: No   Drug use: No   Sexual activity: Not Currently

## 2024-04-10 ENCOUNTER — Telehealth: Payer: Self-pay | Admitting: Orthopedic Surgery

## 2024-04-10 NOTE — Telephone Encounter (Signed)
 FYI: [name] Fernandez was supposed to have left ankle debridement tomorrow at [facility].  He called to let me know that he did not think he was up to it.  He is feeling very weak and does not wish to proceed with surgery tomorrow. He did not mention any other symptoms other than that he didn't feel well in general.  He asked that I reschedule for next week.

## 2024-04-10 NOTE — H&P (Signed)
 [name] Fernandez is an 83 y.o. male.   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   He initially noticed a wound on his leg that developed green drainage. He was prescribed antibiotics, possibly Cipro, which he took for one week out of a two-week course. The green drainage has since resolved, but he is holding onto the remaining antibiotics in case the symptoms return.   Currently, he has two new ulcers on the front of his ankle, along with skin color changes on the inside of the ankle and pitting edema in the leg. He is using an antibacterial dressing on the wound.   He experiences significant pain in his hip after using a pump device that extends up to his hip, although he does not feel any swelling in the hip. The pain makes it difficult to walk and requires him to take it easy. He notes redness in the leg.   Past Medical History:  Diagnosis Date   Anemia    Celiac disease    diagnoses 06/24/12   Chronic ulcer of ankle (HCC)    left   COPD (chronic obstructive pulmonary disease) (HCC)    Esophageal stricture    GERD (gastroesophageal reflux disease)    Hiatal hernia    Hypertension    patient states  no current HTN   Hypothyroidism    Pre-diabetes    Prediabetes    Shortness of breath    from oxycodone  at times   Vitamin B12 deficiency    Vitamin D  deficiency     Past Surgical History:  Procedure Laterality Date   APPENDECTOMY     APPLICATION OF WOUND VAC Left 01/12/2023   Procedure: APPLICATION OF WOUND VAC;  Surgeon: Harden Jerona GAILS, MD;  Location: [facility] OR;  Service: Orthopedics;  Laterality: Left;   BACK SURGERY     3 diff surgeries for vertebrea broken   CYSTOSCOPY W/ RETROGRADES Bilateral 12/11/2023   Procedure: CYSTOSCOPY, WITH RETROGRADE PYELOGRAM;  Surgeon: Shane Steffan JAYSON, MD;  Location: [facility];  Service: Urology;  Laterality:  Bilateral;   EYE SURGERY     bilateral cataract surgery   HERNIA REPAIR  02/10/1949   bilateral groin as child   HIATAL HERNIA REPAIR     I & D EXTREMITY Left 06/16/2022   Procedure: IRRIGATION AND DEBRIDEMENT ANKLE;  Surgeon: Harden Jerona GAILS, MD;  Location: [facility] OR;  Service: Orthopedics;  Laterality: Left;   I & D EXTREMITY Left 07/21/2022   Procedure: LEFT ANKLE DEBRIDEMENT;  Surgeon: Harden Jerona GAILS, MD;  Location: [facility] OR;  Service: Orthopedics;  Laterality: Left;   I & D EXTREMITY Left 11/10/2022   Procedure: LEFT ANKLE DEBRIDEMENT;  Surgeon: Harden Jerona GAILS, MD;  Location: [facility] OR;  Service: Orthopedics;  Laterality: Left;   I & D EXTREMITY Left 01/12/2023   Procedure: LEFT ANKLE DEBRIDEMENT;  Surgeon: Harden Jerona GAILS, MD;  Location: [facility] OR;  Service: Orthopedics;  Laterality: Left;   JOINT REPLACEMENT  04/12/2010   right knee replacement   TONSILLECTOMY     as child   TOTAL KNEE REVISION  11/20/2011   Procedure: TOTAL KNEE REVISION;  Surgeon: Donnice JONETTA Car, MD;  Location: [facility];  Service: Orthopedics;  Laterality: Right;  Right Total Knee Revision    Family History  Problem Relation Age of Onset  Esophageal cancer Father    Cancer Father    Early death Mother    Social History:  reports that he quit smoking about 43 years ago. His smoking use included cigarettes. He started smoking about 63 years ago. He has a 37.6 pack-year smoking history. He has been exposed to tobacco smoke. He has never used smokeless tobacco. He reports that he does not drink alcohol and does not use drugs.  Allergies:  Allergies  Allergen Reactions   Feraheme  [Ferumoxytol ] Swelling and Other (See Comments)    Swelling of lips and tongue, could not talk 30 minutes after the infusion.   Latex Other (See Comments)    Latex butterfly bandages tore off the skin and caused terrible blisters   Sulfa  Antibiotics Hives    Severe itching, blisters   Wound Dressing Adhesive Other (See Comments)    Latex butterfly  bandages tore off the skin and caused terrible blisters   Levothyroxine  Swelling and Other (See Comments)    Lip swelling   Doxycycline  Nausea Only    No medications prior to admission.    No results found for this or any previous visit (from the past 48 hours). No results found.  Review of Systems  All other systems reviewed and are negative.   There were no vitals taken for this visit. Physical Exam  Patient is alert  Oriented  no adenopathy  well-dressed  normal affect  normal respiratory effort. Physical Exam EXTREMITIES: Strong palpable pulse. SKIN: Wound measures 6.5 x 5.5 cm. Two new ulcers on the front of the ankle. Skin color changes on the inside of the ankle. Pitting edema on the leg. Calf circumference 40 cm. Leg is tight and swollen.    Assessment/Plan Visit Diagnoses:  1. Wound infection   2. Cellulitis of left ankle   3. 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 Lymphedema   5. Ankle ulcer, left, with necrosis of muscle (HCC)        Assessment and Plan:  Chronic non-healing lower leg ulcer with suspected biofilm and prior Pseudomonas infection Ulcer size increased to 6.5 x 5.5 cm. Suspected biofilm due to green drainage. Tissue unhealthy. Surgical intervention required. - Schedule surgical debridement to remove biofilm. - Admit for 3-5 days for IV antibiotics post-debridement. - Discharge on oral antibiotics after culture results.   Pitting edema of lower leg Compression device not adequately managing edema. - Continue use of compression device to manage edema.   Hip pain likely secondary to compression device use Hip pain likely muscular due to compression device pressure. - Monitor hip pain and adjust compression device usage as needed.             Maurilio Deland Collet, PA-C 04/10/2024, 9:57 AM

## 2024-04-14 ENCOUNTER — Encounter: Payer: Self-pay | Admitting: Radiology

## 2024-04-15 ENCOUNTER — Ambulatory Visit: Admitting: Orthopedic Surgery

## 2024-04-15 DIAGNOSIS — L97322 Non-pressure chronic ulcer of left ankle with fat layer exposed: Secondary | ICD-10-CM | POA: Diagnosis not present

## 2024-04-16 ENCOUNTER — Encounter: Payer: Self-pay | Admitting: Orthopedic Surgery

## 2024-04-16 NOTE — Progress Notes (Signed)
 Office Visit Note   Patient: [name] Fernandez           Date of Birth: 1940/06/20           MRN: [id number] Visit Date: 04/15/2024              Requested by: No referring provider defined for this encounter. PCP: Patient, No Pcp Per  Chief Complaint  Patient presents with   Left Ankle - Wound Check      HPI: Discussed the use of AI scribe software for clinical note transcription with the patient, who gave verbal consent to proceed.  History of Present Illness [name] Fernandez is an 83 year old male with a history of bladder cancer who presents with wound care management.  He is managing two small wound areas, which have shown improvement since the last visit, now measuring six by four centimeters. He has been using a tube of iodine -like medication, described as orange putty, and alternates this with another dressing.  He is using a lymphedema pump to manage edema.  He feels 'burned out' and is dealing with multiple stressors, including his cancer diagnosis. He has had to cancel previous appointments due to his mental state.     Assessment & Plan: Visit Diagnoses: No diagnosis found.  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 Edema and varicose veins present with minor bleeding into the wound. - Continue Vashe dressing changes. - Alternate with Iodosorb dressing changes. - Re-evaluate in four weeks.      Follow-Up Instructions: No follow-ups on file.   Ortho Exam  Patient is alert, oriented, no adenopathy, well-dressed, normal affect, normal respiratory effort. Physical Exam EXTREMITIES: Pitting edema present, using lymphedema pump. Significant swelling from varicose veins. SKIN: Small bleeding into wound from varicose vein. Wound size 6x4 cm, smaller than before.      Imaging: No results found. No images are attached to the encounter.  Labs: Lab  Results  Component Value Date   HGBA1C 6.2 (H) 09/22/2023   HGBA1C 6.5 (H) 04/10/2023   HGBA1C 6.3 (H) 10/05/2022   ESRSEDRATE 34 (H) 01/15/2023   ESRSEDRATE 32 (H) 06/15/2022   ESRSEDRATE 40 (H) 06/14/2022   CRP 0.7 01/15/2023   CRP 0.8 06/15/2022   CRP <0.5 06/14/2022   LABURIC 7.8 05/20/2014   LABURIC 6.8 05/20/2013   REPTSTATUS 01/17/2023 FINAL 01/12/2023   GRAMSTAIN NO WBC SEEN NO ORGANISMS SEEN  01/12/2023   CULT  01/12/2023    No growth aerobically or anaerobically. Performed at [facility], [address], [place], [place] [address]    LABORGA PSEUDOMONAS AERUGINOSA 11/10/2022   LABORGA ENTEROCOCCUS FAECALIS 11/10/2022     Lab Results  Component Value Date   ALBUMIN 3.9 09/21/2023   ALBUMIN 2.9 (L) 06/16/2022   ALBUMIN 3.8 06/14/2022   PREALBUMIN 18 06/14/2022    Lab Results  Component Value Date   MG 1.8 04/10/2023   MG 1.8 06/19/2022   MG 1.9 04/04/2022   Lab Results  Component Value Date   VD25OH 45 04/10/2023   VD25OH 57 10/05/2022   VD25OH 59 04/04/2022    Lab Results  Component Value Date   PREALBUMIN 18 06/14/2022      Latest Ref Rng & Units 12/06/2023    2:24 PM 09/21/2023    1:24 PM 04/10/2023    3:22 PM  CBC EXTENDED  WBC  4.0 - 10.5 K/uL 7.6  8.3  8.3   RBC 4.22 - 5.81 MIL/uL 3.54  3.84  3.65   Hemoglobin 13.0 - 17.0 g/dL 89.3  88.8  88.6   HCT 39.0 - 52.0 % 33.3  33.4  34.3   Platelets 150 - 400 K/uL 541  451  474   NEUT# 1,500 - 7,800 cells/uL   4,382      There is no height or weight on file to calculate BMI.  Orders:  No orders of the defined types were placed in this encounter.  No orders of the defined types were placed in this encounter.    Procedures: No procedures performed  Clinical Data: No additional findings.  ROS:  All other systems negative, except as noted in the HPI. Review of Systems  Objective: Vital Signs: There were no vitals taken for this visit.  Specialty Comments:  No specialty  comments available.  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/16/2022   Osteomyelitis (HCC) 06/16/2022   Tenosynovitis of tibialis posterior tendon 06/15/2022   Wound infection 06/15/2022   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   Anemia 03/13/2019   Chronic venous insufficiency 03/12/2019   CKD (chronic kidney disease) stage 2, 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/2015   COPD (chronic obstructive pulmonary disease) (HCC) 12/08/2015   Chronic pain syndrome 05/26/2015   DDD (degenerative disc disease), lumbar 05/26/2015   Medication management 05/26/2015   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   Lumbago with sciatica 08/06/2014   Hyperlipidemia 08/20/2013   Essential hypertension    Abnormal glucose    Vitamin D  deficiency    Arthritis    Vitamin B12 deficiency    Celiac disease 06/19/2012   GERD (gastroesophageal reflux disease) 04/30/2012   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 tube) procedure 04/30/2012   Hypothyroidism 04/30/2012   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   Past Medical History:  Diagnosis Date   Anemia    Celiac disease    diagnoses 06/24/12   Chronic ulcer of ankle (HCC)    left   COPD (chronic obstructive pulmonary disease) (HCC)    Esophageal stricture    GERD (gastroesophageal reflux disease)    Hiatal hernia    Hypertension    patient states  no current HTN   Hypothyroidism    Pre-diabetes    Prediabetes    Shortness of breath    from oxycodone  at times   Vitamin B12 deficiency    Vitamin D  deficiency     Family History  Problem Relation Age  of Onset   Esophageal cancer Father    Cancer Father    Early death Mother     Past Surgical History:  Procedure Laterality Date   APPENDECTOMY     APPLICATION OF WOUND VAC Left 01/12/2023   Procedure: APPLICATION OF WOUND VAC;  Surgeon: Harden Jerona GAILS, MD;  Location: [facility] OR;  Service: Orthopedics;  Laterality: Left;   BACK SURGERY     3 diff surgeries for vertebrea broken   CYSTOSCOPY W/ RETROGRADES Bilateral 12/11/2023   Procedure: CYSTOSCOPY, WITH RETROGRADE PYELOGRAM;  Surgeon:  Shane Steffan BROCKS, MD;  Location: [facility] ORS;  Service: Urology;  Laterality: Bilateral;   EYE SURGERY     bilateral cataract surgery   HERNIA REPAIR  02/10/1949   bilateral groin as child   HIATAL HERNIA REPAIR     I & D EXTREMITY Left 06/16/2022   Procedure: IRRIGATION AND DEBRIDEMENT ANKLE;  Surgeon: Harden Jerona GAILS, MD;  Location: [facility] OR;  Service: Orthopedics;  Laterality: Left;   I & D EXTREMITY Left 07/21/2022   Procedure: LEFT ANKLE DEBRIDEMENT;  Surgeon: Harden Jerona GAILS, MD;  Location: [facility];  Service: Orthopedics;  Laterality: Left;   I & D EXTREMITY Left 11/10/2022   Procedure: LEFT ANKLE DEBRIDEMENT;  Surgeon: Harden Jerona GAILS, MD;  Location: [facility] OR;  Service: Orthopedics;  Laterality: Left;   I & D EXTREMITY Left 01/12/2023   Procedure: LEFT ANKLE DEBRIDEMENT;  Surgeon: Harden Jerona GAILS, MD;  Location: [facility] OR;  Service: Orthopedics;  Laterality: Left;   JOINT REPLACEMENT  04/12/2010   right knee replacement   TONSILLECTOMY     as child   TOTAL KNEE REVISION  11/20/2011   Procedure: TOTAL KNEE REVISION;  Surgeon: Donnice JONETTA Car, MD;  Location: [facility] ORS;  Service: Orthopedics;  Laterality: Right;  Right Total Knee Revision   Social History   Occupational History   Occupation: Retired    Associate Professor: [employer]  Tobacco Use   Smoking status: Former    Current packs/day: 0.00    Average packs/day: 1.2 packs/day for 31.7 years (37.6 ttl pk-yrs)    Types: Cigarettes    Start date: 06/27/1960    Quit  date: 06/27/1980    Years since quitting: 43.8    Passive exposure: Past   Smokeless tobacco: Never  Vaping Use   Vaping status: Never Used  Substance and Sexual Activity   Alcohol use: No   Drug use: No   Sexual activity: Not Currently

## 2024-04-18 ENCOUNTER — Ambulatory Visit: Admission: RE | Admit: 2024-04-18 | Source: Home / Self Care | Admitting: Orthopedic Surgery

## 2024-04-18 ENCOUNTER — Encounter: Admission: RE | Payer: Self-pay | Source: Home / Self Care

## 2024-04-18 DIAGNOSIS — S81802A Unspecified open wound, left lower leg, initial encounter: Secondary | ICD-10-CM

## 2024-04-18 SURGERY — INCISION AND DRAINAGE OF DEEP ABSCESS, CALF
Anesthesia: Choice | Laterality: Left

## 2024-04-22 ENCOUNTER — Telehealth: Payer: Self-pay | Admitting: Orthopedic Surgery

## 2024-04-22 NOTE — Telephone Encounter (Signed)
 Cyhia from [facility] called saying that she faxed over a letter of medical necessity for the pt's compression device and hasn't heard anything back. Call back number is [phone]

## 2024-04-23 NOTE — Telephone Encounter (Signed)
 This was faxed yesterday 04/22/24

## 2024-04-28 ENCOUNTER — Encounter: Payer: Medicare Other | Admitting: Internal Medicine

## 2024-05-05 ENCOUNTER — Emergency Department (HOSPITAL_BASED_OUTPATIENT_CLINIC_OR_DEPARTMENT_OTHER): Admitting: Radiology

## 2024-05-05 ENCOUNTER — Encounter (HOSPITAL_BASED_OUTPATIENT_CLINIC_OR_DEPARTMENT_OTHER): Payer: Self-pay

## 2024-05-05 ENCOUNTER — Emergency Department (HOSPITAL_BASED_OUTPATIENT_CLINIC_OR_DEPARTMENT_OTHER)

## 2024-05-05 ENCOUNTER — Other Ambulatory Visit: Payer: Self-pay

## 2024-05-05 ENCOUNTER — Emergency Department (HOSPITAL_BASED_OUTPATIENT_CLINIC_OR_DEPARTMENT_OTHER)
Admission: EM | Admit: 2024-05-05 | Discharge: 2024-05-05 | Disposition: A | Discharge status: Home / Self Care | Attending: Emergency Medicine | Admitting: Emergency Medicine

## 2024-05-05 DIAGNOSIS — X501XXA Overexertion from prolonged static or awkward postures, initial encounter: Secondary | ICD-10-CM | POA: Diagnosis not present

## 2024-05-05 DIAGNOSIS — Z91040 Latex allergy status: Secondary | ICD-10-CM | POA: Insufficient documentation

## 2024-05-05 DIAGNOSIS — S3992XA Unspecified injury of lower back, initial encounter: Secondary | ICD-10-CM | POA: Diagnosis not present

## 2024-05-05 DIAGNOSIS — E119 Type 2 diabetes mellitus without complications: Secondary | ICD-10-CM | POA: Insufficient documentation

## 2024-05-05 DIAGNOSIS — M48061 Spinal stenosis, lumbar region without neurogenic claudication: Secondary | ICD-10-CM | POA: Diagnosis not present

## 2024-05-05 DIAGNOSIS — M5441 Lumbago with sciatica, right side: Secondary | ICD-10-CM | POA: Insufficient documentation

## 2024-05-05 DIAGNOSIS — S32050A Wedge compression fracture of fifth lumbar vertebra, initial encounter for closed fracture: Secondary | ICD-10-CM | POA: Diagnosis not present

## 2024-05-05 DIAGNOSIS — N189 Chronic kidney disease, unspecified: Secondary | ICD-10-CM | POA: Diagnosis not present

## 2024-05-05 DIAGNOSIS — S32059A Unspecified fracture of fifth lumbar vertebra, initial encounter for closed fracture: Secondary | ICD-10-CM | POA: Insufficient documentation

## 2024-05-05 DIAGNOSIS — I129 Hypertensive chronic kidney disease with stage 1 through stage 4 chronic kidney disease, or unspecified chronic kidney disease: Secondary | ICD-10-CM | POA: Diagnosis not present

## 2024-05-05 DIAGNOSIS — M4800 Spinal stenosis, site unspecified: Secondary | ICD-10-CM | POA: Diagnosis not present

## 2024-05-05 DIAGNOSIS — J449 Chronic obstructive pulmonary disease, unspecified: Secondary | ICD-10-CM | POA: Insufficient documentation

## 2024-05-05 DIAGNOSIS — E039 Hypothyroidism, unspecified: Secondary | ICD-10-CM | POA: Diagnosis not present

## 2024-05-05 DIAGNOSIS — M47816 Spondylosis without myelopathy or radiculopathy, lumbar region: Secondary | ICD-10-CM | POA: Diagnosis not present

## 2024-05-05 DIAGNOSIS — M419 Scoliosis, unspecified: Secondary | ICD-10-CM | POA: Diagnosis not present

## 2024-05-05 DIAGNOSIS — M5126 Other intervertebral disc displacement, lumbar region: Secondary | ICD-10-CM | POA: Diagnosis not present

## 2024-05-05 MED ORDER — OXYCODONE-ACETAMINOPHEN 5-325 MG PO TABS
1.0000 | ORAL_TABLET | Freq: Once | ORAL | Status: AC
  Administered 2024-05-05: 1 via ORAL
  Filled 2024-05-05: qty 1

## 2024-05-05 MED ORDER — LIDOCAINE 5 % EX PTCH: 1.0000 | MEDICATED_PATCH | CUTANEOUS | 0 refills | Status: DC

## 2024-05-05 MED ORDER — LIDOCAINE 5 % EX PTCH
1.0000 | MEDICATED_PATCH | CUTANEOUS | Status: DC
  Administered 2024-05-05: 1 via TRANSDERMAL
  Filled 2024-05-05: qty 1

## 2024-05-05 MED ORDER — TIZANIDINE HCL 2 MG PO TABS
2.0000 mg | ORAL_TABLET | Freq: Once | ORAL | Status: AC
  Administered 2024-05-05: 2 mg via ORAL
  Filled 2024-05-05: qty 1

## 2024-05-05 MED ORDER — OXYCODONE HCL 5 MG PO TABS: 5.0000 mg | ORAL_TABLET | Freq: Four times a day (QID) | ORAL | 0 refills | Status: DC | PRN

## 2024-05-05 NOTE — ED Provider Notes (Signed)
 [place] EMERGENCY DEPARTMENT AT [facility] Provider Note   CSN: [id number] Arrival date & time: 05/05/24  1455     Patient presents with: Back Pain   [name] Fernandez is a 83 y.o. male who presents to the emergency department with a chief complaint of back pain.  Patient states that at times he feels like the back pain radiates down his right leg and his right gluteal area.  Patient denies known trauma or injury but states that he was active 1 day previously approximately 1 week ago prior to the pain starting where he was moving things around the house.  Denies falls.  Patient does have a significant history of previous back surgeries as well as compression fractures to his lumbar spine.  Patient states that he has not seen neurosurgery in quite some time.  Patient was previously on chronic opioid medications but weaned himself over time.  Denies groin numbness/tingling, bowel/bladder incontinence, fever, chills, IV drug use.  Patient states that the pain is worse with movement and limits his mobility.  Patient is currently walking with a cane.  Medical history significant for hypothyroidism, GERD, hypertension, hyperlipidemia, chronic pain syndrome, lumbago with sciatica, CKD, COPD, chronic venous insufficiency, anemia, chronic ulcer of left ankle, type 2 diabetes, osteomyelitis, etc. Patient also states that he recently was evaluated by urology for bladder cancer, patient states a procedure was performed where some of the cancer was removed, patient states that since then they have recommended possible bladder removal surgically or treatment and patient has deferred at this time    Back Pain      Prior to Admission medications   Medication Sig Start Date End Date Taking? Authorizing Provider  lidocaine  (LIDODERM ) 5 % Place 1 patch onto the skin daily. Remove & Discard patch within 12 hours or as directed by MD 05/05/24  Yes Etna Forquer F, PA-C  oxyCODONE  (ROXICODONE ) 5 MG  immediate release tablet Take 1 tablet (5 mg total) by mouth every 6 (six) hours as needed for breakthrough pain. 05/05/24  Yes Jeriel Vivanco F, PA-C  Ascorbic Acid (VITAMIN C) 1000 MG tablet Take 1,000 mg by mouth in the morning.    [provider]  betamethasone  dipropionate 0.05 % cream Apply 1 Application topically 2 (two) times daily as needed (skin rash/irritation.). 07/26/23   [provider]  ciprofloxacin  (CIPRO ) 500 MG tablet Take 1 tablet (500 mg total) by mouth 2 (two) times daily. 03/26/24   Valdemar Rocky SAUNDERS, NP  diphenhydrAMINE  (BENADRYL ) 25 MG tablet Take 50 mg by mouth at bedtime.    [provider]  doxepin  (SINEQUAN ) 10 MG capsule Take 10-20 mg by mouth at bedtime as needed (itching). 07/28/23   [provider]  hydrOXYzine  (ATARAX ) 25 MG tablet TAKE 1 TABLET 3 x / day  AS NEEDED FOR ANXIETY OR SLEEP                                                             /  TAKE                                         BY                                                 MOUTH 03/14/23   Tonita Fallow, MD  MAGNESIUM  PO Take 2 capsules by mouth at bedtime.    [provider]  MELATONIN PO Take 10 mg by mouth at bedtime.    [provider]  omeprazole  (PRILOSEC  OTC) 20 MG tablet Take 20 mg by mouth daily before breakfast.    [provider]  torsemide  (DEMADEX ) 20 MG tablet Take 2 tablets (40 mg total) by mouth daily as needed (For worsening swelling or more than 2 pound weight gain from your baseline.). Take 2 tablet  Daily for Fluid Retention / Leg Swelling 09/23/23   Ghimire, Donalda HERO, MD  triamcinolone  cream (KENALOG ) 0.1 % APPLY TO THE LEFT LEG UNDER THE WRAP DAILY WHERE THERE IS ITCHING OR RASH X 2 WEEKS OR UNTIL HEALED Patient taking differently: Apply 1 Application topically daily. 07/26/23   Harden Jerona GAILS, MD  Wound Cleansers (VASHE WOUND EX) Apply 1 Application  topically daily as needed (wound cleansing).    [provider]    Allergies: Feraheme  [ferumoxytol ], Latex, Sulfa  antibiotics, Wound dressing adhesive, Levothyroxine , and Doxycycline     Review of Systems  Musculoskeletal:  Positive for back pain.    Updated Vital Signs BP 130/67 (BP Location: Left Arm)   Pulse 85   Temp 98 F (36.7 C)   Resp 16   SpO2 96%   Physical Exam Vitals and nursing note reviewed.  Constitutional:      General: He is awake. He is not in acute distress.    Appearance: Normal appearance. He is not ill-appearing, toxic-appearing or diaphoretic.  HENT:     Head: Normocephalic and atraumatic.  Eyes:     General: No scleral icterus. Neck:     Comments: Patient able to look left, right, touch and to chest, and look up at the ceiling, no cervical spine tenderness Pulmonary:     Effort: Pulmonary effort is normal. No respiratory distress.  Musculoskeletal:        General: Normal range of motion.     Cervical back: Normal range of motion.     Right lower leg: No edema.     Left lower leg: No edema.     Comments: Grossly normal range of motion of all 4 extremities, patient able to raise bilateral upper and lower extremities up against gravity as well as resistance, equal grip strength bilaterally of upper extremities, bilateral lower extremities neurovascularly intact  Mild tenderness with palpation of right sided lumbar spine as well as right paraspinal muscles, majority of pain patient states he is experiencing is non-reproducible  No tenderness to palpation of thoracic spine  Skin:    General: Skin is warm.     Capillary Refill: Capillary refill takes less than 2 seconds.  Neurological:     General: No focal deficit present.     Mental Status: He is alert and oriented to person, place, and time.  Psychiatric:        Mood and Affect: Mood normal.  Behavior: Behavior normal. Behavior is cooperative.     (all labs ordered are listed,  but only abnormal results are displayed) Labs Reviewed - No data to display  EKG: None  Radiology: CT Lumbar Spine Wo Contrast Result Date: 05/05/2024 EXAM: CT OF THE LUMBAR SPINE WITHOUT CONTRAST 05/05/2024 06:33:08 PM TECHNIQUE: CT of the lumbar spine was performed without the administration of intravenous contrast. Multiplanar reformatted images are provided for review. Automated exposure control, iterative reconstruction, and/or weight based adjustment of the mA/kV was utilized to reduce the radiation dose to as low as reasonably achievable. COMPARISON: Radiographs 05/05/2024 and CT abdomen 09/21/2023. CLINICAL HISTORY: Low back pain, history of previous hardware. FINDINGS: BONES AND ALIGNMENT: Interval 65 percent compression fracture at L5 primarily along the superior endplate with 5 mm posterior bony retropulsion indicating middle column involvement, mild sclerosis in the vertebral body, and minimal residual superior endplate irregularity compatible with subacute chronicity. Remote endplate compression fractures at L2, L3, and L4. Normal vertebral body heights for other visualized levels. Normal alignment. No suspicious bone lesion. DEGENERATIVE CHANGES: Borderline central stenosis at L4-L5 due to disc bulge and posterior bony retropulsion. Remote endplate compression fractures at L2, L3, and L4. The L5 compression fracture is also a degenerative change. SOFT TISSUES: Systemic atherosclerosis is present, including the aorta and iliac arteries. No acute abnormality. IMPRESSION: 1. Subacute 65% compression fracture at L5 with 5 mm posterior bony retropulsion and mild sclerosis. 2. Borderline central stenosis at L4-L5 due to disc bulge and posterior bony retropulsion. 3. Remote endplate compression fractures at L2, L3, and L4. 4. Systemic atherosclerosis involving the aorta and iliac arteries. Electronically signed by: Ryan Salvage MD 05/05/2024 06:47 PM EST RP Workstation: [id number]   DG Lumbar  Spine Complete Result Date: 05/05/2024 EXAM: 4 VIEW(S) XRAY OF THE LUMBAR SPINE 05/05/2024 05:06:17 PM COMPARISON: MRI 01/24/2013. CT abdomen and pelvis for 11/25. CLINICAL HISTORY: back pain FINDINGS: LUMBAR SPINE: BONES: Interval moderate to severe compression fracture at L5, age indeterminate. Treated compression deformity at t10 andT11. Chronic superior endplate deformities at L2, L3, and L4. No aggressive appearing osseous lesion. Alignment demonstrates mild levoscoliosis and trace retrolisthesis L2 on L3. DISCS AND DEGENERATIVE CHANGES: Multilevel facet degenerative changes. SOFT TISSUES: Aortic atherosclerosis. Multiple round opacities overlying the right colon suggesting ingested material. IMPRESSION: 1. Interval moderate to severe compression fracture at L5, age indeterminate. 2. Colon Chronic superior endplate deformities at L2, L3, and L4. 3. Trace retrolisthesis of L2 on L3. 4. Mild multilevel degenerative changes. Electronically signed by: Luke Bun MD 05/05/2024 05:58 PM EST RP Workstation: [id number]     Procedures   Medications Ordered in the ED  lidocaine  (LIDODERM ) 5 % 1 patch (1 patch Transdermal Patch Applied 05/05/24 1819)  oxyCODONE -acetaminophen  (PERCOCET/ROXICET) 5-325 MG per tablet 1 tablet (1 tablet Oral Given 05/05/24 1819)  tiZANidine  (ZANAFLEX ) tablet 2 mg (2 mg Oral Given 05/05/24 1819)                                    Medical Decision Making Amount and/or Complexity of Data Reviewed Radiology: ordered.  Risk Prescription drug management.   Patient presents to the ED for concern of back pain, this involves an extensive number of treatment options, and is a complaint that carries with it a high risk of complications and morbidity.  The differential diagnosis includes sciatica, metastatic disease from cancer,   Co morbidities that complicate the patient evaluation  hypothyroidism, GERD,  hypertension, hyperlipidemia, chronic pain syndrome, lumbago with  sciatica, CKD, COPD, chronic venous insufficiency, anemia, chronic ulcer of left ankle, type 2 diabetes, osteomyelitis   Additional history obtained:  Reviewed where patient has been seen previously for back pain, also reviewed surgical history as well as latest urologic interventions regarding bladder cancer   Imaging Studies ordered:  I ordered imaging studies including x-ray of lumbar spine, CT lumbar spine I independently visualized and interpreted imaging which showed: X-ray of lumbar spine: Interval moderate to severe compression fracture at L5, age-indeterminate, chronic superior endplate deformities at L2's, L3, L4, trace retrolisthesis of L2 on L3, multilevel degenerative changes CT lumbar spine: Subacute compression fracture at L5, borderline central canal stenosis at L4-L5, etc. I agree with the radiologist interpretation   Medicines ordered and prescription drug management:  I ordered medication including oxycodone , tizanidine , lidocaine  patches for back pain Reevaluation of the patient after these medicines showed that the patient stayed the same I have reviewed the patients home medicines and have made adjustments as needed   Test Considered:  MRI lumbar spine: Deferred at this time as patient denies groin numbness/tingling, bowel/bladder incontinence, has no history of IV drug use, is ambulatory with cane, motor exam reassuring, very low clinical suspicion for cauda equina syndrome at this time   Critical Interventions:  None  Problem List / ED Course:  83 year old male, vital signs stable, presents to the emergency department with a chief complaint of acute right sided low back pain, denies known trauma/injury On physical exam patient well-appearing, physical exam reassuring including motor and sensation of bilateral lower extremities, patient ambulatory with cane, pain largely not reproducible with palpation X-ray imaging significant for age-indeterminate  compression fracture at L5 along with other findings, due to patient age, risk factors, as well as potential for metastatic disease with new cancer diagnosis will obtain CT as well CT significant for subacute L5 compression fracture as well as borderline central stenosis at L4-L5 Will order patient LSO brace as patient does not currently have 1, also attempted pain management in the emergency department with limited effect Patient has moderate discomfort with movement but does appear comfortable while sitting, difficult situation regarding pain control as patient has a history of chronic pain syndrome but weaned himself off of all narcotic medications.  Patient does have a history of chronic kidney disease and is elderly so would like to avoid NSAIDs if possible.  I have attempted narcotic pain medication as well as a muscle relaxant as well as lidocaine  patches. Patient educated on findings today Return precautions given Patient discharged, pending LSO brace arrival from [facility] Most likely diagnosis at this time is acute on chronic back pain, subacute L5 compression fracture, low clinical suspicion for acute life-threatening process, also sent in referral information for neurosurgery   Reevaluation:  After the interventions noted above, I reevaluated the patient and found that they have :improved   Social Determinants of Health:  none   Dispostion:  After consideration of the diagnostic results and the patients response to treatment, I feel that the patient would benefit from discharge and follow-up with neurosurgery     Final diagnoses:  Acute right-sided low back pain with right-sided sciatica  Compression fracture of L5 vertebra, initial encounter (HCC)  Central spinal stenosis    ED Discharge Orders          Ordered    oxyCODONE  (ROXICODONE ) 5 MG immediate release tablet  Every 6 hours PRN        05/05/24  1928    lidocaine  (LIDODERM ) 5 %  Every 24 hours        05/05/24 [address] [place] 05/05/24 1948    Randol Simmonds, MD 05/09/24 [phone]

## 2024-05-05 NOTE — ED Triage Notes (Signed)
 Pt c/o back pain, worse w ambulation onset 1wk ago. Advises he's unsure if he pulled muscle, but the pain comes up my R leg & around to back. Denies bowel/ bladder incontinence.

## 2024-05-05 NOTE — Discharge Instructions (Addendum)
 It was a pleasure taking care of you today.  Based on your history, physical exam, as well as your imaging I feel you are safe for discharge.  Today the imaging of your back did show a compression fracture at the level of L5 as well as some impingement of the nerves.  Because of this you have been given a back brace.  I have also sent in some medications to your pharmacy.  Please pick them up and take as prescribed.  You may continue over-the-counter Tylenol  to help with your back pain, please do not exceed max daily limits.  I have sent in oxycodone  which is a prescription narcotic medication, do not drive or operate heavy machinery after taking this medication as it may make you drowsy.  I have also sent in some lidocaine  patches as well.  If you experience any of the following symptoms including but not limited to groin numbness/tingling, issues with bowel/bladder incontinence, inability to walk, unexplained weakness, fever, chills, or other concerning symptom please return to the emergency department or seek further medical care.  Please follow-up with neurosurgery as soon as possible, I have attached the information. Please make your primary care provider aware of your work-up and findings today, I also recommend following up with them regarding refills if needed. If symptoms worsen recommend follow-up within 48 hours.

## 2024-05-05 NOTE — Progress Notes (Signed)
 Orthopedic Tech Progress Note Patient Details:  KARTER HELLMER 12-10-1940 [id number]  Patient ID: [name], male   DOB: [birth year], 83 y.o.   MRN: [id number] Order for lso called into [facility]. Chandra Dorn PARAS 05/05/2024, 7:38 PM

## 2024-05-13 ENCOUNTER — Ambulatory Visit: Admitting: Orthopedic Surgery

## 2024-05-13 DIAGNOSIS — I890 Lymphedema, not elsewhere classified: Secondary | ICD-10-CM

## 2024-05-13 DIAGNOSIS — L97322 Non-pressure chronic ulcer of left ankle with fat layer exposed: Secondary | ICD-10-CM | POA: Diagnosis not present

## 2024-05-14 ENCOUNTER — Encounter: Payer: Self-pay | Admitting: Orthopedic Surgery

## 2024-05-14 NOTE — Progress Notes (Signed)
 Office Visit Note   Patient: [name] Fernandez           Date of Birth: 10-May-1941           MRN: [id number] Visit Date: 05/13/2024              Requested by: No referring provider defined for this encounter. PCP: Alvia Corean CROME, FNP  Chief Complaint  Patient presents with   Left Ankle - Wound Check      HPI: Discussed the use of AI scribe software for clinical note transcription with the patient, who gave verbal consent to proceed.  History of Present Illness [name] Fernandez is an 83 year old male with a lumbar compression fracture and bladder cancer who presents with increased swelling and worsening of a foot wound.  He noticed changes in his foot wound after taking a shower last night. He left the bandage off for about three hours, and the wound appeared to be bleeding or looked different than before. He is currently using Edrick and Centil on the wound at different times. His leg is more swollen. He is unable to work due to his current condition.  He has a compression fracture at L5, which he is unsure how it occurred, as he did not experience any trauma. He has an upcoming appointment for further evaluation. He underwent a CT scan at the hospital. The fracture is causing significant pain, making it difficult for him to breathe and move, including getting into bed. He mentions that he has to lift his feet to get into bed and cannot turn over due to the pain.  He was recently diagnosed with bladder cancer in July.       Assessment & Plan: Visit Diagnoses:  1. Lymphedema   2. 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 Ischemic wound bed with good dorsalis pedis pulse. Swelling and ulcer exacerbated by lumbar compression fracture pain, possibly linked to bladder cancer. Risk of necrosis if ulcer remains  open. - Continue Betadine  and Ceralyte dressing changes. - Keep ulcer covered and elevate foot. - Plan hospital admission for wound cleaning, tissue cultures, IV antibiotics, and tissue grafting.  Pending the results and findings of the compression fracture. - Schedule follow-up in two weeks.      Follow-Up Instructions: No follow-ups on file.   Ortho Exam  Patient is alert, oriented, no adenopathy, well-dressed, normal affect, normal respiratory effort. Physical Exam EXTREMITIES: Dorsalis pedis pulse present. No arterial insufficiency. Ulcer on left lower extremity enlarged due to swelling. Ischemic wound bed measuring 11x5 cm.  Significant increase swelling of the left lower extremity secondary to the lumbar compression fracture.  Currently no correlation between the compression fracture and his bladder cancer.      Imaging: No results found. No images are attached to the encounter.  Labs: Lab Results  Component Value Date   HGBA1C 6.2 (H) 09/22/2023   HGBA1C 6.5 (H) 04/10/2023   HGBA1C 6.3 (H) 10/05/2022   ESRSEDRATE 34 (H) 01/15/2023   ESRSEDRATE 32 (H) 06/15/2022   ESRSEDRATE 40 (H) 06/14/2022   CRP 0.7 01/15/2023   CRP 0.8 06/15/2022   CRP <0.5 06/14/2022   LABURIC 7.8 05/20/2014   LABURIC 6.8 05/20/2013   REPTSTATUS 01/17/2023 FINAL 01/12/2023   GRAMSTAIN NO WBC SEEN NO ORGANISMS SEEN  01/12/2023  CULT  01/12/2023    No growth aerobically or anaerobically. Performed at [facility], [address], [place], [place] [address]    LABORGA PSEUDOMONAS AERUGINOSA 11/10/2022   LABORGA ENTEROCOCCUS FAECALIS 11/10/2022     Lab Results  Component Value Date   ALBUMIN 3.9 09/21/2023   ALBUMIN 2.9 (L) 06/16/2022   ALBUMIN 3.8 06/14/2022   PREALBUMIN 18 06/14/2022    Lab Results  Component Value Date   MG 1.8 04/10/2023   MG 1.8 06/19/2022   MG 1.9 04/04/2022   Lab Results  Component Value Date   VD25OH 45 04/10/2023   VD25OH 57 10/05/2022    VD25OH 59 04/04/2022    Lab Results  Component Value Date   PREALBUMIN 18 06/14/2022      Latest Ref Rng & Units 12/06/2023    2:24 PM 09/21/2023    1:24 PM 04/10/2023    3:22 PM  CBC EXTENDED  WBC 4.0 - 10.5 K/uL 7.6  8.3  8.3   RBC 4.22 - 5.81 MIL/uL 3.54  3.84  3.65   Hemoglobin 13.0 - 17.0 g/dL 89.3  88.8  88.6   HCT 39.0 - 52.0 % 33.3  33.4  34.3   Platelets 150 - 400 K/uL 541  451  474   NEUT# 1,500 - 7,800 cells/uL   4,382      There is no height or weight on file to calculate BMI.  Orders:  No orders of the defined types were placed in this encounter.  No orders of the defined types were placed in this encounter.    Procedures: No procedures performed  Clinical Data: No additional findings.  ROS:  All other systems negative, except as noted in the HPI. Review of Systems  Objective: Vital Signs: There were no vitals taken for this visit.  Specialty Comments:  No specialty comments available.  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/16/2022   Osteomyelitis (HCC) 06/16/2022   Tenosynovitis of tibialis posterior tendon 06/15/2022   Wound infection 06/15/2022   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   Anemia 03/13/2019   Chronic venous insufficiency 03/12/2019   CKD (chronic kidney disease) stage 2, 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/2015   COPD (chronic obstructive pulmonary disease) (HCC) 12/08/2015   Chronic pain syndrome 05/26/2015   DDD (degenerative disc disease), lumbar 05/26/2015   Medication management 05/26/2015   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   Lumbago with sciatica 08/06/2014   Hyperlipidemia 08/20/2013   Essential hypertension    Abnormal glucose    Vitamin D  deficiency    Arthritis    Vitamin B12 deficiency    Celiac disease 06/19/2012   GERD (gastroesophageal reflux disease) 04/30/2012   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 tube) procedure 04/30/2012   Hypothyroidism 04/30/2012   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   Past Medical History:  Diagnosis Date   Anemia    Celiac disease    diagnoses 06/24/12   Chronic ulcer of ankle (HCC)    left   COPD (chronic obstructive pulmonary disease) (HCC)    Esophageal stricture  GERD (gastroesophageal reflux disease)    Hiatal hernia    Hypertension    patient states  no current HTN   Hypothyroidism    Pre-diabetes    Prediabetes    Shortness of breath    from oxycodone  at times   Vitamin B12 deficiency    Vitamin D  deficiency     Family History  Problem Relation Age of Onset   Esophageal cancer Father    Cancer Father    Early death Mother     Past Surgical History:  Procedure Laterality Date   APPENDECTOMY     APPLICATION OF WOUND VAC Left 01/12/2023   Procedure: APPLICATION OF WOUND VAC;  Surgeon: Harden Jerona GAILS, MD;  Location: [facility] OR;  Service: Orthopedics;  Laterality: Left;   BACK SURGERY     3 diff surgeries for vertebrea broken   CYSTOSCOPY W/ RETROGRADES Bilateral 12/11/2023   Procedure: CYSTOSCOPY, WITH RETROGRADE PYELOGRAM;  Surgeon: Shane Steffan BROCKS, MD;  Location: [facility] ORS;  Service: Urology;  Laterality: Bilateral;   EYE SURGERY     bilateral cataract surgery   HERNIA REPAIR  02/10/1949   bilateral groin as child   HIATAL HERNIA REPAIR     I & D EXTREMITY Left 06/16/2022   Procedure: IRRIGATION AND DEBRIDEMENT ANKLE;  Surgeon: Harden Jerona GAILS, MD;  Location: [facility] OR;  Service: Orthopedics;  Laterality: Left;   I & D EXTREMITY Left 07/21/2022   Procedure: LEFT ANKLE DEBRIDEMENT;  Surgeon: Harden Jerona GAILS, MD;  Location: [facility] OR;   Service: Orthopedics;  Laterality: Left;   I & D EXTREMITY Left 11/10/2022   Procedure: LEFT ANKLE DEBRIDEMENT;  Surgeon: Harden Jerona GAILS, MD;  Location: [facility] OR;  Service: Orthopedics;  Laterality: Left;   I & D EXTREMITY Left 01/12/2023   Procedure: LEFT ANKLE DEBRIDEMENT;  Surgeon: Harden Jerona GAILS, MD;  Location: [facility] OR;  Service: Orthopedics;  Laterality: Left;   JOINT REPLACEMENT  04/12/2010   right knee replacement   TONSILLECTOMY     as child   TOTAL KNEE REVISION  11/20/2011   Procedure: TOTAL KNEE REVISION;  Surgeon: Donnice JONETTA Car, MD;  Location: [facility] OR[facility];  Service: Orthopedics;  Laterality: Right;  Right Total Knee Revision   Social History   Occupational History   Occupation: Retired    Associate Professor: [employer]  Tobacco Use   Smoking status: Former    Current packs/day: 0.00    Average packs/day: 1.2 packs/day for 31.7 years (37.6 ttl pk-yrs)    Types: Cigarettes    Start date: 06/27/1960    Quit date: 06/27/1980    Years since quitting: 43.9    Passive exposure: Past   Smokeless tobacco: Never  Vaping Use   Vaping status: Never Used  Substance and Sexual Activity   Alcohol use: No   Drug use: No   Sexual activity: Not Currently

## 2024-05-19 ENCOUNTER — Telehealth: Payer: Self-pay | Admitting: Orthopedic Surgery

## 2024-05-19 NOTE — Telephone Encounter (Signed)
 Order has been placed. They will contact him once ready to ship out.

## 2024-05-19 NOTE — Telephone Encounter (Signed)
 Pt called wanting to know if you have called in the supplies for Prisem. Call back number is [phone]

## 2024-05-20 DIAGNOSIS — L97929 Non-pressure chronic ulcer of unspecified part of left lower leg with unspecified severity: Secondary | ICD-10-CM | POA: Diagnosis not present

## 2024-05-21 ENCOUNTER — Encounter: Payer: Self-pay | Admitting: Neuroradiology

## 2024-05-21 ENCOUNTER — Ambulatory Visit: Admitting: Neuroradiology

## 2024-05-21 VITALS — BP 117/69 | HR 94 | Temp 97.6°F | Ht 65.0 in | Wt 166.0 lb

## 2024-05-21 DIAGNOSIS — S32000A Wedge compression fracture of unspecified lumbar vertebra, initial encounter for closed fracture: Secondary | ICD-10-CM

## 2024-05-21 DIAGNOSIS — X500XXA Overexertion from strenuous movement or load, initial encounter: Secondary | ICD-10-CM

## 2024-05-21 DIAGNOSIS — M810 Age-related osteoporosis without current pathological fracture: Secondary | ICD-10-CM

## 2024-05-21 DIAGNOSIS — S32050A Wedge compression fracture of fifth lumbar vertebra, initial encounter for closed fracture: Secondary | ICD-10-CM

## 2024-05-21 NOTE — Progress Notes (Unsigned)
 Chief Complaint: Patient was seen in consultation today for  Chief Complaint  Patient presents with   New Patient (Initial Visit)    Compression Fracture L5    at the request of Matthews,Stephanie L  Referring Physician(s): Matthews,Stephanie L  [name] Fernandez is a 83 y.o. male referred for evaluation of a compression fracture  Assessment and Plan Assessment & Plan Acute L5 vertebral compression fracture New L5 compression fracture with 60% vertebral height loss, likely from lifting injury 2 weeks ago.  Severe pain which he describes as 9/10. 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 Because of the osteoporosis is not entirely clear.  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  This has been a 2-year problem.  The lesion is currently greenish colored.  His orthopedic surgeon wants to admit him for surgical treatment and intravenous antibiotics. - Consult with Dr. Harden on infection risk and timing for vertebroplasty/kyphoplasty.  I discussed the treatment option of vertebroplasty/kyphoplasty.  I have explained that the procedure may provide relief of the pain, but the pain relief may be incomplete or not at all as the results are variable.  My biggest concern in terms of complications is the risk of infection if he becomes bacteremic from his chronically infected left ankle venous ulcer.   Discussed the use of AI scribe software for clinical note transcription with the patient, who gave verbal consent to proceed.  History of Present Illness [name] Fernandez is an 83 year old male with osteoporosis who presents with severe back pain due to a recent L5 compression fracture.  Approximately two weeks ago, he experienced severe back pain after lifting a heavy object. The pain is located in his right hip and waist, radiating across his back.  He rates the pain as 10/10 when moving and 8-9/10 when sitting. The pain has not improved since onset. A lumbar spine CT on May 05, 2024, revealed a new compression fracture at L5 with about 60% loss of vertebral height. Previous imaging showed old compression fractures at L2, L3, and L4, which are unchanged. He has a history of kyphoplasty for fractures at T10, T11, and T12 approximately ten years ago, which provided some relief.  He has a venous stasis ulcer on the outside of his left ankle, present for over two years. The ulcer has been treated with IV antibiotics and hospitalizations, but it has not healed completely. Recently, the ulcer has turned green. He is not currently on antibiotics.  No fever, lung problems, or heart issues. He has a history of bladder cancer but no history of high blood pressure, diabetes, or heart attacks. He quit smoking in the 1980s and does not consume alcohol.    Past Medical History:  Diagnosis Date   Anemia    Celiac disease    diagnoses 06/24/12   Chronic ulcer of ankle (HCC)    left   COPD (chronic obstructive pulmonary disease) (HCC)    Esophageal stricture    GERD (gastroesophageal reflux disease)    Hiatal hernia    Hypertension    patient states  no current HTN   Hypothyroidism    Pre-diabetes    Prediabetes    Shortness of breath    from oxycodone  at times   Vitamin B12 deficiency    Vitamin D  deficiency     Past Surgical History:  Procedure Laterality Date   APPENDECTOMY  APPLICATION OF WOUND VAC Left 01/12/2023   Procedure: APPLICATION OF WOUND VAC;  Surgeon: Harden Jerona GAILS, MD;  Location: [facility] OR;  Service: Orthopedics;  Laterality: Left;   BACK SURGERY     3 diff surgeries for vertebrea broken   CYSTOSCOPY W/ RETROGRADES Bilateral 12/11/2023   Procedure: CYSTOSCOPY, WITH RETROGRADE PYELOGRAM;  Surgeon: Shane Steffan BROCKS, MD;  Location: [facility] ORS;  Service: Urology;  Laterality: Bilateral;   EYE SURGERY     bilateral cataract  surgery   HERNIA REPAIR  02/10/1949   bilateral groin as child   HIATAL HERNIA REPAIR     I & D EXTREMITY Left 06/16/2022   Procedure: IRRIGATION AND DEBRIDEMENT ANKLE;  Surgeon: Harden Jerona GAILS, MD;  Location: [facility] OR;  Service: Orthopedics;  Laterality: Left;   I & D EXTREMITY Left 07/21/2022   Procedure: LEFT ANKLE DEBRIDEMENT;  Surgeon: Harden Jerona GAILS, MD;  Location: [facility] OR;  Service: Orthopedics;  Laterality: Left;   I & D EXTREMITY Left 11/10/2022   Procedure: LEFT ANKLE DEBRIDEMENT;  Surgeon: Harden Jerona GAILS, MD;  Location: [facility] OR;  Service: Orthopedics;  Laterality: Left;   I & D EXTREMITY Left 01/12/2023   Procedure: LEFT ANKLE DEBRIDEMENT;  Surgeon: Harden Jerona GAILS, MD;  Location: [facility] OR;  Service: Orthopedics;  Laterality: Left;   JOINT REPLACEMENT  04/12/2010   right knee replacement   TONSILLECTOMY     as child   TOTAL KNEE REVISION  11/20/2011   Procedure: TOTAL KNEE REVISION;  Surgeon: Donnice JONETTA Car, MD;  Location: [facility] ORS;  Service: Orthopedics;  Laterality: Right;  Right Total Knee Revision    Allergies: Feraheme  [ferumoxytol ], Latex, Sulfa  antibiotics, Wound dressing adhesive, Levothyroxine , and Doxycycline   Medications: Prior to Admission medications   Medication Sig Start Date End Date Taking? Authorizing Provider  Ascorbic Acid (VITAMIN C) 1000 MG tablet Take 1,000 mg by mouth in the morning.   Yes [provider]  betamethasone  dipropionate 0.05 % cream Apply 1 Application topically 2 (two) times daily as needed (skin rash/irritation.). 07/26/23  Yes [provider]  ciprofloxacin  (CIPRO ) 500 MG tablet Take 1 tablet (500 mg total) by mouth 2 (two) times daily. 03/26/24  Yes Valdemar Longs R, NP  diphenhydrAMINE  (BENADRYL ) 25 MG tablet Take 50 mg by mouth at bedtime.   Yes [provider]  doxepin  (SINEQUAN ) 10 MG capsule Take 10-20 mg by mouth at bedtime as needed (itching). 07/28/23  Yes [provider]  hydrOXYzine  (ATARAX ) 25 MG tablet TAKE 1  TABLET 3 x / day  AS NEEDED FOR ANXIETY OR SLEEP                                                             /                                                                   TAKE  BY                                                 MOUTH 03/14/23  Yes Tonita Fallow, MD  lidocaine  (LIDODERM ) 5 % Place 1 patch onto the skin daily. Remove & Discard patch within 12 hours or as directed by MD 05/05/24  Yes Hinnant, Collin F, PA-C  MAGNESIUM  PO Take 2 capsules by mouth at bedtime.   Yes [provider]  MELATONIN PO Take 10 mg by mouth at bedtime.   Yes [provider]  omeprazole  (PRILOSEC  OTC) 20 MG tablet Take 20 mg by mouth daily before breakfast.   Yes [provider]  oxyCODONE  (ROXICODONE ) 5 MG immediate release tablet Take 1 tablet (5 mg total) by mouth every 6 (six) hours as needed for breakthrough pain. 05/05/24  Yes Hinnant, Collin F, PA-C  torsemide  (DEMADEX ) 20 MG tablet Take 2 tablets (40 mg total) by mouth daily as needed (For worsening swelling or more than 2 pound weight gain from your baseline.). Take 2 tablet  Daily for Fluid Retention / Leg Swelling 09/23/23  Yes Ghimire, Donalda HERO, MD  triamcinolone  cream (KENALOG ) 0.1 % APPLY TO THE LEFT LEG UNDER THE WRAP DAILY WHERE THERE IS ITCHING OR RASH X 2 WEEKS OR UNTIL HEALED Patient taking differently: Apply 1 Application topically daily. 07/26/23  Yes Harden Jerona GAILS, MD  Wound Cleansers (VASHE WOUND EX) Apply 1 Application topically daily as needed (wound cleansing).   Yes [provider]     Family History  Problem Relation Age of Onset   Esophageal cancer Father    Cancer Father    Early death Mother     Social History   Socioeconomic History   Marital status: Married    Spouse name: [name]   Number of children: 2   Years of education: Not on file   Highest education level: GED or equivalent  Occupational History   Occupation: Retired    Associate Professor:  [employer]  Tobacco Use   Smoking status: Former    Current packs/day: 0.00    Average packs/day: 1.2 packs/day for 31.7 years (37.6 ttl pk-yrs)    Types: Cigarettes    Start date: 06/27/1960    Quit date: 06/27/1980    Years since quitting: 43.9    Passive exposure: Past   Smokeless tobacco: Never  Vaping Use   Vaping status: Never Used  Substance and Sexual Activity   Alcohol use: No   Drug use: No   Sexual activity: Not Currently  Other Topics Concern   Not on file  Social History Narrative   Not on file   Social Drivers of Health   Financial Resource Strain: Low Risk  (03/07/2024)   Overall Financial Resource Strain (CARDIA)    Difficulty of Paying Living Expenses: Not hard at all  Food Insecurity: No Food Insecurity (03/07/2024)   Hunger Vital Sign    Worried About Running Out of Food in the Last Year: Never true    Ran Out of Food in the Last Year: Never true  Transportation Needs: No Transportation Needs (03/07/2024)   PRAPARE - Administrator, Civil Service (Medical): No    Lack of Transportation (Non-Medical): No  Physical Activity: Inactive (03/07/2024)   Exercise Vital Sign    Days of Exercise per Week:  0 days    Minutes of Exercise per Session: Not on file  Stress: Stress Concern Present (03/07/2024)   [employer] of Occupational Health - Occupational Stress Questionnaire    Feeling of Stress: To some extent  Social Connections: Moderately Isolated (03/07/2024)   Social Connection and Isolation Panel    Frequency of Communication with Friends and Family: Three times a week    Frequency of Social Gatherings with Friends and Family: Once a week    Attends Religious Services: Patient declined    Database Administrator or Organizations: No    Attends Engineer, Structural: Not on file    Marital Status: Married    Review of Systems  Vital Signs:  BP 117/69   Pulse 94   Temp 97.6 F (36.4 C) (Oral)   Ht 5' 5 (1.651 m)    Wt 166 lb (75.3 kg)   BMI 27.62 kg/m   Physical Exam CHEST: Lungs clear to auscultation bilaterally, no wheezes, rhonchi, or crackles. CARDIOVASCULAR: Heart sounds normal with regular rate and rhythm, S1 and S2 normal without murmurs. EXTREMITIES: Significant swelling in the left leg, venous stasis ulcer on left ankle with green discoloration.  Imaging:  Results RADIOLOGY Lumbar spine CT: New compression fracture at L5 with 60% loss of vertebral height; old compression fractures at L2, L3, and L4 unchanged (05/05/2024)  Labs:  CBC: Recent Labs    09/21/23 1324 12/06/23 1424  WBC 8.3 7.6  HGB 11.1* 10.6*  HCT 33.4* 33.3*  PLT 451* 541*    COAGS: No results for input(s): INR, APTT in the last 8760 hours.  BMP: Recent Labs    09/21/23 1324 12/06/23 1424  NA 138 137  K 3.8 4.7  CL 102 103  CO2 28 25  GLUCOSE 108* 114*  BUN 23 22  CALCIUM  9.0 8.9  CREATININE 1.10 0.90  GFRNONAA >60 >60    LIVER FUNCTION TESTS: Recent Labs    09/21/23 1324  BILITOT 0.7  AST 28  ALT 22  ALKPHOS 85  PROT 7.5  ALBUMIN 3.9      Electronically Signed: Nancyann LULLA Burns  05/21/2024, 4:57 PM

## 2024-05-22 ENCOUNTER — Encounter: Payer: Self-pay | Admitting: Physician Assistant

## 2024-05-22 ENCOUNTER — Other Ambulatory Visit: Payer: Self-pay

## 2024-05-22 ENCOUNTER — Encounter (HOSPITAL_COMMUNITY): Payer: Self-pay | Admitting: Orthopedic Surgery

## 2024-05-22 ENCOUNTER — Ambulatory Visit: Admitting: Physician Assistant

## 2024-05-22 DIAGNOSIS — L97322 Non-pressure chronic ulcer of left ankle with fat layer exposed: Secondary | ICD-10-CM | POA: Diagnosis not present

## 2024-05-22 NOTE — H&P (Signed)
 [name] Fernandez is an 83 y.o. male.   Chief Complaint: Chronic infected ankle wound on the left HPI:  [name] Fernandez is an 83 year old male with a lumbar compression fracture and bladder cancer who presents with increased swelling and worsening of a foot wound.  He has also had a L5 compression fracture and on his last visit he was in so much pain from his back we asked that he follow up with a spine physician for treatment and then we would return to the OR for irrigation and debridement of the left ankle.               The ankle wound is getting larger and more painful.  He say Dr. Lester interventional Radiologist for his L5 compression fracture and he recommended we treat the ankle wound first due to possible infection to prevent possible spine contamination.  He is here today for follow up exam.    Past Medical History:  Diagnosis Date   Anemia    Celiac disease    diagnoses 06/24/12   Chronic ulcer of ankle (HCC)    left   COPD (chronic obstructive pulmonary disease) (HCC)    Esophageal stricture    GERD (gastroesophageal reflux disease)    Hiatal hernia    Hypertension    patient states  no current HTN   Hypothyroidism    Pre-diabetes    Prediabetes    Shortness of breath    from oxycodone  at times   Vitamin B12 deficiency    Vitamin D  deficiency     Past Surgical History:  Procedure Laterality Date   APPENDECTOMY     APPLICATION OF WOUND VAC Left 01/12/2023   Procedure: APPLICATION OF WOUND VAC;  Surgeon: Harden Jerona GAILS, MD;  Location: [facility] OR;  Service: Orthopedics;  Laterality: Left;   BACK SURGERY     3 diff surgeries for vertebrea broken   CYSTOSCOPY W/ RETROGRADES Bilateral 12/11/2023   Procedure: CYSTOSCOPY, WITH RETROGRADE PYELOGRAM;  Surgeon: Shane Steffan JAYSON, MD;  Location: [facility] ORS;  Service: Urology;  Laterality: Bilateral;   EYE SURGERY     bilateral cataract surgery   HERNIA REPAIR  02/10/1949   bilateral groin as child   HIATAL HERNIA REPAIR     I & D  EXTREMITY Left 06/16/2022   Procedure: IRRIGATION AND DEBRIDEMENT ANKLE;  Surgeon: Harden Jerona GAILS, MD;  Location: [facility] OR;  Service: Orthopedics;  Laterality: Left;   I & D EXTREMITY Left 07/21/2022   Procedure: LEFT ANKLE DEBRIDEMENT;  Surgeon: Harden Jerona GAILS, MD;  Location: [facility] OR;  Service: Orthopedics;  Laterality: Left;   I & D EXTREMITY Left 11/10/2022   Procedure: LEFT ANKLE DEBRIDEMENT;  Surgeon: Harden Jerona GAILS, MD;  Location: [facility] OR;  Service: Orthopedics;  Laterality: Left;   I & D EXTREMITY Left 01/12/2023   Procedure: LEFT ANKLE DEBRIDEMENT;  Surgeon: Harden Jerona GAILS, MD;  Location: [facility] OR;  Service: Orthopedics;  Laterality: Left;   JOINT REPLACEMENT  04/12/2010   right knee replacement   TONSILLECTOMY     as child   TOTAL KNEE REVISION  11/20/2011   Procedure: TOTAL KNEE REVISION;  Surgeon: Donnice JONETTA Car, MD;  Location: [facility] ORS;  Service: Orthopedics;  Laterality: Right;  Right Total Knee Revision    Family History  Problem Relation Age of Onset   Esophageal cancer Father    Cancer Father    Early death Mother    Social History:  reports that  he quit smoking about 43 years ago. His smoking use included cigarettes. He started smoking about 63 years ago. He has a 37.6 pack-year smoking history. He has been exposed to tobacco smoke. He has never used smokeless tobacco. He reports that he does not drink alcohol and does not use drugs.  Allergies: Allergies[1]  No medications prior to admission.    No results found for this or any previous visit (from the past 48 hours). No results found.  Review of Systems  All other systems reviewed and are negative.   There were no vitals taken for this visit. Physical Exam   Patient is alert, oriented, no adenopathy, well-dressed, normal affect, normal respiratory effort. Chronic non healing left ankle wound with gangrenous tissue changes.  He does have an easily palpable pulse.  Mild cellulitis surround the skin edges of the wound.  The wound  measures 12 cm x 6 cm.     Assessment/Plan 1. 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   Pending OR outcome he may return to the OR the following week for further debridement.  Maurilio Deland Collet, PA-C 05/22/2024, 4:18 PM        [1]  Allergies Allergen Reactions   Feraheme  [Ferumoxytol ] Swelling and Other (See Comments)    Swelling of lips and tongue, could not talk 30 minutes after the infusion.   Latex Other (See Comments)    Latex butterfly bandages tore off the skin and caused terrible blisters   Sulfa  Antibiotics Hives    Severe itching, blisters   Wound Dressing Adhesive Other (See Comments)    Latex butterfly bandages tore off the skin and caused terrible blisters   Levothyroxine  Swelling and Other (See Comments)    Lip swelling   Doxycycline  Nausea Only

## 2024-05-22 NOTE — Progress Notes (Signed)
 PCP - Alvia Corean CROME, FNP  Cardiologist -   PPM/ICD - denies Device Orders - n/a Rep Notified - n/a  Chest x-ray - 09-21-2023 EKG - 09-21-23 Stress Test -  ECHO - 09-23-2023 Cardiac Cath -   CPAP - denies  Dm-denies  Blood Thinner Instructions: denies Aspirin  Instructions: denies  ERAS Protcol - clear liquids until 10:45  COVID TEST- n/a  Anesthesia review: no  Patient verbally denies any shortness of breath, fever, cough and chest pain during phone call   -------------  SDW INSTRUCTIONS given:  Your procedure is scheduled on May 23, 2024.  Report to [facility] Main Entrance A at 11:15 A.M., and check in at the Admitting office.  Call this number if you have problems the morning of surgery:  [phone]   Remember:  Do not eat after midnight the night before your surgery  You may drink clear liquids until 10:45 the morning of your surgery.   Clear liquids allowed are: Water, Non-Citrus Juices (without pulp), Carbonated Beverages, Clear Tea, Black Coffee Only, and Gatorade    Take these medicines the morning of surgery with A SIP OF WATER  hydrOXYzine  (ATARAX )  omeprazole  (PRILOSEC  OTC)  Oxycodone     As of today, STOP taking any Aspirin  (unless otherwise instructed by your surgeon) Aleve, Naproxen, Ibuprofen, Motrin, Advil, Goody's, BC's, all herbal medications, fish oil, and all vitamins.                      Do not wear jewelry, make up, or nail polish            Do not wear lotions, powders, perfumes/colognes, or deodorant.            Do not shave 48 hours prior to surgery.  Men may shave face and neck.            Do not bring valuables to the hospital.            [facility] is not responsible for any belongings or valuables.  Do NOT Smoke (Tobacco/Vaping) 24 hours prior to your procedure If you use a CPAP at night, you may bring all equipment for your overnight stay.   Contacts, glasses, dentures or bridgework may not be worn into surgery.       For patients admitted to the hospital, discharge time will be determined by your treatment team.   Patients discharged the day of surgery will not be allowed to drive home, and someone needs to stay with them for 24 hours.    Special instructions:   [facility]- Preparing For Surgery  Before surgery, you can play an important role. Because skin is not sterile, your skin needs to be as free of germs as possible. You can reduce the number of germs on your skin by washing with CHG (chlorahexidine gluconate) Soap before surgery.  CHG is an antiseptic cleaner which kills germs and bonds with the skin to continue killing germs even after washing.    Oral Hygiene is also important to reduce your risk of infection.  Remember - BRUSH YOUR TEETH THE MORNING OF SURGERY WITH YOUR REGULAR TOOTHPASTE  Please do not use if you have an allergy to CHG or antibacterial soaps. If your skin becomes reddened/irritated stop using the CHG.  Do not shave (including legs and underarms) for at least 48 hours prior to first CHG shower. It is OK to shave your face.  Please follow these instructions carefully.   Shower  the NIGHT BEFORE SURGERY and the MORNING OF SURGERY with DIAL Soap.   Pat yourself dry with a CLEAN TOWEL.  Wear CLEAN PAJAMAS to bed the night before surgery  Place CLEAN SHEETS on your bed the night of your first shower and DO NOT SLEEP WITH PETS.   Day of Surgery: Please shower morning of surgery  Wear Clean/Comfortable clothing the morning of surgery Do not apply any deodorants/lotions.   Remember to brush your teeth WITH YOUR REGULAR TOOTHPASTE.   Questions were answered. Patient verbalized understanding of instructions.

## 2024-05-22 NOTE — Progress Notes (Signed)
 Office Visit Note   Patient: [name] Fernandez           Date of Birth: Aug 07, 1940           MRN: [id number] Visit Date: 05/22/2024              Requested by: Alvia Corean CROME, FNP [address] [place],  [place] [address] PCP: Alvia Corean CROME, FNP  Chief Complaint  Patient presents with   Left Ankle - Wound Check      HPI: [name] Fernandez is an 83 year old male with a lumbar compression fracture and bladder cancer who presents with increased swelling and worsening of a foot wound.  He has also had a L5 compression fracture and on his last visit he was in so much pain from his back we asked that he follow up with a spine physician for treatment and then we would return to the OR for irrigation and debridement of the left ankle.    The ankle wound is getting larger and more painful.  He say Dr. Lester interventional Radiologist for his L5 compression fracture and he recommended we treat the ankle wound first due to possible infection to prevent possible spine contamination.  He is here today for follow up exam.    Assessment & Plan: Visit Diagnoses:  1. 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   Follow-Up Instructions: Return if symptoms worsen or fail to improve.   Ortho Exam  Patient is alert, oriented, no adenopathy, well-dressed, normal affect, normal respiratory effort. Chronic non healing left ankle wound with gangrenous tissue changes.  He does have an easily palpable pulse.  Mild cellulitis surround the skin edges of the wound.  The wound measures 12 cm x 6 cm.      Imaging: No results found. No images are attached to the encounter.  Labs: Lab Results  Component Value Date   HGBA1C 6.2 (H) 09/22/2023   HGBA1C 6.5 (H) 04/10/2023   HGBA1C 6.3 (H) 10/05/2022   ESRSEDRATE 34 (H) 01/15/2023   ESRSEDRATE  32 (H) 06/15/2022   ESRSEDRATE 40 (H) 06/14/2022   CRP 0.7 01/15/2023   CRP 0.8 06/15/2022   CRP <0.5 06/14/2022   LABURIC 7.8 05/20/2014   LABURIC 6.8 05/20/2013   REPTSTATUS 01/17/2023 FINAL 01/12/2023   GRAMSTAIN NO WBC SEEN NO ORGANISMS SEEN  01/12/2023   CULT  01/12/2023    No growth aerobically or anaerobically. Performed at [facility], [address] [place], [place] [address]    LABORGA PSEUDOMONAS AERUGINOSA 11/10/2022   LABORGA ENTEROCOCCUS FAECALIS 11/10/2022     Lab Results  Component Value Date   ALBUMIN 3.9 09/21/2023   ALBUMIN 2.9 (L) 06/16/2022   ALBUMIN 3.8 06/14/2022   PREALBUMIN 18 06/14/2022    Lab Results  Component Value Date   MG 1.8 04/10/2023   MG 1.8 06/19/2022   MG 1.9 04/04/2022   Lab Results  Component Value Date   VD25OH 45 04/10/2023   VD25OH 57 10/05/2022   VD25OH 59 04/04/2022    Lab Results  Component Value Date   PREALBUMIN 18 06/14/2022      Latest Ref Rng & Units 12/06/2023    2:24 PM 09/21/2023    1:24 PM 04/10/2023    3:22 PM  CBC EXTENDED  WBC 4.0 -  10.5 K/uL 7.6  8.3  8.3   RBC 4.22 - 5.81 MIL/uL 3.54  3.84  3.65   Hemoglobin 13.0 - 17.0 g/dL 89.3  88.8  88.6   HCT 39.0 - 52.0 % 33.3  33.4  34.3   Platelets 150 - 400 K/uL 541  451  474   NEUT# 1,500 - 7,800 cells/uL   4,382      There is no height or weight on file to calculate BMI.  Orders:  No orders of the defined types were placed in this encounter.  No orders of the defined types were placed in this encounter.    Procedures: No procedures performed  Clinical Data: No additional findings.  ROS:  All other systems negative, except as noted in the HPI. Review of Systems  Objective: Vital Signs: There were no vitals taken for this visit.  Specialty Comments:  No specialty comments available.  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) 06/16/2022   Osteomyelitis (HCC) 06/16/2022   Tenosynovitis of tibialis posterior tendon 06/15/2022   Wound infection 06/15/2022   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   Anemia 03/13/2019   Chronic venous insufficiency 03/12/2019   CKD (chronic kidney disease) stage 2, 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/2015   COPD (chronic obstructive pulmonary disease) (HCC) 12/08/2015   Chronic pain syndrome 05/26/2015   DDD (degenerative disc disease), lumbar 05/26/2015   Medication management 05/26/2015   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   Lumbago with sciatica 08/06/2014   Hyperlipidemia 08/20/2013   Essential hypertension    Abnormal glucose    Vitamin D  deficiency    Arthritis    Vitamin B12 deficiency    Celiac disease 06/19/2012   GERD (gastroesophageal reflux disease) 04/30/2012   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 tube) procedure 04/30/2012   Hypothyroidism 04/30/2012   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   Past Medical History:  Diagnosis Date   Anemia    Celiac disease    diagnoses 06/24/12   Chronic ulcer of ankle (HCC)    left   COPD (chronic obstructive pulmonary disease) (HCC)    Esophageal stricture    GERD (gastroesophageal reflux disease)    Hiatal hernia    Hypertension    patient states  no current HTN   Hypothyroidism    Pre-diabetes    Prediabetes    Shortness of breath    from oxycodone  at times   Vitamin B12 deficiency    Vitamin D  deficiency     Family History  Problem Relation Age of Onset   Esophageal cancer Father    Cancer Father    Early death Mother     Past Surgical History:  Procedure Laterality Date   APPENDECTOMY      APPLICATION OF WOUND VAC Left 01/12/2023   Procedure: APPLICATION OF WOUND VAC;  Surgeon: Harden Jerona GAILS, MD;  Location: [facility] OR;  Service: Orthopedics;  Laterality: Left;   BACK SURGERY     3 diff surgeries for vertebrea broken   CYSTOSCOPY W/ RETROGRADES Bilateral 12/11/2023   Procedure: CYSTOSCOPY, WITH RETROGRADE PYELOGRAM;  Surgeon: Shane Sensing  C, MD;  Location: [facility] ORS;  Service: Urology;  Laterality: Bilateral;   EYE SURGERY     bilateral cataract surgery   HERNIA REPAIR  02/10/1949   bilateral groin as child   HIATAL HERNIA REPAIR     I & D EXTREMITY Left 06/16/2022   Procedure: IRRIGATION AND DEBRIDEMENT ANKLE;  Surgeon: Harden Jerona GAILS, MD;  Location: [facility] OR;  Service: Orthopedics;  Laterality: Left;   I & D EXTREMITY Left 07/21/2022   Procedure: LEFT ANKLE DEBRIDEMENT;  Surgeon: Harden Jerona GAILS, MD;  Location: [facility] OR;  Service: Orthopedics;  Laterality: Left;   I & D EXTREMITY Left 11/10/2022   Procedure: LEFT ANKLE DEBRIDEMENT;  Surgeon: Harden Jerona GAILS, MD;  Location: [facility] OR;  Service: Orthopedics;  Laterality: Left;   I & D EXTREMITY Left 01/12/2023   Procedure: LEFT ANKLE DEBRIDEMENT;  Surgeon: Harden Jerona GAILS, MD;  Location: [facility] OR;  Service: Orthopedics;  Laterality: Left;   JOINT REPLACEMENT  04/12/2010   right knee replacement   TONSILLECTOMY     as child   TOTAL KNEE REVISION  11/20/2011   Procedure: TOTAL KNEE REVISION;  Surgeon: Donnice JONETTA Car, MD;  Location: [facility] ORS;  Service: Orthopedics;  Laterality: Right;  Right Total Knee Revision   Social History   Occupational History   Occupation: Retired    Associate Professor: [employer]  Tobacco Use   Smoking status: Former    Current packs/day: 0.00    Average packs/day: 1.2 packs/day for 31.7 years (37.6 ttl pk-yrs)    Types: Cigarettes    Start date: 06/27/1960    Quit date: 06/27/1980    Years since quitting: 43.9    Passive exposure: Past   Smokeless tobacco: Never  Vaping Use   Vaping status: Never Used  Substance  and Sexual Activity   Alcohol use: No   Drug use: No   Sexual activity: Not Currently

## 2024-05-23 ENCOUNTER — Inpatient Hospital Stay (HOSPITAL_COMMUNITY)
Admission: RE | Admit: 2024-05-23 | Discharge: 2024-05-30 | DRG: 574 | Disposition: A | Discharge status: Home / Self Care | Attending: Orthopedic Surgery | Admitting: Orthopedic Surgery

## 2024-05-23 ENCOUNTER — Encounter (HOSPITAL_COMMUNITY): Admission: RE | Disposition: A | Payer: Self-pay | Source: Home / Self Care | Attending: Orthopedic Surgery

## 2024-05-23 ENCOUNTER — Ambulatory Visit (HOSPITAL_COMMUNITY): Admitting: Anesthesiology

## 2024-05-23 ENCOUNTER — Encounter (HOSPITAL_COMMUNITY): Payer: Self-pay | Admitting: Orthopedic Surgery

## 2024-05-23 DIAGNOSIS — I10 Essential (primary) hypertension: Secondary | ICD-10-CM | POA: Diagnosis present

## 2024-05-23 DIAGNOSIS — Z882 Allergy status to sulfonamides status: Secondary | ICD-10-CM

## 2024-05-23 DIAGNOSIS — Z96651 Presence of right artificial knee joint: Secondary | ICD-10-CM | POA: Diagnosis present

## 2024-05-23 DIAGNOSIS — C679 Malignant neoplasm of bladder, unspecified: Secondary | ICD-10-CM | POA: Diagnosis present

## 2024-05-23 DIAGNOSIS — Z1624 Resistance to multiple antibiotics: Secondary | ICD-10-CM | POA: Diagnosis present

## 2024-05-23 DIAGNOSIS — Z91040 Latex allergy status: Secondary | ICD-10-CM | POA: Diagnosis not present

## 2024-05-23 DIAGNOSIS — L97322 Non-pressure chronic ulcer of left ankle with fat layer exposed: Principal | ICD-10-CM | POA: Diagnosis present

## 2024-05-23 DIAGNOSIS — L089 Local infection of the skin and subcutaneous tissue, unspecified: Secondary | ICD-10-CM | POA: Diagnosis present

## 2024-05-23 DIAGNOSIS — T148XXA Other injury of unspecified body region, initial encounter: Secondary | ICD-10-CM | POA: Diagnosis not present

## 2024-05-23 DIAGNOSIS — E1122 Type 2 diabetes mellitus with diabetic chronic kidney disease: Secondary | ICD-10-CM

## 2024-05-23 DIAGNOSIS — Z87891 Personal history of nicotine dependence: Secondary | ICD-10-CM

## 2024-05-23 DIAGNOSIS — E039 Hypothyroidism, unspecified: Secondary | ICD-10-CM | POA: Diagnosis present

## 2024-05-23 DIAGNOSIS — M86172 Other acute osteomyelitis, left ankle and foot: Secondary | ICD-10-CM | POA: Diagnosis not present

## 2024-05-23 DIAGNOSIS — B952 Enterococcus as the cause of diseases classified elsewhere: Secondary | ICD-10-CM | POA: Diagnosis not present

## 2024-05-23 DIAGNOSIS — S91002A Unspecified open wound, left ankle, initial encounter: Secondary | ICD-10-CM

## 2024-05-23 DIAGNOSIS — Z888 Allergy status to other drugs, medicaments and biological substances status: Secondary | ICD-10-CM | POA: Diagnosis not present

## 2024-05-23 DIAGNOSIS — N182 Chronic kidney disease, stage 2 (mild): Secondary | ICD-10-CM

## 2024-05-23 DIAGNOSIS — J449 Chronic obstructive pulmonary disease, unspecified: Secondary | ICD-10-CM | POA: Diagnosis present

## 2024-05-23 DIAGNOSIS — L97323 Non-pressure chronic ulcer of left ankle with necrosis of muscle: Principal | ICD-10-CM

## 2024-05-23 DIAGNOSIS — B965 Pseudomonas (aeruginosa) (mallei) (pseudomallei) as the cause of diseases classified elsewhere: Secondary | ICD-10-CM | POA: Diagnosis present

## 2024-05-23 DIAGNOSIS — Z881 Allergy status to other antibiotic agents status: Secondary | ICD-10-CM

## 2024-05-23 DIAGNOSIS — K219 Gastro-esophageal reflux disease without esophagitis: Secondary | ICD-10-CM | POA: Diagnosis present

## 2024-05-23 DIAGNOSIS — I129 Hypertensive chronic kidney disease with stage 1 through stage 4 chronic kidney disease, or unspecified chronic kidney disease: Secondary | ICD-10-CM | POA: Diagnosis not present

## 2024-05-23 DIAGNOSIS — I96 Gangrene, not elsewhere classified: Secondary | ICD-10-CM | POA: Diagnosis present

## 2024-05-23 DIAGNOSIS — L97529 Non-pressure chronic ulcer of other part of left foot with unspecified severity: Secondary | ICD-10-CM | POA: Diagnosis not present

## 2024-05-23 LAB — COMPREHENSIVE METABOLIC PANEL WITH GFR
ALT: 24 U/L (ref 0–44)
AST: 48 U/L — ABNORMAL HIGH (ref 15–41)
Albumin: 2.9 g/dL — ABNORMAL LOW (ref 3.5–5.0)
Alkaline Phosphatase: 99 U/L (ref 38–126)
Anion gap: 8 (ref 5–15)
BUN: 21 mg/dL (ref 8–23)
CO2: 25 mmol/L (ref 22–32)
Calcium: 8.6 mg/dL — ABNORMAL LOW (ref 8.9–10.3)
Chloride: 102 mmol/L (ref 98–111)
Creatinine, Ser: 1.04 mg/dL (ref 0.61–1.24)
GFR, Estimated: >60 mL/min (ref >60)
Glucose, Bld: 87 mg/dL (ref 70–99)
Potassium: 4.5 mmol/L (ref 3.5–5.1)
Sodium: 135 mmol/L (ref 135–145)
Total Bilirubin: 0.8 mg/dL (ref 0.0–1.2)
Total Protein: 7.7 g/dL (ref 6.5–8.1)

## 2024-05-23 LAB — CBC WITH DIFFERENTIAL/PLATELET
Abs Immature Granulocytes: 0.02 K/uL (ref 0.00–0.07)
Basophils Absolute: 0.1 K/uL (ref 0.0–0.1)
Basophils Relative: 1 %
Eosinophils Absolute: 0.2 K/uL (ref 0.0–0.5)
Eosinophils Relative: 3 %
HCT: 32.8 % — ABNORMAL LOW (ref 39.0–52.0)
Hemoglobin: 11.1 g/dL — ABNORMAL LOW (ref 13.0–17.0)
Immature Granulocytes: 0 %
Lymphocytes Relative: 16 %
Lymphs Abs: 1.4 K/uL (ref 0.7–4.0)
MCH: 32.1 pg (ref 26.0–34.0)
MCHC: 33.8 g/dL (ref 30.0–36.0)
MCV: 94.8 fL (ref 80.0–100.0)
Monocytes Absolute: 1 K/uL (ref 0.1–1.0)
Monocytes Relative: 12 %
Neutro Abs: 5.8 K/uL (ref 1.7–7.7)
Neutrophils Relative %: 68 %
Platelets: 495 K/uL — ABNORMAL HIGH (ref 150–400)
RBC: 3.46 MIL/uL — ABNORMAL LOW (ref 4.22–5.81)
RDW: 16.1 % — ABNORMAL HIGH (ref 11.5–15.5)
WBC: 8.4 K/uL (ref 4.0–10.5)
nRBC: 0 % (ref 0.0–0.2)

## 2024-05-23 SURGERY — INCISION AND DRAINAGE OF DEEP ABSCESS, ANKLE
Anesthesia: General | Laterality: Left

## 2024-05-23 MED ORDER — ACETAMINOPHEN 10 MG/ML IV SOLN
INTRAVENOUS | Status: AC
  Filled 2024-05-23: qty 100

## 2024-05-23 MED ORDER — FENTANYL CITRATE (PF) 100 MCG/2ML IJ SOLN
INTRAMUSCULAR | Status: AC
  Filled 2024-05-23: qty 2

## 2024-05-23 MED ORDER — PANTOPRAZOLE SODIUM 40 MG PO TBEC
40.0000 mg | DELAYED_RELEASE_TABLET | Freq: Every day | ORAL | Status: DC
  Filled 2024-05-23: qty 1

## 2024-05-23 MED ORDER — DROPERIDOL 2.5 MG/ML IJ SOLN: 0.6250 mg | Freq: Once | INTRAMUSCULAR | Status: DC | PRN

## 2024-05-23 MED ORDER — LACTATED RINGERS IV SOLN: INTRAVENOUS | Status: DC | PRN

## 2024-05-23 MED ORDER — DOCUSATE SODIUM 100 MG PO CAPS
100.0000 mg | ORAL_CAPSULE | Freq: Two times a day (BID) | ORAL | Status: DC
  Administered 2024-05-23 – 2024-05-30 (×13): 100 mg via ORAL
  Filled 2024-05-23 (×13): qty 1

## 2024-05-23 MED ORDER — ONDANSETRON HCL 4 MG PO TABS
4.0000 mg | ORAL_TABLET | Freq: Four times a day (QID) | ORAL | Status: DC | PRN
  Administered 2024-05-25: 4 mg via ORAL
  Filled 2024-05-23: qty 1

## 2024-05-23 MED ORDER — VANCOMYCIN HCL IN DEXTROSE 1-5 GM/200ML-% IV SOLN
1000.0000 mg | Freq: Two times a day (BID) | INTRAVENOUS | Status: AC
  Administered 2024-05-23: 1000 mg via INTRAVENOUS
  Filled 2024-05-23: qty 200

## 2024-05-23 MED ORDER — OMEPRAZOLE MAGNESIUM 20 MG PO TBEC: 20.0000 mg | DELAYED_RELEASE_TABLET | Freq: Every day | ORAL | Status: DC

## 2024-05-23 MED ORDER — VITAMIN C 500 MG PO TABS
1000.0000 mg | ORAL_TABLET | Freq: Every morning | ORAL | Status: DC
  Administered 2024-05-24 – 2024-05-30 (×7): 1000 mg via ORAL
  Filled 2024-05-23 (×8): qty 2

## 2024-05-23 MED ORDER — FENTANYL CITRATE (PF) 250 MCG/5ML IJ SOLN
INTRAMUSCULAR | Status: DC | PRN
  Administered 2024-05-23 (×2): 50 ug via INTRAVENOUS

## 2024-05-23 MED ORDER — HYDROMORPHONE HCL 1 MG/ML IJ SOLN
0.5000 mg | INTRAMUSCULAR | Status: DC | PRN
  Administered 2024-05-24 – 2024-05-29 (×11): 1 mg via INTRAVENOUS
  Filled 2024-05-23 (×11): qty 1

## 2024-05-23 MED ORDER — PANTOPRAZOLE SODIUM 40 MG PO TBEC
40.0000 mg | DELAYED_RELEASE_TABLET | Freq: Every day | ORAL | Status: DC
  Administered 2024-05-24 – 2024-05-30 (×6): 40 mg via ORAL
  Filled 2024-05-23 (×6): qty 1

## 2024-05-23 MED ORDER — PIPERACILLIN-TAZOBACTAM 3.375 G IVPB
3.3750 g | Freq: Three times a day (TID) | INTRAVENOUS | Status: DC
  Administered 2024-05-23 – 2024-05-26 (×9): 3.375 g via INTRAVENOUS
  Filled 2024-05-23 (×9): qty 50

## 2024-05-23 MED ORDER — HYDROXYZINE HCL 25 MG PO TABS: 25.0000 mg | ORAL_TABLET | Freq: Three times a day (TID) | ORAL | Status: DC | PRN

## 2024-05-23 MED ORDER — LIDOCAINE 2% (20 MG/ML) 5 ML SYRINGE
INTRAMUSCULAR | Status: DC | PRN
  Administered 2024-05-23: 40 mg via INTRAVENOUS

## 2024-05-23 MED ORDER — ACETAMINOPHEN 500 MG PO TABS
1000.0000 mg | ORAL_TABLET | Freq: Four times a day (QID) | ORAL | Status: AC
  Administered 2024-05-23 – 2024-05-24 (×4): 1000 mg via ORAL
  Filled 2024-05-23 (×4): qty 2

## 2024-05-23 MED ORDER — BETAMETHASONE DIPROPIONATE 0.05 % EX CREA: 1.0000 | TOPICAL_CREAM | Freq: Two times a day (BID) | CUTANEOUS | Status: DC | PRN

## 2024-05-23 MED ORDER — PHENYLEPHRINE 80 MCG/ML (10ML) SYRINGE FOR IV PUSH (FOR BLOOD PRESSURE SUPPORT)
PREFILLED_SYRINGE | INTRAVENOUS | Status: DC | PRN
  Administered 2024-05-23: 80 ug via INTRAVENOUS

## 2024-05-23 MED ORDER — LACTATED RINGERS IV SOLN: INTRAVENOUS | Status: DC

## 2024-05-23 MED ORDER — HYDROMORPHONE HCL 1 MG/ML IJ SOLN
INTRAMUSCULAR | Status: AC
  Filled 2024-05-23: qty 1

## 2024-05-23 MED ORDER — ACETAMINOPHEN 325 MG PO TABS: 325.0000 mg | ORAL_TABLET | Freq: Four times a day (QID) | ORAL | Status: DC | PRN

## 2024-05-23 MED ORDER — OXYCODONE HCL 5 MG PO TABS
10.0000 mg | ORAL_TABLET | ORAL | Status: DC | PRN
  Administered 2024-05-23 – 2024-05-30 (×22): 15 mg via ORAL
  Filled 2024-05-23 (×21): qty 3

## 2024-05-23 MED ORDER — METHOCARBAMOL 500 MG PO TABS
500.0000 mg | ORAL_TABLET | Freq: Four times a day (QID) | ORAL | Status: DC | PRN
  Administered 2024-05-23 – 2024-05-30 (×12): 500 mg via ORAL
  Filled 2024-05-23 (×13): qty 1

## 2024-05-23 MED ORDER — CHLORHEXIDINE GLUCONATE 0.12 % MT SOLN
OROMUCOSAL | Status: AC
  Filled 2024-05-23: qty 15

## 2024-05-23 MED ORDER — METHOCARBAMOL 1000 MG/10ML IJ SOLN
500.0000 mg | Freq: Four times a day (QID) | INTRAMUSCULAR | Status: DC | PRN
  Administered 2024-05-26 – 2024-05-27 (×3): 500 mg via INTRAVENOUS
  Filled 2024-05-23 (×3): qty 10

## 2024-05-23 MED ORDER — DEXAMETHASONE SOD PHOSPHATE PF 10 MG/ML IJ SOLN
INTRAMUSCULAR | Status: DC | PRN
  Administered 2024-05-23: 5 mg via INTRAVENOUS

## 2024-05-23 MED ORDER — ACETAMINOPHEN 10 MG/ML IV SOLN
1000.0000 mg | Freq: Once | INTRAVENOUS | Status: DC | PRN
  Administered 2024-05-23: 1000 mg via INTRAVENOUS

## 2024-05-23 MED ORDER — METOCLOPRAMIDE HCL 5 MG PO TABS: 5.0000 mg | ORAL_TABLET | Freq: Three times a day (TID) | ORAL | Status: DC | PRN

## 2024-05-23 MED ORDER — MELATONIN 5 MG PO TABS
10.0000 mg | ORAL_TABLET | Freq: Every day | ORAL | Status: DC
  Administered 2024-05-23 – 2024-05-29 (×7): 10 mg via ORAL
  Filled 2024-05-23 (×7): qty 2

## 2024-05-23 MED ORDER — ORAL CARE MOUTH RINSE: 15.0000 mL | Freq: Once | OROMUCOSAL | Status: DC

## 2024-05-23 MED ORDER — OXYCODONE HCL 5 MG PO TABS
ORAL_TABLET | ORAL | Status: AC
  Filled 2024-05-23: qty 3

## 2024-05-23 MED ORDER — OXYCODONE HCL 5 MG PO TABS: 5.0000 mg | ORAL_TABLET | Freq: Once | ORAL | Status: DC | PRN

## 2024-05-23 MED ORDER — ONDANSETRON HCL 4 MG/2ML IJ SOLN
INTRAMUSCULAR | Status: DC | PRN
  Administered 2024-05-23: 4 mg via INTRAVENOUS

## 2024-05-23 MED ORDER — FENTANYL CITRATE (PF) 100 MCG/2ML IJ SOLN
25.0000 ug | INTRAMUSCULAR | Status: DC | PRN
  Administered 2024-05-23 (×3): 50 ug via INTRAVENOUS

## 2024-05-23 MED ORDER — METOCLOPRAMIDE HCL 5 MG/ML IJ SOLN: 5.0000 mg | Freq: Three times a day (TID) | INTRAMUSCULAR | Status: DC | PRN

## 2024-05-23 MED ORDER — SODIUM CHLORIDE 0.9 % IV SOLN: INTRAVENOUS | Status: AC

## 2024-05-23 MED ORDER — CHLORHEXIDINE GLUCONATE 0.12 % MT SOLN: 15.0000 mL | Freq: Once | OROMUCOSAL | Status: DC

## 2024-05-23 MED ORDER — HYDROMORPHONE HCL 1 MG/ML IJ SOLN
INTRAMUSCULAR | Status: DC | PRN
  Administered 2024-05-23 (×2): .5 mg via INTRAVENOUS

## 2024-05-23 MED ORDER — TRIAMCINOLONE ACETONIDE 0.1 % EX CREA
1.0000 | TOPICAL_CREAM | Freq: Every day | CUTANEOUS | Status: DC
  Administered 2024-05-24 – 2024-05-30 (×6): 1 via TOPICAL
  Filled 2024-05-23: qty 15

## 2024-05-23 MED ORDER — OXYCODONE HCL 5 MG PO TABS
5.0000 mg | ORAL_TABLET | ORAL | Status: DC | PRN
  Filled 2024-05-23: qty 2
  Filled 2024-05-23: qty 1

## 2024-05-23 MED ORDER — DOXEPIN HCL 10 MG PO CAPS: 10.0000 mg | ORAL_CAPSULE | Freq: Every evening | ORAL | Status: DC | PRN

## 2024-05-23 MED ORDER — CEFAZOLIN SODIUM-DEXTROSE 2-4 GM/100ML-% IV SOLN
2.0000 g | INTRAVENOUS | Status: AC
  Administered 2024-05-23: 2 g via INTRAVENOUS
  Filled 2024-05-23: qty 100

## 2024-05-23 MED ORDER — DIPHENHYDRAMINE HCL 25 MG PO CAPS
25.0000 mg | ORAL_CAPSULE | Freq: Four times a day (QID) | ORAL | Status: DC | PRN
  Administered 2024-05-24 – 2024-05-28 (×6): 25 mg via ORAL
  Filled 2024-05-23 (×6): qty 1

## 2024-05-23 MED ORDER — PROPOFOL 10 MG/ML IV BOLUS
INTRAVENOUS | Status: DC | PRN
  Administered 2024-05-23: 140 mg via INTRAVENOUS

## 2024-05-23 MED ORDER — ONDANSETRON HCL 4 MG/2ML IJ SOLN: 4.0000 mg | Freq: Four times a day (QID) | INTRAMUSCULAR | Status: DC | PRN

## 2024-05-23 MED ORDER — OXYCODONE HCL 5 MG/5ML PO SOLN: 5.0000 mg | Freq: Once | ORAL | Status: DC | PRN

## 2024-05-23 MED ORDER — MAGNESIUM 200 MG PO TABS: ORAL_TABLET | Freq: Every day | ORAL | Status: DC

## 2024-05-23 MED ORDER — TORSEMIDE 20 MG PO TABS: 20.0000 mg | ORAL_TABLET | Freq: Every day | ORAL | Status: DC | PRN

## 2024-05-23 MED ORDER — MAGNESIUM OXIDE -MG SUPPLEMENT 400 (240 MG) MG PO TABS
200.0000 mg | ORAL_TABLET | Freq: Every day | ORAL | Status: DC
  Administered 2024-05-23 – 2024-05-29 (×7): 200 mg via ORAL
  Filled 2024-05-23 (×7): qty 1

## 2024-05-23 MED ADMIN — VASHE WOUND IRRIGATION OPTIME: 34 [oz_av] | NDC 99999080177

## 2024-05-23 SURGICAL SUPPLY — 39 items
BAG COUNTER SPONGE SURGICOUNT (BAG) IMPLANT
BLADE SURG 21 STRL SS (BLADE) ×1 IMPLANT
BNDG COHESIVE 4X5 TAN STRL LF (GAUZE/BANDAGES/DRESSINGS) IMPLANT
BNDG COHESIVE 6X5 TAN NS LF (GAUZE/BANDAGES/DRESSINGS) IMPLANT
BNDG COHESIVE 6X5 TAN ST LF (GAUZE/BANDAGES/DRESSINGS) IMPLANT
BNDG GAUZE DERMACEA FLUFF 4 (GAUZE/BANDAGES/DRESSINGS) IMPLANT
CANISTER WOUNDNEG PRESSURE 500 (CANNISTER) IMPLANT
CLEANSER WND VASHE 34 (WOUND CARE) IMPLANT
CLEANSER WND VASHE INSTL 34OZ (WOUND CARE) IMPLANT
COVER SURGICAL LIGHT HANDLE (MISCELLANEOUS) ×2 IMPLANT
DRAPE DERMATAC (DRAPES) IMPLANT
DRAPE U-SHAPE 47X51 STRL (DRAPES) ×1 IMPLANT
DRESSING PREVENA PLUS CUSTOM (GAUZE/BANDAGES/DRESSINGS) IMPLANT
DRESSING VERAFLO CLEANS CC MED (GAUZE/BANDAGES/DRESSINGS) IMPLANT
DRSG ADAPTIC 3X8 NADH LF (GAUZE/BANDAGES/DRESSINGS) ×1 IMPLANT
DRSG VAC PEEL AND PLACE LRG (GAUZE/BANDAGES/DRESSINGS) IMPLANT
DURAPREP 26ML APPLICATOR (WOUND CARE) ×1 IMPLANT
ELECTRODE REM PT RTRN 9FT ADLT (ELECTROSURGICAL) IMPLANT
GAUZE PAD ABD 8X10 STRL (GAUZE/BANDAGES/DRESSINGS) IMPLANT
GAUZE SPONGE 4X4 12PLY STRL (GAUZE/BANDAGES/DRESSINGS) IMPLANT
GLOVE BIOGEL PI IND STRL 9 (GLOVE) ×1 IMPLANT
GLOVE BIOGEL PI ORTHO SZ9 (GLOVE) ×1 IMPLANT
GOWN STRL REUS W/ TWL XL LVL3 (GOWN DISPOSABLE) ×2 IMPLANT
GRAFT SKIN WND MICRO 38 (Tissue) IMPLANT
KIT BASIN OR (CUSTOM PROCEDURE TRAY) ×1 IMPLANT
KIT TURNOVER KIT B (KITS) ×1 IMPLANT
MANIFOLD NEPTUNE II (INSTRUMENTS) ×1 IMPLANT
PACK ORTHO EXTREMITY (CUSTOM PROCEDURE TRAY) ×1 IMPLANT
PAD ARMBOARD POSITIONER FOAM (MISCELLANEOUS) ×2 IMPLANT
PAD NEG PRESSURE SENSATRAC (MISCELLANEOUS) IMPLANT
SET HNDPC FAN SPRY TIP SCT (DISPOSABLE) IMPLANT
SOLN 0.9% NACL POUR BTL 1000ML (IV SOLUTION) ×1 IMPLANT
STOCKINETTE IMPERVIOUS 9X36 MD (GAUZE/BANDAGES/DRESSINGS) IMPLANT
SUT ETHILON 2 0 PSLX (SUTURE) ×1 IMPLANT
SWAB COLLECTION DEVICE MRSA (MISCELLANEOUS) ×1 IMPLANT
SWAB CULTURE ESWAB REG 1ML (MISCELLANEOUS) IMPLANT
TOWEL GREEN STERILE (TOWEL DISPOSABLE) ×1 IMPLANT
TUBE CONNECTING 12X1/4 (SUCTIONS) ×1 IMPLANT
YANKAUER SUCT BULB TIP NO VENT (SUCTIONS) ×1 IMPLANT

## 2024-05-23 NOTE — Anesthesia Preprocedure Evaluation (Addendum)
 Anesthesia Evaluation  Patient identified by MRN, date of birth, ID band Patient awake    Reviewed: Allergy & Precautions, NPO status , Patient's Chart, lab work & pertinent test results  Airway Mallampati: II  TM Distance: >3 FB Neck ROM: Full    Dental  (+) Partial Upper, Lower Dentures   Pulmonary COPD, former smoker   breath sounds clear to auscultation       Cardiovascular hypertension, Pt. on medications  Rhythm:Regular Rate:Normal     Neuro/Psych  Neuromuscular disease  negative psych ROS   GI/Hepatic Neg liver ROS, hiatal hernia,GERD  Medicated,,  Endo/Other  diabetesHypothyroidism    Renal/GU Renal disease     Musculoskeletal  (+) Arthritis ,    Abdominal   Peds  Hematology  (+) Blood dyscrasia, anemia   Anesthesia Other Findings   Reproductive/Obstetrics                              Anesthesia Physical Anesthesia Plan  ASA: 3  Anesthesia Plan: General   Post-op Pain Management: Tylenol  PO (pre-op)*   Induction: Intravenous  PONV Risk Score and Plan: 3 and Ondansetron , Dexamethasone  and Midazolam   Airway Management Planned: LMA  Additional Equipment: None  Intra-op Plan:   Post-operative Plan: Extubation in OR  Informed Consent: I have reviewed the patients History and Physical, chart, labs and discussed the procedure including the risks, benefits and alternatives for the proposed anesthesia with the patient or authorized representative who has indicated his/her understanding and acceptance.     Dental advisory given  Plan Discussed with: CRNA  Anesthesia Plan Comments:          Anesthesia Quick Evaluation

## 2024-05-23 NOTE — Progress Notes (Signed)
 Orthopedic Tech Progress Note Patient Details:  JAYRON MAQUEDA September 19, 1940 [id number]  Ortho Devices Type of Ortho Device: Postop shoe/boot Ortho Device/Splint Location: LLE/left at bedside Ortho Device/Splint Interventions: Ordered   Post Interventions Patient Tolerated: Fair Instructions Provided: Adjustment of device, Care of device  Adine MARLA Blush 05/23/2024, 7:43 PM

## 2024-05-23 NOTE — Op Note (Signed)
 05/23/2024  2:49 PM  PATIENT:  [name] Fernandez    PRE-OPERATIVE DIAGNOSIS:  Non-healing Left Ankle Wound  POST-OPERATIVE DIAGNOSIS:  Same  PROCEDURE: Excisional debridement of skin and soft tissue muscle fascia and tendon lateral left ankle. Application Kerecis micro graft 38 cm to cover wound surface area greater than 60 cm. Tissue sent for aerobic and anaerobic cultures. Application of the cleanse choice wound VAC sponge and the peel in place wound VAC sponge. SURGEON:  Jerona LULLA Sage, MD  PHYSICIAN ASSISTANT:None ANESTHESIA:   General  PREOPERATIVE INDICATIONS:  [name] Fernandez is a  83 y.o. male with a diagnosis of Non-healing Left Ankle Wound who failed conservative measures and elected for surgical management.    The risks benefits and alternatives were discussed with the patient preoperatively including but not limited to the risks of infection, bleeding, nerve injury, cardiopulmonary complications, the need for revision surgery, among others, and the patient was willing to proceed.  OPERATIVE IMPLANTS:   Implant Name Type Inv. Item Serial No. Manufacturer Lot No. LRB No. Used Action  GRAFT SKIN WND MICRO 38 - [id number] Tissue GRAFT SKIN WND MICRO 38  [employer] [id number] Left 1 Implanted    @ENCIMAGES @  OPERATIVE FINDINGS: Necrotic tissue sent for cultures.  OPERATIVE PROCEDURE: Patient was brought the operating room and underwent a general anesthetic.  After adequate levels anesthesia were obtained patient's left lower extremity was prepped using DuraPrep draped into a sterile field a timeout was called.  A 21 blade knife and rondure were used to excise skin soft tissue muscle fascia and tendon.  This was debrided back to bleeding viable healthy tissue.  Electrocautery was used for hemostasis the wound was cleansed with Vashe.  The wound bed that was 6 x 10 cm was covered with 38 cm of Kerecis micro graft for soft tissue reinforcement.  The wound was covered with the  cleanse choice wound VAC sponge this was overwrapped with a peel in place sponge this had a good suction fit patient had this overwrapped with a Coban dressing extubated taken the PACU in stable condition.   DISCHARGE PLANNING:  Antibiotic duration: Continue IV antibiotics adjust according to culture sensitivities  Weightbearing: Weightbearing as tolerated on the left  Pain medication: Opioid pathway  Dressing care/ Wound VAC: Continue wound VAC for 1 week  Ambulatory devices: Walker or crutches  Discharge to: Plan for return to the operating room on Wednesday for repeat debridement  Follow-up: In the office 1 week post operative.

## 2024-05-23 NOTE — Transfer of Care (Signed)
 Immediate Anesthesia Transfer of Care Note  Patient: [name] Fernandez  Procedure(s) Performed: INCISION AND DRAINAGE OF DEEP ABSCESS, ANKLE (Left)  Patient Location: PACU  Anesthesia Type:General  Level of Consciousness: awake, alert , oriented, and patient cooperative  Airway & Oxygen Therapy: Patient Spontanous Breathing and Patient connected to face mask oxygen  Post-op Assessment: Report given to RN, Post -op Vital signs reviewed and stable, Patient moving all extremities, and Patient moving all extremities X 4  Post vital signs: Reviewed and stable  Last Vitals:  Vitals Value Taken Time  BP 136/78 05/23/24 14:45  Temp    Pulse 74 05/23/24 14:46  Resp 15 05/23/24 14:46  SpO2 100 % 05/23/24 14:46  Vitals shown include unfiled device data.  Last Pain:  Vitals:   05/23/24 1137  TempSrc: (P) Oral  PainSc: (P) 9          Complications: No notable events documented.

## 2024-05-23 NOTE — Interval H&P Note (Signed)
 History and Physical Interval Note:  05/23/2024 1:19 PM  [name] Fernandez  has presented today for surgery, with the diagnosis of Non-healing Left Ankle Wound.  The various methods of treatment have been discussed with the patient and family. After consideration of risks, benefits and other options for treatment, the patient has consented to  Procedures with comments: INCISION AND DRAINAGE OF DEEP ABSCESS, ANKLE (Left) - LEFT ANKLE IRRIGATION AND DEBRIDEMENT as a surgical intervention.  The patient's history has been reviewed, patient examined, no change in status, stable for surgery.  I have reviewed the patient's chart and labs.  Questions were answered to the patient's satisfaction.     Tico Crotteau V Dominie Benedick

## 2024-05-23 NOTE — Anesthesia Procedure Notes (Signed)
 Procedure Name: LMA Insertion Date/Time: 05/23/2024 2:07 PM  Performed by: Arvell Edsel HERO, CRNAPre-anesthesia Checklist: Patient identified, Emergency Drugs available, Suction available, Patient being monitored and Timeout performed Patient Re-evaluated:Patient Re-evaluated prior to induction Oxygen Delivery Method: Circle system utilized Preoxygenation: Pre-oxygenation with 100% oxygen Induction Type: IV induction LMA: LMA inserted LMA Size: 4.0 Number of attempts: 1 Placement Confirmation: positive ETCO2 and breath sounds checked- equal and bilateral Tube secured with: Tape Dental Injury: Teeth and Oropharynx as per pre-operative assessment

## 2024-05-24 LAB — GLUCOSE, CAPILLARY: Glucose-Capillary: 111 mg/dL — ABNORMAL HIGH (ref 70–99)

## 2024-05-24 NOTE — Progress Notes (Signed)
 Patient ID: [name] Fernandez, male   DOB: Sep 26, 1940, 83 y.o.   MRN: [id number] Patient is postoperative day 1 debridement ulcer lateral left ankle.  Cultures are showing gram-negative rods.  Will continue Zosyn  until cultures are finalized.  Plan to return to the operating room on Wednesday for repeat debridement and tissue grafting.  Diet changed to regular as per patient request.

## 2024-05-24 NOTE — Anesthesia Postprocedure Evaluation (Signed)
 Anesthesia Post Note  Patient: [name] Fernandez  Procedure(s) Performed: INCISION AND DRAINAGE OF DEEP ABSCESS, ANKLE (Left)     Patient location during evaluation: PACU Anesthesia Type: General Level of consciousness: awake and alert Pain management: pain level controlled Vital Signs Assessment: post-procedure vital signs reviewed and stable Respiratory status: spontaneous breathing, nonlabored ventilation, respiratory function stable and patient connected to nasal cannula oxygen Cardiovascular status: blood pressure returned to baseline and stable Postop Assessment: no apparent nausea or vomiting Anesthetic complications: no   No notable events documented.              Delvin Hedeen D Beatris Belen

## 2024-05-24 NOTE — Plan of Care (Signed)

## 2024-05-24 NOTE — Evaluation (Signed)
 Physical Therapy Evaluation Patient Details Name: [name] Fernandez MRN: [id number] DOB: 04/13/1941 Today's Date: 05/24/2024  History of Present Illness  Patient is 83 yo male admitted on 05/23/24 for debridement of ulcer on L lateral ankle. Plan to return to OR on 12/17.   Noted that patient has an acute L5 compression fracture that is currently being treated conservatively until L ankle wound has been addressed. PMH significant for hypertension, celiac disease, HLD, chronic left ankle ulcer/chronic venous insufficiency, DM-2, GERD-s/p Nissen's fundoplication. 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  Clinical Impression  Prior to admission, patient lives with spouse in one level home. Patient recently injured low back sustaining L5 compression fracture which seems to be his main concern at the moment. Patient presents with generalized weakness, low back pain, and mild functional impairments. Patient completes bed mobility and sit to stand transfers with supervision. Able to ambulation 70 feet with 2WW and CGA. PT educated patient on general spine precautions, use of log rolling, and importance of frequent change in position to minimize worsening of low back pain during hospitalization. Plan to return to OR later this week. Recommend PT follow up after procedure and likely will d/c from skilled services if no further needs. Recommend HHPT at this time however, may progress to d/c without any PT needs.         If plan is discharge home, recommend the following: A little help with walking and/or transfers;A little help with bathing/dressing/bathroom;Help with stairs or ramp for entrance;Assistance with cooking/housework   Can travel by private vehicle        Equipment Recommendations None recommended by PT  Recommendations for Other Services       Functional Status Assessment  Patient has had a recent decline in their functional status and demonstrates the ability to make significant improvements in function in a reasonable and predictable amount of time.     Precautions / Restrictions Precautions Precautions: Fall Recall of Precautions/Restrictions: Intact Precaution/Restrictions Comments: post op shoe L LE Restrictions Weight Bearing Restrictions Per Provider Order: Yes LLE Weight Bearing Per Provider Order: Weight bearing as tolerated      Mobility  Bed Mobility Overal bed mobility: Needs Assistance Bed Mobility: Supine to Sit     Supine to sit: Supervision     General bed mobility comments: HOB elevated, cues for log rolling    Transfers Overall transfer level: Needs assistance Equipment used: Rolling walker (2 wheels) Transfers: Sit to/from Stand Sit to Stand: Supervision           General transfer comment: sit to stand from EOB to 2WW with cues for hand placement and bed height raised to minimize pressure on low back    Ambulation/Gait Ambulation/Gait assistance: Contact guard assist Gait Distance (Feet): 70 Feet Assistive device: Rolling walker (2 wheels)         General Gait Details: reduced step length LLE, forward flexed posture  Stairs            Wheelchair Mobility     Tilt Bed    Modified Rankin (Stroke Patients Only)       Balance Overall balance assessment: Mild deficits observed, not formally tested  Pertinent Vitals/Pain Pain Assessment Pain Assessment: Faces Faces Pain Scale: Hurts a little bit Pain Location: low back Pain Intervention(s): Monitored during session, Premedicated before session    Home Living Family/patient expects to be discharged to:: Private residence Living Arrangements: Spouse/significant other Available Help at Discharge: Family;Available 24 hours/day Type of Home: House Home Access: Ramped entrance        Home Layout: One level Home Equipment: Cane - single point;Rollator (4 wheels);Rolling Walker (2 wheels)      Prior Function Prior Level of Function : Independent/Modified Independent;Driving             Mobility Comments: Patient uses walker inside home and SPC when ambulating in community. Limited by low back pain from acute L5 fracture from lifting injury.       Extremity/Trunk Assessment        Lower Extremity Assessment Lower Extremity Assessment: Generalized weakness    Cervical / Trunk Assessment Cervical / Trunk Assessment: Kyphotic  Communication   Communication Communication: No apparent difficulties    Cognition Arousal: Alert Behavior During Therapy: WFL for tasks assessed/performed   PT - Cognitive impairments: No apparent impairments                         Following commands: Intact       Cueing Cueing Techniques: Verbal cues     General Comments General comments (skin integrity, edema, etc.): education on general spine precautions (no bending/lifting/twisting)    Exercises     Assessment/Plan    PT Assessment Patient needs continued PT services  PT Problem List Decreased strength;Decreased balance;Decreased activity tolerance;Decreased safety awareness;Decreased knowledge of use of DME;Decreased mobility       PT Treatment Interventions DME instruction;Gait training;Therapeutic activities;Functional mobility training;Balance training;Therapeutic exercise;Patient/family education    PT Goals (Current goals can be found in the Care Plan section)  Acute Rehab PT Goals Patient Stated Goal: Patient's goal is to return home and be able to have procedure on low back. PT Goal Formulation: With patient Time For Goal Achievement: 06/07/24 Potential to Achieve Goals: Good    Frequency Min 1X/week     Co-evaluation               AM-PAC PT 6 Clicks Mobility  Outcome Measure Help needed turning from your back to your side  while in a flat bed without using bedrails?: A Little Help needed moving from lying on your back to sitting on the side of a flat bed without using bedrails?: A Little Help needed moving to and from a bed to a chair (including a wheelchair)?: A Little Help needed standing up from a chair using your arms (e.g., wheelchair or bedside chair)?: A Little Help needed to walk in hospital room?: A Little Help needed climbing 3-5 steps with a railing? : A Lot 6 Click Score: 17    End of Session Equipment Utilized During Treatment: Gait belt;Other (comment) (L post op shoe) Activity Tolerance: Patient tolerated treatment well Patient left: in bed;with call bell/phone within reach (sitting EOB, admissions discipline present) Nurse Communication: Precautions;Mobility status (recommend larger post op shoe) PT Visit Diagnosis: Other abnormalities of gait and mobility (R26.89);Muscle weakness (generalized) (M62.81)    Time: 8565-8492 PT Time Calculation (min) (ACUTE ONLY): 33 min   Charges:   PT Evaluation $PT Eval Low Complexity: 1 Low PT Treatments $Therapeutic Activity: 8-22 mins PT General Charges $$ ACUTE PT VISIT: 1 Visit  Sherryle Sparta, PT, DPT [facility] Acute Rehabilitation Office: [phone]   Sherryle VEAR [place] 05/24/2024, 3:29 PM

## 2024-05-25 NOTE — Progress Notes (Signed)
 Patient ID: [name] Fernandez, male   DOB: 1941/04/21, 83 y.o.   MRN: [id number] Patient is postoperative day 2 debridement ulcer lateral left ankle.  Cultures continue showing gram-negative rods.  Will continue Zosyn  until cultures are finalized.  Plan remains return to the operating room on Wednesday for repeat debridement and tissue grafting. Doing well today, pain controlled, VAC with good seal.

## 2024-05-25 NOTE — Plan of Care (Signed)
   Problem: Education: Goal: Knowledge of General Education information will improve Description Including pain rating scale, medication(s)/side effects and non-pharmacologic comfort measures Outcome: Progressing   Problem: Health Behavior/Discharge Planning: Goal: Ability to manage health-related needs will improve Outcome: Progressing

## 2024-05-25 NOTE — Plan of Care (Signed)

## 2024-05-26 ENCOUNTER — Encounter (HOSPITAL_COMMUNITY): Payer: Self-pay | Admitting: Orthopedic Surgery

## 2024-05-26 MED ORDER — SODIUM CHLORIDE 0.9 % IV SOLN
1.0000 g | Freq: Three times a day (TID) | INTRAVENOUS | Status: DC
  Administered 2024-05-26 – 2024-05-27 (×5): 1 g via INTRAVENOUS
  Filled 2024-05-26 (×6): qty 20

## 2024-05-26 NOTE — Care Management Important Message (Signed)
 Important Message  Patient Details  Name: GARNIE BORCHARDT MRN: [id number] Date of Birth: 1941/01/19   Important Message Given:  Yes - Medicare IM     Jennie Laneta Dragon 05/26/2024, 3:53 PM

## 2024-05-26 NOTE — Plan of Care (Signed)

## 2024-05-26 NOTE — Progress Notes (Signed)
 Patient ID: [name] Fernandez, male   DOB: [birth year], 83 y.o.   MRN: [id number] Patient is postoperative day 3 debridement left ankle.  Cultures are showing Pseudomonas sensitivities are pending.  There is 200 cc of clear serosanguineous drainage in the wound VAC canister.  Plan for repeat debridement and tissue grafting on Wednesday.

## 2024-05-26 NOTE — Plan of Care (Signed)
  Problem: Pain Managment: Goal: General experience of comfort will improve and/or be controlled Outcome: Progressing   Problem: Safety: Goal: Ability to remain free from injury will improve Outcome: Progressing

## 2024-05-26 NOTE — Evaluation (Signed)
 Occupational Therapy Evaluation Patient Details Name: [name] Fernandez MRN: [id number] DOB: September 08, 1940 Today's Date: 05/26/2024   History of Present Illness   83 yo M adm on 05/23/24 for debridement of ulcer on L lateral ankle. Plan to return to OR on 12/17. Noted that patient has an acute L5 compression fracture that is currently being treated conservatively until L ankle wound has been addressed.PMH HTN celiac disease, HLD, chronic L ankle ulcer/ chronic venous insufficiency, DM2, GERD s/p Nissen's fundoplication, osteoporosis with multiple compression fx ( L1, L2, L3 and L4) , hx kyphoplasty T11-T12,     Clinical Impressions PTA Pt was Mod I with functional mobility and independent with ADL and IADL tasks. Pt currently requires supervision for transfers and up to Mod A for ADL engagement while adhering to back precautions for comfort and safety. Pt is primarily limited by generalized weakness, pain, unsteadiness on feet, and decreased knowledge of AE. OT to continue to follow Pt acutely to facilitate progress towards goals. Pt will benefit from continued education on AE. Anticipate that with acute services, Pt will be able to d/c without OT needs.      If plan is discharge home, recommend the following:   A little help with walking and/or transfers;A little help with bathing/dressing/bathroom;Assistance with cooking/housework;Assist for transportation;Help with stairs or ramp for entrance     Functional Status Assessment   Patient has had a recent decline in their functional status and demonstrates the ability to make significant improvements in function in a reasonable and predictable amount of time.     Equipment Recommendations   BSC/3in1     Recommendations for Other Services         Precautions/Restrictions   Precautions Precautions: Fall Recall of Precautions/Restrictions: Impaired Precaution/Restrictions Comments: post op shoe L LE; no formal back precautions  ordered, educated and followed for Pt safety and comfort. Pt unable to recall precautions. Restrictions Weight Bearing Restrictions Per Provider Order: Yes LLE Weight Bearing Per Provider Order: Weight bearing as tolerated     Mobility Bed Mobility               General bed mobility comments: Pt greeted seated EOB and requested to remain seated EOB at end of session    Transfers Overall transfer level: Needs assistance Equipment used: Rolling walker (2 wheels) Transfers: Sit to/from Stand Sit to Stand: Supervision           General transfer comment: Pt able to stand from bed without use of RW. RW used for short distance mobility in room, Pt stating he does not need RW and leaving RW away from bed for return to seated position EOB.      Balance Overall balance assessment: Needs assistance Sitting-balance support: Single extremity supported, Feet supported Sitting balance-Leahy Scale: Fair     Standing balance support: Bilateral upper extremity supported, No upper extremity supported, During functional activity Standing balance-Leahy Scale: Fair                             ADL either performed or assessed with clinical judgement   ADL Overall ADL's : Needs assistance/impaired Eating/Feeding: Independent   Grooming: Supervision/safety;Standing   Upper Body Bathing: Supervision/ safety;Sitting   Lower Body Bathing: Moderate assistance;Adhering to back precautions;Cueing for back precautions   Upper Body Dressing : Supervision/safety   Lower Body Dressing: Moderate assistance;Cueing for back precautions;Adhering to back precautions   Toilet Transfer: Supervision/safety;Regular Toilet;Rolling walker (2 wheels)  Toileting- Clothing Manipulation and Hygiene: Supervision/safety;Cueing for back precautions;Adhering to back precautions               Vision Patient Visual Report: No change from baseline Vision Assessment?: No apparent visual  deficits     Perception         Praxis         Pertinent Vitals/Pain Pain Assessment Pain Assessment: 0-10 Pain Score: 7  Pain Location: low back Pain Descriptors / Indicators: Discomfort, Grimacing, Guarding Pain Intervention(s): Limited activity within patient's tolerance, Monitored during session     Extremity/Trunk Assessment Upper Extremity Assessment Upper Extremity Assessment: Generalized weakness   Lower Extremity Assessment Lower Extremity Assessment: Defer to PT evaluation   Cervical / Trunk Assessment Cervical / Trunk Assessment: Kyphotic   Communication Communication Communication: No apparent difficulties   Cognition Arousal: Alert Behavior During Therapy: WFL for tasks assessed/performed Cognition: No apparent impairments                               Following commands: Intact       Cueing  General Comments   Cueing Techniques: Verbal cues  Reinforced education on back precautions for safety.   Exercises     Shoulder Instructions      Home Living Family/patient expects to be discharged to:: Private residence Living Arrangements: Spouse/significant other Available Help at Discharge: Family;Available 24 hours/day Type of Home: House Home Access: Ramped entrance     Home Layout: One level     Bathroom Shower/Tub: Tub/shower unit;Walk-in shower   Bathroom Toilet: Standard Bathroom Accessibility: Yes How Accessible: Accessible via walker Home Equipment: Cane - single point;Rollator (4 wheels);Rolling Walker (2 wheels);Shower seat;Hand held shower head          Prior Functioning/Environment Prior Level of Function : Independent/Modified Independent;Driving             Mobility Comments: Patient uses walker inside home and SPC when ambulating in community. Limited by low back pain from acute L5 fracture from lifting injury. ADLs Comments: Reports Independent    OT Problem List: Decreased strength;Decreased  activity tolerance;Impaired balance (sitting and/or standing);Decreased safety awareness;Decreased knowledge of use of DME or AE;Decreased knowledge of precautions;Pain   OT Treatment/Interventions: Self-care/ADL training;Therapeutic exercise;Energy conservation;DME and/or AE instruction;Therapeutic activities;Patient/family education;Balance training      OT Goals(Current goals can be found in the care plan section)   Acute Rehab OT Goals Patient Stated Goal: To eat breakfast OT Goal Formulation: With patient Time For Goal Achievement: 06/09/24 Potential to Achieve Goals: Good ADL Goals Pt Will Perform Grooming: with modified independence;standing (while adhering to back precautions) Pt Will Perform Lower Body Bathing: with modified independence;with adaptive equipment Pt Will Perform Lower Body Dressing: with modified independence;with adaptive equipment Pt Will Transfer to Toilet: with modified independence;ambulating;regular height toilet   OT Frequency:  Min 1X/week    Co-evaluation              AM-PAC OT 6 Clicks Daily Activity     Outcome Measure Help from another person eating meals?: None Help from another person taking care of personal grooming?: A Little Help from another person toileting, which includes using toliet, bedpan, or urinal?: A Little Help from another person bathing (including washing, rinsing, drying)?: A Lot Help from another person to put on and taking off regular upper body clothing?: A Little Help from another person to put on and taking off regular lower body clothing?: A  Lot 6 Click Score: 17   End of Session Equipment Utilized During Treatment: Rolling walker (2 wheels) Nurse Communication: Mobility status  Activity Tolerance: Patient tolerated treatment well Patient left: in bed;with call bell/phone within reach  OT Visit Diagnosis: Unsteadiness on feet (R26.81);Muscle weakness (generalized) (M62.81);Pain Pain - part of body:  (back)                 Time: 9174-9162 OT Time Calculation (min): 12 min Charges:  OT General Charges $OT Visit: 1 Visit OT Evaluation $OT Eval Low Complexity: 1 Low  Maurilio CROME, OTR/L.  [facility] Acute Rehabilitation  Office: [phone]   Maurilio PARAS Iyana Topor 05/26/2024, 8:51 AM

## 2024-05-27 ENCOUNTER — Ambulatory Visit: Admitting: Orthopedic Surgery

## 2024-05-27 LAB — MRSA NEXT GEN BY PCR, NASAL: MRSA by PCR Next Gen: NOT DETECTED

## 2024-05-27 MED ORDER — POVIDONE-IODINE 10 % EX SWAB
2.0000 | Freq: Once | CUTANEOUS | Status: AC
  Administered 2024-05-28: 08:00:00 2 via TOPICAL

## 2024-05-27 MED ORDER — CHLORHEXIDINE GLUCONATE 4 % EX SOLN
60.0000 mL | Freq: Once | CUTANEOUS | Status: AC
  Administered 2024-05-28: 05:00:00 4 via TOPICAL
  Filled 2024-05-27: qty 15

## 2024-05-27 NOTE — H&P (View-Only) (Signed)
 Patient ID: [name] Fernandez, male   DOB: [birth year], 83 y.o.   MRN: [id number] Patient is postoperative day 4 debridement left ankle ulcer.  Cultures are showing resistant Pseudomonas antibiotics changed to meropenem  plan for repeat debridement tomorrow.  Anticipate patient will need a PICC line for discharge to home.

## 2024-05-27 NOTE — Plan of Care (Signed)
  Problem: Education: Goal: Knowledge of General Education information will improve Description: Including pain rating scale, medication(s)/side effects and non-pharmacologic comfort measures Outcome: Progressing   Problem: Health Behavior/Discharge Planning: Goal: Ability to manage health-related needs will improve Outcome: Progressing   Problem: Clinical Measurements: Goal: Ability to maintain clinical measurements within normal limits will improve Outcome: Progressing   Problem: Pain Managment: Goal: General experience of comfort will improve and/or be controlled Outcome: Progressing   Problem: Safety: Goal: Ability to remain free from injury will improve Outcome: Progressing

## 2024-05-27 NOTE — Progress Notes (Signed)
 Patient ID: [name] Fernandez, male   DOB: [birth year], 83 y.o.   MRN: [id number] Patient is postoperative day 4 debridement left ankle ulcer.  Cultures are showing resistant Pseudomonas antibiotics changed to meropenem  plan for repeat debridement tomorrow.  Anticipate patient will need a PICC line for discharge to home.

## 2024-05-27 NOTE — Plan of Care (Signed)

## 2024-05-27 NOTE — Progress Notes (Signed)
 Physical Therapy Treatment Patient Details Name: [name] Fernandez MRN: [id number] DOB: [birth year] Today's Date: 05/27/2024   History of Present Illness 83 yo M adm on 05/23/24 for debridement of ulcer on L lateral ankle. Plan to return to OR on 12/17. Noted that patient has an acute L5 compression fracture that is currently being treated conservatively until L ankle wound has been addressed.PMH HTN celiac disease, HLD, chronic L ankle ulcer/ chronic venous insufficiency, DM2, GERD s/p Nissen's fundoplication, osteoporosis with multiple compression fx ( L1, L2, L3 and L4) , hx kyphoplasty T11-T12,    PT Comments  Pt continues to demonstrate good tolerance to ambulation s/p debridement on L ankle ulcer. Pt reports walking small laps throughout the day with walker. Pt demonstrates appropriate mechanics when using walker and has no LOB or pain with activity. Session limited due to pt's wife arriving and pt requesting to spend time with her. Pt expected to undergo additional debridement tomorrow and would benefit from follow up visit to assess for any change in mobility.  If no change occurs, do not anticipate pt will need further PT services. HH PT remains the rec until final debridement and assessment occurs.    If plan is discharge home, recommend the following: A little help with walking and/or transfers;A little help with bathing/dressing/bathroom;Assist for transportation;Help with stairs or ramp for entrance;Assistance with cooking/housework   Can travel by Training And Development Officer (2 wheels)    Recommendations for Other Services       Precautions / Restrictions Precautions Precautions: Fall Recall of Precautions/Restrictions: Intact Required Braces or Orthoses:  (Post op shoe) Restrictions Weight Bearing Restrictions Per Provider Order: Yes LLE Weight Bearing Per Provider Order: Weight bearing as tolerated     Mobility  Bed Mobility                General bed mobility comments: Pt recieved sitting EOB    Transfers Overall transfer level: Modified independent Equipment used: Rolling walker (2 wheels) Transfers: Sit to/from Stand Sit to Stand: Modified independent (Device/Increase time)           General transfer comment: Pt steady upon standing with rolling walker. Tolerating weight bearing in L LE well. Tolerating bouts of ambulation well.    Ambulation/Gait Ambulation/Gait assistance: Supervision Gait Distance (Feet): 80 Feet (80'x2) Assistive device: Rolling walker (2 wheels) Gait Pattern/deviations: Step-through pattern, Decreased step length - right, Decreased step length - left, Decreased weight shift to left   Gait velocity interpretation: 1.31 - 2.62 ft/sec, indicative of limited Financial Controller     Tilt Bed    Modified Rankin (Stroke Patients Only)       Balance Overall balance assessment: Mild deficits observed, not formally tested Sitting-balance support: No upper extremity supported Sitting balance-Leahy Scale: Normal     Standing balance support: Bilateral upper extremity supported Standing balance-Leahy Scale: Good                              Communication Communication Communication: No apparent difficulties  Cognition Arousal: Alert Behavior During Therapy: WFL for tasks assessed/performed   PT - Cognitive impairments: No apparent impairments  Following commands: Intact      Cueing Cueing Techniques: Verbal cues, Tactile cues, Visual cues  Exercises      General Comments General comments (skin integrity, edema, etc.): No new skin abnormalities noted. L LE wrap C,D,I with drain attached. Incision not visulaized.      Pertinent Vitals/Pain Pain Assessment Pain Assessment: No/denies pain    Home Living                          Prior Function             PT Goals (current goals can now be found in the care plan section) Acute Rehab PT Goals Patient Stated Goal: No additional goals stated during session Progress towards PT goals: Progressing toward goals    Frequency    Min 1X/week      PT Plan      Co-evaluation              AM-PAC PT 6 Clicks Mobility   Outcome Measure  Help needed turning from your back to your side while in a flat bed without using bedrails?: None Help needed moving from lying on your back to sitting on the side of a flat bed without using bedrails?: None Help needed moving to and from a bed to a chair (including a wheelchair)?: None Help needed standing up from a chair using your arms (e.g., wheelchair or bedside chair)?: None Help needed to walk in hospital room?: None Help needed climbing 3-5 steps with a railing? : A Little 6 Click Score: 23    End of Session   Activity Tolerance: Patient tolerated treatment well Patient left: in bed;with family/visitor present;with call bell/phone within reach (Seated EOB) Nurse Communication: Mobility status PT Visit Diagnosis: Unsteadiness on feet (R26.81);Other abnormalities of gait and mobility (R26.89)     Time: 8543-8493 PT Time Calculation (min) (ACUTE ONLY): 10 min  Charges:    $Gait Training: 8-22 mins PT General Charges $$ ACUTE PT VISIT: 1 Visit                     Sabra Morel, PT, DPT  Acute Rehabilitation Services         Office: [phone]      Sabra MARLA Morel 05/27/2024, 4:15 PM

## 2024-05-28 ENCOUNTER — Inpatient Hospital Stay (HOSPITAL_COMMUNITY)

## 2024-05-28 ENCOUNTER — Encounter (HOSPITAL_COMMUNITY): Admission: RE | Disposition: A | Payer: Self-pay | Source: Home / Self Care | Attending: Orthopedic Surgery

## 2024-05-28 DIAGNOSIS — E1122 Type 2 diabetes mellitus with diabetic chronic kidney disease: Secondary | ICD-10-CM

## 2024-05-28 DIAGNOSIS — B965 Pseudomonas (aeruginosa) (mallei) (pseudomallei) as the cause of diseases classified elsewhere: Secondary | ICD-10-CM | POA: Diagnosis not present

## 2024-05-28 DIAGNOSIS — N182 Chronic kidney disease, stage 2 (mild): Secondary | ICD-10-CM | POA: Diagnosis not present

## 2024-05-28 DIAGNOSIS — L97323 Non-pressure chronic ulcer of left ankle with necrosis of muscle: Secondary | ICD-10-CM | POA: Diagnosis not present

## 2024-05-28 DIAGNOSIS — I129 Hypertensive chronic kidney disease with stage 1 through stage 4 chronic kidney disease, or unspecified chronic kidney disease: Secondary | ICD-10-CM

## 2024-05-28 DIAGNOSIS — Z1624 Resistance to multiple antibiotics: Secondary | ICD-10-CM | POA: Diagnosis not present

## 2024-05-28 DIAGNOSIS — T148XXA Other injury of unspecified body region, initial encounter: Secondary | ICD-10-CM

## 2024-05-28 DIAGNOSIS — L089 Local infection of the skin and subcutaneous tissue, unspecified: Secondary | ICD-10-CM

## 2024-05-28 DIAGNOSIS — S91002A Unspecified open wound, left ankle, initial encounter: Secondary | ICD-10-CM

## 2024-05-28 DIAGNOSIS — L97529 Non-pressure chronic ulcer of other part of left foot with unspecified severity: Secondary | ICD-10-CM | POA: Diagnosis not present

## 2024-05-28 DIAGNOSIS — B952 Enterococcus as the cause of diseases classified elsewhere: Secondary | ICD-10-CM

## 2024-05-28 DIAGNOSIS — M86172 Other acute osteomyelitis, left ankle and foot: Secondary | ICD-10-CM

## 2024-05-28 DIAGNOSIS — A498 Other bacterial infections of unspecified site: Secondary | ICD-10-CM | POA: Insufficient documentation

## 2024-05-28 HISTORY — PX: INCISION AND DRAINAGE OF DEEP ABSCESS, ANKLE: SHX7360

## 2024-05-28 SURGERY — INCISION AND DRAINAGE OF DEEP ABSCESS, ANKLE
Anesthesia: General | Site: Ankle | Laterality: Left

## 2024-05-28 MED ORDER — TOBRAMYCIN SULFATE 1.2 G IJ SOLR
INTRAMUSCULAR | Status: DC | PRN
  Administered 2024-05-28: 09:00:00 1.2 g

## 2024-05-28 MED ORDER — PROPOFOL 10 MG/ML IV BOLUS
INTRAVENOUS | Status: DC | PRN
  Administered 2024-05-28: 09:00:00 140 mg via INTRAVENOUS

## 2024-05-28 MED ORDER — AMISULPRIDE (ANTIEMETIC) 5 MG/2ML IV SOLN: 10.0000 mg | Freq: Once | INTRAVENOUS | Status: DC | PRN

## 2024-05-28 MED ORDER — CHLORHEXIDINE GLUCONATE 0.12 % MT SOLN
OROMUCOSAL | Status: AC
  Administered 2024-05-28: 08:00:00 15 mL via OROMUCOSAL
  Filled 2024-05-28: qty 15

## 2024-05-28 MED ORDER — TOBRAMYCIN SULFATE 1.2 G IJ SOLR
INTRAMUSCULAR | Status: AC
  Filled 2024-05-28: qty 1.2

## 2024-05-28 MED ORDER — ALBUTEROL SULFATE HFA 108 (90 BASE) MCG/ACT IN AERS
INHALATION_SPRAY | RESPIRATORY_TRACT | Status: DC | PRN
  Administered 2024-05-28: 09:00:00 4 via RESPIRATORY_TRACT

## 2024-05-28 MED ORDER — LIDOCAINE 2% (20 MG/ML) 5 ML SYRINGE
INTRAMUSCULAR | Status: AC
  Filled 2024-05-28: qty 5

## 2024-05-28 MED ORDER — FENTANYL CITRATE (PF) 100 MCG/2ML IJ SOLN
INTRAMUSCULAR | Status: AC
  Filled 2024-05-28: qty 2

## 2024-05-28 MED ORDER — DEXAMETHASONE SOD PHOSPHATE PF 10 MG/ML IJ SOLN
INTRAMUSCULAR | Status: DC | PRN
  Administered 2024-05-28: 09:00:00 5 mg via INTRAVENOUS

## 2024-05-28 MED ORDER — LACTATED RINGERS IV SOLN: INTRAVENOUS | Status: DC

## 2024-05-28 MED ORDER — PHENYLEPHRINE 80 MCG/ML (10ML) SYRINGE FOR IV PUSH (FOR BLOOD PRESSURE SUPPORT)
PREFILLED_SYRINGE | INTRAVENOUS | Status: DC | PRN
  Administered 2024-05-28: 09:00:00 80 ug via INTRAVENOUS
  Administered 2024-05-28 (×2): 160 ug via INTRAVENOUS
  Administered 2024-05-28 (×2): 80 ug via INTRAVENOUS

## 2024-05-28 MED ORDER — PROPOFOL 10 MG/ML IV BOLUS
INTRAVENOUS | Status: AC
  Filled 2024-05-28: qty 20

## 2024-05-28 MED ORDER — ONDANSETRON HCL 4 MG/2ML IJ SOLN
INTRAMUSCULAR | Status: AC
  Filled 2024-05-28: qty 2

## 2024-05-28 MED ORDER — OXYCODONE HCL 5 MG PO TABS: 5.0000 mg | ORAL_TABLET | Freq: Once | ORAL | Status: DC | PRN

## 2024-05-28 MED ORDER — ONDANSETRON HCL 4 MG/2ML IJ SOLN: 4.0000 mg | Freq: Once | INTRAMUSCULAR | Status: DC | PRN

## 2024-05-28 MED ORDER — ACETAMINOPHEN 10 MG/ML IV SOLN
INTRAVENOUS | Status: AC
  Filled 2024-05-28: qty 100

## 2024-05-28 MED ORDER — ONDANSETRON HCL 4 MG/2ML IJ SOLN
INTRAMUSCULAR | Status: DC | PRN
  Administered 2024-05-28: 09:00:00 4 mg via INTRAVENOUS

## 2024-05-28 MED ORDER — OXYCODONE HCL 5 MG/5ML PO SOLN: 5.0000 mg | Freq: Once | ORAL | Status: DC | PRN

## 2024-05-28 MED ORDER — SODIUM CHLORIDE 0.9 % IV SOLN
2.0000 g | Freq: Three times a day (TID) | INTRAVENOUS | Status: DC
  Administered 2024-05-28 – 2024-05-29 (×3): 2 g via INTRAVENOUS
  Filled 2024-05-28 (×7): qty 40

## 2024-05-28 MED ORDER — HYDROMORPHONE HCL 1 MG/ML IJ SOLN
0.5000 mg | INTRAMUSCULAR | Status: AC | PRN
  Administered 2024-05-28 (×2): 0.5 mg via INTRAVENOUS

## 2024-05-28 MED ORDER — FENTANYL CITRATE (PF) 250 MCG/5ML IJ SOLN
INTRAMUSCULAR | Status: DC | PRN
  Administered 2024-05-28: 09:00:00 50 ug via INTRAVENOUS

## 2024-05-28 MED ORDER — FENTANYL CITRATE (PF) 100 MCG/2ML IJ SOLN
25.0000 ug | INTRAMUSCULAR | Status: DC | PRN
  Administered 2024-05-28 (×3): 50 ug via INTRAVENOUS

## 2024-05-28 MED ORDER — LIDOCAINE 2% (20 MG/ML) 5 ML SYRINGE
INTRAMUSCULAR | Status: DC | PRN
  Administered 2024-05-28: 09:00:00 100 mg via INTRAVENOUS

## 2024-05-28 MED ORDER — CHLORHEXIDINE GLUCONATE 0.12 % MT SOLN: 15.0000 mL | Freq: Once | OROMUCOSAL | Status: AC

## 2024-05-28 MED ORDER — VASHE WOUND IRRIGATION OPTIME
TOPICAL | Status: DC | PRN
  Administered 2024-05-28: 09:00:00 34 [oz_av]

## 2024-05-28 MED ORDER — ORAL CARE MOUTH RINSE: 15.0000 mL | Freq: Once | OROMUCOSAL | Status: AC

## 2024-05-28 MED ORDER — ACETAMINOPHEN 10 MG/ML IV SOLN
1000.0000 mg | Freq: Once | INTRAVENOUS | Status: DC | PRN
  Administered 2024-05-28: 10:00:00 1000 mg via INTRAVENOUS

## 2024-05-28 MED FILL — Fentanyl Citrate Preservative Free (PF) Inj 100 MCG/2ML: INTRAMUSCULAR | Qty: 2 | Status: AC

## 2024-05-28 MED FILL — Hydromorphone HCl Inj 1 MG/ML: INTRAMUSCULAR | Qty: 1 | Status: AC

## 2024-05-28 SURGICAL SUPPLY — 40 items
BAG COUNTER SPONGE SURGICOUNT (BAG) IMPLANT
BLADE SURG 21 STRL SS (BLADE) ×1 IMPLANT
BNDG COHESIVE 4X5 TAN STRL LF (GAUZE/BANDAGES/DRESSINGS) IMPLANT
BNDG COHESIVE 6X5 TAN NS LF (GAUZE/BANDAGES/DRESSINGS) IMPLANT
BNDG COHESIVE 6X5 TAN ST LF (GAUZE/BANDAGES/DRESSINGS) IMPLANT
BNDG GAUZE DERMACEA FLUFF 4 (GAUZE/BANDAGES/DRESSINGS) IMPLANT
CANISTER WOUNDNEG PRESSURE 500 (CANNISTER) IMPLANT
CLEANSER WND VASHE 34 (WOUND CARE) IMPLANT
CLEANSER WND VASHE INSTL 34OZ (WOUND CARE) IMPLANT
COVER SURGICAL LIGHT HANDLE (MISCELLANEOUS) ×2 IMPLANT
DRAPE DERMATAC (DRAPES) IMPLANT
DRAPE U-SHAPE 47X51 STRL (DRAPES) ×1 IMPLANT
DRESSING PREVENA PLUS CUSTOM (GAUZE/BANDAGES/DRESSINGS) IMPLANT
DRESSING VERAFLO CLEANS CC MED (GAUZE/BANDAGES/DRESSINGS) IMPLANT
DRSG ADAPTIC 3X8 NADH LF (GAUZE/BANDAGES/DRESSINGS) ×1 IMPLANT
DRSG VAC PEEL AND PLACE LRG (GAUZE/BANDAGES/DRESSINGS) IMPLANT
DURAPREP 26ML APPLICATOR (WOUND CARE) ×1 IMPLANT
ELECTRODE REM PT RTRN 9FT ADLT (ELECTROSURGICAL) IMPLANT
GAUZE PAD ABD 8X10 STRL (GAUZE/BANDAGES/DRESSINGS) IMPLANT
GAUZE SPONGE 4X4 12PLY STRL (GAUZE/BANDAGES/DRESSINGS) IMPLANT
GLOVE BIOGEL PI IND STRL 9 (GLOVE) ×1 IMPLANT
GLOVE BIOGEL PI ORTHO SZ9 (GLOVE) ×1 IMPLANT
GOWN STRL REUS W/ TWL XL LVL3 (GOWN DISPOSABLE) ×2 IMPLANT
GRAFT SKIN WND MICRO 38 (Tissue) IMPLANT
GRAFT SKIN WND OMEGA3 SB 7X10 (Tissue) IMPLANT
KIT BASIN OR (CUSTOM PROCEDURE TRAY) ×1 IMPLANT
KIT TURNOVER KIT B (KITS) ×1 IMPLANT
MANIFOLD NEPTUNE II (INSTRUMENTS) ×1 IMPLANT
PACK ORTHO EXTREMITY (CUSTOM PROCEDURE TRAY) ×1 IMPLANT
PAD ARMBOARD POSITIONER FOAM (MISCELLANEOUS) ×2 IMPLANT
PAD NEG PRESSURE SENSATRAC (MISCELLANEOUS) IMPLANT
SET HNDPC FAN SPRY TIP SCT (DISPOSABLE) IMPLANT
SOLN 0.9% NACL POUR BTL 1000ML (IV SOLUTION) ×1 IMPLANT
STOCKINETTE IMPERVIOUS 9X36 MD (GAUZE/BANDAGES/DRESSINGS) IMPLANT
SUT ETHILON 2 0 PSLX (SUTURE) ×1 IMPLANT
SWAB COLLECTION DEVICE MRSA (MISCELLANEOUS) ×1 IMPLANT
SWAB CULTURE ESWAB REG 1ML (MISCELLANEOUS) IMPLANT
TOWEL GREEN STERILE (TOWEL DISPOSABLE) ×1 IMPLANT
TUBE CONNECTING 12X1/4 (SUCTIONS) ×1 IMPLANT
YANKAUER SUCT BULB TIP NO VENT (SUCTIONS) ×1 IMPLANT

## 2024-05-28 NOTE — Anesthesia Preprocedure Evaluation (Addendum)
 Anesthesia Evaluation  Patient identified by MRN, date of birth, ID band  Reviewed: Allergy & Precautions, NPO status , Patient's Chart, lab work & pertinent test results  History of Anesthesia Complications Negative for: history of anesthetic complications  Airway Mallampati: II  TM Distance: >3 FB Neck ROM: Full    Dental  (+) Teeth Intact, Dental Advisory Given, Lower Dentures, Partial Upper   Pulmonary COPD, former smoker   breath sounds clear to auscultation       Cardiovascular hypertension, Pt. 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   Abdominal   Peds  Hematology  (+) Blood dyscrasia, anemia Hgb 11.1, Plts 495K (05/23/24)   Anesthesia Other Findings   Reproductive/Obstetrics                              Anesthesia Physical Anesthesia Plan  ASA: 3  Anesthesia Plan: General   Post-op Pain Management:    Induction: Intravenous  PONV Risk Score and Plan: 2  Airway Management Planned: LMA  Additional Equipment: None  Intra-op Plan:   Post-operative Plan: Extubation in OR  Informed Consent: I have reviewed the patients History and Physical, chart, labs and discussed the procedure including the risks, benefits and alternatives for the proposed anesthesia with the patient or authorized representative who has indicated his/her understanding and acceptance.     Dental advisory given  Plan Discussed with: CRNA  Anesthesia Plan Comments:           Anesthesia Quick Evaluation

## 2024-05-28 NOTE — Anesthesia Postprocedure Evaluation (Signed)
 Anesthesia Post Note  Patient: [name] Fernandez  Procedure(s) Performed: INCISION AND DRAINAGE OF LEFT ANKLE (Left: Ankle)     Patient location during evaluation: PACU Anesthesia Type: General Level of consciousness: awake Pain management: pain level controlled Vital Signs Assessment: post-procedure vital signs reviewed and stable Respiratory status: spontaneous breathing Cardiovascular status: blood pressure returned to baseline Postop Assessment: no apparent nausea or vomiting Anesthetic complications: no   No notable events documented.                Lauraine DASEN Colhoun

## 2024-05-28 NOTE — Op Note (Signed)
 05/28/2024  9:24 AM  PATIENT:  [name] Fernandez    PRE-OPERATIVE DIAGNOSIS:  Left Ankle Wound  POST-OPERATIVE DIAGNOSIS:  Same  PROCEDURE: Excisional debridement skin and soft tissue muscle fascia and tendon left ankle. Application of Kerecis micro graft 38 cm and a 7 x 10 cm sheet to cover a wound surface area of 170 cm. Application of tobramycin  powder 1.2 g mixed with the Kerecis micro graft. Application of cleanse choice wound VAC sponge and the large peel in place sponge  SURGEON:  Jerona LULLA Sage, MD  PHYSICIAN ASSISTANT:None ANESTHESIA:   General  PREOPERATIVE INDICATIONS:  [name] is a  83 y.o. male with a diagnosis of Left Ankle Wound who failed conservative measures and elected for surgical management.    The risks benefits and alternatives were discussed with the patient preoperatively including but not limited to the risks of infection, bleeding, nerve injury, cardiopulmonary complications, the need for revision surgery, among others, and the patient was willing to proceed.  OPERATIVE IMPLANTS:   Implant Name Type Inv. Item Serial No. Manufacturer Lot No. LRB No. Used Action  GRAFT SKIN WND MICRO 38 - [id number] Tissue GRAFT SKIN WND MICRO 38  [employer] [id number] Left 1 Implanted  GRAFT SKIN WND OMEGA3 SB 7X10 - [id number] Tissue GRAFT SKIN WND OMEGA3 SB 7X10  [employer] [id number] Left 1 Implanted    @ENCIMAGES @  OPERATIVE FINDINGS: Tissue was healthy viable with good petechial bleeding.  OPERATIVE PROCEDURE: Patient was brought the operating room underwent a general anesthetic.  After adequate levels anesthesia were obtained patient's left lower extremity was prepped using DuraPrep draped into a sterile field a timeout was called.  A 21 blade knife and a rondure were used to excise skin and soft tissue muscle fascia and tendon.  This was debrided back to bleeding viable tissue.  The wound was irrigated with Vashe.  The 38 cm of Kerecis micro graft was  mixed with 1.2 g of tobramycin  powder for the culture positive Pseudomonas.  This was applied to the wound bed that was 17 x 10 cm.  This was then covered with a 7 x 10 Kerecis sheet.  This was secured with a cleanse choice wound VAC sponge covered with a peel in place wound VAC sponge this had a good suction fit patient was extubated taken the PACU in stable condition.   DISCHARGE PLANNING:  Antibiotic duration: Continue meropenem   Weightbearing: Weightbearing as tolerated  Pain medication: Opioid pathway  Dressing care/ Wound VAC: Continue wound VAC for 1 week  Ambulatory devices: Walker  Discharge to: Anticipate discharge to home when patient set up for home antibiotic therapy  Follow-up: In the office 1 week post operative.

## 2024-05-28 NOTE — Progress Notes (Signed)
 This RN attempted to call report to CRNA without success. Telephone # [phone] states unavailable. Pt left to OR at approximately 0705. Report given to OR nurse.

## 2024-05-28 NOTE — Plan of Care (Signed)
   Problem: Health Behavior/Discharge Planning: Goal: Ability to manage health-related needs will improve Outcome: Progressing   Problem: Clinical Measurements: Goal: Ability to maintain clinical measurements within normal limits will improve Outcome: Progressing

## 2024-05-28 NOTE — Progress Notes (Signed)
 [facility]   ID was consulted yesterday and will review and see the patient today for further recommendations.    Maurilio Deland Collet PA-C

## 2024-05-28 NOTE — Transfer of Care (Signed)
 Immediate Anesthesia Transfer of Care Note  Patient: [name] Fernandez  Procedure(s) Performed: INCISION AND DRAINAGE OF LEFT ANKLE (Left: Ankle)  Patient Location: PACU  Anesthesia Type:General  Level of Consciousness: awake, alert , and oriented  Airway & Oxygen Therapy: Patient Spontanous Breathing  Post-op Assessment: Report given to RN and Post -op Vital signs reviewed and stable  Post vital signs: Reviewed and stable  Last Vitals:  Vitals Value Taken Time  BP 136/72 05/28/24 09:18  Temp 36.7 C 05/28/24 09:18  Pulse 79 05/28/24 09:29  Resp 16 05/28/24 09:29  SpO2 91 % 05/28/24 09:29  Vitals shown include unfiled device data.  Last Pain:  Vitals:   05/28/24 0926  TempSrc:   PainSc: 10-Worst pain ever      Patients Stated Pain Goal: 6 (05/28/24 9081)  Complications: No notable events documented.

## 2024-05-28 NOTE — Consult Note (Signed)
 [facility]    Date of Admission:  05/23/2024     Total days of antibiotics 6               Reason for Consult: Chronic ankle wound   Referring Provider: Dr. Harden  Primary Care Provider: Alvia Corean CROME, FNP   ASSESSMENT:  [name] Fernandez is an 83 y/o caucasian male with chronic left foot ulcer/wound s/p previous surgical debridement and IV antibiotic treatment for Pseudomonas, Enterococcus, and Fingoldia presenting with increased pain and wound size and is s/p debridement x 2 with initial surgical specimen growing multi-drug resistant Pseudomonas.   [name] Fernandez is s/p debridement today. Will continue to monitor cultures for any additional organisms and adjust antibiotics as indicated. Discussed that there are no oral options for this infection and will need IV treatment. Continue post-operative wound care per Orthopedics. Discussed the concern of evolving Pseudomonas resistant organism and if he were to need spinal hardware this could place him at risk for further infection should he become bacteremic. Encouraged to continue protein intake to optimize healing. Contact precautions for multi-drug resistant organism. Remaining medical and supportive care per Orthopedics.   PLAN:  Continue current dose of meropenem . Post-operative wound care per Orthopedics.  Monitor cultures for additional organisms. Contact precautions for multi-drug resistant Pseudomonas. Optimize protein intake for healing.  Remaining medical and supportive care per Orthopedics.    Principal Problem:   Infected wound Active Problems:   Pseudomonas aeruginosa infection    vitamin C   1,000 mg Oral q AM   docusate sodium   100 mg Oral BID   magnesium  oxide  200 mg Oral QHS   melatonin  10 mg Oral QHS   pantoprazole   40 mg Oral Daily   triamcinolone  cream  1 Application Topical Daily     HPI: [name] Fernandez is a 83 y.o. male with previous medical history of Celiac disease, COPD,  hypothyroidism, bladder cancer and chronic ulcer of the left ankle presenting with increasing swelling and worsening foot pain.   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 Antibiotics were stopped at the time.  During follow up with Orthopedics on 05/22/24 noted to have increasing wound and additional pain. Also noted to have an L5 compression fracture that was recommended to treat infection prior to any back intervention. Admitted on 05/23/24 for debridement with cultures growing multi drug resistant Pseudomonas aeruginosa (R-Ciprofloxacin , Ceftazidime, and intermediate to Imipenem). Back to the OR today for repeat debridement. Afebrile with no leukocytosis. Current antibiotic regimen is meropenem . No additional surgical intervention is planned at this time with continued wound vac for 7 days.    Review of Systems: Review of Systems  Constitutional:  Negative for chills, fever and weight loss.  Respiratory:  Negative for cough, shortness of breath and wheezing.   Cardiovascular:  Negative for chest pain and leg swelling.  Gastrointestinal:  Negative for abdominal pain, constipation, diarrhea, nausea and vomiting.  Skin:  Negative for rash.     Past Medical History:  Diagnosis Date   Anemia    Celiac disease    diagnoses 06/24/12   Chronic ulcer of ankle (HCC)    left   COPD (chronic obstructive pulmonary disease) (HCC)  Esophageal stricture    GERD (gastroesophageal reflux disease)    Hiatal hernia    Hypertension    patient states  no current HTN   Hypothyroidism    Pre-diabetes    Prediabetes    Shortness of breath    from oxycodone  at times   Vitamin B12 deficiency    Vitamin D   deficiency     Social History[1]  Family History  Problem Relation Age of Onset   Esophageal cancer Father    Cancer Father    Early death Mother     Allergies[2]  OBJECTIVE: Blood pressure (!) 125/95, pulse 81, temperature 98.2 F (36.8 C), temperature source Oral, resp. rate 17, height 5' 5 (1.651 m), weight 75.3 kg, SpO2 97%.  Physical Exam Constitutional:      General: He is not in acute distress.    Appearance: He is well-developed.  Cardiovascular:     Rate and Rhythm: Normal rate and regular rhythm.     Heart sounds: Normal heart sounds.  Pulmonary:     Effort: Pulmonary effort is normal.     Breath sounds: Normal breath sounds.  Skin:    General: Skin is warm and dry.  Neurological:     Mental Status: He is alert and oriented to person, place, and time.     Lab Results Lab Results  Component Value Date   WBC 8.4 05/23/2024   HGB 11.1 (L) 05/23/2024   HCT 32.8 (L) 05/23/2024   MCV 94.8 05/23/2024   PLT 495 (H) 05/23/2024    Lab Results  Component Value Date   CREATININE 1.04 05/23/2024   BUN 21 05/23/2024   NA 135 05/23/2024   K 4.5 05/23/2024   CL 102 05/23/2024   CO2 25 05/23/2024    Lab Results  Component Value Date   ALT 24 05/23/2024   AST 48 (H) 05/23/2024   ALKPHOS 99 05/23/2024   BILITOT 0.8 05/23/2024     Microbiology: Recent Results (from the past 240 hours)  Aerobic/Anaerobic Culture w Gram Stain (surgical/deep wound)     Status: None (Preliminary result)   Collection Time: 05/23/24  2:27 PM   Specimen: Wound  Result Value Ref Range Status   Specimen Description TISSUE  Final   Special Requests LEFT ANKLE DEBRIDEMENT  Final   Gram Stain NO WBC SEEN ABUNDANT GRAM NEGATIVE RODS   Final   Culture   Final    ABUNDANT PSEUDOMONAS AERUGINOSA HOLDING FOR POSSIBLE ANAEROBE CONTINUING TO HOLD Performed at [facility], [address] [place], [place] [address]    Report Status PENDING  Incomplete   Organism ID, Bacteria  PSEUDOMONAS AERUGINOSA  Final      Susceptibility   Pseudomonas aeruginosa - MIC*    MEROPENEM  2 SENSITIVE Sensitive     CIPROFLOXACIN  >=4 RESISTANT Resistant     IMIPENEM 4 INTERMEDIATE Intermediate     CEFTAZIDIME/AVIBACTAM 8 SENSITIVE Sensitive     CEFTOLOZANE/TAZOBACTAM 1 SENSITIVE Sensitive     TOBRAMYCIN  <=1 SENSITIVE Sensitive     CEFTAZIDIME 16 RESISTANT Resistant     * ABUNDANT PSEUDOMONAS AERUGINOSA  MRSA Next Gen by PCR, Nasal     Status: None   Collection Time: 05/27/24 10:44 AM   Specimen: Nasal Mucosa; Nasal Swab  Result Value Ref Range Status   MRSA by PCR Next Gen NOT DETECTED NOT DETECTED Final    Comment: (NOTE) The GeneXpert MRSA Assay (FDA approved for NASAL specimens only), is one component of a comprehensive MRSA  colonization surveillance program. It is not intended to diagnose MRSA infection nor to guide or monitor treatment for MRSA infections. Test performance is not FDA approved in patients less than 59 years old. Performed at [facility], [address], [place], [place] [address]      Cathlyn July, NP [facility]  05/28/2024  1:58 PM     [1]  Social History Tobacco Use   Smoking status: Former    Current packs/day: 0.00    Average packs/day: 1.2 packs/day for 31.7 years (37.6 ttl pk-yrs)    Types: Cigarettes    Start date: 06/27/1960    Quit date: 06/27/1980    Years since quitting: 43.9    Passive exposure: Past   Smokeless tobacco: Never  Vaping Use   Vaping status: Never Used  Substance Use Topics   Alcohol use: No   Drug use: No  [2]  Allergies Allergen Reactions   Feraheme  [Ferumoxytol ] Swelling and Other (See Comments)    Swelling of lips and tongue, could not talk 30 minutes after the infusion.   Latex Other (See Comments)    Latex butterfly bandages tore off the skin and caused terrible blisters   Sulfa  Antibiotics Hives    Severe itching, blisters   Wound  Dressing Adhesive Other (See Comments)    Latex butterfly bandages tore off the skin and caused terrible blisters   Levothyroxine  Swelling and Other (See Comments)    Lip swelling   Doxycycline  Nausea Only

## 2024-05-28 NOTE — Anesthesia Procedure Notes (Signed)
 Procedure Name: LMA Insertion Date/Time: 05/28/2024 8:44 AM  Performed by: Hedy Jarred, CRNAPre-anesthesia Checklist: Patient identified, Emergency Drugs available, Suction available and Patient being monitored Patient Re-evaluated:Patient Re-evaluated prior to induction Oxygen Delivery Method: Circle System Utilized Preoxygenation: Pre-oxygenation with 100% oxygen Induction Type: IV induction Ventilation: Mask ventilation without difficulty LMA: LMA inserted LMA Size: 4.0 Number of attempts: 1 Airway Equipment and Method: Bite block Placement Confirmation: positive ETCO2 Tube secured with: Tape Dental Injury: Teeth and Oropharynx as per pre-operative assessment

## 2024-05-28 NOTE — Progress Notes (Incomplete)
 Mero dose

## 2024-05-28 NOTE — Plan of Care (Signed)

## 2024-05-28 NOTE — Interval H&P Note (Signed)
 History and Physical Interval Note:  05/28/2024 7:01 AM  [name] Fernandez  has presented today for surgery, with the diagnosis of Left Ankle Wound.  The various methods of treatment have been discussed with the patient and family. After consideration of risks, benefits and other options for treatment, the patient has consented to  Procedures with comments: INCISION AND DRAINAGE OF DEEP ABSCESS, ANKLE (Left) - LEFT ANKLE DEBRIDEMENT as a surgical intervention.  The patient's history has been reviewed, patient examined, no change in status, stable for surgery.  I have reviewed the patient's chart and labs.  Questions were answered to the patient's satisfaction.     Hallie Ishida V Bali Lyn

## 2024-05-29 ENCOUNTER — Other Ambulatory Visit: Payer: Self-pay

## 2024-05-29 ENCOUNTER — Encounter (HOSPITAL_COMMUNITY): Payer: Self-pay | Admitting: Orthopedic Surgery

## 2024-05-29 DIAGNOSIS — B952 Enterococcus as the cause of diseases classified elsewhere: Secondary | ICD-10-CM | POA: Diagnosis not present

## 2024-05-29 DIAGNOSIS — B965 Pseudomonas (aeruginosa) (mallei) (pseudomallei) as the cause of diseases classified elsewhere: Secondary | ICD-10-CM | POA: Diagnosis not present

## 2024-05-29 DIAGNOSIS — L97529 Non-pressure chronic ulcer of other part of left foot with unspecified severity: Secondary | ICD-10-CM | POA: Diagnosis not present

## 2024-05-29 DIAGNOSIS — M86172 Other acute osteomyelitis, left ankle and foot: Secondary | ICD-10-CM | POA: Diagnosis not present

## 2024-05-29 MED ORDER — DOCUSATE SODIUM 100 MG PO CAPS
100.0000 mg | ORAL_CAPSULE | Freq: Two times a day (BID) | ORAL | Status: DC
  Filled 2024-05-29: qty 1

## 2024-05-29 MED ORDER — MEROPENEM IV (FOR PTA / DISCHARGE USE ONLY): 2.0000 g | Freq: Two times a day (BID) | INTRAVENOUS | 0 refills | Status: AC

## 2024-05-29 MED ORDER — SODIUM CHLORIDE 0.9 % IV SOLN
2.0000 g | Freq: Two times a day (BID) | INTRAVENOUS | Status: DC
  Administered 2024-05-29 – 2024-05-30 (×2): 2 g via INTRAVENOUS
  Filled 2024-05-29 (×3): qty 40

## 2024-05-29 MED ORDER — MAGNESIUM CITRATE PO SOLN
1.0000 | Freq: Once | ORAL | Status: DC
  Filled 2024-05-29: qty 296

## 2024-05-29 MED ORDER — OXYCODONE HCL 10 MG PO TABS: 10.0000 mg | ORAL_TABLET | Freq: Three times a day (TID) | ORAL | 0 refills | Status: AC | PRN

## 2024-05-29 NOTE — Progress Notes (Signed)
 Patient ID: [name] Fernandez, male   DOB: 1941/03/18, 83 y.o.   MRN: [id number] Patient is status post repeat debridement lateral left ankle wound.  There is 25 cc in the wound VAC canister.  Patient wishes to continue with limb salvage intervention.  Discussed that patient can discharge to home with a Prevena plus portable wound VAC pump and antibiotic coverage based on infectious disease recommendations.  There is tobramycin  powder and Kerecis tissue graft covering the wound.

## 2024-05-29 NOTE — Progress Notes (Signed)
 Occupational Therapy Treatment Patient Details Name: [name] Fernandez MRN: [id number] DOB: 1941/02/11 Today's Date: 05/29/2024   History of present illness 83 yo M adm on 05/23/24 for debridement of ulcer on L lateral ankle. Plan to return to OR on 12/17. Noted that patient has an acute L5 compression fracture that is currently being treated conservatively until L ankle wound has been addressed.PMH HTN celiac disease, HLD, chronic L ankle ulcer/ chronic venous insufficiency, DM2, GERD s/p Nissen's fundoplication, osteoporosis with multiple compression fx ( L1, L2, L3 and L4) , hx kyphoplasty T11-T12,   OT comments  Pt making good progress with functional goals. Pt sitting EOB upon arrival, smiling and pleasant reporting that he feels better. Session focused on ADL mobility and LB ADLs with pt  educated on LB ADL A/E for home use with video demo and handout provided. OT will continue to follow acutely to maximize level of function and safety      If plan is discharge home, recommend the following:  A little help with walking and/or transfers;A little help with bathing/dressing/bathroom;Assistance with cooking/housework;Assist for transportation;Help with stairs or ramp for entrance   Equipment Recommendations  BSC/3in1;Other (comment) (ADL A/E kit)    Recommendations for Other Services      Precautions / Restrictions Precautions Precautions: Fall Recall of Precautions/Restrictions: Intact Precaution/Restrictions Comments: no formal back precautions ordered, educated and followed for safety and comfort. Pt unable to recall precautions but aware that he has back precautions handouts Restrictions Weight Bearing Restrictions Per Provider Order: Yes LLE Weight Bearing Per Provider Order: Weight bearing as tolerated       Mobility Bed Mobility               General bed mobility comments: Pt recieved sitting EOB    Transfers Overall transfer level: Modified independent Equipment  used: Rolling walker (2 wheels), 1 person hand held assist Transfers: Sit to/from Stand Sit to Stand: Supervision                 Balance Overall balance assessment: Mild deficits observed, not formally tested Sitting-balance support: No upper extremity supported Sitting balance-Leahy Scale: Good     Standing balance support: Bilateral upper extremity supported, During functional activity Standing balance-Leahy Scale: Fair                             ADL either performed or assessed with clinical judgement   ADL Overall ADL's : Needs assistance/impaired     Grooming: Wash/dry hands;Wash/dry face;Brushing hair;Supervision/safety;Standing   Upper Body Bathing: Set up;Sitting   Lower Body Bathing: Minimal assistance;Contact guard assist;Cueing for back precautions   Upper Body Dressing : Set up;Sitting   Lower Body Dressing: Minimal assistance;Contact guard assist;Cueing for back precautions;Sit to/from stand   Toilet Transfer: Supervision/safety;Regular Toilet;Rolling walker (2 wheels)   Toileting- Clothing Manipulation and Hygiene: Supervision/safety;Modified independent       Functional mobility during ADLs: Supervision/safety General ADL Comments: pt educated on LB ADL A/E for home use with video demo and handout provided    Extremity/Trunk Assessment Upper Extremity Assessment Upper Extremity Assessment: Generalized weakness   Lower Extremity Assessment Lower Extremity Assessment: Defer to PT evaluation   Cervical / Trunk Assessment Cervical / Trunk Assessment: Kyphotic    Vision Ability to See in Adequate Light: 0 Adequate Patient Visual Report: No change from baseline     Perception     Praxis     Communication Communication Communication: No apparent  difficulties   Cognition Arousal: Alert Behavior During Therapy: WFL for tasks assessed/performed Cognition: No apparent impairments                                Following commands: Intact        Cueing   Cueing Techniques: Verbal cues, Visual cues  Exercises      Shoulder Instructions       General Comments      Pertinent Vitals/ Pain       Pain Assessment Pain Assessment: Faces Faces Pain Scale: Hurts little more Pain Location: low back Pain Descriptors / Indicators: Discomfort, Grimacing, Guarding Pain Intervention(s): Monitored during session, Repositioned  Home Living                                          Prior Functioning/Environment              Frequency  Min 1X/week        Progress Toward Goals  OT Goals(current goals can now be found in the care plan section)  Progress towards OT goals: Progressing toward goals     Plan      Co-evaluation                 AM-PAC OT 6 Clicks Daily Activity     Outcome Measure   Help from another person eating meals?: None Help from another person taking care of personal grooming?: A Little Help from another person toileting, which includes using toliet, bedpan, or urinal?: A Little Help from another person bathing (including washing, rinsing, drying)?: A Little Help from another person to put on and taking off regular upper body clothing?: A Little Help from another person to put on and taking off regular lower body clothing?: A Little 6 Click Score: 19    End of Session Equipment Utilized During Treatment: Rolling walker (2 wheels);Gait belt  OT Visit Diagnosis: Unsteadiness on feet (R26.81);Muscle weakness (generalized) (M62.81);Pain Pain - part of body:  (back)   Activity Tolerance Patient tolerated treatment well   Patient Left in bed;with call bell/phone within reach;Other (comment) (sitting EOB)   Nurse Communication Mobility status        Time: 8841-8775 OT Time Calculation (min): 26 min  Charges: OT General Charges $OT Visit: 1 Visit OT Treatments $Self Care/Home Management : 8-22 mins $Therapeutic Activity: 8-22  mins    [name] Fernandez Loose 05/29/2024, 1:05 PM

## 2024-05-29 NOTE — Progress Notes (Signed)
 [facility]  Date of Admission:  05/23/2024     Reason for Follow Up: Infected wound  Total days of antibiotics 7         ASSESSMENT:  [name] Fernandez is an 83 y/o caucasian male with chronic left foot ulcer/wound s/p previous surgical debridement and IV antibiotic treatment for Pseudomonas, Enterococcus, and Fingoldia presenting with increased pain and wound size and is s/p debridement x 2 with initial surgical specimen growing multi-drug resistant Pseudomonas.   [name] Fernandez cultures are holding for anaerobe. Discussed plan of care for 4 weeks of meropenem  via PICC line. Continue post-operative care per Orthopedics. Place PICC line. Home Health/OPAT orders. Ok for discharge from ID standpoint once PICC in place and Home Health arranged. Follow up in ID clinic. Remaining medical and supportive care per Orthopedics.   PLAN:  Continue current dose of meropenem  through 06/25/24 Post-operative wound care per Orthopedics.  Place PICC. Home Health/OPAT orders Contact precautions for MDRO  [facility] for discharge from ID standpoint once PICC placed and Home Health arranged. Follow up in ID clinic. Remaining medical and supportive care per Internal Medicine.   Diagnosis:  MDR Pseudomonas wound infection    Allergies[1]  OPAT Orders Discharge antibiotics to be given via PICC line Discharge antibiotics: Meropenem  2 g IV every 12 hours  Per pharmacy protocol   Duration: 4 weeks  End Date: 06/25/24  [facility] Care Per Protocol:  Home health RN for IV administration and teaching; PICC line care and labs.    Labs weekly while on IV antibiotics: _X_ CBC with differential __ BMP _X_ CMP _X_ CRP _X_ ESR __ Vancomycin  trough __ CK  __ Please pull PIC at completion of IV antibiotics _X_ Please leave PIC in place until doctor has seen patient or been notified  Fax weekly labs to [phone]  Clinic Follow Up Appt:  06/27/23 at 10:30 am with Dr. Dennise    Principal Problem:   Infected wound Active Problems:   Pseudomonas aeruginosa infection    vitamin C   1,000 mg Oral q AM   docusate sodium   100 mg Oral BID   docusate sodium   100 mg Oral BID   magnesium  citrate  1 Bottle Oral Once   magnesium  oxide  200 mg Oral QHS   melatonin  10 mg Oral QHS   pantoprazole   40 mg Oral Daily   triamcinolone  cream  1 Application Topical Daily    SUBJECTIVE:  Afebrile overnight with no acute events. Tolerating antibiotics with no adverse side effects.   Allergies[2]   Review of Systems: Review of Systems  Constitutional:  Negative for chills, fever and weight loss.  Respiratory:  Negative for cough, shortness of breath and wheezing.   Cardiovascular:  Negative for chest pain and leg swelling.  Gastrointestinal:  Negative for abdominal pain, constipation, diarrhea, nausea and vomiting.  Skin:  Negative for rash.      OBJECTIVE: Vitals:   05/28/24 2030 05/28/24 2327 05/29/24 0320 05/29/24 0731  BP: 133/77 110/61 (!) 111/49 135/82  Pulse: 98 87 79 86  Resp:   18 16  Temp: 98.1 F (36.7 C) 97.6 F (36.4 C) 97.9 F (36.6 C) 98.2 F (36.8 C)  TempSrc:    Oral  SpO2: 95% 94% 96% 98%  Weight:      Height:       Body mass index is 27.62 kg/m.  Physical Exam Constitutional:      General: He is not in acute distress.  Appearance: He is well-developed.  Cardiovascular:     Rate and Rhythm: Normal rate and regular rhythm.     Heart sounds: Normal heart sounds.  Pulmonary:     Effort: Pulmonary effort is normal.     Breath sounds: Normal breath sounds.  Skin:    General: Skin is warm and dry.  Neurological:     Mental Status: He is alert and oriented to person, place, and time.     Lab Results Lab Results  Component Value Date   WBC 8.4 05/23/2024   HGB 11.1 (L) 05/23/2024   HCT 32.8 (L) 05/23/2024   MCV 94.8 05/23/2024   PLT 495 (H) 05/23/2024    Lab Results  Component Value Date   CREATININE 1.04 05/23/2024    BUN 21 05/23/2024   NA 135 05/23/2024   K 4.5 05/23/2024   CL 102 05/23/2024   CO2 25 05/23/2024    Lab Results  Component Value Date   ALT 24 05/23/2024   AST 48 (H) 05/23/2024   ALKPHOS 99 05/23/2024   BILITOT 0.8 05/23/2024     Microbiology: Recent Results (from the past 240 hours)  Aerobic/Anaerobic Culture w Gram Stain (surgical/deep wound)     Status: None (Preliminary result)   Collection Time: 05/23/24  2:27 PM   Specimen: Wound  Result Value Ref Range Status   Specimen Description TISSUE  Final   Special Requests LEFT ANKLE DEBRIDEMENT  Final   Gram Stain NO WBC SEEN ABUNDANT GRAM NEGATIVE RODS   Final   Culture   Final    ABUNDANT PSEUDOMONAS AERUGINOSA HOLDING FOR POSSIBLE ANAEROBE CONTINUING TO HOLD Performed at [facility], [address], [place], [place] [address]    Report Status PENDING  Incomplete   Organism ID, Bacteria PSEUDOMONAS AERUGINOSA  Final      Susceptibility   Pseudomonas aeruginosa - MIC*    MEROPENEM  2 SENSITIVE Sensitive     CIPROFLOXACIN  >=4 RESISTANT Resistant     IMIPENEM 4 INTERMEDIATE Intermediate     CEFTAZIDIME/AVIBACTAM 8 SENSITIVE Sensitive     CEFTOLOZANE/TAZOBACTAM 1 SENSITIVE Sensitive     TOBRAMYCIN  <=1 SENSITIVE Sensitive     CEFTAZIDIME 16 RESISTANT Resistant     * ABUNDANT PSEUDOMONAS AERUGINOSA  MRSA Next Gen by PCR, Nasal     Status: None   Collection Time: 05/27/24 10:44 AM   Specimen: Nasal Mucosa; Nasal Swab  Result Value Ref Range Status   MRSA by PCR Next Gen NOT DETECTED NOT DETECTED Final    Comment: (NOTE) The GeneXpert MRSA Assay (FDA approved for NASAL specimens only), is one component of a comprehensive MRSA colonization surveillance program. It is not intended to diagnose MRSA infection nor to guide or monitor treatment for MRSA infections. Test performance is not FDA approved in patients less than 36 years old. Performed at [facility], [address], [place], [place] [address]       Cathlyn July, NP [facility]  05/29/2024  10:29 AM     [1]  Allergies Allergen Reactions   Feraheme  [Ferumoxytol ] Swelling and Other (See Comments)    Swelling of lips and tongue, could not talk 30 minutes after the infusion.   Latex Other (See Comments)    Latex butterfly bandages tore off the skin and caused terrible blisters   Sulfa  Antibiotics Hives    Severe itching, blisters   Wound Dressing Adhesive Other (See Comments)    Latex butterfly  bandages tore off the skin and caused terrible blisters   Levothyroxine  Swelling and Other (See Comments)    Lip swelling   Doxycycline  Nausea Only  [2]  Allergies Allergen Reactions   Feraheme  [Ferumoxytol ] Swelling and Other (See Comments)    Swelling of lips and tongue, could not talk 30 minutes after the infusion.   Latex Other (See Comments)    Latex butterfly bandages tore off the skin and caused terrible blisters   Sulfa  Antibiotics Hives    Severe itching, blisters   Wound Dressing Adhesive Other (See Comments)    Latex butterfly bandages tore off the skin and caused terrible blisters   Levothyroxine  Swelling and Other (See Comments)    Lip swelling   Doxycycline  Nausea Only

## 2024-05-29 NOTE — TOC Initial Note (Addendum)
 Transition of Care [facility]) - Initial/Assessment Note    Patient Details  Name: [name] Fernandez MRN: [id number] Date of Birth: July 10, 1940  [facility] CM/SW Contact:    Rosalva Jon Bloch, RN Phone Number: 05/29/2024, 12:21 PM  Clinical Narrative:                    - ulcer on L lateral ankle, s/p excisional debridement 12/17  From home with spouse. PTA independent with ADL's. Pt without DME needs. Per ID pt will require LT IV ABX therapy for 4 weeks. Pt agreeable to home health services. Pt without provider preference. Referral made with [facility] and accepted [facility] services. Referral made with [facility] for IV ABX therapy and accepted. PICC line pending...home infusion teaching pending IP CM team following and will assist with needs.  Expected Discharge Plan: Home w Home Health Services Barriers to Discharge: Continued Medical Work up   Patient Goals and CMS Choice     Choice offered to / list presented to : Patient      Expected Discharge Plan and Services   Discharge Planning Services: CM Consult   Living arrangements for the past 2 months: Single Family Home                 DME Arranged: Other see comment (iv abx therapy)   Date DME Agency Contacted: 05/29/24 Time DME Agency Contacted: 1219 Representative spoke with at DME Agency: [place] / [facility] HH arranged: RN ( refused PT)          Prior Living Arrangements/Services Living arrangements for the past 2 months: Single Family Home Lives with:: Spouse Patient language and need for interpreter reviewed:: Yes Do you feel safe going back to the place where you live?: Yes      Need for Family Participation in Patient Care: Yes (Comment) Care giver support system in place?: Yes (comment)   Criminal Activity/Legal Involvement Pertinent to Current Situation/Hospitalization: No - Comment as needed  Activities of Daily Living   ADL Screening (condition at time of  admission) Independently performs ADLs?: No Does the patient have a NEW difficulty with bathing/dressing/toileting/self-feeding that is expected to last >3 days?: Yes (Initiates electronic notice to provider for possible OT consult) Does the patient have a NEW difficulty with getting in/out of bed, walking, or climbing stairs that is expected to last >3 days?: Yes (Initiates electronic notice to provider for possible PT consult) Does the patient have a NEW difficulty with communication that is expected to last >3 days?: No Is the patient deaf or have difficulty hearing?: No Does the patient have difficulty seeing, even when wearing glasses/contacts?: No Does the patient have difficulty concentrating, remembering, or making decisions?: No  Permission Sought/Granted                  Emotional Assessment       Orientation: : Oriented to Self, Oriented to Place, Oriented to  Time, Oriented to Situation Alcohol / Substance Use: Not Applicable Psych Involvement: No (comment)  Admission diagnosis:  Ankle wound, left, initial encounter [S91.002A] Infected wound [T14.8XXA, L08.9] Patient Active Problem List   Diagnosis Date Noted   Pseudomonas aeruginosa infection 05/28/2024   Infected wound 05/23/2024   Immunization due 03/17/2024   Pruritus 03/11/2024   Syncope 09/22/2023   Unintentional weight loss 09/22/2023   Bladder mass 09/22/2023   Vertigo 09/21/2023   Rash and nonspecific skin eruption 02/08/2023   Ankle wound, left,  initial encounter 01/12/2023   Ankle ulcer, left, with necrosis of muscle (HCC) 11/10/2022   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) 06/16/2022   Osteomyelitis (HCC) 06/16/2022   Tenosynovitis of tibialis posterior tendon 06/15/2022   Wound infection 06/15/2022   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   Anemia 03/13/2019   Chronic venous insufficiency 03/12/2019   CKD (chronic kidney disease) stage 2, 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/2015    COPD (chronic obstructive pulmonary disease) (HCC) 12/08/2015   Chronic pain syndrome 05/26/2015   DDD (degenerative disc disease), lumbar 05/26/2015   Medication management 05/26/2015   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   Lumbago with sciatica 08/06/2014   Hyperlipidemia 08/20/2013   Essential hypertension    Abnormal glucose    Vitamin D  deficiency    Arthritis    Vitamin B12 deficiency    Celiac disease 06/19/2012   GERD (gastroesophageal reflux disease) 04/30/2012   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 tube) procedure 04/30/2012   Hypothyroidism 04/30/2012   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 AT [address] OF [facility] [address] [place] [address] Phone: [phone] Fax: [phone]  [id number] [place], [place] - [address] AT [facility] [address] [facility] [address] [facility] [address] [place] [address] Phone: [phone] Fax: [phone]     Social Drivers of Health (SDOH) Social History: SDOH Screenings   Food Insecurity: No Food Insecurity (05/23/2024)  Housing: Low Risk (05/23/2024)  Transportation Needs: No Transportation Needs (05/23/2024)  Utilities: Not At Risk (05/23/2024)  Depression (PHQ2-9): Low Risk (02/08/2023)  Financial Resource Strain: Low Risk (03/07/2024)  Physical Activity: Inactive (03/07/2024)  Social Connections: Moderately Isolated (05/23/2024)  Stress: Stress Concern Present (03/07/2024)  Tobacco Use: Medium Risk (05/23/2024)   SDOH Interventions:     Readmission Risk Interventions    06/20/2022   11:21 AM 06/19/2022    2:50 PM  Readmission Risk Prevention Plan  Transportation Screening Complete Complete  PCP or Specialist Appt within 5-7 Days Complete Complete  Home Care Screening Complete Complete  Medication Review (RN CM) Complete Complete

## 2024-05-29 NOTE — Progress Notes (Cosign Needed)
 Physical Therapy Treatment Patient Details Name: [name] Fernandez MRN: [id number] DOB: 05-25-1941 Today's Date: 05/29/2024   History of Present Illness 83 yo M adm on 05/23/24 for debridement of ulcer on L lateral ankle. Plan to return to OR on 12/17. Noted that patient has an acute L5 compression fracture that is currently being treated conservatively until L ankle wound has been addressed, Dr Harden does not want him wearing a back brace per secure chat 12/18. 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 Pt with limited receptiveness to instruction on safer RW management and to avoid excessive hip flexion with donning shoes/transfers. RN notified to reinforce precs with patient. Pt defers HHPT or OPPT upon DC, pt encouraged to follow-up with MD if he changes his mind after returning home. PTA discussed pt progress with supervising PT Dawn M, dispo updated below per pt wishes.   If plan is discharge home, recommend the following: A little help with bathing/dressing/bathroom;Assist for transportation;Help with stairs or ramp for entrance;Assistance with cooking/housework   Can travel by private vehicle        Equipment Recommendations  Rolling walker (2 wheels)    Recommendations for Other Services       Precautions / Restrictions Precautions Precautions: Fall;Other (comment) Recall of Precautions/Restrictions: Intact Precaution/Restrictions Comments: Contact precs; no formal back precautions ordered, educated and followed for safety and comfort. PTA brought back precs handout to his room and pt with poor carryover during session. Restrictions Weight Bearing Restrictions Per  Provider Order: Yes LLE Weight Bearing Per Provider Order: Weight bearing as tolerated Other Position/Activity Restrictions: pt reports pain increased when he stands within RW bounds, and holds RW outside BOS     Mobility  Bed Mobility Overal bed mobility: Modified Independent             General bed mobility comments: HOB elevated; cues for log roll, pt has handout also    Transfers Overall transfer level: Modified independent Equipment used: Rolling walker (2 wheels), 1 person hand held assist Transfers: Sit to/from Stand             General transfer comment: Cues to avoid excessive trunk flexion with sit to stand    Ambulation/Gait Ambulation/Gait assistance: Modified independent (Device/Increase time) Gait Distance (Feet): 200 Feet Assistive device: Rolling walker (2 wheels) Gait Pattern/deviations: Step-through pattern, Decreased step length - right, Decreased step length - left, Decreased weight shift to left       General Gait Details: Pt keeping RW outside BOS, cues for closer proximity to RW and adjusting grip on RW for comfort, pt defers, stating that makes my pain worse, PTA offered to lower RW one click for back comfort so he can stand more within RW, but he defers. Pt modI after initial instruction.   Stairs Stairs:  (no STE home, PTA verbally reviewed step sequencing if he goes up a curb)           Wheelchair Mobility     Tilt Bed    Modified Rankin (Stroke Patients Only)       Balance Overall balance assessment: Mild deficits observed, not formally tested Sitting-balance support: No upper extremity supported Sitting balance-Leahy Scale: Good  Standing balance support: Bilateral upper extremity supported, During functional activity Standing balance-Leahy Scale: Fair (no imbalance when pt abandons RW a few feet before return to bed)                              Communication Communication Communication: No apparent  difficulties  Cognition Arousal: Alert Behavior During Therapy: WFL for tasks assessed/performed   PT - Cognitive impairments: Other                       PT - Cognition Comments: self-directed, not receptive to suggestions on RW management and limited carryover of education on back precs; pt given handout on back precs for comfort. Spouse present in room and after pt educated on back precs/reasoning for this, she states he has a fracture in his spine, thus seemed to have missed conversation that PTA just had with him on precs, unclear if spouse HoH. Following commands: Intact      Cueing Cueing Techniques: Verbal cues, Visual cues  Exercises Other Exercises Other Exercises: Defer due to acute lumbar fx    General Comments General comments (skin integrity, edema, etc.): education on back precs for comfort/no BLT, handout brought to room, SpO2/HR [facility] on RA      Pertinent Vitals/Pain Pain Assessment Pain Assessment: Faces Faces Pain Scale: Hurts little more Pain Location: low back worse than foot pain Pain Descriptors / Indicators: Discomfort, Grimacing, Guarding Pain Intervention(s): Limited activity within patient's tolerance, Monitored during session, Repositioned    Home Living                          Prior Function            PT Goals (current goals can now be found in the care plan section) Acute Rehab PT Goals Patient Stated Goal: Per patient, to go home PT Goal Formulation: With patient Time For Goal Achievement: 06/07/24 Progress towards PT goals: Progressing toward goals    Frequency    Min 1X/week      PT Plan      Co-evaluation              AM-PAC PT 6 Clicks Mobility   Outcome Measure  Help needed turning from your back to your side while in a flat bed without using bedrails?: None Help needed moving from lying on your back to sitting on the side of a flat bed without using bedrails?: None Help needed moving to and  from a bed to a chair (including a wheelchair)?: None Help needed standing up from a chair using your arms (e.g., wheelchair or bedside chair)?: None Help needed to walk in hospital room?: None Help needed climbing 3-5 steps with a railing? : None 6 Click Score: 24    End of Session Equipment Utilized During Treatment: Other (comment) (pt defers gait belt; L post-op shoe donned) Activity Tolerance: Patient tolerated treatment well Patient left: in bed;with call bell/phone within reach;with family/visitor present (spouse in room, RN notified pt assist level and that bed alarm not set, pt agreeable to request help to don shoes before getting up) Nurse Communication: Mobility status;Precautions;Other (comment) (back precs for comfort, assist pt to don shoes before he gets up to bathroom) PT Visit Diagnosis: Unsteadiness on feet (R26.81);Other abnormalities of gait and mobility (R26.89)     Time: 8597-8587 PT Time Calculation (min) (ACUTE ONLY): 10 min  Charges:    $Therapeutic Activity: 8-22 mins PT General Charges $$ ACUTE PT VISIT: 1 Visit                     Esraa Seres P., PTA Acute Rehabilitation Services Secure Chat Preferred 9a-5:30pm Office: [phone]    Connell HERO [facility] 05/29/2024, 2:40 PM

## 2024-05-29 NOTE — Progress Notes (Signed)
 Patient ID: [name] Fernandez, male   DOB: Jun 06, 1941, 83 y.o.   MRN: [id number] Plan for discharge to home tomorrow with a PICC line and IV antibiotics.

## 2024-05-29 NOTE — Plan of Care (Signed)
 ?  Problem: Clinical Measurements: ?Goal: Ability to maintain clinical measurements within normal limits will improve ?Outcome: Progressing ?Goal: Will remain free from infection ?Outcome: Progressing ?Goal: Diagnostic test results will improve ?Outcome: Progressing ?  ?

## 2024-05-29 NOTE — Progress Notes (Signed)
 [facility]  ID recommendations: Placing PICC and 4 weeks of Meropenem . Will be good to go once PICC placed and Home Health established.  They have placed orders for [facility] RN and antibiotic orders.  Pending PICC line placement.  Maurilio Deland Collet PA-C

## 2024-05-29 NOTE — Progress Notes (Signed)
 Pharmacy: Antimicrobial Stewardship Note  45 YOM with Pseudomonas L-ankle wound with resistance noted as below:  Aerobic/Anaerobic Culture w Gram Stain (surgical/deep wound) [[id number]]  Collected: 05/23/24 1427  Order Status: Completed Specimen: Wound Updated: 05/29/24 1435   Specimen Description TISSUE   Special Requests LEFT ANKLE DEBRIDEMENT   Gram Stain --   NO WBC SEEN ABUNDANT GRAM NEGATIVE RODS   Culture --   ABUNDANT PSEUDOMONAS AERUGINOSA NO ANAEROBES ISOLATED FEW ENTEROCOCCUS FAECALIS SUSCEPTIBILITIES TO FOLLOW Performed at [facility], [address], [place], [place] [address]   Report Status PENDING   Organism ID, Bacteria PSEUDOMONAS AERUGINOSA  Susceptibility  Pseudomonas aeruginosa ([id number])  Antibiotic Interpretation Microscan Method Status   MEROPENEM  Sensitive 2 SENSITIVE MIC Final   CIPROFLOXACIN  Resistant >=4 RESISTANT MIC Final   IMIPENEM Intermediate 4 INTERMEDIATE MIC Final   CEFTAZIDIME/AVIBACTAM Sensitive 8 SENSITIVE MIC Final   CEFTOLOZANE/TAZOBACTAM Sensitive 1 SENSITIVE MIC Final   TOBRAMYCIN  Sensitive <=1 SENSITIVE MIC Final   CEFTAZIDIME Resistant 16 RESISTANT MIC Final        Meropenem  MIC right at the CLSI sensitive breakpoint of 2. The patient is noted to be 83 YO with fluctuating renal function 1-1.35, will adjust Meropenem  dose to 2g/12h to be more aggressive for his pathogen/MIC breakpoint keeping in mind his age/renal function - with bid dosing easier for home administration as well.   Thank you for allowing pharmacy to be a part of this patients care.  Almarie Lunger, PharmD, BCPS, BCIDP Infectious Diseases Clinical Pharmacist 05/29/2024 2:38 PM   **Pharmacist phone directory can now be found on [email or web address] ([email or web address]).  Listed under [facility].

## 2024-05-29 NOTE — Progress Notes (Addendum)
 PHARMACY CONSULT NOTE FOR:  OUTPATIENT  PARENTERAL ANTIBIOTIC THERAPY (OPAT)  Indication: Pseudomonas L-ankle wound Regimen: Meropenem  2g IV every 12 hours End date: 06/25/24 (4 weeks from OR 12/17)  IV antibiotic discharge orders are pended. To discharging provider:  please sign these orders via discharge navigator,  Select New Orders & click on the button choice - Manage This Unsigned Work.     Thank you for allowing pharmacy to be a part of this patients care.  Almarie Lunger, PharmD, BCPS, BCIDP Infectious Diseases Clinical Pharmacist 05/29/2024 2:29 PM   **Pharmacist phone directory can now be found on [email or web address] ([email or web address]).  Listed under [facility].

## 2024-05-30 ENCOUNTER — Encounter: Payer: Self-pay | Admitting: Internal Medicine

## 2024-05-30 ENCOUNTER — Other Ambulatory Visit (HOSPITAL_COMMUNITY): Payer: Self-pay

## 2024-05-30 LAB — AEROBIC/ANAEROBIC CULTURE W GRAM STAIN (SURGICAL/DEEP WOUND): Gram Stain: NONE SEEN

## 2024-05-30 LAB — BASIC METABOLIC PANEL WITH GFR
Anion gap: 7 (ref 5–15)
BUN: 16 mg/dL (ref 8–23)
CO2: 27 mmol/L (ref 22–32)
Calcium: 8.9 mg/dL (ref 8.9–10.3)
Chloride: 101 mmol/L (ref 98–111)
Creatinine, Ser: 0.8 mg/dL (ref 0.61–1.24)
GFR, Estimated: >60 mL/min
Glucose, Bld: 90 mg/dL (ref 70–99)
Potassium: 4.5 mmol/L (ref 3.5–5.1)
Sodium: 135 mmol/L (ref 135–145)

## 2024-05-30 MED ORDER — HEPARIN SOD (PORK) LOCK FLUSH 100 UNIT/ML IV SOLN: 250.0000 [IU] | INTRAVENOUS | Status: DC | PRN

## 2024-05-30 MED ORDER — AMOXICILLIN 500 MG PO CAPS
1000.0000 mg | ORAL_CAPSULE | Freq: Three times a day (TID) | ORAL | 0 refills | Status: AC
  Filled 2024-05-30: qty 133, 22d supply, fill #0
  Filled 2024-06-17: qty 23, 4d supply, fill #0

## 2024-05-30 MED ORDER — SODIUM CHLORIDE 0.9% FLUSH: 10.0000 mL | INTRAVENOUS | Status: DC | PRN

## 2024-05-30 MED ORDER — AMOXICILLIN 500 MG PO CAPS
1000.0000 mg | ORAL_CAPSULE | Freq: Three times a day (TID) | ORAL | Status: DC
  Administered 2024-05-30: 1000 mg via ORAL
  Filled 2024-05-30 (×3): qty 2

## 2024-05-30 MED ORDER — CHLORHEXIDINE GLUCONATE CLOTH 2 % EX PADS
6.0000 | MEDICATED_PAD | Freq: Every day | CUTANEOUS | Status: DC
  Administered 2024-05-30: 6 via TOPICAL

## 2024-05-30 MED ORDER — SODIUM CHLORIDE 0.9% FLUSH
10.0000 mL | Freq: Two times a day (BID) | INTRAVENOUS | Status: DC
  Administered 2024-05-30: 10 mL

## 2024-05-30 NOTE — Progress Notes (Signed)
 Pharmacy note - antibiotic stewardship  Culture finalized with an additional organism - enterococcus faecalis.  Discussed with Dr. Dennise and Dr. Harden.  Will add amoxicillin  1g po TID.  Rx sent to [facility] pharmacy.  Discussed this with the patient and his wife [name] Fernandez.    Venetia Gully, PharmD, [name] Fernandez, [facility] Clinical Pharmacist Phone [phone]

## 2024-05-30 NOTE — Progress Notes (Signed)
 Physical Therapy Discharge Patient Details Name: [name] Fernandez MRN: [id number] DOB: [birth year] Today's Date: 05/30/2024 Time:  -     Patient discharged from PT services secondary to goals met and no further PT needs identified.  Please see latest therapy progress note for current level of functioning and progress toward goals.    Progress and discharge plan discussed with patient and/or caregiver: Patient/Caregiver agrees with plan  GP     [name] 05/30/2024, 7:51 AM  [name] M,PT Acute Rehab Services [phone]

## 2024-05-30 NOTE — Discharge Summary (Signed)
 " Physician Discharge Summary  Patient ID: [name] Fernandez MRN: [id number] DOB/AGE: 83/01/1941 83 y.o.  Admit date: 05/23/2024 Discharge date: 05/30/2024  Admission Diagnoses:  Principal Problem:   Infected wound Active Problems:   Pseudomonas aeruginosa infection   Discharge Diagnoses:  Same  Past Medical History:  Diagnosis Date   Anemia    Celiac disease    diagnoses 06/24/12   Chronic ulcer of ankle (HCC)    left   COPD (chronic obstructive pulmonary disease) (HCC)    Esophageal stricture    GERD (gastroesophageal reflux disease)    Hiatal hernia    Hypertension    patient states  no current HTN   Hypothyroidism    Pre-diabetes    Prediabetes    Shortness of breath    from oxycodone  at times   Vitamin B12 deficiency    Vitamin D  deficiency     Surgeries: Procedures: INCISION AND DRAINAGE OF LEFT ANKLE on 05/28/2024   Consultants:   Discharged Condition: Improved  Hospital Course: [name] Fernandez is an 83 y.o. male who was admitted 05/23/2024 with a chief complaint of No chief complaint on file. , and found to have a diagnosis of Infected wound.  They were brought to the operating room on 05/28/2024 and underwent the above named procedures.    They were given perioperative antibiotics:  Anti-infectives (From admission, onward)    Start     Dose/Rate Route Frequency Ordered Stop   05/30/24 1015  amoxicillin (AMOXIL) capsule 1,000 mg        1,000 mg Oral Every 8 hours 05/30/24 0925     05/30/24 0000  amoxicillin (AMOXIL) 500 MG capsule        1,000 mg Oral 3 times daily 05/30/24 0924 06/25/24 2359   05/29/24 2200  meropenem  (MERREM ) 2 g in sodium chloride  0.9 % 100 mL IVPB        2 g 280 mL/hr over 30 Minutes Intravenous Every 12 hours 05/29/24 1432     05/29/24 0000  meropenem  (MERREM ) IVPB        2 g Intravenous Every 12 hours 05/29/24 1619 06/25/24 2359   05/28/24 1600  meropenem  (MERREM ) 2 g in sodium chloride  0.9 % 100 mL IVPB  Status:  Discontinued         2 g 280 mL/hr over 30 Minutes Intravenous Every 8 hours 05/28/24 1304 05/29/24 1432   05/28/24 0856  tobramycin  (NEBCIN ) powder  Status:  Discontinued          As needed 05/28/24 0856 05/28/24 0919   05/26/24 1600  meropenem  (MERREM ) 1 g in sodium chloride  0.9 % 100 mL IVPB  Status:  Discontinued        1 g 200 mL/hr over 30 Minutes Intravenous Every 8 hours 05/26/24 1518 05/28/24 1304   05/23/24 1930  vancomycin  (VANCOCIN ) IVPB 1000 mg/200 mL premix        1,000 mg 200 mL/hr over 60 Minutes Intravenous Every 12 hours 05/23/24 1840 05/23/24 2331   05/23/24 1930  piperacillin -tazobactam (ZOSYN ) IVPB 3.375 g  Status:  Discontinued        3.375 g 12.5 mL/hr over 240 Minutes Intravenous Every 8 hours 05/23/24 1844 05/26/24 1518   05/23/24 1145  ceFAZolin  (ANCEF ) IVPB 2g/100 mL premix        2 g 200 mL/hr over 30 Minutes Intravenous On call to O.R. 05/23/24 1137 05/23/24 1440     .  They were given compression stockings, early ambulation, and chemoprophylaxis  for DVT prophylaxis.  They benefited maximally from their hospital stay and there were no complications.    Recent vital signs:  Vitals:   05/30/24 0340 05/30/24 0754  BP: 131/72 (!) 145/122  Pulse: 82 83  Resp: 17 16  Temp: 98.4 F (36.9 C) 98.5 F (36.9 C)  SpO2: 94% 95%    Recent laboratory studies:  Results for orders placed or performed during the hospital encounter of 05/23/24  CBC WITH DIFFERENTIAL   Collection Time: 05/23/24 12:12 PM  Result Value Ref Range   WBC 8.4 4.0 - 10.5 K/uL   RBC 3.46 (L) 4.22 - 5.81 MIL/uL   Hemoglobin 11.1 (L) 13.0 - 17.0 g/dL   HCT 67.1 (L) 60.9 - 47.9 %   MCV 94.8 80.0 - 100.0 fL   MCH 32.1 26.0 - 34.0 pg   MCHC 33.8 30.0 - 36.0 g/dL   RDW 83.8 (H) 88.4 - 84.4 %   Platelets 495 (H) 150 - 400 K/uL   nRBC 0.0 0.0 - 0.2 %   Neutrophils Relative % 68 %   Neutro Abs 5.8 1.7 - 7.7 K/uL   Lymphocytes Relative 16 %   Lymphs Abs 1.4 0.7 - 4.0 K/uL   Monocytes Relative 12 %    Monocytes Absolute 1.0 0.1 - 1.0 K/uL   Eosinophils Relative 3 %   Eosinophils Absolute 0.2 0.0 - 0.5 K/uL   Basophils Relative 1 %   Basophils Absolute 0.1 0.0 - 0.1 K/uL   Immature Granulocytes 0 %   Abs Immature Granulocytes 0.02 0.00 - 0.07 K/uL  Comprehensive metabolic panel   Collection Time: 05/23/24 12:12 PM  Result Value Ref Range   Sodium 135 135 - 145 mmol/L   Potassium 4.5 3.5 - 5.1 mmol/L   Chloride 102 98 - 111 mmol/L   CO2 25 22 - 32 mmol/L   Glucose, Bld 87 70 - 99 mg/dL   BUN 21 8 - 23 mg/dL   Creatinine, Ser 8.95 0.61 - 1.24 mg/dL   Calcium  8.6 (L) 8.9 - 10.3 mg/dL   Total Protein 7.7 6.5 - 8.1 g/dL   Albumin 2.9 (L) 3.5 - 5.0 g/dL   AST 48 (H) 15 - 41 U/L   ALT 24 0 - 44 U/L   Alkaline Phosphatase 99 38 - 126 U/L   Total Bilirubin 0.8 0.0 - 1.2 mg/dL   GFR, Estimated >39 >39 mL/min   Anion gap 8 5 - 15  Aerobic/Anaerobic Culture w Gram Stain (surgical/deep wound)   Collection Time: 05/23/24  2:27 PM   Specimen: Wound  Result Value Ref Range   Specimen Description TISSUE    Special Requests LEFT ANKLE DEBRIDEMENT    Gram Stain      NO WBC SEEN ABUNDANT GRAM NEGATIVE RODS Performed at [facility], [address], [place], [place] [address]    Culture      ABUNDANT PSEUDOMONAS AERUGINOSA NO ANAEROBES ISOLATED FEW ENTEROCOCCUS FAECALIS    Report Status 05/30/2024 FINAL    Organism ID, Bacteria PSEUDOMONAS AERUGINOSA    Organism ID, Bacteria ENTEROCOCCUS FAECALIS       Susceptibility   Enterococcus faecalis - MIC*    AMPICILLIN <=2 SENSITIVE Sensitive     VANCOMYCIN  1 SENSITIVE Sensitive     GENTAMICIN SYNERGY SENSITIVE Sensitive     * FEW ENTEROCOCCUS FAECALIS   Pseudomonas aeruginosa - MIC*    MEROPENEM  2 SENSITIVE Sensitive     CIPROFLOXACIN  >=4 RESISTANT Resistant     IMIPENEM  4 INTERMEDIATE Intermediate     CEFTAZIDIME/AVIBACTAM 8 SENSITIVE Sensitive     CEFTOLOZANE/TAZOBACTAM 1 SENSITIVE Sensitive     TOBRAMYCIN  <=1 SENSITIVE  Sensitive     CEFTAZIDIME 16 RESISTANT Resistant     * ABUNDANT PSEUDOMONAS AERUGINOSA  Glucose, capillary   Collection Time: 05/24/24  5:03 AM  Result Value Ref Range   Glucose-Capillary 111 (H) 70 - 99 mg/dL  MRSA Next Gen by PCR, Nasal   Collection Time: 05/27/24 10:44 AM   Specimen: Nasal Mucosa; Nasal Swab  Result Value Ref Range   MRSA by PCR Next Gen NOT DETECTED NOT DETECTED  Basic metabolic panel with GFR   Collection Time: 05/30/24  3:47 AM  Result Value Ref Range   Sodium 135 135 - 145 mmol/L   Potassium 4.5 3.5 - 5.1 mmol/L   Chloride 101 98 - 111 mmol/L   CO2 27 22 - 32 mmol/L   Glucose, Bld 90 70 - 99 mg/dL   BUN 16 8 - 23 mg/dL   Creatinine, Ser 9.19 0.61 - 1.24 mg/dL   Calcium  8.9 8.9 - 10.3 mg/dL   GFR, Estimated >39 >39 mL/min   Anion gap 7 5 - 15    Discharge Medications:   Allergies as of 05/30/2024       Reactions   Feraheme  [ferumoxytol ] Swelling, Other (See Comments)   Swelling of lips and tongue, could not talk 30 minutes after the infusion.   Latex Other (See Comments)   Latex butterfly bandages tore off the skin and caused terrible blisters   Sulfa  Antibiotics Hives   Severe itching, blisters   Wound Dressing Adhesive Other (See Comments)   Latex butterfly bandages tore off the skin and caused terrible blisters   Levothyroxine  Swelling, Other (See Comments)   Lip swelling   Doxycycline  Nausea Only        Medication List     TAKE these medications    amoxicillin 500 MG capsule Commonly known as: AMOXIL Take 2 capsules (1,000 mg total) by mouth 3 (three) times daily for 26 days.   betamethasone  dipropionate 0.05 % cream Apply 1 Application topically 2 (two) times daily as needed (skin rash/irritation.).   diphenhydrAMINE  25 MG tablet Commonly known as: BENADRYL  Take 25 mg by mouth in the morning and at bedtime.   doxepin  10 MG capsule Commonly known as: SINEQUAN  Take 10-20 mg by mouth at bedtime as needed (itching).    hydrOXYzine  25 MG tablet Commonly known as: ATARAX  TAKE 1 TABLET 3 x / day  AS NEEDED FOR ANXIETY OR SLEEP                                                             /                                                                   TAKE  BY                                                 MOUTH   MAGNESIUM  PO Take 2 capsules by mouth at bedtime.   MELATONIN PO Take 10 mg by mouth at bedtime.   meropenem  IVPB Commonly known as: MERREM  Inject 2 g into the vein every 12 (twelve) hours for 27 days. Indication:  Psuedomonas ankle wound First Dose: Yes Last Day of Therapy:  06/25/24 Labs - Once weekly:  CBC/D and BMP, Labs - Once weekly: ESR and CRP Method of administration: Mini-Bag Plus / Gravity Method of administration may be changed at the discretion of home infusion pharmacist based upon assessment of the patient and/or caregiver's ability to self-administer the medication ordered.   omeprazole  20 MG tablet Commonly known as: PRILOSEC  OTC Take 20 mg by mouth daily before breakfast.   Oxycodone  HCl 10 MG Tabs Take 1 tablet (10 mg total) by mouth 3 (three) times daily as needed. What changed:  how much to take when to take this reasons to take this   torsemide  20 MG tablet Commonly known as: DEMADEX  Take 2 tablets (40 mg total) by mouth daily as needed (For worsening swelling or more than 2 pound weight gain from your baseline.). Take 2 tablet  Daily for Fluid Retention / Leg Swelling What changed: how much to take   triamcinolone  cream 0.1 % Commonly known as: KENALOG  APPLY TO THE LEFT LEG UNDER THE WRAP DAILY WHERE THERE IS ITCHING OR RASH X 2 WEEKS OR UNTIL HEALED What changed: See the new instructions.   VASHE WOUND EX Apply 1 Application topically daily. ANKLE WOUND   vitamin C  1000 MG tablet Take 1,000 mg by mouth in the morning.               Discharge Care Instructions  (From admission, onward)            Start     Ordered   05/29/24 0000  Change dressing on IV access line weekly and PRN  (Home infusion instructions - [facility] )        05/29/24 1619            Diagnostic Studies: US  EKG SITE RITE Result Date: 05/29/2024 If Site Rite image not attached, placement could not be confirmed due to current cardiac rhythm.  CT Lumbar Spine Wo Contrast Result Date: 05/05/2024 EXAM: CT OF THE LUMBAR SPINE WITHOUT CONTRAST 05/05/2024 06:33:08 PM TECHNIQUE: CT of the lumbar spine was performed without the administration of intravenous contrast. Multiplanar reformatted images are provided for review. Automated exposure control, iterative reconstruction, and/or weight based adjustment of the mA/kV was utilized to reduce the radiation dose to as low as reasonably achievable. COMPARISON: Radiographs 05/05/2024 and CT abdomen 09/21/2023. CLINICAL HISTORY: Low back pain, history of previous hardware. FINDINGS: BONES AND ALIGNMENT: Interval 65 percent compression fracture at L5 primarily along the superior endplate with 5 mm posterior bony retropulsion indicating middle column involvement, mild sclerosis in the vertebral body, and minimal residual superior endplate irregularity compatible with subacute chronicity. Remote endplate compression fractures at L2, L3, and L4. Normal vertebral body heights for other visualized levels. Normal alignment. No suspicious bone lesion. DEGENERATIVE CHANGES: Borderline central stenosis at L4-L5 due to disc bulge and posterior bony retropulsion. Remote endplate compression fractures at L2,  L3, and L4. The L5 compression fracture is also a degenerative change. SOFT TISSUES: Systemic atherosclerosis is present, including the aorta and iliac arteries. No acute abnormality. IMPRESSION: 1. Subacute 65% compression fracture at L5 with 5 mm posterior bony retropulsion and mild sclerosis. 2. Borderline central stenosis at L4-L5 due to disc bulge and posterior bony retropulsion.  3. Remote endplate compression fractures at L2, L3, and L4. 4. Systemic atherosclerosis involving the aorta and iliac arteries. Electronically signed by: Ryan Salvage MD 05/05/2024 06:47 PM EST RP Workstation: [id number]   DG Lumbar Spine Complete Result Date: 05/05/2024 EXAM: 4 VIEW(S) XRAY OF THE LUMBAR SPINE 05/05/2024 05:06:17 PM COMPARISON: MRI 01/24/2013. CT abdomen and pelvis for 11/25. CLINICAL HISTORY: back pain FINDINGS: LUMBAR SPINE: BONES: Interval moderate to severe compression fracture at L5, age indeterminate. Treated compression deformity at t10 andT11. Chronic superior endplate deformities at L2, L3, and L4. No aggressive appearing osseous lesion. Alignment demonstrates mild levoscoliosis and trace retrolisthesis L2 on L3. DISCS AND DEGENERATIVE CHANGES: Multilevel facet degenerative changes. SOFT TISSUES: Aortic atherosclerosis. Multiple round opacities overlying the right colon suggesting ingested material. IMPRESSION: 1. Interval moderate to severe compression fracture at L5, age indeterminate. 2. Colon Chronic superior endplate deformities at L2, L3, and L4. 3. Trace retrolisthesis of L2 on L3. 4. Mild multilevel degenerative changes. Electronically signed by: Luke Bun MD 05/05/2024 05:58 PM EST RP Workstation: [id number]    Disposition: Discharge disposition: 01-Home or Self Care       Discharge Instructions     [facility] pharmacist to adjust dose for Vancomycin , Aminoglycosides and other anti-infective therapies as requested by physician.   Complete by: As directed    [facility] to provide Cath Flo 2mg    Complete by: As directed    Administer for PICC line occlusion and as ordered by physician for other access device issues.   Anaphylaxis Kit: Provided to treat any anaphylactic reaction to the medication being provided to the patient if First Dose or when requested by physician   Complete by: As directed    Epinephrine  1mg /ml vial / amp:  Administer 0.3mg  (0.57ml) subcutaneously once for moderate to severe anaphylaxis, nurse to call physician and pharmacy when reaction occurs and call 911 if needed for immediate care   Diphenhydramine  50mg /ml IV vial: Administer 25-50mg  IV/IM PRN for first dose reaction, rash, itching, mild reaction, nurse to call physician and pharmacy when reaction occurs   Sodium Chloride  0.9% NS 500ml IV: Administer if needed for hypovolemic blood pressure drop or as ordered by physician after call to physician with anaphylactic reaction   Call MD / Call 911   Complete by: As directed    If you experience chest pain or shortness of breath, CALL 911 and be transported to the hospital emergency room.  If you develope a fever above 101 F, pus (white drainage) or increased drainage or redness at the wound, or calf pain, call your surgeon's office.   Call MD / Call 911   Complete by: As directed    If you experience chest pain or shortness of breath, CALL 911 and be transported to the hospital emergency room.  If you develope a fever above 101 F, pus (white drainage) or increased drainage or redness at the wound, or calf pain, call your surgeon's office.   Call MD / Call 911   Complete by: As directed    If you experience chest pain or shortness of breath, CALL 911 and be transported to the hospital emergency  room.  If you develope a fever above 101 F, pus (white drainage) or increased drainage or redness at the wound, or calf pain, call your surgeon's office.   Call MD / Call 911   Complete by: As directed    If you experience chest pain or shortness of breath, CALL 911 and be transported to the hospital emergency room.  If you develope a fever above 101 F, pus (white drainage) or increased drainage or redness at the wound, or calf pain, call your surgeon's office.   Call MD / Call 911   Complete by: As directed    If you experience chest pain or shortness of breath, CALL 911 and be transported to the hospital  emergency room.  If you develope a fever above 101 F, pus (white drainage) or increased drainage or redness at the wound, or calf pain, call your surgeon's office.   Change dressing on IV access line weekly and PRN   Complete by: As directed    Constipation Prevention   Complete by: As directed    Drink plenty of fluids.  Prune juice may be helpful.  You may use a stool softener, such as Colace (over the counter) 100 mg twice a day.  Use MiraLax  (over the counter) for constipation as needed.   Constipation Prevention   Complete by: As directed    Drink plenty of fluids.  Prune juice may be helpful.  You may use a stool softener, such as Colace (over the counter) 100 mg twice a day.  Use MiraLax  (over the counter) for constipation as needed.   Constipation Prevention   Complete by: As directed    Drink plenty of fluids.  Prune juice may be helpful.  You may use a stool softener, such as Colace (over the counter) 100 mg twice a day.  Use MiraLax  (over the counter) for constipation as needed.   Constipation Prevention   Complete by: As directed    Drink plenty of fluids.  Prune juice may be helpful.  You may use a stool softener, such as Colace (over the counter) 100 mg twice a day.  Use MiraLax  (over the counter) for constipation as needed.   Constipation Prevention   Complete by: As directed    Drink plenty of fluids.  Prune juice may be helpful.  You may use a stool softener, such as Colace (over the counter) 100 mg twice a day.  Use MiraLax  (over the counter) for constipation as needed.   Flush IV access with Sodium Chloride  0.9% and Heparin  10 units/ml or 100 units/ml   Complete by: As directed    Home infusion instructions - [facility]   Complete by: As directed    Instructions: Flush IV access with Sodium Chloride  0.9% and Heparin  10units/ml or 100units/ml   Change dressing on IV access line: Weekly and PRN   Instructions Cath Flo 2mg : Administer for PICC Line occlusion and as  ordered by physician for other access device   [facility] pharmacist to adjust dose for: Vancomycin , Aminoglycosides and other anti-infective therapies as requested by physician   Increase activity slowly as tolerated   Complete by: As directed    Increase activity slowly as tolerated   Complete by: As directed    Increase activity slowly as tolerated   Complete by: As directed    Increase activity slowly as tolerated   Complete by: As directed    Increase activity slowly as tolerated   Complete by: As  directed    Method of administration may be changed at the discretion of home infusion pharmacist based upon assessment of the patient and/or caregivers ability to self-administer the medication ordered   Complete by: As directed    Post-operative opioid taper instructions:   Complete by: As directed    POST-OPERATIVE OPIOID TAPER INSTRUCTIONS: It is important to wean off of your opioid medication as soon as possible. If you do not need pain medication after your surgery it is ok to stop day one. Opioids include: Codeine, Hydrocodone (Norco, Vicodin), Oxycodone (Percocet, oxycontin ) and hydromorphone  amongst others.  Long term and even short term use of opiods can cause: Increased pain response Dependence Constipation Depression Respiratory depression And more.  Withdrawal symptoms can include Flu like symptoms Nausea, vomiting And more Techniques to manage these symptoms Hydrate well Eat regular healthy meals Stay active Use relaxation techniques(deep breathing, meditating, yoga) Do Not substitute Alcohol to help with tapering If you have been on opioids for less than two weeks and do not have pain than it is ok to stop all together.  Plan to wean off of opioids This plan should start within one week post op of your joint replacement. Maintain the same interval or time between taking each dose and first decrease the dose.  Cut the total daily intake of opioids by  one tablet each day Next start to increase the time between doses. The last dose that should be eliminated is the evening dose.      Post-operative opioid taper instructions:   Complete by: As directed    POST-OPERATIVE OPIOID TAPER INSTRUCTIONS: It is important to wean off of your opioid medication as soon as possible. If you do not need pain medication after your surgery it is ok to stop day one. Opioids include: Codeine, Hydrocodone (Norco, Vicodin), Oxycodone (Percocet, oxycontin ) and hydromorphone  amongst others.  Long term and even short term use of opiods can cause: Increased pain response Dependence Constipation Depression Respiratory depression And more.  Withdrawal symptoms can include Flu like symptoms Nausea, vomiting And more Techniques to manage these symptoms Hydrate well Eat regular healthy meals Stay active Use relaxation techniques(deep breathing, meditating, yoga) Do Not substitute Alcohol to help with tapering If you have been on opioids for less than two weeks and do not have pain than it is ok to stop all together.  Plan to wean off of opioids This plan should start within one week post op of your joint replacement. Maintain the same interval or time between taking each dose and first decrease the dose.  Cut the total daily intake of opioids by one tablet each day Next start to increase the time between doses. The last dose that should be eliminated is the evening dose.      Post-operative opioid taper instructions:   Complete by: As directed    POST-OPERATIVE OPIOID TAPER INSTRUCTIONS: It is important to wean off of your opioid medication as soon as possible. If you do not need pain medication after your surgery it is ok to stop day one. Opioids include: Codeine, Hydrocodone (Norco, Vicodin), Oxycodone (Percocet, oxycontin ) and hydromorphone  amongst others.  Long term and even short term use of opiods can cause: Increased pain  response Dependence Constipation Depression Respiratory depression And more.  Withdrawal symptoms can include Flu like symptoms Nausea, vomiting And more Techniques to manage these symptoms Hydrate well Eat regular healthy meals Stay active Use relaxation techniques(deep breathing, meditating, yoga) Do Not substitute Alcohol to help with tapering If you have been on opioids  for less than two weeks and do not have pain than it is ok to stop all together.  Plan to wean off of opioids This plan should start within one week post op of your joint replacement. Maintain the same interval or time between taking each dose and first decrease the dose.  Cut the total daily intake of opioids by one tablet each day Next start to increase the time between doses. The last dose that should be eliminated is the evening dose.      Post-operative opioid taper instructions:   Complete by: As directed    POST-OPERATIVE OPIOID TAPER INSTRUCTIONS: It is important to wean off of your opioid medication as soon as possible. If you do not need pain medication after your surgery it is ok to stop day one. Opioids include: Codeine, Hydrocodone (Norco, Vicodin), Oxycodone (Percocet, oxycontin ) and hydromorphone  amongst others.  Long term and even short term use of opiods can cause: Increased pain response Dependence Constipation Depression Respiratory depression And more.  Withdrawal symptoms can include Flu like symptoms Nausea, vomiting And more Techniques to manage these symptoms Hydrate well Eat regular healthy meals Stay active Use relaxation techniques(deep breathing, meditating, yoga) Do Not substitute Alcohol to help with tapering If you have been on opioids for less than two weeks and do not have pain than it is ok to stop all together.  Plan to wean off of opioids This plan should start within one week post op of your joint replacement. Maintain the same interval or time between taking  each dose and first decrease the dose.  Cut the total daily intake of opioids by one tablet each day Next start to increase the time between doses. The last dose that should be eliminated is the evening dose.      Post-operative opioid taper instructions:   Complete by: As directed    POST-OPERATIVE OPIOID TAPER INSTRUCTIONS: It is important to wean off of your opioid medication as soon as possible. If you do not need pain medication after your surgery it is ok to stop day one. Opioids include: Codeine, Hydrocodone (Norco, Vicodin), Oxycodone (Percocet, oxycontin ) and hydromorphone  amongst others.  Long term and even short term use of opiods can cause: Increased pain response Dependence Constipation Depression Respiratory depression And more.  Withdrawal symptoms can include Flu like symptoms Nausea, vomiting And more Techniques to manage these symptoms Hydrate well Eat regular healthy meals Stay active Use relaxation techniques(deep breathing, meditating, yoga) Do Not substitute Alcohol to help with tapering If you have been on opioids for less than two weeks and do not have pain than it is ok to stop all together.  Plan to wean off of opioids This plan should start within one week post op of your joint replacement. Maintain the same interval or time between taking each dose and first decrease the dose.  Cut the total daily intake of opioids by one tablet each day Next start to increase the time between doses. The last dose that should be eliminated is the evening dose.           Contact information for follow-up providers     Harden Jerona GAILS, MD Follow up in 1 week(s).   Specialty: Orthopedic Surgery Contact information: [address] [place] [address] [phone]         Alvia Corean CROME, FNP Follow up.   Specialty: Family Medicine Contact information: [address] [place] [address] [phone]         Dennise,  Mayanka,  MD Follow up.   Specialty: Infectious Diseases Why: 06/27/23 at 10:30 am. Please call to reschedule if you are not able to make this appointment. Contact information: [address] [place] [address] [phone]              Contact information for after-discharge care     Home Medical Care     [facility]) .   Service: Home Health Services Contact information: [address] [place]  [address] [phone]                      Signed: Jerona LULLA Sage 05/30/2024, 11:05 AM  "

## 2024-05-30 NOTE — Progress Notes (Signed)
 Peripherally Inserted Central Catheter Placement  The IV Nurse has discussed with the patient and/or persons authorized to consent for the patient, the purpose of this procedure and the potential benefits and risks involved with this procedure.  The benefits include less needle sticks, lab draws from the catheter, and the patient may be discharged home with the catheter. Risks include, but not limited to, infection, bleeding, blood clot (thrombus formation), and puncture of an artery; nerve damage and irregular heartbeat and possibility to perform a PICC exchange if needed/ordered by physician.  Alternatives to this procedure were also discussed.  Bard Power PICC patient education guide, fact sheet on infection prevention and patient information card has been provided to patient /or left at bedside.    PICC Placement Documentation  PICC Single Lumen 05/30/24 Right Basilic 36 cm (Active)  Indication for Insertion or Continuance of Line Home intravenous therapies (PICC only) 05/30/24 0951  Exposed Catheter (cm) 0 cm 05/30/24 0951  Site Assessment Clean, Dry, Intact 05/30/24 0951  Line Status Saline locked;Flushed;Blood return noted 05/30/24 0951  Dressing Type Transparent;Securing device 05/30/24 0951  Dressing Status Antimicrobial disc/dressing in place;Clean, Dry, Intact 05/30/24 0951  Line Care Connections checked and tightened 05/30/24 0951  Line Adjustment (NICU/IV Team Only) No 05/30/24 0951  Dressing Intervention New dressing;Adhesive placed at insertion site (IV team only) 05/30/24 0951  Dressing Change Due 06/06/24 05/30/24 0951       [name] Fernandez 05/30/2024, 9:52 AM

## 2024-05-30 NOTE — TOC Transition Note (Signed)
 Transition of Care [facility]) - Discharge Note   Patient Details  Name: [name] Fernandez MRN: [id number] Date of Birth: December 04, 1940  [facility]) CM/SW Contact:  Rosalva Jon Bloch, RN Phone Number: 05/30/2024, 10:02 AM   Clinical Narrative:    Patient will DC to: home Anticipated DC date: 05/30/2024 Family notified: yes Transport by: car   Infected  L ankle wound, s/p I & D, 12/17  Per MD patient ready for DC today pending PICC line placement and administration of HOSPITAL IV ABX THERAPY for today. Home infusion teaching with pt's wife will be @ 3pm @ bedside.  RN, patient, patient's family, [facility], and [facility] aware  of DC plan.  Post hospital f/u noted on AVS. Pt without RX med concerns. Wife to provide transportation to home.  [name] will sign off for now as intervention is no longer needed. Please consult us  again if new needs arise.    Final next level of care: Home w Home Health Services Barriers to Discharge: No Barriers Identified   Patient Goals and CMS Choice     Choice offered to / list presented to : Patient      Discharge Placement                       Discharge Plan and Services Additional resources added to the After Visit Summary for     Discharge Planning Services: CM Consult            DME Arranged: Other see comment (iv abx therapy)   Date DME Agency Contacted: 05/29/24 Time DME Agency Contacted: [id number] Representative spoke with at DME Agency: [name] Arranged: RN, PT HH Agency: [facility] Date [facility] Agency Contacted: 05/29/24 Time HH Agency Contacted: [phone] Representative spoke with at [facility] Agency: Darleene  Social Drivers of Health (SDOH) Interventions SDOH Screenings   Food Insecurity: No Food Insecurity (05/23/2024)  Housing: Low Risk (05/23/2024)  Transportation Needs: No Transportation Needs (05/23/2024)  Utilities: Not At Risk (05/23/2024)  Depression (PHQ2-9): Low Risk (02/08/2023)  Financial  Resource Strain: Low Risk (03/07/2024)  Physical Activity: Inactive (03/07/2024)  Social Connections: Moderately Isolated (05/23/2024)  Stress: Stress Concern Present (03/07/2024)  Tobacco Use: Medium Risk (05/23/2024)     Readmission Risk Interventions    06/20/2022   11:21 AM 06/19/2022    2:50 PM  Readmission Risk Prevention Plan  Transportation Screening Complete Complete  PCP or Specialist Appt within 5-7 Days Complete Complete  Home Care Screening Complete Complete  Medication Review (RN CM) Complete Complete

## 2024-05-30 NOTE — Discharge Summary (Signed)
 " Physician Discharge Summary  Patient ID: [name] Fernandez MRN: [id number] DOB/AGE: 1941/01/16 83 y.o.  Admit date: 05/23/2024 Discharge date: 05/30/2024  Admission Diagnoses:  Principal Problem:   Infected wound Active Problems:   Pseudomonas aeruginosa infection   Discharge Diagnoses:  Same  Past Medical History:  Diagnosis Date   Anemia    Celiac disease    diagnoses 06/24/12   Chronic ulcer of ankle (HCC)    left   COPD (chronic obstructive pulmonary disease) (HCC)    Esophageal stricture    GERD (gastroesophageal reflux disease)    Hiatal hernia    Hypertension    patient states  no current HTN   Hypothyroidism    Pre-diabetes    Prediabetes    Shortness of breath    from oxycodone  at times   Vitamin B12 deficiency    Vitamin D  deficiency     Surgeries: Procedures: INCISION AND DRAINAGE OF LEFT ANKLE on 05/28/2024   Consultants:   Discharged Condition: Improved  Hospital Course: [name] Fernandez is an 83 y.o. male who was admitted 05/23/2024 with a chief complaint of No chief complaint on file. , and found to have a diagnosis of Infected wound.  They were brought to the operating room on 05/28/2024 and underwent the above named procedures.    They were given perioperative antibiotics:  Anti-infectives (From admission, onward)    Start     Dose/Rate Route Frequency Ordered Stop   05/30/24 1015  amoxicillin (AMOXIL) capsule 1,000 mg        1,000 mg Oral Every 8 hours 05/30/24 0925     05/30/24 0000  amoxicillin (AMOXIL) 500 MG capsule        1,000 mg Oral 3 times daily 05/30/24 0924 06/25/24 2359   05/29/24 2200  meropenem  (MERREM ) 2 g in sodium chloride  0.9 % 100 mL IVPB        2 g 280 mL/hr over 30 Minutes Intravenous Every 12 hours 05/29/24 1432     05/29/24 0000  meropenem  (MERREM ) IVPB        2 g Intravenous Every 12 hours 05/29/24 1619 06/25/24 2359   05/28/24 1600  meropenem  (MERREM ) 2 g in sodium chloride  0.9 % 100 mL IVPB  Status:  Discontinued         2 g 280 mL/hr over 30 Minutes Intravenous Every 8 hours 05/28/24 1304 05/29/24 1432   05/28/24 0856  tobramycin  (NEBCIN ) powder  Status:  Discontinued          As needed 05/28/24 0856 05/28/24 0919   05/26/24 1600  meropenem  (MERREM ) 1 g in sodium chloride  0.9 % 100 mL IVPB  Status:  Discontinued        1 g 200 mL/hr over 30 Minutes Intravenous Every 8 hours 05/26/24 1518 05/28/24 1304   05/23/24 1930  vancomycin  (VANCOCIN ) IVPB 1000 mg/200 mL premix        1,000 mg 200 mL/hr over 60 Minutes Intravenous Every 12 hours 05/23/24 1840 05/23/24 2331   05/23/24 1930  piperacillin -tazobactam (ZOSYN ) IVPB 3.375 g  Status:  Discontinued        3.375 g 12.5 mL/hr over 240 Minutes Intravenous Every 8 hours 05/23/24 1844 05/26/24 1518   05/23/24 1145  ceFAZolin  (ANCEF ) IVPB 2g/100 mL premix        2 g 200 mL/hr over 30 Minutes Intravenous On call to O.R. 05/23/24 1137 05/23/24 1440     .  They were given compression stockings, early ambulation, and chemoprophylaxis  for DVT prophylaxis.  They benefited maximally from their hospital stay and there were no complications.    Recent vital signs:  Vitals:   05/30/24 0340 05/30/24 0754  BP: 131/72 (!) 145/122  Pulse: 82 83  Resp: 17 16  Temp: 98.4 F (36.9 C) 98.5 F (36.9 C)  SpO2: 94% 95%    Recent laboratory studies:  Results for orders placed or performed during the hospital encounter of 05/23/24  CBC WITH DIFFERENTIAL   Collection Time: 05/23/24 12:12 PM  Result Value Ref Range   WBC 8.4 4.0 - 10.5 K/uL   RBC 3.46 (L) 4.22 - 5.81 MIL/uL   Hemoglobin 11.1 (L) 13.0 - 17.0 g/dL   HCT 67.1 (L) 60.9 - 47.9 %   MCV 94.8 80.0 - 100.0 fL   MCH 32.1 26.0 - 34.0 pg   MCHC 33.8 30.0 - 36.0 g/dL   RDW 83.8 (H) 88.4 - 84.4 %   Platelets 495 (H) 150 - 400 K/uL   nRBC 0.0 0.0 - 0.2 %   Neutrophils Relative % 68 %   Neutro Abs 5.8 1.7 - 7.7 K/uL   Lymphocytes Relative 16 %   Lymphs Abs 1.4 0.7 - 4.0 K/uL   Monocytes Relative 12 %    Monocytes Absolute 1.0 0.1 - 1.0 K/uL   Eosinophils Relative 3 %   Eosinophils Absolute 0.2 0.0 - 0.5 K/uL   Basophils Relative 1 %   Basophils Absolute 0.1 0.0 - 0.1 K/uL   Immature Granulocytes 0 %   Abs Immature Granulocytes 0.02 0.00 - 0.07 K/uL  Comprehensive metabolic panel   Collection Time: 05/23/24 12:12 PM  Result Value Ref Range   Sodium 135 135 - 145 mmol/L   Potassium 4.5 3.5 - 5.1 mmol/L   Chloride 102 98 - 111 mmol/L   CO2 25 22 - 32 mmol/L   Glucose, Bld 87 70 - 99 mg/dL   BUN 21 8 - 23 mg/dL   Creatinine, Ser 8.95 0.61 - 1.24 mg/dL   Calcium  8.6 (L) 8.9 - 10.3 mg/dL   Total Protein 7.7 6.5 - 8.1 g/dL   Albumin 2.9 (L) 3.5 - 5.0 g/dL   AST 48 (H) 15 - 41 U/L   ALT 24 0 - 44 U/L   Alkaline Phosphatase 99 38 - 126 U/L   Total Bilirubin 0.8 0.0 - 1.2 mg/dL   GFR, Estimated >39 >39 mL/min   Anion gap 8 5 - 15  Aerobic/Anaerobic Culture w Gram Stain (surgical/deep wound)   Collection Time: 05/23/24  2:27 PM   Specimen: Wound  Result Value Ref Range   Specimen Description TISSUE    Special Requests LEFT ANKLE DEBRIDEMENT    Gram Stain      NO WBC SEEN ABUNDANT GRAM NEGATIVE RODS Performed at [facility], [address] [place], [place] [address]    Culture      ABUNDANT PSEUDOMONAS AERUGINOSA NO ANAEROBES ISOLATED FEW ENTEROCOCCUS FAECALIS    Report Status 05/30/2024 FINAL    Organism ID, Bacteria PSEUDOMONAS AERUGINOSA    Organism ID, Bacteria ENTEROCOCCUS FAECALIS       Susceptibility   Enterococcus faecalis - MIC*    AMPICILLIN <=2 SENSITIVE Sensitive     VANCOMYCIN  1 SENSITIVE Sensitive     GENTAMICIN SYNERGY SENSITIVE Sensitive     * FEW ENTEROCOCCUS FAECALIS   Pseudomonas aeruginosa - MIC*    MEROPENEM  2 SENSITIVE Sensitive     CIPROFLOXACIN  >=4 RESISTANT Resistant     IMIPENEM  4 INTERMEDIATE Intermediate     CEFTAZIDIME/AVIBACTAM 8 SENSITIVE Sensitive     CEFTOLOZANE/TAZOBACTAM 1 SENSITIVE Sensitive     TOBRAMYCIN  <=1 SENSITIVE  Sensitive     CEFTAZIDIME 16 RESISTANT Resistant     * ABUNDANT PSEUDOMONAS AERUGINOSA  Glucose, capillary   Collection Time: 05/24/24  5:03 AM  Result Value Ref Range   Glucose-Capillary 111 (H) 70 - 99 mg/dL  MRSA Next Gen by PCR, Nasal   Collection Time: 05/27/24 10:44 AM   Specimen: Nasal Mucosa; Nasal Swab  Result Value Ref Range   MRSA by PCR Next Gen NOT DETECTED NOT DETECTED  Basic metabolic panel with GFR   Collection Time: 05/30/24  3:47 AM  Result Value Ref Range   Sodium 135 135 - 145 mmol/L   Potassium 4.5 3.5 - 5.1 mmol/L   Chloride 101 98 - 111 mmol/L   CO2 27 22 - 32 mmol/L   Glucose, Bld 90 70 - 99 mg/dL   BUN 16 8 - 23 mg/dL   Creatinine, Ser 9.19 0.61 - 1.24 mg/dL   Calcium  8.9 8.9 - 10.3 mg/dL   GFR, Estimated >39 >39 mL/min   Anion gap 7 5 - 15    Discharge Medications:   Allergies as of 05/30/2024       Reactions   Feraheme  [ferumoxytol ] Swelling, Other (See Comments)   Swelling of lips and tongue, could not talk 30 minutes after the infusion.   Latex Other (See Comments)   Latex butterfly bandages tore off the skin and caused terrible blisters   Sulfa  Antibiotics Hives   Severe itching, blisters   Wound Dressing Adhesive Other (See Comments)   Latex butterfly bandages tore off the skin and caused terrible blisters   Levothyroxine  Swelling, Other (See Comments)   Lip swelling   Doxycycline  Nausea Only        Medication List     TAKE these medications    amoxicillin 500 MG capsule Commonly known as: AMOXIL Take 2 capsules (1,000 mg total) by mouth 3 (three) times daily for 26 days.   betamethasone  dipropionate 0.05 % cream Apply 1 Application topically 2 (two) times daily as needed (skin rash/irritation.).   diphenhydrAMINE  25 MG tablet Commonly known as: BENADRYL  Take 25 mg by mouth in the morning and at bedtime.   doxepin  10 MG capsule Commonly known as: SINEQUAN  Take 10-20 mg by mouth at bedtime as needed (itching).    hydrOXYzine  25 MG tablet Commonly known as: ATARAX  TAKE 1 TABLET 3 x / day  AS NEEDED FOR ANXIETY OR SLEEP                                                             /                                                                   TAKE  BY                                                 MOUTH   MAGNESIUM  PO Take 2 capsules by mouth at bedtime.   MELATONIN PO Take 10 mg by mouth at bedtime.   meropenem  IVPB Commonly known as: MERREM  Inject 2 g into the vein every 12 (twelve) hours for 27 days. Indication:  Psuedomonas ankle wound First Dose: Yes Last Day of Therapy:  06/25/24 Labs - Once weekly:  CBC/D and BMP, Labs - Once weekly: ESR and CRP Method of administration: Mini-Bag Plus / Gravity Method of administration may be changed at the discretion of home infusion pharmacist based upon assessment of the patient and/or caregiver's ability to self-administer the medication ordered.   omeprazole  20 MG tablet Commonly known as: PRILOSEC  OTC Take 20 mg by mouth daily before breakfast.   Oxycodone  HCl 10 MG Tabs Take 1 tablet (10 mg total) by mouth 3 (three) times daily as needed. What changed:  how much to take when to take this reasons to take this   torsemide  20 MG tablet Commonly known as: DEMADEX  Take 2 tablets (40 mg total) by mouth daily as needed (For worsening swelling or more than 2 pound weight gain from your baseline.). Take 2 tablet  Daily for Fluid Retention / Leg Swelling What changed: how much to take   triamcinolone  cream 0.1 % Commonly known as: KENALOG  APPLY TO THE LEFT LEG UNDER THE WRAP DAILY WHERE THERE IS ITCHING OR RASH X 2 WEEKS OR UNTIL HEALED What changed: See the new instructions.   VASHE WOUND EX Apply 1 Application topically daily. ANKLE WOUND   vitamin C  1000 MG tablet Take 1,000 mg by mouth in the morning.               Discharge Care Instructions  (From admission, onward)            Start     Ordered   05/29/24 0000  Change dressing on IV access line weekly and PRN  (Home infusion instructions - [facility] )        05/29/24 1619            Diagnostic Studies: US  EKG SITE RITE Result Date: 05/29/2024 If Site Rite image not attached, placement could not be confirmed due to current cardiac rhythm.  CT Lumbar Spine Wo Contrast Result Date: 05/05/2024 EXAM: CT OF THE LUMBAR SPINE WITHOUT CONTRAST 05/05/2024 06:33:08 PM TECHNIQUE: CT of the lumbar spine was performed without the administration of intravenous contrast. Multiplanar reformatted images are provided for review. Automated exposure control, iterative reconstruction, and/or weight based adjustment of the mA/kV was utilized to reduce the radiation dose to as low as reasonably achievable. COMPARISON: Radiographs 05/05/2024 and CT abdomen 09/21/2023. CLINICAL HISTORY: Low back pain, history of previous hardware. FINDINGS: BONES AND ALIGNMENT: Interval 65 percent compression fracture at L5 primarily along the superior endplate with 5 mm posterior bony retropulsion indicating middle column involvement, mild sclerosis in the vertebral body, and minimal residual superior endplate irregularity compatible with subacute chronicity. Remote endplate compression fractures at L2, L3, and L4. Normal vertebral body heights for other visualized levels. Normal alignment. No suspicious bone lesion. DEGENERATIVE CHANGES: Borderline central stenosis at L4-L5 due to disc bulge and posterior bony retropulsion. Remote endplate compression fractures at L2,  L3, and L4. The L5 compression fracture is also a degenerative change. SOFT TISSUES: Systemic atherosclerosis is present, including the aorta and iliac arteries. No acute abnormality. IMPRESSION: 1. Subacute 65% compression fracture at L5 with 5 mm posterior bony retropulsion and mild sclerosis. 2. Borderline central stenosis at L4-L5 due to disc bulge and posterior bony retropulsion.  3. Remote endplate compression fractures at L2, L3, and L4. 4. Systemic atherosclerosis involving the aorta and iliac arteries. Electronically signed by: Ryan Salvage MD 05/05/2024 06:47 PM EST RP Workstation: [id number]   DG Lumbar Spine Complete Result Date: 05/05/2024 EXAM: 4 VIEW(S) XRAY OF THE LUMBAR SPINE 05/05/2024 05:06:17 PM COMPARISON: MRI 01/24/2013. CT abdomen and pelvis for 11/25. CLINICAL HISTORY: back pain FINDINGS: LUMBAR SPINE: BONES: Interval moderate to severe compression fracture at L5, age indeterminate. Treated compression deformity at t10 andT11. Chronic superior endplate deformities at L2, L3, and L4. No aggressive appearing osseous lesion. Alignment demonstrates mild levoscoliosis and trace retrolisthesis L2 on L3. DISCS AND DEGENERATIVE CHANGES: Multilevel facet degenerative changes. SOFT TISSUES: Aortic atherosclerosis. Multiple round opacities overlying the right colon suggesting ingested material. IMPRESSION: 1. Interval moderate to severe compression fracture at L5, age indeterminate. 2. Colon Chronic superior endplate deformities at L2, L3, and L4. 3. Trace retrolisthesis of L2 on L3. 4. Mild multilevel degenerative changes. Electronically signed by: Luke Bun MD 05/05/2024 05:58 PM EST RP Workstation: [id number]    Disposition: Discharge disposition: 01-Home or Self Care       Discharge Instructions     [facility] pharmacist to adjust dose for Vancomycin , Aminoglycosides and other anti-infective therapies as requested by physician.   Complete by: As directed    [facility] to provide Cath Flo 2mg    Complete by: As directed    Administer for PICC line occlusion and as ordered by physician for other access device issues.   Anaphylaxis Kit: Provided to treat any anaphylactic reaction to the medication being provided to the patient if First Dose or when requested by physician   Complete by: As directed    Epinephrine  1mg /ml vial / amp:  Administer 0.3mg  (0.80ml) subcutaneously once for moderate to severe anaphylaxis, nurse to call physician and pharmacy when reaction occurs and call 911 if needed for immediate care   Diphenhydramine  50mg /ml IV vial: Administer 25-50mg  IV/IM PRN for first dose reaction, rash, itching, mild reaction, nurse to call physician and pharmacy when reaction occurs   Sodium Chloride  0.9% NS 500ml IV: Administer if needed for hypovolemic blood pressure drop or as ordered by physician after call to physician with anaphylactic reaction   Call MD / Call 911   Complete by: As directed    If you experience chest pain or shortness of breath, CALL 911 and be transported to the hospital emergency room.  If you develope a fever above 101 F, pus (white drainage) or increased drainage or redness at the wound, or calf pain, call your surgeon's office.   Call MD / Call 911   Complete by: As directed    If you experience chest pain or shortness of breath, CALL 911 and be transported to the hospital emergency room.  If you develope a fever above 101 F, pus (white drainage) or increased drainage or redness at the wound, or calf pain, call your surgeon's office.   Change dressing on IV access line weekly and PRN   Complete by: As directed    Constipation Prevention   Complete by: As directed    Drink plenty of  fluids.  Prune juice may be helpful.  You may use a stool softener, such as Colace (over the counter) 100 mg twice a day.  Use MiraLax  (over the counter) for constipation as needed.   Constipation Prevention   Complete by: As directed    Drink plenty of fluids.  Prune juice may be helpful.  You may use a stool softener, such as Colace (over the counter) 100 mg twice a day.  Use MiraLax  (over the counter) for constipation as needed.   Flush IV access with Sodium Chloride  0.9% and Heparin  10 units/ml or 100 units/ml   Complete by: As directed    Home infusion instructions - [facility]   Complete by: As  directed    Instructions: Flush IV access with Sodium Chloride  0.9% and Heparin  10units/ml or 100units/ml   Change dressing on IV access line: Weekly and PRN   Instructions Cath Flo 2mg : Administer for PICC Line occlusion and as ordered by physician for other access device   [facility] pharmacist to adjust dose for: Vancomycin , Aminoglycosides and other anti-infective therapies as requested by physician   Increase activity slowly as tolerated   Complete by: As directed    Increase activity slowly as tolerated   Complete by: As directed    Method of administration may be changed at the discretion of home infusion pharmacist based upon assessment of the patient and/or caregivers ability to self-administer the medication ordered   Complete by: As directed    Post-operative opioid taper instructions:   Complete by: As directed    POST-OPERATIVE OPIOID TAPER INSTRUCTIONS: It is important to wean off of your opioid medication as soon as possible. If you do not need pain medication after your surgery it is ok to stop day one. Opioids include: Codeine, Hydrocodone (Norco, Vicodin), Oxycodone (Percocet, oxycontin ) and hydromorphone  amongst others.  Long term and even short term use of opiods can cause: Increased pain response Dependence Constipation Depression Respiratory depression And more.  Withdrawal symptoms can include Flu like symptoms Nausea, vomiting And more Techniques to manage these symptoms Hydrate well Eat regular healthy meals Stay active Use relaxation techniques(deep breathing, meditating, yoga) Do Not substitute Alcohol to help with tapering If you have been on opioids for less than two weeks and do not have pain than it is ok to stop all together.  Plan to wean off of opioids This plan should start within one week post op of your joint replacement. Maintain the same interval or time between taking each dose and first decrease the dose.  Cut the total daily  intake of opioids by one tablet each day Next start to increase the time between doses. The last dose that should be eliminated is the evening dose.      Post-operative opioid taper instructions:   Complete by: As directed    POST-OPERATIVE OPIOID TAPER INSTRUCTIONS: It is important to wean off of your opioid medication as soon as possible. If you do not need pain medication after your surgery it is ok to stop day one. Opioids include: Codeine, Hydrocodone (Norco, Vicodin), Oxycodone (Percocet, oxycontin ) and hydromorphone  amongst others.  Long term and even short term use of opiods can cause: Increased pain response Dependence Constipation Depression Respiratory depression And more.  Withdrawal symptoms can include Flu like symptoms Nausea, vomiting And more Techniques to manage these symptoms Hydrate well Eat regular healthy meals Stay active Use relaxation techniques(deep breathing, meditating, yoga) Do Not substitute Alcohol to help with tapering If you have been on  opioids for less than two weeks and do not have pain than it is ok to stop all together.  Plan to wean off of opioids This plan should start within one week post op of your joint replacement. Maintain the same interval or time between taking each dose and first decrease the dose.  Cut the total daily intake of opioids by one tablet each day Next start to increase the time between doses. The last dose that should be eliminated is the evening dose.           Contact information for follow-up providers     Harden Jerona GAILS, MD Follow up in 1 week(s).   Specialty: Orthopedic Surgery Contact information: [address] [place]  [place] [address] [phone]         Alvia Corean CROME, FNP Follow up.   Specialty: Family Medicine Contact information: [address] [place] [address] [phone]         Dennise Kingsley, MD Follow up.   Specialty: Infectious Diseases Why: 06/27/23  at 10:30 am. Please call to reschedule if you are not able to make this appointment. Contact information: [address] [place] [address] [phone]              Contact information for after-discharge care     Home Medical Care     [facility]) .   Service: Home Health Services Contact information: [address] [place]  [address] [phone]                      Signed: Jerona GAILS Harden 05/30/2024, 9:32 AM  "

## 2024-05-30 NOTE — Progress Notes (Signed)
" ° °  Brief Progress Note   _____________________________________________________________________________________________________________  Patient Name: [name] Fernandez Patient DOB: Feb 25, 1941 Date: 05/30/2024     Data: Patient being DC home today with Home Health. Patient is requiring PICC line to be placed before DC.    Action: PICC order placed yesterday. Reached out to VAS/IV team Manager Christina RN.    Response:  PICC placed.  _____________________________________________________________________________________________________________  [facility] RN Expeditor Nataliyah Packham Please contact us  directly via secure chat (search for [facility]) or by calling us  at [phone] [facility]).  "

## 2024-05-30 NOTE — Plan of Care (Signed)
  Problem: Education: Goal: Knowledge of General Education information will improve Description: Including pain rating scale, medication(s)/side effects and non-pharmacologic comfort measures Outcome: Progressing   Problem: Clinical Measurements: Goal: Diagnostic test results will improve Outcome: Progressing   Problem: Activity: Goal: Risk for activity intolerance will decrease Outcome: Progressing   Problem: Coping: Goal: Level of anxiety will decrease Outcome: Progressing

## 2024-05-30 NOTE — Discharge Summary (Signed)
 " Physician Discharge Summary  Patient ID: [name] Fernandez MRN: [id number] DOB/AGE: 08-15-1940 83 y.o.  Admit date: 05/23/2024 Discharge date: 05/30/2024  Admission Diagnoses:  Principal Problem:   Infected wound Active Problems:   Pseudomonas aeruginosa infection   Discharge Diagnoses:  Same  Past Medical History:  Diagnosis Date   Anemia    Celiac disease    diagnoses 06/24/12   Chronic ulcer of ankle (HCC)    left   COPD (chronic obstructive pulmonary disease) (HCC)    Esophageal stricture    GERD (gastroesophageal reflux disease)    Hiatal hernia    Hypertension    patient states  no current HTN   Hypothyroidism    Pre-diabetes    Prediabetes    Shortness of breath    from oxycodone  at times   Vitamin B12 deficiency    Vitamin D  deficiency     Surgeries: Procedures: INCISION AND DRAINAGE OF LEFT ANKLE on 05/28/2024   Consultants:   Discharged Condition: Improved  Hospital Course: [name] Fernandez is an 83 y.o. male who was admitted 05/23/2024 with a chief complaint of No chief complaint on file. , and found to have a diagnosis of Infected wound.  They were brought to the operating room on 05/28/2024 and underwent the above named procedures.    They were given perioperative antibiotics:  Anti-infectives (From admission, onward)    Start     Dose/Rate Route Frequency Ordered Stop   05/29/24 2200  meropenem  (MERREM ) 2 g in sodium chloride  0.9 % 100 mL IVPB        2 g 280 mL/hr over 30 Minutes Intravenous Every 12 hours 05/29/24 1432     05/29/24 0000  meropenem  (MERREM ) IVPB        2 g Intravenous Every 12 hours 05/29/24 1619 06/25/24 2359   05/28/24 1600  meropenem  (MERREM ) 2 g in sodium chloride  0.9 % 100 mL IVPB  Status:  Discontinued        2 g 280 mL/hr over 30 Minutes Intravenous Every 8 hours 05/28/24 1304 05/29/24 1432   05/28/24 0856  tobramycin  (NEBCIN ) powder  Status:  Discontinued          As needed 05/28/24 0856 05/28/24 0919   05/26/24 1600   meropenem  (MERREM ) 1 g in sodium chloride  0.9 % 100 mL IVPB  Status:  Discontinued        1 g 200 mL/hr over 30 Minutes Intravenous Every 8 hours 05/26/24 1518 05/28/24 1304   05/23/24 1930  vancomycin  (VANCOCIN ) IVPB 1000 mg/200 mL premix        1,000 mg 200 mL/hr over 60 Minutes Intravenous Every 12 hours 05/23/24 1840 05/23/24 2331   05/23/24 1930  piperacillin -tazobactam (ZOSYN ) IVPB 3.375 g  Status:  Discontinued        3.375 g 12.5 mL/hr over 240 Minutes Intravenous Every 8 hours 05/23/24 1844 05/26/24 1518   05/23/24 1145  ceFAZolin  (ANCEF ) IVPB 2g/100 mL premix        2 g 200 mL/hr over 30 Minutes Intravenous On call to O.R. 05/23/24 1137 05/23/24 1440     .  They were given compression stockings, early ambulation, and chemoprophylaxis for DVT prophylaxis.  They benefited maximally from their hospital stay and there were no complications.    Recent vital signs:  Vitals:   05/30/24 0340 05/30/24 0754  BP: 131/72 (!) 145/122  Pulse: 82 83  Resp: 17 16  Temp: 98.4 F (36.9 C) 98.5 F (36.9 C)  SpO2: 94% 95%    Recent laboratory studies:  Results for orders placed or performed during the hospital encounter of 05/23/24  CBC WITH DIFFERENTIAL   Collection Time: 05/23/24 12:12 PM  Result Value Ref Range   WBC 8.4 4.0 - 10.5 K/uL   RBC 3.46 (L) 4.22 - 5.81 MIL/uL   Hemoglobin 11.1 (L) 13.0 - 17.0 g/dL   HCT 67.1 (L) 60.9 - 47.9 %   MCV 94.8 80.0 - 100.0 fL   MCH 32.1 26.0 - 34.0 pg   MCHC 33.8 30.0 - 36.0 g/dL   RDW 83.8 (H) 88.4 - 84.4 %   Platelets 495 (H) 150 - 400 K/uL   nRBC 0.0 0.0 - 0.2 %   Neutrophils Relative % 68 %   Neutro Abs 5.8 1.7 - 7.7 K/uL   Lymphocytes Relative 16 %   Lymphs Abs 1.4 0.7 - 4.0 K/uL   Monocytes Relative 12 %   Monocytes Absolute 1.0 0.1 - 1.0 K/uL   Eosinophils Relative 3 %   Eosinophils Absolute 0.2 0.0 - 0.5 K/uL   Basophils Relative 1 %   Basophils Absolute 0.1 0.0 - 0.1 K/uL   Immature Granulocytes 0 %   Abs Immature  Granulocytes 0.02 0.00 - 0.07 K/uL  Comprehensive metabolic panel   Collection Time: 05/23/24 12:12 PM  Result Value Ref Range   Sodium 135 135 - 145 mmol/L   Potassium 4.5 3.5 - 5.1 mmol/L   Chloride 102 98 - 111 mmol/L   CO2 25 22 - 32 mmol/L   Glucose, Bld 87 70 - 99 mg/dL   BUN 21 8 - 23 mg/dL   Creatinine, Ser 8.95 0.61 - 1.24 mg/dL   Calcium  8.6 (L) 8.9 - 10.3 mg/dL   Total Protein 7.7 6.5 - 8.1 g/dL   Albumin 2.9 (L) 3.5 - 5.0 g/dL   AST 48 (H) 15 - 41 U/L   ALT 24 0 - 44 U/L   Alkaline Phosphatase 99 38 - 126 U/L   Total Bilirubin 0.8 0.0 - 1.2 mg/dL   GFR, Estimated >39 >39 mL/min   Anion gap 8 5 - 15  Aerobic/Anaerobic Culture w Gram Stain (surgical/deep wound)   Collection Time: 05/23/24  2:27 PM   Specimen: Wound  Result Value Ref Range   Specimen Description TISSUE    Special Requests LEFT ANKLE DEBRIDEMENT    Gram Stain NO WBC SEEN ABUNDANT GRAM NEGATIVE RODS     Culture      ABUNDANT PSEUDOMONAS AERUGINOSA NO ANAEROBES ISOLATED FEW ENTEROCOCCUS FAECALIS SUSCEPTIBILITIES TO FOLLOW Performed at [facility], [address], [place], [place] [address]    Report Status PENDING    Organism ID, Bacteria PSEUDOMONAS AERUGINOSA       Susceptibility   Pseudomonas aeruginosa - MIC*    MEROPENEM  2 SENSITIVE Sensitive     CIPROFLOXACIN  >=4 RESISTANT Resistant     IMIPENEM 4 INTERMEDIATE Intermediate     CEFTAZIDIME/AVIBACTAM 8 SENSITIVE Sensitive     CEFTOLOZANE/TAZOBACTAM 1 SENSITIVE Sensitive     TOBRAMYCIN  <=1 SENSITIVE Sensitive     CEFTAZIDIME 16 RESISTANT Resistant     * ABUNDANT PSEUDOMONAS AERUGINOSA  Glucose, capillary   Collection Time: 05/24/24  5:03 AM  Result Value Ref Range   Glucose-Capillary 111 (H) 70 - 99 mg/dL  MRSA Next Gen by PCR, Nasal   Collection Time: 05/27/24 10:44 AM   Specimen: Nasal Mucosa; Nasal Swab  Result Value Ref Range   MRSA by PCR Next Gen  NOT DETECTED NOT DETECTED  Basic metabolic panel with GFR   Collection  Time: 05/30/24  3:47 AM  Result Value Ref Range   Sodium 135 135 - 145 mmol/L   Potassium 4.5 3.5 - 5.1 mmol/L   Chloride 101 98 - 111 mmol/L   CO2 27 22 - 32 mmol/L   Glucose, Bld 90 70 - 99 mg/dL   BUN 16 8 - 23 mg/dL   Creatinine, Ser 9.19 0.61 - 1.24 mg/dL   Calcium  8.9 8.9 - 10.3 mg/dL   GFR, Estimated >39 >39 mL/min   Anion gap 7 5 - 15    Discharge Medications:   Allergies as of 05/30/2024       Reactions   Feraheme  [ferumoxytol ] Swelling, Other (See Comments)   Swelling of lips and tongue, could not talk 30 minutes after the infusion.   Latex Other (See Comments)   Latex butterfly bandages tore off the skin and caused terrible blisters   Sulfa  Antibiotics Hives   Severe itching, blisters   Wound Dressing Adhesive Other (See Comments)   Latex butterfly bandages tore off the skin and caused terrible blisters   Levothyroxine  Swelling, Other (See Comments)   Lip swelling   Doxycycline  Nausea Only        Medication List     TAKE these medications    betamethasone  dipropionate 0.05 % cream Apply 1 Application topically 2 (two) times daily as needed (skin rash/irritation.).   diphenhydrAMINE  25 MG tablet Commonly known as: BENADRYL  Take 25 mg by mouth in the morning and at bedtime.   doxepin  10 MG capsule Commonly known as: SINEQUAN  Take 10-20 mg by mouth at bedtime as needed (itching).   hydrOXYzine  25 MG tablet Commonly known as: ATARAX  TAKE 1 TABLET 3 x / day  AS NEEDED FOR ANXIETY OR SLEEP                                                             /                                                                   TAKE                                         BY                                                 MOUTH   MAGNESIUM  PO Take 2 capsules by mouth at bedtime.   MELATONIN PO Take 10 mg by mouth at bedtime.   meropenem  IVPB Commonly known as: MERREM  Inject 2 g into the vein every 12 (twelve) hours for 27 days. Indication:  Psuedomonas ankle  wound First Dose: Yes Last Day of Therapy:  06/25/24 Labs - Once weekly:  CBC/D and BMP, Labs - Once  weekly: ESR and CRP Method of administration: Mini-Bag Plus / Gravity Method of administration may be changed at the discretion of home infusion pharmacist based upon assessment of the patient and/or caregiver's ability to self-administer the medication ordered.   omeprazole  20 MG tablet Commonly known as: PRILOSEC  OTC Take 20 mg by mouth daily before breakfast.   Oxycodone  HCl 10 MG Tabs Take 1 tablet (10 mg total) by mouth 3 (three) times daily as needed. What changed:  how much to take when to take this reasons to take this   torsemide  20 MG tablet Commonly known as: DEMADEX  Take 2 tablets (40 mg total) by mouth daily as needed (For worsening swelling or more than 2 pound weight gain from your baseline.). Take 2 tablet  Daily for Fluid Retention / Leg Swelling What changed: how much to take   triamcinolone  cream 0.1 % Commonly known as: KENALOG  APPLY TO THE LEFT LEG UNDER THE WRAP DAILY WHERE THERE IS ITCHING OR RASH X 2 WEEKS OR UNTIL HEALED What changed: See the new instructions.   VASHE WOUND EX Apply 1 Application topically daily. ANKLE WOUND   vitamin C  1000 MG tablet Take 1,000 mg by mouth in the morning.               Discharge Care Instructions  (From admission, onward)           Start     Ordered   05/29/24 0000  Change dressing on IV access line weekly and PRN  (Home infusion instructions - [facility] )        05/29/24 1619            Diagnostic Studies: US  EKG SITE RITE Result Date: 05/29/2024 If Site Rite image not attached, placement could not be confirmed due to current cardiac rhythm.  CT Lumbar Spine Wo Contrast Result Date: 05/05/2024 EXAM: CT OF THE LUMBAR SPINE WITHOUT CONTRAST 05/05/2024 06:33:08 PM TECHNIQUE: CT of the lumbar spine was performed without the administration of intravenous contrast. Multiplanar  reformatted images are provided for review. Automated exposure control, iterative reconstruction, and/or weight based adjustment of the mA/kV was utilized to reduce the radiation dose to as low as reasonably achievable. COMPARISON: Radiographs 05/05/2024 and CT abdomen 09/21/2023. CLINICAL HISTORY: Low back pain, history of previous hardware. FINDINGS: BONES AND ALIGNMENT: Interval 65 percent compression fracture at L5 primarily along the superior endplate with 5 mm posterior bony retropulsion indicating middle column involvement, mild sclerosis in the vertebral body, and minimal residual superior endplate irregularity compatible with subacute chronicity. Remote endplate compression fractures at L2, L3, and L4. Normal vertebral body heights for other visualized levels. Normal alignment. No suspicious bone lesion. DEGENERATIVE CHANGES: Borderline central stenosis at L4-L5 due to disc bulge and posterior bony retropulsion. Remote endplate compression fractures at L2, L3, and L4. The L5 compression fracture is also a degenerative change. SOFT TISSUES: Systemic atherosclerosis is present, including the aorta and iliac arteries. No acute abnormality. IMPRESSION: 1. Subacute 65% compression fracture at L5 with 5 mm posterior bony retropulsion and mild sclerosis. 2. Borderline central stenosis at L4-L5 due to disc bulge and posterior bony retropulsion. 3. Remote endplate compression fractures at L2, L3, and L4. 4. Systemic atherosclerosis involving the aorta and iliac arteries. Electronically signed by: Ryan Salvage MD 05/05/2024 06:47 PM EST RP Workstation: [id number]   DG Lumbar Spine Complete Result Date: 05/05/2024 EXAM: 4 VIEW(S) XRAY OF THE LUMBAR SPINE 05/05/2024 05:06:17 PM COMPARISON: MRI 01/24/2013. CT abdomen and pelvis for  11/25. CLINICAL HISTORY: back pain FINDINGS: LUMBAR SPINE: BONES: Interval moderate to severe compression fracture at L5, age indeterminate. Treated compression deformity at t10  andT11. Chronic superior endplate deformities at L2, L3, and L4. No aggressive appearing osseous lesion. Alignment demonstrates mild levoscoliosis and trace retrolisthesis L2 on L3. DISCS AND DEGENERATIVE CHANGES: Multilevel facet degenerative changes. SOFT TISSUES: Aortic atherosclerosis. Multiple round opacities overlying the right colon suggesting ingested material. IMPRESSION: 1. Interval moderate to severe compression fracture at L5, age indeterminate. 2. Colon Chronic superior endplate deformities at L2, L3, and L4. 3. Trace retrolisthesis of L2 on L3. 4. Mild multilevel degenerative changes. Electronically signed by: Luke Bun MD 05/05/2024 05:58 PM EST RP Workstation: [id number]    Disposition: Discharge disposition: 01-Home or Self Care       Discharge Instructions     [facility] pharmacist to adjust dose for Vancomycin , Aminoglycosides and other anti-infective therapies as requested by physician.   Complete by: As directed    [facility] to provide Cath Flo 2mg    Complete by: As directed    Administer for PICC line occlusion and as ordered by physician for other access device issues.   Anaphylaxis Kit: Provided to treat any anaphylactic reaction to the medication being provided to the patient if First Dose or when requested by physician   Complete by: As directed    Epinephrine  1mg /ml vial / amp: Administer 0.3mg  (0.23ml) subcutaneously once for moderate to severe anaphylaxis, nurse to call physician and pharmacy when reaction occurs and call 911 if needed for immediate care   Diphenhydramine  50mg /ml IV vial: Administer 25-50mg  IV/IM PRN for first dose reaction, rash, itching, mild reaction, nurse to call physician and pharmacy when reaction occurs   Sodium Chloride  0.9% NS 500ml IV: Administer if needed for hypovolemic blood pressure drop or as ordered by physician after call to physician with anaphylactic reaction   Call MD / Call 911   Complete by: As  directed    If you experience chest pain or shortness of breath, CALL 911 and be transported to the hospital emergency room.  If you develope a fever above 101 F, pus (white drainage) or increased drainage or redness at the wound, or calf pain, call your surgeon's office.   Call MD / Call 911   Complete by: As directed    If you experience chest pain or shortness of breath, CALL 911 and be transported to the hospital emergency room.  If you develope a fever above 101 F, pus (white drainage) or increased drainage or redness at the wound, or calf pain, call your surgeon's office.   Change dressing on IV access line weekly and PRN   Complete by: As directed    Constipation Prevention   Complete by: As directed    Drink plenty of fluids.  Prune juice may be helpful.  You may use a stool softener, such as Colace (over the counter) 100 mg twice a day.  Use MiraLax  (over the counter) for constipation as needed.   Constipation Prevention   Complete by: As directed    Drink plenty of fluids.  Prune juice may be helpful.  You may use a stool softener, such as Colace (over the counter) 100 mg twice a day.  Use MiraLax  (over the counter) for constipation as needed.   Flush IV access with Sodium Chloride  0.9% and Heparin  10 units/ml or 100 units/ml   Complete by: As directed    Home infusion instructions - [facility]  Complete by: As directed    Instructions: Flush IV access with Sodium Chloride  0.9% and Heparin  10units/ml or 100units/ml   Change dressing on IV access line: Weekly and PRN   Instructions Cath Flo 2mg : Administer for PICC Line occlusion and as ordered by physician for other access device   [facility] pharmacist to adjust dose for: Vancomycin , Aminoglycosides and other anti-infective therapies as requested by physician   Increase activity slowly as tolerated   Complete by: As directed    Increase activity slowly as tolerated   Complete by: As directed    Method of  administration may be changed at the discretion of home infusion pharmacist based upon assessment of the patient and/or caregivers ability to self-administer the medication ordered   Complete by: As directed    Post-operative opioid taper instructions:   Complete by: As directed    POST-OPERATIVE OPIOID TAPER INSTRUCTIONS: It is important to wean off of your opioid medication as soon as possible. If you do not need pain medication after your surgery it is ok to stop day one. Opioids include: Codeine, Hydrocodone (Norco, Vicodin), Oxycodone (Percocet, oxycontin ) and hydromorphone  amongst others.  Long term and even short term use of opiods can cause: Increased pain response Dependence Constipation Depression Respiratory depression And more.  Withdrawal symptoms can include Flu like symptoms Nausea, vomiting And more Techniques to manage these symptoms Hydrate well Eat regular healthy meals Stay active Use relaxation techniques(deep breathing, meditating, yoga) Do Not substitute Alcohol to help with tapering If you have been on opioids for less than two weeks and do not have pain than it is ok to stop all together.  Plan to wean off of opioids This plan should start within one week post op of your joint replacement. Maintain the same interval or time between taking each dose and first decrease the dose.  Cut the total daily intake of opioids by one tablet each day Next start to increase the time between doses. The last dose that should be eliminated is the evening dose.      Post-operative opioid taper instructions:   Complete by: As directed    POST-OPERATIVE OPIOID TAPER INSTRUCTIONS: It is important to wean off of your opioid medication as soon as possible. If you do not need pain medication after your surgery it is ok to stop day one. Opioids include: Codeine, Hydrocodone (Norco, Vicodin), Oxycodone (Percocet, oxycontin ) and hydromorphone  amongst others.  Long term and even  short term use of opiods can cause: Increased pain response Dependence Constipation Depression Respiratory depression And more.  Withdrawal symptoms can include Flu like symptoms Nausea, vomiting And more Techniques to manage these symptoms Hydrate well Eat regular healthy meals Stay active Use relaxation techniques(deep breathing, meditating, yoga) Do Not substitute Alcohol to help with tapering If you have been on opioids for less than two weeks and do not have pain than it is ok to stop all together.  Plan to wean off of opioids This plan should start within one week post op of your joint replacement. Maintain the same interval or time between taking each dose and first decrease the dose.  Cut the total daily intake of opioids by one tablet each day Next start to increase the time between doses. The last dose that should be eliminated is the evening dose.           Contact information for follow-up providers     Harden Jerona GAILS, MD Follow up in 1 week(s).   Specialty: Orthopedic Surgery Contact  information: [address] [place]  [place] [address] [phone]         Alvia Corean CROME, FNP Follow up.   Specialty: Family Medicine Contact information: [address] [place] [address] [phone]         Dennise Kingsley, MD Follow up.   Specialty: Infectious Diseases Why: 06/27/23 at 10:30 am. Please call to reschedule if you are not able to make this appointment. Contact information: [address] [place] [address] [phone]              Contact information for after-discharge care     Home Medical Care     [facility]) .   Service: Home Health Services Contact information: [address] [place]  [address] [phone]                      Signed: Jerona LULLA Sage 05/30/2024, 8:11 AM  "

## 2024-06-02 ENCOUNTER — Telehealth: Payer: Self-pay | Admitting: *Deleted

## 2024-06-02 DIAGNOSIS — I890 Lymphedema, not elsewhere classified: Secondary | ICD-10-CM | POA: Diagnosis not present

## 2024-06-02 NOTE — Transitions of Care (Post Inpatient/ED Visit) (Signed)
" ° °  06/02/2024  Name: GARIN MATA MRN: [id number] DOB: 09/28/1940  Today's TOC FU Call Status: Today's TOC FU Call Status:: Unsuccessful Call (1st Attempt) Unsuccessful Call (1st Attempt) Date: 06/02/24  Attempted to reach the patient regarding the most recent Inpatient visit.  Left HIPAA compliant voice message   Follow Up Plan: Additional outreach attempts will be made to reach the patient to complete the Transitions of Care (Post Inpatient/ED visit) call.   Pls call/ message for questions,  Rayven Rettig Mckinney Yamilex Borgwardt, RN, BSN, CCRN Alumnus RN Care Manager  Transitions of Care  [facility] - [facility] [phone]: direct office  "

## 2024-06-03 ENCOUNTER — Ambulatory Visit (INDEPENDENT_AMBULATORY_CARE_PROVIDER_SITE_OTHER)

## 2024-06-03 ENCOUNTER — Telehealth: Payer: Self-pay

## 2024-06-03 NOTE — Transitions of Care (Post Inpatient/ED Visit) (Signed)
" ° °  06/03/2024  Name: [name] Fernandez MRN: [id number] DOB: November 26, 1940  Today's TOC FU Call Status: Today's TOC FU Call Status:: Unsuccessful Call (2nd Attempt) Unsuccessful Call (2nd Attempt) Date: 06/03/24  Attempted to reach the patient regarding the most recent Inpatient/ED visit.  Left a HIPAA approved voicemail message to phone number provided in demographics per DPR.    Follow Up Plan: Additional outreach attempts will be made to reach the patient to complete the Transitions of Care (Post Inpatient/ED visit) call.   Richerd Fish, RN, BSN, CCM [facility], [facility] Management Coordinator Direct Dial: [phone]        "

## 2024-06-03 NOTE — Progress Notes (Signed)
 Patient is s/p serial left ankle debridement on 05/23/2024 and 05/28/2024. He came in today for a dressing change. The wound vac is intact. Peel and stick dressing removed and there is a large Kerecis sheet that has not incorporated. Photo was obtained. Left the graft intact and applied an adaptic and advised pt to leave the adaptic in place until return appt next Tuesday. They will remove the 4x4 and apply Vashe dressing daily. Patient will continue non weight bearing and elevation higher than his heart and follow up in the office next Tuesday. Patient and his wife voiced understanding and will call with any questions or concerns.    Keona Sheffler, RMA, CWCA

## 2024-06-09 ENCOUNTER — Telehealth: Payer: Self-pay | Admitting: *Deleted

## 2024-06-09 NOTE — Transitions of Care (Post Inpatient/ED Visit) (Signed)
 "  06/09/2024  Name: [name] Fernandez MRN: [id number] DOB: 21-Nov-1940  Today's TOC FU Call Status: Today's TOC FU Call Status:: Successful TOC FU Call Completed TOC FU Call Complete Date: 06/09/24  Patient's Name and Date of Birth confirmed. Name, DOB  Transition Care Management Follow-up Telephone Call Date of Discharge: 05/30/24 Discharge Facility: [facility]) Type of Discharge: Inpatient Admission Primary Inpatient Discharge Diagnosis:: chronic infected (L) ankle wound; surgical I/D with home IV antibiotic infusion How have you been since you were released from the hospital?: Better (I am slowly getting better and the wound is looking better; not having any issues with changing the dressings and my wife is doing a great job with the IV antibiotics.  The nurses are coming weekly.  I don't need any calls to follow up- doing fine now) Any questions or concerns?: No  Items Reviewed: Did you receive and understand the discharge instructions provided?: Yes (thoroughly reviewed with patient who verbalizes good understanding of same) Medications obtained,verified, and reconciled?: Yes (Medications Reviewed) (Full medication reconciliation/ review completed; no concerns or discrepancies identified; confirmed patient obtained/ is taking all newly Rx'd medications as instructed; self-manages medications and denies questions/ concerns around medications today) Any new allergies since your discharge?: No Dietary orders reviewed?: Yes Type of Diet Ordered:: Regular Do you have support at home?: Yes People in Home [RPT]: spouse Name of Support/Comfort Primary Source: Reports independent in self-care activities; resides with supportive spouse-- assists as/ if needed/ indicated  Medications Reviewed Today: Medications Reviewed Today     Reviewed by Toba Claudio M, RN (Registered Nurse) on 06/09/24 at 1359  Med List Status: <None>   Medication Order Taking? Sig Documenting Provider Last Dose  Status Informant  amoxicillin  (AMOXIL ) 500 MG capsule [id number] Yes Take 2 capsules (1,000 mg total) by mouth 3 (three) times daily for 26 days. Dennise Kingsley, MD  Active   Ascorbic Acid  (VITAMIN C ) 1000 MG tablet [id number] Yes Take 1,000 mg by mouth in the morning. [provider]  Active Self  betamethasone  dipropionate 0.05 % cream [id number] Yes Apply 1 Application topically 2 (two) times daily as needed (skin rash/irritation.). [provider]  Active Self  diphenhydrAMINE  (BENADRYL ) 25 MG tablet [id number] Yes Take 25 mg by mouth in the morning and at bedtime. [provider]  Active Self  doxepin  (SINEQUAN ) 10 MG capsule [id number] Yes Take 10-20 mg by mouth at bedtime as needed (itching). [provider]  Active Self  hydrOXYzine  (ATARAX ) 25 MG tablet [id number] Yes TAKE 1 TABLET 3 x / day  AS NEEDED FOR ANXIETY OR SLEEP                                                             /                                                                   TAKE  BY                                                 MOUTH Tonita Fallow, MD  Active Self  MAGNESIUM  PO [id number] Yes Take 2 capsules by mouth at bedtime. [provider]  Active Self  MELATONIN PO [id number] Yes Take 10 mg by mouth at bedtime. [provider]  Active Self  meropenem  (MERREM ) IVPB [id number] Yes Inject 2 g into the vein every 12 (twelve) hours for 27 days. Indication:  Psuedomonas ankle wound First Dose: Yes Last Day of Therapy:  06/25/24 Labs - Once weekly:  CBC/D and BMP, Labs - Once weekly: ESR and CRP Method of administration: Mini-Bag Plus / Gravity Method of administration may be changed at the discretion of home infusion pharmacist based upon assessment of the patient and/or caregiver's ability to self-administer the medication ordered. Harden Jerona GAILS, MD  Active   omeprazole  (PRILOSEC  OTC) 20 MG tablet [id number] Yes Take 20 mg by  mouth daily before breakfast. [provider]  Active Self           Med Note CHRISTIE ALEXANDER   Thu Nov 09, 2022  9:27 AM)    Oxycodone  HCl 10 MG TABS [id number] Yes Take 1 tablet (10 mg total) by mouth 3 (three) times daily as needed. Harden Jerona GAILS, MD  Active   torsemide  (DEMADEX ) 20 MG tablet [id number] Yes Take 2 tablets (40 mg total) by mouth daily as needed (For worsening swelling or more than 2 pound weight gain from your baseline.). Take 2 tablet  Daily for Fluid Retention / Leg Swelling Raenelle Donalda HERO, MD  Active Self  triamcinolone  cream (KENALOG ) 0.1 % [id number] Yes APPLY TO THE LEFT LEG UNDER THE WRAP DAILY WHERE THERE IS ITCHING OR RASH X 2 WEEKS OR UNTIL HEALED Harden Jerona GAILS, MD  Active Self  Wound Cleansers (VASHE WOUND EX) [id number] Yes Apply 1 Application topically daily. ANKLE WOUND [provider]  Active Self           Home Care and Equipment/Supplies: Were Home Health Services Ordered?: Yes Name of Home Health Agency:: [facility] RN Has Agency set up a time to come to your home?: Yes First Home Health Visit Date: 05/31/24 Any new equipment or medical supplies ordered?: Yes Name of Medical supply agency?: [facility]: IV equipment Were you able to get the equipment/medical supplies?: Yes Do you have any questions related to the use of the equipment/supplies?: No  Functional Questionnaire: Do you need assistance with bathing/showering or dressing?: Yes (spouse supervises/ assists as indicated- needed) Do you need assistance with meal preparation?: No (spouse supervises/ assists as indicated- needed/ prepares most meals) Do you need assistance with eating?: No Do you have difficulty maintaining continence: No Do you need assistance with getting out of bed/getting out of a chair/moving?: No Do you have difficulty managing or taking your medications?: No  Follow up appointments reviewed: PCP Follow-up appointment confirmed?:  No (Patient declined scheduling Hospital follow up with PCP by [facility] RN CM in real-time) MD Provider Line Number:[phone] Given: No (verified well-established with current PCP) Specialist Hospital Follow-up appointment confirmed?: Yes Date of Specialist follow-up appointment?: 06/03/24 (verified attended as scheduled) Follow-Up Specialty Provider:: orthopedic surgical provider- goes back again 06/10/24 Do you need transportation to your follow-up appointment?: No Do you understand  care options if your condition(s) worsen?: Yes-patient verbalized understanding  SDOH Interventions Today    Flowsheet Row Most Recent Value  SDOH Interventions   Food Insecurity Interventions Intervention Not Indicated  Housing Interventions Intervention Not Indicated  Transportation Interventions Intervention Not Indicated  [drives self at baseline,  spouse assisting post- recent surgery]  Utilities Interventions Intervention Not Indicated   See TOC assessment tabs for additional assessment/ TOC intervention information Reinforced signs/ symptoms wound/ PICC line infection along with corresponding action plan  Reinforced instructions around daily dressing changes Provided education/ reinforcement around common side effects of narcotic pain medicine; need to use pain medicine as prescribed/ as needed; safe use of opioid medication; strategies to prevent constipation  Patient declines need for ongoing/ further care management/ coordination outreach; declines enrollment in 30-day TOC program- declines taking my direct phone number should needs/ concerns arise post-TOC call   Pls call/ message for questions,  Jahmal Dunavant Mckinney Kaliah Haddaway, RN, BSN, CCRN Alumnus RN Care Manager  Transitions of Care  [facility]  [place] [phone]: direct office  "

## 2024-06-10 ENCOUNTER — Ambulatory Visit (INDEPENDENT_AMBULATORY_CARE_PROVIDER_SITE_OTHER): Admitting: Orthopedic Surgery

## 2024-06-10 DIAGNOSIS — L97322 Non-pressure chronic ulcer of left ankle with fat layer exposed: Secondary | ICD-10-CM

## 2024-06-10 DIAGNOSIS — I890 Lymphedema, not elsewhere classified: Secondary | ICD-10-CM

## 2024-06-10 DIAGNOSIS — Z945 Skin transplant status: Secondary | ICD-10-CM

## 2024-06-10 DIAGNOSIS — S91002A Unspecified open wound, left ankle, initial encounter: Secondary | ICD-10-CM

## 2024-06-11 ENCOUNTER — Encounter: Payer: Self-pay | Admitting: Orthopedic Surgery

## 2024-06-11 NOTE — Progress Notes (Addendum)
 "  Office Visit Note   Patient: [name] Fernandez           Date of Birth: 1940-09-20           MRN: [id number] Visit Date: 06/10/2024              Requested by: Alvia Corean CROME, FNP [address] [place],  [place] [address] PCP: Alvia Corean CROME, FNP  Chief Complaint  Patient presents with   Other    Follow up wound      HPI: Discussed the use of AI scribe software for clinical note transcription with the patient, who gave verbal consent to proceed.  History of Present Illness [name] Fernandez is an 83 year old male with recent right shoulder arthroplasty who presents for postoperative wound infection management.  Five weeks ago, he underwent right shoulder arthroplasty and has since required ongoing wound care for a postoperative infection, including daily VASHE dressing changes. He notes the wound now appears cleaner, and he has regained sensation in the area. He is afebrile and denies pain at the surgical site, having discontinued all pain medications two days ago.  He remains on intravenous antibiotics via PICC line. Following hospital discharge, an additional bacterial organism was identified, and he was started on oral amoxicillin  1000 mg three times daily. He began taking probiotics today as previously recommended. He reports no current systemic symptoms of infection.  He developed blistering around the right great toe. He has been wrapping his legs, which reduced swelling, but fluid accumulation in the foot led to skin blistering and local irritation. He elevates his foot as able.  He has lumbar radiculopathy with intermittent back pain, which has improved. He occasionally uses Aleve for back discomfort. He experienced significant constipation while on pain medication, but bowel function has normalized after discontinuation of narcotics. He tolerated recent holiday travel without injury or exacerbation of symptoms.     Assessment & Plan: Visit Diagnoses:  No diagnosis found.  Plan: Assessment and Plan Assessment & Plan Postoperative shoulder wound infection Five weeks post right shoulder arthroplasty with improved wound cleanliness and sensation. Continued PICC line antibiotics and oral amoxicillin  for additional bacterial organism. No analgesics needed. Reapplication for keratosis biologic tissue graft planned. - Continued daily VASHE dressing changes. - Continued PICC line antibiotics. - Continued oral amoxicillin  1000 mg three times daily. - Continued use of probiotics. - Scheduled follow-up in two weeks.  Lower extremity edema with blistering secondary to postoperative swelling Lower extremity edema with blistering due to postoperative fluid accumulation. Edema improved with leg wrapping and elevation. Blisters monitored. - Continued elevation of lower extremities. - Continued wrapping of legs as tolerated. - Scheduled follow-up in two weeks.      Follow-Up Instructions: No follow-ups on file.   Ortho Exam  Patient is alert, oriented, no adenopathy, well-dressed, normal affect, normal respiratory effort. Physical Exam SKIN: Wound flattened with granulation tissue. Blistering over dorsum of great toes due to swelling. Wound measures 7 x 12 cm with flat granulation tissue.    Imaging: No results found.   Labs: Lab Results  Component Value Date   HGBA1C 6.2 (H) 09/22/2023   HGBA1C 6.5 (H) 04/10/2023   HGBA1C 6.3 (H) 10/05/2022   ESRSEDRATE 34 (H) 01/15/2023   ESRSEDRATE 32 (H) 06/15/2022   ESRSEDRATE 40 (H) 06/14/2022   CRP 0.7 01/15/2023   CRP 0.8 06/15/2022   CRP <0.5 06/14/2022   LABURIC 7.8 05/20/2014   LABURIC 6.8 05/20/2013  REPTSTATUS 05/30/2024 FINAL 05/23/2024   GRAMSTAIN  05/23/2024    NO WBC SEEN ABUNDANT GRAM NEGATIVE RODS Performed at [facility], [address] [place], [place] [address]    CULT  05/23/2024    ABUNDANT PSEUDOMONAS AERUGINOSA NO ANAEROBES ISOLATED FEW ENTEROCOCCUS  FAECALIS    LABORGA PSEUDOMONAS AERUGINOSA 05/23/2024   LABORGA ENTEROCOCCUS FAECALIS 05/23/2024     Lab Results  Component Value Date   ALBUMIN 2.9 (L) 05/23/2024   ALBUMIN 3.9 09/21/2023   ALBUMIN 2.9 (L) 06/16/2022   PREALBUMIN 18 06/14/2022    Lab Results  Component Value Date   MG 1.8 04/10/2023   MG 1.8 06/19/2022   MG 1.9 04/04/2022   Lab Results  Component Value Date   VD25OH 45 04/10/2023   VD25OH 57 10/05/2022   VD25OH 59 04/04/2022    Lab Results  Component Value Date   PREALBUMIN 18 06/14/2022      Latest Ref Rng & Units 05/23/2024   12:12 PM 12/06/2023    2:24 PM 09/21/2023    1:24 PM  CBC EXTENDED  WBC 4.0 - 10.5 K/uL 8.4  7.6  8.3   RBC 4.22 - 5.81 MIL/uL 3.46  3.54  3.84   Hemoglobin 13.0 - 17.0 g/dL 88.8  89.3  88.8   HCT 39.0 - 52.0 % 32.8  33.3  33.4   Platelets 150 - 400 K/uL 495  541  451   NEUT# 1.7 - 7.7 K/uL 5.8     Lymph# 0.7 - 4.0 K/uL 1.4        There is no height or weight on file to calculate BMI.  Orders:  No orders of the defined types were placed in this encounter.  No orders of the defined types were placed in this encounter.    Procedures: No procedures performed  Clinical Data: No additional findings.  ROS:  All other systems negative, except as noted in the HPI. Review of Systems  Objective: Vital Signs: There were no vitals taken for this visit.  Specialty Comments:  No specialty comments available.  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/2022   Osteomyelitis (HCC) 06/16/2022   Tenosynovitis of tibialis posterior tendon 06/15/2022   Wound infection 06/15/2022   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   Anemia 03/13/2019   Chronic venous insufficiency 03/12/2019   CKD (chronic kidney disease) stage 2, 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/2015   COPD (chronic obstructive pulmonary disease) (HCC) 12/08/2015   Chronic pain syndrome 05/26/2015   DDD (degenerative disc disease), lumbar 05/26/2015   Medication management 05/26/2015   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   Lumbago with sciatica 08/06/2014   Hyperlipidemia 08/20/2013   Essential hypertension    Abnormal glucose    Vitamin D  deficiency    Arthritis    Vitamin B12 deficiency    Celiac disease 06/19/2012   GERD (gastroesophageal reflux disease) 04/30/2012   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 tube) procedure 04/30/2012   Hypothyroidism 04/30/2012   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  Past Medical History:  Diagnosis Date   Anemia    Celiac disease    diagnoses 06/24/12   Chronic ulcer of ankle (HCC)    left   COPD (chronic obstructive pulmonary disease) (HCC)    Esophageal stricture    GERD (gastroesophageal reflux disease)    Hiatal hernia    Hypertension    patient states  no current HTN   Hypothyroidism    Pre-diabetes    Prediabetes    Shortness of breath    from oxycodone  at times   Vitamin B12 deficiency    Vitamin D  deficiency     Family History  Problem Relation Age of Onset   Esophageal cancer Father    Cancer Father    Early death Mother     Past Surgical History:  Procedure Laterality Date   APPENDECTOMY     APPLICATION OF WOUND VAC Left 01/12/2023   Procedure: APPLICATION OF WOUND VAC;  Surgeon: Harden Jerona GAILS, MD;  Location: [facility] OR;  Service: Orthopedics;  Laterality: Left;   BACK SURGERY     3 diff surgeries for vertebrea broken   CYSTOSCOPY W/ RETROGRADES Bilateral 12/11/2023   Procedure: CYSTOSCOPY, WITH  RETROGRADE PYELOGRAM;  Surgeon: Shane Steffan BROCKS, MD;  Location: [facility] ORS;  Service: Urology;  Laterality: Bilateral;   EYE SURGERY     bilateral cataract surgery   HERNIA REPAIR  02/10/1949   bilateral groin as child   HIATAL HERNIA REPAIR     I & D EXTREMITY Left 06/16/2022   Procedure: IRRIGATION AND DEBRIDEMENT ANKLE;  Surgeon: Harden Jerona GAILS, MD;  Location: [facility] OR;  Service: Orthopedics;  Laterality: Left;   I & D EXTREMITY Left 07/21/2022   Procedure: LEFT ANKLE DEBRIDEMENT;  Surgeon: Harden Jerona GAILS, MD;  Location: [facility] OR;  Service: Orthopedics;  Laterality: Left;   I & D EXTREMITY Left 11/10/2022   Procedure: LEFT ANKLE DEBRIDEMENT;  Surgeon: Harden Jerona GAILS, MD;  Location[facility] OR;  Service: Orthopedics;  Laterality: Left;   I & D EXTREMITY Left 01/12/2023   Procedure: LEFT ANKLE DEBRIDEMENT;  Surgeon: Harden Jerona GAILS, MD;  Location: [facility] OR;  Service: Orthopedics;  Laterality: Left;   INCISION AND DRAINAGE OF DEEP ABSCESS, ANKLE Left 05/23/2024   Procedure: INCISION AND DRAINAGE OF DEEP ABSCESS, ANKLE;  Surgeon: Harden Jerona GAILS, MD;  Location: [facility] OR;  Service: Orthopedics;  Laterality: Left;  LEFT ANKLE IRRIGATION AND DEBRIDEMENT   INCISION AND DRAINAGE OF DEEP ABSCESS, ANKLE Left 05/28/2024   Procedure: INCISION AND DRAINAGE OF LEFT ANKLE;  Surgeon: Harden Jerona GAILS, MD;  Location: [facility] OR;  Service: Orthopedics;  Laterality: Left;   JOINT REPLACEMENT  04/12/2010   right knee replacement   TONSILLECTOMY     as child   TOTAL KNEE REVISION  11/20/2011   Procedure: TOTAL KNEE REVISION;  Surgeon: Donnice JONETTA Car, MD;  Location: [facility] ORS;  Service: Orthopedics;  Laterality: Right;  Right Total Knee Revision   Social History   Occupational History   Occupation: Retired    Associate Professor: [employer]  Tobacco Use   Smoking status: Former    Current packs/day: 0.00    Average packs/day: 1.2 packs/day for 31.7 years (37.6 ttl pk-yrs)    Types: Cigarettes    Start date: 06/27/1960    Quit date:  06/27/1980    Years since quitting: 43.9    Passive exposure: Past   Smokeless tobacco: Never  Vaping Use   Vaping status: Never Used  Substance and Sexual Activity   Alcohol use: No   Drug use: No   Sexual activity: Not Currently         "

## 2024-06-16 ENCOUNTER — Telehealth: Payer: Self-pay

## 2024-06-16 NOTE — Telephone Encounter (Signed)
-   his pseudomonas is resistant. The other abx option is avucaz which is every 8 hours. Hold abx if he has new rash allover. Needs to go to ed for evaluation.

## 2024-06-16 NOTE — Telephone Encounter (Signed)
 Spoke with who states he has not been on Toremide for two weeks.  Would like to know if he can decrease antbx frequency.  Also pt did want to clarify he does have rash. Feels worse when he gets warm. Rash is all over. Will try to send picture on mychart If not able to make changes in antbx would like to know if he can have something called into pharmacy to help with itching.  Lorenda CHRISTELLA Code, RMA

## 2024-06-16 NOTE — Telephone Encounter (Signed)
 Would recommend reaching out  to pcp as pt on torsemide . I think given resistant bacteria we have limited antibiotic options. He is on merrem  and amoxicillin  till 06/25/24.

## 2024-06-16 NOTE — Telephone Encounter (Signed)
 Patient wife called with some concerns- c/o itching and swelling in leg. Swelling is new and has been going on x2 days. Has tried taking zyrtec/ benadryl  for itching, but this has not improved. Denies any SOB or rash at this time.  Would like to know if they can decrease antibiotics or change to alternative.

## 2024-06-16 NOTE — Telephone Encounter (Signed)
 Left voicemail the message above.

## 2024-06-16 NOTE — Telephone Encounter (Signed)
 Spoke with patient regarding provides recommendation. Pt understands to stop antbx and go to ED. Would prefer to wait until he is seen by [facility] Rn and surgeon tomorrow. Reiterated that MD is recommending ED visit.  Lorenda CHRISTELLA Code, RMA

## 2024-06-16 NOTE — Telephone Encounter (Signed)
-   his pseudomonas is resistant. The other abx option is avycaz which is every 8 hours. Hold abx if he has new rash allover. Needs to go to ed for evaluation.

## 2024-06-17 ENCOUNTER — Other Ambulatory Visit (HOSPITAL_BASED_OUTPATIENT_CLINIC_OR_DEPARTMENT_OTHER): Payer: Self-pay

## 2024-06-17 ENCOUNTER — Encounter: Payer: Self-pay | Admitting: Internal Medicine

## 2024-06-17 ENCOUNTER — Emergency Department (HOSPITAL_BASED_OUTPATIENT_CLINIC_OR_DEPARTMENT_OTHER)
Admission: EM | Admit: 2024-06-17 | Discharge: 2024-06-17 | Disposition: A | Discharge status: Home / Self Care | Attending: Emergency Medicine | Admitting: Emergency Medicine

## 2024-06-17 ENCOUNTER — Encounter (HOSPITAL_BASED_OUTPATIENT_CLINIC_OR_DEPARTMENT_OTHER): Payer: Self-pay

## 2024-06-17 DIAGNOSIS — Z91040 Latex allergy status: Secondary | ICD-10-CM | POA: Insufficient documentation

## 2024-06-17 DIAGNOSIS — I10 Essential (primary) hypertension: Secondary | ICD-10-CM | POA: Diagnosis not present

## 2024-06-17 DIAGNOSIS — R21 Rash and other nonspecific skin eruption: Secondary | ICD-10-CM | POA: Insufficient documentation

## 2024-06-17 LAB — COMPREHENSIVE METABOLIC PANEL WITH GFR
ALT: 32 U/L (ref 0–44)
AST: 50 U/L — ABNORMAL HIGH (ref 15–41)
Albumin: 3.5 g/dL (ref 3.5–5.0)
Alkaline Phosphatase: 163 U/L — ABNORMAL HIGH (ref 38–126)
Anion gap: 8 (ref 5–15)
BUN: 24 mg/dL — ABNORMAL HIGH (ref 8–23)
CO2: 26 mmol/L (ref 22–32)
Calcium: 9.5 mg/dL (ref 8.9–10.3)
Chloride: 102 mmol/L (ref 98–111)
Creatinine, Ser: 0.95 mg/dL (ref 0.61–1.24)
GFR, Estimated: >60 mL/min
Glucose, Bld: 98 mg/dL (ref 70–99)
Potassium: 4.9 mmol/L (ref 3.5–5.1)
Sodium: 136 mmol/L (ref 135–145)
Total Bilirubin: 0.8 mg/dL (ref 0.0–1.2)
Total Protein: 8.2 g/dL — ABNORMAL HIGH (ref 6.5–8.1)

## 2024-06-17 LAB — CBC WITH DIFFERENTIAL/PLATELET
Abs Immature Granulocytes: 0.02 K/uL (ref 0.00–0.07)
Basophils Absolute: 0.1 K/uL (ref 0.0–0.1)
Basophils Relative: 1 %
Eosinophils Absolute: 3.6 K/uL — ABNORMAL HIGH (ref 0.0–0.5)
Eosinophils Relative: 34 %
HCT: 30 % — ABNORMAL LOW (ref 39.0–52.0)
Hemoglobin: 10.1 g/dL — ABNORMAL LOW (ref 13.0–17.0)
Immature Granulocytes: 0 %
Lymphocytes Relative: 20 %
Lymphs Abs: 2.1 K/uL (ref 0.7–4.0)
MCH: 31.4 pg (ref 26.0–34.0)
MCHC: 33.7 g/dL (ref 30.0–36.0)
MCV: 93.2 fL (ref 80.0–100.0)
Monocytes Absolute: 0.9 K/uL (ref 0.1–1.0)
Monocytes Relative: 8 %
Neutro Abs: 3.9 K/uL (ref 1.7–7.7)
Neutrophils Relative %: 37 %
Platelets: 306 K/uL (ref 150–400)
RBC: 3.22 MIL/uL — ABNORMAL LOW (ref 4.22–5.81)
RDW: 16.2 % — ABNORMAL HIGH (ref 11.5–15.5)
WBC: 10.6 K/uL — ABNORMAL HIGH (ref 4.0–10.5)
nRBC: 0 % (ref 0.0–0.2)

## 2024-06-17 MED ORDER — HYDROXYZINE HCL 25 MG PO TABS: 25.0000 mg | ORAL_TABLET | Freq: Every day | ORAL | 0 refills | Status: AC

## 2024-06-17 MED ORDER — METHOCARBAMOL 500 MG PO TABS: 500.0000 mg | ORAL_TABLET | Freq: Two times a day (BID) | ORAL | 0 refills | Status: AC

## 2024-06-17 MED ORDER — HYDROXYZINE HCL 10 MG PO TABS
10.0000 mg | ORAL_TABLET | Freq: Once | ORAL | Status: AC
  Administered 2024-06-17: 10 mg via ORAL
  Filled 2024-06-17: qty 1

## 2024-06-17 NOTE — ED Triage Notes (Signed)
 Pt c/o blistering rash x approx 1wk, BLE swelling above the knee for about a week.  PICC line w merepenem d/c yesterday afternoon  Follow up appt w ID 1/14.

## 2024-06-17 NOTE — Telephone Encounter (Signed)
 Ron called, states he will go to [facility] ED.   Winola Drum, BSN, RN

## 2024-06-17 NOTE — Telephone Encounter (Signed)
 I called pt and recommended going to ED as he notes blistering rash. He is amenable to going to ED at [facility]. He notes he will keep us  updated. Holding abx

## 2024-06-17 NOTE — Telephone Encounter (Signed)
 Called pt and he notes, he is aware of ED recommendation. Conveyed as previously discussed: Holding abx  no plans at this time to add abx unless assessed as pt reports blistering rash is throughout body. Noted that blistering rash can be life threatening. I will add pharmacy as well.

## 2024-06-17 NOTE — ED Provider Notes (Signed)
 " [place] EMERGENCY DEPARTMENT AT [facility] Provider Note   CSN: [id number] Arrival date & time: 06/17/24  1448     Patient presents with: Rash   [name] Fernandez is a 84 y.o. male.   84 y.o male with a PMH of HTN, GERD, Hiatal presents to the ED with a chief complaint of rash which has been ongoing for about a month. Patient reports he started meropenem  on 05/20/2024 reports that he developed a rash after this medicine, however over the last couple days he has developed some blisters around his entire body.  He did call the office who recommended he be evaluated in the emergency department due to blistering rash .  Patient's not having any systemic symptoms such as nausea, vomiting, diarrhea, shortness of breath or other complaints.  The history is provided by the patient.  Rash Associated symptoms: no abdominal pain, no diarrhea, no fever, no nausea, no shortness of breath and not vomiting        Prior to Admission medications  Medication Sig Start Date End Date Taking? Authorizing Provider  hydrOXYzine  (ATARAX ) 25 MG tablet Take 1 tablet (25 mg total) by mouth daily for 7 days. 06/17/24 06/24/24 Yes Memphis Decoteau, PA-C  amoxicillin  (AMOXIL ) 500 MG capsule Take 2 capsules (1,000 mg total) by mouth 3 (three) times daily for 26 days. 05/30/24 06/25/24  Dennise Kingsley, MD  Ascorbic Acid  (VITAMIN C ) 1000 MG tablet Take 1,000 mg by mouth in the morning.    [provider]  betamethasone  dipropionate 0.05 % cream Apply 1 Application topically 2 (two) times daily as needed (skin rash/irritation.). 07/26/23   [provider]  diphenhydrAMINE  (BENADRYL ) 25 MG tablet Take 25 mg by mouth in the morning and at bedtime.    [provider]  doxepin  (SINEQUAN ) 10 MG capsule Take 10-20 mg by mouth at bedtime as needed (itching). 07/28/23   [provider]  MAGNESIUM  PO Take 2 capsules by mouth at bedtime.    [provider]  MELATONIN PO Take 10 mg by  mouth at bedtime.    [provider]  meropenem  (MERREM ) IVPB Inject 2 g into the vein every 12 (twelve) hours for 27 days. Indication:  Psuedomonas ankle wound First Dose: Yes Last Day of Therapy:  06/25/24 Labs - Once weekly:  CBC/D and BMP, Labs - Once weekly: ESR and CRP Method of administration: Mini-Bag Plus / Gravity Method of administration may be changed at the discretion of home infusion pharmacist based upon assessment of the patient and/or caregiver's ability to self-administer the medication ordered. 05/29/24 06/25/24  Harden Jerona GAILS, MD  methocarbamol  (ROBAXIN ) 500 MG tablet Take 1 tablet (500 mg total) by mouth 2 (two) times daily for 7 days. 06/17/24 06/24/24 Yes Deshawn Witty, PA-C  omeprazole  (PRILOSEC  OTC) 20 MG tablet Take 20 mg by mouth daily before breakfast.    [provider]  Oxycodone  HCl 10 MG TABS Take 1 tablet (10 mg total) by mouth 3 (three) times daily as needed. 05/29/24   Harden Jerona GAILS, MD  torsemide  (DEMADEX ) 20 MG tablet Take 2 tablets (40 mg total) by mouth daily as needed (For worsening swelling or more than 2 pound weight gain from your baseline.). Take 2 tablet  Daily for Fluid Retention / Leg Swelling 09/23/23   Ghimire, Donalda HERO, MD  triamcinolone  cream (KENALOG ) 0.1 % APPLY TO THE LEFT LEG UNDER THE WRAP DAILY WHERE THERE IS ITCHING OR RASH X 2 WEEKS OR UNTIL HEALED 07/26/23  Harden Jerona GAILS, MD  Wound Cleansers (VASHE WOUND EX) Apply 1 Application topically daily. ANKLE WOUND    [provider]    Allergies: Feraheme  [ferumoxytol ], Latex, Sulfa  antibiotics, Wound dressing adhesive, Levothyroxine , and Doxycycline     Review of Systems  Constitutional:  Negative for chills and fever.  Respiratory:  Negative for shortness of breath.   Cardiovascular:  Negative for chest pain.  Gastrointestinal:  Negative for abdominal pain, diarrhea, nausea and vomiting.  Musculoskeletal:  Negative for back pain.  Skin:  Positive for rash.  All  other systems reviewed and are negative.   Updated Vital Signs BP (!) 151/92 (BP Location: Right Arm)   Pulse 89   Temp 97.7 F (36.5 C) (Oral)   Resp 16   SpO2 100%   Physical Exam Vitals and nursing note reviewed.  Constitutional:      Appearance: Normal appearance.  HENT:     Head: Normocephalic and atraumatic.     Nose: Nose normal.     Mouth/Throat:     Mouth: Mucous membranes are moist.     Comments: No mucosal involvement  Cardiovascular:     Rate and Rhythm: Normal rate.  Pulmonary:     Effort: Pulmonary effort is normal.  Abdominal:     General: Abdomen is flat.  Musculoskeletal:     Cervical back: Normal range of motion and neck supple.  Skin:    General: Skin is warm and dry.  Neurological:     Mental Status: He is alert and oriented to person, place, and time.        (all labs ordered are listed, but only abnormal results are displayed) Labs Reviewed  COMPREHENSIVE METABOLIC PANEL WITH GFR - Abnormal; Notable for the following components:      Result Value   BUN 24 (*)    Total Protein 8.2 (*)    AST 50 (*)    Alkaline Phosphatase 163 (*)    All other components within normal limits  CBC WITH DIFFERENTIAL/PLATELET - Abnormal; Notable for the following components:   WBC 10.6 (*)    RBC 3.22 (*)    Hemoglobin 10.1 (*)    HCT 30.0 (*)    RDW 16.2 (*)    Eosinophils Absolute 3.6 (*)    All other components within normal limits    EKG: None  Radiology: No results found.   Procedures   Medications Ordered in the ED  hydrOXYzine  (ATARAX ) tablet 10 mg (has no administration in time range)                                    Medical Decision Making   Patient here with rash that has been ongoing since starting his IV abx Meropenem  on 05/20/2024, he reports rash has been ongoing however he developed a rash which has been concerning to him. He also reports this rash is very itchy. On exam no blistering noted, small wounds noted through his  upper extremities, chest.  He does not have any systemic signs his vitals are within normal limits.  His blood work here is unremarkable.  I did review the chart and they were concern for SJS, TN.  I do not think that this is the case at this time, there is no oral involvement.  Please see photos of rash. No signs of sepsis.   6:44 PM Spoke to infectious disease Dr. Bianca who recommended hold off  on antibiotics at this time, will need to be evaluated for rash tomorrow in clinic.  I discussed this with patient, him and his wife are agreeable at this time.  He is requesting medication for itching, given Atarax  while in the ED.  He is also telling me that he has an L2 spasms keep coming on, therefore he is requesting a refill of his Robaxin , this will be provided for patient at this time.  Will go ahead and prescribe the Atarax  to help with the itching.  They will call the office tomorrow morning for further management.   Portions of this note were generated with Scientist, clinical (histocompatibility and immunogenetics). Dictation errors may occur despite best attempts at proofreading.   Final diagnoses:  Rash    ED Discharge Orders          Ordered    methocarbamol  (ROBAXIN ) 500 MG tablet  2 times daily        06/17/24 1855    hydrOXYzine  (ATARAX ) 25 MG tablet  Daily        06/17/24 1857               Bryan Omura, PA-C 06/17/24 1859  "

## 2024-06-17 NOTE — Telephone Encounter (Signed)
 Received call from Karna, nurse with [facility], wanting to know what plan is for antibiotics as patient told her antibiotics were stopped yesterday. She confirms patient did not go to the ED. He does not see Dr. Harden until next Tuesday.   Home health needs to know if PICC needs to be pulled or if patient is being switched to alternative medication. She will go ahead and draw labs today.   Karna [phone]  Duwaine Lowe, BSN, RN

## 2024-06-17 NOTE — Telephone Encounter (Signed)
 He needs to go to ed if he has rash all over. Or seen as soon as possible in clinic(either today or tomorrow). He needs to to be assessed for next step.

## 2024-06-17 NOTE — Discharge Instructions (Addendum)
 We discussed this case with your infectious disease team, you will need to call them tomorrow morning in order to establish an appointment.  You were given a prescription for muscle relaxers at your request, due to a prior injury.  Please take these as prescribed.  The second medication is called Atarax , this should help with itching.

## 2024-06-17 NOTE — Telephone Encounter (Signed)
 Called [name] Fernandez, no answer. Left HIPAA compliant voicemail requesting callback.   Called and spoke with [name], she states [name] Fernandez will not go to the ED and that they've been that route before and end up waiting 10-12 hours. States [name] Fernandez did not take his IV dose last night and thinks the rash is getting better.   [name] put [name] Fernandez on the phone who states he just finished talking with Dr. Dennise and that she will be switching his regimen.   [place] Dube, BSN, RN

## 2024-06-17 NOTE — ED Provider Triage Note (Cosign Needed Addendum)
 Emergency Medicine Provider Triage Evaluation Note  [name] Fernandez , a 84 y.o. male  was evaluated in triage.  Pt complains of rash, on PICC abx, possibly from meropenum. Sent to the ER for change in antibiotic.  Bilateral leg swelling for  a few weeks, progressively worsening, care team thinks this is related to his medicine.  Treatment for left foot infection.  Review of Systems  Positive: Rash, leg swelling  Negative: Vomiting, SHOB  Physical Exam  There were no vitals taken for this visit. Gen:   Awake, no distress   Resp:  Normal effort  MSK:   Moves extremities without difficulty  Other:  No oral lesions   Medical Decision Making  Medically screening exam initiated at 3:00 PM.  Appropriate orders placed.  [name] Fernandez was informed that the remainder of the evaluation will be completed by another provider, this initial triage assessment does not replace that evaluation, and the importance of remaining in the ED until their evaluation is complete.     Beverley Leita LABOR, PA-C 06/17/24 1501    Beverley Leita LABOR, PA-C 06/17/24 1502

## 2024-06-18 ENCOUNTER — Encounter: Payer: Self-pay | Admitting: Internal Medicine

## 2024-06-18 ENCOUNTER — Ambulatory Visit: Admitting: Internal Medicine

## 2024-06-18 ENCOUNTER — Telehealth: Payer: Self-pay

## 2024-06-18 ENCOUNTER — Other Ambulatory Visit: Payer: Self-pay

## 2024-06-18 VITALS — BP 154/83 | HR 72 | Temp 97.7°F | Ht 65.0 in | Wt 167.0 lb

## 2024-06-18 DIAGNOSIS — T148XXD Other injury of unspecified body region, subsequent encounter: Secondary | ICD-10-CM

## 2024-06-18 DIAGNOSIS — M86672 Other chronic osteomyelitis, left ankle and foot: Secondary | ICD-10-CM

## 2024-06-18 DIAGNOSIS — T148XXA Other injury of unspecified body region, initial encounter: Secondary | ICD-10-CM

## 2024-06-18 NOTE — Telephone Encounter (Signed)
 Ron went to the ED yesterday. EDP recommended clinic follow up.   Spoke with patient's wife [name] Simas will come in this afternoon.   Temple Sporer, BSN, RN

## 2024-06-18 NOTE — Telephone Encounter (Signed)
 Per Dr. Dennise pull picc today and patient was taken off Iv abx due to rash. Sent a message to Holley Herring RN at [facility] about orders as well.    Faxed over records request to [facility]. Fax # [phone]

## 2024-06-18 NOTE — Progress Notes (Signed)
 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   Osteomyelitis (HCC) 06/16/2022   Tenosynovitis of tibialis posterior tendon 06/15/2022   Wound infection 06/15/2022   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   Anemia 03/13/2019   Chronic venous insufficiency 03/12/2019   CKD (chronic kidney disease) stage 2, 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/2015   COPD (chronic obstructive pulmonary disease) (HCC) 12/08/2015   Chronic pain syndrome 05/26/2015   DDD (degenerative disc disease), lumbar 05/26/2015   Medication management 05/26/2015   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  Patient underwent I&D on 12/12, cultures grew Pseudomonas MDR, E faecalis patient initially transition meropenem , changed to high dose given increasing MIC's.  Plan was to treat with 4 weeks of meropenem  and p.o. amoxicillin .  Since discharge patient developed rash that was pruritic Plan was to treat with 6 weeks of antibiotics.  He had noted rash has been going on for a while but has worsened, had become blistering.  He had also lower extremity swelling.  Counseled to go to the ED for further management and hold antibiotics.  He was seen in the ED on 1/6 overall stable given hydroxyzine . Today notes rash is feeling better, her lower extremity swelling resolved.  No fevers understands her shortness of breath. Review of Systems  All other systems reviewed and are negative.   Past Medical History:  Diagnosis Date   Anemia    Celiac disease    diagnoses 06/24/12   Chronic ulcer of ankle (HCC)    left   COPD (chronic obstructive pulmonary disease) (HCC)    Esophageal stricture    GERD (gastroesophageal reflux disease)    Hiatal hernia    Hypertension    patient states  no current HTN   Hypothyroidism    Pre-diabetes    Prediabetes    Shortness of breath    from oxycodone  at times   Vitamin B12 deficiency    Vitamin D  deficiency     Outpatient Medications Prior to Visit  Medication Sig Dispense Refill   amoxicillin  (AMOXIL ) 500 MG capsule Take 2 capsules (1,000 mg total) by mouth 3 (three)  times daily for 26 days. 156 capsule 0   Ascorbic Acid  (VITAMIN C ) 1000 MG tablet Take 1,000 mg by mouth in the morning.     betamethasone  dipropionate 0.05 % cream Apply 1 Application topically 2 (two) times daily as needed (skin rash/irritation.).     diphenhydrAMINE  (BENADRYL ) 25 MG tablet Take 25 mg by mouth in the morning and at bedtime.     doxepin  (SINEQUAN ) 10 MG capsule Take 10-20 mg by mouth at bedtime as needed (itching).     hydrOXYzine  (ATARAX ) 25 MG tablet Take 1 tablet (25 mg  total) by mouth daily for 7 days. 7 tablet 0   MAGNESIUM  PO Take 2 capsules by mouth at bedtime.     MELATONIN PO Take 10 mg by mouth at bedtime.     meropenem  (MERREM ) IVPB Inject 2 g into the vein every 12 (twelve) hours for 27 days. Indication:  Psuedomonas ankle wound First Dose: Yes Last Day of Therapy:  06/25/24 Labs - Once weekly:  CBC/D and BMP, Labs - Once weekly: ESR and CRP Method of administration: Mini-Bag Plus / Gravity Method of administration may be changed at the discretion of home infusion pharmacist based upon assessment of the patient and/or caregiver's ability to self-administer the medication ordered. 54 Units 0   methocarbamol  (ROBAXIN ) 500 MG tablet Take 1 tablet (500 mg total) by mouth 2 (two) times daily for 7 days. 14 tablet 0   omeprazole  (PRILOSEC  OTC) 20 MG tablet Take 20 mg by mouth daily before breakfast.     Oxycodone  HCl 10 MG TABS Take 1 tablet (10 mg total) by mouth 3 (three) times daily as needed. 30 tablet 0   torsemide  (DEMADEX ) 20 MG tablet Take 2 tablets (40 mg total) by mouth daily as needed (For worsening swelling or more than 2 pound weight gain from your baseline.). Take 2 tablet  Daily for Fluid Retention / Leg Swelling     triamcinolone  cream (KENALOG ) 0.1 % APPLY TO THE LEFT LEG UNDER THE WRAP DAILY WHERE THERE IS ITCHING OR RASH X 2 WEEKS OR UNTIL HEALED 454 g 2   Wound Cleansers (VASHE WOUND EX) Apply 1 Application topically daily. ANKLE WOUND     No facility-administered medications prior to visit.     Allergies[1]  Social History[2]  Family History  Problem Relation Age of Onset   Esophageal cancer Father    Cancer Father    Early death Mother     Objective:  There were no vitals filed for this visit. There is no height or weight on file to calculate BMI.  Physical Exam Constitutional:      General: He is not in acute distress.    Appearance: He is normal weight. He is not toxic-appearing.  HENT:     Head: Normocephalic and  atraumatic.     Right Ear: External ear normal.     Left Ear: External ear normal.     Nose: No congestion or rhinorrhea.     Mouth/Throat:     Mouth: Mucous membranes are moist.     Pharynx: Oropharynx is clear.  Eyes:     Extraocular Movements: Extraocular movements intact.     Conjunctiva/sclera: Conjunctivae normal.     Pupils: Pupils are equal, round, and reactive to light.  Cardiovascular:     Rate and Rhythm: Normal rate and regular rhythm.     Heart sounds: No murmur heard.    No friction rub. No gallop.  Pulmonary:  Effort: Pulmonary effort is normal.     Breath sounds: Normal breath sounds.  Abdominal:     General: Abdomen is flat. Bowel sounds are normal.     Palpations: Abdomen is soft.  Musculoskeletal:        General: No swelling. Normal range of motion.     Cervical back: Normal range of motion and neck supple.  Skin:    General: Skin is warm and dry.  Neurological:     General: No focal deficit present.     Mental Status: He is oriented to person, place, and time.  Psychiatric:        Mood and Affect: Mood normal.    Mild pitcheal rash on back, healing  Lab Results: Lab Results  Component Value Date   WBC 10.6 (H) 06/17/2024   HGB 10.1 (L) 06/17/2024   HCT 30.0 (L) 06/17/2024   MCV 93.2 06/17/2024   PLT 306 06/17/2024    Lab Results  Component Value Date   CREATININE 0.95 06/17/2024   BUN 24 (H) 06/17/2024   NA 136 06/17/2024   K 4.9 06/17/2024   CL 102 06/17/2024   CO2 26 06/17/2024    Lab Results  Component Value Date   ALT 32 06/17/2024   AST 50 (H) 06/17/2024   ALKPHOS 163 (H) 06/17/2024   BILITOT 0.8 06/17/2024     Assessment & Plan:  #Left ankle wound with history of osteomyelitis #Medication monitoring #Concern for drug reaction/abx-rash - Patient has had left leg wound requiring debridement.  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  Admitted for left lower ankle wound requiring I&D taken to the OR on 12/12, noted soft tissue muscle fascia tendon debridement.  Pseudomonas was MDR from the OR, E faecalis.  MIC for meropenem  1 for 1-2.  Patient discharged on high-dose meropenem  and amoxicillin  to complete 4 weeks EOT 1//26.  Notes that following discharge she started having a rash.  He could tolerated but then it became blistering for the last couple days.  Consulted with ED and hold antibiotics.  He also had lower extremity swelling. -Seen by Ivin Domino a year ago(on [place] today) bidpy after alast course of ab with zosyn , pt had rash, notes it was drug related  PCP  -needs pcp follow up for the torsemide  -LE Swelling resolved with cessation of abx.Rash is not c/w typical drug rash but given prior hx with zosyn , Peripheral eos  3.6K will hold abx.  CMP overall stable from 1/6 Plan: -Stop abx, pull picc.  Is received about 3 weeks total of appropriate antibiotics, should be sufficient.  Given that he has had multiple debridements at that site to will need close monitoring.  Patient declined opening dressing so wound can be assessed.  Wife had picture of wound on her phone which overall looked clean.  -Records from dermatology -Follow-up with orthopedics -Monitor rash -F/U in 2 weeks  Loney Stank, MD [facility]   06/18/2024  2:09 PM I personally spent a total of 42 minutes in the care of the patient today including preparing to see the patient, getting/reviewing separately obtained history, performing a medically appropriate exam/evaluation, counseling and educating, placing orders, documenting clinical information in the EHR, independently interpreting results, and communicating  results.      [1]  Allergies Allergen Reactions    Feraheme  [Ferumoxytol ] Swelling and Other (See Comments)    Swelling of lips and tongue, could not talk 30 minutes after the infusion.   Latex Other (See Comments)    Latex butterfly bandages tore off the skin and caused terrible blisters   Sulfa  Antibiotics Hives    Severe itching, blisters   Wound Dressing Adhesive Other (See Comments)    Latex butterfly bandages tore off the skin and caused terrible blisters   Levothyroxine  Swelling and Other (See Comments)    Lip swelling   Doxycycline  Nausea Only  [2]  Social History Tobacco Use   Smoking status: Former    Current packs/day: 0.00    Average packs/day: 1.2 packs/day for 31.7 years (37.6 ttl pk-yrs)    Types: Cigarettes    Start date: 06/27/1960    Quit date: 06/27/1980    Years since quitting: 44.0    Passive exposure: Past   Smokeless tobacco: Never  Vaping Use   Vaping status: Never Used  Substance Use Topics   Alcohol use: No   Drug use: No   "

## 2024-06-24 ENCOUNTER — Ambulatory Visit (INDEPENDENT_AMBULATORY_CARE_PROVIDER_SITE_OTHER): Admitting: Orthopedic Surgery

## 2024-06-24 ENCOUNTER — Encounter: Payer: Self-pay | Admitting: Orthopedic Surgery

## 2024-06-24 DIAGNOSIS — S91002A Unspecified open wound, left ankle, initial encounter: Secondary | ICD-10-CM

## 2024-06-24 DIAGNOSIS — L97322 Non-pressure chronic ulcer of left ankle with fat layer exposed: Secondary | ICD-10-CM | POA: Diagnosis not present

## 2024-06-24 NOTE — Progress Notes (Signed)
 "  Office Visit Note   Patient: [name] Fernandez           Date of Birth: 17-Jul-1940           MRN: [id number] Visit Date: 06/24/2024              Requested by: Alvia Corean CROME, FNP [address] [place],  [place] [address] PCP: Alvia Corean CROME, FNP  Chief Complaint  Patient presents with   Left Ankle - Routine Post Op      HPI: Discussed the use of AI scribe software for clinical note transcription with the patient, who gave verbal consent to proceed.  History of Present Illness [name] Fernandez is an 84 year old male with chronic L5 vertebral compression fracture and chronic non-healing foot wound who presents for evaluation of severe back pain and ongoing wound management.  Severe lower back pain persists at the site of a chronic L5 compression fracture. The pain is described as awful, with significant nocturnal discomfort, interfering with sleep and positioning.  A chronic non-healing foot wound measuring 7 cm by 11 cm remains present. The wound has demonstrated delayed healing despite prior interventions, including Kerasys tissue grafts and daily dressing changes. He expresses concern regarding the prolonged healing time. Home health nursing support continues to assist with wound care.  He completed a 19-day course of intravenous meropenem , discontinued in early January due to adverse effects. During therapy, he developed lower extremity edema, pruritus, and a rash with small blisters under his arm at the PICC line site. These symptoms have persisted since cessation of the antibiotic. He remains uncertain about the remaining supply of meropenem  and its use in topical wound care, referencing advice from home health nursing.     Assessment & Plan: Visit Diagnoses:  1. 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 Ankle wound, left, initial encounter     Plan: Assessment and Plan Assessment & Plan Chronic non-healing foot wound Chronic,  refractory foot wound with flat granulation tissue, 7 cm x 11 cm. Previous advanced wound care and intravenous antibiotics ineffective. Current management with Kerasys tissue graft and topical meropenem  to promote healing. - Applied Kerasys micrograft (38 cm) mixed with 1 g meropenem  to the wound. - Applied Vaseline gauze over the graft. - Instructed daily dressing changes with dampened 4x4s and Bosch dressing, leaving gauze in place and changing outer dressings daily. - Scheduled follow-up in one week. - Plan for further application of Kerasys and meropenem  at next visit.  Adverse reaction to meropenem  Developed swelling, pruritus, and rash after 19 days of intravenous meropenem , with blistering at the PICC line site. Reaction attributed to intravenous administration. - Documented adverse effects to intravenous meropenem . - Discontinued intravenous meropenem . - Plan to use topical meropenem  mixed with Kerasys tissue graft for local wound therapy. - Instructed to bring remaining meropenem  to next visit for potential use in wound care.      Follow-Up Instructions: Return in about 1 week (around 07/01/2024).   Ortho Exam  Patient is alert, oriented, no adenopathy, well-dressed, normal affect, normal respiratory effort. Physical Exam SKIN: Wound bed flat with healthy granulation tissue, measuring 7 cm by 11 cm.    The wound healing has stalled, the wound bed has healthy granulation tissue, and patient presents for evaluation and application of Kerecis MariGen Micro graft. After informed consent a 10 blade knife was used to debride the skin and soft tissue to healthy viable  bleeding granulation tissue.  Silver  nitrate was used for hemostasis. The wound measures: 11 cm in length, 7cm  in width, 5 mm in depth, wound location lateral left ankle Kerecis MariGen micro tissue graft 38 cm2 was applied, and there was no wastage.  Please see the photo below of the Lot number and expiration date. The  micro tissue graft was covered with a nonadherent Adaptic dressing, bolstered with 4 x 4 gauze and secured with a compression wrap.     Imaging: No results found.   Labs: Lab Results  Component Value Date   HGBA1C 6.2 (H) 09/22/2023   HGBA1C 6.5 (H) 04/10/2023   HGBA1C 6.3 (H) 10/05/2022   ESRSEDRATE 34 (H) 01/15/2023   ESRSEDRATE 32 (H) 06/15/2022   ESRSEDRATE 40 (H) 06/14/2022   CRP 0.7 01/15/2023   CRP 0.8 06/15/2022   CRP <0.5 06/14/2022   LABURIC 7.8 05/20/2014   LABURIC 6.8 05/20/2013   REPTSTATUS 05/30/2024 FINAL 05/23/2024   GRAMSTAIN  05/23/2024    NO WBC SEEN ABUNDANT GRAM NEGATIVE RODS Performed at [facility], [address], [place], [place] [address]    CULT  05/23/2024    ABUNDANT PSEUDOMONAS AERUGINOSA NO ANAEROBES ISOLATED FEW ENTEROCOCCUS FAECALIS    LABORGA PSEUDOMONAS AERUGINOSA 05/23/2024   LABORGA ENTEROCOCCUS FAECALIS 05/23/2024     Lab Results  Component Value Date   ALBUMIN 3.5 06/17/2024   ALBUMIN 2.9 (L) 05/23/2024   ALBUMIN 3.9 09/21/2023   PREALBUMIN 18 06/14/2022    Lab Results  Component Value Date   MG 1.8 04/10/2023   MG 1.8 06/19/2022   MG 1.9 04/04/2022   Lab Results  Component Value Date   VD25OH 45 04/10/2023   VD25OH 57 10/05/2022   VD25OH 59 04/04/2022    Lab Results  Component Value Date   PREALBUMIN 18 06/14/2022      Latest Ref Rng & Units 06/17/2024    3:03 PM 05/23/2024   12:12 PM 12/06/2023    2:24 PM  CBC EXTENDED  WBC 4.0 - 10.5 K/uL 10.6  8.4  7.6   RBC 4.22 - 5.81 MIL/uL 3.22  3.46  3.54   Hemoglobin 13.0 - 17.0 g/dL 89.8  88.8  89.3   HCT 39.0 - 52.0 % 30.0  32.8  33.3   Platelets 150 - 400 K/uL 306  495  541   NEUT# 1.7 - 7.7 K/uL 3.9  5.8    Lymph# 0.7 - 4.0 K/uL 2.1  1.4       There is no height or weight on file to calculate BMI.  Orders:  No orders of the defined types were placed in this encounter.  No orders of the defined types were placed in this encounter.     Procedures: No procedures performed  Clinical Data: No additional findings.  ROS:  All other systems negative, except as noted in the HPI. Review of Systems  Objective: Vital Signs: There were no vitals taken for this visit.  Specialty Comments:  No specialty comments available.  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) 06/16/2022   Osteomyelitis (HCC) 06/16/2022   Tenosynovitis of tibialis posterior tendon 06/15/2022   Wound infection 06/15/2022   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   Anemia 03/13/2019   Chronic venous insufficiency 03/12/2019   CKD (chronic kidney disease) stage 2, 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/2015   COPD (chronic obstructive pulmonary disease) (HCC) 12/08/2015   Chronic pain syndrome 05/26/2015   DDD (degenerative disc disease), lumbar 05/26/2015   Medication management 05/26/2015   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   Lumbago with sciatica 08/06/2014   Hyperlipidemia 08/20/2013   Essential hypertension    Abnormal glucose    Vitamin D  deficiency    Arthritis    Vitamin B12 deficiency    Celiac disease 06/19/2012   GERD (gastroesophageal reflux disease) 04/30/2012   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 tube) procedure 04/30/2012   Hypothyroidism 04/30/2012   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   Past Medical History:  Diagnosis Date   Anemia    Celiac disease    diagnoses 06/24/12   Chronic ulcer of ankle (HCC)    left   COPD (chronic obstructive pulmonary  disease) (HCC)    Esophageal stricture    GERD (gastroesophageal reflux disease)    Hiatal hernia    Hypertension    patient states  no current HTN   Hypothyroidism    Pre-diabetes    Prediabetes    Shortness of breath    from oxycodone  at times   Vitamin B12 deficiency    Vitamin D  deficiency     Family History  Problem Relation Age of Onset   Esophageal cancer Father    Cancer Father    Early death Mother     Past Surgical History:  Procedure Laterality Date   APPENDECTOMY     APPLICATION OF WOUND VAC Left 01/12/2023   Procedure: APPLICATION OF WOUND VAC;  Surgeon: Harden Jerona GAILS, MD;  Location: [facility] OR;  Service: Orthopedics;  Laterality: Left;   BACK SURGERY     3 diff surgeries for vertebrea broken   CYSTOSCOPY W/ RETROGRADES Bilateral 12/11/2023   Procedure: CYSTOSCOPY, WITH RETROGRADE PYELOGRAM;  Surgeon: Shane Steffan BROCKS, MD;  Location: [facility];  Service: Urology;  Laterality: Bilateral;   EYE SURGERY     bilateral cataract surgery   HERNIA REPAIR  02/10/1949   bilateral groin as child   HIATAL HERNIA REPAIR     I & D EXTREMITY Left 06/16/2022   Procedure: IRRIGATION AND DEBRIDEMENT ANKLE;  Surgeon: Harden Jerona GAILS, MD;  Location: [facility] OR;  Service: Orthopedics;  Laterality: Left;   I & D EXTREMITY Left 07/21/2022   Procedure: LEFT ANKLE DEBRIDEMENT;  Surgeon: Harden Jerona GAILS, MD;  Location: [facility] OR;  Service: Orthopedics;  Laterality: Left;   I & D EXTREMITY Left 11/10/2022   Procedure: LEFT ANKLE DEBRIDEMENT;  Surgeon: Harden Jerona GAILS, MD;  Location: [facility] OR;  Service: Orthopedics;  Laterality: Left;   I & D EXTREMITY Left 01/12/2023   Procedure: LEFT ANKLE DEBRIDEMENT;  Surgeon: Harden Jerona GAILS, MD;  Location: [facility] OR;  Service: Orthopedics;  Laterality: Left;   INCISION AND DRAINAGE OF DEEP ABSCESS, ANKLE Left 05/23/2024   Procedure: INCISION AND DRAINAGE OF DEEP ABSCESS, ANKLE;  Surgeon: Harden Jerona GAILS, MD;  Location: [facility] OR;  Service: Orthopedics;  Laterality: Left;  LEFT ANKLE  IRRIGATION AND DEBRIDEMENT   INCISION AND DRAINAGE OF DEEP ABSCESS, ANKLE Left 05/28/2024   Procedure: INCISION AND DRAINAGE OF LEFT ANKLE;  Surgeon: Harden Jerona GAILS, MD;  Location: [facility] OR;  Service: Orthopedics;  Laterality: Left;   JOINT REPLACEMENT  04/12/2010   right knee replacement   TONSILLECTOMY     as child   TOTAL KNEE REVISION  11/20/2011   Procedure: TOTAL KNEE REVISION;  Surgeon: Donnice JONETTA Car, MD;  Location: [facility];  Service: Orthopedics;  Laterality: Right;  Right Total Knee Revision   Social History   Occupational History   Occupation: Retired    Associate Professor: [employer]  Tobacco Use   Smoking status: Former    Current packs/day: 0.00    Average packs/day: 1.2 packs/day for 31.7 years (37.6 ttl pk-yrs)    Types: Cigarettes    Start date: 06/27/1960    Quit date: 06/27/1980    Years since quitting: 44.0    Passive exposure: Past   Smokeless tobacco: Never  Vaping Use   Vaping status: Never Used  Substance and Sexual Activity   Alcohol use: No   Drug use: No   Sexual activity: Not Currently         "

## 2024-06-26 ENCOUNTER — Inpatient Hospital Stay: Payer: Self-pay | Admitting: Internal Medicine

## 2024-06-27 ENCOUNTER — Telehealth: Payer: Self-pay

## 2024-06-27 ENCOUNTER — Other Ambulatory Visit: Payer: Self-pay

## 2024-06-27 ENCOUNTER — Other Ambulatory Visit (HOSPITAL_COMMUNITY): Payer: Self-pay | Admitting: Neuroradiology

## 2024-06-27 DIAGNOSIS — S32050A Wedge compression fracture of fifth lumbar vertebra, initial encounter for closed fracture: Secondary | ICD-10-CM

## 2024-06-27 DIAGNOSIS — S32000A Wedge compression fracture of unspecified lumbar vertebra, initial encounter for closed fracture: Secondary | ICD-10-CM

## 2024-06-27 NOTE — Telephone Encounter (Addendum)
 Patient's wife called to ask about next steps for patient. She is very concerned about his pain.   Patient last saw Dr. Lester on 05/21/2024.   At that time, Dr. Lester discussed a Vertebroplasty/ kyphoplasty. But wanted to hold off due to concern about infection of his venous stasis ulcer.   He is in pain 10/10, sharp pain that is constant. It has worsened since his visit with us  in December.   They mention that he is taking Oxycodone  15mg . He states that he was not prescribed this full amount but had some left over from a hospital discharge.   Pain is 8/10 with the oxycodone .   I asked about his last dosages. He took oxycodone  15mg  at 5am then again at 8am this morning.   We discussed the precautions of taking oxycodone  doses close together due to risk of overdose. I informed his wife to call 911 if she develops decreased rate in breathing, changes in his pupil size especially extremely small, and new confusion. She indicates understanding.   They also endorse constipation for the last 3-4 days without a bowel movement.   We discussed the importance of hydration, over the counter laxative use and prevention of constipation while taking a narcotic.   Per his wife he has been taking an OTC Fiber laxative supplement and took Miralax  last night.   I advised that if he has not gone by this afternoon that they can try liquid magnesium  citrate from the pharmacy. I discussed that this is the recommendation used when patients have constipation after surgery.  They deny any symptoms of severe abdominal pain or lack of ability to pass gas.   I told them that I would contact Dr. Lester for direction but that they may need to go to the ER for pain management.

## 2024-06-27 NOTE — Telephone Encounter (Signed)
 I spoke with Dr. Lester regarding the patient.   He advises that he may need to be seen in the ER to get his pain under control given the little improvement of his pain with the oxycodone  that he is taking.   He said that if they were to come in over the weekend he could try to get his Vertebroplasty kyphoplasty done if they came to the ER given his increase in pain.   The other option would be to have this done early this week to follow Dr. Wendall other cases.  I spoke with the family. They would like to avoid the ER as much as possible and do not believe that he could tolerate sitting for 10+ hours waiting to be seen.   Ms. Germond asked if Dr. Lester can admit the patient for pain control and to do this procedure. He declines at this time as the hospitalist would need to make the decision to admit.   Patient's wife asked if on Monday or Tuesday this could be done outpatient to avoid the ER and get her husband taken care of.   I sent Dr. Lester a follow up message to clarify needed plan to do this early next week as previously discussed.   I will call Ms. Fluegel back with an answer so we can plan appropriately.

## 2024-06-27 NOTE — Telephone Encounter (Signed)
 Per Dr. Lester, ok to add on patient for Monday afternoon. Case is scheduled and insurance is submitted as expedited given the patients condition. I called the patients wife and gave them the following instructions per Delon HERO.   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   I reviewed the patient's medication and none of them need to be held for his procedure.

## 2024-06-30 ENCOUNTER — Ambulatory Visit (HOSPITAL_COMMUNITY)
Admission: RE | Admit: 2024-06-30 | Discharge: 2024-06-30 | Disposition: A | Discharge status: Home / Self Care | Source: Ambulatory Visit | Attending: Neuroradiology | Admitting: Neuroradiology

## 2024-06-30 ENCOUNTER — Other Ambulatory Visit: Payer: Self-pay

## 2024-06-30 DIAGNOSIS — S32050A Wedge compression fracture of fifth lumbar vertebra, initial encounter for closed fracture: Secondary | ICD-10-CM | POA: Diagnosis not present

## 2024-06-30 DIAGNOSIS — M8008XA Age-related osteoporosis with current pathological fracture, vertebra(e), initial encounter for fracture: Secondary | ICD-10-CM | POA: Insufficient documentation

## 2024-06-30 DIAGNOSIS — L97509 Non-pressure chronic ulcer of other part of unspecified foot with unspecified severity: Secondary | ICD-10-CM | POA: Diagnosis not present

## 2024-06-30 DIAGNOSIS — Z87891 Personal history of nicotine dependence: Secondary | ICD-10-CM | POA: Diagnosis not present

## 2024-06-30 HISTORY — PX: IR VERTEBROPLASTY LUMBAR BX INC UNI/BIL INC/INJECT/IMAGING: IMG5516

## 2024-06-30 MED ORDER — MIDAZOLAM HCL (PF) 2 MG/2ML IJ SOLN
INTRAMUSCULAR | Status: AC | PRN
  Administered 2024-06-30: .5 mg via INTRAVENOUS
  Administered 2024-06-30 (×3): 1 mg via INTRAVENOUS

## 2024-06-30 MED ORDER — CEFAZOLIN SODIUM-DEXTROSE 2-4 GM/100ML-% IV SOLN
INTRAVENOUS | Status: AC
  Filled 2024-06-30: qty 100

## 2024-06-30 MED ORDER — MIDAZOLAM HCL 2 MG/2ML IJ SOLN
INTRAMUSCULAR | Status: AC
  Filled 2024-06-30: qty 4

## 2024-06-30 MED ORDER — LIDOCAINE HCL (PF) 1 % IJ SOLN
INTRAMUSCULAR | Status: AC
  Filled 2024-06-30: qty 30

## 2024-06-30 MED ORDER — HYDROMORPHONE HCL 1 MG/ML IJ SOLN
INTRAMUSCULAR | Status: AC
  Filled 2024-06-30: qty 1

## 2024-06-30 MED ORDER — FENTANYL CITRATE (PF) 100 MCG/2ML IJ SOLN
INTRAMUSCULAR | Status: AC
  Filled 2024-06-30: qty 4

## 2024-06-30 MED ORDER — FENTANYL CITRATE (PF) 100 MCG/2ML IJ SOLN
INTRAMUSCULAR | Status: AC | PRN
  Administered 2024-06-30: 50 ug via INTRAVENOUS
  Administered 2024-06-30: 25 ug via INTRAVENOUS
  Administered 2024-06-30: 50 ug via INTRAVENOUS

## 2024-06-30 MED ORDER — LIDOCAINE HCL (PF) 1 % IJ SOLN
30.0000 mL | Freq: Once | INTRAMUSCULAR | Status: AC
  Administered 2024-06-30: 15 mL via INTRADERMAL

## 2024-06-30 MED ORDER — CEFAZOLIN SODIUM-DEXTROSE 2-4 GM/100ML-% IV SOLN
INTRAVENOUS | Status: AC | PRN
  Administered 2024-06-30: 2 g via INTRAVENOUS

## 2024-06-30 NOTE — Brief Op Note (Signed)
" °  NEUROSURGERY BRIEF OP NOTE   PRE-OP DX: L5 osteoporotic compression fracture  POSTOP DX: Same  PROCEDURE: L5 vertebroplasty  SURGEON: Nancyann LULLA Burns   ANESTHESIA: IV Sedation with Local 3.5 mg Versed , fentanyl  150  EBL: Minimal  COMPLICATIONS: No immediate  CONDITION: Stable  FINDINGS:  L5 compression fracture treated with laxatives and vertebroplasty.  Nancyann LULLA Burns  @today @ 4:13 PM  "

## 2024-06-30 NOTE — Progress Notes (Signed)
 Pt and wife received discharge instructions, teach back performed. Iv removed, no complications. Back site is clean dry intact, no sign of bleeding. Pt escorted out via wheelchair to wife's vehicle.

## 2024-06-30 NOTE — H&P (Signed)
 [name] Fernandez is a 84 y.o. male.  Chief Complaint: L5 fracture.  HPI: Chronic L5 compression fracture and non healing foot ulcer presents for a procedure due to severe back pain. Allergies: Feraheme  [ferumoxytol ], Latex, Sulfa  antibiotics, Wound dressing adhesive, Levothyroxine , and Doxycycline   ASA: 3 M: Class 2 (Visibility of the soft palate, uvula, and fauces)  Assessment/Plan: The current plan is to proceed with the L5 Vertebroplasty    I have had an extensive discussion with the patient regarding the nature of the procedure, including the indications and expected benefits. The risks were reviewed in detail, including the risks of Access site hematoma , Hemorrhage , and Death. Alternatives to the procedure were discussed. The patient expressed a clear understanding of these risks and benefits, had all questions answered to their satisfaction, and has expressed a desire to proceed.   ROS: The patient reports being in constant back pain.    Physical Exam: HENT:  Head: Normocephalic.  Nose: Nose normal.  Eyes:  Pupils: Pupils are equal, round, and reactive to light.  Cardiovascular:  Rate and Rhythm: Normal rate.  Pulmonary:  Effort: Pulmonary effort is normal.  Abdominal:  General: Abdomen is flat.  Musculoskeletal:  Cervical back: Normal range of motion.  Neurological:  Mental Status: Patient is alert.  Cranial Nerves: Cranial nerves 2-12 are intact.  Sensory: Sensation is intact.  Motor: Motor function is intact. Limited movement due to pain  Coordination: Coordination is intact.  Skin:  Left foot ulcer - w bandage. CDI  Vitals:   06/30/24 1349  BP: 131/75  Pulse: 72  Resp: 18  Temp: 98.2 F (36.8 C)  SpO2: 100%    Scheduled Meds: Continuous Infusions: PRN Meds:.  Past Medical History:  Diagnosis Date   Anemia    Celiac disease    diagnoses 06/24/12   Chronic ulcer of ankle (HCC)    left   COPD (chronic obstructive pulmonary disease) (HCC)     Esophageal stricture    GERD (gastroesophageal reflux disease)    Hiatal hernia    Hypertension    patient states  no current HTN   Hypothyroidism    Pre-diabetes    Prediabetes    Shortness of breath    from oxycodone  at times   Vitamin B12 deficiency    Vitamin D  deficiency     Past Surgical History:  Procedure Laterality Date   APPENDECTOMY     APPLICATION OF WOUND VAC Left 01/12/2023   Procedure: APPLICATION OF WOUND VAC;  Surgeon: Harden Jerona GAILS, MD;  Location: [facility] OR;  Service: Orthopedics;  Laterality: Left;   BACK SURGERY     3 diff surgeries for vertebrea broken   CYSTOSCOPY W/ RETROGRADES Bilateral 12/11/2023   Procedure: CYSTOSCOPY, WITH RETROGRADE PYELOGRAM;  Surgeon: Shane Steffan JAYSON, MD;  Location: [facility];  Service: Urology;  Laterality: Bilateral;   EYE SURGERY     bilateral cataract surgery   HERNIA REPAIR  02/10/1949   bilateral groin as child   HIATAL HERNIA REPAIR     I & D EXTREMITY Left 06/16/2022   Procedure: IRRIGATION AND DEBRIDEMENT ANKLE;  Surgeon: Harden Jerona GAILS, MD;  Location: [facility] OR;  Service: Orthopedics;  Laterality: Left;   I & D EXTREMITY Left 07/21/2022   Procedure: LEFT ANKLE DEBRIDEMENT;  Surgeon: Harden Jerona GAILS, MD;  Location: [facility] OR;  Service: Orthopedics;  Laterality: Left;   I & D EXTREMITY Left 11/10/2022   Procedure: LEFT ANKLE DEBRIDEMENT;  Surgeon: Harden Jerona GAILS, MD;  Location: [facility] OR;  Service: Orthopedics;  Laterality: Left;   I & D EXTREMITY Left 01/12/2023   Procedure: LEFT ANKLE DEBRIDEMENT;  Surgeon: Harden Jerona GAILS, MD;  Location: [facility] OR;  Service: Orthopedics;  Laterality: Left;   INCISION AND DRAINAGE OF DEEP ABSCESS, ANKLE Left 05/23/2024   Procedure: INCISION AND DRAINAGE OF DEEP ABSCESS, ANKLE;  Surgeon: Harden Jerona GAILS, MD;  Location: [facility] OR;  Service: Orthopedics;  Laterality: Left;  LEFT ANKLE IRRIGATION AND DEBRIDEMENT   INCISION AND DRAINAGE OF DEEP ABSCESS, ANKLE Left 05/28/2024   Procedure: INCISION AND DRAINAGE OF LEFT ANKLE;   Surgeon: Harden Jerona GAILS, MD;  Location: [facility] OR;  Service: Orthopedics;  Laterality: Left;   JOINT REPLACEMENT  04/12/2010   right knee replacement   TONSILLECTOMY     as child   TOTAL KNEE REVISION  11/20/2011   Procedure: TOTAL KNEE REVISION;  Surgeon: Donnice JONETTA Car, MD;  Location: [facility];  Service: Orthopedics;  Laterality: Right;  Right Total Knee Revision    Family History  Problem Relation Age of Onset   Esophageal cancer Father    Cancer Father    Early death Mother     Social History   Socioeconomic History   Marital status: Married    Spouse name: [name]   Number of children: 2   Years of education: Not on file   Highest education level: GED or equivalent  Occupational History   Occupation: Retired    Associate Professor: [employer]  Tobacco Use   Smoking status: Former    Current packs/day: 0.00    Average packs/day: 1.2 packs/day for 31.7 years (37.6 ttl pk-yrs)    Types: Cigarettes    Start date: 06/27/1960    Quit date: 06/27/1980    Years since quitting: 44.0    Passive exposure: Past   Smokeless tobacco: Never  Vaping Use   Vaping status: Never Used  Substance and Sexual Activity   Alcohol use: No   Drug use: No   Sexual activity: Not Currently  Other Topics Concern   Not on file  Social History Narrative   Not on file   Social Drivers of Health   Tobacco Use: Medium Risk (06/24/2024)   Patient History    Smoking Tobacco Use: Former    Smokeless Tobacco Use: Never    Passive Exposure: Past  Physicist, Medical Strain: Low Risk (03/07/2024)   Overall Financial Resource Strain (CARDIA)    Difficulty of Paying Living Expenses: Not hard at all  Food Insecurity: No Food Insecurity (06/09/2024)   Epic    Worried About Radiation Protection Practitioner of Food in the Last Year: Never true    Ran Out of Food in the Last Year: Never true  Transportation Needs: No Transportation Needs (06/09/2024)   Epic    Lack of Transportation (Medical): No    Lack of Transportation  (Non-Medical): No  Physical Activity: Inactive (03/07/2024)   Exercise Vital Sign    Days of Exercise per Week: 0 days    Minutes of Exercise per Session: Not on file  Stress: Stress Concern Present (03/07/2024)   [employer] of Occupational Health - Occupational Stress Questionnaire    Feeling of Stress: To some extent  Social Connections: Moderately Isolated (05/23/2024)   Social Connection and Isolation Panel    Frequency of Communication with Friends and Family: Three times a week    Frequency of Social Gatherings with Friends and Family: Once a week    Attends Religious Services:  Patient declined    Active Member of Clubs or Organizations: No    Attends Banker Meetings: Never    Marital Status: Married  Catering Manager Violence: Not At Risk (06/09/2024)   Epic    Fear of Current or Ex-Partner: No    Emotionally Abused: No    Physically Abused: No    Sexually Abused: No  Depression (PHQ2-9): Low Risk (06/09/2024)   Depression (PHQ2-9)    PHQ-2 Score: 1  Alcohol Screen: Not on file  Housing: Unknown (06/09/2024)   Epic    Unable to Pay for Housing in the Last Year: No    Number of Times Moved in the Last Year: Not on file    Homeless in the Last Year: No  Utilities: Not At Risk (06/09/2024)   Epic    Threatened with loss of utilities: No  Health Literacy: Not on file       Please see the top of the document for Procedure, ASA score, Mallampati score, Assessment, and Plan.  Jayson JINNY Eis, NP 1/19/20262:12 PM

## 2024-07-01 ENCOUNTER — Encounter: Payer: Self-pay | Admitting: Orthopedic Surgery

## 2024-07-01 ENCOUNTER — Ambulatory Visit (INDEPENDENT_AMBULATORY_CARE_PROVIDER_SITE_OTHER): Admitting: Orthopedic Surgery

## 2024-07-01 DIAGNOSIS — L97322 Non-pressure chronic ulcer of left ankle with fat layer exposed: Secondary | ICD-10-CM | POA: Diagnosis not present

## 2024-07-01 NOTE — Progress Notes (Signed)
 "  Office Visit Note   Patient: [name] Fernandez           Date of Birth: September 26, 1940           MRN: [id number] Visit Date: 07/01/2024              Requested by: Alvia Corean CROME, FNP [address] [place],  [place] [address] PCP: Alvia Corean CROME, FNP  Chief Complaint  Patient presents with   Left Ankle - Routine Post Op    05/28/2024 I&D left ankle      HPI: Discussed the use of AI scribe software for clinical note transcription with the patient, who gave verbal consent to proceed.  History of Present Illness [name] Fernandez is an 84 year old male with chronic lower leg ulcer who presents for follow-up of a chronic lower leg wound.  He has a chronic lower leg ulcer previously treated with a fish skin graft Cristina) one week ago. The graft was left in place for six days as instructed, but he removed it prior to a hospital visit due to malodor and concerning wound appearance. He continues daily dressing changes at home, using Vashe-moistened gauze and securing with an Ace wrap. He reports persistent wound drainage and odor, but denies fever, chills, or other systemic symptoms.  He underwent kyphoplasty for vertebral compression fracture prior to this visit and continues to experience significant postprocedural spinal pain without improvement. He describes the procedure as difficult due to prone positioning and inadequate sedation, and reports ongoing pain with movement.  He is currently using a probiotic. He is unsure of the expected timeline for improvement. He also notes discomfort with cold temperatures in the hospital and waiting areas during visits.     Assessment & Plan: Visit Diagnoses:  1. 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 No acute infection or need for debridement. - Applied 38 cm Kerasys fish  skin graft with 1 g meropenem . - Instructed daily dressing changes: leave Vaseline gauze, replace 4x4 gauze daily, wrap with Ace wrap. - Instructed to dampen 4x4 gauze with Vashe before application. - Scheduled follow-up in one week.      Follow-Up Instructions: Return in about 1 week (around 07/08/2024).   Ortho Exam  Patient is alert, oriented, no adenopathy, well-dressed, normal affect, normal respiratory effort. Physical Exam SKIN: Wound with 75% granulation tissue and 25% exudated tissue, measuring 6x11 cm.    The wound healing has stalled, the wound bed has healthy granulation tissue, and patient presents for evaluation and application of Kerecis MariGen Micro graft. After informed consent a 10 blade knife was used to debride the skin and soft tissue to healthy viable bleeding granulation tissue.  Silver  nitrate was used for hemostasis. The wound measures: 11 cm in length, 6cm  in width, 1 mm in depth, wound location lateral left ankle Kerecis MariGen micro tissue graft 38 cm2 was applied, and there was no wastage.  Please see the photo below of the Lot number and expiration date. The micro tissue graft was covered with a nonadherent Adaptic dressing, bolstered with 4 x 4 gauze and secured with a compression wrap.     Imaging: No results found.      Labs: Lab Results  Component Value Date   HGBA1C 6.2 (H) 09/22/2023   HGBA1C 6.5 (H)  04/10/2023   HGBA1C 6.3 (H) 10/05/2022   ESRSEDRATE 34 (H) 01/15/2023   ESRSEDRATE 32 (H) 06/15/2022   ESRSEDRATE 40 (H) 06/14/2022   CRP 0.7 01/15/2023   CRP 0.8 06/15/2022   CRP <0.5 06/14/2022   LABURIC 7.8 05/20/2014   LABURIC 6.8 05/20/2013   REPTSTATUS 05/30/2024 FINAL 05/23/2024   GRAMSTAIN  05/23/2024    NO WBC SEEN ABUNDANT GRAM NEGATIVE RODS Performed at [facility], [address] [place], [place] [address]    CULT  05/23/2024    ABUNDANT PSEUDOMONAS AERUGINOSA NO ANAEROBES ISOLATED FEW ENTEROCOCCUS  FAECALIS    LABORGA PSEUDOMONAS AERUGINOSA 05/23/2024   LABORGA ENTEROCOCCUS FAECALIS 05/23/2024     Lab Results  Component Value Date   ALBUMIN 3.5 06/17/2024   ALBUMIN 2.9 (L) 05/23/2024   ALBUMIN 3.9 09/21/2023   PREALBUMIN 18 06/14/2022    Lab Results  Component Value Date   MG 1.8 04/10/2023   MG 1.8 06/19/2022   MG 1.9 04/04/2022   Lab Results  Component Value Date   VD25OH 45 04/10/2023   VD25OH 57 10/05/2022   VD25OH 59 04/04/2022    Lab Results  Component Value Date   PREALBUMIN 18 06/14/2022      Latest Ref Rng & Units 06/17/2024    3:03 PM 05/23/2024   12:12 PM 12/06/2023    2:24 PM  CBC EXTENDED  WBC 4.0 - 10.5 K/uL 10.6  8.4  7.6   RBC 4.22 - 5.81 MIL/uL 3.22  3.46  3.54   Hemoglobin 13.0 - 17.0 g/dL 89.8  88.8  89.3   HCT 39.0 - 52.0 % 30.0  32.8  33.3   Platelets 150 - 400 K/uL 306  495  541   NEUT# 1.7 - 7.7 K/uL 3.9  5.8    Lymph# 0.7 - 4.0 K/uL 2.1  1.4       There is no height or weight on file to calculate BMI.  Orders:  No orders of the defined types were placed in this encounter.  No orders of the defined types were placed in this encounter.    Procedures: No procedures performed  Clinical Data: No additional findings.  ROS:  All other systems negative, except as noted in the HPI. Review of Systems  Objective: Vital Signs: There were no vitals taken for this visit.  Specialty Comments:  No specialty comments available.  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) 06/16/2022   Osteomyelitis (HCC) 06/16/2022   Tenosynovitis of tibialis posterior tendon 06/15/2022   Wound infection 06/15/2022   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   Anemia 03/13/2019   Chronic venous insufficiency 03/12/2019   CKD (chronic kidney disease) stage 2, 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/2015   COPD (chronic obstructive pulmonary disease) (HCC) 12/08/2015   Chronic pain syndrome 05/26/2015   DDD (degenerative disc disease), lumbar 05/26/2015   Medication management 05/26/2015   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   Lumbago with sciatica 08/06/2014   Hyperlipidemia 08/20/2013   Essential hypertension  Abnormal glucose    Vitamin D  deficiency    Arthritis    Vitamin B12 deficiency    Celiac disease 06/19/2012   GERD (gastroesophageal reflux disease) 04/30/2012   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 tube) procedure 04/30/2012   Hypothyroidism 04/30/2012   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   Past Medical History:  Diagnosis Date   Anemia    Celiac disease    diagnoses 06/24/12   Chronic ulcer of ankle (HCC)    left   COPD (chronic obstructive pulmonary disease) (HCC)    Esophageal stricture    GERD (gastroesophageal reflux disease)    Hiatal hernia    Hypertension    patient states  no current HTN   Hypothyroidism    Pre-diabetes    Prediabetes    Shortness of breath    from oxycodone  at times   Vitamin B12 deficiency    Vitamin D  deficiency     Family History  Problem Relation Age of Onset   Esophageal cancer Father    Cancer Father    Early death Mother     Past Surgical History:  Procedure Laterality Date   APPENDECTOMY     APPLICATION OF WOUND VAC Left 01/12/2023   Procedure: APPLICATION OF WOUND VAC;  Surgeon: Harden Jerona GAILS, MD;  Location: [facility] OR;  Service: Orthopedics;  Laterality: Left;   BACK SURGERY     3 diff surgeries for vertebrea broken   CYSTOSCOPY W/ RETROGRADES Bilateral 12/11/2023   Procedure: CYSTOSCOPY,  WITH RETROGRADE PYELOGRAM;  Surgeon: Shane Steffan BROCKS, MD;  Location: [facility];  Service: Urology;  Laterality: Bilateral;   EYE SURGERY     bilateral cataract surgery   HERNIA REPAIR  02/10/1949   bilateral groin as child   HIATAL HERNIA REPAIR     I & D EXTREMITY Left 06/16/2022   Procedure: IRRIGATION AND DEBRIDEMENT ANKLE;  Surgeon: Harden Jerona GAILS, MD;  Location: [facility] OR;  Service: Orthopedics;  Laterality: Left;   I & D EXTREMITY Left 07/21/2022   Procedure: LEFT ANKLE DEBRIDEMENT;  Surgeon: Harden Jerona GAILS, MD;  Location: [facility];  Service: Orthopedics;  Laterality: Left;   I & D EXTREMITY Left 11/10/2022   Procedure: LEFT ANKLE DEBRIDEMENT;  Surgeon: Harden Jerona GAILS, MD;  Location: [facility] OR;  Service: Orthopedics;  Laterality: Left;   I & D EXTREMITY Left 01/12/2023   Procedure: LEFT ANKLE DEBRIDEMENT;  Surgeon: Harden Jerona GAILS, MD;  Location: [facility] OR;  Service: Orthopedics;  Laterality: Left;   INCISION AND DRAINAGE OF DEEP ABSCESS, ANKLE Left 05/23/2024   Procedure: INCISION AND DRAINAGE OF DEEP ABSCESS, ANKLE;  Surgeon: Harden Jerona GAILS, MD;  Location: [facility] OR;  Service: Orthopedics;  Laterality: Left;  LEFT ANKLE IRRIGATION AND DEBRIDEMENT   INCISION AND DRAINAGE OF DEEP ABSCESS, ANKLE Left 05/28/2024   Procedure: INCISION AND DRAINAGE OF LEFT ANKLE;  Surgeon: Harden Jerona GAILS, MD;  Location: [facility] OR;  Service: Orthopedics;  Laterality: Left;   JOINT REPLACEMENT  04/12/2010   right knee replacement   TONSILLECTOMY     as child   TOTAL KNEE REVISION  11/20/2011   Procedure: TOTAL KNEE REVISION;  Surgeon: Donnice JONETTA Car, MD;  Location: [facility];  Service: Orthopedics;  Laterality: Right;  Right Total Knee Revision   Social History   Occupational History   Occupation: Retired    Associate Professor: [employer]  Tobacco Use   Smoking status: Former    Current packs/day: 0.00    Average packs/day:  1.2 packs/day for 31.7 years (37.6 ttl pk-yrs)    Types: Cigarettes    Start date: 06/27/1960    Quit  date: 06/27/1980    Years since quitting: 44.0    Passive exposure: Past   Smokeless tobacco: Never  Vaping Use   Vaping status: Never Used  Substance and Sexual Activity   Alcohol use: No   Drug use: No   Sexual activity: Not Currently         "

## 2024-07-02 ENCOUNTER — Ambulatory Visit: Admitting: Neuroradiology

## 2024-07-02 DIAGNOSIS — S32050G Wedge compression fracture of fifth lumbar vertebra, subsequent encounter for fracture with delayed healing: Secondary | ICD-10-CM

## 2024-07-02 NOTE — Progress Notes (Addendum)
 I spoke with him on the phone today.  Unfortunately his pain has not improved.  Is also not worse.  This is not unexpected as he has multiple osteoporotic compression fractures, the more acute was an L5.  The relationship of his severe back pain to the L5 fracture was and is uncertain.  I had advised him from the beginning that I was uncertain that vertebral augmentation would alleviate his symptoms  He is to call me back if his pain symptoms become worse or he becomes febrile.  He was at home and I was in my office during the phone call which lasted less than 5 minutes

## 2024-07-07 ENCOUNTER — Ambulatory Visit: Admitting: Internal Medicine

## 2024-07-08 ENCOUNTER — Ambulatory Visit: Admitting: Physician Assistant

## 2024-07-08 ENCOUNTER — Encounter: Payer: Self-pay | Admitting: Physician Assistant

## 2024-07-08 DIAGNOSIS — L97322 Non-pressure chronic ulcer of left ankle with fat layer exposed: Secondary | ICD-10-CM

## 2024-07-08 DIAGNOSIS — M25572 Pain in left ankle and joints of left foot: Secondary | ICD-10-CM | POA: Diagnosis not present

## 2024-07-08 MED ORDER — DOXEPIN HCL 10 MG PO CAPS: 10.0000 mg | ORAL_CAPSULE | Freq: Every evening | ORAL | 1 refills | Status: AC | PRN

## 2024-07-08 NOTE — Progress Notes (Signed)
 "  Office Visit Note   Patient: [name] Fernandez           Date of Birth: 26-Jun-1940           MRN: [id number] Visit Date: 07/08/2024              Requested by: Alvia Corean CROME, FNP [address] [place],  [place] [address] PCP: Alvia Corean CROME, FNP  Chief Complaint  Patient presents with   Left Ankle - Routine Post Op    05/28/2024 I&D left ankle      HPI: 84 y/o male with chronic left ankle wound.  He recently underwent left ankle debridement x 2 with Kerecis application on 05/23/24 and 05/28/24 by Dr. Harden.  We are placing Kerecis mixed with 1 g meropenem  weekly.    He underwent kyphoplasty for vertebral compression fracture prior to this visit and continues to experience significant postprocedural spinal pain without improvement. He has been diagnosed with bladder Ca and is not pursuing further intervention currently.  He does have anxiety and is unable to sleep well due to intense itching.  He was taking Doxepin  in the past and is out.  It did help relieve itching and helped with his anxiety as well.  His previous PCP prescribed it prior to retirement.        Assessment & Plan: Visit Diagnoses:  1. Pain in left ankle and joints of left foot     Plan: The wound bed was cleaned with Vashe and 4 x 4 debridement.  This produced some pin point bleeding and healthy granulation tissue.   Kerecis MariGen micro tissue graft 38 cm2 was applied, and there was no wastage.  The micro tissue graft was covered with a nonadherent Adaptic dressing, bolstered with 4 x 4 gauze and secured with a compression wrap.    I did refill his Doxepin  today for itching and anxiety.     Follow-Up Instructions: Return in about 1 week (around 07/15/2024).   Ortho Exam  Patient is alert, oriented, no adenopathy, well-dressed, normal affect, normal respiratory effort. The wound bed is mixed 50 % granulation with yellow fibrinous tissue.  The wound measures 8 cm x 11.5 cm. X depth of 1  mm.   The surrounding skin is without erythema and minimal edema.  The wound appearence has not changed.     Imaging:   Labs: Lab Results  Component Value Date   HGBA1C 6.2 (H) 09/22/2023   HGBA1C 6.5 (H) 04/10/2023   HGBA1C 6.3 (H) 10/05/2022   ESRSEDRATE 34 (H) 01/15/2023   ESRSEDRATE 32 (H) 06/15/2022   ESRSEDRATE 40 (H) 06/14/2022   CRP 0.7 01/15/2023   CRP 0.8 06/15/2022   CRP <0.5 06/14/2022   LABURIC 7.8 05/20/2014   LABURIC 6.8 05/20/2013   REPTSTATUS 05/30/2024 FINAL 05/23/2024   GRAMSTAIN  05/23/2024    NO WBC SEEN ABUNDANT GRAM NEGATIVE RODS Performed at [facility], [address] [place], [place] [address]    CULT  05/23/2024    ABUNDANT PSEUDOMONAS AERUGINOSA NO ANAEROBES ISOLATED FEW ENTEROCOCCUS FAECALIS    LABORGA PSEUDOMONAS AERUGINOSA 05/23/2024   LABORGA ENTEROCOCCUS FAECALIS 05/23/2024     Lab Results  Component Value Date   ALBUMIN 3.5 06/17/2024   ALBUMIN 2.9 (L) 05/23/2024   ALBUMIN 3.9 09/21/2023   PREALBUMIN 18 06/14/2022    Lab Results  Component Value Date   MG 1.8 04/10/2023   MG 1.8 06/19/2022   MG 1.9 04/04/2022  Lab Results  Component Value Date   VD25OH 45 04/10/2023   VD25OH 57 10/05/2022   VD25OH 59 04/04/2022    Lab Results  Component Value Date   PREALBUMIN 18 06/14/2022      Latest Ref Rng & Units 06/17/2024    3:03 PM 05/23/2024   12:12 PM 12/06/2023    2:24 PM  CBC EXTENDED  WBC 4.0 - 10.5 K/uL 10.6  8.4  7.6   RBC 4.22 - 5.81 MIL/uL 3.22  3.46  3.54   Hemoglobin 13.0 - 17.0 g/dL 89.8  88.8  89.3   HCT 39.0 - 52.0 % 30.0  32.8  33.3   Platelets 150 - 400 K/uL 306  495  541   NEUT# 1.7 - 7.7 K/uL 3.9  5.8    Lymph# 0.7 - 4.0 K/uL 2.1  1.4       There is no height or weight on file to calculate BMI.  Orders:  No orders of the defined types were placed in this encounter.  Meds ordered this encounter  Medications   doxepin  (SINEQUAN ) 10 MG capsule    Sig: Take 1-2 capsules (10-20 mg total)  by mouth at bedtime as needed (itching).    Dispense:  60 capsule    Refill:  1    Supervising Provider:   DUDA, MARCUS V [[id number]]     Procedures: No procedures performed  Clinical Data: No additional findings.  ROS:  All other systems negative, except as noted in the HPI. Review of Systems  Objective: Vital Signs: There were no vitals taken for this visit.  Specialty Comments:  No specialty comments available.  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) 06/16/2022   Osteomyelitis (HCC) 06/16/2022   Tenosynovitis of tibialis posterior tendon 06/15/2022   Wound infection 06/15/2022   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   Anemia 03/13/2019   Chronic venous insufficiency 03/12/2019   CKD (chronic kidney disease) stage 2, 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/2015   COPD (chronic obstructive pulmonary disease) (HCC) 12/08/2015   Chronic pain syndrome 05/26/2015   DDD (degenerative disc disease), lumbar 05/26/2015   Medication management 05/26/2015   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   Lumbago with sciatica 08/06/2014   Hyperlipidemia 08/20/2013   Essential hypertension    Abnormal glucose    Vitamin D  deficiency    Arthritis    Vitamin B12 deficiency    Celiac disease 06/19/2012   GERD (gastroesophageal reflux disease) 04/30/2012   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 tube) procedure 04/30/2012   Hypothyroidism 04/30/2012   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   Past Medical History:   Diagnosis Date   Anemia    Celiac disease    diagnoses 06/24/12   Chronic ulcer of ankle (HCC)    left   COPD (chronic obstructive pulmonary disease) (HCC)    Esophageal stricture    GERD (gastroesophageal reflux disease)    Hiatal hernia    Hypertension    patient states  no current HTN   Hypothyroidism  Pre-diabetes    Prediabetes    Shortness of breath    from oxycodone  at times   Vitamin B12 deficiency    Vitamin D  deficiency     Family History  Problem Relation Age of Onset   Esophageal cancer Father    Cancer Father    Early death Mother     Past Surgical History:  Procedure Laterality Date   APPENDECTOMY     APPLICATION OF WOUND VAC Left 01/12/2023   Procedure: APPLICATION OF WOUND VAC;  Surgeon: Harden Jerona GAILS, MD;  Location: [facility] OR;  Service: Orthopedics;  Laterality: Left;   BACK SURGERY     3 diff surgeries for vertebrea broken   CYSTOSCOPY W/ RETROGRADES Bilateral 12/11/2023   Procedure: CYSTOSCOPY, WITH RETROGRADE PYELOGRAM;  Surgeon: Shane Steffan BROCKS, MD;  Location: [facility];  Service: Urology;  Laterality: Bilateral;   EYE SURGERY     bilateral cataract surgery   HERNIA REPAIR  02/10/1949   bilateral groin as child   HIATAL HERNIA REPAIR     I & D EXTREMITY Left 06/16/2022   Procedure: IRRIGATION AND DEBRIDEMENT ANKLE;  Surgeon: Harden Jerona GAILS, MD;  Location: [facility] OR;  Service: Orthopedics;  Laterality: Left;   I & D EXTREMITY Left 07/21/2022   Procedure: LEFT ANKLE DEBRIDEMENT;  Surgeon: Harden Jerona GAILS, MD;  Location: [facility] OR;  Service: Orthopedics;  Laterality: Left;   I & D EXTREMITY Left 11/10/2022   Procedure: LEFT ANKLE DEBRIDEMENT;  Surgeon: Harden Jerona GAILS, MD;  Location: [facility] OR;  Service: Orthopedics;  Laterality: Left;   I & D EXTREMITY Left 01/12/2023   Procedure: LEFT ANKLE DEBRIDEMENT;  Surgeon: Harden Jerona GAILS, MD;  Location: [facility] OR;  Service: Orthopedics;  Laterality: Left;   INCISION AND DRAINAGE OF DEEP ABSCESS, ANKLE Left 05/23/2024   Procedure:  INCISION AND DRAINAGE OF DEEP ABSCESS, ANKLE;  Surgeon: Harden Jerona GAILS, MD;  Location: [facility] OR;  Service: Orthopedics;  Laterality: Left;  LEFT ANKLE IRRIGATION AND DEBRIDEMENT   INCISION AND DRAINAGE OF DEEP ABSCESS, ANKLE Left 05/28/2024   Procedure: INCISION AND DRAINAGE OF LEFT ANKLE;  Surgeon: Harden Jerona GAILS, MD;  Location: [facility] OR;  Service: Orthopedics;  Laterality: Left;   IR VERTEBROPLASTY LUMBAR BX INC UNI/BIL INC/INJECT/IMAGING  06/30/2024   JOINT REPLACEMENT  04/12/2010   right knee replacement   TONSILLECTOMY     as child   TOTAL KNEE REVISION  11/20/2011   Procedure: TOTAL KNEE REVISION;  Surgeon: Donnice JONETTA Car, MD;  Location: [facility];  Service: Orthopedics;  Laterality: Right;  Right Total Knee Revision   Social History   Occupational History   Occupation: Retired    Associate Professor: [employer]  Tobacco Use   Smoking status: Former    Current packs/day: 0.00    Average packs/day: 1.2 packs/day for 31.7 years (37.6 ttl pk-yrs)    Types: Cigarettes    Start date: 06/27/1960    Quit date: 06/27/1980    Years since quitting: 44.0    Passive exposure: Past   Smokeless tobacco: Never  Vaping Use   Vaping status: Never Used  Substance and Sexual Activity   Alcohol use: No   Drug use: No   Sexual activity: Not Currently       "

## 2024-07-09 NOTE — Progress Notes (Signed)
 We can take over Doxepin .  Thank you!

## 2024-07-22 ENCOUNTER — Encounter: Admitting: Orthopedic Surgery
